# Patient Record
Sex: Female | Born: 1940
Health system: Southern US, Community
[De-identification: ages and names within clinical notes are randomized; demographics above are authoritative.]

## PROBLEM LIST (undated history)

## (undated) DIAGNOSIS — B029 Zoster without complications: Secondary | ICD-10-CM

## (undated) DIAGNOSIS — I509 Heart failure, unspecified: Secondary | ICD-10-CM

## (undated) DIAGNOSIS — Z5189 Encounter for other specified aftercare: Secondary | ICD-10-CM

## (undated) DIAGNOSIS — I872 Venous insufficiency (chronic) (peripheral): Secondary | ICD-10-CM

## (undated) DIAGNOSIS — M479 Spondylosis, unspecified: Secondary | ICD-10-CM

## (undated) DIAGNOSIS — I471 Supraventricular tachycardia: Secondary | ICD-10-CM

## (undated) DIAGNOSIS — D649 Anemia, unspecified: Secondary | ICD-10-CM

## (undated) DIAGNOSIS — I10 Essential (primary) hypertension: Secondary | ICD-10-CM

## (undated) DIAGNOSIS — F29 Unspecified psychosis not due to a substance or known physiological condition: Secondary | ICD-10-CM

## (undated) DIAGNOSIS — A159 Respiratory tuberculosis unspecified: Secondary | ICD-10-CM

## (undated) DIAGNOSIS — I4892 Unspecified atrial flutter: Secondary | ICD-10-CM

## (undated) DIAGNOSIS — N6009 Solitary cyst of unspecified breast: Secondary | ICD-10-CM

## (undated) DIAGNOSIS — Z780 Asymptomatic menopausal state: Secondary | ICD-10-CM

## (undated) DIAGNOSIS — R591 Generalized enlarged lymph nodes: Secondary | ICD-10-CM

## (undated) DIAGNOSIS — Z8619 Personal history of other infectious and parasitic diseases: Secondary | ICD-10-CM

## (undated) DIAGNOSIS — M549 Dorsalgia, unspecified: Secondary | ICD-10-CM

## (undated) DIAGNOSIS — D471 Chronic myeloproliferative disease: Secondary | ICD-10-CM

## (undated) DIAGNOSIS — E669 Obesity, unspecified: Secondary | ICD-10-CM

## (undated) HISTORY — DX: Asymptomatic menopausal state: Z78.0

## (undated) HISTORY — DX: Encounter for other specified aftercare: Z51.89

## (undated) HISTORY — DX: Generalized enlarged lymph nodes: R59.1

## (undated) HISTORY — DX: Venous insufficiency (chronic) (peripheral): I87.2

## (undated) HISTORY — DX: Dorsalgia, unspecified: M54.9

## (undated) HISTORY — DX: Unspecified psychosis not due to a substance or known physiological condition: F29

## (undated) HISTORY — DX: Obesity, unspecified: E66.9

## (undated) HISTORY — DX: Supraventricular tachycardia: I47.1

## (undated) HISTORY — DX: Solitary cyst of unspecified breast: N60.09

## (undated) HISTORY — PX: BREAST CYST ASPIRATION: SHX578

## (undated) HISTORY — DX: Essential (primary) hypertension: I10

## (undated) HISTORY — DX: Heart failure, unspecified: I50.9

## (undated) HISTORY — DX: Respiratory tuberculosis unspecified: A15.9

## (undated) HISTORY — PX: OTHER SURGICAL HISTORY: SHX169

## (undated) HISTORY — DX: Spondylosis, unspecified: M47.9

## (undated) HISTORY — DX: Personal history of other infectious and parasitic diseases: Z86.19

## (undated) HISTORY — DX: Zoster without complications: B02.9

---

## 1998-08-06 HISTORY — PX: OTHER SURGICAL HISTORY: SHX169

## 1998-12-16 ENCOUNTER — Other Ambulatory Visit: Admission: RE | Admit: 1998-12-16 | Discharge: 1999-01-02 | Payer: Self-pay

## 1999-01-24 ENCOUNTER — Encounter: Payer: Self-pay | Admitting: Internal Medicine

## 1999-01-24 ENCOUNTER — Ambulatory Visit (HOSPITAL_COMMUNITY): Admission: RE | Admit: 1999-01-24 | Discharge: 1999-01-24 | Payer: Self-pay | Admitting: Internal Medicine

## 1999-05-03 ENCOUNTER — Encounter: Payer: Self-pay | Admitting: Internal Medicine

## 1999-05-03 ENCOUNTER — Ambulatory Visit (HOSPITAL_COMMUNITY): Admission: RE | Admit: 1999-05-03 | Discharge: 1999-05-03 | Payer: Self-pay | Admitting: Internal Medicine

## 1999-05-18 ENCOUNTER — Emergency Department (HOSPITAL_COMMUNITY): Admission: EM | Admit: 1999-05-18 | Discharge: 1999-05-18 | Payer: Self-pay | Admitting: Emergency Medicine

## 1999-05-23 ENCOUNTER — Ambulatory Visit (HOSPITAL_COMMUNITY): Admission: RE | Admit: 1999-05-23 | Discharge: 1999-05-23 | Payer: Self-pay | Admitting: Internal Medicine

## 1999-07-25 ENCOUNTER — Ambulatory Visit (HOSPITAL_COMMUNITY): Admission: RE | Admit: 1999-07-25 | Discharge: 1999-07-25 | Payer: Self-pay | Admitting: *Deleted

## 1999-07-25 ENCOUNTER — Encounter (INDEPENDENT_AMBULATORY_CARE_PROVIDER_SITE_OTHER): Payer: Self-pay | Admitting: *Deleted

## 1999-07-25 ENCOUNTER — Encounter: Payer: Self-pay | Admitting: *Deleted

## 1999-07-28 ENCOUNTER — Encounter: Payer: Self-pay | Admitting: Neurological Surgery

## 1999-08-01 ENCOUNTER — Encounter: Payer: Self-pay | Admitting: Neurological Surgery

## 1999-08-01 ENCOUNTER — Encounter (INDEPENDENT_AMBULATORY_CARE_PROVIDER_SITE_OTHER): Payer: Self-pay | Admitting: *Deleted

## 1999-08-01 ENCOUNTER — Inpatient Hospital Stay (HOSPITAL_COMMUNITY): Admission: RE | Admit: 1999-08-01 | Discharge: 1999-08-09 | Payer: Self-pay | Admitting: Neurological Surgery

## 1999-08-01 DIAGNOSIS — A1801 Tuberculosis of spine: Secondary | ICD-10-CM | POA: Insufficient documentation

## 1999-09-24 ENCOUNTER — Encounter: Payer: Self-pay | Admitting: Neurological Surgery

## 1999-09-24 ENCOUNTER — Ambulatory Visit (HOSPITAL_COMMUNITY): Admission: RE | Admit: 1999-09-24 | Discharge: 1999-09-24 | Payer: Self-pay | Admitting: Neurological Surgery

## 1999-10-25 ENCOUNTER — Encounter: Admission: RE | Admit: 1999-10-25 | Discharge: 1999-10-25 | Payer: Self-pay | Admitting: Infectious Diseases

## 1999-11-20 ENCOUNTER — Encounter: Payer: Self-pay | Admitting: Infectious Diseases

## 1999-11-20 ENCOUNTER — Encounter: Admission: RE | Admit: 1999-11-20 | Discharge: 1999-11-20 | Payer: Self-pay | Admitting: Infectious Diseases

## 1999-11-22 ENCOUNTER — Encounter: Admission: RE | Admit: 1999-11-22 | Discharge: 1999-11-22 | Payer: Self-pay | Admitting: Infectious Diseases

## 1999-12-07 ENCOUNTER — Ambulatory Visit (HOSPITAL_COMMUNITY): Admission: RE | Admit: 1999-12-07 | Discharge: 1999-12-07 | Payer: Self-pay | Admitting: General Surgery

## 1999-12-07 ENCOUNTER — Encounter: Payer: Self-pay | Admitting: General Surgery

## 1999-12-20 ENCOUNTER — Encounter (INDEPENDENT_AMBULATORY_CARE_PROVIDER_SITE_OTHER): Payer: Self-pay | Admitting: *Deleted

## 1999-12-20 ENCOUNTER — Encounter: Payer: Self-pay | Admitting: General Surgery

## 1999-12-20 ENCOUNTER — Ambulatory Visit (HOSPITAL_COMMUNITY): Admission: RE | Admit: 1999-12-20 | Discharge: 1999-12-20 | Payer: Self-pay | Admitting: General Surgery

## 2000-04-28 ENCOUNTER — Encounter: Payer: Self-pay | Admitting: Neurological Surgery

## 2000-04-28 ENCOUNTER — Ambulatory Visit (HOSPITAL_COMMUNITY): Admission: RE | Admit: 2000-04-28 | Discharge: 2000-04-28 | Payer: Self-pay | Admitting: Neurological Surgery

## 2000-09-18 ENCOUNTER — Encounter: Payer: Self-pay | Admitting: Neurological Surgery

## 2000-09-18 ENCOUNTER — Ambulatory Visit (HOSPITAL_COMMUNITY): Admission: RE | Admit: 2000-09-18 | Discharge: 2000-09-18 | Payer: Self-pay | Admitting: Neurological Surgery

## 2000-11-18 ENCOUNTER — Encounter: Admission: RE | Admit: 2000-11-18 | Discharge: 2000-11-18 | Payer: Self-pay | Admitting: Infectious Diseases

## 2001-01-15 ENCOUNTER — Encounter: Admission: RE | Admit: 2001-01-15 | Discharge: 2001-01-15 | Payer: Self-pay | Admitting: Internal Medicine

## 2001-01-21 ENCOUNTER — Encounter: Admission: RE | Admit: 2001-01-21 | Discharge: 2001-01-21 | Payer: Self-pay | Admitting: Internal Medicine

## 2001-01-29 ENCOUNTER — Encounter: Admission: RE | Admit: 2001-01-29 | Discharge: 2001-01-29 | Payer: Self-pay | Admitting: Internal Medicine

## 2001-05-01 ENCOUNTER — Encounter: Admission: RE | Admit: 2001-05-01 | Discharge: 2001-05-01 | Payer: Self-pay | Admitting: Internal Medicine

## 2001-09-08 ENCOUNTER — Ambulatory Visit (HOSPITAL_COMMUNITY): Admission: RE | Admit: 2001-09-08 | Discharge: 2001-09-08 | Payer: Self-pay | Admitting: Internal Medicine

## 2001-09-08 ENCOUNTER — Encounter: Payer: Self-pay | Admitting: *Deleted

## 2001-09-08 ENCOUNTER — Encounter: Admission: RE | Admit: 2001-09-08 | Discharge: 2001-09-08 | Payer: Self-pay

## 2002-04-03 ENCOUNTER — Encounter: Admission: RE | Admit: 2002-04-03 | Discharge: 2002-04-03 | Payer: Self-pay | Admitting: Internal Medicine

## 2002-04-07 ENCOUNTER — Encounter: Admission: RE | Admit: 2002-04-07 | Discharge: 2002-04-07 | Payer: Self-pay | Admitting: Internal Medicine

## 2002-05-11 ENCOUNTER — Encounter: Admission: RE | Admit: 2002-05-11 | Discharge: 2002-05-11 | Payer: Self-pay | Admitting: Internal Medicine

## 2002-06-10 ENCOUNTER — Encounter: Admission: RE | Admit: 2002-06-10 | Discharge: 2002-09-08 | Payer: Self-pay | Admitting: *Deleted

## 2002-08-15 ENCOUNTER — Emergency Department (HOSPITAL_COMMUNITY): Admission: EM | Admit: 2002-08-15 | Discharge: 2002-08-16 | Payer: Self-pay

## 2002-08-19 ENCOUNTER — Emergency Department (HOSPITAL_COMMUNITY): Admission: EM | Admit: 2002-08-19 | Discharge: 2002-08-19 | Payer: Self-pay | Admitting: Emergency Medicine

## 2002-08-21 ENCOUNTER — Encounter: Admission: RE | Admit: 2002-08-21 | Discharge: 2002-08-21 | Payer: Self-pay | Admitting: Internal Medicine

## 2002-09-03 ENCOUNTER — Encounter: Admission: RE | Admit: 2002-09-03 | Discharge: 2002-09-03 | Payer: Self-pay | Admitting: Internal Medicine

## 2002-09-25 ENCOUNTER — Encounter: Admission: RE | Admit: 2002-09-25 | Discharge: 2002-09-25 | Payer: Self-pay | Admitting: Internal Medicine

## 2002-10-23 ENCOUNTER — Encounter: Admission: RE | Admit: 2002-10-23 | Discharge: 2002-10-23 | Payer: Self-pay | Admitting: Internal Medicine

## 2002-10-27 ENCOUNTER — Encounter: Admission: RE | Admit: 2002-10-27 | Discharge: 2002-10-27 | Payer: Self-pay | Admitting: Internal Medicine

## 2002-11-11 ENCOUNTER — Ambulatory Visit (HOSPITAL_COMMUNITY): Admission: RE | Admit: 2002-11-11 | Discharge: 2002-11-11 | Payer: Self-pay | Admitting: *Deleted

## 2002-11-23 ENCOUNTER — Encounter: Admission: RE | Admit: 2002-11-23 | Discharge: 2002-11-23 | Payer: Self-pay | Admitting: Internal Medicine

## 2002-12-30 ENCOUNTER — Encounter: Admission: RE | Admit: 2002-12-30 | Discharge: 2002-12-30 | Payer: Self-pay | Admitting: Internal Medicine

## 2003-01-06 ENCOUNTER — Encounter: Admission: RE | Admit: 2003-01-06 | Discharge: 2003-01-06 | Payer: Self-pay | Admitting: Internal Medicine

## 2003-04-15 ENCOUNTER — Encounter (INDEPENDENT_AMBULATORY_CARE_PROVIDER_SITE_OTHER): Payer: Self-pay | Admitting: Specialist

## 2003-04-15 ENCOUNTER — Ambulatory Visit (HOSPITAL_COMMUNITY): Admission: RE | Admit: 2003-04-15 | Discharge: 2003-04-15 | Payer: Self-pay | Admitting: Gastroenterology

## 2003-04-15 LAB — HM COLONOSCOPY

## 2003-06-14 ENCOUNTER — Encounter: Admission: RE | Admit: 2003-06-14 | Discharge: 2003-06-14 | Payer: Self-pay | Admitting: Internal Medicine

## 2003-08-19 ENCOUNTER — Encounter: Admission: RE | Admit: 2003-08-19 | Discharge: 2003-08-19 | Payer: Self-pay | Admitting: Internal Medicine

## 2003-10-26 ENCOUNTER — Encounter: Admission: RE | Admit: 2003-10-26 | Discharge: 2003-10-26 | Payer: Self-pay | Admitting: Internal Medicine

## 2003-11-16 ENCOUNTER — Encounter: Admission: RE | Admit: 2003-11-16 | Discharge: 2003-11-16 | Payer: Self-pay | Admitting: Internal Medicine

## 2003-12-08 ENCOUNTER — Ambulatory Visit (HOSPITAL_COMMUNITY): Admission: RE | Admit: 2003-12-08 | Discharge: 2003-12-08 | Payer: Self-pay

## 2004-03-02 ENCOUNTER — Ambulatory Visit (HOSPITAL_COMMUNITY): Admission: RE | Admit: 2004-03-02 | Discharge: 2004-03-02 | Payer: Self-pay | Admitting: Internal Medicine

## 2004-05-25 ENCOUNTER — Ambulatory Visit: Payer: Self-pay | Admitting: Internal Medicine

## 2005-04-11 ENCOUNTER — Ambulatory Visit: Payer: Self-pay | Admitting: Internal Medicine

## 2005-04-13 ENCOUNTER — Ambulatory Visit (HOSPITAL_COMMUNITY): Admission: RE | Admit: 2005-04-13 | Discharge: 2005-04-13 | Payer: Self-pay | Admitting: Internal Medicine

## 2005-06-13 ENCOUNTER — Ambulatory Visit: Payer: Self-pay | Admitting: Internal Medicine

## 2005-07-03 ENCOUNTER — Ambulatory Visit (HOSPITAL_COMMUNITY): Admission: RE | Admit: 2005-07-03 | Discharge: 2005-07-03 | Payer: Self-pay | Admitting: Internal Medicine

## 2005-07-03 ENCOUNTER — Ambulatory Visit: Payer: Self-pay | Admitting: Internal Medicine

## 2005-07-11 ENCOUNTER — Ambulatory Visit: Payer: Self-pay | Admitting: Internal Medicine

## 2006-03-01 ENCOUNTER — Ambulatory Visit: Payer: Self-pay | Admitting: Hospitalist

## 2006-03-06 ENCOUNTER — Ambulatory Visit: Payer: Self-pay | Admitting: Cardiology

## 2006-03-06 ENCOUNTER — Encounter: Payer: Self-pay | Admitting: Cardiology

## 2006-03-06 ENCOUNTER — Ambulatory Visit (HOSPITAL_COMMUNITY): Admission: RE | Admit: 2006-03-06 | Discharge: 2006-03-06 | Payer: Self-pay | Admitting: Internal Medicine

## 2006-05-27 ENCOUNTER — Ambulatory Visit: Payer: Self-pay | Admitting: Internal Medicine

## 2006-06-06 ENCOUNTER — Ambulatory Visit: Payer: Self-pay | Admitting: Internal Medicine

## 2006-06-17 ENCOUNTER — Ambulatory Visit: Payer: Self-pay | Admitting: Internal Medicine

## 2006-07-11 ENCOUNTER — Ambulatory Visit (HOSPITAL_COMMUNITY): Admission: RE | Admit: 2006-07-11 | Discharge: 2006-07-11 | Payer: Self-pay | Admitting: Internal Medicine

## 2006-07-11 ENCOUNTER — Ambulatory Visit: Payer: Self-pay | Admitting: Internal Medicine

## 2006-07-11 ENCOUNTER — Encounter (INDEPENDENT_AMBULATORY_CARE_PROVIDER_SITE_OTHER): Payer: Self-pay | Admitting: Infectious Diseases

## 2006-07-11 LAB — CONVERTED CEMR LAB
AST: 26 units/L (ref 0–37)
Albumin: 3.4 g/dL — ABNORMAL LOW (ref 3.5–5.2)
Alkaline Phosphatase: 97 units/L (ref 39–117)
BUN: 13 mg/dL (ref 6–23)
Chloride: 102 meq/L (ref 96–112)
Creatinine, Ser: 0.9 mg/dL (ref 0.40–1.20)
HCT: 37.1 % (ref 34.4–43.3)
Hemoglobin: 12.4 g/dL (ref 11.7–14.8)
MCV: 92.4 fL (ref 78.8–100.0)
Platelets: 212 10*3/uL (ref 152–374)
Potassium: 4.5 meq/L (ref 3.5–5.3)
Sodium: 136 meq/L (ref 135–145)
Total Protein: 6.9 g/dL (ref 6.0–8.3)
WBC: 4.5 10*3/uL (ref 3.7–10.0)

## 2006-07-18 ENCOUNTER — Ambulatory Visit: Payer: Self-pay | Admitting: *Deleted

## 2006-08-27 DIAGNOSIS — I10 Essential (primary) hypertension: Secondary | ICD-10-CM

## 2006-08-29 DIAGNOSIS — B029 Zoster without complications: Secondary | ICD-10-CM | POA: Insufficient documentation

## 2006-08-29 DIAGNOSIS — N6009 Solitary cyst of unspecified breast: Secondary | ICD-10-CM

## 2006-08-29 DIAGNOSIS — M549 Dorsalgia, unspecified: Secondary | ICD-10-CM | POA: Insufficient documentation

## 2006-09-03 ENCOUNTER — Telehealth: Payer: Self-pay | Admitting: *Deleted

## 2006-10-09 ENCOUNTER — Telehealth (INDEPENDENT_AMBULATORY_CARE_PROVIDER_SITE_OTHER): Payer: Self-pay | Admitting: *Deleted

## 2006-12-01 ENCOUNTER — Emergency Department (HOSPITAL_COMMUNITY): Admission: EM | Admit: 2006-12-01 | Discharge: 2006-12-01 | Payer: Self-pay | Admitting: Emergency Medicine

## 2006-12-05 ENCOUNTER — Ambulatory Visit: Payer: Self-pay | Admitting: Internal Medicine

## 2006-12-05 ENCOUNTER — Encounter (INDEPENDENT_AMBULATORY_CARE_PROVIDER_SITE_OTHER): Payer: Self-pay | Admitting: Unknown Physician Specialty

## 2006-12-05 LAB — CONVERTED CEMR LAB
Albumin: 3.9 g/dL (ref 3.5–5.2)
Alkaline Phosphatase: 83 units/L (ref 39–117)
BUN: 19 mg/dL (ref 6–23)
Cholesterol: 216 mg/dL — ABNORMAL HIGH (ref 0–200)
HDL: 63 mg/dL (ref >39)
Potassium: 4 meq/L (ref 3.5–5.3)
Sodium: 138 meq/L (ref 135–145)
Total Bilirubin: 0.4 mg/dL (ref 0.3–1.2)
Triglycerides: 78 mg/dL (ref <150)

## 2006-12-17 ENCOUNTER — Encounter (INDEPENDENT_AMBULATORY_CARE_PROVIDER_SITE_OTHER): Payer: Self-pay | Admitting: Internal Medicine

## 2006-12-17 ENCOUNTER — Encounter (INDEPENDENT_AMBULATORY_CARE_PROVIDER_SITE_OTHER): Payer: Self-pay | Admitting: Unknown Physician Specialty

## 2006-12-17 ENCOUNTER — Ambulatory Visit (HOSPITAL_COMMUNITY): Admission: RE | Admit: 2006-12-17 | Discharge: 2006-12-17 | Payer: Self-pay | Admitting: Obstetrics & Gynecology

## 2007-01-30 ENCOUNTER — Encounter (INDEPENDENT_AMBULATORY_CARE_PROVIDER_SITE_OTHER): Payer: Self-pay | Admitting: Unknown Physician Specialty

## 2007-01-30 ENCOUNTER — Ambulatory Visit: Payer: Self-pay | Admitting: Internal Medicine

## 2007-02-20 ENCOUNTER — Ambulatory Visit: Payer: Self-pay | Admitting: Internal Medicine

## 2007-02-20 DIAGNOSIS — I8310 Varicose veins of unspecified lower extremity with inflammation: Secondary | ICD-10-CM | POA: Insufficient documentation

## 2007-02-27 ENCOUNTER — Encounter (INDEPENDENT_AMBULATORY_CARE_PROVIDER_SITE_OTHER): Payer: Self-pay | Admitting: Internal Medicine

## 2007-02-27 ENCOUNTER — Encounter: Admission: RE | Admit: 2007-02-27 | Discharge: 2007-03-28 | Payer: Self-pay | Admitting: Internal Medicine

## 2007-04-11 ENCOUNTER — Telehealth: Payer: Self-pay | Admitting: *Deleted

## 2007-05-12 ENCOUNTER — Telehealth: Payer: Self-pay | Admitting: *Deleted

## 2007-05-16 ENCOUNTER — Emergency Department (HOSPITAL_COMMUNITY): Admission: EM | Admit: 2007-05-16 | Discharge: 2007-05-16 | Payer: Self-pay | Admitting: Emergency Medicine

## 2007-05-29 ENCOUNTER — Ambulatory Visit: Payer: Self-pay | Admitting: *Deleted

## 2007-05-29 DIAGNOSIS — M79609 Pain in unspecified limb: Secondary | ICD-10-CM

## 2007-07-16 ENCOUNTER — Telehealth: Payer: Self-pay | Admitting: *Deleted

## 2007-07-29 ENCOUNTER — Emergency Department (HOSPITAL_COMMUNITY): Admission: EM | Admit: 2007-07-29 | Discharge: 2007-07-29 | Payer: Self-pay | Admitting: Family Medicine

## 2007-07-29 ENCOUNTER — Telehealth (INDEPENDENT_AMBULATORY_CARE_PROVIDER_SITE_OTHER): Payer: Self-pay | Admitting: *Deleted

## 2007-08-04 ENCOUNTER — Ambulatory Visit: Payer: Self-pay | Admitting: Internal Medicine

## 2007-08-04 ENCOUNTER — Encounter (INDEPENDENT_AMBULATORY_CARE_PROVIDER_SITE_OTHER): Payer: Self-pay | Admitting: Internal Medicine

## 2007-08-04 DIAGNOSIS — E785 Hyperlipidemia, unspecified: Secondary | ICD-10-CM

## 2007-08-04 DIAGNOSIS — R599 Enlarged lymph nodes, unspecified: Secondary | ICD-10-CM | POA: Insufficient documentation

## 2007-08-14 LAB — CONVERTED CEMR LAB
Alkaline Phosphatase: 89 units/L (ref 39–117)
BUN: 12 mg/dL (ref 6–23)
CO2: 27 meq/L (ref 19–32)
Calcium: 9.4 mg/dL (ref 8.4–10.5)
Chloride: 102 meq/L (ref 96–112)
Creatinine, Ser: 0.86 mg/dL (ref 0.40–1.20)
Glucose, Bld: 87 mg/dL (ref 70–99)
LDL Cholesterol: 109 mg/dL — ABNORMAL HIGH (ref 0–99)
Potassium: 4.2 meq/L (ref 3.5–5.3)
Sodium: 141 meq/L (ref 135–145)
Total CHOL/HDL Ratio: 2.9
Total Protein: 7.5 g/dL (ref 6.0–8.3)

## 2007-09-04 ENCOUNTER — Ambulatory Visit (HOSPITAL_COMMUNITY): Admission: RE | Admit: 2007-09-04 | Discharge: 2007-09-04 | Payer: Self-pay | Admitting: Internal Medicine

## 2007-09-08 ENCOUNTER — Telehealth: Payer: Self-pay | Admitting: *Deleted

## 2007-11-24 ENCOUNTER — Telehealth (INDEPENDENT_AMBULATORY_CARE_PROVIDER_SITE_OTHER): Payer: Self-pay | Admitting: Internal Medicine

## 2007-12-08 ENCOUNTER — Ambulatory Visit: Payer: Self-pay | Admitting: Internal Medicine

## 2007-12-08 DIAGNOSIS — J069 Acute upper respiratory infection, unspecified: Secondary | ICD-10-CM | POA: Insufficient documentation

## 2007-12-12 ENCOUNTER — Emergency Department (HOSPITAL_COMMUNITY): Admission: EM | Admit: 2007-12-12 | Discharge: 2007-12-12 | Payer: Self-pay | Admitting: Emergency Medicine

## 2007-12-15 ENCOUNTER — Ambulatory Visit: Payer: Self-pay | Admitting: Internal Medicine

## 2007-12-15 ENCOUNTER — Encounter (INDEPENDENT_AMBULATORY_CARE_PROVIDER_SITE_OTHER): Payer: Self-pay | Admitting: Internal Medicine

## 2007-12-16 LAB — CONVERTED CEMR LAB
CO2: 30 meq/L (ref 19–32)
Calcium: 8.9 mg/dL (ref 8.4–10.5)
Creatinine, Ser: 0.86 mg/dL (ref 0.40–1.20)
Glucose, Bld: 82 mg/dL (ref 70–99)
Sodium: 142 meq/L (ref 135–145)

## 2008-01-05 DIAGNOSIS — I471 Supraventricular tachycardia: Secondary | ICD-10-CM

## 2008-01-05 HISTORY — DX: Supraventricular tachycardia: I47.1

## 2008-02-02 ENCOUNTER — Ambulatory Visit (HOSPITAL_COMMUNITY): Admission: RE | Admit: 2008-02-02 | Discharge: 2008-02-02 | Payer: Self-pay | Admitting: Internal Medicine

## 2008-02-02 ENCOUNTER — Encounter (INDEPENDENT_AMBULATORY_CARE_PROVIDER_SITE_OTHER): Payer: Self-pay | Admitting: Internal Medicine

## 2008-02-02 ENCOUNTER — Ambulatory Visit: Payer: Self-pay | Admitting: Internal Medicine

## 2008-02-02 DIAGNOSIS — R059 Cough, unspecified: Secondary | ICD-10-CM | POA: Insufficient documentation

## 2008-02-02 DIAGNOSIS — R05 Cough: Secondary | ICD-10-CM

## 2008-02-02 LAB — CONVERTED CEMR LAB
CO2: 29 meq/L (ref 19–32)
Creatinine, Ser: 0.94 mg/dL (ref 0.40–1.20)
HCT: 38.9 % (ref 36.0–46.0)
MCV: 93 fL (ref 78.0–100.0)
Monocytes Absolute: 0.6 10*3/uL (ref 0.1–1.0)
Neutrophils Relative %: 40 % — ABNORMAL LOW (ref 43–77)
Platelets: 175 10*3/uL (ref 150–400)
Potassium: 3.9 meq/L (ref 3.5–5.3)
Sodium: 138 meq/L (ref 135–145)

## 2008-02-03 ENCOUNTER — Inpatient Hospital Stay (HOSPITAL_COMMUNITY): Admission: EM | Admit: 2008-02-03 | Discharge: 2008-02-07 | Payer: Self-pay | Admitting: Family Medicine

## 2008-02-03 ENCOUNTER — Ambulatory Visit: Payer: Self-pay | Admitting: Internal Medicine

## 2008-02-05 ENCOUNTER — Encounter (INDEPENDENT_AMBULATORY_CARE_PROVIDER_SITE_OTHER): Payer: Self-pay | Admitting: Internal Medicine

## 2008-02-10 ENCOUNTER — Ambulatory Visit: Payer: Self-pay | Admitting: *Deleted

## 2008-02-10 DIAGNOSIS — J4540 Moderate persistent asthma, uncomplicated: Secondary | ICD-10-CM

## 2008-02-17 ENCOUNTER — Ambulatory Visit: Payer: Self-pay | Admitting: Internal Medicine

## 2008-02-17 ENCOUNTER — Encounter: Payer: Self-pay | Admitting: Emergency Medicine

## 2008-02-17 ENCOUNTER — Inpatient Hospital Stay (HOSPITAL_COMMUNITY): Admission: EM | Admit: 2008-02-17 | Discharge: 2008-02-21 | Payer: Self-pay | Admitting: Internal Medicine

## 2008-02-25 ENCOUNTER — Encounter (INDEPENDENT_AMBULATORY_CARE_PROVIDER_SITE_OTHER): Payer: Self-pay | Admitting: Internal Medicine

## 2008-02-26 ENCOUNTER — Encounter (INDEPENDENT_AMBULATORY_CARE_PROVIDER_SITE_OTHER): Payer: Self-pay | Admitting: Internal Medicine

## 2008-02-26 ENCOUNTER — Ambulatory Visit: Payer: Self-pay | Admitting: Internal Medicine

## 2008-02-26 ENCOUNTER — Ambulatory Visit (HOSPITAL_COMMUNITY): Admission: RE | Admit: 2008-02-26 | Discharge: 2008-02-26 | Payer: Self-pay | Admitting: Internal Medicine

## 2008-02-26 ENCOUNTER — Encounter (INDEPENDENT_AMBULATORY_CARE_PROVIDER_SITE_OTHER): Payer: Self-pay | Admitting: *Deleted

## 2008-02-26 DIAGNOSIS — E876 Hypokalemia: Secondary | ICD-10-CM

## 2008-02-26 DIAGNOSIS — R000 Tachycardia, unspecified: Secondary | ICD-10-CM

## 2008-02-26 LAB — CONVERTED CEMR LAB
BUN: 8 mg/dL (ref 6–23)
CO2: 28 meq/L (ref 19–32)
Chloride: 102 meq/L (ref 96–112)
Glucose, Bld: 83 mg/dL (ref 70–99)
Magnesium: 2.2 mg/dL (ref 1.5–2.5)
Sodium: 138 meq/L (ref 135–145)

## 2008-03-11 ENCOUNTER — Telehealth (INDEPENDENT_AMBULATORY_CARE_PROVIDER_SITE_OTHER): Payer: Self-pay | Admitting: Internal Medicine

## 2008-03-30 ENCOUNTER — Encounter (INDEPENDENT_AMBULATORY_CARE_PROVIDER_SITE_OTHER): Payer: Self-pay | Admitting: Internal Medicine

## 2008-03-30 ENCOUNTER — Ambulatory Visit: Payer: Self-pay | Admitting: *Deleted

## 2008-03-30 LAB — CONVERTED CEMR LAB
Calcium: 9 mg/dL (ref 8.4–10.5)
Chloride: 104 meq/L (ref 96–112)
Creatinine, Ser: 0.85 mg/dL (ref 0.40–1.20)
Magnesium: 2.1 mg/dL (ref 1.5–2.5)
Sodium: 142 meq/L (ref 135–145)

## 2008-07-27 ENCOUNTER — Telehealth (INDEPENDENT_AMBULATORY_CARE_PROVIDER_SITE_OTHER): Payer: Self-pay | Admitting: Internal Medicine

## 2008-09-08 ENCOUNTER — Encounter (INDEPENDENT_AMBULATORY_CARE_PROVIDER_SITE_OTHER): Payer: Self-pay | Admitting: Internal Medicine

## 2008-12-08 ENCOUNTER — Emergency Department (HOSPITAL_COMMUNITY): Admission: EM | Admit: 2008-12-08 | Discharge: 2008-12-08 | Payer: Self-pay | Admitting: Emergency Medicine

## 2008-12-27 ENCOUNTER — Ambulatory Visit: Payer: Self-pay | Admitting: *Deleted

## 2008-12-27 ENCOUNTER — Encounter (INDEPENDENT_AMBULATORY_CARE_PROVIDER_SITE_OTHER): Payer: Self-pay | Admitting: Internal Medicine

## 2008-12-27 LAB — CONVERTED CEMR LAB
Albumin: 3.5 g/dL (ref 3.5–5.2)
BUN: 14 mg/dL (ref 6–23)
Chloride: 103 meq/L (ref 96–112)
Free T4: 0.93 ng/dL (ref 0.80–1.80)
GFR calc Af Amer: >60 mL/min (ref >60)
Glucose, Bld: 83 mg/dL (ref 70–99)
HCT: 38.1 % (ref 36.0–46.0)
Hemoglobin: 12.2 g/dL (ref 12.0–15.0)
MCHC: 32 g/dL (ref 30.0–36.0)
Platelets: 281 10*3/uL (ref 150–400)
Potassium: 4.2 meq/L (ref 3.5–5.3)
TSH: 1.263 microintl units/mL (ref 0.350–4.500)

## 2009-01-10 ENCOUNTER — Encounter (INDEPENDENT_AMBULATORY_CARE_PROVIDER_SITE_OTHER): Payer: Self-pay | Admitting: Internal Medicine

## 2009-01-10 ENCOUNTER — Ambulatory Visit: Payer: Self-pay | Admitting: Internal Medicine

## 2009-01-11 LAB — CONVERTED CEMR LAB
Cholesterol: 148 mg/dL (ref 0–200)
Triglycerides: 58 mg/dL (ref <150)
VLDL: 12 mg/dL (ref 0–40)

## 2009-01-17 ENCOUNTER — Telehealth: Payer: Self-pay | Admitting: *Deleted

## 2009-01-18 ENCOUNTER — Ambulatory Visit: Payer: Self-pay | Admitting: Internal Medicine

## 2009-01-18 ENCOUNTER — Telehealth: Payer: Self-pay | Admitting: *Deleted

## 2009-01-18 ENCOUNTER — Encounter: Payer: Self-pay | Admitting: Internal Medicine

## 2009-01-18 DIAGNOSIS — J45901 Unspecified asthma with (acute) exacerbation: Secondary | ICD-10-CM | POA: Insufficient documentation

## 2009-01-21 ENCOUNTER — Ambulatory Visit: Payer: Self-pay | Admitting: Internal Medicine

## 2009-01-21 ENCOUNTER — Encounter (INDEPENDENT_AMBULATORY_CARE_PROVIDER_SITE_OTHER): Payer: Self-pay | Admitting: Internal Medicine

## 2009-01-22 ENCOUNTER — Inpatient Hospital Stay (HOSPITAL_COMMUNITY): Admission: EM | Admit: 2009-01-22 | Discharge: 2009-01-24 | Payer: Self-pay | Admitting: Infectious Diseases

## 2009-01-22 ENCOUNTER — Ambulatory Visit: Payer: Self-pay | Admitting: Infectious Diseases

## 2009-01-22 ENCOUNTER — Encounter: Payer: Self-pay | Admitting: Internal Medicine

## 2009-01-22 ENCOUNTER — Encounter: Payer: Self-pay | Admitting: Emergency Medicine

## 2009-01-24 ENCOUNTER — Encounter: Payer: Self-pay | Admitting: Internal Medicine

## 2009-02-08 ENCOUNTER — Ambulatory Visit: Payer: Self-pay | Admitting: Infectious Diseases

## 2009-02-15 ENCOUNTER — Ambulatory Visit (HOSPITAL_COMMUNITY): Admission: RE | Admit: 2009-02-15 | Discharge: 2009-02-15 | Payer: Self-pay | Admitting: Internal Medicine

## 2009-02-15 LAB — HM MAMMOGRAPHY: HM Mammogram: NEGATIVE

## 2009-04-01 ENCOUNTER — Ambulatory Visit: Payer: Self-pay | Admitting: Internal Medicine

## 2009-04-03 DIAGNOSIS — D721 Eosinophilia: Secondary | ICD-10-CM

## 2009-04-03 LAB — CONVERTED CEMR LAB
Alkaline Phosphatase: 106 units/L (ref 39–117)
Basophils Relative: 1 % (ref 0–1)
Eosinophils Relative: 14 % — ABNORMAL HIGH (ref 0–5)
Glucose, Bld: 91 mg/dL (ref 70–99)
Lymphocytes Relative: 27 % (ref 12–46)
Lymphs Abs: 1.5 10*3/uL (ref 0.7–4.0)
MCHC: 31.7 g/dL (ref 30.0–36.0)
Monocytes Absolute: 0.6 10*3/uL (ref 0.1–1.0)
Monocytes Relative: 11 % (ref 3–12)
Neutrophils Relative %: 47 % (ref 43–77)
Potassium: 3.7 meq/L (ref 3.5–5.3)
Sodium: 141 meq/L (ref 135–145)

## 2009-08-19 ENCOUNTER — Telehealth: Payer: Self-pay | Admitting: Internal Medicine

## 2010-01-29 LAB — HM PAP SMEAR: HM Pap smear: NEGATIVE

## 2010-02-23 ENCOUNTER — Telehealth: Payer: Self-pay | Admitting: Internal Medicine

## 2010-04-26 ENCOUNTER — Telehealth: Payer: Self-pay | Admitting: Internal Medicine

## 2010-05-04 ENCOUNTER — Ambulatory Visit: Payer: Self-pay | Admitting: Internal Medicine

## 2010-05-04 ENCOUNTER — Encounter: Payer: Self-pay | Admitting: *Deleted

## 2010-06-02 ENCOUNTER — Telehealth: Payer: Self-pay | Admitting: Internal Medicine

## 2010-09-05 NOTE — Assessment & Plan Note (Signed)
Summary: EST-CK/FU/MEDS/CFB   Vital Signs:  Patient profile:   70 year old female Height:      66 inches Weight:      310.7 pounds BMI:     50.33 Temp:     97.0 degrees F oral Pulse rate:   77 / minute BP sitting:   142 / 88  (right arm)  Vitals Entered By: Filomena Jungling NT II (May 04, 2010 9:59 AM) CC: checkup ? about medication Is Patient Diabetic? No Pain Assessment Patient in pain? no      Nutritional Status BMI of > 30 = obese  Have you ever been in a relationship where you felt threatened, hurt or afraid?No   Does patient need assistance? Functional Status Self care Ambulation Impaired:Risk for fall Comments walks    Primary Care Provider:  Blondell Reveal MD  CC:  checkup ? about medication.  History of Present Illness: Erika [name] is a 70 year old Female with PMH as outlined in the EMR comes to the office for a regular office visit.  1. Patient reports that she is"doing pretty good".   2. She denies any problems currently.  3. Reports that the lipitor causes "cramps".She tried to take it atdifferent timings including bed time but it is still causing cramps, hence she stopped taking it in the last 2 months.      Preventive Screening-Counseling & Management  Alcohol-Tobacco     Smoking Status: never  Caffeine-Diet-Exercise     Does Patient Exercise: yes     Type of exercise: water areobics  Problems Prior to Update: 1)  Eosinophilia  (ICD-288.3) 2)  Asthma  (ICD-493.90) 3)  Stasis Dermatitis  (ICD-454.1) 4)  Hyperlipidemia  (ICD-272.4) 5)  Obesity  (ICD-278.00) 6)  Hypertension  (ICD-401.9) 7)  Asthma, With Acute Exacerbation  (ICD-493.92) 8)  Hypokalemia  (ICD-276.8) 9)  Tachycardia  (ICD-785.0) 10)  Psychosis  (ICD-298.9) 11)  Cough  (ICD-786.2) 12)  Uri  (ICD-465.9) 13)  Tb of Vertebral Column-cult Dx  (ICD-015.04) 14)  Mediastinal Lymphadenopathy  (ICD-785.6) 15)  Foot Pain, Left  (ICD-729.5) 16)  Age At Menopause  (ICD-627.2) 17)   Shingles, Recurrent  (ICD-053.9) 18)  Breast Cyst, Benign  (ICD-610.0) 19)  Back Pain  (ICD-724.5) 20)  Follow-up Examination, Vaginal Pap Smear  (ICD-V67.01)  Medications Prior to Update: 1)  Furosemide 40 Mg Tabs (Furosemide) .... Take 1 Tablet By Mouth Once A Day 2)  Albuterol 90 Mcg/act  Aers (Albuterol) .... Inhale 1-2 Puffs Every 4 Hours As Needed For Shortness of Breath, Cough or Wheezing 3)  Lipitor 20 Mg Tabs (Atorvastatin Calcium) .... Take 1 Tablet By Mouth Once A Day 4)  Klor-Con 20 Meq  Pack (Potassium Chloride) .... Take 1 Tablet By Mouth Two Times A Day 5)  Omeprazole 20 Mg  Cpdr (Omeprazole) .... Take 1 Tablet By Mouth Once A Day 6)  Amlodipine Besylate 10 Mg Tabs (Amlodipine Besylate) .... Take One Tab Daily 7)  Afrin Nasal Spray 0.05 % Soln (Oxymetazoline Hcl) .... Spray Once in Each Nare Two Times A Day As Needed For Sinus Congestion - Do Not Use For More Than 5 Days 8)  Advair Diskus 250-50 Mcg/dose Misc (Fluticasone-Salmeterol) .[name].. 1 Puff Twice Daily As Instructed 9)  Atenolol 25 Mg Tabs (Atenolol) .... Take 1 Tablet By Mouth Once A Day 10)  Doxycycline Hyclate 100 Mg Caps (Doxycycline Hyclate) .... Take 1 Tablet By Mouth Two Times A Day For Ten Days 11)  Hydroxyzine Hcl 25 Mg Tabs (  Hydroxyzine Hcl) .... Take 1 Tablet By Mouth Four Times A Day For Itching  Current Medications (verified): 1)  Furosemide 40 Mg Tabs (Furosemide) .... Take 1 Tablet By Mouth Once A Day 2)  Albuterol 90 Mcg/act  Aers (Albuterol) .... Inhale 1-2 Puffs Every 4 Hours As Needed For Shortness of Breath, Cough or Wheezing 3)  Klor-Con 20 Meq  Pack (Potassium Chloride) .... Take 1 Tablet By Mouth Once A Day 4)  Omeprazole 20 Mg  Cpdr (Omeprazole) .... Take 1 Tablet By Mouth Once A Day 5)  Amlodipine Besylate 10 Mg Tabs (Amlodipine Besylate) .... Take One Tab Daily 6)  Advair Diskus 250-50 Mcg/dose Misc (Fluticasone-Salmeterol) .[name].. 1 Puff Twice Daily As Instructed  Allergies (verified): 1)  !  Prednisone  Social History: Does Patient Exercise:  yes  Review of Systems      See HPI  Physical Exam  General:  alert, well-developed, and well-nourished.   Head:  normocephalic and atraumatic.   Eyes:  vision grossly intact.   Neck:  supple and full ROM.   Lungs:  normal respiratory effort, no intercostal retractions, no accessory muscle use, and normal breath sounds.   Heart:  normal rate, regular rhythm, no murmur, and no gallop.   Extremities:  1+ left pedal edema and 1+ right pedal edema.   Neurologic:  alert & oriented X3, cranial nerves II-XII intact, and strength normal in all extremities.     Impression & Recommendations:  Problem # 1:  ASTHMA (ICD-493.90) Currently well controlled. Continue current regimen.  Her updated medication list for this problem includes:    Albuterol 90 Mcg/act Aers (Albuterol) ..... Inhale 1-2 puffs every 4 hours as needed for shortness of breath, cough or wheezing    Advair Diskus 250-50 Mcg/dose Misc (Fluticasone-salmeterol) .[name]... 1 puff twice daily as instructed  Problem # 2:  HYPERLIPIDEMIA (ICD-272.4) Patient reports that she has been having "cramps" every time she takes Lipitor. I gave her few options in regards to less potent statins but patient prefers to control with diet and life style changes. Will stop Lipitor.  The following medications were removed from the medication list:    Lipitor 20 Mg Tabs (Atorvastatin calcium) .[name]... Take 1 tablet by mouth once a day  Problem # 3:  HYPERTENSION (ICD-401.9)  Blood pressure fairly well controlled. Patient hasn't been taking Lasix for a few months now. Given her pedal edema, asked her to restart Lasix along with KCL.  The following medications were removed from the medication list:    Atenolol 25 Mg Tabs (Atenolol) .[name]... Take 1 tablet by mouth once a day Her updated medication list for this problem includes:    Furosemide 40 Mg Tabs (Furosemide) .[name]... Take 1 tablet by mouth once a day     Amlodipine Besylate 10 Mg Tabs (Amlodipine besylate) .[name]... Take one tab daily  BP today: 142/88 Prior BP: 140/95 (04/01/2009)  Prior 10 Yr Risk Heart Disease: Not enough information (12/05/2006)  Labs Reviewed: K+: 3.7 (04/01/2009) Creat: : 1.05 (04/01/2009)   Chol: 148 (01/10/2009)   HDL: 69 (01/10/2009)   LDL: 67 (01/10/2009)   TG: 58 (01/10/2009)  Problem # 4:  Preventive Health Care (ICD-V70.0) Patient reports having a colonoscopy in about 3 yrs ago.  Complete Medication List: 1)  Furosemide 40 Mg Tabs (Furosemide) .... Take 1 tablet by mouth once a day 2)  Albuterol 90 Mcg/act Aers (Albuterol) .... Inhale 1-2 puffs every 4 hours as needed for shortness of breath, cough or wheezing 3)  Klor-con 20 Meq Pack (Potassium chloride) .... Take 1 tablet by mouth once a day 4)  Omeprazole 20 Mg Cpdr (Omeprazole) .... Take 1 tablet by mouth once a day 5)  Amlodipine Besylate 10 Mg Tabs (Amlodipine besylate) .... Take one tab daily 6)  Advair Diskus 250-50 Mcg/dose Misc (Fluticasone-salmeterol) .[name].. 1 puff twice daily as instructed  Patient Instructions: 1)  Please schedule a follow-up appointment in 2 months. Prescriptions: FUROSEMIDE 40 MG TABS (FUROSEMIDE) Take 1 tablet by mouth once a day  #31 Tablet x 3   Entered and Authorized by:   Blondell Reveal MD   Signed by:   Blondell Reveal MD on 05/04/2010   Method used:   Electronically to        [facility] (retail)       [address]       [place], [place]  [id number]       Ph: [phone]       Fax: [phone]   RxID:   [id number] KLOR-CON 20 MEQ  PACK (POTASSIUM CHLORIDE) Take 1 tablet by mouth once a day  #30 x 3   Entered and Authorized by:   Blondell Reveal MD   Signed by:   Blondell Reveal MD on 05/04/2010   Method used:   Electronically to        [facility] (retail)       [address]       [place], [place]  [id number]       Ph: [phone]       Fax: [phone]    RxID:   [id number]   Prevention & Chronic Care Immunizations   Influenza vaccine: Fluvax MCR  (08/04/2007)    Tetanus booster: Not documented    Pneumococcal vaccine: Pneumovax  (02/04/2008)   Pneumococcal vaccine deferral: Not indicated  (02/08/2009)    H. zoster vaccine: Not documented  Colorectal Screening   Hemoccult: Not documented    Colonoscopy: Results: Polyp.  Pathology:  Adenomatous polyp.          (04/15/2003)  Other Screening   Pap smear: Not documented    Mammogram: ASSESSMENT: Negative - BI-RADS 1^MM DIGITAL SCREENING  (02/15/2009)   Mammogram action/deferral: Ordered  (02/08/2009)    DXA bone density scan: Lumbar Spine:  T Score > -1.0 Spine.  Hip Total: T Score > -1.0 Hip.    (12/17/2006)   DXA scan due: 12/2008    Smoking status: never  (05/04/2010)  Lipids   Total Cholesterol: 148  (01/10/2009)   LDL: 67  (01/10/2009)   LDL Direct: Not documented   HDL: 69  (01/10/2009)   Triglycerides: 58  (01/10/2009)    SGOT (AST): 16  (04/01/2009)   SGPT (ALT): 11  (04/01/2009)   Alkaline phosphatase: 106  (04/01/2009)   Total bilirubin: 0.4  (04/01/2009)  Hypertension   Last Blood Pressure: 142 / 88  (05/04/2010)   Serum creatinine: 1.05  (04/01/2009)   BMP action: Ordered   Serum potassium 3.7  (04/01/2009)  Self-Management Support :    Patient will work on the following items until the next clinic visit to reach self-care goals:     Medications and monitoring: take my medicines every day  (05/04/2010)     Eating: eat more vegetables, eat foods that are low in salt, eat baked foods instead of fried foods, eat fruit for snacks and desserts  (05/04/2010)     Activity: join a walking program  (05/04/2010)  Other: water arobics  (05/04/2010)    Hypertension self-management support: Education handout  (05/04/2010)   Hypertension education handout printed    Lipid self-management support: Education handout  (05/04/2010)     Lipid education  handout printed

## 2010-09-05 NOTE — Miscellaneous (Signed)
Summary: Orders Update  Clinical Lists Changes  Orders: Added new Service order of Flu Vaccine 72yrs + MEDICARE PATIENTS (Z6109) - Signed Added new Service order of Administration Flu vaccine - MCR (U0454) - Signed Observations: Added new observation of FLU VAXMFR: [employer] (05/04/2010 11:30) Added new observation of FLU VAXLOT: [id number] (05/04/2010 11:30) Added new observation of FLU VAX VIS: 02/28/2010 version (05/04/2010 11:30) Added new observation of FLU VAX EXP: 02/03/2011 (05/04/2010 11:30) Added new observation of FLU VAX DSE: 0.37ml (05/04/2010 11:30) Added new observation of FLU VAX: Fluvax 3+ (05/04/2010 11:30) Flu Vaccine Consent Questions     Do you have a history of severe allergic reactions to this vaccine? no    Any prior history of allergic reactions to egg and/or gelatin? no    Do you have a sensitivity to the preservative Thimersol? no    Do you have a past history of Guillan-Barre Syndrome? no    Do you currently have an acute febrile illness? no    Have you ever had a severe reaction to latex? no    Vaccine information given and explained to patient? yes    Are you currently pregnant? no    Lot Number:[id number]   Exp Date:02/03/2011   Manufacturer: [employer]    Site Given  Left Deltoid IM  Orders Added: 1)  Flu Vaccine 68yrs + MEDICARE PATIENTS [Q2039] 2)  Administration Flu vaccine - MCR [G0008]  .medflu

## 2010-09-05 NOTE — Progress Notes (Signed)
Summary: Refill/gh  Phone Note Refill Request Message from:  Fax from Pharmacy on February 23, 2010 4:55 PM  Refills Requested: Medication #1:  ADVAIR DISKUS 250-50 MCG/DOSE MISC 1 puff twice daily as instructed   Last Refilled: 11/09/2009  Method Requested: Electronic Initial call taken by: Angelina Ok RN,  February 23, 2010 4:55 PM  Follow-up for Phone Call       Follow-up by: Blondell Reveal MD,  February 25, 2010 5:43 PM    Prescriptions: ADVAIR DISKUS 250-50 MCG/DOSE MISC (FLUTICASONE-SALMETEROL) 1 puff twice daily as instructed  #1 x 6   Entered and Authorized by:   Blondell Reveal MD   Signed by:   Blondell Reveal MD on 02/25/2010   Method used:   Electronically to        [facility] (retail)       [address]       [place], [place]  [id number]       Ph: [phone]       Fax: [phone]   RxID:   [id number]

## 2010-09-05 NOTE — Progress Notes (Signed)
Summary: refill/gg  Phone Note Refill Request  on April 26, 2010 12:24 PM  Refills Requested: Medication #1:  LIPITOR 20 MG TABS Take 1 tablet by mouth once a day   Last Refilled: 03/15/2010  Method Requested: Electronic Initial call taken by: Merrie Roof RN,  April 26, 2010 12:25 PM  Follow-up for Phone Call        Refill approved-nurse to complete Follow-up by: Julaine Fusi  DO,  April 28, 2010 5:03 PM    Prescriptions: LIPITOR 20 MG TABS (ATORVASTATIN CALCIUM) Take 1 tablet by mouth once a day  #30 Tablet x 4   Entered by:   Julaine Fusi  DO   Authorized by:   Blondell Reveal MD   Signed by:   Julaine Fusi  DO on 04/28/2010   Method used:   Electronically to        [facility] (retail)       [address]       [place], [place]  [id number]       Ph: [phone]       Fax: [phone]   RxID:   [id number]

## 2010-09-05 NOTE — Progress Notes (Signed)
Summary: med refill/gp  Phone Note Refill Request Message from:  Fax from Pharmacy on June 02, 2010 10:31 AM  Refills Requested: Medication #1:  OMEPRAZOLE 20 MG  CPDR Take 1 tablet by mouth once a day   Last Refilled: 04/22/2010 Last appt. 05/04/10.   Method Requested: Electronic Initial call taken by: Chinita Pester RN,  June 02, 2010 10:31 AM  Follow-up for Phone Call       Follow-up by: Blondell Reveal MD,  June 02, 2010 5:15 PM    Prescriptions: OMEPRAZOLE 20 MG  CPDR (OMEPRAZOLE) Take 1 tablet by mouth once a day  #30 Capsule x 4   Entered and Authorized by:   Blondell Reveal MD   Signed by:   Blondell Reveal MD on 06/02/2010   Method used:   Electronically to        [facility] (retail)       [address]       [place], [place]  [id number]       Ph: [phone]       Fax: [phone]   RxID:   [id number]

## 2010-09-05 NOTE — Progress Notes (Signed)
Summary: med refill/gp  Phone Note Refill Request Message from:  Fax from Pharmacy on August 19, 2009 11:16 AM  Refills Requested: Medication #1:  ATENOLOL 25 MG TABS Take 1 tablet by mouth once a day   Last Refilled: 07/14/2009  Method Requested: Electronic Initial call taken by: Chinita Pester RN,  August 19, 2009 11:16 AM  Follow-up for Phone Call       Follow-up by: Blondell Reveal MD,  August 19, 2009 12:39 PM    Prescriptions: ATENOLOL 25 MG TABS (ATENOLOL) Take 1 tablet by mouth once a day  #30 x 5   Entered and Authorized by:   Blondell Reveal MD   Signed by:   Blondell Reveal MD on 08/19/2009   Method used:   Electronically to        [facility] (retail)       [address]       [place], [place]  [id number]       Ph: [phone]       Fax: [phone]   RxID:   [id number]

## 2010-09-11 ENCOUNTER — Ambulatory Visit (INDEPENDENT_AMBULATORY_CARE_PROVIDER_SITE_OTHER): Payer: MEDICARE | Admitting: Internal Medicine

## 2010-09-11 ENCOUNTER — Encounter: Payer: Self-pay | Admitting: Internal Medicine

## 2010-09-11 ENCOUNTER — Ambulatory Visit: Payer: Self-pay | Admitting: Internal Medicine

## 2010-09-11 ENCOUNTER — Other Ambulatory Visit: Payer: Self-pay | Admitting: Internal Medicine

## 2010-09-11 ENCOUNTER — Telehealth: Payer: Self-pay | Admitting: *Deleted

## 2010-09-11 DIAGNOSIS — I10 Essential (primary) hypertension: Secondary | ICD-10-CM

## 2010-09-11 MED ORDER — TRIAMTERENE-HCTZ 75-50 MG PO TABS: ORAL_TABLET | ORAL | Status: DC

## 2010-09-11 NOTE — Assessment & Plan Note (Signed)
Discontinue furosemide  Trial of triamterene-hctz 75-50 1/2 tab a day  Patient advised to stop kcl  Supplements as well

## 2010-09-11 NOTE — Progress Notes (Signed)
  Subjective:    Patient ID: Erika [name], female    DOB: 06/22/1941, 71 y.o.   MRN: [id number]  HPI  Pt here fu for problems listed. Notes some occasions of sinus difficulties, takes otc med with good relief No associated fevers.   Not taking furosemide regularly b/c of cramping in hands. Similar sx's hctz but has not tried hctz\triamterene.  Was going to [facility] up until one month ago water aerobics, but slacked off. She promises me she will return to her workouts tomorrow!    Review of Systems  Constitutional: Negative for fever, activity change, fatigue and unexpected weight change.  HENT: Positive for congestion (episodes of pm congestion-no feather pillow, air vents other triggers). Negative for rhinorrhea, sneezing and postnasal drip.   Eyes: Negative for discharge and itching.  Respiratory: Negative for cough and chest tightness.   Musculoskeletal: Positive for back pain (hx of spinal TB with post infectious arthritis-stable).       Objective:   Physical Exam  Constitutional: She appears well-developed and well-nourished.  HENT:  Head: Normocephalic and atraumatic.  Eyes: Conjunctivae and EOM are normal. Pupils are equal, round, and reactive to light.  Neck: Normal range of motion. Neck supple. No tracheal deviation present. No thyromegaly present.  Cardiovascular: Normal rate, regular rhythm, normal heart sounds and intact distal pulses.   Pulmonary/Chest: Effort normal. No respiratory distress. She has no wheezes. She has no rales. She exhibits no tenderness.  Abdominal: Soft. Bowel sounds are normal.  Skin: Skin is warm. No rash noted. No erythema.  Psychiatric: She has a normal mood and affect. Her behavior is normal.          Assessment & Plan:

## 2010-09-11 NOTE — Telephone Encounter (Signed)
Accidentally opened in error.

## 2010-09-12 LAB — COMPREHENSIVE METABOLIC PANEL
AST: 19 U/L (ref 0–37)
Albumin: 3.7 g/dL (ref 3.5–5.2)
BUN: 13 mg/dL (ref 6–23)
CO2: 28 mEq/L (ref 19–32)
Chloride: 104 mEq/L (ref 96–112)
Creat: 0.82 mg/dL (ref 0.40–1.20)
Glucose, Bld: 81 mg/dL (ref 70–99)
Potassium: 4 mEq/L (ref 3.5–5.3)
Total Bilirubin: 0.4 mg/dL (ref 0.3–1.2)

## 2010-10-03 ENCOUNTER — Other Ambulatory Visit: Payer: Self-pay | Admitting: *Deleted

## 2010-10-04 MED ORDER — AMLODIPINE BESYLATE 10 MG PO TABS: 10.0000 mg | ORAL_TABLET | Freq: Every day | ORAL | Status: DC

## 2010-10-10 ENCOUNTER — Encounter: Payer: Self-pay | Admitting: Internal Medicine

## 2010-10-10 ENCOUNTER — Ambulatory Visit (INDEPENDENT_AMBULATORY_CARE_PROVIDER_SITE_OTHER): Payer: MEDICARE | Admitting: Internal Medicine

## 2010-10-10 DIAGNOSIS — E785 Hyperlipidemia, unspecified: Secondary | ICD-10-CM

## 2010-10-10 DIAGNOSIS — I10 Essential (primary) hypertension: Secondary | ICD-10-CM

## 2010-10-10 DIAGNOSIS — J45909 Unspecified asthma, uncomplicated: Secondary | ICD-10-CM

## 2010-10-10 DIAGNOSIS — Z0000 Encounter for general adult medical examination without abnormal findings: Secondary | ICD-10-CM

## 2010-10-10 DIAGNOSIS — Z1231 Encounter for screening mammogram for malignant neoplasm of breast: Secondary | ICD-10-CM

## 2010-10-10 LAB — BASIC METABOLIC PANEL
Chloride: 102 mEq/L (ref 96–112)
Potassium: 3.9 mEq/L (ref 3.5–5.3)
Sodium: 138 mEq/L (ref 135–145)

## 2010-10-10 LAB — LIPID PANEL
HDL: 68 mg/dL (ref >39)
LDL Cholesterol: 112 mg/dL — ABNORMAL HIGH (ref 0–99)
VLDL: 16 mg/dL (ref 0–40)

## 2010-10-10 NOTE — Assessment & Plan Note (Signed)
Stable  Continue current regimen  

## 2010-10-10 NOTE — Assessment & Plan Note (Signed)
At goal as of last checked 2 yrs ago. Will recheck FLP today and reassess.

## 2010-10-10 NOTE — Patient Instructions (Signed)
Make follow up appointment in 2 months. Continue taking all medication as directed. You will be called with any abnormalities in the tests scheduled or performed today.  If you don't hear from [place] within a week from when the test was performed, you can assume that your test was normal. Go get Mammogram and Pap smear done.

## 2010-10-10 NOTE — Progress Notes (Signed)
  Subjective:    Patient ID: Erika [name], female    DOB: 29-Apr-1941, 70 y.o.   MRN: [id number]  HPI  70 yr old woman  Past Medical History  Diagnosis Date  . Hypertension   . Tuberculosis     active TB treated in 2002  . Obesity   . Menopause   . Breast cyst   . Shingles   . Asthma   . SVT (supraventricular tachycardia) June 2009    one run while hospitalized  . Back pain     status post surgery 2002   comes to the clinic for hypertension management. Reports to have started taking norvasc 10mg  just a couple of days ago. She is taking maxzide 1/2 tablet po daily. Patient has no complains.    Review of Systems  [all other systems reviewed and are negative       Objective:   Physical Exam  [vitalsreviewed. Constitutional: She appears well-developed and well-nourished.  HENT:  Head: Normocephalic and atraumatic.  Eyes: Conjunctivae and EOM are normal. Pupils are equal, round, and reactive to light.  Neck: Normal range of motion. Neck supple. No tracheal deviation present. No thyromegaly present.  Cardiovascular: Normal rate, regular rhythm, normal heart sounds and intact distal pulses.   Pulmonary/Chest: Effort normal. No respiratory distress. She has no wheezes. She has no rales. She exhibits no tenderness.  Abdominal: Soft. Bowel sounds are normal.  Skin: Skin is warm. No rash noted. No erythema.  Psychiatric: She has a normal mood and affect. Her behavior is normal.          Assessment & Plan:

## 2010-10-10 NOTE — Assessment & Plan Note (Signed)
At goal. Will continue current regimen. Check bmet as diuretic was changed 1 month ago.

## 2010-10-10 NOTE — Assessment & Plan Note (Signed)
Pap smear and Mammogram ordered today. Will get records.

## 2010-10-31 ENCOUNTER — Other Ambulatory Visit: Payer: Self-pay | Admitting: *Deleted

## 2010-11-01 MED ORDER — OMEPRAZOLE 20 MG PO CPDR: 20.0000 mg | DELAYED_RELEASE_CAPSULE | Freq: Every day | ORAL | Status: DC

## 2010-11-13 LAB — GLUCOSE, CAPILLARY
Glucose-Capillary: 133 mg/dL — ABNORMAL HIGH (ref 70–99)
Glucose-Capillary: 137 mg/dL — ABNORMAL HIGH (ref 70–99)
Glucose-Capillary: 141 mg/dL — ABNORMAL HIGH (ref 70–99)

## 2010-11-13 LAB — BASIC METABOLIC PANEL
BUN: 11 mg/dL (ref 6–23)
BUN: 14 mg/dL (ref 6–23)
CO2: 31 mEq/L (ref 19–32)
Chloride: 93 mEq/L — ABNORMAL LOW (ref 96–112)
Chloride: 99 mEq/L (ref 96–112)
Creatinine, Ser: 0.78 mg/dL (ref 0.4–1.2)
GFR calc Af Amer: >60 mL/min (ref >60)
GFR calc non Af Amer: >60 mL/min (ref >60)
GFR calc non Af Amer: >60 mL/min (ref >60)
Glucose, Bld: 126 mg/dL — ABNORMAL HIGH (ref 70–99)
Potassium: 3.5 mEq/L (ref 3.5–5.1)
Potassium: 3.9 mEq/L (ref 3.5–5.1)

## 2010-11-13 LAB — BLOOD GAS, ARTERIAL
Drawn by: 308601
FIO2: 0.21 %
O2 Saturation: 92.1 %
Patient temperature: 98.6
pH, Arterial: 7.396 (ref 7.350–7.400)
pO2, Arterial: 63.9 mmHg — ABNORMAL LOW (ref 80.0–100.0)

## 2010-11-13 LAB — POCT I-STAT, CHEM 8
BUN: 14 mg/dL (ref 6–23)
Calcium, Ion: 0.99 mmol/L — ABNORMAL LOW (ref 1.12–1.32)
Chloride: 100 meq/L (ref 96–112)
Creatinine, Ser: 0.8 mg/dL (ref 0.4–1.2)
Glucose, Bld: 116 mg/dL — ABNORMAL HIGH (ref 70–99)
HCT: 40 % (ref 36.0–46.0)
Hemoglobin: 13.6 g/dL (ref 12.0–15.0)
Potassium: 3.5 mEq/L (ref 3.5–5.1)
Sodium: 135 meq/L (ref 135–145)
TCO2: 29 mmol/L (ref 0–100)

## 2010-11-13 LAB — URINALYSIS, ROUTINE W REFLEX MICROSCOPIC
Glucose, UA: NEGATIVE mg/dL
Ketones, ur: NEGATIVE mg/dL
Protein, ur: NEGATIVE mg/dL
pH: 5.5 (ref 5.0–8.0)

## 2010-11-13 LAB — MAGNESIUM: Magnesium: 2.3 mg/dL (ref 1.5–2.5)

## 2010-11-13 LAB — DIFFERENTIAL
Basophils Absolute: 0 10*3/uL (ref 0.0–0.1)
Eosinophils Relative: 18 % — ABNORMAL HIGH (ref 0–5)
Lymphocytes Relative: 17 % (ref 12–46)
Neutro Abs: 4.8 10*3/uL (ref 1.7–7.7)
Neutrophils Relative %: 58 % (ref 43–77)

## 2010-11-13 LAB — EXPECTORATED SPUTUM ASSESSMENT W GRAM STAIN, RFLX TO RESP C

## 2010-11-13 LAB — CULTURE, RESPIRATORY W GRAM STAIN: Culture: NORMAL

## 2010-11-13 LAB — POCT CARDIAC MARKERS
CKMB, poc: 3 ng/mL (ref 1.0–8.0)
Myoglobin, poc: 225 ng/mL (ref 12–200)
Troponin i, poc: <0.05 ng/mL (ref 0.00–0.09)

## 2010-11-13 LAB — CBC
HCT: 38.6 % (ref 36.0–46.0)
HCT: 39.1 % (ref 36.0–46.0)
MCHC: 33 g/dL (ref 30.0–36.0)
MCV: 93.7 fL (ref 78.0–100.0)
MCV: 93.8 fL (ref 78.0–100.0)
Platelets: 195 10*3/uL (ref 150–400)
Platelets: 189 10*3/uL (ref 150–400)
Platelets: 198 10*3/uL (ref 150–400)
RBC: 3.98 MIL/uL (ref 3.87–5.11)
RDW: 14.6 % (ref 11.5–15.5)
RDW: 15 % (ref 11.5–15.5)
WBC: 11.9 10*3/uL — ABNORMAL HIGH (ref 4.0–10.5)

## 2010-11-13 LAB — D-DIMER, QUANTITATIVE: D-Dimer, Quant: 0.29 ug/mL-FEU (ref 0.00–0.48)

## 2010-11-13 LAB — CARDIAC PANEL(CRET KIN+CKTOT+MB+TROPI)
CK, MB: 5.4 ng/mL — ABNORMAL HIGH (ref 0.3–4.0)
Total CK: 226 U/L — ABNORMAL HIGH (ref 7–177)
Total CK: 261 U/L — ABNORMAL HIGH (ref 7–177)
Troponin I: 0.02 ng/mL (ref 0.00–0.06)
Troponin I: 0.02 ng/mL (ref 0.00–0.06)

## 2010-11-13 LAB — HEPATIC FUNCTION PANEL
Bilirubin, Direct: <0.1 mg/dL (ref 0.0–0.3)
Total Bilirubin: 0.6 mg/dL (ref 0.3–1.2)

## 2010-11-13 LAB — BRAIN NATRIURETIC PEPTIDE: Pro B Natriuretic peptide (BNP): <30 pg/mL (ref 0.0–100.0)

## 2010-11-25 ENCOUNTER — Encounter: Payer: Self-pay | Admitting: Ophthalmology

## 2010-12-19 NOTE — Consult Note (Signed)
[name], Erika [name] NO.:  [id number]   MEDICAL RECORD NO.:  [id number]          PATIENT TYPE:  INP   LOCATION:  4730                         FACILITY:  [facility]   PHYSICIAN:  [name], M.D.  DATE OF BIRTH:  10/02/1940   DATE OF CONSULTATION:  02/19/2008  DATE OF DISCHARGE:                                 CONSULTATION   REQUESTING PHYSICIAN:  Alvester Morin, M.D.   REASON FOR CONSULTATION:  Manic symptoms.   HISTORY OF PRESENT ILLNESS:  Erika [name] is a 70 year old female  admitted to [facility] on February 17, 2008, with  psychosis and agitation.   The patient has had approximately 4 days of progressive elevated and  expansive mood, starting with feeling great, spreading love to the  point of severe insomnia increased energy, paranoia, agitation and  combativeness.  The patient was swinging her cane and threatening.   An hour prior to the undersigned's visit, the patient was given Haldol 2  mg IM.  She is calmer at this time.  She still has paranoia and anxiety.   Most recently, the patient required a prednisone taper in early July for  an asthmatic exacerbation.  The taper started at 60 mg daily.   PAST PSYCHIATRIC HISTORY:  The undersigned reviewed the past medical  record.  The only psychotropic discovered was Valium utilized in  December 2000.   The undersigned interviewed the patient's son.  He does not know of any  prior history of elevated mood or agitation, psychosis, depression or  any other mental disorders.  He also does not know of any psychotropic  treatment or psychiatric treatment.   FAMILY PSYCHIATRIC HISTORY:  He states that he is in recovery from  substance addiction (the patient's son).   SOCIAL HISTORY:  The patient is from [place], [place].  Her  son lives with her.  She does not use alcohol or illegal drugs.  She  does attend church.  She has three grown children and is widowed.  Occupation:  Retired.   PAST MEDICAL HISTORY:  1. Asthma.  2. Hypertension.  3. Hyperlipidemia.  4. History of tuberculosis in the spine.   MEDICATIONS:  The MAR is reviewed.  The patient is on Haldol 2 mg q.4 h  p.r.n.   ALLERGIES:  No known drug allergies.   LABORATORY DATA:  Sodium 138, BUN 12, creatinine 0.9, glucose 113.  TSH,  B12, urine drug screen, folic acid, magnesium, alcohol all within normal  limits.   WBC 6.9, hemoglobin 11, platelet count 183.   Head CT without contrast showed no acute abnormalities.   EKG:  QTC showed 482 milliseconds.   REVIEW OF SYSTEMS:  Constitutional, head, eyes, ears, nose, throat,  mouth neurologic, psychiatric, cardiovascular, respiratory,  gastrointestinal, genitourinary, skin, musculoskeletal, hematologic,  lymphatic, endocrine, metabolic all unremarkable.   PHYSICAL EXAMINATION:  VITALS:  Temperature 97.6, pulse 99, respiratory  rate 18, blood pressure 107/64, O2 saturation on room air 99%.  GENERAL APPEARANCE:  Erika [name] is an elderly female lying in her  hospital bed but no abnormal  involuntary movements.   MENTAL STATUS EXAM:  Erika [name] is alert.  Her eye contact is good.  She is oriented to all spheres.  Her affect is anxious.  Her mood is  anxious.  Her memory is grossly intact to immediate, recent and remote.  Her fund of knowledge and intelligence are within normal limits.  Her  speech is mildly pressured.  Thought process is coherent, although there  is some illogical thought content, paranoia, no evidence of  hallucinations at this time.  No thoughts of harming herself or others.  Insight is poor.  Judgment is impaired.   ASSESSMENT:  AXIS I:  1.  293.83 mood disorder not otherwise specified.  2.  293.81 psychotic disorder not otherwise specified.   These could have been precipitated by the prednisone taper.  Literature  has supported that the future risk of mental disorders secondary to a  prednisone taper  cannot be predicted based upon previous associated  episodes.   This finding was discussed with the son in regards to any future need  for prednisone and whether or not the patient would want to have  preventative antipsychotic treatment when prednisone is needed.   AXIS II:  Deferred  AXIS III:  See past medical history.  AXIS IV:  General medical.  AXIS V:  20.   Please see the discussion above.  The undersigned did provide education  to the son and ego supportive psychotherapy at a low stimulation level  with the patient.   RECOMMENDATIONS:  1. Haldol 10 mg p.o. IM or IV slow push b.i.d. standing.  Would      recheck the patient's EKG QTC and discontinue      Haldol if greater than 500 milliseconds.  2. Ativan 1-2 mg p.o. IM or IV q.4 h p.r.n.  Would reserve the Haldol      p.r.n. for severe agitation, not responding to the Ativan.      [name], M.D.  Electronically Signed     JW/MEDQ  D:  02/19/2008  T:  02/19/2008  Job:  [id number]

## 2010-12-19 NOTE — Discharge Summary (Signed)
Erika [name], ARVIN NO.:  [id number]   MEDICAL RECORD NO.:  [id number]          PATIENT TYPE:  INP   LOCATION:  5502                         FACILITY:  [facility]   PHYSICIAN:  Alvester Morin, M.D.  DATE OF BIRTH:  [birth year]   DATE OF ADMISSION:  02/03/2008  DATE OF DISCHARGE:  02/07/2008                               DISCHARGE SUMMARY   DISCHARGE DIAGNOSES:  1. Asthma exacerbation  2. Tachycardia secondary to combination of albuterol nebulizations and      deconditioning.  3. T-wave inversions in V2-V3 and V4, resolved with negative cardiac      enzymes and a single run of supraventricular tachycardia also      resolved.  4. Hypertension, though well controlled on hydrochlorothiazide.  5. History of hyperlipidemia, though well-controlled with an LDL of 80      and an HDL of 66.  6. History of tuberculosis of the spine, treated with a full course.      She will be seen by the outpatient clinic.  7. Hypokalemia, resolved, most likely secondary to heavy albuterol      use.  8. Dyspnea with an ambulatory O2 sat of 72%.  That resolved to an      ambulatory O2 sat of 92% at the time of discharge.   DISCHARGE MEDICATIONS:  1. The new medicine Flovent 88 mcg 1 puff twice a day.  2. Prednisone 60 mg by mouth once a day on February 08, 2008, and then to      undertake a prednisone taper of 50, then 40, then 30, then 20, then      10, then stop.  3. Albuterol 1-2 puffs once every 4 hours as needed for shortness of      breath.  4. Hydrochlorothiazide 25 mg by mouth once a day.  5. Lipitor 20 mg by mouth once a day.  6. Klor-Con 20 mEq by mouth once a day.  7. Tussionex 5 mL once every 12 hours as needed for cough.  8. Omeprazole 20 mg by mouth once a day.  9. She was told to specifically stop taking her atenolol.   DISPOSITION AND FOLLOW UP:  [name] is going to follow-up with Dr.  Rufina [name] in the outpatient clinic on February 10, 2008, at 9:30 in the  morning.   At this time, he is to evaluate whether she has had continued  resolution of her respiratory symptoms, whether she has been able to  acquire her Flovent, prednisone and to provide assistance with her  acquiring those medications.  This is the first time that she has been  put on a control medication such as Flovent, so hopefully this will  control her asthma and prevent her from coming to the hospital with  asthma exacerbations.   There were no procedures performed during this hospitalization.  No  consultations were obtained.   BRIEF HISTORY AND PHYSICAL:  [name] is a 70 year old female  with a history of morbid obesity and late onset asthma that developed  when she was 5, who presented to the emergency  room for several  episodes of shortness of breath in the past 2 weeks that began while  sitting in a hot car.  She noticed that her chest began to feel tight.  The symptoms were made worse by going into air conditioned areas and  then back into heat.  This was [place] was at the beach and 2 weeks  prior to admission.  The shortness of breath persisted and has been  getting worse progressively as the days have gone on.  It has been  associated with a cough that is sometimes productive and the cough  worsens her shortness of breath.  She is also waking up at night because  of this cough.  She has been using albuterol up to q.1 hour with no  relief.  She came to the outpatient clinic on February 02, 2008, given nebs  and improved and left.  Then, she came to the [facility] and the  nebs did not improve.  She had ambulatory sats of 85%, prompting her to  be admitted.   PHYSICAL EXAMINATION:  VITAL SIGNS:  On admission, temperature was 97.1,  blood pressure 129/80, pulse 92, respiratory rate 16.  O2 sat 98% on 2  liters.  GENERAL:  She was alert and oriented x3 on a nebulizer.  There was loud  wheezing heard when entering the room.  HEENT:  Eyes; pupils were equal, round  and reactive to light.  Sclerae  clear.  She had moist mucosa and a white film over her tongue with a  clear oropharynx.  NECK:  Supple.  She had diffuse loud wheezes bilaterally with rhonchi in  the mid fields.  CARDIOVASCULAR:  Distant.  Regular rate and rhythm.  No  murmurs, rubs or gallops.  GI:  Bowel sounds positive.  Soft, nontender with a question of a  central mass.  EXTREMITIES:  2+ edema bilaterally.  1+ pulses.  SKIN:  Dry and warm.  There was no lymphadenopathy.  Strength was 5/5.  NEURO:  Cranial nerves II-XII intact.  Neuro exam nonfocal.  PSYCH:  Appropriate.   LABORATORY DATA:  Initial labs:  Sodium 136, potassium 3.3, chloride 97,  bicarb 32, BUN 13, creatinine 1.1, glucose 95, hemoglobin 12.7,  hematocrit 38, white blood cell count 7, platelets 177.  Chest x-ray  showed COPD, with no acute abnormality.  For more details, please refer  to the initial H&P.   HOSPITAL COURSE:  1. Asthma exacerbation.  Given [name]'s late onset of asthma and      her persistent worsening of her asthma, an extensive workup has      been undertaken and to review it, she has had a CT angio that has      shown no evidence of chronic pulmonary emboli.  She had an ACE      inhibitor level checked that was negative.  She had a partial      immune workup, including an ANA that was negative.  Her ESR was 62.      A P-ANCA was checked with the results still pending at the time of      this dictation.  Based on the negative workup and also with      resolution of her lymph nodes that was seen on previous CT scans,      again results in January.  We presume that this is likely to be      regular asthma.  Also of note, when she first  came in, she had a      remarkably elevated eosinophil count, raising the suspicion that      she may have allergic bronchopulmonary aspergillosis.  Again, the      eosinophil percentage was 31% and absolute eosinophil count 2.2.      However, when given one  dose of steroids, her eosinophil count went      to 0.0, would not be expected in that condition.  She responded      slowly, but she did respond to nebulizers and IV steroids,      clinically improving much faster than her physical exam did.  Again      saying that she was feeling great, even while walking, though of      note was that her ambulatory O2 sat initially was 77%, and she      continued to have very loud wheezing.  During the course of her      hospitalization, her ambulatory status did improve.  Again, towards      her discharge, she was able to ambulate with an O2 sat of 92%,      though her heart rate did rise up to 120 during this and probably      secondary to deconditioning.  The decision was made that she should      start on a controller medication, namely Flovent to see if that can      prevent her from having exacerbations in the future.  If that is      ineffective, perhaps Advair can be tried.  Again, a skin test for      allergic bronchopulmonary aspergillosis can be considered, but it      was not investigated during this hospitalization, again because her      eosinophil count went to 0 with one course of steroids.  We will      have to follow-up to make sure that she is able to continue to      obtain her Flovent.  2. Tachycardia and EKG changes.  These EKG changes resolved with      treatment of her asthma exacerbation and decreasing the amount of      nebulizers that she needed.  Cardiac enzymes cycled and negative,      and this is something that can be followed as an outpatient.  With      her morbid obesity and her age, she is at risk for heart disease,      but this can be worked up as an outpatient whether she needs to be      risk-stratified.  Again, her cholesterol was under good control.  3. Hypertension, well controlled on hydrochlorothiazide alone.  I      would avoid of beta-blocker given her asthma.  I would not resume      atenolol in this  patient.  4. Hypokalemia.  This did require some repletion during the      hospitalization.  She was continued on her potassium upon      discharge.   DISCHARGE LABS AND VITALS:  Her angiotensin-converting enzyme was 39.  She had a urine culture that grew over 100,000 Klebsiella, but again she  had no symptoms associated with this urine and it was pansensitive with  intermediate resistance to nitrofurantoin, so she was not treated.  If  she develops symptoms associated with a urinary tract infection, this  can be addressed as an outpatient.  Sodium 135,  potassium 4.4, chloride  97, bicarb 31, glucose 112, BUN 18, creatinine 0.91, calcium 9.3, white  blood cell count 9.4, hemoglobin 12.5, hematocrit 36.8.  She had an ANC  of 8.4, this was on steroids and an MCV of 92.8.  ANA was negative.  Discharge vitals:  Temperature 97.4, pulse while ambulating got as high  as 150, but quickly went down to the 120s, and then with rest was down  to 79.  Blood pressure 125-151/62-97.  O2 sat 90-95% on room air.      Valetta Close, M.D.  Electronically Signed      Alvester Morin, M.D.  Electronically Signed    JC/MEDQ  D:  02/10/2008  T:  02/10/2008  Job:  [id number]   cc:   Outpatient Clinic

## 2010-12-19 NOTE — Discharge Summary (Signed)
Erika [name], Erika [name]               ACCOUNT NO.:  [id number]   MEDICAL RECORD NO.:  [id number]          PATIENT TYPE:  INP   LOCATION:  5509                         FACILITY:  [facility]   PHYSICIAN:  Mick Sell, MD DATE OF BIRTH:  1940-10-30   DATE OF ADMISSION:  01/22/2009  DATE OF DISCHARGE:  01/24/2009                               DISCHARGE SUMMARY   DISCHARGE DIAGNOSES:  1. Bronchial asthma exacerbation, to finish prednisone taper, starting      maintenance therapy with Advair, to follow up with Outpatient      Clinic.  2. Hypertension.  3. Moderate obesity.  4. Previously treated tuberculosis in 2002.  5. History of shingles.  6. History of lumbar paraspinal epidural tuberculosis.  7. Bronchial asthma, history of exacerbations in July 2009.  8. Hiatal hernia.  9. 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  10.Colonoscopy in 2004 showing small 3 mm polyp in the descending      colon and internal hemorrhoids.  11.History of epidural mass, from L4 to sacrum with left lumbar      radiculopathy, status post hemilaminectomy of L4, L5, and S1      compression and hemilaminectomy of L4, L5, and S1, decompression of      tumor in the epidural space.  12.History of breast cyst.  13.History of back pain status post surgery in 2002.  14.Family history of cerebrovascular accident at young age.  15.Sinus tachycardia, with heart rates to 100-110, in the setting of      bronchial asthma exacerbation and frequent albuterol nebulizations,      to follow up with Outpatient Clinic, [facility].   DISCHARGE MEDICATIONS:  1. Hydrochlorothiazide 25 mg one pill once a day.  2. Albuterol 90 mcg 1-2 puffs every 4 hours as needed for shortness of      breath, cough, and wheezing.  3. Lipitor 20 mg one pill once a day.  4. Potassium chloride 20 mEq one pill 2 times a day.  5. Omeprazole 20 mg one pill once a day.  6. Amlodipine 10 mg one pill once a day.  7. Oxymetazoline 0.05% solution one spray in each nostril 2 times a      day as needed for sinus congestion.  8. Advair 250/50 mcg one puff twice daily as instructed.  9. Prednisone 10 mg five pills on June 22, four pills on June 23,      three pills on June 24, two pills on June 25, one pill on June 26,      and then stop.  10  Mucinex 600 mg one pill twice a day.   DISPOSITION AND FOLLOWUP:  The patient is to follow up with Outpatient  Clinic, [facility], with Dr. Comer Locket on February 08, 2009 at 2:50  p.m.  At the time of hospital follow up, please make sure her breathing  has improved and finished her prednisone course.  Also make sure the  patient is taking Advair for the asthma maintenance therapy.  Also, make  sure that the sinus tachycardia has resolved.   PROCEDURES PERFORMED:  ABG, pH 7.39, PCO2 52.6, PO2 63.9, bicarb 31.6.  Point of care markers negative except for myoglobin of 225.  Cardiac  enzymes x2 are negative.  BNP is less than 30, PT is 13.5, INR is 1.0.  Urinalysis is negative for nitrites and leukocyte esterase, D-dimer is  0.29.  Chest x-ray, no acute cardiopulmonary abnormalities.  An x-ray of  the neck, there is a vague density in the posterior hypopharynx consider  visual inspection if clinically warranted.  The epiglottis appear  normal.   CONSULTATIONS:  None.   BRIEF ADMITTING HISTORY AND PHYSICAL:  A 70 year old lady with past  medical history of hypertension, bronchial asthma, previously treated  tuberculosis was admitted from the [facility] Emergency Department.  The patient presented to the [facility] with chief complaints  of worsening shortness of breath.  The patient's complaint started 1  week ago insidious in onset, progressive and gradually getting worse.  The patient denies any fevers, chills, runny nose, sore throat, body  pain, chest pain, diaphoresis, palpitations, dizziness, nausea,   vomiting, diarrhea, constipation, abdominal pain, or cold-like illness.  She reports that she had some exposure to smoking while she was walking  outside and not sure it that had triggered this episode.  Reports that  her asthma is not bad and that she gets asthma spells may be once a week  or sometimes once a month and does not use albuterol regularly.  She  denies being intubated for asthma exacerbations.  The patient was seen  in the Outpatient Clinic on June 15th by Dr. Sherryll Burger for similar complaints  and was given Solu-Medrol 80 mg in the Outpatient Clinic and albuterol  nebulizations and was sent home on prednisone 40 mg x1 with a close  follow up.  The patient was again seen in the Outpatient Clinic on June  18 by Dr. Elizebeth Koller and apparently she was doing much better on 18th and  hence no changes in inhalers or the treatment was made and was advised  to follow up in 2 weeks.   PHYSICAL EXAMINATION:  VITAL SIGNS:  Temperature 97.6, pulse rate of  126, blood pressure 132/56, oxygen saturation 98% on room air.  GENERAL:  The patient was not in any acute distress.  The patient is  moderately obese, wearing nasal canula, but talking in full sentences.  HEENT:  Pupils equal, round, and reactive to light.  Extraocular  movements are intact.  No anemia.  No jaundice.  Moist mucous membranes  bilaterally, mild to moderate tonsillar enlargement.  No visible pus  noted.  No erythema.  RESPIRATORY:  Good air entry bilaterally.  Diffuse bilateral expiratory  wheezing.  No crackles or rhonchi.  CVS:  S1, S2 sinus tachy.  Regular rate and rhythm.  No murmurs.  No  rubs.  No gallops.  GI:  Abdomen is obese, soft, nondistended, nontender.  No guarding.  No  rigidity.  Bowel sounds are positive.  EXTREMITIES:  Trace to 1+ pitting edema bilaterally equal.  Erika [name]' is  negative.  CNS:  Patient is alert and oriented x3.  Nonfocal.   CBC on the day of admission, white count 8.4, hemoglobin 12.6,  platelet  count 194.  BMET, sodium 131, potassium 3.5, chloride 93, bicarb 31,  glucose 126, BUN 11, creatinine 0.77, calcium 8.7.   HOSPITAL COURSE BY PROBLEM:  1. Bronchial asthma exacerbation.  The patient was initially  admitted      to the step-down unit and was started on Solu-Medrol IV 80 mg 3      times a day along with albuterol and Atrovent nebulizations.  Given      her frequent asthma exacerbations, the next day, we started her on      maintenance therapy with Advair along with prednisone taper.  Given      her history of psychosis while she was on prednisone taper in the      past, we wanted to quickly taper her prednisone by 10 mg every day.      The patient is to finish prednisone taper as mentioned in the      discharge instructions and is to take Advair daily and albuterol as      needed.  The patient is to follow up with Dr. Comer Locket on July 6 at      2:50 p.m.  The patient on the day of discharge is breathing      normally back to her baseline and is not in any acute distress.  2. Hypertension.  The patient's blood pressure is fairly well      controlled during the entire hospitalization.  The patient is to      continue her home medications.  3. Sinus tachycardia.  The patient is apparently looking back at her      office records.  The patient's heart rate was in between 100-110      during the most recent office visits during which the patient was      having bronchial asthma exacerbation and also during this      hospitalization the patient's heart rate has been between 90s to      110.  Given her asthma exacerbation during this admission and also      given her frequent nebulizations during this admission, we want to      monitor the vital signs as an outpatient.  The patient is to follow      up with the Outpatient Clinic, [facility] on July 6 and      Dr. Comer Locket is to address her heart rate at time of hospital      followup.   DISCHARGE VITALS:   Temperature 97.2, pulse rate of 102, respiratory rate  18, blood pressure 146/96, oxygen saturation 94% on room air.   DISCHARGE LABS:  White count 11.9, hemoglobin 12.5, platelet count 189.  BMET, sodium 139, potassium 3.9, chloride 99, bicarb 32, glucose 110,  BUN 14, creatinine 0.78, calcium 9.4.  The patient on the day of  discharge is alert and oriented x3 and is not in any acute distress.  The patient is to take all the medications mentioned in the discharge  instructions and is to follow up in the Outpatient Clinic, [facility] on the days mentioned above.      Blondell Reveal, MD  Electronically Signed      Mick Sell, MD  Electronically Signed    VB/MEDQ  D:  01/24/2009  T:  01/25/2009  Job:  [id number]   cc:   [facility]

## 2010-12-19 NOTE — Discharge Summary (Signed)
Erika [name], Erika [name]               ACCOUNT NO.:  [id number]   MEDICAL RECORD NO.:  [id number]          PATIENT TYPE:  INP   LOCATION:  4730                         FACILITY:  [facility]   PHYSICIAN:  Alvester Morin, M.D.  DATE OF BIRTH:  [birth year]   DATE OF ADMISSION:  02/17/2008  DATE OF DISCHARGE:  02/21/2008                               DISCHARGE SUMMARY   DISCHARGE DIAGNOSES:  1. Altered mental status, thought to be secondary to prednisone taper      several days before admission, resolved on discharge with 2 mg of      Haldol b.i.d.  2. Hypokalemia, thought to be secondary to hydrochlorothiazide      diuretics.  The patient was discharged on 40 mEq of potassium twice      daily.  3. Asthma.  The patient will be using albuterol p.r.n. on an      outpatient basis.  4. Hypertension.  The patient was discharged on hydrochlorothiazide 25      mg.  Blood pressure was stable during admission.  The patient is      also on amlodipine 5 mg daily.  5. Hyperlipidemia.  The patient on Lipitor 20 mg.   DISCHARGE MEDICATIONS:  1. Omeprazole 20 mg daily.  2. Hydrochlorothiazide 25 mg daily.  3. Haldol 2 mg b.i.d.  4. Lipitor 20 mg daily.  5. Albuterol inhaler as needed.  6. Potassium supplements 20 mEq twice a day.  7. Amlodipine 5 mg once daily.  8. Slow-Mag 1 tablet twice daily for 7 days.   DISPOSITION AND FOLLOWUP:  The patient has an appointment on February 26, 2008, to see Dr. Rufina Falco in the [facility].  She will require an EKG and BMET, also requires just  to check on her mood and if she can be tapered off her Haldol.  The  basic metabolic panel is to check for hypokalemia.  EKG is to check for  any EKG changes with the Haldol as well as low potassium.   PROCEDURES PERFORMED:  The patient had a noncontrast head CT in the  Emergency Department on the night of February 17, 2008, after admission.  CT  demonstrated no acute intracranial  abnormality.   BRIEF ADMITTING HISTORY AND PHYSICAL:  On initial presentation, the  patient was unable to provide her own history.  However, the history  from the emergency department at [facility] is provided.  The  patient is a 70 year old female with a past medical history significant  for asthma whose family reports a gradual onset of the patient becoming  acutely agitated the day prior to admission and becoming significantly  worse on the day of admission.  The patient has recently finished a  steroid taper for an asthma exacerbation with improvement of her asthma.  It was later discovered on consultation with her family that the patient  had begun to behave slightly different starting on Saturday, February 14, 2008, which is roughly 4 days prior to admission.  She was demonstrating  increased talkativeness, increased energy, decreased sleep, grandiose  thoughts, as well as excessive religious beliefs calling her friends in  the middle of the night to talk about her church.  She did not  demonstrate any weakness, numbness, nausea, or vomiting.  She also  denied fevers and chills.   ALLERGIES:  The patient has no known allergies.   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   MEDICATIONS ON ADMISSION:  1. Flovent 88 mcg.  2. Albuterol p.r.n.  3. Hydrochlorothiazide 25 mg.  4. Lipitor 20 mg.  5. Potassium 20 mEq.  6. Omeprazole.   PHYSICAL EXAMINATION:  GENERAL:  Her temperature was 97.7, blood  pressure 112/69, pulse 99, respirations 20, O2 saturation of 100% on  room air.  Generally, she was in no apparent distress.  HEENT:  Extraocular eye movements were intact without injected  conjunctiva.  Mucous membranes were moist.  There was no erythema of the  throat.  RESPIRATORY:  Clear to auscultation bilaterally with good air movement.   CARDIOVASCULAR:  Regular rate and rhythm.  Normal S1 and S2.  No  murmurs, rubs, or gallops.  GI:  The abdomen was obese, soft, and nontender.  Positive bowel sounds.  EXTREMITIES:  There is mild 1+ edema bilaterally in the extremities.  NEUROLOGICAL:  The patient is alert and oriented x3.  Cranial nerves II-  XII are intact.  A 5/5 strength bilaterally in the upper and lower  extremities.  Fine touch sensation was intact.  Normal gait.  However,  the patient was feeling tired while walking and somewhat unsteady in her  quick turning movements.   ADMISSION LABS:  Sodium 136, potassium 3.0, chloride 97, bicarb 31, BUN  14, creatinine 1.09, glucose 109, white blood cell count 6.9, hemoglobin  12.2, hematocrit 36.8, platelets 202, MCV 92.1, bilirubin 1.1, alk phos  83, AST 22, ALT 23, protein 6.8, albumin 3.2, and calcium 9.0.   HOSPITAL COURSE BY PROBLEM:  1. Altered mental status.  The patient presented after her family was      concerned for some behavioral changes, which included increased      energy, increased talkativeness, decreased sleep, and some      agitation and irritation.  This began on February 14, 2008, four days      prior to admission, and the patient progressively got worse until      her admission.  She had a negative head CT making an acute bleed      unlikely.  The patient rechecked a TSH, which came back normal at      0.843.  RPR was negative.  The patient demonstrated no focal or      neurological deficits on exam on her admission and that remained      consistent throughout her hospitalization.  However, when the      patient was interviewed on February 18, 2008, during the day, she      demonstrated flight of ideas, elevated mood, increased talking,      increased energy, grandiose thoughts, as well as excessive      religiosity.  She denied hallucinations, suicidal ideation, and      homicidal ideation, but there was a concern for an acute psychotic      episode  versus manic episode based on the duration of her symptoms.      A psychiatry consult was required the next day where the diagnosis  of mood disorder NOS, psychotic disorder NOS was made.  On the day      of the psychiatric consult, the patient had an episode where she      was wandering the halls and became confrontational with some of the      nursing staff.  The patient was able to be talked down and brought      back to her room, at which point she was given 2 mg of Haldol,      which appeared to calm her down.  She then began receiving per      psychiatry recommendation 2 mg of Haldol p.o. b.i.d.  This appeared      to keep the patient calm for the next 2 days, at which point her      sense was reassessed with her and her family.  It was determined      that the patient was back to her baseline level of functioning.      She appeared calm, was able to have a good night sleep of several      hours, and had begun to develop some insight into her behavior      earlier on admission.  No MRI was obtained and the patient's      neurological exam remained nonfocal.  She will be following up with      the outpatient Internal Medicine Clinic to see if we can taper off      the Haldol.  Per psychiatry's note, it was believed that the      patient's symptoms were secondary to her prednisone taper, and she      may have actually begun, although not noted by her family, to      demonstrate symptoms earlier in her prednisone course.  The      patient's EKG was checked daily after Haldol was initiated.  It did      not demonstrate any QT prolongations or PVCs to put her at risk.      However, when she returns for followup, we will recheck her EKG.  2. Asthma.  The patient has late-onset asthma and was recently treated      with a hospitalization.  The patient's asthma has been worked up      significantly on the previous admission.  She was placed on a      predinsone taper, starting February 08, 2008, which is believed to have      contributions to her problem #1, which is her altered mental      status, psychotic/manic episode.  The patient was also on a Flovent      inhaler, which was discontinued when she presented to the hospital.      She did not endorse any shortness of breath and maintained O2 sats      greater than 95% throughout her hospitalization.  She continued to      use her albuterol inhaler p.r.n., although she only used it once a      day at the most.  We will discharge the patient on just her      albuterol inhaler.  Per psychiatrist and again in terms of the      patient's asthma management, the literature has supported that the      future risk of mental disorder secondary to predinsone taper cannot      be protected based up on previous associated episodes.  For that  reason, we just determine to keep the patient on her albuterol      inhaler p.r.n., and we will allow the patient to start back the      Flovent on an outpatient basis, if the patient's mood symptoms have      subsequently resolved.  3. Hypokalemia.  The patient has had problems with hypokalemia in the      past and presented with a potassium of 3.0.  She subsequently      required repletion of calcium with occasional 40 mEq of potassium      chloride.  She was discharged on the same regimen of 40 mEq of      potassium daily.  It is believed that her hypokalemia is caused by      her hydrochlorothiazide.  This will require monitoring in the      outpatient setting.  She also had her magnesium levels checked as      an inpatient.  The magnesium level was within the normal range.      However, we decided to supplement her with lower magnesium, Slow-      Mag, which she will take a course of seven days.  She will have a      repeat basic metabolic pattern on followup to check on her      hypokalemia.  4. Hypertension.  The patient has a history of essential hypertension.      She is on the  regimen of amlodipine and hydrochlorothiazide as an      outpatient.  Her amlodipine was stopped on her inpatient admission.      She had pressures that were stable ranging from 128-155 with the      diastolic ranging from 70-95.  On discharge, the patient was      restarted on her home doses of amlodipine and hydrochlorothiazide.      The patient's discharge vitals; temperature 98.1, blood pressure      122/70, pulse 94, respirations 20, O2 saturation of 94% on room      air.  White blood cell count 5.4, hemoglobin 11.2, hematocrit 32.9,      platelets 182, sodium 137, potassium 3.0, chloride 100, bicarb of      30, BUN 9, creatinine 0.81, glucose 84, and calcium of 8.8.  Of      note, the patient was given 40 mEq of potassium prior to discharge      and discharged on potassium and magnesium oral supplements.     ______________________________  Dyke Brackett Phifer, M.D.  Electronically Signed    BC/MEDQ  D:  02/25/2008  T:  02/26/2008  Job:  [id number]   cc:   Rufina Falco, M.D.  Valetta Close, M.D.  [name], M.D.

## 2010-12-22 NOTE — Discharge Summary (Signed)
[place]  Patient:    Erika [name]                       MRN: [id number] Adm. Date:  [id number] Disc. Date: 08/09/99 Attending:  Jonne Ply                           Discharge Summary  ADMITTING DIAGNOSIS:  Lumbar epidural mass with left lumbar radiculopathy.  DISCHARGE DIAGNOSES: 1. Lumbar paraspinal epidural tuberculosis. 2. Normocytic anemia. 3. Lymphopenia. 4. Hiatal hernia, peptic ulcer disease by history. 5. Mild intermittent asthma. 6. Hypertension. 7. Inflammatory lesions of fingertips. 8. Positive purified protein derivative in 1995.  CONSULTATIONS: 1. Infectious disease, Dr. Cliffton Asters. 2. Dr. Cato Mulligan, [facility].  HOSPITAL COURSE:  The patient is a 70 year old individual who has had a several-month history of increasing back pain and left lower extremity weakness. She was found to have significant epidural mass in the lumbar spine.  A needle aspiration revealed nonpathologic diagnosis.  The patient was admitted to undergo surgical decompression.  The mass was felt to be consistent with a cancerous lesions, as there was bony invasion.  However, diagnosis revealed the presence f inflammatory tissue with some evidence of granuloma formation.  Acid-fast stains were negative.  However, because the patient had a positive PPD in 1995, tuberculosis is strongly suspected.  Cultures are still pending the final outcome. However, the patient has been started on antituberculous therapy with INH, ethambutol, rifampin, in addition to receiving pyridoxine.  Her pain is significantly improved, though she is still requiring Percocet for analgesia. he patient has been ambulatory.  Her left lower extremity strength reveals 4/5 strength in the tibialis anterior group.  Her incision is clean and dry.  At the current time, she is given a discharge prescription for Percocet for pain #60 without refills, Valium 5 mg #40  without refills.  She will be seen in the office in three weeks time for further follow-up.  Medical follow-up will be with Dr. Cato Mulligan.  According to infectious disease consultation with Dr. Orvan Falconer, Dr. Maurice March, and Dr. Burnice Logan, the health department has been notified regarding this diagnosis. DD:  08/09/99 TD:  08/09/99 Job: [id number]

## 2010-12-22 NOTE — H&P (Signed)
[place]  Patient:    Erika [name]                       MRN: [id number] Adm. Date:  [id number] Attending:  Jonne Ply                         History and Physical  ADMITTING DIAGNOSIS:  Lumbar epidural mass with severe left lumbar radiculopathy.  HISTORY OF PRESENT ILLNESS:  Patient is a 70 year old right-handed individual who has had pain in the left hip and left lower extremity since July of this past year. She notes that the pain started rather insidiously but became progressively more severe over the past months period of time.  She was recently seen by Dr. Fritzi Mandes and an MRI of the lumbar spine was performed.  The study revealed the presence of an epidural mass from the region of L4 down to S1.  It was causing significant compression and displacement of the thecal sac, putting pressure on the L5 nerve root and the S1 nerve root on the left side.  Patient denies any bowel or bladder control problems.  She does not feel that the leg has been weakened but notes that bearing any weight on the left lower extremity has  caused excruciating pain and she finds that flexing the right leg tends to aggravate the pain on the left side significantly.  She has not been able to get to sleep well or get comfortable in any given position.  She notes that sneezing and coughing make the pain acutely worse.  Bowel and bladder control have not been affected.  A few days ago, the patient underwent a needle biopsy in the L4-5 region by Dr. Arlice Colt; apparently that tissue shows some clear synovial fluid but no organisms were seen and there was minimal cellular debris.  There was concern that this may represent either infection or a tumor mass; she is now admitted to undergo surgical decompression.  PAST MEDICAL HISTORY:  The patients general health has been good.  She does have a history of hypertension but denies any  other significant medical problems.  CURRENT MEDICATIONS: 1. Hydrochlorothiazide 25 mg a day. 2. Cardura 8 mg per day. 3. Prempro 2.5 mg per day. 4. Cyclobenzaprine for back pain. 5. K-Dur. 6. Hydrocodone. 7. Celebrex.  ALLERGIES:  She denies any allergies to any medications.  HABITS:  Patient does not smoke.  She does not drink alcohol.  Her height and weight have been stable at 265 pounds and 5 feet 7 inches in height.  FAMILY HISTORY:  Negative for any significant medical problems within the family.  REVIEW OF SYSTEMS:  Notable for pain while walking, high blood pressure, joint ain or swelling and the complete review sheet was discussed with patient.  SOCIAL HISTORY:  The patient is married.  She works at [employer] at [employer].  PHYSICAL EXAMINATION:  GENERAL:  She is an alert, oriented, cooperative individual in no overt distress.  NEUROLOGIC:  She will stand straight and erect with some difficulty.  She flexes forward a maximum of 45 degrees.  She does not extent.  Motor strength in the lower extremities reveals that iliopsoas, quads, tibialis anterior and gastrocnemii have good strength, particularly on the left side, but also on the right side.  Tone and bulk appear normal.  Deep tendon reflexes are decreased in the Achilles on  the eft side; 1+ in the patellae bilaterally.  Sensation is diminished on the left aspect of the leg.  Straight leg raising is negative on the left to 80 degrees but positive on the right side for left leg pain at 45 degrees.  Patricks maneuver s negative.  Blood pressure is noted to be 140/92.  Her heart rate is 110 and regular.  Respirations are 14 and unlabored.  Cranial nerve examination revealed the pupils are 3 mm and briskly reactive to light and accommodation.  The extraocular movements are full.  Face is symmetric to grimace.  Tongue and uvula are in the midline.  Sclerae and conjunctivae are clear.  NECK:   No masses and no bruits are heard.  HEART:  Regular rate and rhythm.  No murmurs are heard.  ABDOMEN:  Soft.  Bowel sounds are positive and no masses are palpable.  EXTREMITIES:  No cyanosis, clubbing or edema.  IMPRESSION:  The patient has evidence of an epidural mass from L4 down to the sacrum.  She needs to be decompressed, both for diagnostic purposes and also for the purpose of preserving her neurologic status.  The mass is quite extensive and I discussed the nature of the surgery with her at some length in the office on the 20th.  I indicated the major risks of bleeding, significant blood loss, infection, possible need for further surgery and possible need for additional treatment. he patient understands these things and wishes to proceed and she is admitted today for this purpose. DD:  08/01/99 TD:  08/01/99 Job: [id number]

## 2010-12-22 NOTE — Op Note (Signed)
[place]  Patient:    Erika [name]                       MRN: [id number] Proc. Date: 08/01/99 Adm. Date:  [id number] Attending:  Jonne Ply                           Operative Report  PREOPERATIVE DIAGNOSIS:  Epidural mass, L4 to sacrum with left lumbar radiculopathy.  POSTOPERATIVE DIAGNOSIS:  Epidural mass, L4 to sacrum with left lumbar radiculopathy.  PROCEDURE:  Hemilaminectomy of L4, L5 and S1 decompression of tumor in epidural  space.  SURGEON:  Stefani Dama, M.D.  FIRST ASSISTANT:  Alanson Aly. Roxan Hockey, M.D.  ANESTHESIA:  General endotracheal.  FROZEN SECTION DIAGNOSIS:  Granulomatous tissue with areas of necrosis.  ESTIMATED BLOOD LOSS:  300 cc.  INDICATIONS:  The patient is a 70 year old individual who has had progressive left lower extremity pain and weakness.  She has a fairly normal neurologic exam with extensive epidural mass from L4 to the sacrum.  She is taken to the operating room to undergo decompression, both for diagnosis and for preservation of the neurologic integrity.  DESCRIPTION OF PROCEDURE:  The patient was brought to the operating room supine on the stretcher.  After smooth induction of general endotracheal anesthesia, she as turned prone.  The back was shaved prepped with DuraPrep and draped in a sterile fashion.  A midline incision was created and carried down to the lumbodorsal fascia which was opened on the left side of the midline.  The spinous process of L4 was identified positively with the radiograph.  Dissection was then carried down to the sacrum.  Self-retaining McCullough retractors were placed in the wound.  In the  epidural space, there was noted to be some abnormal mass of tissue, sort of gelatinous in nature.  Biopsies were sent from this.  It appeared to be involving the lamina and facet joints at the L4-5 level.  Hemilaminectomy of L4-5 was then created.   Specimens were sent for permanent section.  As the epidural fat was dissected, there was some material that appeared to be consistent with necrotic tissue or pus.  However, it had a very fluid-like consistency with gross particulant matter in it.  This was sent for a separate biopsy and from this area, cultures were obtained.  These were sent for both routine cultures, and aerobic and anaerobic form, plus fungal study and cultures and _________ bacterial cultures also.  The initial frozen section biopsy results revealed granulomatous tissue.  Further samples of the gelatinous tissue itself  were sent, and this was felt to be highly suggestive of malignancy.  There was noted to be bony invasion in the L5 and L4 lamina.  Specimens were sent grossly for permanent microscopic inspection also, and gross specimens were sent for microbiology and bacteriology studies also.  The procedure was continued by performing a complete hemilaminectomy of L4, L5 nd the sacrum.  This tumor which was fibrous at times and adherent the dura was dissected from it and removed.  Hemostasis from the regions were obtained with oth the bipolar cautery and some judicious use of Gelfoam soaked in thrombin, the pledgets of which were later removed.  The dorsal aspect of the disk space was reached with the encompassing epidural veins.  There were noted to be some more  hemorrhage and dissection went slower.  Eventually though,  the S1 nerve root, the L5 nerve root, and the L4 nerve root were all uncovered and then travel out the  exit foramen.  These were decompressed of the tumorous mass that surrounded them, and hemostasis from each of these areas was achieved.  In the end, a good decompression of the common dural tube and the L4, L5 and S1  nerve roots was obtained via the hemilaminectomy procedure.  With adequate hemostasis, the area was copiously irrigated with antibiotic irrigating solution, and the  lumbodorsal fascia was closed with #1 Vicryl in an interrupted fashion,  2-0 Vicryl was used subcutaneously, and subcuticularly the skin was closed with  3-0 Vicryl.  The patient tolerated the procedure well.  Blood loss was estimated at 300 cc. DD:  08/01/99 TD:  08/02/99 Job: [id number]

## 2010-12-22 NOTE — Op Note (Signed)
   NAME:  Erika [name], Erika [name]                         ACCOUNT NO.:  [id number]   MEDICAL RECORD NO.:  [id number]                   PATIENT TYPE:  AMB   LOCATION:  ENDO                                 FACILITY:  [facility]   PHYSICIAN:  Graylin Shiver, M.D.                DATE OF BIRTH:  27-Apr-1941   DATE OF PROCEDURE:  04/15/2003  DATE OF DISCHARGE:                                 OPERATIVE REPORT   PROCEDURE:  Colonoscopy with biopsy.   INDICATION FOR PROCEDURE:  Rectal bleeding.   Informed consent was obtained after explanation of the risks of bleeding,  infection, and perforation.   PREMEDICATION:  Fentanyl 75 mcg IV, Versed 7.5 mg IV.   DESCRIPTION OF PROCEDURE:  With the patient in the left lateral decubitus  position, a rectal exam was performed and no masses were felt.  The Olympus  colonoscope was inserted into the rectum and advanced around the colon to  the cecum.  Cecal landmarks were identified.  The cecum and ascending colon  were normal.  The transverse colon was normal.  In the mid-descending colon  there was a small 3 mm sessile polyp removed with cold forceps.  The sigmoid  looked normal.  The rectum looked normal.  The scope was retroflexed in the  rectum.  There were some small internal hemorrhoids.  The scope was  straightened and brought out.  She tolerated the procedure well without  complications.   IMPRESSION:  1. Small 3 mm polyp in the descending colon.  2. Internal hemorrhoids.   PLAN:  The pathology will be checked.                                               Graylin Shiver, M.D.    [name]  D:  04/15/2003  T:  04/15/2003  Job:  [id number]   cc:   Marisue Brooklyn, M.D.  [facility]  [place], [place] [address]  Fax: [phone]

## 2011-03-13 ENCOUNTER — Ambulatory Visit (HOSPITAL_COMMUNITY)
Admission: RE | Admit: 2011-03-13 | Discharge: 2011-03-13 | Disposition: A | Discharge status: Home / Self Care | Payer: Medicare Other | Source: Ambulatory Visit | Attending: Internal Medicine | Admitting: Internal Medicine

## 2011-03-13 DIAGNOSIS — Z1231 Encounter for screening mammogram for malignant neoplasm of breast: Secondary | ICD-10-CM | POA: Insufficient documentation

## 2011-04-05 ENCOUNTER — Encounter: Payer: Medicare Other | Admitting: Physician Assistant

## 2011-04-25 ENCOUNTER — Ambulatory Visit (INDEPENDENT_AMBULATORY_CARE_PROVIDER_SITE_OTHER): Payer: Medicare Other | Admitting: Internal Medicine

## 2011-04-25 ENCOUNTER — Ambulatory Visit (HOSPITAL_COMMUNITY)
Admission: RE | Admit: 2011-04-25 | Discharge: 2011-04-25 | Disposition: A | Discharge status: Home / Self Care | Payer: Medicare Other | Source: Ambulatory Visit | Attending: Internal Medicine | Admitting: Internal Medicine

## 2011-04-25 ENCOUNTER — Encounter: Payer: Self-pay | Admitting: Internal Medicine

## 2011-04-25 ENCOUNTER — Other Ambulatory Visit: Payer: Self-pay | Admitting: Internal Medicine

## 2011-04-25 DIAGNOSIS — I10 Essential (primary) hypertension: Secondary | ICD-10-CM

## 2011-04-25 DIAGNOSIS — I499 Cardiac arrhythmia, unspecified: Secondary | ICD-10-CM

## 2011-04-25 DIAGNOSIS — R9431 Abnormal electrocardiogram [ECG] [EKG]: Secondary | ICD-10-CM | POA: Insufficient documentation

## 2011-04-25 DIAGNOSIS — J45909 Unspecified asthma, uncomplicated: Secondary | ICD-10-CM

## 2011-04-25 DIAGNOSIS — Z23 Encounter for immunization: Secondary | ICD-10-CM

## 2011-04-25 DIAGNOSIS — L293 Anogenital pruritus, unspecified: Secondary | ICD-10-CM

## 2011-04-25 DIAGNOSIS — Z0000 Encounter for general adult medical examination without abnormal findings: Secondary | ICD-10-CM

## 2011-04-25 DIAGNOSIS — B029 Zoster without complications: Secondary | ICD-10-CM

## 2011-04-25 DIAGNOSIS — R000 Tachycardia, unspecified: Secondary | ICD-10-CM

## 2011-04-25 DIAGNOSIS — N898 Other specified noninflammatory disorders of vagina: Secondary | ICD-10-CM

## 2011-04-25 DIAGNOSIS — E876 Hypokalemia: Secondary | ICD-10-CM

## 2011-04-25 LAB — CBC
MCHC: 32.3 g/dL (ref 30.0–36.0)
MCV: 92.6 fL (ref 78.0–100.0)
Platelets: 232 10*3/uL (ref 150–400)
RBC: 4.32 MIL/uL (ref 3.87–5.11)
RDW: 15.1 % (ref 11.5–15.5)
WBC: 4.8 10*3/uL (ref 4.0–10.5)

## 2011-04-25 LAB — BASIC METABOLIC PANEL
BUN: 23 mg/dL (ref 6–23)
Calcium: 9.4 mg/dL (ref 8.4–10.5)
Chloride: 102 mEq/L (ref 96–112)
Creat: 1.11 mg/dL — ABNORMAL HIGH (ref 0.50–1.10)
Glucose, Bld: 84 mg/dL (ref 70–99)
Potassium: 4.3 mEq/L (ref 3.5–5.3)

## 2011-04-25 LAB — MAGNESIUM: Magnesium: 1.9 mg/dL (ref 1.5–2.5)

## 2011-04-25 LAB — TSH: TSH: 1.446 u[IU]/mL (ref 0.350–4.500)

## 2011-04-25 MED ORDER — FLUCONAZOLE 150 MG PO TABS: 150.0000 mg | ORAL_TABLET | Freq: Once | ORAL | Status: DC

## 2011-04-25 MED ORDER — FLUTICASONE-SALMETEROL 250-50 MCG/DOSE IN AEPB: 1.0000 | INHALATION_SPRAY | Freq: Two times a day (BID) | RESPIRATORY_TRACT | Status: DC

## 2011-04-25 NOTE — Assessment & Plan Note (Addendum)
Pt tolerating her current regimen well and her blood pressure is well controlled.  Will check CMET today to asses electrolyte status.  Advised her to check her blood pressure at home if she is able by using a friend's cuff.  She takes her Maxzide on most days however states she does not take the medication when she feels that she's not having significant problems with lower extremity edema.  Advised her that ideally the medication should be taken every day for blood pressure control. We'll have her record home blood pressure meds and measurements to determine if it is possible to discontinue one of her antihypertensive agents.  Follow this up at her next office visit in December

## 2011-04-25 NOTE — Assessment & Plan Note (Addendum)
Discussed benefits of TdaP vacciation.

## 2011-04-25 NOTE — Assessment & Plan Note (Signed)
Patient reports intermittent tenderness at the side elevation of outbreak years ago. She denies any blister formation or symptoms persisting for longer than a few hours.  Informed her that shingles may indeed recur. Advised her to call the clinic  as soon as she she develops pain, blisters, or rash she'll begin antiviral therapy. Informed her that treatment is ideally started within the first 72 hours after initial symptoms. Will provide her with an educational handout about shingles today.

## 2011-04-25 NOTE — Assessment & Plan Note (Signed)
Patient declines external examination of genitalia as well as pelvic exam. Her symptoms of itching seem most consistent with a vaginal yeast infection. Will treat with oral fluconazole. Patient is advised to return to the clinic for examination and further workup if her symptoms do not resolve with empiric treatment for Candida.  It is certainly  possible that she is having chemical irritation from chlorine.

## 2011-04-25 NOTE — Assessment & Plan Note (Signed)
Pt tachycardic with slightly irregular rhythm on exam.  She states she has been told she has had an irregular rhythm previously; has never been dx'd with a.fib/flutter.   Will obtain EKG today and check TSH as well as routine labs.

## 2011-04-25 NOTE — Patient Instructions (Addendum)
Please schedule a follow up office visit with Dr. Arvilla Market in Dec 2012, or sooner if needed. Fluconazole (diflucan) is a medicine to help with your itching.  Please use as directed.  If your symptoms do not improve after taking the second pill, please call the office at [phone] to be re-evaluated. Keep taking your medicine as directed. Have a friend with a blood pressure cuff check your blood pressure a few times between now and your next appointment.  Write these numbers down and bring them with you to your next visit with me.  The top number should be between 120-140. I will call you if any of your lab work is abnormal. Please call the office at [phone] with any concerns or questions. If you feel like you're having an outbreak of shingles, call the office as soon as possible to start treatment.  Ask to leave a message or speak with Dr. Arvilla Market  Hypertension (High Blood Pressure) Most people with high blood pressure (hypertension) have no symptoms. High blood pressure can be a dangerous problem. Hypertension is dangerous because you may have it and not know it. High blood pressure may mean that your heart needs to work harder to pump blood.  Your blood pressure is measured with 2 numbers. The first number is when your heart flexes (contracts), and the second number is when your heart relaxes. The higher the numbers are, the more you are at risk for problems. Write down your blood pressure today. The best blood pressure for adults is 120/80 (mmHg) or lower. It is likely that your blood pressure was recorded at least 2 times today. It is important for you to give these numbers to your doctor. If you do not have a doctor, try to get follow-up care at a hospital or community clinic. You may need to start high blood pressure medicine. You may also need to adjust your medicines as told by your doctor. Even mild high blood pressure increases long-term health risks. One high reading does not mean you have  hypertension.  Your blood pressure should be taken when you are relaxed. It is also important to sit for about 10 minutes before being tested.   Things that can increase your blood pressure are:   Injury.   Illness.    Stress.     Caffeine.   Some medicines (like decongestants).       High blood pressure does not usually need emergency treatment.  HOME CARE  Lifestyle and medicine changes may be needed, including:   Weight loss.   Exercise.   Limit the use of salt.   Stop smoking.   If using decongestants or birth control pills, talk to your doctor. These medicines might make blood pressure higher.   Do not use street drugs.   Females should not drink more than 1 alcoholic drink per day. Males should not drink more than 2 alcoholic drinks per day.   Take your blood pressure medicine. You will need to take it every day. If you do not get treated, there are risks, including:   Heart disease.   Stroke.   Kidney failure.   See your doctor as told.  GET HELP RIGHT AWAY IF:  You get a very bad headache.   You get blurred or changing vision.   You feel confused.   You feel weak, numb, or faint.   You get chest or belly (abdominal) pain.   You throw up (vomit).   You cannot breathe very well.  If you have a blood pressure reading with a top number of 180 or higher, you need to see your doctor right away. This is especially true if you are having any of the problems listed above. MAKE SURE YOU:  Understand these instructions.   Will watch your condition.   Will get help right away if you are not doing well or get worse.  Document Released: 01/09/2008 Document Re-Released: 01/10/2010 [facility] Patient Information 2011 [place], [place].Shingles (Herpes Zoster) Shingles is caused by the same virus that causes chicken pox. The first feelings may be pain or tingling. A rash will follow in a couple days. The rash may occur on any area of the body. Long-lasting  pain is more likely in an elderly person. It can last months to years. There are medicines that can help prevent pain if you start taking them early. HOME CARE  Place cool cloths on the rash.   Only take medicine as told by your doctor.   Calamine lotion can be used to relieve itchy skin.   Avoid touching:   Babies.   Children with inflamed skin (eczema).   People who have gotten transplanted organs.   People with chronic illnesses, such as leukemia and AIDS.   If the rash is on the face, you may be told to see a specialist. It is very important to keep all appointments. Shingles must be kept away from the eyes, if possible.  GET HELP RIGHT AWAY IF:  There is any pain on the face or eye. This must be followed carefully by your doctor or eye doctor. An infection of part of the eye (cornea) can be very serious. It could lead to blindness.   The medicines do not help.   The redness or puffiness (swelling) spreads.   You or your child has a temperature by mouth above 100.8, not controlled by medicine.   Your baby is older than 3 months with a rectal temperature of 102 F (38.9 C) or higher.   Your baby is 30 months old or younger with a rectal temperature of 100.4 F (38 C) or higher.   You notice any red lines going away from the rash area.  MAKE SURE YOU:  Understand these instructions.   Will watch this condition.   Will get help right away if you or your child is not doing well or gets worse.  Document Released: 01/09/2008 Document Re-Released: 10/17/2009 [facility] Patient Information 2011 [place], [place].

## 2011-04-25 NOTE — Assessment & Plan Note (Signed)
Pt tachycardic with slightly irregular rhythm on exam.  She states she has been told she has had an irregular rhythm previously; has never been dx'd with a.fib/flutter.  12 lead EKG obtained in office reveals what appears to be first degree AV block with intermittent A. Fib/flutter; this is a change from her prior EKG which revealed only first degree AV block with slight QT prolongation.  She is currently asymptomatic and hemodynamically stable.  Will check routine labs including CMET, Mag level, and TSH today.  We'll also refer her to cardiology.   Appt scheduled for 9/26.12 at  11:45 am with Dr. Clifton James.  Provided patient with copies of EKG is obtained today and asked her to please bring these with her to her appointment with cardiology.

## 2011-04-25 NOTE — Assessment & Plan Note (Signed)
Will check electrolytes today

## 2011-04-25 NOTE — Assessment & Plan Note (Signed)
Patient's asthma is currently very well controlled. She has not used her rescue inhaler for over a year. When discussing use of Advair she states that she only uses this medication if she does she will be taking a long walk. Advised her that she will derive a greater benefit by taking the medication daily. She agrees to start doing this. Will submit refills for Advair today.  >45 minutes with at least 50% of time spent face-to-face with the patient discussing her chronic and acute medical issues, counseling her about continued management, and coordinating care with other healthcare providers.

## 2011-04-25 NOTE — Progress Notes (Signed)
  Subjective:    Patient ID: Erika [name], female    DOB: 1941/03/02, 70 y.o.   MRN: [id number]  HPI  Erika [name] is a 70 y/o F presenting today for routine f/u.  Pt reports vaginal itching for ~ 53month.  She notes that the sx began after starting a swimming program.  She denies any pain, "raw sensation, dysuria, fevers/chills, vaginal bleeding or discharge.  She denies any fever, chills, or other complaint.  Please see the A&P for the status of the pt's medical problems.   Review of Systems  Constitutional: Negative for fever, chills, diaphoresis, activity change, appetite change, fatigue and unexpected weight change.  HENT: Negative for hearing loss, congestion and neck stiffness.   Eyes: Negative for photophobia, pain and visual disturbance.  Respiratory: Negative for cough, chest tightness, shortness of breath and wheezing.   Cardiovascular: Negative for chest pain and palpitations.  Gastrointestinal: Negative for abdominal pain, blood in stool and anal bleeding.  Genitourinary: Negative for dysuria, hematuria and difficulty urinating.  Musculoskeletal: Negative for joint swelling.  Neurological: Negative for dizziness, syncope, speech difficulty, weakness, numbness and headaches.       Objective:   Physical Exam VItal signs reviewed.  Tachycardia noted GEN: No apparent distress.  Alert and oriented x 3.  Pleasant, conversant, and cooperative to exam. HEENT: head is autraumatic and normocephalic.  Neck is supple without palpable masses or lymphadenopathy.  No JVD or carotid bruits.  Vision intact.  EOMI.  PERRLA.  Sclerae anicteric.  Conjunctivae without pallor or injection. Mucous membranes are moist.  Oropharynx is without erythema, exudates, or other abnormal lesions.  Dentition is good. RESP:  Lungs are clear to ascultation bilaterally with good air movement.  No wheezes, ronchi, or rubs. CARDIOVASCULAR:tachycardic, slightly irregular rhythm.  Clear S1, S2, no murmurs, gallops,  or rubs. ABDOMEN: soft, non-tender, non-distended.  Bowels sounds present in all quadrants and normoactive.  No palpable masses. EXT: warm and dry.  Peripheral pulses equal, intact, and +2 globally.  No clubbing or cyanosis.  Trace edema in bilateral lower extremities. SKIN: warm and dry with normal turgor.  No rashes or abnormal lesions observed. NEURO: CN II-XII grossly intact.  Muscle strength +5/5 in bilateral upper and lower extremities.  Sensation is grossly intact.  No focal deficit.        Assessment & Plan:

## 2011-05-02 ENCOUNTER — Ambulatory Visit: Payer: Medicare Other | Admitting: Cardiovascular Disease

## 2011-05-03 LAB — RAPID URINE DRUG SCREEN, HOSP PERFORMED
Amphetamines: NOT DETECTED
Barbiturates: NOT DETECTED
Benzodiazepines: NOT DETECTED
Opiates: NOT DETECTED

## 2011-05-03 LAB — CBC
HCT: 38.4
HCT: 36.8
HCT: 36.2
Hemoglobin: 12.5
MCHC: 33.1
MCHC: 33.6
MCHC: 33.8
MCV: 92.5
MCV: 92.1
MCV: 92.8
MCV: 92.8
Platelets: 202
Platelets: 159
RBC: 4.15
RBC: 3.9
RBC: 3.97
RDW: 15
WBC: 7
WBC: 6.9
WBC: 10.9 — ABNORMAL HIGH

## 2011-05-03 LAB — DIFFERENTIAL
Basophils Absolute: 0
Basophils Relative: 0
Basophils Relative: 0
Basophils Relative: 0
Eosinophils Absolute: 0
Eosinophils Absolute: 0
Eosinophils Relative: 31 — ABNORMAL HIGH
Eosinophils Relative: 0
Lymphs Abs: 1.1
Lymphs Abs: 0.8
Monocytes Absolute: 0.4
Monocytes Absolute: 0 — ABNORMAL LOW
Monocytes Absolute: 0.3
Monocytes Absolute: 0.4
Monocytes Relative: 6
Monocytes Relative: 3
Monocytes Relative: 4
Neutro Abs: 5.6
Neutro Abs: 8.4 — ABNORMAL HIGH
Neutrophils Relative %: 91 — ABNORMAL HIGH

## 2011-05-03 LAB — MPO/PR-3 (ANCA) ANTIBODIES

## 2011-05-03 LAB — BASIC METABOLIC PANEL WITH GFR
BUN: 18
CO2: 31
Calcium: 9.3
Chloride: 97
Creatinine, Ser: 0.91
GFR calc non Af Amer: >60
Glucose, Bld: 112 — ABNORMAL HIGH
Potassium: 4.4
Sodium: 135

## 2011-05-03 LAB — POCT I-STAT, CHEM 8
BUN: 13
Calcium, Ion: 1.13
Chloride: 97
Glucose, Bld: 95
Potassium: 3.3 — ABNORMAL LOW

## 2011-05-03 LAB — BASIC METABOLIC PANEL
BUN: 14
CO2: 27
Calcium: 9.4
Chloride: 99
Creatinine, Ser: 0.86
GFR calc Af Amer: >60
GFR calc non Af Amer: 60 — ABNORMAL LOW
Glucose, Bld: 136 — ABNORMAL HIGH
Glucose, Bld: 141 — ABNORMAL HIGH
Potassium: 3.9
Potassium: 4.3
Sodium: 134 — ABNORMAL LOW

## 2011-05-03 LAB — LIPID PANEL
Cholesterol: 153
LDL Cholesterol: 80
Triglycerides: 37

## 2011-05-03 LAB — COMPREHENSIVE METABOLIC PANEL
ALT: 20
AST: 30
Albumin: 3.6
Albumin: 3.2 — ABNORMAL LOW
Alkaline Phosphatase: 98
BUN: 10
BUN: 14
CO2: 31
Calcium: 9
Chloride: 97
Chloride: 97
Creatinine, Ser: 1.09
GFR calc Af Amer: >60
GFR calc non Af Amer: 50 — ABNORMAL LOW
Potassium: 3.3 — ABNORMAL LOW
Sodium: 134 — ABNORMAL LOW
Total Bilirubin: 0.8
Total Bilirubin: 1.1
Total Protein: 8.1

## 2011-05-03 LAB — URINALYSIS, ROUTINE W REFLEX MICROSCOPIC
Bilirubin Urine: NEGATIVE
Bilirubin Urine: NEGATIVE
Glucose, UA: NEGATIVE
Glucose, UA: NEGATIVE
Ketones, ur: NEGATIVE
Ketones, ur: NEGATIVE
Protein, ur: NEGATIVE
Urobilinogen, UA: 0.2
pH: 6

## 2011-05-03 LAB — URINE MICROSCOPIC-ADD ON

## 2011-05-03 LAB — CARDIAC PANEL(CRET KIN+CKTOT+MB+TROPI)
CK, MB: 5.8 — ABNORMAL HIGH
CK, MB: 8.7 — ABNORMAL HIGH
Relative Index: 2.8 — ABNORMAL HIGH
Total CK: 262 — ABNORMAL HIGH
Troponin I: 0.02
Troponin I: <0.01

## 2011-05-03 LAB — URINE CULTURE: Colony Count: >=100000

## 2011-05-03 LAB — ANA: Anti Nuclear Antibody(ANA): NEGATIVE

## 2011-05-04 LAB — BASIC METABOLIC PANEL
BUN: 12
BUN: 9
CO2: 27
CO2: 30
CO2: 30
Calcium: 8.6
Calcium: 8.8
Calcium: 8.8
Chloride: 103
GFR calc non Af Amer: 60 — ABNORMAL LOW
GFR calc non Af Amer: >60
Glucose, Bld: 113 — ABNORMAL HIGH
Glucose, Bld: 115 — ABNORMAL HIGH
Glucose, Bld: 84
Potassium: 3 — ABNORMAL LOW
Sodium: 137
Sodium: 138

## 2011-05-04 LAB — DIFFERENTIAL
Basophils Absolute: 0
Basophils Absolute: 0
Basophils Relative: 0
Basophils Relative: 1
Eosinophils Absolute: 0.1
Eosinophils Relative: 2
Eosinophils Relative: 5
Lymphocytes Relative: 28
Monocytes Absolute: 0.5
Monocytes Absolute: 0.6
Neutro Abs: 4.4

## 2011-05-04 LAB — CBC
HCT: 32.9 — ABNORMAL LOW
Hemoglobin: 11 — ABNORMAL LOW
Hemoglobin: 11.2 — ABNORMAL LOW
MCHC: 33.6
MCHC: 34
MCV: 92.3
Platelets: 182
RDW: 14.8
RDW: 14.8

## 2011-05-04 LAB — URINALYSIS, ROUTINE W REFLEX MICROSCOPIC
Bilirubin Urine: NEGATIVE
Hgb urine dipstick: NEGATIVE
Ketones, ur: NEGATIVE
Nitrite: NEGATIVE
Urobilinogen, UA: 1

## 2011-05-04 LAB — CULTURE, BLOOD (ROUTINE X 2)
Culture: NO GROWTH
Culture: NO GROWTH

## 2011-05-16 ENCOUNTER — Ambulatory Visit (INDEPENDENT_AMBULATORY_CARE_PROVIDER_SITE_OTHER): Payer: Medicare Other | Admitting: Cardiovascular Disease

## 2011-05-16 ENCOUNTER — Encounter: Payer: Self-pay | Admitting: Cardiovascular Disease

## 2011-05-16 VITALS — BP 150/90 | HR 96 | Ht 67.0 in | Wt 331.0 lb

## 2011-05-16 DIAGNOSIS — I491 Atrial premature depolarization: Secondary | ICD-10-CM | POA: Insufficient documentation

## 2011-05-16 MED ORDER — ASPIRIN EC 81 MG PO TBEC: 81.0000 mg | DELAYED_RELEASE_TABLET | Freq: Every day | ORAL | Status: AC

## 2011-05-16 NOTE — Progress Notes (Signed)
History of Present Illness:70 yo female with history of HTN, asthma, remote SVT, DJD, GERD  who is referred today for evaluation of tachycardia with irregularity on exam and EKG in primary care clinic last month. Her EKG from 04/25/11 is reviewed and shows sinus rhythm with PACs and 1st degree AV block. She has no history of heart disease. She has no family history of heart disease. She denies any chest pain, SOB, palpitations, near syncope or syncope. She feels well.    Past Medical History  Diagnosis Date  . Hypertension   . Tuberculosis     active TB treated in 2002, hx of paraspinal lumbar TB,   . Obesity     BMI 54  . Menopause   . Breast cyst     Excesion with FNA, begnin in 2004.   [name] Shingles   . Asthma   . SVT (supraventricular tachycardia) June 2009    one run while hospitalized  . Back pain     status post surgery 2002  . Stasis dermatitis     W/ LE edema, prviously on lasix now on mazxide.   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  . Lymphadenopathy     Of the mediastinum, Right side CXR 2008, not read on 2010 cxr.   [name] History of shingles     Recurrent with post herpetic neuralgia.   . Degenerative joint disease of spine     Imaging 2005,  Degenerative hypertrophic facet arthritis changes L4-5 and L5-S1.[name]     Past Surgical History  Procedure Date  . Hemilaminectomy of l4, l5 and s1 decompression of tumor in epidural space 2000  . Breast cyst biopsy     Current Outpatient Prescriptions  Medication Sig Dispense Refill  . albuterol (PROVENTIL HFA;VENTOLIN HFA) 108 (90 BASE) MCG/ACT inhaler Inhale 1-2 puffs into the lungs every 4 (four) hours as needed. For shortness of breath, cough, or wheezing       . amLODipine (NORVASC) 10 MG tablet Take 1 tablet (10 mg total) by mouth daily.  90 tablet  4  . Fluticasone-Salmeterol (ADVAIR DISKUS) 250-50 MCG/DOSE AEPB Inhale 1 puff into the lungs 2 (two) times daily.  60 each  3  . omeprazole (PRILOSEC) 20 MG capsule Take 1  capsule (20 mg total) by mouth daily.  30 capsule  3  . OVER THE COUNTER MEDICATION SINUS RELIEF  AS NEEDED       . triamterene-hydrochlorothiazide (MAXZIDE) 75-50 MG per tablet Take one half tablet a day  30 tablet  11    Allergies  Allergen Reactions  . Prednisone Other (See Comments)    REACTION: Psychosis, talking out of head, insomnia    History   Social History  . Marital Status: Widowed    Spouse Name: N/A    Number of Children: N/A  . Years of Education: N/A   Occupational History  . retired   . volunteers at local school    Social History Main Topics  . Smoking status: Never Smoker   . Smokeless tobacco: Not on file  . Alcohol Use: No  . Drug Use: Not on file  . Sexually Active: Not on file   Other Topics Concern  . Not on file   Social History Narrative   No regular exercise, retired, single, volunteers at a local school.     Family History  Problem Relation Age of Onset  . Stroke Mother     at young age  . Stroke  Father     in 65's  . Stroke Brother   . Stroke Brother     Review of Systems:  As stated in the HPI and otherwise negative.   BP 150/90  Pulse 96  Ht 5\' 7"  (1.702 m)  Wt 331 lb (150.141 kg)  BMI 51.84 kg/m2  Physical Examination: General: Well developed, well nourished, NAD HEENT: OP clear, mucus membranes moist SKIN: warm, dry. No rashes. Neuro: No focal deficits Musculoskeletal: Muscle strength 5/5 all ext Psychiatric: Mood and affect normal Neck: No JVD, no carotid bruits, no thyromegaly, no lymphadenopathy. Lungs:Clear bilaterally, no wheezes, rhonci, crackles Cardiovascular: Regular rate and rhythm. No murmurs, gallops or rubs. Abdomen:Soft. Bowel sounds present. Non-tender.  Extremities: No lower extremity edema. Pulses are 2 + in the bilateral DP/PT.  EKG:NSR rate 96 bpm. Normal EKG.

## 2011-05-16 NOTE — Assessment & Plan Note (Signed)
Review of her EKG from 04/25/11 demonstrates premature beats. I do not see any evidence of atrial fibrillation. I will have her start ASA 81 mg po QDaily. I will arrange an echo to exclude structural heart disease. She is advised to avoid any stimulants including caffeine, nicotine and over the counter meds with stimulant additives.

## 2011-05-16 NOTE — Patient Instructions (Signed)
Your physician recommends that you schedule a follow-up appointment in: 3 weeks.  Your physician has requested that you have an echocardiogram. Echocardiography is a painless test that uses sound waves to create images of your heart. It provides your doctor with information about the size and shape of your heart and how well your heart's chambers and valves are working. This procedure takes approximately one hour. There are no restrictions for this procedure.   Your physician has recommended you make the following change in your medication: Start aspirin 81 mg by mouth daily.

## 2011-05-17 ENCOUNTER — Ambulatory Visit (HOSPITAL_COMMUNITY): Payer: Medicare Other | Attending: Cardiovascular Disease | Admitting: Radiology

## 2011-05-17 DIAGNOSIS — R002 Palpitations: Secondary | ICD-10-CM | POA: Insufficient documentation

## 2011-05-17 DIAGNOSIS — E785 Hyperlipidemia, unspecified: Secondary | ICD-10-CM | POA: Insufficient documentation

## 2011-05-17 DIAGNOSIS — I491 Atrial premature depolarization: Secondary | ICD-10-CM

## 2011-05-17 DIAGNOSIS — I498 Other specified cardiac arrhythmias: Secondary | ICD-10-CM | POA: Insufficient documentation

## 2011-05-17 DIAGNOSIS — I10 Essential (primary) hypertension: Secondary | ICD-10-CM | POA: Insufficient documentation

## 2011-05-17 DIAGNOSIS — R9431 Abnormal electrocardiogram [ECG] [EKG]: Secondary | ICD-10-CM

## 2011-05-21 ENCOUNTER — Other Ambulatory Visit: Payer: Self-pay | Admitting: *Deleted

## 2011-05-22 ENCOUNTER — Telehealth: Payer: Self-pay | Admitting: Cardiovascular Disease

## 2011-05-22 MED ORDER — OMEPRAZOLE 20 MG PO CPDR: 20.0000 mg | DELAYED_RELEASE_CAPSULE | Freq: Every day | ORAL | Status: DC

## 2011-05-22 NOTE — Telephone Encounter (Signed)
Spoke with pt and gave her echo results

## 2011-05-22 NOTE — Telephone Encounter (Signed)
Patient calling someone call her home returning the call back.

## 2011-05-23 ENCOUNTER — Encounter: Payer: Self-pay | Admitting: *Deleted

## 2011-06-07 ENCOUNTER — Ambulatory Visit: Payer: Medicare Other | Admitting: Cardiovascular Disease

## 2011-10-05 ENCOUNTER — Encounter: Payer: Self-pay | Admitting: Internal Medicine

## 2011-10-05 ENCOUNTER — Ambulatory Visit (INDEPENDENT_AMBULATORY_CARE_PROVIDER_SITE_OTHER): Payer: Medicare Other | Admitting: Internal Medicine

## 2011-10-05 VITALS — BP 145/94 | HR 97 | Temp 97.1°F | Ht 64.0 in | Wt 336.7 lb

## 2011-10-05 DIAGNOSIS — N898 Other specified noninflammatory disorders of vagina: Secondary | ICD-10-CM

## 2011-10-05 DIAGNOSIS — I10 Essential (primary) hypertension: Secondary | ICD-10-CM

## 2011-10-05 DIAGNOSIS — L293 Anogenital pruritus, unspecified: Secondary | ICD-10-CM

## 2011-10-05 DIAGNOSIS — E785 Hyperlipidemia, unspecified: Secondary | ICD-10-CM

## 2011-10-05 DIAGNOSIS — M479 Spondylosis, unspecified: Secondary | ICD-10-CM

## 2011-10-05 LAB — COMPREHENSIVE METABOLIC PANEL
ALT: 14 U/L (ref 0–35)
AST: 24 U/L (ref 0–37)
Alkaline Phosphatase: 100 U/L (ref 39–117)
Creat: 0.97 mg/dL (ref 0.50–1.10)
Sodium: 140 mEq/L (ref 135–145)
Total Bilirubin: 0.3 mg/dL (ref 0.3–1.2)
Total Protein: 7.6 g/dL (ref 6.0–8.3)

## 2011-10-05 LAB — LIPID PANEL
LDL Cholesterol: 114 mg/dL — ABNORMAL HIGH (ref 0–99)
Total CHOL/HDL Ratio: 2.8 Ratio
VLDL: 14 mg/dL (ref 0–40)

## 2011-10-05 MED ORDER — FLUCONAZOLE 150 MG PO TABS: 150.0000 mg | ORAL_TABLET | Freq: Once | ORAL | Status: AC

## 2011-10-05 MED ORDER — ONDANSETRON HCL 4 MG PO TABS: 4.0000 mg | ORAL_TABLET | Freq: Every day | ORAL | Status: DC | PRN

## 2011-10-05 MED ORDER — ALBUTEROL SULFATE HFA 108 (90 BASE) MCG/ACT IN AERS: 1.0000 | INHALATION_SPRAY | RESPIRATORY_TRACT | Status: DC | PRN

## 2011-10-05 MED ORDER — TRAMADOL HCL 50 MG PO TABS: 50.0000 mg | ORAL_TABLET | Freq: Four times a day (QID) | ORAL | Status: DC | PRN

## 2011-10-05 MED ORDER — AMLODIPINE BESYLATE 10 MG PO TABS: 10.0000 mg | ORAL_TABLET | Freq: Every day | ORAL | Status: DC

## 2011-10-05 NOTE — Patient Instructions (Signed)
Schedule followup appointment with Dr. Arvilla Market in 2-3 weeks. Take a fluconazole pill for the itching.   Tramadol is a new medicine for pain. Use as directed. Zofran as a medicine to help with nausea. Use as directed If you notice any side effects or have other concerns, call the clinic at [phone].

## 2011-10-05 NOTE — Progress Notes (Signed)
Subjective:     Patient ID: Erika [name], female   DOB: 1940-09-30, 71 y.o.   MRN: [id number]  HPI Pt is here today for routine f/u.    She describes mild vaginal itching. She denies any abnormal bleeding, abnormal discharge, abnormal lesions, or pelvic pain.  She states she is taking all of her medications as directed and is not experiencing any adverse effects. She has no other concerns or complaints today. Review of Systems  Constitutional: Negative for fever, chills, diaphoresis, activity change, appetite change, fatigue and unexpected weight change.  HENT: Negative for hearing loss, congestion and neck stiffness.   Eyes: Negative for photophobia, pain and visual disturbance.  Respiratory: Negative for cough, chest tightness, shortness of breath and wheezing.   Cardiovascular: Negative for chest pain and palpitations.  Gastrointestinal: Negative for abdominal pain, blood in stool and anal bleeding.  Genitourinary: Negative for dysuria, hematuria and difficulty urinating.  Musculoskeletal: Negative for joint swelling.  Neurological: Negative for dizziness, syncope, speech difficulty, weakness, numbness and headaches.      Objective:   Physical Exam GEN: No apparent distress.  Alert and oriented x 3.  Pleasant, conversant, and cooperative to exam. HEENT: head is autraumatic and normocephalic.  Neck is supple without palpable masses or lymphadenopathy.  No JVD or carotid bruits.  Vision intact.  EOMI.  PERRLA.  Sclerae anicteric.  Conjunctivae without pallor or injection. Mucous membranes are moist.  Oropharynx is without erythema, exudates, or other abnormal lesions.  Dentition is good. RESP:  Lungs are clear to ascultation bilaterally with good air movement.  No wheezes, ronchi, or rubs. CARDIOVASCULAR:normal rate, slightly irregular rhythm.  Clear S1, S2, no murmurs, gallops, or rubs. ABDOMEN: soft, non-tender, non-distended.  Bowels sounds present in all quadrants and normoactive.   No palpable masses. EXT: warm and dry.  Peripheral pulses equal, intact, and +2 globally.  No clubbing or cyanosis.  Trace edema in bilateral lower extremities. SKIN: warm and dry with normal turgor.  No rashes or abnormal lesions observed. NEURO: CN II-XII grossly intact.  Muscle strength +5/5 in bilateral upper and lower extremities.  Sensation is grossly intact.  No focal deficit.    Assessment/Plan:

## 2011-10-18 DIAGNOSIS — M479 Spondylosis, unspecified: Secondary | ICD-10-CM | POA: Insufficient documentation

## 2011-10-18 DIAGNOSIS — I10 Essential (primary) hypertension: Secondary | ICD-10-CM | POA: Insufficient documentation

## 2011-10-18 NOTE — Assessment & Plan Note (Signed)
Will check lipid profile today as well as liver function tests. Will initiate treatment for dyslipidemia if indicated.

## 2011-10-18 NOTE — Assessment & Plan Note (Signed)
Blood pressure elevated slightly above goal however within acceptable limits. Will submit refill for Norvasc. Will check a comp metabolic panel today.

## 2011-10-18 NOTE — Assessment & Plan Note (Signed)
Will treat empirically for vaginal candidiasis with a one-time dose of fluconazole.  Patient is advised to return to clinic for further examination if her symptoms do not resolve after taking fluconazole. Patient agrees with this plan

## 2011-10-24 ENCOUNTER — Ambulatory Visit (INDEPENDENT_AMBULATORY_CARE_PROVIDER_SITE_OTHER): Payer: Medicare Other | Admitting: Internal Medicine

## 2011-10-24 ENCOUNTER — Encounter: Payer: Self-pay | Admitting: Internal Medicine

## 2011-10-24 VITALS — BP 126/77 | HR 85 | Temp 96.6°F | Ht 64.0 in | Wt 332.2 lb

## 2011-10-24 DIAGNOSIS — M479 Spondylosis, unspecified: Secondary | ICD-10-CM

## 2011-10-24 DIAGNOSIS — I10 Essential (primary) hypertension: Secondary | ICD-10-CM

## 2011-10-24 DIAGNOSIS — J45909 Unspecified asthma, uncomplicated: Secondary | ICD-10-CM

## 2011-10-24 MED ORDER — TRAMADOL HCL 50 MG PO TABS: 50.0000 mg | ORAL_TABLET | Freq: Four times a day (QID) | ORAL | Status: AC | PRN

## 2011-10-24 NOTE — Assessment & Plan Note (Signed)
Patient reports significant improvement in her ability to walk and do the things that she likes to do after starting tramadol for pain control. She feels her pain is very well controlled on her current regimen. She denies any adverse effects. We'll continue with her current dosing of tramadol.  Encouraged her to increase her physical activity now that the weather is warm; she states she plans on spending more time outside, more time playing with her grandson, and is also going to begin spending time in the pool.

## 2011-10-24 NOTE — Patient Instructions (Signed)
Schedule a followup appointment with Dr. Arvilla Market in [place]. Keep taking all of your medicine as directed. Have your friend check your blood pressure a few times before your next appointment. Right the numbers down and bring the record with you to your next office visit.   Keep up the good work! I look forward to seeing you soon!  (and hopefully in that red suit!)

## 2011-10-24 NOTE — Progress Notes (Signed)
Patient ID: Erika [name], female   DOB: [birth year], 71 y.o.   MRN: [id number]  Subjective:    HPI: Pt is a 71 year old female here today for routine f/u.      She states she is taking all of her medications as directed and is not experiencing any adverse effects. She has no other concerns or complaints today. Review of Systems  Constitutional: Negative for fever, chills, diaphoresis, activity change, appetite change, fatigue and unexpected weight change.  HENT: Negative for hearing loss, congestion and neck stiffness.   Eyes: Negative for photophobia, pain and visual disturbance.  Respiratory: Negative for cough, chest tightness, shortness of breath and wheezing.   Cardiovascular: Negative for chest pain and palpitations.  Gastrointestinal: Negative for abdominal pain, blood in stool and anal bleeding.  Genitourinary: Negative for dysuria, hematuria and difficulty urinating.  Musculoskeletal: Negative for joint swelling.  Neurological: Negative for dizziness, syncope, speech difficulty, weakness, numbness and headaches.      Objective:   Physical Exam GEN: No apparent distress.  Alert and oriented x 3.  Pleasant, conversant, and cooperative to exam. HEENT: head is autraumatic and normocephalic.  Neck is supple without palpable masses or lymphadenopathy.  No JVD or carotid bruits.  Vision intact.  EOMI.  PERRLA.  Sclerae anicteric.  Conjunctivae without pallor or injection. Mucous membranes are moist.  Oropharynx is without erythema, exudates, or other abnormal lesions.  Dentition is good. RESP:  Lungs are clear to ascultation bilaterally with good air movement.  No wheezes, ronchi, or rubs. CARDIOVASCULAR:normal rate, slightly irregular rhythm.  Clear S1, S2, no murmurs, gallops, or rubs. ABDOMEN: soft, non-tender, non-distended.  Bowels sounds present in all quadrants and normoactive.  No palpable masses. EXT: warm and dry.  Peripheral pulses equal, intact, and +2 globally.  No clubbing  or cyanosis.  Trace edema in bilateral lower extremities. SKIN: warm and dry with normal turgor.  No rashes or abnormal lesions observed. NEURO: CN II-XII grossly intact.  Muscle strength +5/5 in bilateral upper and lower extremities.  Sensation is grossly intact.  No focal deficit.    Assessment/Plan:

## 2011-10-24 NOTE — Assessment & Plan Note (Signed)
Stable. Patient denies increased wheezing, increased cough, increased shortness of breath, and has not required increased inhaler use.

## 2011-10-24 NOTE — Assessment & Plan Note (Signed)
Blood pressure is within acceptable limits however slightly above goal. I have asked her to record her blood pressure at home over the next few weeks and bring these measurements with her to her next office visit.  If her ambulatory measurements are above 145 systolic, will plan to add a second medication to her regimen, likely HCTZ.  Patient is agreeable to this plan.

## 2011-10-25 NOTE — Progress Notes (Signed)
Patient ID: Erika [name], female   DOB: 1940/09/26, 71 y.o.   MRN: [id number]   Repeat blood pressure obtained using a cuff on the upper arm (instead of wrist) revealed a systolic blood pressure of 126.  May not need to adjust patient's antihypertensive regimen as discussed in her plan from today's visit. I have asked her to continue with the plan to check her blood pressure at home using her friend's cuff and to bring these readings with her to her next office visit in May.  Patient agrees to this.

## 2011-11-20 ENCOUNTER — Other Ambulatory Visit: Payer: Self-pay | Admitting: *Deleted

## 2011-11-20 NOTE — Telephone Encounter (Signed)
Refill request from [facility] pharmacy for:  Triamterene-HCTZ 75-50mg  - Take 1/2 tab by mouth once daily. Last refilled 04/18/11.

## 2011-11-23 ENCOUNTER — Telehealth: Payer: Self-pay | Admitting: *Deleted

## 2011-11-23 NOTE — Telephone Encounter (Signed)
send all med refills as med refills.  I do not kn ow how to work with phone notes.

## 2011-11-23 NOTE — Telephone Encounter (Signed)
Pt would like refill on generic Maxzide ( on hx of meds ) sent to [facility] [place].

## 2011-11-26 MED ORDER — TRIAMTERENE-HCTZ 75-50 MG PO TABS: ORAL_TABLET | ORAL | Status: DC

## 2011-11-27 NOTE — Telephone Encounter (Signed)
We are obtaining home BP measurements on amlodipine alone; this is currently her only medication for HTN.  Her maxide should not be refilled at this time.  It looks like this may have been sent to her pharmacy.  Please cancel this rx and contact the patient to ensure she does not take this rx.  Thank you!

## 2011-12-03 NOTE — Telephone Encounter (Signed)
Left message on home phone ID recording - not to take generic Maxzide per Dr Arvilla Market while monitoring BP.

## 2011-12-26 ENCOUNTER — Encounter: Payer: Medicare Other | Admitting: Internal Medicine

## 2011-12-27 ENCOUNTER — Ambulatory Visit (INDEPENDENT_AMBULATORY_CARE_PROVIDER_SITE_OTHER): Payer: Medicare Other | Admitting: Internal Medicine

## 2011-12-27 ENCOUNTER — Encounter: Payer: Self-pay | Admitting: Internal Medicine

## 2011-12-27 DIAGNOSIS — J45909 Unspecified asthma, uncomplicated: Secondary | ICD-10-CM

## 2011-12-27 DIAGNOSIS — I10 Essential (primary) hypertension: Secondary | ICD-10-CM

## 2011-12-27 MED ORDER — TRAMADOL HCL 50 MG PO TABS: 50.0000 mg | ORAL_TABLET | Freq: Four times a day (QID) | ORAL | Status: DC | PRN

## 2011-12-27 MED ORDER — LORATADINE 10 MG PO TABS: 10.0000 mg | ORAL_TABLET | Freq: Every day | ORAL | Status: DC

## 2011-12-27 MED ORDER — ALBUTEROL SULFATE HFA 108 (90 BASE) MCG/ACT IN AERS: 2.0000 | INHALATION_SPRAY | Freq: Four times a day (QID) | RESPIRATORY_TRACT | Status: DC | PRN

## 2011-12-27 MED ORDER — ALBUTEROL SULFATE (5 MG/ML) 0.5% IN NEBU
2.5000 mg | INHALATION_SOLUTION | Freq: Once | RESPIRATORY_TRACT | Status: AC
  Administered 2011-12-27: 2.5 mg via RESPIRATORY_TRACT

## 2011-12-27 NOTE — Assessment & Plan Note (Signed)
Patient audibly wheezing today during exam. She has good air movement on exam and is not in acute respiratory distress. She notes she has had increased wheezing for the past week prior to her office visit that she believes is the result of "pollen."  She notes her symptoms of congestion are improved with use of over-the-counter pseudoephedrine and that her inhaler helps, though she does not like to use it because it leaves a bad taste in her mouth.  I asked patient to demonstrate use of her inhaler. She was fully inhaling before using her inhaler.  Instructed patient about appropriate inhaler technique and she was able to repeat this successfully. I will prescribe her a spacer to use with her albuterol. Will also give a nebulized treatment of albuterol today. At this point, do not feel that she needs a course of oral steroids however I advised her to return to clinic should she develop worsening shortness of breath, increased wheezing and increased dyspnea on exertion, worsening cough, fevers, chills, or other concerning symptoms.  Also advised her to begin taking loratadine as this will likely help her allergies and asthma.  She is a requests that her ventolin and be changed to pro air for insurance purposes; will make the change today,.

## 2011-12-27 NOTE — Patient Instructions (Signed)
Please come back in 1 month for a blood pressure check. We may need to start a second pill for your blood pressure Take loratadine (claritin) every day. Be sure to use your spacer with your inhaler. Keep taking your other medicines as directed.

## 2011-12-27 NOTE — Assessment & Plan Note (Signed)
Blood pressure above goal on arrival today and remained above goal with a systolic of 152 on repeat manual measurement.  She is currently using pseudoephedrine tablets for management of her allergies and congestion. This may be contributing to her elevated blood pressure.  She was unable to check her blood pressure at home as previously planned as her friends blood pressure cuff was nonfunctional.  Given the possibility that pseudoephedrine may be affecting her blood pressure, will have her followup in one month for repeat blood pressure check. If her blood pressure remains elevated at that time, we'll need to resume a second antihypertensive agent in addition to her amlodipine.

## 2011-12-27 NOTE — Progress Notes (Signed)
Patient ID: Erika [name], female   DOB: [birth year], 71 y.o.   MRN: [id number]  Subjective:    HPI  Erika [name] is a 71 y/o F presenting today for routine f/u.  Please see the A&P for the status of the pt's medical problems.   Review of Systems  Constitutional: Negative for fever, chills, diaphoresis, activity change, appetite change, fatigue and unexpected weight change.  HENT: Negative for hearing loss, congestion and neck stiffness.   Eyes: Negative for photophobia, pain and visual disturbance.  Respiratory: Negative for cough, chest tightness, shortness of breath and wheezing.   Cardiovascular: Negative for chest pain and palpitations.  Gastrointestinal: Negative for abdominal pain, blood in stool and anal bleeding.  Genitourinary: Negative for dysuria, hematuria and difficulty urinating.  Musculoskeletal: Negative for joint swelling.  Neurological: Negative for dizziness, syncope, speech difficulty, weakness, numbness and headaches.       Objective:   Physical Exam VItal signs reviewed.   GEN: No apparent distress.  Alert and oriented x 3.  Pleasant, conversant, and cooperative to exam. HEENT: head is autraumatic and normocephalic.  Neck is supple without palpable masses or lymphadenopathy.   Vision intact.  EOMI.  PERRLA.  Sclerae anicteric.  Conjunctivae without pallor or injection. Mucous membranes are moist.  Oropharynx is without erythema, exudates, or other abnormal lesions.  Dentition is good. RESP:  Expiratory wheezing noted diffusely with fair air movement in bilateral lung fields. No rhonchi, rubs, or crackles. CARDIOVASCULAR: Regular rate, normal rhythm.  Clear S1, S2, no murmurs, gallops, or rubs. ABDOMEN: soft, non-tender, non-distended.  Bowels sounds present in all quadrants and normoactive.  No palpable masses. EXT: warm and dry.  Peripheral pulses equal, intact, and +2 globally.  No clubbing or cyanosis.  No edema in bilateral lower extremities. SKIN: warm and  dry with normal turgor.  No rashes or abnormal lesions observed. NEURO: CN II-XII grossly intact.  Muscle strength +5/5 in bilateral upper and lower extremities.  Sensation is grossly intact.  No focal deficit.     Assessment & Plan:

## 2012-01-02 ENCOUNTER — Other Ambulatory Visit: Payer: Self-pay | Admitting: *Deleted

## 2012-01-02 MED ORDER — ALBUTEROL SULFATE HFA 108 (90 BASE) MCG/ACT IN AERS: 2.0000 | INHALATION_SPRAY | Freq: Four times a day (QID) | RESPIRATORY_TRACT | Status: DC | PRN

## 2012-01-02 NOTE — Telephone Encounter (Signed)
Pharmacy needs substitution permissible.

## 2012-01-29 ENCOUNTER — Ambulatory Visit (INDEPENDENT_AMBULATORY_CARE_PROVIDER_SITE_OTHER): Payer: Medicare Other | Admitting: Internal Medicine

## 2012-01-29 VITALS — BP 136/80 | HR 80 | Temp 97.7°F | Wt 333.4 lb

## 2012-01-29 DIAGNOSIS — M1710 Unilateral primary osteoarthritis, unspecified knee: Secondary | ICD-10-CM | POA: Insufficient documentation

## 2012-01-29 DIAGNOSIS — J45909 Unspecified asthma, uncomplicated: Secondary | ICD-10-CM

## 2012-01-29 DIAGNOSIS — M25562 Pain in left knee: Secondary | ICD-10-CM

## 2012-01-29 DIAGNOSIS — M179 Osteoarthritis of knee, unspecified: Secondary | ICD-10-CM | POA: Insufficient documentation

## 2012-01-29 DIAGNOSIS — I10 Essential (primary) hypertension: Secondary | ICD-10-CM

## 2012-01-29 DIAGNOSIS — M25569 Pain in unspecified knee: Secondary | ICD-10-CM

## 2012-01-29 NOTE — Progress Notes (Signed)
Subjective:     Patient ID: Erika [name], female   DOB: 1941-04-14, 71 y.o.   MRN: [id number]  HPI Patient is a very pleasant 71 year old woman with a history of asthma, hypertension, irregular heart rhythm, osteoarthritis who presents for blood pressure recheck.  Please see the patient's problem list for history, assessment and plan  Review of Systems No chest pain, breathing well,+ knee pain    Objective:   Physical Exam Gen: NAD, cooperative CV: RRR, 2/6 HSM across RUSB Chest: CTAB, good air movement L Knee: No erythema or edema. mild TTP over inferolateral patellar fat pad. Stable knee.      Assessment:         Plan:

## 2012-01-29 NOTE — Assessment & Plan Note (Signed)
Blood pressure today was 140/80, rechecked at 136/80. She is on amlodipine only. She is at her goal for blood pressure and does not require an additional agent at this time. She had previously been on triamterene/HCTZ, but seems to be doing well without it. She is not using pseudoephedrine with any consistency. - Continue amlodipine

## 2012-01-29 NOTE — Assessment & Plan Note (Signed)
Patient says she was getting into bed 2 days ago when she lifted her leg and felt a twinge of pain. The pain was in the lateral left knee. There was no swelling or erythema subsequently. She has been taking tramadol for pain and it has been relieving it. She feels overall the pain is improving.  Patient has mild tenderness to palpation on the inferior lateral fat pad of the anterior knee. Her pain may be a small fat pad inflammation versus mild osteoarthritis flare. It is improving and I do not think imaging is warranted at this time. - Continue tramadol - Consider x-ray if pain persists

## 2012-01-29 NOTE — Assessment & Plan Note (Signed)
Patient has no wheezes on lung exam, but can be heard breathing heavily from across the room, likely from obesity. She is taking her Advair daily, and has her albuterol when necessary. She did not start taking loratadine, because she felt improved without it. She did not get a spacer because her insurance would not pay for it. She has been trying to practice better inhaler technique. Now that there is less pollen and there is been some rain, her breathing and allergies are improved. - Continue Advair - Continue albuterol - Consider loratadine when necessary

## 2012-02-01 ENCOUNTER — Inpatient Hospital Stay (HOSPITAL_COMMUNITY)
Admission: EM | Admit: 2012-02-01 | Discharge: 2012-02-05 | DRG: 417 | Disposition: A | Discharge status: Home / Self Care | Payer: Medicare Other | Attending: General Surgery | Admitting: General Surgery

## 2012-02-01 ENCOUNTER — Other Ambulatory Visit: Payer: Self-pay

## 2012-02-01 ENCOUNTER — Emergency Department (HOSPITAL_COMMUNITY): Payer: Medicare Other

## 2012-02-01 ENCOUNTER — Encounter (HOSPITAL_COMMUNITY): Payer: Self-pay | Admitting: Emergency Medicine

## 2012-02-01 DIAGNOSIS — J9589 Other postprocedural complications and disorders of respiratory system, not elsewhere classified: Secondary | ICD-10-CM | POA: Diagnosis not present

## 2012-02-01 DIAGNOSIS — J4540 Moderate persistent asthma, uncomplicated: Secondary | ICD-10-CM | POA: Diagnosis present

## 2012-02-01 DIAGNOSIS — J9601 Acute respiratory failure with hypoxia: Secondary | ICD-10-CM | POA: Diagnosis not present

## 2012-02-01 DIAGNOSIS — I498 Other specified cardiac arrhythmias: Secondary | ICD-10-CM | POA: Diagnosis present

## 2012-02-01 DIAGNOSIS — E66813 Obesity, class 3: Secondary | ICD-10-CM | POA: Diagnosis present

## 2012-02-01 DIAGNOSIS — J9600 Acute respiratory failure, unspecified whether with hypoxia or hypercapnia: Secondary | ICD-10-CM | POA: Diagnosis not present

## 2012-02-01 DIAGNOSIS — K811 Chronic cholecystitis: Principal | ICD-10-CM | POA: Diagnosis present

## 2012-02-01 DIAGNOSIS — R000 Tachycardia, unspecified: Secondary | ICD-10-CM | POA: Diagnosis present

## 2012-02-01 DIAGNOSIS — I10 Essential (primary) hypertension: Secondary | ICD-10-CM | POA: Diagnosis present

## 2012-02-01 DIAGNOSIS — K8021 Calculus of gallbladder without cholecystitis with obstruction: Secondary | ICD-10-CM | POA: Diagnosis present

## 2012-02-01 DIAGNOSIS — E785 Hyperlipidemia, unspecified: Secondary | ICD-10-CM | POA: Diagnosis present

## 2012-02-01 DIAGNOSIS — J45909 Unspecified asthma, uncomplicated: Secondary | ICD-10-CM | POA: Diagnosis present

## 2012-02-01 DIAGNOSIS — E669 Obesity, unspecified: Secondary | ICD-10-CM | POA: Diagnosis present

## 2012-02-01 DIAGNOSIS — K8066 Calculus of gallbladder and bile duct with acute and chronic cholecystitis without obstruction: Secondary | ICD-10-CM

## 2012-02-01 DIAGNOSIS — Z6841 Body Mass Index (BMI) 40.0 and over, adult: Secondary | ICD-10-CM

## 2012-02-01 HISTORY — DX: Anemia, unspecified: D64.9

## 2012-02-01 LAB — CBC WITH DIFFERENTIAL/PLATELET
Eosinophils Absolute: 0 10*3/uL (ref 0.0–0.7)
Eosinophils Relative: 0 % (ref 0–5)
Hemoglobin: 14.5 g/dL (ref 12.0–15.0)
Lymphs Abs: 0.6 10*3/uL — ABNORMAL LOW (ref 0.7–4.0)
MCH: 30 pg (ref 26.0–34.0)
MCHC: 33.5 g/dL (ref 30.0–36.0)
MCV: 89.6 fL (ref 78.0–100.0)
Monocytes Relative: 5 % (ref 3–12)
RBC: 4.83 MIL/uL (ref 3.87–5.11)

## 2012-02-01 LAB — CARDIAC PANEL(CRET KIN+CKTOT+MB+TROPI)
CK, MB: 3.7 ng/mL (ref 0.3–4.0)
Relative Index: 1.6 (ref 0.0–2.5)
Total CK: 228 U/L — ABNORMAL HIGH (ref 7–177)
Troponin I: <0.3 ng/mL (ref <0.30)

## 2012-02-01 LAB — COMPREHENSIVE METABOLIC PANEL
BUN: 10 mg/dL (ref 6–23)
Calcium: 9.5 mg/dL (ref 8.4–10.5)
Creatinine, Ser: 0.74 mg/dL (ref 0.50–1.10)
GFR calc Af Amer: >90 mL/min (ref >90)
Glucose, Bld: 119 mg/dL — ABNORMAL HIGH (ref 70–99)
Total Protein: 8.9 g/dL — ABNORMAL HIGH (ref 6.0–8.3)

## 2012-02-01 LAB — URINALYSIS, ROUTINE W REFLEX MICROSCOPIC
Bilirubin Urine: NEGATIVE
Leukocytes, UA: NEGATIVE
Nitrite: NEGATIVE
Specific Gravity, Urine: 1.012 (ref 1.005–1.030)
Urobilinogen, UA: 0.2 mg/dL (ref 0.0–1.0)

## 2012-02-01 LAB — TROPONIN I: Troponin I: <0.3 ng/mL (ref <0.30)

## 2012-02-01 LAB — URINE MICROSCOPIC-ADD ON

## 2012-02-01 LAB — LIPASE, BLOOD: Lipase: 13 U/L (ref 11–59)

## 2012-02-01 LAB — PROTIME-INR: INR: 1.05 (ref 0.00–1.49)

## 2012-02-01 MED ORDER — ONDANSETRON HCL 4 MG/2ML IJ SOLN: 4.0000 mg | Freq: Three times a day (TID) | INTRAMUSCULAR | Status: DC | PRN

## 2012-02-01 MED ORDER — HYDROMORPHONE HCL PF 1 MG/ML IJ SOLN
1.0000 mg | Freq: Once | INTRAMUSCULAR | Status: AC
  Administered 2012-02-01: 1 mg via INTRAVENOUS
  Filled 2012-02-01: qty 1

## 2012-02-01 MED ORDER — ALBUTEROL SULFATE HFA 108 (90 BASE) MCG/ACT IN AERS: 2.0000 | INHALATION_SPRAY | RESPIRATORY_TRACT | Status: DC | PRN

## 2012-02-01 MED ORDER — FLUTICASONE-SALMETEROL 250-50 MCG/DOSE IN AEPB
1.0000 | INHALATION_SPRAY | Freq: Two times a day (BID) | RESPIRATORY_TRACT | Status: DC
  Administered 2012-02-01 – 2012-02-02 (×2): 1 via RESPIRATORY_TRACT
  Filled 2012-02-01: qty 14

## 2012-02-01 MED ORDER — ALBUTEROL SULFATE (5 MG/ML) 0.5% IN NEBU
5.0000 mg | INHALATION_SOLUTION | Freq: Once | RESPIRATORY_TRACT | Status: AC
  Administered 2012-02-01: 5 mg via RESPIRATORY_TRACT
  Filled 2012-02-01: qty 0.5

## 2012-02-01 MED ORDER — SODIUM CHLORIDE 0.9 % IV SOLN
INTRAVENOUS | Status: AC
  Administered 2012-02-01: 17:00:00 via INTRAVENOUS

## 2012-02-01 MED ORDER — ENOXAPARIN SODIUM 40 MG/0.4ML ~~LOC~~ SOLN
40.0000 mg | SUBCUTANEOUS | Status: DC
  Administered 2012-02-01 – 2012-02-04 (×4): 40 mg via SUBCUTANEOUS
  Filled 2012-02-01 (×5): qty 0.4

## 2012-02-01 MED ORDER — SODIUM CHLORIDE 0.9 % IV SOLN
INTRAVENOUS | Status: DC
  Administered 2012-02-02 (×2): via INTRAVENOUS
  Administered 2012-02-03: 50 mL/h via INTRAVENOUS

## 2012-02-01 MED ORDER — IPRATROPIUM BROMIDE 0.02 % IN SOLN
0.5000 mg | Freq: Once | RESPIRATORY_TRACT | Status: AC
  Administered 2012-02-01: 0.5 mg via RESPIRATORY_TRACT
  Filled 2012-02-01: qty 2.5

## 2012-02-01 MED ORDER — BIOTENE DRY MOUTH MT LIQD
15.0000 mL | Freq: Two times a day (BID) | OROMUCOSAL | Status: DC
  Administered 2012-02-01: 15 mL via OROMUCOSAL

## 2012-02-01 MED ORDER — ONDANSETRON HCL 4 MG/2ML IJ SOLN
4.0000 mg | Freq: Four times a day (QID) | INTRAMUSCULAR | Status: DC | PRN
  Administered 2012-02-01: 4 mg via INTRAVENOUS
  Filled 2012-02-01: qty 2

## 2012-02-01 MED ORDER — SODIUM CHLORIDE 0.9 % IV BOLUS (SEPSIS)
1000.0000 mL | Freq: Once | INTRAVENOUS | Status: AC
  Administered 2012-02-01: 1000 mL via INTRAVENOUS

## 2012-02-01 MED ORDER — CHLORHEXIDINE GLUCONATE 0.12 % MT SOLN
15.0000 mL | Freq: Two times a day (BID) | OROMUCOSAL | Status: DC
  Administered 2012-02-02 – 2012-02-03 (×3): 15 mL via OROMUCOSAL
  Filled 2012-02-01 (×3): qty 15

## 2012-02-01 MED ORDER — ONDANSETRON HCL 4 MG/2ML IJ SOLN
4.0000 mg | Freq: Once | INTRAMUSCULAR | Status: AC
  Administered 2012-02-01: 4 mg via INTRAVENOUS
  Filled 2012-02-01: qty 2

## 2012-02-01 MED ORDER — ONDANSETRON HCL 4 MG PO TABS: 4.0000 mg | ORAL_TABLET | Freq: Four times a day (QID) | ORAL | Status: DC | PRN

## 2012-02-01 MED ORDER — HYDROMORPHONE HCL PF 1 MG/ML IJ SOLN
1.0000 mg | INTRAMUSCULAR | Status: AC | PRN
  Administered 2012-02-01 (×2): 1 mg via INTRAVENOUS
  Filled 2012-02-01 (×2): qty 1

## 2012-02-01 NOTE — Consult Note (Signed)
Reason for Consult:epigastric abdominal pain, cholelithiasis Referring Physician: Dr. Margarito Liner  Erika [name] is an 71 y.o. female.  HPI: this is a 71 year old female admitted by the medical teaching service for epigastric abdominal pain. The patient reports he had the sudden onset of pain in her epigastrium and right upper quadrant this morning. She has had no previous history of pain. She described the pain as sharp and continuous. She also has had nausea and vomiting. She now is feeling slightly better. She denies fevers or chills. She is otherwise without complaints.  Past Medical History  Diagnosis Date  . Hypertension   . Tuberculosis     active TB treated in 2002, hx of paraspinal lumbar TB,   . Obesity     BMI 54  . Menopause   . Breast cyst     Excesion with FNA, begnin in 2004.   [name] Shingles   . Asthma   . SVT (supraventricular tachycardia) June 2009    one run while hospitalized  . Back pain     status post surgery 2002  . Stasis dermatitis     W/ LE edema, prviously on lasix now on mazxide.   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  . Lymphadenopathy     Of the mediastinum, Right side CXR 2008, not read on 2010 cxr.   [name] History of shingles     Recurrent with post herpetic neuralgia.   . Degenerative joint disease of spine     Imaging 2005,  Degenerative hypertrophic facet arthritis changes L4-5 and L5-S1.[name]     Past Surgical History  Procedure Date  . Hemilaminectomy of l4, l5 and s1 decompression of tumor in epidural space 2000  . Breast cyst biopsy     Family History  Problem Relation Age of Onset  . Stroke Mother     at young age  . Stroke Father     in 86's  . Stroke Brother   . Stroke Brother     Social History:  reports that she has never smoked. She has never used smokeless tobacco. She reports that she does not drink alcohol or use illicit drugs.  Allergies:  Allergies  Allergen Reactions  . Prednisone Other (See Comments)    REACTION:  Psychosis, talking out of head, insomnia    Medications: I have reviewed the patient's current medications.  Results for orders placed during the hospital encounter of 02/01/12 (from the past 48 hour(s))  CBC WITH DIFFERENTIAL     Status: Abnormal   Collection Time   02/01/12 11:45 AM      Component Value Range Comment   WBC 9.4  4.0 - 10.5 K/uL    RBC 4.83  3.87 - 5.11 MIL/uL    Hemoglobin 14.5  12.0 - 15.0 g/dL    HCT 82.9  56.2 - 13.0 %    MCV 89.6  78.0 - 100.0 fL    MCH 30.0  26.0 - 34.0 pg    MCHC 33.5  30.0 - 36.0 g/dL    RDW 86.5  78.4 - 69.6 %    Platelets 204  150 - 400 K/uL    Neutrophils Relative 89 (*) 43 - 77 %    Neutro Abs 8.4 (*) 1.7 - 7.7 K/uL    Lymphocytes Relative 6 (*) 12 - 46 %    Lymphs Abs 0.6 (*) 0.7 - 4.0 K/uL    Monocytes Relative 5  3 - 12 %  Monocytes Absolute 0.4  0.1 - 1.0 K/uL    Eosinophils Relative 0  0 - 5 %    Eosinophils Absolute 0.0  0.0 - 0.7 K/uL    Basophils Relative 0  0 - 1 %    Basophils Absolute 0.0  0.0 - 0.1 K/uL   COMPREHENSIVE METABOLIC PANEL     Status: Abnormal   Collection Time   02/01/12 11:45 AM      Component Value Range Comment   Sodium 131 (*) 135 - 145 mEq/L    Potassium 3.5  3.5 - 5.1 mEq/L    Chloride 95 (*) 96 - 112 mEq/L    CO2 23  19 - 32 mEq/L    Glucose, Bld 119 (*) 70 - 99 mg/dL    BUN 10  6 - 23 mg/dL    Creatinine, Ser 1.61  0.50 - 1.10 mg/dL    Calcium 9.5  8.4 - 09.6 mg/dL    Total Protein 8.9 (*) 6.0 - 8.3 g/dL    Albumin 3.7  3.5 - 5.2 g/dL    AST 045 (*) 0 - 37 U/L    ALT 83 (*) 0 - 35 U/L    Alkaline Phosphatase 160 (*) 39 - 117 U/L    Total Bilirubin 0.8  0.3 - 1.2 mg/dL    GFR calc non Af Amer 85 (*) >90 mL/min    GFR calc Af Amer >90  >90 mL/min   LIPASE, BLOOD     Status: Normal   Collection Time   02/01/12 11:45 AM      Component Value Range Comment   Lipase 13  11 - 59 U/L   TROPONIN I     Status: Normal   Collection Time   02/01/12 11:45 AM      Component Value Range Comment    Troponin I <0.30  <0.30 ng/mL   URINALYSIS, ROUTINE W REFLEX MICROSCOPIC     Status: Abnormal   Collection Time   02/01/12  1:10 PM      Component Value Range Comment   Color, Urine YELLOW  YELLOW    APPearance CLEAR  CLEAR    Specific Gravity, Urine 1.012  1.005 - 1.030    pH 8.0  5.0 - 8.0    Glucose, UA NEGATIVE  NEGATIVE mg/dL    Hgb urine dipstick TRACE (*) NEGATIVE    Bilirubin Urine NEGATIVE  NEGATIVE    Ketones, ur NEGATIVE  NEGATIVE mg/dL    Protein, ur NEGATIVE  NEGATIVE mg/dL    Urobilinogen, UA 0.2  0.0 - 1.0 mg/dL    Nitrite NEGATIVE  NEGATIVE    Leukocytes, UA NEGATIVE  NEGATIVE   URINE MICROSCOPIC-ADD ON     Status: Abnormal   Collection Time   02/01/12  1:10 PM      Component Value Range Comment   Squamous Epithelial / LPF FEW (*) RARE    WBC, UA 0-2  <3 WBC/hpf    RBC / HPF 7-10  <3 RBC/hpf    Bacteria, UA FEW (*) RARE     US Abdomen Complete  02/01/2012  *RADIOLOGY REPORT*  Clinical Data:  Abdominal pain.  COMPLETE ABDOMINAL ULTRASOUND  Comparison:  None.  Findings:  Gallbladder:  A few small mobile gallstones noted.  No wall thickening.  Negative sonographic Murphy's.  Common bile duct:   Mildly dilated, 9 mm.  Cannot visualize the distal common bile duct to exclude distal stone.  Liver:  Suspect slight intrahepatic biliary ductal dilatation.  No focal abnormality.  Normal echotexture.  IVC:  Appears normal.  Pancreas:  No focal abnormality seen.  Spleen:  Within normal limits in size and echotexture.  Right Kidney:   Normal in size and parenchymal echogenicity.  No evidence of mass or hydronephrosis.  Left Kidney:  Normal in size and parenchymal echogenicity.  No evidence of mass or hydronephrosis.  Abdominal aorta:  No aneurysm identified.  IMPRESSION: A few small gravel-like stones in the gallbladder.  No changes of acute cholecystitis.  Mild intrahepatic and extrahepatic biliary ductal dilatation.  The distal common bile duct cannot be visualized due to overlying  bowel gas.  I cannot exclude a distal obstructing CBD stone.  Recommend correlation with LFTs.  If further evaluation is felt warranted, MRCP may be beneficial.  Original Report Authenticated By: Cyndie Chime, M.D.    Review of Systems  Constitutional: Negative for fever and chills.  HENT: Negative for congestion.   Eyes: Negative.   Respiratory: Negative for cough, shortness of breath and stridor.   Cardiovascular: Negative for chest pain and palpitations.  Gastrointestinal: Positive for nausea, vomiting and abdominal pain. Negative for diarrhea.  Musculoskeletal: Positive for joint pain.  Skin: Negative for itching and rash.  Neurological: Negative.  Negative for headaches.  Endo/Heme/Allergies: Negative.   Psychiatric/Behavioral: Negative.    Blood pressure 105/57, pulse 108, temperature 98.8 F (37.1 C), temperature source Oral, resp. rate 18, height 5\' 8"  (1.727 m), weight 333 lb (151.048 kg), SpO2 93.00%. Physical Exam  Constitutional: She is oriented to person, place, and time. No distress.       Morbidly obese female in no acute distress  HENT:  Head: Normocephalic and atraumatic.  Right Ear: External ear normal.  Left Ear: External ear normal.  Nose: Nose normal.  Mouth/Throat: Oropharynx is clear and moist. No oropharyngeal exudate.  Eyes: Conjunctivae are normal. Pupils are equal, round, and reactive to light. Right eye exhibits no discharge. Left eye exhibits no discharge. No scleral icterus.  Neck: Normal range of motion. Neck supple. No tracheal deviation present. No thyromegaly present.  Cardiovascular: Normal rate, regular rhythm, normal heart sounds and intact distal pulses.   No murmur heard. Respiratory: Effort normal and breath sounds normal. No respiratory distress. She has no wheezes.  GI: Soft. She exhibits no distension. There is tenderness. There is guarding.       Morbidly obese abdomen with moderate tenderness in the epigastrium and right upper quadrant    Musculoskeletal: Normal range of motion. She exhibits edema. She exhibits no tenderness.  Lymphadenopathy:    She has no cervical adenopathy.  Neurological: She is alert and oriented to person, place, and time.  Skin: Skin is warm and dry. No rash noted. She is not diaphoretic. No erythema.  Psychiatric: Her behavior is normal.    Assessment/Plan: Symptomatic cholelithiasis and possible CBD stone.  She is improving from a symptomatology standpoint. I will make her n.p.o. After midnight. I will check her liver function tests in the morning. If they are improving, we may proceed with left-sided cholecystectomy cholangiogram. If her liver function tests increase, she may need preoperative ERCP. Thank you for this consult. We will follow her closely  Kristin Lamagna A 02/01/2012, 7:03 PM

## 2012-02-01 NOTE — ED Provider Notes (Signed)
See prior note   Ward Givens, MD 02/01/12 1313

## 2012-02-01 NOTE — ED Provider Notes (Signed)
History     CSN: [id number]  Arrival date & time 02/01/12  1021   First MD Initiated Contact with Patient 02/01/12 1031      Chief Complaint  Patient presents with  . Abdominal Pain    (Consider location/radiation/quality/duration/timing/severity/associated sxs/prior treatment) HPI  70 year old female presents complaining of abdominal pain. Patient states she was awoke this morning due to pain to the epigastrium and her right upper quadrant. Pain is described as an achy sensation, nonradiating, with associated nausea and vomiting. She vomited 3 bouts of nonbloody nonbilious food content. She also had 2 bouts of normal stool this a.m. the pain is nonradiating without associated chest pain, shortness of breath, back pain, or urinary symptoms. She denies fever, chills, sore throat. Patient reports she was eating a lot of food yesterday. She denies any prior symptoms similar to this. States her gallbladder is intact and denies any previous abdominal surgery. She does have history of shingle in 2003 and states this pain feels somewhat similar. However, she denies any increased stress, or notes any rash. Patient has not tried anything to alleviate her symptoms. Pain is worse, nothing makes it better.  Past Medical History  Diagnosis Date  . Hypertension   . Tuberculosis     active TB treated in 2002, hx of paraspinal lumbar TB,   . Obesity     BMI 54  . Menopause   . Breast cyst     Excesion with FNA, begnin in 2004.   [name] Shingles   . Asthma   . SVT (supraventricular tachycardia) June 2009    one run while hospitalized  . Back pain     status post surgery 2002  . Stasis dermatitis     W/ LE edema, prviously on lasix now on mazxide.   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  . Lymphadenopathy     Of the mediastinum, Right side CXR 2008, not read on 2010 cxr.   [name] History of shingles     Recurrent with post herpetic neuralgia.   . Degenerative joint disease of spine     Imaging  2005,  Degenerative hypertrophic facet arthritis changes L4-5 and L5-S1.[name]     Past Surgical History  Procedure Date  . Hemilaminectomy of l4, l5 and s1 decompression of tumor in epidural space 2000  . Breast cyst biopsy     Family History  Problem Relation Age of Onset  . Stroke Mother     at young age  . Stroke Father     in 71's  . Stroke Brother   . Stroke Brother     History  Substance Use Topics  . Smoking status: Never Smoker   . Smokeless tobacco: Never Used  . Alcohol Use: No    OB History    Grav Para Term Preterm Abortions TAB SAB Ect Mult Living                  Review of Systems  All other systems reviewed and are negative.    Allergies  Prednisone  Home Medications   Current Outpatient Rx  Name Route Sig Dispense Refill  . ALBUTEROL SULFATE HFA 108 (90 BASE) MCG/ACT IN AERS Inhalation Inhale 2 puffs into the lungs every 6 (six) hours as needed for wheezing. 8.5 g 6  . AMLODIPINE BESYLATE 10 MG PO TABS Oral Take 1 tablet (10 mg total) by mouth daily. 90 tablet 4  . ASPIRIN EC 81 MG PO TBEC Oral Take  1 tablet (81 mg total) by mouth daily. 150 tablet 0  . FLUTICASONE-SALMETEROL 250-50 MCG/DOSE IN AEPB Inhalation Inhale 1 puff into the lungs 2 (two) times daily. 60 each 3  . LORATADINE 10 MG PO TABS Oral Take 1 tablet (10 mg total) by mouth daily. 90 tablet 2  . OMEPRAZOLE 20 MG PO CPDR Oral Take 1 capsule (20 mg total) by mouth daily. 30 capsule 11  . OVER THE COUNTER MEDICATION  SINUS RELIEF  AS NEEDED     . TRAMADOL HCL 50 MG PO TABS Oral Take 1-2 tablets (50-100 mg total) by mouth every 6 (six) hours as needed for pain. 90 tablet 3  . TRIAMTERENE-HCTZ 75-50 MG PO TABS  Take one half tablet a day 30 tablet 11    BP 137/83  Pulse 96  Temp 97.8 F (36.6 C) (Oral)  Resp 18  SpO2 96%  Physical Exam  Nursing note and vitals reviewed. Constitutional: She appears well-developed and well-nourished. No distress.       Morbidly obese  HENT:  Head:  Atraumatic.  Eyes: Conjunctivae are normal. Right eye exhibits no discharge. Left eye exhibits no discharge.  Neck: Neck supple.  Cardiovascular: Normal rate and regular rhythm.  Exam reveals no gallop and no friction rub.   No murmur heard. Pulmonary/Chest: Effort normal. No respiratory distress. She exhibits no tenderness.       Diffuse scattered rhonchi without wheezes or rales  Abdominal: Soft. There is tenderness. There is no rebound.       Right upper quadrant point tenderness without guarding or rebound tenderness. Positive Murphy sign. Epigastric tenderness on palpation without overlying skin changes. No obvious hernia noted. Negative McBurney's point. No CVA tenderness.  Musculoskeletal: She exhibits no tenderness.       ROM appears intact, no obvious focal weakness  Neurological:       Mental status and motor strength appears intact  Skin: No rash noted.  Psychiatric: She has a normal mood and affect.    ED Course  Procedures (including critical care time)   Labs Reviewed  SPECIMEN HOLD   No results found.   No diagnosis found.   Date: 02/01/2012  Rate: 87  Rhythm: normal sinus rhythm  QRS Axis: normal  Intervals: normal  ST/T Wave abnormalities: normal  Conduction Disutrbances:Multiple PVCs  Narrative Interpretation:   Old EKG Reviewed: changes noted  Results for orders placed during the hospital encounter of 02/01/12  CBC WITH DIFFERENTIAL      Component Value Range   WBC 9.4  4.0 - 10.5 K/uL   RBC 4.83  3.87 - 5.11 MIL/uL   Hemoglobin 14.5  12.0 - 15.0 g/dL   HCT 96.0  45.4 - 09.8 %   MCV 89.6  78.0 - 100.0 fL   MCH 30.0  26.0 - 34.0 pg   MCHC 33.5  30.0 - 36.0 g/dL   RDW 11.9  14.7 - 82.9 %   Platelets 204  150 - 400 K/uL   Neutrophils Relative 89 (*) 43 - 77 %   Neutro Abs 8.4 (*) 1.7 - 7.7 K/uL   Lymphocytes Relative 6 (*) 12 - 46 %   Lymphs Abs 0.6 (*) 0.7 - 4.0 K/uL   Monocytes Relative 5  3 - 12 %   Monocytes Absolute 0.4  0.1 - 1.0 K/uL    Eosinophils Relative 0  0 - 5 %   Eosinophils Absolute 0.0  0.0 - 0.7 K/uL   Basophils Relative 0  0 -  1 %   Basophils Absolute 0.0  0.0 - 0.1 K/uL  COMPREHENSIVE METABOLIC PANEL      Component Value Range   Sodium 131 (*) 135 - 145 mEq/L   Potassium 3.5  3.5 - 5.1 mEq/L   Chloride 95 (*) 96 - 112 mEq/L   CO2 23  19 - 32 mEq/L   Glucose, Bld 119 (*) 70 - 99 mg/dL   BUN 10  6 - 23 mg/dL   Creatinine, Ser 7.84  0.50 - 1.10 mg/dL   Calcium 9.5  8.4 - 69.6 mg/dL   Total Protein 8.9 (*) 6.0 - 8.3 g/dL   Albumin 3.7  3.5 - 5.2 g/dL   AST 295 (*) 0 - 37 U/L   ALT 83 (*) 0 - 35 U/L   Alkaline Phosphatase 160 (*) 39 - 117 U/L   Total Bilirubin 0.8  0.3 - 1.2 mg/dL   GFR calc non Af Amer 85 (*) >90 mL/min   GFR calc Af Amer >90  >90 mL/min  LIPASE, BLOOD      Component Value Range   Lipase 13  11 - 59 U/L  TROPONIN I      Component Value Range   Troponin I <0.30  <0.30 ng/mL   US Abdomen Complete  02/01/2012  *RADIOLOGY REPORT*  Clinical Data:  Abdominal pain.  COMPLETE ABDOMINAL ULTRASOUND  Comparison:  None.  Findings:  Gallbladder:  A few small mobile gallstones noted.  No wall thickening.  Negative sonographic Murphy's.  Common bile duct:   Mildly dilated, 9 mm.  Cannot visualize the distal common bile duct to exclude distal stone.  Liver:  Suspect slight intrahepatic biliary ductal dilatation.  No focal abnormality.  Normal echotexture.  IVC:  Appears normal.  Pancreas:  No focal abnormality seen.  Spleen:  Within normal limits in size and echotexture.  Right Kidney:   Normal in size and parenchymal echogenicity.  No evidence of mass or hydronephrosis.  Left Kidney:  Normal in size and parenchymal echogenicity.  No evidence of mass or hydronephrosis.  Abdominal aorta:  No aneurysm identified.  IMPRESSION: A few small gravel-like stones in the gallbladder.  No changes of acute cholecystitis.  Mild intrahepatic and extrahepatic biliary ductal dilatation.  The distal common bile duct cannot be  visualized due to overlying bowel gas.  I cannot exclude a distal obstructing CBD stone.  Recommend correlation with LFTs.  If further evaluation is felt warranted, MRCP may be beneficial.  Original Report Authenticated By: Cyndie Chime, M.D.      MDM  Right upper quadrant abdominal pain suggestive of gallbladder etiology. Workup initiated. Discussed with my attending.  12:28 PM Ultrasounds of the abdomen shows gallstones without evidence of acute cholecystitis. mild intrahepatic and extrahepatic biliary ductal dilatation.  Cannot exclude distal common bile duct obstruction stone. Her labs reviewed, is remarkable for an AST of 183, ALT of 83, an alkaline phosphatase of 160. Plan to call for admission.   1:11 PM I have consulted with OPC who agrees to admit pt for further care of potential CBD stone.  Pt stable to be transfer, her VSS, afebrile.    Fayrene Helper, PA-C 02/01/12 1312

## 2012-02-01 NOTE — ED Notes (Signed)
Report called to Aurea Graff, RN on 5100 at [facility]. dph

## 2012-02-01 NOTE — ED Provider Notes (Signed)
Patient relates she woke up at 4 AM this morning with epigastric abdominal pain that did not radiate to her back. She has had nausea and vomiting without diarrhea. She states she's never had this pain before.  The patient is morbidly obese, she's tender in epigastric and right upper quadrant.   Medical screening examination/treatment/procedure(s) were conducted as a shared visit with non-physician practitioner(s) and myself.  I personally evaluated the patient during the encounter  Devoria Albe, MD, Franz Dell, MD 02/01/12 [phone]

## 2012-02-01 NOTE — H&P (Signed)
Hospital Admission Note Date: 02/01/2012  Patient name: Erika [name] Medical record number: [id number] Date of birth: Dec 24, 1940 Age: 71 y.o. Gender: female PCP: Nelda Bucks, MD  Medical Service: Internal medicine teaching service  Attending physician: Dr Rogelia Boga    1st Contact: Dr Brien Few    Pager: [phone] 2nd Contact: Dr Loistine Chance   Pager: [phone]  After 5 pm or weekends: 1st Contact:      Pager: [phone] 2nd Contact:      Pager: [phone]  Chief Complaint: Abdominal pain  History of Present Illness:   This is a 71 year old female with past medical history significant for hypertension, obesity, hyperlipidemia,  who presented to the [facility] long ED with epigastric and right upper quadrant abdominal pain.   Patient reports she was in her usual state until 4 AM,  when she started having severe abdominal pain. It is located at the epigastric and right upper quadrant area. It is 10 out of 10 in severity as if she was giving birth, nonradiating and constant with intermittent accentuation. It is not aggravated or alleviated with any known factors. It is associated with nausea and vomiting x3 (nonbloody, nonbilious).  She denies any changes in diet, diarrhea, constipation, flank pain, changes in urinary habits.  Patient reports that she has never experienced similar symptoms in the past. Denies any recent travel or sick contact.   Medications Prior to Admission  Medication Sig Dispense Refill  . albuterol (PROAIR HFA) 108 (90 BASE) MCG/ACT inhaler  Inhale 2 puffs into the lungs every 6 (six) hours as needed for wheezing.  8.5 g  6  . amLODipine (NORVASC) 10 MG tablet  Take 1 tablet (10 mg total) by mouth daily.  90 tablet  4  . aspirin EC 81 MG tablet Take 1 tablet (81 mg total) by mouth daily.  150 tablet  0  . Fluticasone-Salmeterol (ADVAIR DISKUS) 250-50 MCG/DOSE AEPB  Inhale 1 puff into the lungs 2 (two) times daily.  60 each  3  . omeprazole (PRILOSEC) 20 MG capsule  Take 1 capsule  (20 mg total) by mouth daily.  30 capsule  11  . traMADol (ULTRAM) 50 MG tablet Take 1-2 tablets (50-100 mg total) by mouth every 6 (six) hours as needed for pain.  90 tablet  3    Allergies: Allergies as of 02/01/2012 - Review Complete 02/01/2012  Allergen Reaction Noted  . Prednisone- psychotic symptoms Other (See Comments) 02/26/2008   Past Medical History  Diagnosis Date  . Hypertension    . Tuberculosis     active TB treated in 2002, hx of paraspinal lumbar TB,    . Obesity     BMI 54      . Breast cyst     Excesion with FNA, begnin in 2004.    [name] Shingles    . Asthma    . SVT (supraventricular tachycardia) June 2009    one run while hospitalized   . Back pain     status post surgery 2002   . Stasis dermatitis     W/ LE edema, prviously on lasix now on mazxide.    .        . History of shingles      Recurrent with post herpetic neuralgia.    . Degenerative joint disease of spine     Imaging 2005,  Degenerative hypertrophic facet arthritis changes L4-5 and L5-S1.[name]    Past Surgical History  Procedure Date  . Hemilaminectomy of l4, l5 and s1 decompression of  tumor in epidural space  2000  . Breast cyst biopsy    Family history:   Mother: Died of stroke at age of 43, had hypertension before death  Father: died of heart attack at age of 43, had hypertension before death  Brother: Has a stroke, hypertension and diabetes  Sister: Died of uterine cancer at age of 32  Social history:  Widowed, lives with her son in [place], has 3 sons and one daughter who are all healthy. 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 Not smoking, drinking alcohol and not using drugs.   Review of Systems: Positive if bold Constitutional: fever, chills, diaphoresis, appetite change and fatigue.  HEENT:  congestion, sore throat, mouth sores, trouble swallowing, neck pain, neck stiffness and tinnitus.   Respiratory:  SOB, DOE, cough, chest tightness,  and wheezing.   Cardiovascular:   chest pain, palpitations and leg swelling.  Gastrointestinal:  nausea, vomiting, abdominal pain, diarrhea, constipation, blood in stool and abdominal distention.  Genitourinary: dysuria, urgency, frequency, hematuria, flank pain and difficulty urinating.  Musculoskeletal: Denies myalgias, back pain,  Skin: Denies pallor, rash and wound.  Neurological: Denies dizziness,  weakness, light-headedness, numbness and headaches.   Physical Exam:  Blood pressure 105/57, pulse 108, T:  98.8 F, RR 18, SpO2 93.00%.  Constitutional: Vital signs reviewed.  Patient is a obese and well-nourished  in no acute distress and cooperative with exam. Alert and oriented x3.  Mouth: no erythema or exudates, MMM  Neck: Supple,  Cardiovascular: RRR, S1 normal, S2 normal, no MRG, pulses symmetric and intact bilaterally Pulmonary/Chest: CTAB, no wheezes, rales, or rhonchi Abdominal: Soft. Obese . Tender ove epigastric and right upper quadrant on palpation, no guarding or rebound pain, Murphy's sign positive. Bowel sounds are normal GU: no CVA tenderness. Musculoskeletal: No  ROM full and nontender Neurological: A&O x3, no focal motor deficit, sensory intact to light touch bilaterally.  Skin: Warm, dry and intact. No rash, cyanosis, or clubbing.   Lab results: Basic Metabolic Panel:  Basename 02/01/12 1145  NA 131*  K 3.5  CL 95*  CO2 23  GLUCOSE 119*  BUN 10  CREATININE 0.74  CALCIUM 9.5  MG --  PHOS --   Liver Function Tests:  Basename 02/01/12 1145  AST 183*  ALT 83*  ALKPHOS 160*  BILITOT 0.8  PROT 8.9*  ALBUMIN 3.7    Basename 02/01/12 1145  LIPASE 13  AMYLASE --   CBC:  Basename 02/01/12 1145  WBC 9.4  NEUTROABS 8.4*  HGB 14.5  HCT 43.3  MCV 89.6  PLT 204   Cardiac Enzymes:  Basename 02/01/12 1145  CKTOTAL --  CKMB --  CKMBINDEX --  TROPONINI <0.30   Fasting Lipid Panel: Lab Results  Component Value Date   CHOL 199 10/05/2011   HDL 71 10/05/2011   LDLCALC 409* 10/05/2011    TRIG 70 10/05/2011   CHOLHDL 2.8 10/05/2011     Thyroid Function Tests: Lab Results  Component Value Date   TSH 1.446 04/25/2011    Urinalysis:  Basename 02/01/12 1310  COLORURINE YELLOW  LABSPEC 1.012  PHURINE 8.0  GLUCOSEU NEGATIVE  HGBUR TRACE*  BILIRUBINUR NEGATIVE  KETONESUR NEGATIVE  PROTEINUR NEGATIVE  UROBILINOGEN 0.2  NITRITE NEGATIVE  LEUKOCYTESUR NEGATIVE   Imaging results:  US Abdomen Complete  02/01/2012  *RADIOLOGY REPORT*  Clinical Data:  Abdominal pain.  COMPLETE ABDOMINAL ULTRASOUND  Comparison:  None.  Findings:  Gallbladder:  A few small mobile gallstones noted.  No wall thickening.  Negative sonographic  Murphy's.  Common bile duct:   Mildly dilated, 9 mm.  Cannot visualize the distal common bile duct to exclude distal stone.  Liver:  Suspect slight intrahepatic biliary ductal dilatation.  No focal abnormality.  Normal echotexture.  IVC:  Appears normal.  Pancreas:  No focal abnormality seen.  Spleen:  Within normal limits in size and echotexture.  Right Kidney:   Normal in size and parenchymal echogenicity.  No evidence of mass or hydronephrosis.  Left Kidney:  Normal in size and parenchymal echogenicity.  No evidence of mass or hydronephrosis.  Abdominal aorta:  No aneurysm identified.  IMPRESSION: A few small gravel-like stones in the gallbladder.  No changes of acute cholecystitis.  Mild intrahepatic and extrahepatic biliary ductal dilatation.  The distal common bile duct cannot be visualized due to overlying bowel gas.  I cannot exclude a distal obstructing CBD stone.  Recommend correlation with LFTs.  If further evaluation is felt warranted, MRCP may be beneficial.  Original Report Authenticated By: Cyndie Chime, M.D.    Other results: EKG: Regular rhythm, sinus rhythm, normal axis, normal R wave progression, and a ventricular premature complex, no ST or T wave changes indicating ischemia.  Assessment & Plan by Problem:  1. Abdominal pain with nausea and  vomiting likely in the setting of acute cholelithiasis considering abdominal ultrasound which is notable for gallstones with mild intrahepatic and  biliary ductal dilation as well as AST and ALT elevation. Other differential diagnoses include, but less likely, small bowel obstruction, pancreatitis (lipase was within normal limits).   Surgery was consulted who recommended to monitor patient's liver function. If improving, recommend cholecystectomy. If liver enzymes are increasing, the next step would be the ERCP per GI.   - Will admit to regular floor  - Placed n.p.o.  - Normal saline at 100 cc /hr - Dilaudid 1 mg every 4 hours for pain control  - Zofran for nausea  - Appreciate surgery assistant  - Repeat LFTs in the morning  - Depending on the liver function test will consider to consult to GI for further evaluation and management   2. Hypertension: Blood pressure initially elevated likely in the setting of pain. Repeat blood pressure was 105/57. Holding home medications as of now.   - Will continue to monitor   3. Hyperlipidemia ; LDL 114 in 10/2011. Patient has no history of diabetes.  - Will obtain hemoglobin A1c since patient has risk factors including hypertension, hyperlipidemia and obesity   4. Tachycardia likely in the setting of pain. TSH normal in September of 2012. EKG was within normal limits except a few PVCs  - We'll continue to monitor her  - Control pain  - IV fluids   5.Asthma ; stable will continue Advair and albuterol when necessary .   6. DVT ; Lovenox   Signed: Tyshika Baldridge 02/01/2012, 8:21 PM

## 2012-02-01 NOTE — ED Notes (Signed)
Pt c/o of upper abdominal pain that started early this morning around 4am. Nausea/vomiting x3. Denies taking anything to relieve pain. Pain 10/10.

## 2012-02-02 ENCOUNTER — Encounter (HOSPITAL_COMMUNITY)
Admission: EM | Disposition: A | Discharge status: Home / Self Care | Payer: Self-pay | Source: Home / Self Care | Attending: General Surgery

## 2012-02-02 ENCOUNTER — Inpatient Hospital Stay (HOSPITAL_COMMUNITY): Payer: Medicare Other

## 2012-02-02 ENCOUNTER — Other Ambulatory Visit: Payer: Self-pay

## 2012-02-02 ENCOUNTER — Inpatient Hospital Stay (HOSPITAL_COMMUNITY): Payer: Medicare Other | Admitting: Anesthesiology

## 2012-02-02 ENCOUNTER — Encounter (HOSPITAL_COMMUNITY): Payer: Self-pay | Admitting: Anesthesiology

## 2012-02-02 DIAGNOSIS — E669 Obesity, unspecified: Secondary | ICD-10-CM

## 2012-02-02 DIAGNOSIS — E785 Hyperlipidemia, unspecified: Secondary | ICD-10-CM

## 2012-02-02 DIAGNOSIS — K8021 Calculus of gallbladder without cholecystitis with obstruction: Secondary | ICD-10-CM

## 2012-02-02 DIAGNOSIS — J9601 Acute respiratory failure with hypoxia: Secondary | ICD-10-CM | POA: Diagnosis not present

## 2012-02-02 DIAGNOSIS — R112 Nausea with vomiting, unspecified: Secondary | ICD-10-CM

## 2012-02-02 DIAGNOSIS — J9600 Acute respiratory failure, unspecified whether with hypoxia or hypercapnia: Secondary | ICD-10-CM

## 2012-02-02 DIAGNOSIS — R109 Unspecified abdominal pain: Secondary | ICD-10-CM

## 2012-02-02 DIAGNOSIS — J9589 Other postprocedural complications and disorders of respiratory system, not elsewhere classified: Secondary | ICD-10-CM | POA: Diagnosis not present

## 2012-02-02 DIAGNOSIS — I10 Essential (primary) hypertension: Secondary | ICD-10-CM

## 2012-02-02 HISTORY — PX: CHOLECYSTECTOMY: SHX55

## 2012-02-02 LAB — URINALYSIS, ROUTINE W REFLEX MICROSCOPIC
Glucose, UA: NEGATIVE mg/dL
Ketones, ur: NEGATIVE mg/dL
Protein, ur: NEGATIVE mg/dL

## 2012-02-02 LAB — CBC
HCT: 35.5 % — ABNORMAL LOW (ref 36.0–46.0)
Hemoglobin: 11.7 g/dL — ABNORMAL LOW (ref 12.0–15.0)
MCH: 29.6 pg (ref 26.0–34.0)
MCV: 91 fL (ref 78.0–100.0)
Platelets: 182 10*3/uL (ref 150–400)
RBC: 3.93 MIL/uL (ref 3.87–5.11)
RDW: 14.8 % (ref 11.5–15.5)
WBC: 9.8 10*3/uL (ref 4.0–10.5)
WBC: 9.7 10*3/uL (ref 4.0–10.5)

## 2012-02-02 LAB — COMPREHENSIVE METABOLIC PANEL
ALT: 142 U/L — ABNORMAL HIGH (ref 0–35)
ALT: 151 U/L — ABNORMAL HIGH (ref 0–35)
AST: 138 U/L — ABNORMAL HIGH (ref 0–37)
Alkaline Phosphatase: 128 U/L — ABNORMAL HIGH (ref 39–117)
BUN: 15 mg/dL (ref 6–23)
CO2: 27 mEq/L (ref 19–32)
CO2: 25 mEq/L (ref 19–32)
Calcium: 8.7 mg/dL (ref 8.4–10.5)
GFR calc Af Amer: 58 mL/min — ABNORMAL LOW (ref >90)
GFR calc Af Amer: 58 mL/min — ABNORMAL LOW (ref >90)
GFR calc non Af Amer: 50 mL/min — ABNORMAL LOW (ref >90)
GFR calc non Af Amer: 50 mL/min — ABNORMAL LOW (ref >90)
Glucose, Bld: 86 mg/dL (ref 70–99)
Glucose, Bld: 103 mg/dL — ABNORMAL HIGH (ref 70–99)
Potassium: 3.9 mEq/L (ref 3.5–5.1)
Sodium: 136 mEq/L (ref 135–145)
Sodium: 135 mEq/L (ref 135–145)

## 2012-02-02 LAB — POCT I-STAT 3, ART BLOOD GAS (G3+)
Acid-base deficit: 1 mmol/L (ref 0.0–2.0)
pCO2 arterial: 50.5 mmHg — ABNORMAL HIGH (ref 35.0–45.0)
pH, Arterial: 7.319 — ABNORMAL LOW (ref 7.350–7.400)
pO2, Arterial: 67 mmHg — ABNORMAL LOW (ref 80.0–100.0)

## 2012-02-02 LAB — LACTIC ACID, PLASMA: Lactic Acid, Venous: 1.5 mmol/L (ref 0.5–2.2)

## 2012-02-02 LAB — HIV ANTIBODY (ROUTINE TESTING W REFLEX): HIV: NONREACTIVE

## 2012-02-02 LAB — CARDIAC PANEL(CRET KIN+CKTOT+MB+TROPI): Total CK: 317 U/L — ABNORMAL HIGH (ref 7–177)

## 2012-02-02 LAB — HEMOGLOBIN A1C
Hgb A1c MFr Bld: 5.5 % (ref <5.7)
Mean Plasma Glucose: 111 mg/dL (ref <117)

## 2012-02-02 LAB — URINE MICROSCOPIC-ADD ON

## 2012-02-02 SURGERY — LAPAROSCOPIC CHOLECYSTECTOMY
Anesthesia: General | Site: Abdomen | Wound class: Clean Contaminated

## 2012-02-02 MED ORDER — NEOSTIGMINE METHYLSULFATE 1 MG/ML IJ SOLN
INTRAMUSCULAR | Status: DC | PRN
  Administered 2012-02-02: 3 mg via INTRAVENOUS

## 2012-02-02 MED ORDER — PROPOFOL 10 MG/ML IV EMUL
5.0000 ug/kg/min | INTRAVENOUS | Status: DC
  Administered 2012-02-02: 5 ug/kg/min via INTRAVENOUS
  Filled 2012-02-02: qty 100

## 2012-02-02 MED ORDER — CEFAZOLIN SODIUM 1-5 GM-% IV SOLN
INTRAVENOUS | Status: DC | PRN
  Administered 2012-02-02: 2 g via INTRAVENOUS

## 2012-02-02 MED ORDER — ONDANSETRON HCL 4 MG/2ML IJ SOLN: 4.0000 mg | Freq: Four times a day (QID) | INTRAMUSCULAR | Status: DC | PRN

## 2012-02-02 MED ORDER — CEFAZOLIN SODIUM-DEXTROSE 2-3 GM-% IV SOLR
2.0000 g | INTRAVENOUS | Status: DC
  Filled 2012-02-02: qty 50

## 2012-02-02 MED ORDER — SUCCINYLCHOLINE CHLORIDE 20 MG/ML IJ SOLN
INTRAMUSCULAR | Status: DC | PRN
  Administered 2012-02-02 (×2): 100 mg via INTRAVENOUS

## 2012-02-02 MED ORDER — CHLORHEXIDINE GLUCONATE 4 % EX LIQD: 1.0000 "application " | Freq: Once | CUTANEOUS | Status: DC

## 2012-02-02 MED ORDER — SODIUM CHLORIDE 0.9 % IV SOLN
INTRAVENOUS | Status: DC | PRN
  Administered 2012-02-02: 13:00:00

## 2012-02-02 MED ORDER — CEFAZOLIN SODIUM 1-5 GM-% IV SOLN
INTRAVENOUS | Status: AC
  Filled 2012-02-02: qty 100

## 2012-02-02 MED ORDER — CHLORHEXIDINE GLUCONATE 4 % EX LIQD
1.0000 "application " | Freq: Once | CUTANEOUS | Status: DC
  Filled 2012-02-02: qty 15

## 2012-02-02 MED ORDER — SODIUM CHLORIDE 0.9 % IR SOLN
Status: DC | PRN
  Administered 2012-02-02: 1000 mL

## 2012-02-02 MED ORDER — PANTOPRAZOLE SODIUM 40 MG IV SOLR
40.0000 mg | Freq: Every day | INTRAVENOUS | Status: DC
  Administered 2012-02-02 – 2012-02-03 (×2): 40 mg via INTRAVENOUS
  Filled 2012-02-02 (×5): qty 40

## 2012-02-02 MED ORDER — IPRATROPIUM-ALBUTEROL 18-103 MCG/ACT IN AERO
INHALATION_SPRAY | RESPIRATORY_TRACT | Status: DC | PRN
  Administered 2012-02-02: 4 via RESPIRATORY_TRACT

## 2012-02-02 MED ORDER — FENTANYL CITRATE 0.05 MG/ML IJ SOLN
INTRAMUSCULAR | Status: DC | PRN
  Administered 2012-02-02: 50 ug via INTRAVENOUS
  Administered 2012-02-02: 100 ug via INTRAVENOUS
  Administered 2012-02-02: 50 ug via INTRAVENOUS

## 2012-02-02 MED ORDER — LIDOCAINE HCL (CARDIAC) 20 MG/ML IV SOLN
INTRAVENOUS | Status: DC | PRN
  Administered 2012-02-02: 80 mg via INTRAVENOUS

## 2012-02-02 MED ORDER — IPRATROPIUM BROMIDE 0.02 % IN SOLN
0.5000 mg | Freq: Four times a day (QID) | RESPIRATORY_TRACT | Status: DC
  Administered 2012-02-02 – 2012-02-03 (×3): 0.5 mg via RESPIRATORY_TRACT
  Filled 2012-02-02 (×3): qty 2.5

## 2012-02-02 MED ORDER — HYDROMORPHONE HCL PF 1 MG/ML IJ SOLN
1.0000 mg | INTRAMUSCULAR | Status: DC | PRN
  Administered 2012-02-02 (×2): 1 mg via INTRAVENOUS
  Filled 2012-02-02 (×2): qty 1

## 2012-02-02 MED ORDER — HYDROMORPHONE HCL PF 1 MG/ML IJ SOLN
0.2500 mg | INTRAMUSCULAR | Status: DC | PRN
  Administered 2012-02-02 (×2): 0.5 mg via INTRAVENOUS

## 2012-02-02 MED ORDER — OXYCODONE HCL 5 MG PO TABS
5.0000 mg | ORAL_TABLET | ORAL | Status: DC | PRN
  Administered 2012-02-03: 10 mg via ORAL
  Administered 2012-02-03: 5 mg via ORAL
  Administered 2012-02-04: 10 mg via ORAL
  Filled 2012-02-02 (×2): qty 2
  Filled 2012-02-02: qty 1

## 2012-02-02 MED ORDER — PROPOFOL 10 MG/ML IV EMUL
5.0000 ug/kg/min | INTRAVENOUS | Status: DC
Start: 2012-02-02 — End: 2012-02-03
  Filled 2012-02-02: qty 100

## 2012-02-02 MED ORDER — BUDESONIDE 0.25 MG/2ML IN SUSP
0.2500 mg | Freq: Four times a day (QID) | RESPIRATORY_TRACT | Status: DC
  Administered 2012-02-02 – 2012-02-03 (×3): 0.25 mg via RESPIRATORY_TRACT
  Filled 2012-02-02 (×9): qty 2

## 2012-02-02 MED ORDER — ONDANSETRON HCL 4 MG/2ML IJ SOLN
INTRAMUSCULAR | Status: DC | PRN
  Administered 2012-02-02: 4 mg via INTRAVENOUS

## 2012-02-02 MED ORDER — GLYCOPYRROLATE 0.2 MG/ML IJ SOLN
INTRAMUSCULAR | Status: DC | PRN
  Administered 2012-02-02: 0.4 mg via INTRAVENOUS

## 2012-02-02 MED ORDER — PROPOFOL 10 MG/ML IV EMUL
INTRAVENOUS | Status: DC | PRN
  Administered 2012-02-02: 160 mg via INTRAVENOUS
  Administered 2012-02-02: 200 mg via INTRAVENOUS

## 2012-02-02 MED ORDER — ALBUTEROL SULFATE (5 MG/ML) 0.5% IN NEBU
2.5000 mg | INHALATION_SOLUTION | RESPIRATORY_TRACT | Status: DC | PRN
  Administered 2012-02-04: 2.5 mg via RESPIRATORY_TRACT
  Filled 2012-02-02 (×2): qty 0.5

## 2012-02-02 MED ORDER — ALBUTEROL SULFATE (5 MG/ML) 0.5% IN NEBU
2.5000 mg | INHALATION_SOLUTION | Freq: Four times a day (QID) | RESPIRATORY_TRACT | Status: DC
  Administered 2012-02-02 – 2012-02-03 (×3): 2.5 mg via RESPIRATORY_TRACT
  Filled 2012-02-02 (×7): qty 0.5

## 2012-02-02 MED ORDER — LACTATED RINGERS IV SOLN
INTRAVENOUS | Status: DC | PRN
  Administered 2012-02-02 (×2): via INTRAVENOUS

## 2012-02-02 MED ORDER — FENTANYL CITRATE 0.05 MG/ML IJ SOLN
50.0000 ug | INTRAMUSCULAR | Status: DC | PRN
  Administered 2012-02-02 (×2): 100 ug via INTRAVENOUS
  Administered 2012-02-03 (×3): 50 ug via INTRAVENOUS
  Filled 2012-02-02 (×4): qty 2

## 2012-02-02 MED ORDER — PROPOFOL 10 MG/ML IV EMUL
5.0000 ug/kg/min | INTRAVENOUS | Status: DC
  Administered 2012-02-02: 40 ug/kg/min via INTRAVENOUS
  Administered 2012-02-02: 50 ug/kg/min via INTRAVENOUS
  Administered 2012-02-02 – 2012-02-03 (×3): 40 ug/kg/min via INTRAVENOUS
  Filled 2012-02-02 (×5): qty 100

## 2012-02-02 MED ORDER — BUPIVACAINE-EPINEPHRINE PF 0.25-1:200000 % IJ SOLN
INTRAMUSCULAR | Status: AC
Start: 2012-02-02 — End: 2012-02-02
  Filled 2012-02-02: qty 30

## 2012-02-02 MED ORDER — ROCURONIUM BROMIDE 100 MG/10ML IV SOLN
INTRAVENOUS | Status: DC | PRN
  Administered 2012-02-02 (×2): 25 mg via INTRAVENOUS

## 2012-02-02 MED ORDER — BUPIVACAINE-EPINEPHRINE 0.25% -1:200000 IJ SOLN
INTRAMUSCULAR | Status: DC | PRN
  Administered 2012-02-02: 18 mL

## 2012-02-02 MED ORDER — BUDESONIDE 0.25 MG/2ML IN SUSP
0.2500 mg | Freq: Four times a day (QID) | RESPIRATORY_TRACT | Status: DC
  Filled 2012-02-02 (×3): qty 2

## 2012-02-02 SURGICAL SUPPLY — 44 items
ADH SKN CLS LQ APL DERMABOND (GAUZE/BANDAGES/DRESSINGS) ×1
APPLIER CLIP 5 13 M/L LIGAMAX5 (MISCELLANEOUS) ×3
APPLIER CLIP ROT 10 11.4 M/L (STAPLE)
BLADE SURG ROTATE 9660 (MISCELLANEOUS) IMPLANT
CANISTER SUCTION 2500CC (MISCELLANEOUS) ×3 IMPLANT
CHLORAPREP W/TINT 26ML (MISCELLANEOUS) ×3 IMPLANT
CLIP APPLIE 5 13 M/L LIGAMAX5 (MISCELLANEOUS) ×2 IMPLANT
CLIP APPLIE ROT 10 11.4 M/L (STAPLE) IMPLANT
CLOTH BEACON ORANGE TIMEOUT ST (SAFETY) ×3 IMPLANT
COVER MAYO STAND STRL (DRAPES) ×3 IMPLANT
COVER SURGICAL LIGHT HANDLE (MISCELLANEOUS) ×3 IMPLANT
DECANTER SPIKE VIAL GLASS SM (MISCELLANEOUS) ×6 IMPLANT
DERMABOND ADHESIVE PROPEN (GAUZE/BANDAGES/DRESSINGS) ×1
DERMABOND ADVANCED (GAUZE/BANDAGES/DRESSINGS) ×1
DERMABOND ADVANCED .7 DNX12 (GAUZE/BANDAGES/DRESSINGS) ×2 IMPLANT
DERMABOND ADVANCED .7 DNX6 (GAUZE/BANDAGES/DRESSINGS) ×2 IMPLANT
DRAPE C-ARM 42X72 X-RAY (DRAPES) ×3 IMPLANT
DRAPE UTILITY 15X26 W/TAPE STR (DRAPE) ×6 IMPLANT
ELECT REM PT RETURN 9FT ADLT (ELECTROSURGICAL) ×3
ELECTRODE REM PT RTRN 9FT ADLT (ELECTROSURGICAL) ×2 IMPLANT
FILTER SMOKE EVAC LAPAROSHD (FILTER) IMPLANT
GLOVE BIO SURGEON STRL SZ8 (GLOVE) ×3 IMPLANT
GLOVE BIOGEL PI IND STRL 8 (GLOVE) ×2 IMPLANT
GLOVE BIOGEL PI INDICATOR 8 (GLOVE) ×1
GOWN PREVENTION PLUS XLARGE (GOWN DISPOSABLE) ×3 IMPLANT
GOWN STRL NON-REIN LRG LVL3 (GOWN DISPOSABLE) ×9 IMPLANT
KIT BASIN OR (CUSTOM PROCEDURE TRAY) ×3 IMPLANT
KIT ROOM TURNOVER OR (KITS) ×3 IMPLANT
NS IRRIG 1000ML POUR BTL (IV SOLUTION) ×3 IMPLANT
PAD ARMBOARD 7.5X6 YLW CONV (MISCELLANEOUS) ×6 IMPLANT
POUCH SPECIMEN RETRIEVAL 10MM (ENDOMECHANICALS) ×3 IMPLANT
SCISSORS LAP 5X35 DISP (ENDOMECHANICALS) IMPLANT
SET CHOLANGIOGRAPH 5 50 .035 (SET/KITS/TRAYS/PACK) ×3 IMPLANT
SET IRRIG TUBING LAPAROSCOPIC (IRRIGATION / IRRIGATOR) ×3 IMPLANT
SLEEVE ADV FIXATION 5X100MM (TROCAR) ×6 IMPLANT
SPECIMEN JAR SMALL (MISCELLANEOUS) ×3 IMPLANT
SUT VIC AB 4-0 PS2 27 (SUTURE) ×3 IMPLANT
TOWEL OR 17X24 6PK STRL BLUE (TOWEL DISPOSABLE) ×3 IMPLANT
TOWEL OR 17X26 10 PK STRL BLUE (TOWEL DISPOSABLE) ×3 IMPLANT
TRAY LAPAROSCOPIC (CUSTOM PROCEDURE TRAY) ×3 IMPLANT
TROCAR HASSON GELL 12X100 (TROCAR) ×3 IMPLANT
TROCAR Z-THREAD FIOS 11X100 BL (TROCAR) IMPLANT
TROCAR Z-THREAD FIOS 5X100MM (TROCAR) ×3 IMPLANT
WATER STERILE IRR 1000ML POUR (IV SOLUTION) IMPLANT

## 2012-02-02 NOTE — Progress Notes (Signed)
Last dose of ASA 81 mg. Was Thursday, 01/31/12.

## 2012-02-02 NOTE — Interval H&P Note (Signed)
History and Physical Interval Note:  02/02/2012 11:49 AM  Erika [name]  has presented today for surgery, with the diagnosis of cholecystitis  The various methods of treatment have been discussed with the patient and family. After consideration of risks, benefits and other options for treatment, the patient has consented to  Procedure(s) (LRB): LAPAROSCOPIC CHOLECYSTECTOMY WITH INTRAOPERATIVE CHOLANGIOGRAM (N/A) as a surgical intervention .  The patient's history has been reviewed, patient examined, no change in status, stable for surgery.  I have reviewed the patients' chart and labs.  Questions were answered to the patient's satisfaction.   Please also see my progress note from today  [facility]

## 2012-02-02 NOTE — Transfer of Care (Signed)
Immediate Anesthesia Transfer of Care Note  Patient: Erika [name]  Procedure(s) Performed: Procedure(s) (LRB): LAPAROSCOPIC CHOLECYSTECTOMY (N/A)  Patient Location: PACU  Anesthesia Type: General  Level of Consciousness: Patient remains intubated per anesthesia plan  Airway & Oxygen Therapy: Patient remains intubated per anesthesia plan and Patient placed on Ventilator (see vital sign flow sheet for setting)  Post-op Assessment: Report given to PACU RN and Post -op Vital signs reviewed and stable  Post vital signs: Reviewed and stable  Complications: Patient re-intubated

## 2012-02-02 NOTE — Progress Notes (Signed)
Erika [name] [id number] June 14, 1941  CARE TEAM:  PCP: Nelda Bucks, MD  Outpatient Care Team: Patient Care Team: Verdene Rio, MD as PCP - General (Internal Medicine)  Inpatient Treatment Team: Treatment Team: Attending Provider: Farley Ly, MD; Registered Nurse: Magda Paganini, RN; Rounding Team: Imts - Maurice March (Rounding), MD; Consulting Physician: Bishop Limbo, MD; Technician: Lowell Bouton, NT; Registered Nurse: Laureen Ochs, RN; Respiratory Therapist: Toula Moos, RRT; Technician: Star Age, NT  Subjective:  Pain down Feeling better  Objective:  Vital signs:  Filed Vitals:   02/01/12 2231 02/02/12 0210 02/02/12 0554 02/02/12 0828  BP: 148/77 144/83 123/54   Pulse: 86 102 100   Temp: 98.2 F (36.8 C) 99.3 F (37.4 C) 99.2 F (37.3 C)   TempSrc: Oral Oral Oral   Resp: 16 20 18    Height:      Weight:      SpO2: 90% 97% 90% 92%    Last BM Date: 02/01/12  Intake/Output   Yesterday:  06/28 0701 - 06/29 0700 In: 1048 [I.V.:1048] Out: 200 [Urine:200] This shift:     Bowel function:  Flatus: y  BM: n  Physical Exam:  General: Pt awake/alert/oriented x4 in no acute distress Eyes: PERRL, normal EOM.  Sclera clear.  No icterus Neuro: CN II-XII intact w/o focal sensory/motor deficits. Lymph: No head/neck/groin lymphadenopathy Psych:  No delerium/psychosis/paranoia HENT: Normocephalic, Mucus membranes moist.  No thrush Neck: Supple, No tracheal deviation Chest: No chest wall pain w good excursion CV:  Pulses intact.  Regular rhythm Abdomen: Soft.  Nondistended.  Mildly tender at epigastric area only.  No incarcerated hernias. Ext:  SCDs BLE.  No mjr edema.  No cyanosis Skin: No petechiae / purpurae  Results:   Labs: Results for orders placed during the hospital encounter of 02/01/12 (from the past 48 hour(s))  CBC WITH DIFFERENTIAL     Status: Abnormal   Collection Time   02/01/12 11:45 AM      Component Value Range  Comment   WBC 9.4  4.0 - 10.5 K/uL    RBC 4.83  3.87 - 5.11 MIL/uL    Hemoglobin 14.5  12.0 - 15.0 g/dL    HCT 11.9  14.7 - 82.9 %    MCV 89.6  78.0 - 100.0 fL    MCH 30.0  26.0 - 34.0 pg    MCHC 33.5  30.0 - 36.0 g/dL    RDW 56.2  13.0 - 86.5 %    Platelets 204  150 - 400 K/uL    Neutrophils Relative 89 (*) 43 - 77 %    Neutro Abs 8.4 (*) 1.7 - 7.7 K/uL    Lymphocytes Relative 6 (*) 12 - 46 %    Lymphs Abs 0.6 (*) 0.7 - 4.0 K/uL    Monocytes Relative 5  3 - 12 %    Monocytes Absolute 0.4  0.1 - 1.0 K/uL    Eosinophils Relative 0  0 - 5 %    Eosinophils Absolute 0.0  0.0 - 0.7 K/uL    Basophils Relative 0  0 - 1 %    Basophils Absolute 0.0  0.0 - 0.1 K/uL   COMPREHENSIVE METABOLIC PANEL     Status: Abnormal   Collection Time   02/01/12 11:45 AM      Component Value Range Comment   Sodium 131 (*) 135 - 145 mEq/L    Potassium 3.5  3.5 - 5.1 mEq/L    Chloride 95 (*)  96 - 112 mEq/L    CO2 23  19 - 32 mEq/L    Glucose, Bld 119 (*) 70 - 99 mg/dL    BUN 10  6 - 23 mg/dL    Creatinine, Ser 2.95  0.50 - 1.10 mg/dL    Calcium 9.5  8.4 - 62.1 mg/dL    Total Protein 8.9 (*) 6.0 - 8.3 g/dL    Albumin 3.7  3.5 - 5.2 g/dL    AST 308 (*) 0 - 37 U/L    ALT 83 (*) 0 - 35 U/L    Alkaline Phosphatase 160 (*) 39 - 117 U/L    Total Bilirubin 0.8  0.3 - 1.2 mg/dL    GFR calc non Af Amer 85 (*) >90 mL/min    GFR calc Af Amer >90  >90 mL/min   LIPASE, BLOOD     Status: Normal   Collection Time   02/01/12 11:45 AM      Component Value Range Comment   Lipase 13  11 - 59 U/L   TROPONIN I     Status: Normal   Collection Time   02/01/12 11:45 AM      Component Value Range Comment   Troponin I <0.30  <0.30 ng/mL   URINALYSIS, ROUTINE W REFLEX MICROSCOPIC     Status: Abnormal   Collection Time   02/01/12  1:10 PM      Component Value Range Comment   Color, Urine YELLOW  YELLOW    APPearance CLEAR  CLEAR    Specific Gravity, Urine 1.012  1.005 - 1.030    pH 8.0  5.0 - 8.0    Glucose, UA NEGATIVE   NEGATIVE mg/dL    Hgb urine dipstick TRACE (*) NEGATIVE    Bilirubin Urine NEGATIVE  NEGATIVE    Ketones, ur NEGATIVE  NEGATIVE mg/dL    Protein, ur NEGATIVE  NEGATIVE mg/dL    Urobilinogen, UA 0.2  0.0 - 1.0 mg/dL    Nitrite NEGATIVE  NEGATIVE    Leukocytes, UA NEGATIVE  NEGATIVE   URINE MICROSCOPIC-ADD ON     Status: Abnormal   Collection Time   02/01/12  1:10 PM      Component Value Range Comment   Squamous Epithelial / LPF FEW (*) RARE    WBC, UA 0-2  <3 WBC/hpf    RBC / HPF 7-10  <3 RBC/hpf    Bacteria, UA FEW (*) RARE   PROTIME-INR     Status: Normal   Collection Time   02/01/12  8:14 PM      Component Value Range Comment   Prothrombin Time 13.9  11.6 - 15.2 seconds    INR 1.05  0.00 - 1.49   HEMOGLOBIN A1C     Status: Normal   Collection Time   02/01/12  8:14 PM      Component Value Range Comment   Hemoglobin A1C 5.5  <5.7 %    Mean Plasma Glucose 111  <117 mg/dL   CARDIAC PANEL(CRET KIN+CKTOT+MB+TROPI)     Status: Abnormal   Collection Time   02/01/12  8:14 PM      Component Value Range Comment   Total CK 228 (*) 7 - 177 U/L    CK, MB 3.7  0.3 - 4.0 ng/mL    Troponin I <0.30  <0.30 ng/mL    Relative Index 1.6  0.0 - 2.5   HIV ANTIBODY (ROUTINE TESTING)     Status: Normal   Collection Time  02/01/12  8:14 PM      Component Value Range Comment   HIV NON REACTIVE  NON REACTIVE   CBC     Status: Abnormal   Collection Time   02/02/12  6:21 AM      Component Value Range Comment   WBC 9.8  4.0 - 10.5 K/uL    RBC 3.93  3.87 - 5.11 MIL/uL    Hemoglobin 11.7 (*) 12.0 - 15.0 g/dL    HCT 14.7 (*) 82.9 - 46.0 %    MCV 90.3  78.0 - 100.0 fL    MCH 29.8  26.0 - 34.0 pg    MCHC 33.0  30.0 - 36.0 g/dL    RDW 56.2  13.0 - 86.5 %    Platelets 190  150 - 400 K/uL   COMPREHENSIVE METABOLIC PANEL     Status: Abnormal   Collection Time   02/02/12  6:21 AM      Component Value Range Comment   Sodium 136  135 - 145 mEq/L    Potassium 3.9  3.5 - 5.1 mEq/L    Chloride 100  96 -  112 mEq/L    CO2 27  19 - 32 mEq/L    Glucose, Bld 86  70 - 99 mg/dL    BUN 14  6 - 23 mg/dL    Creatinine, Ser 7.84  0.50 - 1.10 mg/dL    Calcium 8.7  8.4 - 69.6 mg/dL    Total Protein 7.1  6.0 - 8.3 g/dL    Albumin 2.9 (*) 3.5 - 5.2 g/dL    AST 295 (*) 0 - 37 U/L    ALT 142 (*) 0 - 35 U/L    Alkaline Phosphatase 128 (*) 39 - 117 U/L    Total Bilirubin 0.7  0.3 - 1.2 mg/dL    GFR calc non Af Amer 50 (*) >90 mL/min    GFR calc Af Amer 58 (*) >90 mL/min   URINALYSIS, ROUTINE W REFLEX MICROSCOPIC     Status: Abnormal   Collection Time   02/02/12  6:43 AM      Component Value Range Comment   Color, Urine ORANGE (*) YELLOW BIOCHEMICALS MAY BE AFFECTED BY COLOR   APPearance CLOUDY (*) CLEAR    Specific Gravity, Urine 1.025  1.005 - 1.030    pH 5.5  5.0 - 8.0    Glucose, UA NEGATIVE  NEGATIVE mg/dL    Hgb urine dipstick SMALL (*) NEGATIVE    Bilirubin Urine SMALL (*) NEGATIVE    Ketones, ur NEGATIVE  NEGATIVE mg/dL    Protein, ur NEGATIVE  NEGATIVE mg/dL    Urobilinogen, UA 1.0  0.0 - 1.0 mg/dL    Nitrite NEGATIVE  NEGATIVE    Leukocytes, UA TRACE (*) NEGATIVE   URINE MICROSCOPIC-ADD ON     Status: Abnormal   Collection Time   02/02/12  6:43 AM      Component Value Range Comment   Squamous Epithelial / LPF RARE  RARE    WBC, UA 0-2  <3 WBC/hpf    RBC / HPF 3-6  <3 RBC/hpf    Bacteria, UA RARE  RARE    Casts HYALINE CASTS (*) NEGATIVE     Imaging / Studies: US Abdomen Complete  02/01/2012  *RADIOLOGY REPORT*  Clinical Data:  Abdominal pain.  COMPLETE ABDOMINAL ULTRASOUND  Comparison:  None.  Findings:  Gallbladder:  A few small mobile gallstones noted.  No wall thickening.  Negative sonographic Murphy's.  Common bile duct:   Mildly dilated, 9 mm.  Cannot visualize the distal common bile duct to exclude distal stone.  Liver:  Suspect slight intrahepatic biliary ductal dilatation.  No focal abnormality.  Normal echotexture.  IVC:  Appears normal.  Pancreas:  No focal abnormality seen.   Spleen:  Within normal limits in size and echotexture.  Right Kidney:   Normal in size and parenchymal echogenicity.  No evidence of mass or hydronephrosis.  Left Kidney:  Normal in size and parenchymal echogenicity.  No evidence of mass or hydronephrosis.  Abdominal aorta:  No aneurysm identified.  IMPRESSION: A few small gravel-like stones in the gallbladder.  No changes of acute cholecystitis.  Mild intrahepatic and extrahepatic biliary ductal dilatation.  The distal common bile duct cannot be visualized due to overlying bowel gas.  I cannot exclude a distal obstructing CBD stone.  Recommend correlation with LFTs.  If further evaluation is felt warranted, MRCP may be beneficial.  Original Report Authenticated By: Cyndie Chime, M.D.    Medications / Allergies: per chart  Antibiotics: Anti-infectives    None      Problem List:  Principal Problem:  *Cholelithiasis with obstruction Active Problems:  HYPERLIPIDEMIA  HYPERTENSION  ASTHMA  TACHYCARDIA   Assessment  Quay Burow  71 y.o. female       Cholecystitis w improving LFTs  Plan:  Lap chole 1st.  Hold off on ERCP/GI eval.  D/w Dr. Janee Morn whom agrees & will operate later today  The anatomy & physiology of hepatobiliary & pancreatic function was discussed.  The pathophysiology of gallbladder dysfunction was discussed.  Natural history risks without surgery was discussed.   I feel the risks of no intervention will lead to serious problems that outweigh the operative risks; therefore, I recommended cholecystectomy to remove the pathology.  I explained laparoscopic techniques with possible need for an open approach.  Probable cholangiogram to evaluate the bilary tract was explained as well.    Risks such as bleeding, infection, abscess, leak, injury to other organs, need for further treatment, heart attack, death, and other risks were discussed.  I noted a good likelihood this will help address the problem.  Possibility that  this will not correct all abdominal symptoms was explained.  Goals of post-operative recovery were discussed as well.  We will work to minimize complications.  An educational handout further explaining the pathology and treatment options was given as well.  Questions were answered.  The patient expresses understanding & wishes to proceed with surgery.  -VTE prophylaxis- SCDs, etc -mobilize as tolerated to help recovery  Ardeth Sportsman, M.D., F.A.C.S. Gastrointestinal and Minimally Invasive Surgery [facility] [address], [address] [place], [place] [address] [phone] Main / Paging [phone] Voice Mail   02/02/2012

## 2012-02-02 NOTE — Consult Note (Signed)
Name: Erika [name] MRN: [id number] DOB: 1940-10-01    LOS: 1 DAt of admit 02/01/2012   Referring Provider:  CCS Reason for Referral:  VDRF p lap 6/29  PULMONARY / CRITICAL CARE MEDICINE  HPI:  71 yo AFrican American Female  Morbidly obese with some pasth medical hx only. Underwent laparoscopic cholecystectomy 6/29 and required reintubation post op in OR. PCCM asked to manage care in ICU setting by IM teaching service on 6./29/13  Past Medical History  Diagnosis Date  . Hypertension   . Tuberculosis     active TB treated in 2002, hx of paraspinal lumbar TB,   . Obesity     BMI 54  . Menopause   . Breast cyst     Excesion with FNA, begnin in 2004.   [name] Shingles   . Asthma   . SVT (supraventricular tachycardia) June 2009    one run while hospitalized  . Back pain     status post surgery 2002  . Stasis dermatitis     W/ LE edema, prviously on lasix now on mazxide.   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  . Lymphadenopathy     Of the mediastinum, Right side CXR 2008, not read on 2010 cxr.   [name] History of shingles     Recurrent with post herpetic neuralgia.   . Degenerative joint disease of spine     Imaging 2005,  Degenerative hypertrophic facet arthritis changes L4-5 and L5-S1..   . Anemia     with menses   Past Surgical History  Procedure Date  . Hemilaminectomy of l4, l5 and s1 decompression of tumor in epidural space 2000  . Breast cyst biopsy    Prior to Admission medications   Medication Sig Start Date End Date Taking? Authorizing Provider  albuterol (PROAIR HFA) 108 (90 BASE) MCG/ACT inhaler Inhale 2 puffs into the lungs every 6 (six) hours as needed for wheezing. 01/02/12 01/01/13 Yes Verdene Rio, MD  amLODipine (NORVASC) 10 MG tablet Take 1 tablet (10 mg total) by mouth daily. 10/05/11  Yes Verdene Rio, MD  aspirin EC 81 MG tablet Take 1 tablet (81 mg total) by mouth daily. 05/16/11 05/15/12 Yes Kathleene Hazel, MD  Fluticasone-Salmeterol (ADVAIR  DISKUS) 250-50 MCG/DOSE AEPB Inhale 1 puff into the lungs 2 (two) times daily. 04/25/11  Yes Verdene Rio, MD  omeprazole (PRILOSEC) 20 MG capsule Take 1 capsule (20 mg total) by mouth daily. 05/21/11  Yes Verdene Rio, MD  traMADol (ULTRAM) 50 MG tablet Take 1-2 tablets (50-100 mg total) by mouth every 6 (six) hours as needed for pain. 12/27/11 12/26/12 Yes Verdene Rio, MD   Allergies Allergies  Allergen Reactions  . Prednisone Other (See Comments)    REACTION: Psychosis, talking out of head, insomnia    Family History Family History  Problem Relation Age of Onset  . Stroke Mother     at young age  . Stroke Father     in 49's  . Stroke Brother   . Stroke Brother    Social History  reports that she has never smoked. She has never used smokeless tobacco. She reports that she does not drink alcohol or use illicit drugs.    Review Of Systems: na  Brief patient description:  MOAAF  Events Since Admission: 6/29 reitubation  Current Status: In 2300 ICU looks criticall ill,. Moving all 4s, coughing  Vital Signs: Temp:  [97.7 F (36.5 C)-99.3 F (37.4  C)] 98.7 F (37.1 C) (06/29 1550) Pulse Rate:  [83-108] 98  (06/29 1050) Resp:  [16-20] 18  (06/29 1050) BP: (105-177)/(54-91) 141/85 mmHg (06/29 1050) SpO2:  [90 %-100 %] 100 % (06/29 1440) FiO2 (%):  [40 %] 40 % (06/29 1613) Weight:  [151.048 kg (333 lb)] 151.048 kg (333 lb) (06/28 1636)  Physical Examination: General:  MOAAF Neuro:  Intbuated. Moving all 4s. Cough + HEENT:  No adenopathy Neck:  short Cardiovascular:  HSR 3/6 murmur Lungs:  Coarse crackles + Abdomen:  Incision  intact Musculoskeletal:  intact Skin:  warm  US Abdomen Complete  02/01/2012  *RADIOLOGY REPORT*  Clinical Data:  Abdominal pain.  COMPLETE ABDOMINAL ULTRASOUND  Comparison:  None.  Findings:  Gallbladder:  A few small mobile gallstones noted.  No wall thickening.  Negative sonographic Murphy's.  Common bile duct:   Mildly dilated, 9  mm.  Cannot visualize the distal common bile duct to exclude distal stone.  Liver:  Suspect slight intrahepatic biliary ductal dilatation.  No focal abnormality.  Normal echotexture.  IVC:  Appears normal.  Pancreas:  No focal abnormality seen.  Spleen:  Within normal limits in size and echotexture.  Right Kidney:   Normal in size and parenchymal echogenicity.  No evidence of mass or hydronephrosis.  Left Kidney:  Normal in size and parenchymal echogenicity.  No evidence of mass or hydronephrosis.  Abdominal aorta:  No aneurysm identified.  IMPRESSION: A few small gravel-like stones in the gallbladder.  No changes of acute cholecystitis.  Mild intrahepatic and extrahepatic biliary ductal dilatation.  The distal common bile duct cannot be visualized due to overlying bowel gas.  I cannot exclude a distal obstructing CBD stone.  Recommend correlation with LFTs.  If further evaluation is felt warranted, MRCP may be beneficial.  Original Report Authenticated By: Cyndie Chime, M.D.      Principal Problem:  *Acute respiratory failure with hypoxia Active Problems:  Acute postoperative respiratory insufficiency  HYPERLIPIDEMIA  OBESITY  HYPERTENSION  ASTHMA  TACHYCARDIA  Cholelithiasis with obstruction   ASSESSMENT AND PLAN  PULMONARY No results found for this basename: PHART:5,PCO2:5,PCO2ART:5,PO2ART:5,HCO3:5,O2SAT:5 in the last 168 hours Ventilator Settings: Vent Mode:  [-] PRVC FiO2 (%):  [40 %] 40 % Set Rate:  [15 bmp] 15 bmp Vt Set:  [500 mL-600 mL] 500 mL PEEP:  [5 cmH20] 5 cmH20 Plateau Pressure:  [25 cmH20-26 cmH20] 26 cmH20 CXR:    ETT:  6/29 x 2>>  A: VDRF with underlying MO,asthma P:   See vent orders Overnight on vent and extubate am 6/30. May need nimvs Bd's Check abg Diprivan for sedation Check cxr   CARDIOVASCULAR  Lab 02/01/12 2014 02/01/12 1145  TROPONINI <0.30 <0.30  LATICACIDVEN -- --  PROBNP -- --   ECG:  nsr Lines:   A: HTN  P:  -hold meds for  now  RENAL  Lab 02/02/12 0621 02/01/12 1145  NA 136 131*  K 3.9 3.5  CL 100 95*  CO2 27 23  BUN 14 10  CREATININE 1.10 0.74  CALCIUM 8.7 9.5  MG -- --  PHOS -- --   Intake/Output      06/28 0701 - 06/29 0700 06/29 0701 - 06/30 0700   I.V. (mL/kg) 1048 (6.9) 1300 (8.6)   Total Intake(mL/kg) 1048 (6.9) 1300 (8.6)   Urine (mL/kg/hr) 200 (0.1)    Total Output 200    Net +848 +1300        Urine Occurrence 1 x  Foley:  6/29  A:  No acute issue now P:   Recheck lytes post op   GASTROINTESTINAL  Lab 02/02/12 0621 02/01/12 1145  AST 138* 183*  ALT 142* 83*  ALKPHOS 128* 160*  BILITOT 0.7 0.8  PROT 7.1 8.9*  ALBUMIN 2.9* 3.7    A: p lap with increased lft P:   Monitor lft  HEMATOLOGIC  Lab 02/02/12 0621 02/01/12 2014 02/01/12 1145  HGB 11.7* -- 14.5  HCT 35.5* -- 43.3  PLT 190 -- 204  INR -- 1.05 --  APTT -- -- --   A:   No acute issue now P:    INFECTIOUS  Lab 02/02/12 0621 02/01/12 1145  WBC 9.8 9.4  PROCALCITON -- --   Cultures:  Antibiotics:   A:   No acute issue now P:   Check PCT  ENDOCRINE No results found for this basename: GLUCAP:5 in the last 168 hours A:  No acute issue now P:     NEUROLOGIC  A:   No acute issue now P:   Prn sedation with fentanyl RASS goal 0  BEST PRACTICE / DISPOSITION  Level of Care:  icu Primary Service:  TSPT -> PCCM while in ICU from 02/02/12 Consultants:  PCCM/CCS Code Status:  full Diet:  npo DVT Px:  scd GI Px:  ppi Skin Integrity:  abd incision intact Social / Family:  None at bedside  The patient is critically ill with multiple organ systems failure and requires high complexity decision making for assessment and support, frequent evaluation and titration of therapies, application of advanced monitoring technologies and extensive interpretation of multiple databases.   Critical Care Time devoted to patient care services described in this note is  45  Minutes.  Dr. Kalman Shan,  M.D., [facility].C.P Pulmonary and Critical Care Medicine Staff Physician [facility] Pager: [phone], If no answer or between  15:00h - 7:00h: call [phone]  02/02/2012 4:31 PM      .

## 2012-02-02 NOTE — Anesthesia Procedure Notes (Signed)
Procedure Name: Intubation Date/Time: 02/02/2012 12:17 PM Performed by: Arlice Colt B Pre-anesthesia Checklist: Patient identified, Emergency Drugs available, Suction available, Patient being monitored and Timeout performed Patient Re-evaluated:Patient Re-evaluated prior to inductionOxygen Delivery Method: Circle system utilized Preoxygenation: Pre-oxygenation with 100% oxygen Intubation Type: IV induction and Rapid sequence Laryngoscope Size: Mac and 3 Grade View: Grade I Tube type: Oral Tube size: 7.5 mm Number of attempts: 1 Airway Equipment and Method: Stylet Placement Confirmation: ETT inserted through vocal cords under direct vision,  positive ETCO2 and breath sounds checked- equal and bilateral Secured at: 22 cm Tube secured with: Tape Dental Injury: Teeth and Oropharynx as per pre-operative assessment

## 2012-02-02 NOTE — Preoperative (Signed)
Beta Blockers   Reason not to administer Beta Blockers:Not Applicable 

## 2012-02-02 NOTE — H&P (View-Only) (Signed)
Erika [name] [id number] 08/11/1940  CARE TEAM:  PCP: MILLS,KRISTIN, MD  Outpatient Care Team: Patient Care Team: Kristin T Mills, MD as PCP - General (Internal Medicine)  Inpatient Treatment Team: Treatment Team: Attending Provider: Jerry Dale Joines, MD; Registered Nurse: Donnica Payne Harris, RN; Rounding Team: Imts - Lane (Rounding), MD; Consulting Physician: Md Ccs, MD; Technician: Ashley C Stimpson, NT; Registered Nurse: Jill Clayton Moore, RN; Respiratory Therapist: Mendie Faulkner Campbell, RRT; Technician: Deborah Jean Brown, NT  Subjective:  Pain down Feeling better  Objective:  Vital signs:  Filed Vitals:   02/01/12 2231 02/02/12 0210 02/02/12 0554 02/02/12 0828  BP: 148/77 144/83 123/54   Pulse: 86 102 100   Temp: 98.2 F (36.8 C) 99.3 F (37.4 C) 99.2 F (37.3 C)   TempSrc: Oral Oral Oral   Resp: 16 20 18   Height:      Weight:      SpO2: 90% 97% 90% 92%    Last BM Date: 02/01/12  Intake/Output   Yesterday:  06/28 0701 - 06/29 0700 In: 1048 [I.V.:1048] Out: 200 [Urine:200] This shift:     Bowel function:  Flatus: y  BM: n  Physical Exam:  General: Pt awake/alert/oriented x4 in no acute distress Eyes: PERRL, normal EOM.  Sclera clear.  No icterus Neuro: CN II-XII intact w/o focal sensory/motor deficits. Lymph: No head/neck/groin lymphadenopathy Psych:  No delerium/psychosis/paranoia HENT: Normocephalic, Mucus membranes moist.  No thrush Neck: Supple, No tracheal deviation Chest: No chest wall pain w good excursion CV:  Pulses intact.  Regular rhythm Abdomen: Soft.  Nondistended.  Mildly tender at epigastric area only.  No incarcerated hernias. Ext:  SCDs BLE.  No mjr edema.  No cyanosis Skin: No petechiae / purpurae  Results:   Labs: Results for orders placed during the hospital encounter of 02/01/12 (from the past 48 hour(s))  CBC WITH DIFFERENTIAL     Status: Abnormal   Collection Time   02/01/12 11:45 AM      Component Value Range  Comment   WBC 9.4  4.0 - 10.5 K/uL    RBC 4.83  3.87 - 5.11 MIL/uL    Hemoglobin 14.5  12.0 - 15.0 g/dL    HCT 43.3  36.0 - 46.0 %    MCV 89.6  78.0 - 100.0 fL    MCH 30.0  26.0 - 34.0 pg    MCHC 33.5  30.0 - 36.0 g/dL    RDW 14.2  11.5 - 15.5 %    Platelets 204  150 - 400 K/uL    Neutrophils Relative 89 (*) 43 - 77 %    Neutro Abs 8.4 (*) 1.7 - 7.7 K/uL    Lymphocytes Relative 6 (*) 12 - 46 %    Lymphs Abs 0.6 (*) 0.7 - 4.0 K/uL    Monocytes Relative 5  3 - 12 %    Monocytes Absolute 0.4  0.1 - 1.0 K/uL    Eosinophils Relative 0  0 - 5 %    Eosinophils Absolute 0.0  0.0 - 0.7 K/uL    Basophils Relative 0  0 - 1 %    Basophils Absolute 0.0  0.0 - 0.1 K/uL   COMPREHENSIVE METABOLIC PANEL     Status: Abnormal   Collection Time   02/01/12 11:45 AM      Component Value Range Comment   Sodium 131 (*) 135 - 145 mEq/L    Potassium 3.5  3.5 - 5.1 mEq/L    Chloride 95 (*)   96 - 112 mEq/L    CO2 23  19 - 32 mEq/L    Glucose, Bld 119 (*) 70 - 99 mg/dL    BUN 10  6 - 23 mg/dL    Creatinine, Ser 0.74  0.50 - 1.10 mg/dL    Calcium 9.5  8.4 - 10.5 mg/dL    Total Protein 8.9 (*) 6.0 - 8.3 g/dL    Albumin 3.7  3.5 - 5.2 g/dL    AST 183 (*) 0 - 37 U/L    ALT 83 (*) 0 - 35 U/L    Alkaline Phosphatase 160 (*) 39 - 117 U/L    Total Bilirubin 0.8  0.3 - 1.2 mg/dL    GFR calc non Af Amer 85 (*) >90 mL/min    GFR calc Af Amer >90  >90 mL/min   LIPASE, BLOOD     Status: Normal   Collection Time   02/01/12 11:45 AM      Component Value Range Comment   Lipase 13  11 - 59 U/L   TROPONIN I     Status: Normal   Collection Time   02/01/12 11:45 AM      Component Value Range Comment   Troponin I <0.30  <0.30 ng/mL   URINALYSIS, ROUTINE W REFLEX MICROSCOPIC     Status: Abnormal   Collection Time   02/01/12  1:10 PM      Component Value Range Comment   Color, Urine YELLOW  YELLOW    APPearance CLEAR  CLEAR    Specific Gravity, Urine 1.012  1.005 - 1.030    pH 8.0  5.0 - 8.0    Glucose, UA NEGATIVE   NEGATIVE mg/dL    Hgb urine dipstick TRACE (*) NEGATIVE    Bilirubin Urine NEGATIVE  NEGATIVE    Ketones, ur NEGATIVE  NEGATIVE mg/dL    Protein, ur NEGATIVE  NEGATIVE mg/dL    Urobilinogen, UA 0.2  0.0 - 1.0 mg/dL    Nitrite NEGATIVE  NEGATIVE    Leukocytes, UA NEGATIVE  NEGATIVE   URINE MICROSCOPIC-ADD ON     Status: Abnormal   Collection Time   02/01/12  1:10 PM      Component Value Range Comment   Squamous Epithelial / LPF FEW (*) RARE    WBC, UA 0-2  <3 WBC/hpf    RBC / HPF 7-10  <3 RBC/hpf    Bacteria, UA FEW (*) RARE   PROTIME-INR     Status: Normal   Collection Time   02/01/12  8:14 PM      Component Value Range Comment   Prothrombin Time 13.9  11.6 - 15.2 seconds    INR 1.05  0.00 - 1.49   HEMOGLOBIN A1C     Status: Normal   Collection Time   02/01/12  8:14 PM      Component Value Range Comment   Hemoglobin A1C 5.5  <5.7 %    Mean Plasma Glucose 111  <117 mg/dL   CARDIAC PANEL(CRET KIN+CKTOT+MB+TROPI)     Status: Abnormal   Collection Time   02/01/12  8:14 PM      Component Value Range Comment   Total CK 228 (*) 7 - 177 U/L    CK, MB 3.7  0.3 - 4.0 ng/mL    Troponin I <0.30  <0.30 ng/mL    Relative Index 1.6  0.0 - 2.5   HIV ANTIBODY (ROUTINE TESTING)     Status: Normal   Collection Time     02/01/12  8:14 PM      Component Value Range Comment   HIV NON REACTIVE  NON REACTIVE   CBC     Status: Abnormal   Collection Time   02/02/12  6:21 AM      Component Value Range Comment   WBC 9.8  4.0 - 10.5 K/uL    RBC 3.93  3.87 - 5.11 MIL/uL    Hemoglobin 11.7 (*) 12.0 - 15.0 g/dL    HCT 35.5 (*) 36.0 - 46.0 %    MCV 90.3  78.0 - 100.0 fL    MCH 29.8  26.0 - 34.0 pg    MCHC 33.0  30.0 - 36.0 g/dL    RDW 14.6  11.5 - 15.5 %    Platelets 190  150 - 400 K/uL   COMPREHENSIVE METABOLIC PANEL     Status: Abnormal   Collection Time   02/02/12  6:21 AM      Component Value Range Comment   Sodium 136  135 - 145 mEq/L    Potassium 3.9  3.5 - 5.1 mEq/L    Chloride 100  96 -  112 mEq/L    CO2 27  19 - 32 mEq/L    Glucose, Bld 86  70 - 99 mg/dL    BUN 14  6 - 23 mg/dL    Creatinine, Ser 1.10  0.50 - 1.10 mg/dL    Calcium 8.7  8.4 - 10.5 mg/dL    Total Protein 7.1  6.0 - 8.3 g/dL    Albumin 2.9 (*) 3.5 - 5.2 g/dL    AST 138 (*) 0 - 37 U/L    ALT 142 (*) 0 - 35 U/L    Alkaline Phosphatase 128 (*) 39 - 117 U/L    Total Bilirubin 0.7  0.3 - 1.2 mg/dL    GFR calc non Af Amer 50 (*) >90 mL/min    GFR calc Af Amer 58 (*) >90 mL/min   URINALYSIS, ROUTINE W REFLEX MICROSCOPIC     Status: Abnormal   Collection Time   02/02/12  6:43 AM      Component Value Range Comment   Color, Urine ORANGE (*) YELLOW BIOCHEMICALS MAY BE AFFECTED BY COLOR   APPearance CLOUDY (*) CLEAR    Specific Gravity, Urine 1.025  1.005 - 1.030    pH 5.5  5.0 - 8.0    Glucose, UA NEGATIVE  NEGATIVE mg/dL    Hgb urine dipstick SMALL (*) NEGATIVE    Bilirubin Urine SMALL (*) NEGATIVE    Ketones, ur NEGATIVE  NEGATIVE mg/dL    Protein, ur NEGATIVE  NEGATIVE mg/dL    Urobilinogen, UA 1.0  0.0 - 1.0 mg/dL    Nitrite NEGATIVE  NEGATIVE    Leukocytes, UA TRACE (*) NEGATIVE   URINE MICROSCOPIC-ADD ON     Status: Abnormal   Collection Time   02/02/12  6:43 AM      Component Value Range Comment   Squamous Epithelial / LPF RARE  RARE    WBC, UA 0-2  <3 WBC/hpf    RBC / HPF 3-6  <3 RBC/hpf    Bacteria, UA RARE  RARE    Casts HYALINE CASTS (*) NEGATIVE     Imaging / Studies: Us Abdomen Complete  02/01/2012  *RADIOLOGY REPORT*  Clinical Data:  Abdominal pain.  COMPLETE ABDOMINAL ULTRASOUND  Comparison:  None.  Findings:  Gallbladder:  A few small mobile gallstones noted.  No wall thickening.  Negative sonographic Murphy's.    Common bile duct:   Mildly dilated, 9 mm.  Cannot visualize the distal common bile duct to exclude distal stone.  Liver:  Suspect slight intrahepatic biliary ductal dilatation.  No focal abnormality.  Normal echotexture.  IVC:  Appears normal.  Pancreas:  No focal abnormality seen.   Spleen:  Within normal limits in size and echotexture.  Right Kidney:   Normal in size and parenchymal echogenicity.  No evidence of mass or hydronephrosis.  Left Kidney:  Normal in size and parenchymal echogenicity.  No evidence of mass or hydronephrosis.  Abdominal aorta:  No aneurysm identified.  IMPRESSION: A few small gravel-like stones in the gallbladder.  No changes of acute cholecystitis.  Mild intrahepatic and extrahepatic biliary ductal dilatation.  The distal common bile duct cannot be visualized due to overlying bowel gas.  I cannot exclude a distal obstructing CBD stone.  Recommend correlation with LFTs.  If further evaluation is felt warranted, MRCP may be beneficial.  Original Report Authenticated By: KEVIN G. DOVER, M.D.    Medications / Allergies: per chart  Antibiotics: Anti-infectives    None      Problem List:  Principal Problem:  *Cholelithiasis with obstruction Active Problems:  HYPERLIPIDEMIA  HYPERTENSION  ASTHMA  TACHYCARDIA   Assessment  Analie L Luft  71 y.o. female       Cholecystitis w improving LFTs  Plan:  Lap chole 1st.  Hold off on ERCP/GI eval.  D/w Dr. Thompson whom agrees & will operate later today  The anatomy & physiology of hepatobiliary & pancreatic function was discussed.  The pathophysiology of gallbladder dysfunction was discussed.  Natural history risks without surgery was discussed.   I feel the risks of no intervention will lead to serious problems that outweigh the operative risks; therefore, I recommended cholecystectomy to remove the pathology.  I explained laparoscopic techniques with possible need for an open approach.  Probable cholangiogram to evaluate the bilary tract was explained as well.    Risks such as bleeding, infection, abscess, leak, injury to other organs, need for further treatment, heart attack, death, and other risks were discussed.  I noted a good likelihood this will help address the problem.  Possibility that  this will not correct all abdominal symptoms was explained.  Goals of post-operative recovery were discussed as well.  We will work to minimize complications.  An educational handout further explaining the pathology and treatment options was given as well.  Questions were answered.  The patient expresses understanding & wishes to proceed with surgery.  -VTE prophylaxis- SCDs, etc -mobilize as tolerated to help recovery  Kursten Kruk C. Nickalus Thornsberry, M.D., F.A.C.S. Gastrointestinal and Minimally Invasive Surgery [facility] [address], [address] [place], [place] [address] [phone] Main / Paging [phone] Voice Mail   02/02/2012     

## 2012-02-02 NOTE — H&P (Signed)
Internal Medicine Teaching Service Attending Note Date: 02/02/2012  Patient name: Erika [name]  Medical record number: [id number]  Date of birth: [birth year]   I have seen and evaluated Quay Burow and discussed their care with the Residency Team. Please see Dr Evorn Gong H&P for full details. Ms Clyde Lundborg is a 71 yo female who was just seen in the [facility] on the 26th for routine care. She developed epigastric and RUQ pain on the PM of the 27th. It was assoc with N/V. She was afebrile. She presented to the ED and where LFT's were elevated and her U/S showed small gallstones, mild intrahepatic and extrahepatic biliary ductal dilatation, and a common bile duct obstructing stone could not be excluded. Surgery was able to eval patient and is currently making plans to take the pt to surgery for lap chole as her LFT's have slightly trended down.   Her PMHx is sig for HTN, asthma, HLD, and obesity. Her meds, allergies, social, and fam hx were reviewed with Dr Clyde Lundborg.  Exam : Sitting SOB on tele. NAD ABD : decreased BD, soft, NT ND Ext : trace edema B HRRR LCTAB Neuro No focal  Labs pertinent for ABC 9.4, HgB 14.5, K 3.5, Cr 0.74 AST 183 --> 138 ALT 83 --> 142 Alk phos 160 --> 128 T Bili 0.8 --> 0.7   Assessment and Plan: I agree with the formulated Assessment and Plan with the following changes:   1. Obstructive cholelithiasis - surgery plans on lap chole today. Pt is medically stable.   2. Asthma - cont alb and atrovent  3. HTN - well controlled on Norvasc  4. Hyperlipidemia - outpt mgmt. LDL was 114 in March

## 2012-02-02 NOTE — Progress Notes (Signed)
Subjective:  Patient feels better. Her abdominal pain is mild. She doesn't have nausea or vomiting. She is afebrile.  Objective: Vital signs in last 24 hours: Filed Vitals:   02/01/12 2231 02/02/12 0210 02/02/12 0554 02/02/12 0828  BP: 148/77 144/83 123/54   Pulse: 86 102 100   Temp: 98.2 F (36.8 C) 99.3 F (37.4 C) 99.2 F (37.3 C)   TempSrc: Oral Oral Oral   Resp: 16 20 18    Height:      Weight:      SpO2: 90% 97% 90% 92%   Weight change:   Intake/Output Summary (Last 24 hours) at 02/02/12 1158 Last data filed at 02/02/12 0630  Gross per 24 hour  Intake   1048 ml  Output    200 ml  Net    848 ml   Constitutional: Vital signs reviewed.  Patient is a obese and well-nourished  in no acute distress and cooperative with exam. Alert and oriented x3.   Mouth: no erythema or exudates, MMM  Neck: Supple,  Cardiovascular: RRR, S1 normal, S2 normal, no MRG, pulses symmetric and intact bilaterally Pulmonary/Chest: CTAB, no wheezes, rales, or rhonchi Abdominal: Soft. Obese, tender over epigastric and right upper quadrant on palpation, no guarding or rebound pain, Murphy's sign positive. Bowel sounds are normal GU: no CVA tenderness. Musculoskeletal: No  ROM full and nontender Neurological: A&O x3, no focal motor deficit, sensory intact to light touch bilaterally.  Skin: Warm, dry and intact. No rash, cyanosis, or clubbing.    Lab Results: Basic Metabolic Panel:  Lab 02/02/12 1610 02/01/12 1145  NA 136 131*  K 3.9 3.5  CL 100 95*  CO2 27 23  GLUCOSE 86 119*  BUN 14 10  CREATININE 1.10 0.74  CALCIUM 8.7 9.5  MG -- --  PHOS -- --   Liver Function Tests:  Lab 02/02/12 0621 02/01/12 1145  AST 138* 183*  ALT 142* 83*  ALKPHOS 128* 160*  BILITOT 0.7 0.8  PROT 7.1 8.9*  ALBUMIN 2.9* 3.7    Lab 02/01/12 1145  LIPASE 13  AMYLASE --   No results found for this basename: AMMONIA:2 in the last 168 hours CBC:  Lab 02/02/12 0621 02/01/12 1145  WBC 9.8 9.4  NEUTROABS  -- 8.4*  HGB 11.7* 14.5  HCT 35.5* 43.3  MCV 90.3 89.6  PLT 190 204   Cardiac Enzymes:  Lab 02/01/12 2014 02/01/12 1145  CKTOTAL 228* --  CKMB 3.7 --  CKMBINDEX -- --  TROPONINI <0.30 <0.30   BNP: No results found for this basename: PROBNP:3 in the last 168 hours D-Dimer: No results found for this basename: DDIMER:2 in the last 168 hours CBG: No results found for this basename: GLUCAP:6 in the last 168 hours Hemoglobin A1C:  Lab 02/01/12 2014  HGBA1C 5.5   Fasting Lipid Panel: No results found for this basename: CHOL,HDL,LDLCALC,TRIG,CHOLHDL,LDLDIRECT in the last 960 hours Thyroid Function Tests: No results found for this basename: TSH,T4TOTAL,FREET4,T3FREE,THYROIDAB in the last 168 hours Coagulation:  Lab 02/01/12 2014  LABPROT 13.9  INR 1.05   Anemia Panel: No results found for this basename: VITAMINB12,FOLATE,FERRITIN,TIBC,IRON,RETICCTPCT in the last 168 hours Urine Drug Screen: Drugs of Abuse     Component Value Date/Time   LABOPIA NONE DETECTED 02/17/2008 2024   COCAINSCRNUR NONE DETECTED 02/17/2008 2024   LABBENZ NONE DETECTED 02/17/2008 2024   AMPHETMU NONE DETECTED 02/17/2008 2024   THCU NONE DETECTED 02/17/2008 2024   LABBARB  Value: NONE DETECTED  DRUG SCREEN FOR MEDICAL PURPOSES ONLY.  IF CONFIRMATION IS NEEDED FOR ANY PURPOSE, NOTIFY LAB WITHIN 5 DAYS. 02/17/2008 2024    Alcohol Level: No results found for this basename: ETH:2 in the last 168 hours Urinalysis:  Lab 02/02/12 0643 02/01/12 1310  COLORURINE ORANGE* YELLOW  LABSPEC 1.025 1.012  PHURINE 5.5 8.0  GLUCOSEU NEGATIVE NEGATIVE  HGBUR SMALL* TRACE*  BILIRUBINUR SMALL* NEGATIVE  KETONESUR NEGATIVE NEGATIVE  PROTEINUR NEGATIVE NEGATIVE  UROBILINOGEN 1.0 0.2  NITRITE NEGATIVE NEGATIVE  LEUKOCYTESUR TRACE* NEGATIVE    Micro Results: No results found for this or any previous visit (from the past 240 hour(s)). Studies/Results: US Abdomen Complete  02/01/2012  *RADIOLOGY REPORT*   Clinical Data:  Abdominal pain.  COMPLETE ABDOMINAL ULTRASOUND  Comparison:  None.  Findings:  Gallbladder:  A few small mobile gallstones noted.  No wall thickening.  Negative sonographic Murphy's.  Common bile duct:   Mildly dilated, 9 mm.  Cannot visualize the distal common bile duct to exclude distal stone.  Liver:  Suspect slight intrahepatic biliary ductal dilatation.  No focal abnormality.  Normal echotexture.  IVC:  Appears normal.  Pancreas:  No focal abnormality seen.  Spleen:  Within normal limits in size and echotexture.  Right Kidney:   Normal in size and parenchymal echogenicity.  No evidence of mass or hydronephrosis.  Left Kidney:  Normal in size and parenchymal echogenicity.  No evidence of mass or hydronephrosis.  Abdominal aorta:  No aneurysm identified.  IMPRESSION: A few small gravel-like stones in the gallbladder.  No changes of acute cholecystitis.  Mild intrahepatic and extrahepatic biliary ductal dilatation.  The distal common bile duct cannot be visualized due to overlying bowel gas.  I cannot exclude a distal obstructing CBD stone.  Recommend correlation with LFTs.  If further evaluation is felt warranted, MRCP may be beneficial.  Original Report Authenticated By: Cyndie Chime, M.D.   Medications:  Scheduled Meds:   . sodium chloride   Intravenous STAT  . albuterol  5 mg Nebulization Once  . antiseptic oral rinse  15 mL Mouth Rinse q12n4p  .  ceFAZolin (ANCEF) IV  2 g Intravenous 60 min Pre-Op  . chlorhexidine  1 application Topical Once  . chlorhexidine  15 mL Mouth Rinse BID  . enoxaparin  40 mg Subcutaneous Q24H  . Fluticasone-Salmeterol  1 puff Inhalation BID  .  HYDROmorphone (DILAUDID) injection  1 mg Intravenous Once  . ipratropium  0.5 mg Nebulization Once  . ondansetron  4 mg Intravenous Once  . sodium chloride  1,000 mL Intravenous Once  . DISCONTD: chlorhexidine  1 application Topical Once   Continuous Infusions:   . sodium chloride 100 mL/hr at 02/02/12  0509   PRN Meds:.albuterol, HYDROmorphone (DILAUDID) injection, HYDROmorphone (DILAUDID) injection, ondansetron (ZOFRAN) IV, ondansetron, DISCONTD: ondansetron (ZOFRAN) IV Assessment/Plan:  1. Abdominal pain with nausea and vomiting likely in the setting of acute cholelithiasis considering abdominal ultrasound which is notable for gallstones with mild intrahepatic and  biliary ductal dilation as well as AST and ALT elevation. Other differential diagnoses include, but less likely, small bowel obstruction, pancreatitis (lipase was within normal limits). Surgery was consulted, Dr. Janee Morn evaluated patient and decided to do LAPAROSCOPIC CHOLECYSTECTOMY WITH INTRAOPERATIVE CHOLANGIOGRAM.   - Appreciate surgeon's consultation in managing our patient. Will follow up recommendations. -NPO and Normal saline at 100 cc /hr - Dilaudid 1 mg every 4 hours for pain control   - Zofran for nausea    2. Hypertension: Blood pressure initially  elevated likely in the setting of pain. Current Bp is 123/54. Holding home medications as of now.   - Will continue to monitor   3. Hyperlipidemia ; LDL 114 in 10/2011. Patient has no history of diabetes.  A1c is 5.5. Will not start new medications now.   4. Tachycardia likely in the setting of pain. TSH normal in September of 2012. EKG was within normal limits except a few PVCs.   - We'll continue to monitor her   - Control pain   - IV fluids    5.Asthma ; stable will continue Advair and albuterol when necessary .   6. DVT ; Lovenox      LOS: 1 day   Lorretta Harp 02/02/2012, 11:58 AM

## 2012-02-02 NOTE — Op Note (Signed)
02/01/2012 - 02/02/2012  1:21 PM  PATIENT:  Erika [name]  71 y.o. female  PRE-OPERATIVE DIAGNOSIS:  chronic cholecystitis; increased liver function labs  POST-OPERATIVE DIAGNOSIS:  Chronic Cholecystitis; increase liver function labs  PROCEDURE:  Procedure(s): LAPAROSCOPIC CHOLECYSTECTOMY  SURGEON:  Surgeon(s): Liz Malady, MD Ardeth Sportsman, MD  PHYSICIAN ASSISTANT:   ASSISTANTS: Karie Soda, MD  ANESTHESIA:   local and general  EBL:     BLOOD ADMINISTERED:none  DRAINS: none   SPECIMEN:  Excision  DISPOSITION OF SPECIMEN:  PATHOLOGY  COUNTS:  YES  DICTATION: .Dragon DictationPatient was admitted with epigastric pain and elevated liver function tests. Liver function tests improved this morning. She is brought for cholecystectomy. She was identified in the preop holding area. Informed consent was obtained. She received intravenous antibiotics. She was brought to the operating room and general endotracheal anesthesia was administered by the anesthesia staff. Her abdomen was prepped and draped in a sterile fashion. Time out procedure was done. Supraumbilical region was infiltrated with quarter percent Marcaine with epinephrine. Supraumbilical incision was made. Subcutaneous tissues were dissected down revealing the anterior fascia. This was divided sharply along the midline. Peritoneal cavity was entered under direct vision. 0 Vicryl pursestring suture was placed around the fascial opening. Hassan trocar was inserted. Abdomen was insufflated with carbon dioxide in standard fashion. Under direct vision a 5 mm epigastric and 2 5 mm right lateral ports were placed. Dome the gallbladder was retracted superior medially. Filmy omental adhesions were swept down off the body and infundibulum. The infundibulum was retracted inferior laterally. Dissection began laterally and progressed medially. This easily identified the cystic duct. It was somewhat short.Dissection continued until  critical view was obtained between the cystic duct, infundibulum, and liver. Clip was placed at the infundibular cystic duct junction. Cholangiogram catheter was inserted but were unable to get a clip correctly sealed despite multiple attempts. Due to the danger of damaging the short short cystic duct with further manipulation, cholangiogram was abandoned. 3 clips were placed proximally on the cystic duct and it was divided. Cystic artery was dissected out, clipped twice proximally and once distally, and divided. Gallbladder was taken off the liver bed with cautery achieving excellent hemostasis. Gallbladder was placed in an Endo Catch bag and removed from the supraumbilical port site. Liver bed was copiously irrigated. Clips remain in good position. Cautery was used to ensure excellent hemostasis. Irrigation fluid returned clear. Ports were removed under direct vision. Pneumoperitoneum was released. Supraumbilical fascia was closed by tying the 0 Vicryl pursestring suture. All 4 wounds were copiously irrigated and the skin of each was closed with running 4-0 Vicryl subcuticular stitch followed by Dermabond. All counts were correct. Patient tolerated procedure well without apparent complication was taken recovery in stable condition.  PATIENT DISPOSITION:  PACU - hemodynamically stable.   Delay start of Pharmacological VTE agent (>24hrs) due to surgical blood loss or risk of bleeding:  no  Violeta Gelinas, MD, MPH, FACS Pager: [phone]  6/29/20131:21 PM

## 2012-02-02 NOTE — Anesthesia Postprocedure Evaluation (Signed)
  Anesthesia Post-op Note  Patient: Erika [name]  Procedure(s) Performed: Procedure(s) (LRB): LAPAROSCOPIC CHOLECYSTECTOMY (N/A)  Patient Location: PACU  Anesthesia Type: General  Level of Consciousness: awake and patient cooperative  Airway and Oxygen Therapy: Patient Spontanous Breathing and Patient remains intubated per anesthesia plan  Post-op Pain: mild  Post-op Assessment: Post-op Vital signs reviewed, Patient's Cardiovascular Status Stable and Respiratory Function Stable  Post-op Vital Signs: Reviewed and stable  Complications: Patient re-intubated in the OR after a failed attempt of extubation.  She appeared weak. Small tidal volumes and dropping O2 saturation triggered need for reintubation.

## 2012-02-02 NOTE — Progress Notes (Signed)
Patient ID: Erika [name], female   DOB: [birth year], 71 y.o.   MRN: [id number] Patient required reintubation in the PACU.  On ventilator CP/PS 5/10 could only pull 280cc.  Admitted ti ICU and I consulted Dr. Colletta Maryland from [facility] as the patient is admitted under the IM teaching service.  I appreciate their assist. I spoke to the patient's son regarding this change. Erika Gelinas, MD, MPH, FACS Pager: [phone]

## 2012-02-02 NOTE — Anesthesia Preprocedure Evaluation (Addendum)
Anesthesia Evaluation  Patient identified by MRN, date of birth, ID band Patient awake    Reviewed: Allergy & Precautions, H&P , NPO status , Patient's Chart, lab work & pertinent test results  History of Anesthesia Complications Negative for: history of anesthetic complications  Airway Mallampati: II  Neck ROM: full    Dental  (+) Edentulous Upper and Dental Advisory Given   Pulmonary asthma ,  H/O TB Daily inhalers          Cardiovascular hypertension, Pt. on medications + dysrhythmias Supra Ventricular Tachycardia Rhythm:Regular Rate:Normal     Neuro/Psych    GI/Hepatic GERD-  Medicated and Controlled,  Endo/Other  Morbid obesity  Renal/GU      Musculoskeletal  (+) Arthritis -,   Abdominal   Peds  Hematology   Anesthesia Other Findings   Reproductive/Obstetrics                          Anesthesia Physical Anesthesia Plan  ASA: III  Anesthesia Plan: General   Post-op Pain Management:    Induction: Intravenous  Airway Management Planned: Oral ETT  Additional Equipment:   Intra-op Plan:   Post-operative Plan: Extubation in OR  Informed Consent: I have reviewed the patients History and Physical, chart, labs and discussed the procedure including the risks, benefits and alternatives for the proposed anesthesia with the patient or authorized representative who has indicated his/her understanding and acceptance.     Plan Discussed with: CRNA and Surgeon  Anesthesia Plan Comments:         Anesthesia Quick Evaluation

## 2012-02-02 NOTE — Progress Notes (Signed)
Pt was unable to take off her left hand wedding band which is yellow, taped to finger.

## 2012-02-02 NOTE — Progress Notes (Signed)
Patient ID: Erika [name], female   DOB: 01-21-1941, 71 y.o.   MRN: [id number] Patient examined.  I discussed her care with Dr. Michaell Cowing and Dr, Magnus Ivan.  Plan lap chole with IOC today. I discussed the procedure in detail.  We discussed the risks and benefits of a laparoscopic cholecystectomy and possible cholangiogram including, but not limited to bleeding, infection, injury to surrounding structures such as the intestine or liver, bile leak, retained gallstones, need to convert to an open procedure, prolonged diarrhea, blood clots such as  DVT, common bile duct injury, anesthesia risks, and possible need for additional procedures.  The likelihood of improvement in symptoms and return to the patient's normal status is good. We discussed the typical post-operative recovery course. Erika Gelinas, MD, MPH, FACS Pager: [phone]

## 2012-02-03 ENCOUNTER — Inpatient Hospital Stay (HOSPITAL_COMMUNITY): Payer: Medicare Other

## 2012-02-03 DIAGNOSIS — J45909 Unspecified asthma, uncomplicated: Secondary | ICD-10-CM

## 2012-02-03 LAB — CARDIAC PANEL(CRET KIN+CKTOT+MB+TROPI)
CK, MB: 5.1 ng/mL — ABNORMAL HIGH (ref 0.3–4.0)
CK, MB: 3.7 ng/mL (ref 0.3–4.0)
Relative Index: 1.5 (ref 0.0–2.5)
Relative Index: 0.8 (ref 0.0–2.5)
Total CK: 350 U/L — ABNORMAL HIGH (ref 7–177)
Total CK: 473 U/L — ABNORMAL HIGH (ref 7–177)

## 2012-02-03 LAB — BLOOD GAS, ARTERIAL
Bicarbonate: 25.3 mEq/L — ABNORMAL HIGH (ref 20.0–24.0)
PEEP: 5 cmH2O
Patient temperature: 100.2
TCO2: 26.6 mmol/L (ref 0–100)
pCO2 arterial: 45.1 mmHg — ABNORMAL HIGH (ref 35.0–45.0)
pH, Arterial: 7.372 (ref 7.350–7.400)
pO2, Arterial: 86.4 mmHg (ref 80.0–100.0)

## 2012-02-03 LAB — URINE CULTURE
Colony Count: NO GROWTH
Culture: NO GROWTH

## 2012-02-03 LAB — BASIC METABOLIC PANEL
BUN: 9 mg/dL (ref 6–23)
Calcium: 8.6 mg/dL (ref 8.4–10.5)
Creatinine, Ser: 0.82 mg/dL (ref 0.50–1.10)
GFR calc Af Amer: 83 mL/min — ABNORMAL LOW (ref >90)
GFR calc non Af Amer: 71 mL/min — ABNORMAL LOW (ref >90)
Glucose, Bld: 89 mg/dL (ref 70–99)

## 2012-02-03 LAB — CBC
HCT: 35.3 % — ABNORMAL LOW (ref 36.0–46.0)
Hemoglobin: 11.7 g/dL — ABNORMAL LOW (ref 12.0–15.0)
MCH: 29.8 pg (ref 26.0–34.0)
MCHC: 33.1 g/dL (ref 30.0–36.0)
RDW: 14.9 % (ref 11.5–15.5)

## 2012-02-03 LAB — LACTIC ACID, PLASMA: Lactic Acid, Venous: 1.7 mmol/L (ref 0.5–2.2)

## 2012-02-03 MED ORDER — FLUTICASONE-SALMETEROL 250-50 MCG/DOSE IN AEPB
1.0000 | INHALATION_SPRAY | Freq: Two times a day (BID) | RESPIRATORY_TRACT | Status: DC
  Administered 2012-02-03 – 2012-02-05 (×6): 1 via RESPIRATORY_TRACT
  Filled 2012-02-03: qty 14

## 2012-02-03 MED ORDER — FUROSEMIDE 10 MG/ML IJ SOLN
40.0000 mg | Freq: Once | INTRAMUSCULAR | Status: AC
  Administered 2012-02-03: 40 mg via INTRAVENOUS
  Filled 2012-02-03: qty 4

## 2012-02-03 MED ORDER — ALBUTEROL SULFATE HFA 108 (90 BASE) MCG/ACT IN AERS: 2.0000 | INHALATION_SPRAY | RESPIRATORY_TRACT | Status: DC | PRN

## 2012-02-03 NOTE — Progress Notes (Signed)
Name: Erika [name] MRN: [id number] DOB: 1941/04/07    LOS: 2 DAt of admit 02/01/2012   Referring Provider:  Violeta Gelinas Reason for Referral:  Post-op respiratory failure  PULMONARY / CRITICAL CARE MEDICINE  HPI:  71 yo female admitted 02/01/2012 abdominal pain and increased LFT's from acute cholecystitis.  She had laparoscopic cholecystectomy 6/29.  She remained on vent post-op and PCCM consulted.   Events Since Admission: 6/29 reitubation in PACU  Current Status: Tolerating SBT.  Denies chest pain, dyspnea.  Reports feeling hungry.  Vital Signs: Temp:  [97.7 F (36.5 C)-100.2 F (37.9 C)] 100 F (37.8 C) (06/30 0714) Pulse Rate:  [75-113] 113  (06/30 0900) Resp:  [12-23] 20  (06/30 0900) BP: (114-183)/(52-100) 121/72 mmHg (06/30 0900) SpO2:  [93 %-100 %] 99 % (06/30 0900) FiO2 (%):  [40 %] 40 % (06/30 0747) Weight:  [332 lb 14.3 oz (151 kg)-339 lb 8.1 oz (154 kg)] 339 lb 8.1 oz (154 kg) (06/30 0600)  Physical Examination: General:  No distress Neuro:  Alert, follows commands, moves all extremities HEENT:  ETT in place Neck:  No LAN Cardiovascular:  s1s2 regular, no murmur Lungs:  Basilar rales, no wheeze Abdomen:  Non tender Musculoskeletal:  SCD in place Skin:  No rashes  US Abdomen Complete  02/01/2012  *RADIOLOGY REPORT*  Clinical Data:  Abdominal pain.  COMPLETE ABDOMINAL ULTRASOUND  Comparison:  None.  Findings:  Gallbladder:  A few small mobile gallstones noted.  No wall thickening.  Negative sonographic Murphy's.  Common bile duct:   Mildly dilated, 9 mm.  Cannot visualize the distal common bile duct to exclude distal stone.  Liver:  Suspect slight intrahepatic biliary ductal dilatation.  No focal abnormality.  Normal echotexture.  IVC:  Appears normal.  Pancreas:  No focal abnormality seen.  Spleen:  Within normal limits in size and echotexture.  Right Kidney:   Normal in size and parenchymal echogenicity.  No evidence of mass or hydronephrosis.  Left Kidney:   Normal in size and parenchymal echogenicity.  No evidence of mass or hydronephrosis.  Abdominal aorta:  No aneurysm identified.  IMPRESSION: A few small gravel-like stones in the gallbladder.  No changes of acute cholecystitis.  Mild intrahepatic and extrahepatic biliary ductal dilatation.  The distal common bile duct cannot be visualized due to overlying bowel gas.  I cannot exclude a distal obstructing CBD stone.  Recommend correlation with LFTs.  If further evaluation is felt warranted, MRCP may be beneficial.  Original Report Authenticated By: Cyndie Chime, M.D.   Dg Chest Port 1 View  02/03/2012  *RADIOLOGY REPORT*  Clinical Data: Respiratory failure.  PORTABLE CHEST - 1 VIEW  Comparison: Chest x-ray 02/02/2012.  Findings: An endotracheal tube is in place with tip 3.6 cm above the carina. Lung volumes are low.  There are bibasilar opacities which are favored to represent areas of subsegmental atelectasis. There is cephalization of the pulmonary vasculature and slight indistinctness of the interstitial markings suggestive of mild pulmonary edema.   Blunting of the left costophrenic sulcus is favored to be technique related, although a small left-sided pleural effusion is difficult to exclude.  Heart size is mildly enlarged (unchanged).  Mediastinal contours are grossly distorted by patient positioning and rotation.  IMPRESSION: 1.  Overall, the radiographic appearance of chest is very similar to the prior examination, with an appearance most suggestive of mild congestive heart failure, as detailed above.  Original Report Authenticated By: Florencia Reasons, M.D.   Dg Chest  Port 1 View  02/02/2012  *RADIOLOGY REPORT*  Clinical Data: Respiratory failure.  Shortness of breath and cough  PORTABLE CHEST - 1 VIEW  Comparison: 01/22/2009  Findings: ET tube tip is above the carina.  There is a there is moderate cardiac enlargement.  Pulmonary venous congestion is noted.  No pleural effusion or overt edema.   IMPRESSION:  1.  ET tube tip is above the carina. 2.  Cardiac enlargement and pulmonary venous congestion.  Original Report Authenticated By: Rosealee Albee, M.D.    ASSESSMENT AND PLAN  PULMONARY  Lab 02/03/12 0435 02/02/12 1712  PHART 7.372 7.319*  PCO2ART 45.1* 50.5*  PO2ART 86.4 67.0*  HCO3 25.3* 25.9*  O2SAT 95.8 91.0   Ventilator Settings: Vent Mode:  [-] CPAP FiO2 (%):  [40 %] 40 % Set Rate:  [15 bmp] 15 bmp Vt Set:  [500 mL-600 mL] 500 mL PEEP:  [5 cmH20] 5 cmH20 Pressure Support:  [5 cmH20] 5 cmH20 Plateau Pressure:  [22 cmH20-26 cmH20] 24 cmH20   ETT:  6/29 x 2>>6/30  A: Post-op respiratory failure likely from residual effects of anesthesia in setting of morbid obesity, and hx of asthma.  No reported hx of OSA. P:   Proceed with extubation 6/30 Restart advair 250/50 one puff bid Albuterol PRN Monitor for sleep disordered breathing Give lasix IV x one dose 6/30   CARDIOVASCULAR  Lines:  None  A: Hx of HTN with chronic grade 1 diastolic dysfx P:  Resume amlodipine 10 mg qd, and ASA 81 mg qd when able to swallow   RENAL  Lab 02/02/12 1624 02/02/12 0621 02/01/12 1145  NA 135 136 131*  K 3.6 3.9 --  CL 100 100 95*  CO2 25 27 23   BUN 15 14 10   CREATININE 1.10 1.10 0.74  CALCIUM 8.7 8.7 9.5  MG 1.8 -- --  PHOS 3.0 -- --   Intake/Output      06/29 0701 - 06/30 0700 06/30 0701 - 07/01 0700   I.V. (mL/kg) 2641.1 (17.2) 118.1 (0.8)   Total Intake(mL/kg) 2641.1 (17.2) 118.1 (0.8)   Urine (mL/kg/hr) 1905 (0.5)    Total Output 1905    Net +736.1 +118.1        Urine Occurrence 1 x     Foley:  6/29>>  A:  No active issues.   GASTROINTESTINAL  Lab 02/02/12 1624 02/02/12 0621 02/01/12 1145  AST 155* 138* 183*  ALT 151* 142* 83*  ALKPHOS 136* 128* 160*  BILITOT 0.7 0.7 0.8  PROT 7.4 7.1 8.9*  ALBUMIN 3.0* 2.9* 3.7    A: Increased LFT 2nd to acute on chronic cholecystitis s/p lap chole. P:   Post-op care and diet advancement per  surgery F/u LFT's per surgery   HEMATOLOGIC  Lab 02/02/12 1624 02/02/12 0621 02/01/12 2014 02/01/12 1145  HGB 11.9* 11.7* -- 14.5  HCT 36.6 35.5* -- 43.3  PLT 182 190 -- 204  INR 1.18 -- 1.05 --  APTT 35 -- -- --   A:   No active issues.   INFECTIOUS  Lab 02/02/12 1642 02/02/12 1624 02/02/12 0621 02/01/12 1145  WBC -- 9.7 9.8 9.4  PROCALCITON 2.48 -- -- --   Cultures: Blood 6/29>> Urine 6/29>> Sputum 6/29>>  Antibiotics: None  A:   No active issues   ENDOCRINE  A:  No active issues   NEUROLOGIC  A:  Pain control. P:   PRN fentanyl   BEST PRACTICE / DISPOSITION  DVT Px: SCD GI  Px:  Protonix   Updated family at bedside.  Coralyn Helling, MD [facility] Pulmonary/Critical Care 02/03/2012, 9:17 AM Pager:  [phone] After 3pm call: [phone]      .

## 2012-02-03 NOTE — Procedures (Signed)
Extubation Procedure Note  Patient Details:   Name: Erika [name] DOB: 04/01/1941 MRN: [id number]   Airway Documentation:     Evaluation  O2 sats: stable throughout and currently acceptable Complications: No apparent complications Patient did tolerate procedure well. Bilateral Breath Sounds: Rhonchi Suctioning: Airway Yes Pt awake and alert, extubated per MD order, Placed on 4L [place], sats 97% Positive cuff leak. BBS cl. Pt able to vocalize.  Arloa Koh 02/03/2012, 9:57 AM

## 2012-02-03 NOTE — Progress Notes (Signed)
1 Day Post-Op  Subjective: Intubated, but awake Denies abdominal pain  Objective: Vital signs in last 24 hours: Temp:  [97.7 F (36.5 C)-100.2 F (37.9 C)] 100 F (37.8 C) (06/30 0714) Pulse Rate:  [75-112] 106  (06/30 0747) Resp:  [12-23] 17  (06/30 0747) BP: (114-183)/(52-100) 119/62 mmHg (06/30 0747) SpO2:  [92 %-100 %] 100 % (06/30 0747) FiO2 (%):  [40 %] 40 % (06/30 0747) Weight:  [332 lb 14.3 oz (151 kg)-339 lb 8.1 oz (154 kg)] 339 lb 8.1 oz (154 kg) (06/30 0600) Last BM Date: 02/01/12  Intake/Output from previous day: 06/29 0701 - 06/30 0700 In: 2641.1 [I.V.:2641.1] Out: 1905 [Urine:1905] Intake/Output this shift: Total I/O In: 18.1 [I.V.:18.1] Out: -   Intubated Abdomen soft, minimally tender  Lab Results:   Basename 02/02/12 1624 02/02/12 0621  WBC 9.7 9.8  HGB 11.9* 11.7*  HCT 36.6 35.5*  PLT 182 190   BMET  Basename 02/02/12 1624 02/02/12 0621  NA 135 136  K 3.6 3.9  CL 100 100  CO2 25 27  GLUCOSE 103* 86  BUN 15 14  CREATININE 1.10 1.10  CALCIUM 8.7 8.7   PT/INR  Basename 02/02/12 1624 02/01/12 2014  LABPROT 15.3* 13.9  INR 1.18 1.05   ABG  Basename 02/03/12 0435 02/02/12 1712  PHART 7.372 7.319*  HCO3 25.3* 25.9*    Studies/Results: US Abdomen Complete  02/01/2012  *RADIOLOGY REPORT*  Clinical Data:  Abdominal pain.  COMPLETE ABDOMINAL ULTRASOUND  Comparison:  None.  Findings:  Gallbladder:  A few small mobile gallstones noted.  No wall thickening.  Negative sonographic Murphy's.  Common bile duct:   Mildly dilated, 9 mm.  Cannot visualize the distal common bile duct to exclude distal stone.  Liver:  Suspect slight intrahepatic biliary ductal dilatation.  No focal abnormality.  Normal echotexture.  IVC:  Appears normal.  Pancreas:  No focal abnormality seen.  Spleen:  Within normal limits in size and echotexture.  Right Kidney:   Normal in size and parenchymal echogenicity.  No evidence of mass or hydronephrosis.  Left Kidney:  Normal in  size and parenchymal echogenicity.  No evidence of mass or hydronephrosis.  Abdominal aorta:  No aneurysm identified.  IMPRESSION: A few small gravel-like stones in the gallbladder.  No changes of acute cholecystitis.  Mild intrahepatic and extrahepatic biliary ductal dilatation.  The distal common bile duct cannot be visualized due to overlying bowel gas.  I cannot exclude a distal obstructing CBD stone.  Recommend correlation with LFTs.  If further evaluation is felt warranted, MRCP may be beneficial.  Original Report Authenticated By: Cyndie Chime, M.D.   Dg Chest Port 1 View  02/03/2012  *RADIOLOGY REPORT*  Clinical Data: Respiratory failure.  PORTABLE CHEST - 1 VIEW  Comparison: Chest x-ray 02/02/2012.  Findings: An endotracheal tube is in place with tip 3.6 cm above the carina. Lung volumes are low.  There are bibasilar opacities which are favored to represent areas of subsegmental atelectasis. There is cephalization of the pulmonary vasculature and slight indistinctness of the interstitial markings suggestive of mild pulmonary edema.   Blunting of the left costophrenic sulcus is favored to be technique related, although a small left-sided pleural effusion is difficult to exclude.  Heart size is mildly enlarged (unchanged).  Mediastinal contours are grossly distorted by patient positioning and rotation.  IMPRESSION: 1.  Overall, the radiographic appearance of chest is very similar to the prior examination, with an appearance most suggestive of mild congestive heart  failure, as detailed above.  Original Report Authenticated By: Florencia Reasons, M.D.   Dg Chest Port 1 View  02/02/2012  *RADIOLOGY REPORT*  Clinical Data: Respiratory failure.  Shortness of breath and cough  PORTABLE CHEST - 1 VIEW  Comparison: 01/22/2009  Findings: ET tube tip is above the carina.  There is a there is moderate cardiac enlargement.  Pulmonary venous congestion is noted.  No pleural effusion or overt edema.  IMPRESSION:   1.  ET tube tip is above the carina. 2.  Cardiac enlargement and pulmonary venous congestion.  Original Report Authenticated By: Rosealee Albee, M.D.    Anti-infectives: Anti-infectives     Start     Dose/Rate Route Frequency Ordered Stop   02/02/12 1130   ceFAZolin (ANCEF) IVPB 2 g/50 mL premix  Status:  Discontinued        2 g 100 mL/hr over 30 Minutes Intravenous 60 min pre-op 02/02/12 1057 02/02/12 1425          Assessment/Plan: s/p Procedure(s) (LRB): LAPAROSCOPIC CHOLECYSTECTOMY (N/A)  Post op respiratory failure Ok to start on liquids once extubated Will repeat LFT's tomorrow  LOS: 2 days    Ranon Coven A 02/03/2012

## 2012-02-04 ENCOUNTER — Encounter (HOSPITAL_COMMUNITY): Payer: Self-pay | Admitting: General Surgery

## 2012-02-04 LAB — BASIC METABOLIC PANEL
BUN: 10 mg/dL (ref 6–23)
CO2: 31 mEq/L (ref 19–32)
Calcium: 8.9 mg/dL (ref 8.4–10.5)
Chloride: 100 mEq/L (ref 96–112)
Creatinine, Ser: 0.94 mg/dL (ref 0.50–1.10)
GFR calc Af Amer: 70 mL/min — ABNORMAL LOW (ref >90)

## 2012-02-04 LAB — COMPREHENSIVE METABOLIC PANEL
ALT: 95 U/L — ABNORMAL HIGH (ref 0–35)
AST: 91 U/L — ABNORMAL HIGH (ref 0–37)
Alkaline Phosphatase: 114 U/L (ref 39–117)
CO2: 27 mEq/L (ref 19–32)
Calcium: 8.7 mg/dL (ref 8.4–10.5)
Chloride: 96 mEq/L (ref 96–112)
GFR calc Af Amer: 85 mL/min — ABNORMAL LOW (ref >90)
GFR calc non Af Amer: 74 mL/min — ABNORMAL LOW (ref >90)
Glucose, Bld: 93 mg/dL (ref 70–99)
Potassium: 3.6 mEq/L (ref 3.5–5.1)
Sodium: 135 mEq/L (ref 135–145)

## 2012-02-04 LAB — CULTURE, RESPIRATORY W GRAM STAIN

## 2012-02-04 LAB — LEGIONELLA ANTIGEN, URINE: Legionella Antigen, Urine: NEGATIVE

## 2012-02-04 LAB — HEPATITIS PANEL, ACUTE: Hep B C IgM: NEGATIVE

## 2012-02-04 MED ORDER — POTASSIUM CHLORIDE CRYS ER 20 MEQ PO TBCR
40.0000 meq | EXTENDED_RELEASE_TABLET | Freq: Once | ORAL | Status: AC
  Administered 2012-02-04: 40 meq via ORAL
  Filled 2012-02-04: qty 2

## 2012-02-04 MED ORDER — METOPROLOL TARTRATE 12.5 MG HALF TABLET
12.5000 mg | ORAL_TABLET | Freq: Four times a day (QID) | ORAL | Status: DC
  Administered 2012-02-04 (×2): 12.5 mg via ORAL
  Filled 2012-02-04 (×6): qty 1

## 2012-02-04 MED ORDER — METOPROLOL TARTRATE 25 MG PO TABS
25.0000 mg | ORAL_TABLET | Freq: Two times a day (BID) | ORAL | Status: DC
  Administered 2012-02-04 – 2012-02-05 (×2): 25 mg via ORAL
  Filled 2012-02-04 (×3): qty 1

## 2012-02-04 NOTE — Progress Notes (Signed)
Patient ID: Erika [name], female   DOB: [birth year], 71 y.o.   MRN: [id number] 2 Days Post-Op  Subjective: Pt wo complaints, extubated and using IS, denies abd pain, n/v.  Tolerating liquids well.  Objective: Vital signs in last 24 hours: Temp:  [98.1 F (36.7 C)-100 F (37.8 C)] 98.7 F (37.1 C) (07/01 0741) Pulse Rate:  [79-113] 82  (07/01 0600) Resp:  [16-25] 16  (07/01 0600) BP: (105-143)/(50-96) 119/72 mmHg (07/01 0600) SpO2:  [93 %-100 %] 97 % (07/01 0600) Weight:  [323 lb 13.7 oz (146.9 kg)] 323 lb 13.7 oz (146.9 kg) (07/01 0400) Last BM Date: 02/01/12  Intake/Output from previous day: 06/30 0701 - 07/01 0700 In: 1532.1 [P.O.:360; I.V.:1168.1; IV Piggyback:4] Out: 3710 [Urine:3710] Intake/Output this shift:    PE: Abd: soft, nontender, +bs, incisions c/d/i  Lab Results:   Basename 02/03/12 0850 02/02/12 1624  WBC 9.1 9.7  HGB 11.7* 11.9*  HCT 35.3* 36.6  PLT 137* 182   BMET  Basename 02/04/12 0405 02/04/12 0012  NA 135 140  K 3.6 3.2*  CL 96 100  CO2 27 31  GLUCOSE 93 118*  BUN 8 10  CREATININE 0.80 0.94  CALCIUM 8.7 8.9   PT/INR  Basename 02/02/12 1624 02/01/12 2014  LABPROT 15.3* 13.9  INR 1.18 1.05   CMP     Component Value Date/Time   NA 135 02/04/2012 0405   K 3.6 02/04/2012 0405   CL 96 02/04/2012 0405   CO2 27 02/04/2012 0405   GLUCOSE 93 02/04/2012 0405   BUN 8 02/04/2012 0405   CREATININE 0.80 02/04/2012 0405   CREATININE 0.97 10/05/2011 1620   CALCIUM 8.7 02/04/2012 0405   PROT 7.2 02/04/2012 0405   ALBUMIN 2.7* 02/04/2012 0405   AST 91* 02/04/2012 0405   ALT 95* 02/04/2012 0405   ALKPHOS 114 02/04/2012 0405   BILITOT 0.6 02/04/2012 0405   GFRNONAA 74* 02/04/2012 0405   GFRAA 85* 02/04/2012 0405   Lipase     Component Value Date/Time   LIPASE 13 02/01/2012 1145       Studies/Results: Dg Chest Port 1 View  02/03/2012  *RADIOLOGY REPORT*  Clinical Data: Respiratory failure.  PORTABLE CHEST - 1 VIEW  Comparison: Chest x-ray 02/02/2012.  Findings: An  endotracheal tube is in place with tip 3.6 cm above the carina. Lung volumes are low.  There are bibasilar opacities which are favored to represent areas of subsegmental atelectasis. There is cephalization of the pulmonary vasculature and slight indistinctness of the interstitial markings suggestive of mild pulmonary edema.   Blunting of the left costophrenic sulcus is favored to be technique related, although a small left-sided pleural effusion is difficult to exclude.  Heart size is mildly enlarged (unchanged).  Mediastinal contours are grossly distorted by patient positioning and rotation.  IMPRESSION: 1.  Overall, the radiographic appearance of chest is very similar to the prior examination, with an appearance most suggestive of mild congestive heart failure, as detailed above.  Original Report Authenticated By: Florencia Reasons, M.D.   Dg Chest Port 1 View  02/02/2012  *RADIOLOGY REPORT*  Clinical Data: Respiratory failure.  Shortness of breath and cough  PORTABLE CHEST - 1 VIEW  Comparison: 01/22/2009  Findings: ET tube tip is above the carina.  There is a there is moderate cardiac enlargement.  Pulmonary venous congestion is noted.  No pleural effusion or overt edema.  IMPRESSION:  1.  ET tube tip is above the carina. 2.  Cardiac enlargement and  pulmonary venous congestion.  Original Report Authenticated By: Rosealee Albee, M.D.    Anti-infectives: Anti-infectives     Start     Dose/Rate Route Frequency Ordered Stop   02/02/12 1130   ceFAZolin (ANCEF) IVPB 2 g/50 mL premix  Status:  Discontinued        2 g 100 mL/hr over 30 Minutes Intravenous 60 min pre-op 02/02/12 1057 02/02/12 1425           Assessment/Plan  1.  POD#2- lap chole: had to be reintubated after surgery but now extubated and doing well, using IS frequently, denies n/v, abd pain.  Will advance diet today, d/c foley, ambulate in halls, transfer to floor.  Hopefully home soon   LOS: 3 days    Schyler Butikofer,  Evelynne Spiers 02/04/2012

## 2012-02-04 NOTE — Progress Notes (Signed)
Name: Erika [name] MRN: [id number] DOB: 07/11/1941    LOS: 3 DAt of admit 02/01/2012   Referring Provider:  Violeta Gelinas Reason for Referral:  Post-op respiratory failure  PULMONARY / CRITICAL CARE MEDICINE  HPI:  71 yo female admitted 02/01/2012 abdominal pain and increased LFT's from acute cholecystitis.  She had laparoscopic cholecystectomy 6/29.  She remained on vent post-op and PCCM consulted.   Events Since Admission: 6/29 reintubation in PACU  Current Status: Extubated 6/30, eating breakfast  Denies chest pain, dyspnea.  Reports feeling hungry. Episode of SVT overnight -lopressorpo Diuresed well with lasix Vital Signs: Temp:  [98.1 F (36.7 C)-100 F (37.8 C)] 98.7 F (37.1 C) (07/01 0741) Pulse Rate:  [79-112] 80  (07/01 0800) Resp:  [16-25] 19  (07/01 0800) BP: (105-143)/(50-96) 135/83 mmHg (07/01 0800) SpO2:  [93 %-100 %] 96 % (07/01 0800) Weight:  [146.9 kg (323 lb 13.7 oz)] 146.9 kg (323 lb 13.7 oz) (07/01 0400)  Physical Examination: General:  No distress Neuro:  Alert, follows commands, moves all extremities HEENT:  On [place] Neck:  No LAN Cardiovascular:  s1s2 regular, no murmur Lungs:  Basilar rales, no wheeze Abdomen:  Non tender Musculoskeletal:  SCD in place Skin:  No rashes   ASSESSMENT AND PLAN  PULMONARY    ETT:  6/29 x 2>>6/30  A: Post-op respiratory failure likely from residual effects of anesthesia in setting of morbid obesity, and hx of asthma.  No reported hx of OSA. P:   extubated 6/30 Restart advair 250/50 one puff bid Albuterol PRN Evaluation for sleep disordered breathing as outpt   CARDIOVASCULAR  Lines:  None  A: Hx of HTN with chronic grade 1 diastolic dysfx SVT P:  Resume  ASA 81 mg qd when able  Lopressor started 7/1 ffor SVT - this can take the place of amlodepin   RENAL  Lab 02/04/12 0405 02/04/12 0012 02/03/12 0850 02/02/12 1624 02/02/12 0621  NA 135 140 137 135 136  K 3.6 3.2* -- -- --  CL 96 100 101 100  100  CO2 27 31 24 25 27   BUN 8 10 9 15 14   CREATININE 0.80 0.94 0.82 1.10 1.10  CALCIUM 8.7 8.9 8.6 8.7 8.7  MG -- 2.0 -- 1.8 --  PHOS -- -- -- 3.0 --   Intake/Output      06/30 0701 - 07/01 0700 07/01 0701 - 07/02 0700   P.O. 360    I.V. (mL/kg) 1168.1 (8) 50 (0.3)   IV Piggyback 4    Total Intake(mL/kg) 1532.1 (10.4) 50 (0.3)   Urine (mL/kg/hr) 3710 (1.1) 20   Total Output 3710 20   Net -2177.9 +30         Foley:  6/29>>  A:  Dc foley   GASTROINTESTINAL  Lab 02/04/12 0405 02/02/12 1624 02/02/12 0621 02/01/12 1145  AST 91* 155* 138* 183*  ALT 95* 151* 142* 83*  ALKPHOS 114 136* 128* 160*  BILITOT 0.6 0.7 0.7 0.8  PROT 7.2 7.4 7.1 8.9*  ALBUMIN 2.7* 3.0* 2.9* 3.7    A: Increased LFT 2nd to acute on chronic cholecystitis s/p lap chole. P:   Post-op care and diet advancement per surgery F/u LFT's per surgery    INFECTIOUS  Lab 02/03/12 0850 02/02/12 1642 02/02/12 1624 02/02/12 0621 02/01/12 1145  WBC 9.1 -- 9.7 9.8 9.4  PROCALCITON -- 2.48 -- -- --   Cultures: Blood 6/29>>ng Urine 6/29>>ng Sputum 6/29>>ng  Antibiotics: None  A:   No  active issues   NEUROLOGIC  A:  Pain control. P:   PRN fentanyl   BEST PRACTICE / DISPOSITION  DVT Px: SCD GI Px:  Protonix   PCCM to sign off, pl make appt with dr Craige Cotta for outpt FU in 2-4 wk s after dc  Cyril Mourning MD. [facility] [place] [facility] & Critical care Pager [phone] If no response call [phone]     02/04/2012, 9:09 AM       .

## 2012-02-04 NOTE — Progress Notes (Signed)
Patient transferred to [facility] room 7 via wheelchair. Noted SOB with activity. Lap sites unremarkable. Oriented to room. Call bell in reach.

## 2012-02-04 NOTE — Progress Notes (Signed)
The patient looks great. Very pleasant.  Transfer orders written by PA. Okay to transfer and should be able to go home soon.  Marta Lamas. Gae Bon, MD, FACS [phone] [facility] Surgery

## 2012-02-04 NOTE — Progress Notes (Signed)
eLink Physician-Brief Progress Note Patient Name: Erika [name] DOB: [birth year] MRN: [id number]  Date of Service  02/04/2012   HPI/Events of Note  Call from nurse reporting patient experiencing intermittent episodes of SVT.  Remains HD stable.  Current HR of 112 with BP 139/68.  Has been actively diuresed.   eICU Interventions  Plan: Check BMET/Mg Start lopressor 12.5 mg q6 hours   Intervention Category Intermediate Interventions: Arrhythmia - evaluation and management  Amel Kitch 02/04/2012, 12:05 AM

## 2012-02-05 MED ORDER — OXYCODONE HCL 5 MG PO TABS: 5.0000 mg | ORAL_TABLET | ORAL | Status: AC | PRN

## 2012-02-05 MED ORDER — ENOXAPARIN SODIUM 80 MG/0.8ML ~~LOC~~ SOLN
75.0000 mg | SUBCUTANEOUS | Status: DC
  Filled 2012-02-05: qty 0.8

## 2012-02-05 NOTE — Progress Notes (Signed)
Patient having no respiratory distress.  I believe that she is okay and can be discharged without further studies.  Erika [name]. Gae Bon, MD, FACS [phone] [facility] Surgery

## 2012-02-05 NOTE — Discharge Summary (Signed)
Physician Discharge Summary  Patient ID: ELEAH LAHAIE MRN: [id number] DOB/AGE: 10-07-1940 71 y.o.  Admit date: 02/01/2012 Discharge date: 02/05/2012  Discharge Diagnoses Patient Active Problem List   Diagnosis Date Noted  . Acute postoperative respiratory insufficiency 02/02/2012    Class: Acute  . Acute respiratory failure with hypoxia 02/02/2012    Class: Acute  . Cholelithiasis with obstruction 02/01/2012  . Left knee pain 01/29/2012  . OA (osteoarthritis of spine) 10/18/2011  . PAC (premature atrial contraction) 05/16/2011  . Preventative health care 04/25/2011  . Irregular heart rhythm 04/25/2011  . EOSINOPHILIA 04/03/2009  . TACHYCARDIA 02/26/2008  . ASTHMA 02/10/2008  . HYPERLIPIDEMIA 08/04/2007  . MEDIASTINAL LYMPHADENOPATHY 08/04/2007  . STASIS DERMATITIS 02/20/2007  . SHINGLES, RECURRENT 08/29/2006  . OBESITY 08/29/2006  . BREAST CYST, BENIGN 08/29/2006  . HYPERTENSION 08/27/2006  . TB OF VERTEBRAL COLUMN-CULT DX 08/01/1999    Consultants 1. Pulmonary / Critical Care Medicine asked to manage care in ICU setting  2. Respiratory Therapy   Procedures 1. Laparoscopic Cholecystectomy by Dr. Violeta Gelinas, MD on 02/02/2012 2. Extubation by Elvina Sidle. Mayford Knife, RRT on 02/03/2012  HPI: Patient is a 71 year old female a history of hypertension, obesity, and hyperlipidemia that presented to the ED with severe epigastric and right upper quadrant abdominal pain that woke her up at 4am. The pain was 10 out of 10 in severity, nonradiating, and achy in nature. The pain was constant with intermittent accentuation. The pain was associated with nausea and non-bloody, non-billious vomiting x3. She denied other symptoms or recurrence. She denied any previous abdominal surgery. On physical exam, she was afebrile with RUQ and epigastric tenderness and a positive Murphy's sign. EKG, CMP, CBC, Lipase, and Troponin were ordered. EKG showed multiple PVCs. Labs showed elevated LFT's, no  leukocytosis, normal lipase, and normal troponin levels. U/S showed small gallstones, mild intrahepatic and extrahepatic biliary ductal dilatation, and a common bile duct obstructing stone could not be excluded.   Hospital Course: The patient was made NPO and admitted to surgery. She was brought to the OR by Dr. Violeta Gelinas on 02/02/2012 for Laparoscopy Cholecystectomy. The patient tolerated the procedure well. She was re-intubated in the OR due to small tidal volumes and dropping O2 saturation when extubation was attempted. Patient was sent to ICU post-operatively and care was managed by PCCM. She was extubated on 02/03/2012 and diet was advanced as tolerated. Her recovery was otherwise uneventful with no fevers, signs of wound infection, nausea, vomiting, or other complaints. She will be discharged home and instructed to follow up with Dr. Janee Morn in 2-3 weeks.    Medication List  As of 02/05/2012 11:47 AM   ASK your doctor about these medications         albuterol 108 (90 BASE) MCG/ACT inhaler   Commonly known as: PROVENTIL HFA;VENTOLIN HFA   Inhale 2 puffs into the lungs every 6 (six) hours as needed for wheezing.      amLODipine 10 MG tablet   Commonly known as: NORVASC   Take 1 tablet (10 mg total) by mouth daily.      aspirin EC 81 MG tablet   Take 1 tablet (81 mg total) by mouth daily.      Fluticasone-Salmeterol 250-50 MCG/DOSE Aepb   Commonly known as: ADVAIR   Inhale 1 puff into the lungs 2 (two) times daily.      omeprazole 20 MG capsule   Commonly known as: PRILOSEC   Take 1 capsule (20 mg total) by mouth daily.  traMADol 50 MG tablet   Commonly known as: ULTRAM   Take 1-2 tablets (50-100 mg total) by mouth every 6 (six) hours as needed for pain.               Signed: Earley Favor 02/05/2012 11:56 AM

## 2012-02-05 NOTE — Progress Notes (Signed)
Patient ID: Erika [name], female   DOB: [birth year], 71 y.o.   MRN: [id number] 3 Days Post-Op  Subjective: No complaints. Extubated and using IS. Pulls 500-745mL on IS. Ambulating without difficulty. Denies abd pain, NV. Tolerating soft diet well.  Objective: Vital signs in last 24 hours: Temp:  [97.1 F (36.2 C)-98.9 F (37.2 C)] 98.9 F (37.2 C) (07/02 0520) Pulse Rate:  [80-110] 86  (07/02 0520) Resp:  [19-21] 21  (07/02 0520) BP: (103-135)/(55-83) 130/77 mmHg (07/02 0520) SpO2:  [93 %-99 %] 93 % (07/02 0520) Weight:  [150.6 kg (332 lb 0.2 oz)] 150.6 kg (332 lb 0.2 oz) (07/02 0520) Last BM Date: 02/04/12  Intake/Output from previous day: 07/01 0701 - 07/02 0700 In: 200 [P.O.:150; I.V.:50] Out: 530 [Urine:530] Intake/Output this shift:    PE: General: alert, no acute distress Heart: regular rate and rhythm Abd: + BS, soft, mild tenderness over incisions, incisions clean dry and intact  Lab Results:   Basename 02/03/12 0850 02/02/12 1624  WBC 9.1 9.7  HGB 11.7* 11.9*  HCT 35.3* 36.6  PLT 137* 182   BMET  Basename 02/04/12 0405 02/04/12 0012  NA 135 140  K 3.6 3.2*  CL 96 100  CO2 27 31  GLUCOSE 93 118*  BUN 8 10  CREATININE 0.80 0.94  CALCIUM 8.7 8.9   PT/INR  Basename 02/02/12 1624  LABPROT 15.3*  INR 1.18   CMP     Component Value Date/Time   NA 135 02/04/2012 0405   K 3.6 02/04/2012 0405   CL 96 02/04/2012 0405   CO2 27 02/04/2012 0405   GLUCOSE 93 02/04/2012 0405   BUN 8 02/04/2012 0405   CREATININE 0.80 02/04/2012 0405   CREATININE 0.97 10/05/2011 1620   CALCIUM 8.7 02/04/2012 0405   PROT 7.2 02/04/2012 0405   ALBUMIN 2.7* 02/04/2012 0405   AST 91* 02/04/2012 0405   ALT 95* 02/04/2012 0405   ALKPHOS 114 02/04/2012 0405   BILITOT 0.6 02/04/2012 0405   GFRNONAA 74* 02/04/2012 0405   GFRAA 85* 02/04/2012 0405   Lipase     Component Value Date/Time   LIPASE 13 02/01/2012 1145       Studies/Results: No results found.  Anti-infectives: Anti-infectives     Start      Dose/Rate Route Frequency Ordered Stop   02/02/12 1130   ceFAZolin (ANCEF) IVPB 2 g/50 mL premix  Status:  Discontinued        2 g 100 mL/hr over 30 Minutes Intravenous 60 min pre-op 02/02/12 1057 02/02/12 1425           Assessment/Plan S/p Laparoscopic cholecystectomy -- Post-op day 3 -- advance diet today, continue to mobilize and use IS frequently. Hopefully home today or tomorrow provided low inspiratory volume is baseline. Consider repeat CXR before discharge to r/o acute pulmonary process.   LOS: 4 days    Glenda Chroman PA-S 02/05/2012 8:10 AM

## 2012-02-05 NOTE — Discharge Summary (Signed)
Okay to go home.  Patient looks good.  Erika [name]. Gae Bon, MD, FACS [phone] [facility]

## 2012-02-09 LAB — CULTURE, BLOOD (ROUTINE X 2)

## 2012-02-27 ENCOUNTER — Ambulatory Visit (INDEPENDENT_AMBULATORY_CARE_PROVIDER_SITE_OTHER): Payer: Medicare Other | Admitting: General Surgery

## 2012-02-27 ENCOUNTER — Encounter (INDEPENDENT_AMBULATORY_CARE_PROVIDER_SITE_OTHER): Payer: Self-pay | Admitting: General Surgery

## 2012-02-27 VITALS — BP 146/88 | HR 70 | Temp 97.8°F | Resp 16 | Ht 67.0 in | Wt 328.2 lb

## 2012-02-27 DIAGNOSIS — Z9049 Acquired absence of other specified parts of digestive tract: Secondary | ICD-10-CM | POA: Insufficient documentation

## 2012-02-27 DIAGNOSIS — Z9889 Other specified postprocedural states: Secondary | ICD-10-CM

## 2012-02-27 NOTE — Progress Notes (Signed)
Subjective:     Patient ID: Erika [name], female   DOB: March 11, 1941, 71 y.o.   MRN: [id number]  HPI Patient is status post laparoscopic cholecystectomy. She is pain free. She has no complaints. Bowels are moving well.  Review of Systems     Objective:   Physical Exam Abdomen is soft and nontender. All 4 incisions are well-healed without signs of infection.    Assessment:     Doing well status post laparoscopic cholecystectomy    Plan:     Avoid heavy lifting for a total of one month after surgery. I cleared her at this point to swim.  She likes to walk in the pool for exercise.  We will see her back as needed.

## 2012-03-27 ENCOUNTER — Other Ambulatory Visit: Payer: Self-pay | Admitting: *Deleted

## 2012-03-27 MED ORDER — FLUTICASONE-SALMETEROL 250-50 MCG/DOSE IN AEPB: 1.0000 | INHALATION_SPRAY | Freq: Two times a day (BID) | RESPIRATORY_TRACT | Status: DC

## 2012-07-09 ENCOUNTER — Other Ambulatory Visit: Payer: Self-pay | Admitting: *Deleted

## 2012-07-11 MED ORDER — OMEPRAZOLE 20 MG PO CPDR: 20.0000 mg | DELAYED_RELEASE_CAPSULE | Freq: Every day | ORAL | Status: DC

## 2012-07-11 NOTE — Telephone Encounter (Signed)
Pt has called again for refill.

## 2012-09-08 ENCOUNTER — Encounter (HOSPITAL_COMMUNITY): Payer: Self-pay | Admitting: Emergency Medicine

## 2012-09-08 ENCOUNTER — Emergency Department (HOSPITAL_COMMUNITY)
Admission: EM | Admit: 2012-09-08 | Discharge: 2012-09-09 | Disposition: A | Discharge status: Home / Self Care | Payer: Medicare Other | Attending: Emergency Medicine | Admitting: Emergency Medicine

## 2012-09-08 DIAGNOSIS — Z8659 Personal history of other mental and behavioral disorders: Secondary | ICD-10-CM | POA: Insufficient documentation

## 2012-09-08 DIAGNOSIS — IMO0002 Reserved for concepts with insufficient information to code with codable children: Secondary | ICD-10-CM | POA: Insufficient documentation

## 2012-09-08 DIAGNOSIS — I10 Essential (primary) hypertension: Secondary | ICD-10-CM | POA: Insufficient documentation

## 2012-09-08 DIAGNOSIS — Z9089 Acquired absence of other organs: Secondary | ICD-10-CM | POA: Insufficient documentation

## 2012-09-08 DIAGNOSIS — E669 Obesity, unspecified: Secondary | ICD-10-CM | POA: Insufficient documentation

## 2012-09-08 DIAGNOSIS — R112 Nausea with vomiting, unspecified: Secondary | ICD-10-CM | POA: Insufficient documentation

## 2012-09-08 DIAGNOSIS — J45909 Unspecified asthma, uncomplicated: Secondary | ICD-10-CM | POA: Insufficient documentation

## 2012-09-08 DIAGNOSIS — Z8639 Personal history of other endocrine, nutritional and metabolic disease: Secondary | ICD-10-CM | POA: Insufficient documentation

## 2012-09-08 DIAGNOSIS — Z8611 Personal history of tuberculosis: Secondary | ICD-10-CM | POA: Insufficient documentation

## 2012-09-08 DIAGNOSIS — R109 Unspecified abdominal pain: Secondary | ICD-10-CM

## 2012-09-08 DIAGNOSIS — Z8742 Personal history of other diseases of the female genital tract: Secondary | ICD-10-CM | POA: Insufficient documentation

## 2012-09-08 DIAGNOSIS — Z862 Personal history of diseases of the blood and blood-forming organs and certain disorders involving the immune mechanism: Secondary | ICD-10-CM | POA: Insufficient documentation

## 2012-09-08 DIAGNOSIS — R195 Other fecal abnormalities: Secondary | ICD-10-CM

## 2012-09-08 DIAGNOSIS — Z8679 Personal history of other diseases of the circulatory system: Secondary | ICD-10-CM | POA: Insufficient documentation

## 2012-09-08 DIAGNOSIS — R1033 Periumbilical pain: Secondary | ICD-10-CM | POA: Insufficient documentation

## 2012-09-08 DIAGNOSIS — Z8739 Personal history of other diseases of the musculoskeletal system and connective tissue: Secondary | ICD-10-CM | POA: Insufficient documentation

## 2012-09-08 DIAGNOSIS — Z8619 Personal history of other infectious and parasitic diseases: Secondary | ICD-10-CM | POA: Insufficient documentation

## 2012-09-08 DIAGNOSIS — R197 Diarrhea, unspecified: Secondary | ICD-10-CM | POA: Insufficient documentation

## 2012-09-08 DIAGNOSIS — Z79899 Other long term (current) drug therapy: Secondary | ICD-10-CM | POA: Insufficient documentation

## 2012-09-08 MED ORDER — ONDANSETRON 4 MG PO TBDP
4.0000 mg | ORAL_TABLET | Freq: Once | ORAL | Status: AC
  Administered 2012-09-08: 4 mg via ORAL
  Filled 2012-09-08: qty 1

## 2012-09-08 NOTE — ED Notes (Signed)
Pt states her stomach has hurt off and on since Sunday evening  Pt states she started having vomiting this afternoon  Pt states she has had a little diarrhea as well   Pt states the pain is mainly in the middle around her umbilicus

## 2012-09-09 LAB — CBC WITH DIFFERENTIAL/PLATELET
Hemoglobin: 13.5 g/dL (ref 12.0–15.0)
Lymphocytes Relative: 20 % (ref 12–46)
Lymphs Abs: 1 10*3/uL (ref 0.7–4.0)
MCH: 29.6 pg (ref 26.0–34.0)
Monocytes Relative: 9 % (ref 3–12)
Neutro Abs: 3.5 10*3/uL (ref 1.7–7.7)
Neutrophils Relative %: 70 % (ref 43–77)
RBC: 4.56 MIL/uL (ref 3.87–5.11)
WBC: 4.9 10*3/uL (ref 4.0–10.5)

## 2012-09-09 LAB — URINALYSIS, ROUTINE W REFLEX MICROSCOPIC
Bilirubin Urine: NEGATIVE
Specific Gravity, Urine: 1.017 (ref 1.005–1.030)
pH: 6.5 (ref 5.0–8.0)

## 2012-09-09 LAB — COMPREHENSIVE METABOLIC PANEL
AST: 33 U/L (ref 0–37)
BUN: 10 mg/dL (ref 6–23)
CO2: 27 mEq/L (ref 19–32)
Calcium: 8.6 mg/dL (ref 8.4–10.5)
Creatinine, Ser: 0.77 mg/dL (ref 0.50–1.10)
GFR calc non Af Amer: 84 mL/min — ABNORMAL LOW (ref >90)

## 2012-09-09 LAB — URINE MICROSCOPIC-ADD ON

## 2012-09-09 LAB — LIPASE, BLOOD: Lipase: 12 U/L (ref 11–59)

## 2012-09-09 MED ORDER — HYDROCODONE-ACETAMINOPHEN 5-325 MG PO TABS
1.0000 | ORAL_TABLET | Freq: Once | ORAL | Status: AC
  Administered 2012-09-09: 1 via ORAL
  Filled 2012-09-09: qty 1

## 2012-09-09 MED ORDER — IBUPROFEN 200 MG PO TABS
600.0000 mg | ORAL_TABLET | Freq: Once | ORAL | Status: AC
  Administered 2012-09-09: 600 mg via ORAL
  Filled 2012-09-09: qty 3

## 2012-09-09 MED ORDER — ONDANSETRON HCL 4 MG PO TABS: 4.0000 mg | ORAL_TABLET | Freq: Four times a day (QID) | ORAL | Status: DC

## 2012-09-09 NOTE — ED Provider Notes (Signed)
History     CSN: [id number]  Arrival date & time 09/08/12  2218   First MD Initiated Contact with Patient 09/08/12 2341      Chief Complaint  Patient presents with  . Abdominal Pain  . Emesis    (Consider location/radiation/quality/duration/timing/severity/associated sxs/prior treatment) HPIMary H Farler is a 72 y.o. female no pertinent medical history except for cholecystectomy in the past, she presents with abdominal pain which is periumbilical, it is a gnawing sensation, associated with nausea vomiting, she says it's similar to the pain she had of her gallbladder, is 5/10 and colicky vomiting makes her feel better. She says she vomited multiple times since 9:00. No diarrhea. No fevers, no chills, no dizziness, no lightheadedness, no myalgias, no arthralgias, no rash, no dysuria or frequency.   Past Medical History  Diagnosis Date  . Hypertension   . Tuberculosis     active TB treated in 2002, hx of paraspinal lumbar TB,   . Obesity     BMI 54  . Menopause   . Breast cyst     Excesion with FNA, begnin in 2004.   [name] Shingles   . Asthma   . SVT (supraventricular tachycardia) June 2009    one run while hospitalized  . Back pain     status post surgery 2002  . Stasis dermatitis     W/ LE edema, prviously on lasix now on mazxide.   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  . Lymphadenopathy     Of the mediastinum, Right side CXR 2008, not read on 2010 cxr.   [name] History of shingles     Recurrent with post herpetic neuralgia.   . Degenerative joint disease of spine     Imaging 2005,  Degenerative hypertrophic facet arthritis changes L4-5 and L5-S1..   . Anemia     with menses    Past Surgical History  Procedure Date  . Hemilaminectomy of l4, l5 and s1 decompression of tumor in epidural space 2000  . Breast cyst biopsy   . Cholecystectomy 02/02/2012    Procedure: LAPAROSCOPIC CHOLECYSTECTOMY;  Surgeon: Liz Malady, MD;  Location: [facility] OR;  Service: General;   Laterality: N/A;    Family History  Problem Relation Age of Onset  . Stroke Mother     at young age  . Stroke Father     in 54's  . Heart attack Father   . Stroke Brother   . Stroke Brother     History  Substance Use Topics  . Smoking status: Never Smoker   . Smokeless tobacco: Never Used  . Alcohol Use: No    OB History    Grav Para Term Preterm Abortions TAB SAB Ect Mult Living                  Review of Systems At least 10pt or greater review of systems completed and are negative except where specified in the HPI.  Allergies  Prednisone  Home Medications   Current Outpatient Rx  Name  Route  Sig  Dispense  Refill  . ALBUTEROL SULFATE HFA 108 (90 BASE) MCG/ACT IN AERS   Inhalation   Inhale 2 puffs into the lungs every 6 (six) hours as needed for wheezing.   8.5 g   6   . AMLODIPINE BESYLATE 10 MG PO TABS   Oral   Take 1 tablet (10 mg total) by mouth daily.   90 tablet   4   .  FLUTICASONE-SALMETEROL 250-50 MCG/DOSE IN AEPB   Inhalation   Inhale 1 puff into the lungs 2 (two) times daily.   60 each   3   . OMEPRAZOLE 20 MG PO CPDR   Oral   Take 1 capsule (20 mg total) by mouth daily.   90 capsule   0   . TRAMADOL HCL 50 MG PO TABS   Oral   Take 1-2 tablets (50-100 mg total) by mouth every 6 (six) hours as needed for pain.   90 tablet   3     BP 148/83  Pulse 100  Temp 98.3 F (36.8 C) (Oral)  Resp 22  Ht 5\' 6"  (1.676 m)  Wt 323 lb (146.512 kg)  BMI 52.13 kg/m2  SpO2 95%  Physical Exam  Nursing notes reviewed.  Electronic medical record reviewed. VITAL SIGNS:   Filed Vitals:   09/08/12 2223 09/09/12 0047 09/09/12 0328  BP: 148/83 143/63 136/64  Pulse: 100 90 90  Temp: 98.3 F (36.8 C)  98.7 F (37.1 C)  TempSrc: Oral  Oral  Resp: 22 16 20   Height: 5\' 6"  (1.676 m)    Weight: 323 lb (146.512 kg)    SpO2: 95% 95% 98%   CONSTITUTIONAL: Awake, oriented, appears non-toxic HENT: Atraumatic, normocephalic, oral mucosa pink and  moist, airway patent. Nares patent without drainage. External ears normal. EYES: Conjunctiva clear, EOMI, PERRLA NECK: Trachea midline, non-tender, supple CARDIOVASCULAR: Normal heart rate, Normal rhythm, No murmurs, rubs, gallops PULMONARY/CHEST: Clear to auscultation, no rhonchi, wheezes, or rales. Symmetrical breath sounds. Non-tender. ABDOMINAL: Non-distended, soft, non-tender - no rebound or guarding.  BS normal. NEUROLOGIC: Non-focal, moving all four extremities, no gross sensory or motor deficits. EXTREMITIES: No clubbing, cyanosis, or edema SKIN: Warm, Dry, No erythema, No rash  ED Course  Procedures (including critical care time)  Labs Reviewed  URINALYSIS, ROUTINE W REFLEX MICROSCOPIC - Abnormal; Notable for the following:    Hgb urine dipstick SMALL (*)     Ketones, ur TRACE (*)     All other components within normal limits  COMPREHENSIVE METABOLIC PANEL - Abnormal; Notable for the following:    Sodium 129 (*)     Potassium 3.0 (*)     Chloride 93 (*)     Albumin 3.0 (*)     GFR calc non Af Amer 84 (*)     All other components within normal limits  CBC WITH DIFFERENTIAL  LIPASE, BLOOD  URINE MICROSCOPIC-ADD ON  LAB REPORT - SCANNED   No results found.   1. Nausea and vomiting   2. Abdominal pain   3. Loose stools      Medications  ondansetron (ZOFRAN) 4 MG tablet (not administered)  ondansetron (ZOFRAN-ODT) disintegrating tablet 4 mg (4 mg Oral Given 09/08/12 2344)  ibuprofen (ADVIL,MOTRIN) tablet 600 mg (600 mg Oral Given 09/09/12 0025)  HYDROcodone-acetaminophen (NORCO/VICODIN) 5-325 MG per tablet 1 tablet (1 tablet Oral Given 09/09/12 0305)     MDM  SARIN COMUNALE is a 72 y.o. female presenting with nausea vomiting and periumbilical pain. Patient still has her appendix however do not think this is appendicitis. She is afebrile, nontoxic, with a benign abdominal exam. Patient does have some gnawing pain center stomach this could be related to nausea vomiting  and acute gastroenteritis she has had some loose stools. We'll treat the patient with oral Zofran as well as some pain medicine, she will also be encouraged to drink by mouth fluids while here in the ED.  Patient  may be mildly dehydrated, she can rehydrate orally as she is tolerating fluids and has not vomited since receiving Zofran. We'll discharge her home stable and good condition with Zofran prescription and instructions to return to the emergency department immediately should any symptoms worsen or should any new symptoms arise. She followup with the primary care physician as needed.         Jones Skene, MD 09/10/12 2337

## 2012-10-17 ENCOUNTER — Other Ambulatory Visit: Payer: Self-pay | Admitting: Internal Medicine

## 2012-10-17 ENCOUNTER — Other Ambulatory Visit: Payer: Self-pay | Admitting: *Deleted

## 2012-10-17 MED ORDER — AMLODIPINE BESYLATE 10 MG PO TABS: 10.0000 mg | ORAL_TABLET | Freq: Every day | ORAL | Status: DC

## 2012-10-17 MED ORDER — TRAMADOL HCL 50 MG PO TABS: 50.0000 mg | ORAL_TABLET | Freq: Four times a day (QID) | ORAL | Status: DC | PRN

## 2012-10-17 NOTE — Telephone Encounter (Signed)
Flag sent to front desk pool.

## 2012-12-22 ENCOUNTER — Other Ambulatory Visit: Payer: Self-pay | Admitting: *Deleted

## 2012-12-22 MED ORDER — TRAMADOL HCL 50 MG PO TABS: 50.0000 mg | ORAL_TABLET | Freq: Four times a day (QID) | ORAL | Status: DC | PRN

## 2012-12-22 NOTE — Telephone Encounter (Signed)
Last refill, noted that she needs an appointment.  Will give a 1/2 refill, but needs to be seen.  Has not been seen in 11 months, supposed to follow in 3 months based on last appointment.  Can be seen in Res clinic if needed.   thanks

## 2012-12-22 NOTE — Telephone Encounter (Signed)
Flag sent to front desk pool for appt. 

## 2012-12-26 ENCOUNTER — Encounter: Payer: Self-pay | Admitting: Internal Medicine

## 2012-12-26 ENCOUNTER — Other Ambulatory Visit: Payer: Self-pay | Admitting: *Deleted

## 2012-12-30 MED ORDER — AMLODIPINE BESYLATE 10 MG PO TABS: 10.0000 mg | ORAL_TABLET | Freq: Every day | ORAL | Status: DC

## 2013-01-19 ENCOUNTER — Other Ambulatory Visit: Payer: Self-pay | Admitting: *Deleted

## 2013-01-23 ENCOUNTER — Other Ambulatory Visit: Payer: Self-pay | Admitting: *Deleted

## 2013-01-23 NOTE — Telephone Encounter (Signed)
Dr Criselda Peaches, pt did when she received the letter about scheduling appts, call and scheduled, chilon scheduled her the first available with you, in July, she states she will keep that appt and f/u more often with you, will you fill her meds for 1 month?

## 2013-01-25 ENCOUNTER — Telehealth: Payer: Self-pay | Admitting: Internal Medicine

## 2013-01-25 MED ORDER — ALBUTEROL SULFATE HFA 108 (90 BASE) MCG/ACT IN AERS: 2.0000 | INHALATION_SPRAY | Freq: Four times a day (QID) | RESPIRATORY_TRACT | Status: DC | PRN

## 2013-01-25 MED ORDER — FLUTICASONE-SALMETEROL 250-50 MCG/DOSE IN AEPB: 1.0000 | INHALATION_SPRAY | Freq: Two times a day (BID) | RESPIRATORY_TRACT | Status: DC

## 2013-01-25 NOTE — Telephone Encounter (Signed)
  INTERNAL MEDICINE RESIDENCY PROGRAM After-Hours Telephone Call    Reason for call:   I received a call from Erika [name] at 8:20 PM, 01/25/2013 indicating that she is out of her inhalers (albuterol & advair).  She tried to call on Friday, but has not heard anything, and has been a bit more short of breath today.      Pertinent Data:   Erika [name] is a 72 yo F with history of asthma, last seen in clinic on 01/29/12.  At that time, she was taking her advair daily, and albuterol as needed and doing well with her asthma.  She has an appt in clinic for routine follow up on 02/09/13 which she is aware of.    Assessment / Plan / Recommendations:   I have sent refills of her advair & [facility] [address] at [address] of [address] per patient's request.  Her usual pharmacy is [facility] on [address], but patient tells me that pharmacy is closed today.  She was encouraged to attend her follow up, which she agrees to.      Erika Heman, MD   01/25/2013, 8:20 PM

## 2013-01-26 MED ORDER — AMLODIPINE BESYLATE 10 MG PO TABS: 10.0000 mg | ORAL_TABLET | Freq: Every day | ORAL | Status: DC

## 2013-01-26 MED ORDER — TRAMADOL HCL 50 MG PO TABS: 50.0000 mg | ORAL_TABLET | Freq: Four times a day (QID) | ORAL | Status: DC | PRN

## 2013-01-26 NOTE — Telephone Encounter (Signed)
Will do, but will definitely need to see her in July!  Thanks.

## 2013-02-02 ENCOUNTER — Encounter: Payer: Medicare Other | Admitting: Internal Medicine

## 2013-02-03 ENCOUNTER — Other Ambulatory Visit: Payer: Self-pay | Admitting: Internal Medicine

## 2013-02-03 NOTE — Telephone Encounter (Signed)
Her breathing medications were already [facility] per the patients request (please see Dr. Aline August note).  I will fill them X 1, but am not sure why she didn't pick up the other prescriptions.  Her other medications will not be refilled until seen as it has been well over a year since she has been seen by one of [place].  Thanks.

## 2013-02-09 ENCOUNTER — Encounter: Payer: Self-pay | Admitting: Internal Medicine

## 2013-02-09 ENCOUNTER — Ambulatory Visit (HOSPITAL_COMMUNITY)
Admission: RE | Admit: 2013-02-09 | Discharge: 2013-02-09 | Disposition: A | Discharge status: Home / Self Care | Payer: Medicare Other | Source: Ambulatory Visit | Attending: Internal Medicine | Admitting: Internal Medicine

## 2013-02-09 ENCOUNTER — Ambulatory Visit (INDEPENDENT_AMBULATORY_CARE_PROVIDER_SITE_OTHER): Payer: Medicare Other | Admitting: Internal Medicine

## 2013-02-09 VITALS — BP 130/80 | HR 90 | Temp 97.8°F | Ht 66.0 in | Wt 324.9 lb

## 2013-02-09 DIAGNOSIS — M25562 Pain in left knee: Secondary | ICD-10-CM

## 2013-02-09 DIAGNOSIS — M25569 Pain in unspecified knee: Secondary | ICD-10-CM

## 2013-02-09 DIAGNOSIS — M479 Spondylosis, unspecified: Secondary | ICD-10-CM

## 2013-02-09 DIAGNOSIS — E785 Hyperlipidemia, unspecified: Secondary | ICD-10-CM

## 2013-02-09 DIAGNOSIS — L293 Anogenital pruritus, unspecified: Secondary | ICD-10-CM

## 2013-02-09 DIAGNOSIS — I499 Cardiac arrhythmia, unspecified: Secondary | ICD-10-CM

## 2013-02-09 DIAGNOSIS — J45909 Unspecified asthma, uncomplicated: Secondary | ICD-10-CM

## 2013-02-09 DIAGNOSIS — M1710 Unilateral primary osteoarthritis, unspecified knee: Secondary | ICD-10-CM | POA: Insufficient documentation

## 2013-02-09 DIAGNOSIS — N898 Other specified noninflammatory disorders of vagina: Secondary | ICD-10-CM | POA: Insufficient documentation

## 2013-02-09 DIAGNOSIS — K219 Gastro-esophageal reflux disease without esophagitis: Secondary | ICD-10-CM

## 2013-02-09 DIAGNOSIS — I10 Essential (primary) hypertension: Secondary | ICD-10-CM

## 2013-02-09 DIAGNOSIS — Z0000 Encounter for general adult medical examination without abnormal findings: Secondary | ICD-10-CM

## 2013-02-09 LAB — COMPLETE METABOLIC PANEL WITH GFR
CO2: 29 mEq/L (ref 19–32)
Calcium: 9.3 mg/dL (ref 8.4–10.5)
Chloride: 103 mEq/L (ref 96–112)
Creat: 0.93 mg/dL (ref 0.50–1.10)
GFR, Est African American: 72 mL/min
GFR, Est Non African American: 62 mL/min
Glucose, Bld: 88 mg/dL (ref 70–99)
Total Bilirubin: 0.4 mg/dL (ref 0.3–1.2)

## 2013-02-09 LAB — LIPID PANEL
Cholesterol: 217 mg/dL — ABNORMAL HIGH (ref 0–200)
HDL: 73 mg/dL (ref >39)
Triglycerides: 66 mg/dL (ref <150)

## 2013-02-09 MED ORDER — OMEPRAZOLE 20 MG PO CPDR: 20.0000 mg | DELAYED_RELEASE_CAPSULE | Freq: Every day | ORAL | Status: DC

## 2013-02-09 NOTE — Assessment & Plan Note (Addendum)
Currently not on therapy given her inability to tolerate lipitor.   Check Lipid profile today, consider starting lower potency statin for control if needed.   Goal given HTN would be < 130.   Update: LDL 131, Non HDL 144.  Will discuss lifestyle modification at next visit.

## 2013-02-09 NOTE — Assessment & Plan Note (Signed)
She was evaluated by Cardiology in 2012 and they thought she had PVCs only and TTE was relatively normal.

## 2013-02-09 NOTE — Assessment & Plan Note (Signed)
Her asthma appears to be seasonal (worse in hot weather) and somewhat difficult to control currently with the need to use albuterol twice daily.  She is on a LABA and ICS in Advair.  Possibly could increase Advair to high dose ICS (500 mcg) in the future for better control.  Will discuss with Ms. Yandell at next visit.

## 2013-02-09 NOTE — Progress Notes (Signed)
Subjective:    Patient ID: Erika [name], female    DOB: March 02, 1941, 72 y.o.   MRN: [id number]  CC: Knee pain, f/u of chronic medical issues.   HPI  Erika [name] is a 72yo woman who presents for follow up of her chronic medical problems.   She notes today that she has been experiencing about a week of left sided knee pain.  She reports not having much knee pain before, but having OA in her back.  She reports the pain felt like a "toothache" and made it difficult to bend her knee.  She also noted a full sensation in the back of her knee which has improved.   She tried rest and linament on the knee which has helped somewhat.  She does not report an injury or catching to the knee.  She has no point tenderness.  She has not tried ice or heat.  She denies swelling, erythema or heat to the joint.  Associated was some "catching" in her left hip which has resolved.    She further reports off an on vaginal itching, which she can not delineate when it started.  She is not currently having the symptoms.  She reports some itching which causes her to take another shower.  This symptom seems to be worse when she is in the pool a lot.  After taking a second shower it improves.  She feels that there is some skin change "down there" but since gaining so much weight, she has had difficulty seeing it. She has seen no discharge, no blood.  She is not douching.  As she does not have the symptoms today, she prefers not to have a vaginal exam.    She further reports occasional painful twinges at the site of her shingles.  This is not constant and does not change her daily activity.  No rash.   She has chronic health issues including - HTN: Well controlled today on amlodipine, no issues or side effects from the medication - HLD - Last LDL in 2013 was 114, she could not tolerate lipitor previously.  - Irreg Heartbeat - EKG in 2013 with PVCs, seen by cardiology in 2012 and they noted only PVCs.  TTE done showed normal  Systolic function and grade 1 diastolic dysfunction.  - Asthma - she has what appears to be regular exacerbations due to congestion.  She uses albuterol BID and does well. She avoids going outside in the heat as this exacerbates her symptoms.  She is also taking Advair.     She is due for a mammogram.   She is not smoking.    Review of Systems  Constitutional: Negative for fever, chills and activity change.  HENT: Negative for hearing loss, ear pain, sore throat and trouble swallowing.   Eyes: Negative for pain and visual disturbance.  Respiratory: Positive for shortness of breath (when asthma acts up) and wheezing. Negative for cough.   Cardiovascular: Negative for chest pain, palpitations and leg swelling.  Gastrointestinal: Negative for nausea, vomiting and abdominal pain.  Genitourinary: Negative for dysuria, vaginal bleeding, vaginal discharge, difficulty urinating and vaginal pain.       Vaginal itching, feels like there is skin breakdown, comes and goes, not present today.   Musculoskeletal: Positive for back pain and arthralgias. Negative for joint swelling and gait problem.  Skin: Negative for color change and rash.  Neurological: Negative for dizziness, light-headedness and headaches.  Psychiatric/Behavioral: Negative for confusion and decreased concentration.  Objective:   Physical Exam  Vitals reviewed. Constitutional: She is oriented to person, place, and time. She appears well-developed and well-nourished. No distress.  Obese  HENT:  Head: Normocephalic and atraumatic.  Mouth/Throat: No oropharyngeal exudate.  Eyes: Pupils are equal, round, and reactive to light. No scleral icterus.  Cardiovascular: Normal rate, regular rhythm and normal heart sounds.   No murmur heard. Pulmonary/Chest: Effort normal. She has wheezes (expiratory, mild). She exhibits no tenderness.  Abdominal: Soft. Bowel sounds are normal. She exhibits no distension.  Musculoskeletal:        Right knee: She exhibits decreased range of motion. She exhibits no swelling, no effusion and no ecchymosis. No tenderness found.       Left knee: She exhibits decreased range of motion. She exhibits no swelling, no effusion, no ecchymosis, no deformity, no laceration, no erythema, normal alignment, normal patellar mobility and no bony tenderness. No tenderness found. No medial joint line, no lateral joint line and no patellar tendon tenderness noted.  Neurological: She is alert and oriented to person, place, and time.  Skin: Skin is warm and dry. No erythema.  Psychiatric: She has a normal mood and affect. Her behavior is normal.   Labs today: Lipid panel, CMET     Assessment & Plan:  RTC in 2 months, sooner if needed.  Will work in if vaginal itching recurs.

## 2013-02-09 NOTE — Telephone Encounter (Signed)
Empty request 

## 2013-02-09 NOTE — Assessment & Plan Note (Addendum)
BP Readings from Last 3 Encounters:  02/09/13 130/80  09/09/12 136/64  02/27/12 146/88    Lab Results  Component Value Date   NA 129* 09/09/2012   K 3.0* 09/09/2012   CREATININE 0.77 09/09/2012    Assessment: Blood pressure control: controlled Progress toward BP goal:  at goal Comments: Last BMET was from the hospital  Plan: Medications:  continue current medications Educational resources provided:   Self management tools provided:   Other plans: Doing well, recheck BMET today for renal function and Na  Update: Renal function and Na within baseline

## 2013-02-09 NOTE — Assessment & Plan Note (Signed)
Since this seems to be chronic and recurrent, but resolves without any medical intervention, have asked her to call for appointment when the symptoms recur and we will work her in to be seen.

## 2013-02-09 NOTE — Assessment & Plan Note (Signed)
DEXA - nml in 2008, consider repeat at next visit MMG - nml in 2012, schedule today Pneumo - done Flu - due next flu season Pap - nml in 2002, 2008, 2011, > 70yo old, no need to screen.

## 2013-02-09 NOTE — Assessment & Plan Note (Signed)
Acute.  Will check Xray to evaluate for OA.  Advised her to continue with rest, can add heat/ice and can try tylenol.

## 2013-02-09 NOTE — Patient Instructions (Addendum)
  General Instructions: Please schedule a follow up visit within the next 2 months for evaluation of knee pain and asthma.   For your medications:   Please bring all of your pill  Bottles with you to each visit.  This will help make sure that we have an up to date list of all the medications you are taking.  Please also bring any over the counter herbal medications you are taking (not including advil, tylenol, etc.)  Please continue taking all of your medications as prescribed.   If your itching returns, please call the clinic and we will work you in to be seen.  If your pain at the site of your previous shingles becomes constant or affects your daily activities, please let me know.   Thank you!   Treatment Goals:  Goals (1 Years of Data) as of 02/09/13   None      Progress Toward Treatment Goals:  Treatment Goal 02/09/2013  Blood pressure at goal    Self Care Goals & Plans:       Care Management & Community Referrals:  Referral 02/09/2013  Referrals made for care management support none needed

## 2013-02-13 ENCOUNTER — Encounter: Payer: Self-pay | Admitting: Internal Medicine

## 2013-02-17 ENCOUNTER — Other Ambulatory Visit: Payer: Self-pay | Admitting: Internal Medicine

## 2013-02-25 ENCOUNTER — Other Ambulatory Visit: Payer: Self-pay | Admitting: Internal Medicine

## 2013-03-13 ENCOUNTER — Ambulatory Visit (HOSPITAL_COMMUNITY)
Admission: RE | Admit: 2013-03-13 | Discharge: 2013-03-13 | Disposition: A | Discharge status: Home / Self Care | Payer: Medicare Other | Source: Ambulatory Visit | Attending: Internal Medicine | Admitting: Internal Medicine

## 2013-03-13 ENCOUNTER — Encounter: Payer: Self-pay | Admitting: Internal Medicine

## 2013-03-13 ENCOUNTER — Ambulatory Visit: Payer: Medicare Other | Admitting: Internal Medicine

## 2013-03-13 ENCOUNTER — Ambulatory Visit (INDEPENDENT_AMBULATORY_CARE_PROVIDER_SITE_OTHER): Payer: Medicare Other | Admitting: Internal Medicine

## 2013-03-13 VITALS — BP 148/91 | HR 90 | Temp 97.8°F | Ht 67.0 in | Wt 328.1 lb

## 2013-03-13 DIAGNOSIS — Z1231 Encounter for screening mammogram for malignant neoplasm of breast: Secondary | ICD-10-CM | POA: Insufficient documentation

## 2013-03-13 DIAGNOSIS — M25562 Pain in left knee: Secondary | ICD-10-CM

## 2013-03-13 DIAGNOSIS — Z0000 Encounter for general adult medical examination without abnormal findings: Secondary | ICD-10-CM

## 2013-03-13 DIAGNOSIS — J029 Acute pharyngitis, unspecified: Secondary | ICD-10-CM

## 2013-03-13 DIAGNOSIS — M25569 Pain in unspecified knee: Secondary | ICD-10-CM

## 2013-03-13 MED ORDER — ALBUTEROL SULFATE HFA 108 (90 BASE) MCG/ACT IN AERS: INHALATION_SPRAY | RESPIRATORY_TRACT | Status: DC

## 2013-03-13 MED ORDER — FLUTICASONE-SALMETEROL 250-50 MCG/DOSE IN AEPB: INHALATION_SPRAY | RESPIRATORY_TRACT | Status: DC

## 2013-03-13 MED ORDER — TRAMADOL HCL 50 MG PO TABS: 50.0000 mg | ORAL_TABLET | Freq: Four times a day (QID) | ORAL | Status: DC | PRN

## 2013-03-13 NOTE — Assessment & Plan Note (Addendum)
Pt reports no relief from OA of the left knee from rest, ice/heat, or tylenol.   -f/u with Dr. Criselda Peaches  -continue with Dr. Donnetta Hutching recommendations of rest, ice/heat, and tylenol -consider a referral to sports medicine

## 2013-03-13 NOTE — Progress Notes (Signed)
I saw and evaluated the patient.  I personally confirmed the key portions of the history and exam documented by Dr. Gill and I reviewed pertinent patient test results.  The assessment, diagnosis, and plan were formulated together and I agree with the documentation in the resident's note. 

## 2013-03-13 NOTE — Patient Instructions (Addendum)
Please keep your scheduled appointment with Dr. Criselda Peaches  1. Please continue your current medications 2. You may take tylenol as needed for your ear pain 3. Dr. Criselda Peaches will discuss with you on your next visit regarding options for your left knee osteoarthritis pain; continue her recommendations of rest, heat/ice, and tylenol for now 4. Continue mucinex as needed for congestion or other over-the-counter antihistamine medications  5. If possible, try to see a dentist at your earliest convenience

## 2013-03-13 NOTE — Progress Notes (Signed)
Patient ID: Erika [name], female   DOB: 09/01/1940, 72 y.o.   MRN: [id number]            HPI: [name] is a 72 y.o. AAF with a PMH of asthma, HTN, GERD, and OA.  She presents to the clinic for an acute visit.  She describes a chronic cough with clear sputum production which she treats with mucinex.  She states the cough seems to be worse with weather changes. She is using her advair as prescribed and uses the albuterol when she is going out for a walk.  She describes the cough as no different from her baseline cough.  She denies any SOB, CP, fever, chills, lymphadenopathy, N/V,or HA.  She reports for the past week she has been experiencing left-sided ear pain which occurs mainly in the morning.  She describes the pain as a "fullness" in her ear that is exacerbated by cough and comes and goes.  Additionally, she reports a mild sore throat.    She also reports continued pain in her left knee.  She reports complying with Dr. Donnetta Hutching previous recommendation of rest, applying ice/heat, and tylenol as needed for pain.    She has no additional complaints today other than medication refills for tramadol, advair, and albuterol.     Past Medical History  Diagnosis Date  . Hypertension   . Tuberculosis     active TB treated in 2002, hx of paraspinal lumbar TB,   . Obesity     BMI 54  . Menopause   . Breast cyst     Excesion with FNA, begnin in 2004.   [name] Shingles   . Asthma   . SVT (supraventricular tachycardia) June 2009    one run while hospitalized  . Back pain     status post surgery 2002  . Stasis dermatitis     W/ LE edema, prviously on lasix now on mazxide.   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  . Lymphadenopathy     Of the mediastinum, Right side CXR 2008, not read on 2010 cxr.   [name] History of shingles     Recurrent with post herpetic neuralgia.   . Degenerative joint disease of spine     Imaging 2005,  Degenerative hypertrophic facet arthritis changes L4-5 and  L5-S1..   . Anemia     with menses   Current Outpatient Prescriptions  Medication Sig Dispense Refill  . albuterol (PROAIR HFA) 108 (90 BASE) MCG/ACT inhaler INHALE 2 PUFFS INTO THE LUNGS EVERY 6 HOURS AS NEEDED FOR WHEEZING  8.5 g  0  . amLODipine (NORVASC) 10 MG tablet Take 1 tablet (10 mg total) by mouth daily.  30 tablet  2  . Fluticasone-Salmeterol (ADVAIR DISKUS) 250-50 MCG/DOSE AEPB inhale 1 puff twice a day  60 each  0  . omeprazole (PRILOSEC) 20 MG capsule Take 1 capsule (20 mg total) by mouth daily.  90 capsule  3  . ondansetron (ZOFRAN) 4 MG tablet Take 1 tablet (4 mg total) by mouth every 6 (six) hours.  12 tablet  0  . traMADol (ULTRAM) 50 MG tablet Take 1 tablet (50 mg total) by mouth every 6 (six) hours as needed for pain (take 1 to 2 tablets by mouth every 6 hours if needed for pain).  30 tablet  0   No current facility-administered medications for this visit.   Family History  Problem Relation Age of Onset  . Stroke Mother  at young age  . Stroke Father     in 62's  . Heart attack Father   . Stroke Brother   . Stroke Brother    History   Social History  . Marital Status: Widowed    Spouse Name: N/A    Number of Children: N/A  . Years of Education: N/A   Occupational History  . 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 volunteers at local school    Social History Main Topics  . Smoking status: Never Smoker   . Smokeless tobacco: Never Used  . Alcohol Use: No  . Drug Use: No  . Sexually Active: No   Other Topics Concern  . None   Social History Narrative   No regular exercise, retired, single, volunteers at a local school.     Review of Systems: Constitutional: Denies fever, chills, diaphoresis, appetite change and fatigue.  Respiratory: Denies SOB, DOE, chest tightness. Cardiovascular: No chest pain, palpitations and leg swelling.  Gastrointestinal: No abdominal pain, nausea, vomiting, bloody stools Genitourinary: No dysuria, frequency,  hematuria, or flank pain.  Musculoskeletal: No myalgias, back pain, joint swelling, arthralgias    Objective:  Physical Exam: Filed Vitals:   03/13/13 1349  BP: 148/91  Pulse: 90  Temp: 97.8 F (36.6 C)  TempSrc: Oral  Height: 5\' 7"  (1.702 m)  Weight: 328 lb 1.6 oz (148.825 kg)  SpO2: 98%   General: Well nourished. No acute distress.  Lungs: minimal wheezing throughout all lung fields. Heart: RRR; no extra sounds or murmurs  Abdomen: Non-distended, normal BS, soft, nontender; no hepatosplenomegaly  Extremities: No pedal edema. No joint swelling or tenderness. Neurologic: Alert and oriented x3. No obvious neurologic deficits.  Assessment & Plan:  I have discussed my assessment and plan  with Dr. Aundria Rud as detailed under problem based charting.

## 2013-03-13 NOTE — Assessment & Plan Note (Signed)
Pt has a chronic cough and most likely has a viral illness acutely.  Her vital signs and physical exam are unremarkable except for minimal wheezing throughout the lung fields.  Asthma appears stable.  -tylenol as needed for your ear pain -continue mucinex as needed for congestion or other over-the-counter antihistamine medications

## 2013-04-13 ENCOUNTER — Ambulatory Visit (INDEPENDENT_AMBULATORY_CARE_PROVIDER_SITE_OTHER): Payer: Medicare Other | Admitting: Internal Medicine

## 2013-04-13 ENCOUNTER — Encounter: Payer: Self-pay | Admitting: Internal Medicine

## 2013-04-13 VITALS — BP 133/82 | HR 98 | Temp 96.9°F | Ht 66.0 in | Wt 327.4 lb

## 2013-04-13 DIAGNOSIS — Z23 Encounter for immunization: Secondary | ICD-10-CM

## 2013-04-13 DIAGNOSIS — M25569 Pain in unspecified knee: Secondary | ICD-10-CM

## 2013-04-13 DIAGNOSIS — N898 Other specified noninflammatory disorders of vagina: Secondary | ICD-10-CM

## 2013-04-13 DIAGNOSIS — A1801 Tuberculosis of spine: Secondary | ICD-10-CM

## 2013-04-13 DIAGNOSIS — L293 Anogenital pruritus, unspecified: Secondary | ICD-10-CM

## 2013-04-13 DIAGNOSIS — I10 Essential (primary) hypertension: Secondary | ICD-10-CM

## 2013-04-13 DIAGNOSIS — Z0000 Encounter for general adult medical examination without abnormal findings: Secondary | ICD-10-CM

## 2013-04-13 DIAGNOSIS — M479 Spondylosis, unspecified: Secondary | ICD-10-CM

## 2013-04-13 DIAGNOSIS — J45909 Unspecified asthma, uncomplicated: Secondary | ICD-10-CM

## 2013-04-13 DIAGNOSIS — E785 Hyperlipidemia, unspecified: Secondary | ICD-10-CM

## 2013-04-13 DIAGNOSIS — M25562 Pain in left knee: Secondary | ICD-10-CM

## 2013-04-13 MED ORDER — FLUTICASONE-SALMETEROL 500-50 MCG/DOSE IN AEPB: 1.0000 | INHALATION_SPRAY | Freq: Two times a day (BID) | RESPIRATORY_TRACT | Status: DC

## 2013-04-13 MED ORDER — AMLODIPINE BESYLATE 10 MG PO TABS: 10.0000 mg | ORAL_TABLET | Freq: Every day | ORAL | Status: DC

## 2013-04-13 MED ORDER — ALBUTEROL SULFATE HFA 108 (90 BASE) MCG/ACT IN AERS: INHALATION_SPRAY | RESPIRATORY_TRACT | Status: DC

## 2013-04-13 NOTE — Assessment & Plan Note (Signed)
Somewhat uncontrolled, though seems to be worse during the summer.  Will attempt a higher dose of ICS with Advair 500/50 which was sent to the pharmacy today.  Will evaluate for improvement at next visit.

## 2013-04-13 NOTE — Assessment & Plan Note (Signed)
Improved with vagisil.  Continue as needed.

## 2013-04-13 NOTE — Assessment & Plan Note (Signed)
BP Readings from Last 3 Encounters:  04/13/13 133/82  03/13/13 148/91  02/09/13 130/80    Lab Results  Component Value Date   NA 138 02/09/2013   K 4.6 02/09/2013   CREATININE 0.93 02/09/2013    Assessment: Blood pressure control: controlled Progress toward BP goal:  at goal Comments: No issues with amlodipine, renal function stable  Plan: Medications:  continue current medications Educational resources provided: brochure Self management tools provided: home blood pressure logbook Other plans: She is going to start exercising with aquatherapy.

## 2013-04-13 NOTE — Assessment & Plan Note (Signed)
DEXA - nml in 2008, repeat ordered for this year.   MMG - Nml 03/13/13 Pneumo - done Flu - today  Pap - nml in 2002, 2008, 2011, > 70yo old, no need to screen.

## 2013-04-13 NOTE — Assessment & Plan Note (Signed)
She has good control of pain with OTC NSAIDS, however, she has had good results with steroid injections in the past and would like to try this route.  Will place referral for Sports Medicine.

## 2013-04-13 NOTE — Patient Instructions (Addendum)
General Instructions: Please schedule a follow up visit within the next 4 months.   For your medications:   Please bring all of your pill  Bottles with you to each visit.  This will help make sure that we have an up to date list of all the medications you are taking.  Please also bring any over the counter herbal medications you are taking (not including advil, tylenol, etc.)  Please continue taking all of your medications as prescribed.  You will continue to take Advair, but at a higher dose of inhaled steroid to help better control your asthma.   You will be scheduled for a DEXA (bone density scan) and to see the Sports Medicine Doctors for your left knee pain and arthritis.    Please call with any questions!  Thank you!   Treatment Goals:  Goals (1 Years of Data) as of 04/13/13   None      Progress Toward Treatment Goals:  Treatment Goal 04/13/2013  Blood pressure at goal    Self Care Goals & Plans:  Self Care Goal 04/13/2013  Manage my medications take my medicines as prescribed; bring my medications to every visit  Monitor my health keep track of my blood pressure       Care Management & Community Referrals:  Referral 04/13/2013  Referrals made for care management support none needed

## 2013-04-13 NOTE — Progress Notes (Signed)
Subjective:    Patient ID: Erika [name], female    DOB: 03/10/1941, 72 y.o.   MRN: [id number]  CC: Follow up  HPI  [name] is a 72yo woman with PMH of HTN, HLD, Asthma, GERD, OA who presents for follow up.   She reports that her left knee is better, she is using a cane and avoiding stairs, but otherwise she does not have any limitations due to her knee pain.  She has used both aleve and ibuprofen with good relief of her pain.  She is still interested in possible steroid injections as she has had success with this type of therapy before and would like a referral to sports medicine.  She has no swelling, redness, tenderness to palpation of the left knee at this point.  We reviewed her knee xray.     The vaginal itching she reported at last appointment has improved with vagisil.    She reports that her asthma is doing "pretty good."  She uses her advair as prescribed twice daily and uses her albuterol twice daily as well with an occasional third use if the day is particularly hot or she has to walk a lot.  She reports having a good summer this year and not needing the albuterol as much.  She does actively wheeze almost daily, improved with therapy.     Her insurance told her she needed a dilated eye exam to screen for glaucoma.   Other chronic issues include HTN.  Her BP was 145/91 on recheck 133/82.  She is taking amlodipine with good results and no side effects.  She has HLD, last LDL was 131, she is not taking any medication and she is at goal.    She is not smoking.   We discussed the shingles vaccine, she would like more information to review.    Current Outpatient Prescriptions on File Prior to Visit  Medication Sig Dispense Refill  . albuterol (PROAIR HFA) 108 (90 BASE) MCG/ACT inhaler INHALE 2 PUFFS INTO THE LUNGS EVERY 6 HOURS AS NEEDED FOR WHEEZING  8.5 g  0  . amLODipine (NORVASC) 10 MG tablet Take 1 tablet (10 mg total) by mouth daily.  30 tablet  2  .  Fluticasone-Salmeterol (ADVAIR DISKUS) 250-50 MCG/DOSE AEPB inhale 1 puff twice a day  60 each  0  . omeprazole (PRILOSEC) 20 MG capsule Take 1 capsule (20 mg total) by mouth daily.  90 capsule  3  . ondansetron (ZOFRAN) 4 MG tablet Take 1 tablet (4 mg total) by mouth every 6 (six) hours.  12 tablet  0  . traMADol (ULTRAM) 50 MG tablet Take 1 tablet (50 mg total) by mouth every 6 (six) hours as needed for pain (take 1 to 2 tablets by mouth every 6 hours if needed for pain).  30 tablet  0    Review of Systems  Constitutional: Negative for chills, appetite change and fatigue.  HENT: Positive for dental problem (bad teeth). Negative for hearing loss, ear pain, nosebleeds, tinnitus and ear discharge.   Eyes: Negative for photophobia, pain and visual disturbance.  Respiratory: Positive for shortness of breath and wheezing. Negative for chest tightness.   Cardiovascular: Negative for chest pain and palpitations.  Gastrointestinal: Negative for nausea, vomiting, diarrhea and blood in stool.  Genitourinary: Negative for dysuria and difficulty urinating.  Musculoskeletal: Positive for back pain, arthralgias and gait problem (due to pain and weight).  Skin: Negative for color change, pallor and rash.  Neurological: Negative  for dizziness, light-headedness and headaches.  Psychiatric/Behavioral: Negative for confusion and decreased concentration (recent good clock drawing exercise by insurance).       Objective:   Physical Exam  Constitutional: She is oriented to person, place, and time. She appears well-developed and well-nourished. No distress.  Elderly woman, appears younger than stated age  HENT:  Head: Normocephalic and atraumatic.  Mouth/Throat: Oropharynx is clear and moist. No oropharyngeal exudate.  Eyes: Conjunctivae are normal. Pupils are equal, round, and reactive to light. No scleral icterus.  Neck: Neck supple.  Cardiovascular: Normal rate, regular rhythm and normal heart sounds.    No murmur heard. Pulmonary/Chest: Effort normal. No respiratory distress. She has wheezes (mild, upper lobes).  Abdominal: Soft. Bowel sounds are normal.  Lymphadenopathy:    She has no cervical adenopathy.  Neurological: She is alert and oriented to person, place, and time.  Skin: Skin is warm and dry. No rash noted. No erythema.  Psychiatric: She has a normal mood and affect. Her behavior is normal.   No labs today      Assessment & Plan:  RTC in 4 months.

## 2013-04-13 NOTE — Assessment & Plan Note (Signed)
Last LDL was 131, based on history of HTN her goal is < 130.  I discussed lifestyle changes with her, however, I would guess she is actually at goal as her Lipid panel was non fasting.  No therapy started today.

## 2013-04-15 ENCOUNTER — Other Ambulatory Visit: Payer: Self-pay | Admitting: *Deleted

## 2013-04-15 ENCOUNTER — Ambulatory Visit (HOSPITAL_COMMUNITY)
Admission: RE | Admit: 2013-04-15 | Discharge: 2013-04-15 | Disposition: A | Discharge status: Home / Self Care | Payer: Medicare Other | Source: Ambulatory Visit | Attending: Internal Medicine | Admitting: Internal Medicine

## 2013-04-15 DIAGNOSIS — M479 Spondylosis, unspecified: Secondary | ICD-10-CM

## 2013-04-15 DIAGNOSIS — Z13820 Encounter for screening for osteoporosis: Secondary | ICD-10-CM | POA: Insufficient documentation

## 2013-04-15 DIAGNOSIS — A1801 Tuberculosis of spine: Secondary | ICD-10-CM

## 2013-04-15 DIAGNOSIS — Z780 Asymptomatic menopausal state: Secondary | ICD-10-CM | POA: Insufficient documentation

## 2013-04-15 MED ORDER — TRAMADOL HCL 50 MG PO TABS: 50.0000 mg | ORAL_TABLET | Freq: Four times a day (QID) | ORAL | Status: DC | PRN

## 2013-04-15 NOTE — Telephone Encounter (Signed)
Called to pharm 

## 2013-04-15 NOTE — Telephone Encounter (Signed)
Would you like to change the amt to at least #60, pt states sometimes she needs to take 2

## 2013-04-22 ENCOUNTER — Encounter: Payer: Self-pay | Admitting: Sports Medicine

## 2013-04-22 ENCOUNTER — Ambulatory Visit (INDEPENDENT_AMBULATORY_CARE_PROVIDER_SITE_OTHER): Payer: Medicare Other | Admitting: Sports Medicine

## 2013-04-22 VITALS — BP 139/83 | HR 99 | Ht 66.0 in | Wt 327.0 lb

## 2013-04-22 DIAGNOSIS — M25562 Pain in left knee: Secondary | ICD-10-CM

## 2013-04-22 DIAGNOSIS — M1710 Unilateral primary osteoarthritis, unspecified knee: Secondary | ICD-10-CM

## 2013-04-22 DIAGNOSIS — M1712 Unilateral primary osteoarthritis, left knee: Secondary | ICD-10-CM

## 2013-04-22 DIAGNOSIS — M25569 Pain in unspecified knee: Secondary | ICD-10-CM

## 2013-04-22 MED ORDER — METHYLPREDNISOLONE ACETATE 40 MG/ML IJ SUSP
40.0000 mg | Freq: Once | INTRAMUSCULAR | Status: AC
  Administered 2013-04-22: 40 mg via INTRA_ARTICULAR

## 2013-04-22 NOTE — Progress Notes (Signed)
  Subjective:    Patient ID: Erika [name], female    DOB: [birth year], 72 y.o.   MRN: [id number]  HPI [name] is a 72 year old female who is coming in for evaluation of left knee pain. She has had intermittent left knee pain for several months, but had a really bad flare in July which caused her to feel unstable on her knees. She saw her PCP who recommended ice, ibuprofen, and tylenol. She has since felt a little better, but continues to have a 3/10 dull, aching pain in her left knee that gets worse with walking or standing for a long time, or when the weather gets cold. She has been alternating between tylenol and motrin, and usually takes about 2 pills/day. She also walks with a cane in her right hand which she has been using for about a year. She has had some stiffness in the knee and may have had some swelling, but denies any catching, locking, or painful popping. She has had a CSI in her foot several years ago, which really helped manage her foot pain, so she is interested in doing that today for her knee. She has never been to physical therapy.    Review of Systems     Objective:   Physical Exam  Constitutional: She is oriented to person, place, and time. She appears well-developed.  obese  HENT:  Head: Normocephalic and atraumatic.  Pulmonary/Chest: Effort normal.  Neurological: She is alert and oriented to person, place, and time.  Psychiatric: She has a normal mood and affect. Her behavior is normal.   Left knee: Mild effusion, + lateral joint line tenderness to palpation, no patellar facet tenderness, negative patellar grind. Flexion to 120 degrees, lacks about 10 degrees of extension (equal to right side). 5/5 strength with knee flexion/extension. + painful popping in lateral joint line with McMurrays. Apley's test at 0 degrees and 30 degrees extension causes medial joint line tenderness. ACL/PCL wnl. MCL/LCL wnl with varus/valgus stress at 0 and 20 degrees.    Xrays from  02/09/13 reviewed - arthritic changes with lateral > medial compartment arthritis     Assessment & Plan:  72 year old female with left knee osteoarthritis - Plan to inject knee as below - Continue PRN motrin/tylenol - Home exercises  - F/U PRN  Consent obtained and verified. Time-out conducted. Noted no overlying erythema, induration, or other signs of local infection. Skin prepped in a sterile fashion. Topical analgesic spray: Ethyl chloride. Joint: Left knee Needle: 25 gauge, 1.5 inch Completed without difficulty. Meds: 1 cc (40 mg) depomedrol with 3 cc 1% xylocaine  Advised to call if fevers/chills, erythema, induration, drainage, or persistent bleeding.

## 2013-06-10 ENCOUNTER — Other Ambulatory Visit: Payer: Self-pay | Admitting: *Deleted

## 2013-06-10 ENCOUNTER — Other Ambulatory Visit: Payer: Self-pay | Admitting: Internal Medicine

## 2013-06-10 MED ORDER — TRAMADOL HCL 50 MG PO TABS: 50.0000 mg | ORAL_TABLET | Freq: Four times a day (QID) | ORAL | Status: DC | PRN

## 2013-06-10 NOTE — Telephone Encounter (Signed)
Called to pharm 

## 2013-06-10 NOTE — Telephone Encounter (Signed)
I filled this today already.  Did I do the wrong route?   Thank you

## 2013-06-18 ENCOUNTER — Encounter: Payer: Self-pay | Admitting: Sports Medicine

## 2013-06-18 ENCOUNTER — Ambulatory Visit (INDEPENDENT_AMBULATORY_CARE_PROVIDER_SITE_OTHER): Payer: Medicare Other | Admitting: Sports Medicine

## 2013-06-18 VITALS — BP 142/92 | Ht 66.0 in | Wt 300.0 lb

## 2013-06-18 DIAGNOSIS — M25562 Pain in left knee: Secondary | ICD-10-CM

## 2013-06-18 DIAGNOSIS — M25569 Pain in unspecified knee: Secondary | ICD-10-CM

## 2013-06-18 DIAGNOSIS — M1712 Unilateral primary osteoarthritis, left knee: Secondary | ICD-10-CM

## 2013-06-18 DIAGNOSIS — M1710 Unilateral primary osteoarthritis, unspecified knee: Secondary | ICD-10-CM

## 2013-06-18 MED ORDER — METHYLPREDNISOLONE ACETATE 40 MG/ML IJ SUSP
40.0000 mg | Freq: Once | INTRAMUSCULAR | Status: AC
  Administered 2013-06-18: 40 mg via INTRA_ARTICULAR

## 2013-06-19 NOTE — Progress Notes (Signed)
  Subjective:    Patient ID: Erika [name], female    DOB: 1940-12-12, 72 y.o.   MRN: [id number]  HPI Patient comes in today complaining of returning left knee pain. Cortisone injection administered a couple of months ago find her with good pain relief. Symptoms have now returned. No trauma. Pain is identical in nature to what she experienced previously.    Review of Systems     Objective:   Physical Exam Obese, no acute distress  Knee: Range of motion 0-120. 1+ body synovitis. She is tender to palpation over the lateral joint line. Mild to moderate valgus deformity. Neurovascularly intact distally. Walking with an obvious limp.  X-rays done in July of this year demonstrates severe lateral compartmental DJD       Assessment & Plan:  Returning left knee pain secondary to advanced lateral compartmental DJD Obesity  I've agreed to reinject her knee today. An anterior medial approach was utilized. Patient tolerated this without difficulty. If symptoms persist I would consider referral to orthopedics for consideration of total knee arthroplasty but her obesity may preclude her from this. Followup when necessary.  Consent obtained and verified. Time-out conducted. Noted no overlying erythema, induration, or other signs of local infection. Skin prepped in a sterile fashion. Topical analgesic spray: Ethyl chloride. Joint: left knee Needle: 25g 1.5 inch Completed without difficulty. Meds: 3cc 1% xylocaine, 1cc (40mg ) depomedrol  Advised to call if fevers/chills, erythema, induration, drainage, or persistent bleeding.

## 2013-08-17 ENCOUNTER — Other Ambulatory Visit: Payer: Self-pay | Admitting: Internal Medicine

## 2013-08-31 ENCOUNTER — Encounter: Payer: Self-pay | Admitting: Internal Medicine

## 2013-08-31 ENCOUNTER — Ambulatory Visit (INDEPENDENT_AMBULATORY_CARE_PROVIDER_SITE_OTHER): Payer: Medicare HMO | Admitting: Internal Medicine

## 2013-08-31 VITALS — BP 151/90 | HR 94 | Temp 97.0°F | Wt 329.0 lb

## 2013-08-31 DIAGNOSIS — E785 Hyperlipidemia, unspecified: Secondary | ICD-10-CM

## 2013-08-31 DIAGNOSIS — IMO0002 Reserved for concepts with insufficient information to code with codable children: Secondary | ICD-10-CM

## 2013-08-31 DIAGNOSIS — I10 Essential (primary) hypertension: Secondary | ICD-10-CM

## 2013-08-31 DIAGNOSIS — M1710 Unilateral primary osteoarthritis, unspecified knee: Secondary | ICD-10-CM

## 2013-08-31 DIAGNOSIS — J45909 Unspecified asthma, uncomplicated: Secondary | ICD-10-CM

## 2013-08-31 DIAGNOSIS — R000 Tachycardia, unspecified: Secondary | ICD-10-CM

## 2013-08-31 DIAGNOSIS — M179 Osteoarthritis of knee, unspecified: Secondary | ICD-10-CM

## 2013-08-31 DIAGNOSIS — Z0000 Encounter for general adult medical examination without abnormal findings: Secondary | ICD-10-CM

## 2013-08-31 MED ORDER — HYDROCHLOROTHIAZIDE 12.5 MG PO CAPS: 12.5000 mg | ORAL_CAPSULE | Freq: Every day | ORAL | Status: DC

## 2013-08-31 MED ORDER — TRAMADOL HCL 50 MG PO TABS: 50.0000 mg | ORAL_TABLET | Freq: Two times a day (BID) | ORAL | Status: DC | PRN

## 2013-08-31 NOTE — Assessment & Plan Note (Signed)
Her LDL is 131 at last check and her 10 year risk of CVD based on calculator is 7%.  We discussed starting a statin, however, she would prefer to hold off on any new medications today given the change in her BP medications.  We will check a Lipid profile and discuss again at next visit.

## 2013-08-31 NOTE — Assessment & Plan Note (Signed)
Improved with increased dose of advair.  She is not wheezing and only has SOB with increased activity (likely due to her inactivity due to pain and need for cane as well)

## 2013-08-31 NOTE — Assessment & Plan Note (Signed)
Improved with steroid injections.  F/U with sports med PRN.

## 2013-08-31 NOTE — Progress Notes (Signed)
Subjective:    Patient ID: Erika [name], female    DOB: 1941-07-22, 73 y.o.   MRN: [id number]  CC: Follow up.   HPI  [name] is a 73yo woman with PMH of HTN, HLD, asthma, GERD, OA who presents for follow up.   She has been seeing Sports Medicine, Dr. Micheline Chapman, for her OA.  Last visit there in November of 2014.  She received a steroid injection to the knee at that time.    [name] reports today that she is doing pretty good.  Knee is much better after injections.    No issues today otherwise, no problems with medications.  Taking albuterol about twice a day and when she knows she is going out, triggers are environmental in nature.    History of SVT, however, only noticed it when she's in the hospital.  She has no palpitations, chest pain, SOB.  Only SOB when walking due to asthma.  No swelling or headaches.  She is mildly tachycardic today but asymptomatic.  Per chart review, she had history of PACs and also a history of PVC's by review of old EKGs.    I refilled her tramadol and filled out paperwork for her to keep her handicap placard for her decreased ambulation and need of cane.   We reviewed her medications.  She is not smoking.   Current Outpatient Prescriptions on File Prior to Visit  Medication Sig Dispense Refill  . ADVAIR DISKUS 250-50 MCG/DOSE AEPB inhale 1 dose by mouth twice a day  60 each  3  . albuterol (PROAIR HFA) 108 (90 BASE) MCG/ACT inhaler INHALE 2 PUFFS INTO THE LUNGS EVERY 6 HOURS AS NEEDED FOR WHEEZING  8.5 g  6  . amLODipine (NORVASC) 10 MG tablet Take 1 tablet (10 mg total) by mouth daily.  90 tablet  2  . omeprazole (PRILOSEC) 20 MG capsule Take 1 capsule (20 mg total) by mouth daily.  90 capsule  3      Review of Systems  Constitutional: Negative for fever, chills and unexpected weight change.  HENT: Negative for congestion, ear discharge and trouble swallowing.   Eyes: Negative for pain and redness.  Respiratory: Positive for shortness of  breath (with exercise, normal for her). Negative for cough and chest tightness.   Cardiovascular: Negative for chest pain, palpitations and leg swelling.  Gastrointestinal: Negative for nausea, vomiting, abdominal pain and blood in stool.  Genitourinary: Negative for dysuria and difficulty urinating.  Musculoskeletal: Positive for arthralgias, back pain (mild) and gait problem (uses a cane).  Skin: Negative for rash and wound.  Neurological: Negative for syncope, light-headedness and headaches.  Psychiatric/Behavioral: Negative for dysphoric mood and decreased concentration.       Objective:   Physical Exam  Constitutional: She is oriented to person, place, and time. She appears well-developed. No distress.  Obese, in NAD  HENT:  Head: Normocephalic and atraumatic.  Mouth/Throat: No oropharyngeal exudate.  Eyes: Pupils are equal, round, and reactive to light. No scleral icterus.  Cardiovascular: Regular rhythm, normal heart sounds and intact distal pulses.   No murmur heard. Mildly tachycardic  Pulmonary/Chest: Effort normal and breath sounds normal. No respiratory distress. She has no wheezes.  Abdominal: Soft. There is no tenderness.  Musculoskeletal: She exhibits tenderness (TTP over left knee). She exhibits no edema.  Neurological: She is alert and oriented to person, place, and time.  Skin: Skin is warm and dry.  Psychiatric: She has a normal mood and affect. Her  behavior is normal.   Filed Vitals:   08/31/13 1017  BP: 151/90  Pulse: 94  Temp:    No labs today.       Assessment & Plan:  RTC in 4-6 months, sooner if needed.

## 2013-08-31 NOTE — Assessment & Plan Note (Signed)
DEXA - Nml in 2014 MMG - Nml 03/13/13 Pneumo - done Flu - 04/13/13 Pap - nml in 2002, 2008, 2011, > 71yo old, no need to screen.

## 2013-08-31 NOTE — Patient Instructions (Signed)
General Instructions: Please schedule a follow up visit within the next 4-6 months, sooner if needed.   For your medications:   Please bring all of your pill  Bottles with you to each visit.  This will help make sure that we have an up to date list of all the medications you are taking.  Please also bring any over the counter herbal medications you are taking (not including advil, tylenol, etc.)  Please continue taking all of your medications as prescribed  Your blood pressure was mildly high today in clinic, we will start a BP medication called Hydrochlorothiazide today at a low dose to see if your blood pressure can be better controlled.  Please start taking hydrochlorothiazide 12.5mg  daily with your amlodipine.   Thank you!   Treatment Goals:  Goals (1 Years of Data) as of 08/31/13   None      Progress Toward Treatment Goals:  Treatment Goal 08/31/2013  Blood pressure unchanged    Self Care Goals & Plans:  Self Care Goal 08/31/2013  Manage my medications take my medicines as prescribed; refill my medications on time  Monitor my health keep track of my weight  Eat healthy foods eat baked foods instead of fried foods; eat foods that are low in salt; drink diet soda or water instead of juice or soda  Be physically active find an activity I enjoy    No flowsheet data found.   Care Management & Community Referrals:  Referral 04/13/2013  Referrals made for care management support none needed

## 2013-08-31 NOTE — Assessment & Plan Note (Signed)
BP Readings from Last 3 Encounters:  08/31/13 151/90  06/18/13 142/92  04/22/13 139/83    Lab Results  Component Value Date   NA 138 02/09/2013   K 4.6 02/09/2013   CREATININE 0.93 02/09/2013    Assessment: Blood pressure control: mildly elevated Progress toward BP goal:  unchanged Comments: She is taking only amlodipine  Plan: Medications:  continue current medications, Add hctz 12.5mg  daily Educational resources provided: brochure Self management tools provided:   Other plans: Have asked patient to monitor for any adverse events.  Her BP is not severely elevated, so we can see her back at a normal interval.

## 2013-08-31 NOTE — Assessment & Plan Note (Signed)
She is mildly tachycardic today, we discussed and I reviewed a Cards note from 2012.  She was noted to have PACs previously and PVCs on EKG.  She is completely asymptomatic.  Will consider checking a yearly EKG and will also discuss signs/symptoms for her to be aware of at every visit.  Continue to monitor. Daily aspirin.

## 2013-10-31 ENCOUNTER — Other Ambulatory Visit: Payer: Self-pay | Admitting: Internal Medicine

## 2013-10-31 DIAGNOSIS — I10 Essential (primary) hypertension: Secondary | ICD-10-CM

## 2013-11-03 ENCOUNTER — Other Ambulatory Visit: Payer: Self-pay | Admitting: Internal Medicine

## 2014-01-28 ENCOUNTER — Encounter (HOSPITAL_COMMUNITY): Payer: Self-pay | Admitting: Emergency Medicine

## 2014-01-28 ENCOUNTER — Emergency Department (INDEPENDENT_AMBULATORY_CARE_PROVIDER_SITE_OTHER)
Admission: EM | Admit: 2014-01-28 | Discharge: 2014-01-28 | Disposition: A | Discharge status: Home / Self Care | Payer: Medicare HMO | Source: Home / Self Care

## 2014-01-28 ENCOUNTER — Telehealth (HOSPITAL_COMMUNITY): Payer: Self-pay | Admitting: *Deleted

## 2014-01-28 DIAGNOSIS — L02419 Cutaneous abscess of limb, unspecified: Secondary | ICD-10-CM

## 2014-01-28 DIAGNOSIS — I8310 Varicose veins of unspecified lower extremity with inflammation: Secondary | ICD-10-CM

## 2014-01-28 DIAGNOSIS — L03115 Cellulitis of right lower limb: Secondary | ICD-10-CM

## 2014-01-28 DIAGNOSIS — L03119 Cellulitis of unspecified part of limb: Secondary | ICD-10-CM

## 2014-01-28 DIAGNOSIS — I872 Venous insufficiency (chronic) (peripheral): Secondary | ICD-10-CM

## 2014-01-28 LAB — CBC WITH DIFFERENTIAL/PLATELET
Basophils Absolute: 0 10*3/uL (ref 0.0–0.1)
Basophils Relative: 0 % (ref 0–1)
Eosinophils Absolute: 0.5 10*3/uL (ref 0.0–0.7)
Eosinophils Relative: 9 % — ABNORMAL HIGH (ref 0–5)
HCT: 39 % (ref 36.0–46.0)
HEMOGLOBIN: 12.8 g/dL (ref 12.0–15.0)
LYMPHS PCT: 27 % (ref 12–46)
Lymphs Abs: 1.5 10*3/uL (ref 0.7–4.0)
MCH: 29.9 pg (ref 26.0–34.0)
MCHC: 32.8 g/dL (ref 30.0–36.0)
MCV: 91.1 fL (ref 78.0–100.0)
Monocytes Absolute: 0.6 10*3/uL (ref 0.1–1.0)
Monocytes Relative: 10 % (ref 3–12)
NEUTROS ABS: 3.2 10*3/uL (ref 1.7–7.7)
Neutrophils Relative %: 54 % (ref 43–77)
Platelets: 238 10*3/uL (ref 150–400)
RBC: 4.28 MIL/uL (ref 3.87–5.11)
RDW: 15.3 % (ref 11.5–15.5)
WBC: 5.7 10*3/uL (ref 4.0–10.5)

## 2014-01-28 LAB — URIC ACID: Uric Acid, Serum: 6.9 mg/dL (ref 2.4–7.0)

## 2014-01-28 MED ORDER — CEPHALEXIN 500 MG PO CAPS: 500.0000 mg | ORAL_CAPSULE | Freq: Four times a day (QID) | ORAL | Status: DC

## 2014-01-28 NOTE — ED Notes (Signed)
Pt. called and said her Rx. was not at the [facility].  I did not see it ordered, just read note that Janne Napoleon NP, planned to give Keflex 500 mg. QID. I told pt. I would call her back.  Shanon Brow notified and e-prescribed Keflex to pt.'s pharmacy.  I called pt.back and let her know it had been sent.  She said the pharmacy called her but they were getting ready to close. She said she will get it in the morning.  I also gave her the Uric acid result of 6.9 and told her to f/u with her doctor as directed.

## 2014-01-28 NOTE — ED Provider Notes (Signed)
CSN: [id number]     Arrival date & time 01/28/14  1209 History   First MD Initiated Contact with Patient 01/28/14 1346     Chief Complaint  Patient presents with  . Ankle Pain   (Consider location/radiation/quality/duration/timing/severity/associated sxs/prior Treatment) HPI Comments: 73 year old morbidly obese female presents with pain and swelling to the right ankle for approximately 2 weeks. She states the symptoms appeared to be better than they have in the past few days. Is complaining of tenderness as well as concern for discoloration in the feet ankle and lower right leg. Denies known injury. Gradual onset.   Past Medical History  Diagnosis Date  . Hypertension   . Tuberculosis     active TB treated in 2002, hx of paraspinal lumbar TB,   . Obesity     BMI 54  . Menopause   . Breast cyst     Excesion with FNA, begnin in 2004.   [name] Shingles   . Asthma   . SVT (supraventricular tachycardia) June 2009    one run while hospitalized  . Back pain     status post surgery 2002  . Stasis dermatitis     W/ LE edema, prviously on lasix now on mazxide.   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  . Lymphadenopathy     Of the mediastinum, Right side CXR 2008, not read on 2010 cxr.   [name] History of shingles     Recurrent with post herpetic neuralgia.   . Degenerative joint disease of spine     Imaging 2005,  Degenerative hypertrophic facet arthritis changes L4-5 and L5-S1..   . Anemia     with menses   Past Surgical History  Procedure Laterality Date  . Hemilaminectomy of l4, l5 and s1 decompression of tumor in epidural space  2000  . Breast cyst biopsy    . Cholecystectomy  02/02/2012    Procedure: LAPAROSCOPIC CHOLECYSTECTOMY;  Surgeon: Zenovia Jarred, MD;  Location: [facility] OR;  Service: General;  Laterality: N/A;   Family History  Problem Relation Age of Onset  . Stroke Mother     at young age  . Stroke Father     in 24's  . Heart attack Father   . Stroke Brother   . Stroke  Brother    History  Substance Use Topics  . Smoking status: Never Smoker   . Smokeless tobacco: Never Used  . Alcohol Use: No   OB History   Grav Para Term Preterm Abortions TAB SAB Ect Mult Living                 Review of Systems  Constitutional: Positive for activity change. Negative for fever and fatigue.  Respiratory: Negative.  Negative for cough, choking, chest tightness, shortness of breath, wheezing and stridor.   Cardiovascular: Positive for leg swelling. Negative for chest pain and palpitations.  Musculoskeletal: Positive for joint swelling. Negative for back pain, myalgias and neck pain.  Skin: Positive for color change.  Neurological: Negative.     Allergies  Prednisone  Home Medications   Prior to Admission medications   Medication Sig Start Date End Date Taking? Authorizing Provider  ADVAIR DISKUS 250-50 MCG/DOSE AEPB inhale 1 dose by mouth twice a day 08/17/13  Yes [name], MD  albuterol (PROAIR HFA) 108 (90 BASE) MCG/ACT inhaler INHALE 2 PUFFS INTO THE LUNGS EVERY 6 HOURS AS NEEDED FOR WHEEZING 04/13/13  Yes [name], MD  amLODipine ([place]) 10  MG tablet Take 1 tablet (10 mg total) by mouth daily. 04/13/13  Yes [name], MD  hydrochlorothiazide (MICROZIDE) 12.5 MG capsule Take 1 capsule (12.5 mg total) by mouth daily.   Yes Karren Cobble, MD  omeprazole (PRILOSEC) 20 MG capsule Take 1 capsule (20 mg total) by mouth daily. 02/09/13  Yes [name], MD  traMADol (ULTRAM) 50 MG tablet Take 1 tablet (50 mg total) by mouth every 12 (twelve) hours as needed. 08/31/13  Yes [name], MD   BP 163/79  Pulse 90  Temp(Src) 97.9 F (36.6 C) (Oral)  Resp 22  SpO2 96% Physical Exam  Nursing note and vitals reviewed. Constitutional: She is oriented to person, place, and time. She appears well-developed. No distress.  Neck: Normal range of motion.  Cardiovascular: Normal rate and normal heart sounds.   Pulmonary/Chest: Effort normal and breath  sounds normal. No respiratory distress.  Musculoskeletal:  2+ pitting edema feet , ankles and lower half of the lower leg. Hyperpigmented areas of the ankle suggestive of venous stasis changes; redness and smooth tension of the dermis above the ankle along the anterolateral aspect. No calf swelling or tenderness or pain. Pedal pulse 3+. Full ROM ankle , foot.   Neurological: She is alert and oriented to person, place, and time. She exhibits normal muscle tone.  Skin: Skin is warm and dry. She is not diaphoretic. There is erythema.  Psychiatric: She has a normal mood and affect.    ED Course  Procedures (including critical care time) Labs Review Labs Reviewed  CBC WITH DIFFERENTIAL  URIC ACID    Imaging Review No results found.   MDM   1. Venous stasis dermatitis of right lower extremity   2. Cellulitis of right lower extremity     CBC and uric acid Elevation Low tension compression stockings Keflex 500 qid Call PCP for f/u appt today If worsening, new sx's return or go to the ED.      Janne Napoleon, NP 01/28/14 1419

## 2014-01-28 NOTE — Discharge Instructions (Signed)
Cellulitis Keep legs elevated, wear light tension stockings Compression Stockings Compression stockings are elastic stockings that "compress" your legs. This helps to increase blood flow, decrease swelling, and reduces the chance of getting blood clots in your lower legs. Compression stockings are used:  After surgery.  If you have a history of poor circulation.  If you are prone to blood clots.  If you have varicose veins.  If you sit or are bedridden for long periods of time. WEARING COMPRESSION STOCKINGS  Your compression stockings should be worn as instructed by your caregiver.  Wearing the correct stocking size is important. Your caregiver can help measure and fit you to the correct size.  When wearing your stockings, do not allow the stockings to bunch up. This is especially important around your toes or behind your knees. Keep the stockings as smooth as possible.  Do not roll the stockings downward and leave them rolled down. This can form a restrictive band around your legs and can decrease blood flow.  The stockings should be removed once a day for 1 hour or as instructed by your caregiver. When the stockings are taken off, inspect your legs and feet. Look for:  Open sores.  Red spots.  Puffy areas (swelling).  Anything that does not seem normal. IMPORTANT INFORMATION ABOUT COMPRESSION STOCKINGS  The compression stockings should be clean, dry, and in good condition before you put them on.  Do not put lotion on your legs or feet. This makes it harder to put the stockings on.  Change your stockings immediately if they become wet or soiled.  Do not wear stockings that are ripped or torn.  You may hand-wash or put your stockings in the washing machine. Use cold or warm water with mild detergent. Do not bleach your stockings. They may be air-dried or dried in the dryer on low heat.  If you have pain or have a feeling of "pins and needles" in your feet or legs, you may  be wearing stockings that are too tight. Call your caregiver right away. SEEK IMMEDIATE MEDICAL CARE IF:   You have numbness or tingling in your lower legs that does not get better quickly after the stockings are removed.  Your toes or feet become cold and blue.  You develop open sores or have red spots on your legs that do not go away. MAKE SURE YOU:   Understand these instructions.  Will watch your condition.  Will get help right away if you are not doing well or get worse. Document Released: 05/20/2009 Document Revised: 10/15/2011 Document Reviewed: 05/20/2009 [facility] Patient Information 2015 [place], [place]. This information is not intended to replace advice given to you by your health care provider. Make sure you discuss any questions you have with your health care provider.  Stasis Dermatitis Stasis dermatitis occurs when veins lose the ability to pump blood back to the heart (poor venous circulation). Dry skin commonly occurs along with poor venous circulation. Stasis dermatitis appears as red, scaly, itchy patches on the legs. A yellowish or light brown discoloration is also present. Due to scratching or other injury, these patches can become an ulcer. This ulcer may remain for long periods of time. The ulcer can also become infected. Swelling of the legs is often present with stasis dermatitis. If the leg is swollen, this increases the risk of infection and further damage to the skin. Sometimes, intense itching, tingling, and burning occurs before signs of stasis dermatitis appear. You may find yourself scratching  the insides of your ankles or rubbing your ankles together before the rash appears. After healing, there are often brown spots on the affected skin. Treatment includes resting and elevating the affected leg above the level of the heart, if possible. Cortisone creams and ointments applied to the skin (topically) may be needed, as well as medicine to reduce swelling in the legs  (diuretics). Compression stockings or an elastic wrap may also be needed to reduce swelling. If there is an infection, antibiotic medicines may also be used. Your skin may react to topical medicine (sensitization). Your skin may react to ingredients in wool wax alcohols, fragrances, and even topical corticosteroids. Burow's solution wet packs applied for 30 minutes, 3 times daily, will help the weepy rash. Stop using the packs before your skin gets too dry. You can also use a mixture of 3 parts white vinegar to 1 quart water. Grease your legs daily with ointments, such as petroleum jelly, to fight dryness. Avoid scratching or injuring the affected area. Call your caregiver right away if your rash gets worse, if an ulcer forms, or if you have a fever or other severe symptoms. Document Released: 08/30/2004 Document Revised: 10/15/2011 Document Reviewed: 12/12/2009 [facility] Patient Information 2015 [place], [place]. This information is not intended to replace advice given to you by your health care provider. Make sure you discuss any questions you have with your health care provider.  Cellulitis is an infection of the skin and the tissue under the skin. The infected area is usually red and tender. This happens most often in the arms and lower legs. HOME CARE   Take your antibiotic medicine as told. Finish the medicine even if you start to feel better.  Keep the infected arm or leg raised (elevated).  Put a warm cloth on the area up to 4 times per day.  Only take medicines as told by your doctor.  Keep all doctor visits as told. GET HELP RIGHT AWAY IF:   You have a fever.  You feel very sleepy.  You throw up (vomit) or have watery poop (diarrhea).  You feel sick and have muscle aches and pains.  You see red streaks on the skin coming from the infected area.  Your red area gets bigger or turns a dark color.  Your bone or joint under the infected area is painful after the skin heals.  Your  infection comes back in the same area or different area.  You have a puffy (swollen) bump in the infected area.  You have new symptoms. MAKE SURE YOU:   Understand these instructions.  Will watch your condition.  Will get help right away if you are not doing well or get worse. Document Released: 01/09/2008 Document Revised: 01/22/2012 Document Reviewed: 10/08/2011 [facility] Patient Information 2015 [place], [place]. This information is not intended to replace advice given to you by your health care provider. Make sure you discuss any questions you have with your health care provider.

## 2014-01-28 NOTE — ED Notes (Signed)
2 weeks of right ankle/lower leg swelling, pain and redness

## 2014-01-29 ENCOUNTER — Encounter: Payer: Self-pay | Admitting: Internal Medicine

## 2014-01-29 ENCOUNTER — Ambulatory Visit (INDEPENDENT_AMBULATORY_CARE_PROVIDER_SITE_OTHER): Payer: Medicare HMO | Admitting: Internal Medicine

## 2014-01-29 ENCOUNTER — Other Ambulatory Visit (HOSPITAL_COMMUNITY): Payer: Self-pay | Admitting: Internal Medicine

## 2014-01-29 ENCOUNTER — Ambulatory Visit (HOSPITAL_COMMUNITY)
Admission: RE | Admit: 2014-01-29 | Discharge: 2014-01-29 | Disposition: A | Discharge status: Home / Self Care | Payer: Medicare HMO | Source: Ambulatory Visit | Attending: Internal Medicine | Admitting: Internal Medicine

## 2014-01-29 VITALS — BP 149/84 | HR 100 | Temp 96.9°F | Wt 316.7 lb

## 2014-01-29 DIAGNOSIS — L0390 Cellulitis, unspecified: Secondary | ICD-10-CM

## 2014-01-29 DIAGNOSIS — M7989 Other specified soft tissue disorders: Secondary | ICD-10-CM

## 2014-01-29 DIAGNOSIS — M79609 Pain in unspecified limb: Secondary | ICD-10-CM | POA: Insufficient documentation

## 2014-01-29 DIAGNOSIS — L03119 Cellulitis of unspecified part of limb: Secondary | ICD-10-CM

## 2014-01-29 DIAGNOSIS — IMO0002 Reserved for concepts with insufficient information to code with codable children: Secondary | ICD-10-CM | POA: Insufficient documentation

## 2014-01-29 DIAGNOSIS — L03115 Cellulitis of right lower limb: Secondary | ICD-10-CM | POA: Insufficient documentation

## 2014-01-29 DIAGNOSIS — M1710 Unilateral primary osteoarthritis, unspecified knee: Secondary | ICD-10-CM | POA: Insufficient documentation

## 2014-01-29 DIAGNOSIS — I10 Essential (primary) hypertension: Secondary | ICD-10-CM

## 2014-01-29 DIAGNOSIS — E669 Obesity, unspecified: Secondary | ICD-10-CM | POA: Insufficient documentation

## 2014-01-29 DIAGNOSIS — L02419 Cutaneous abscess of limb, unspecified: Secondary | ICD-10-CM

## 2014-01-29 NOTE — ED Provider Notes (Signed)
Medical screening examination/treatment/procedure(s) were performed by a resident physician or non-physician practitioner and as the supervising physician I was immediately available for consultation/collaboration.  Cissy Galbreath, MD    Selene Peltzer S Vinal Rosengrant, MD 01/29/14 0735 

## 2014-01-29 NOTE — Progress Notes (Signed)
Attending physician note: Presenting problems, physical findings, medications, discussed with resident physician Dr. Jerene Pitch and I concur with her management. Murriel Hopper, M.D., [place]

## 2014-01-29 NOTE — Progress Notes (Signed)
Subjective:   Patient ID: Erika [name] female   DOB: 1940/11/14 73 y.o.   MRN: [id number]  HPI: Ms.Kenzlie Lemmie Erika [name] is a 73 y.o. African American obese female with HTN and other PMH as listed below presents to opc today for acute visit with complaints of R ankle pain, swelling, and itching x2 weeks. Slightly improved over time but still swollen, painful on palpation, and itching with skin peeling of R ankle. Denies any trauma or injury to area or any bug bite. No prior episodes but does endorse occasional swelling of legs in the past. Has used compression stockings in the past but not well tolerated. No fever or chills. The ankle pain is improved with tramadol but 10/10 this morning, constant, and feels warm. Seen by UC yesterday with no leukocytosis, started on Keflex x10 days but has not picked up the prescription yet.   Past Medical History  Diagnosis Date  . Hypertension   . Tuberculosis     active TB treated in 2002, hx of paraspinal lumbar TB,   . Obesity     BMI 54  . Menopause   . Breast cyst     Excesion with FNA, begnin in 2004.   [name] Shingles   . Asthma   . SVT (supraventricular tachycardia) June 2009    one run while hospitalized  . Back pain     status post surgery 2002  . Stasis dermatitis     W/ LE edema, prviously on lasix now on mazxide.   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  . Lymphadenopathy     Of the mediastinum, Right side CXR 2008, not read on 2010 cxr.   [name] History of shingles     Recurrent with post herpetic neuralgia.   . Degenerative joint disease of spine     Imaging 2005,  Degenerative hypertrophic facet arthritis changes L4-5 and L5-S1..   . Anemia     with menses   Current Outpatient Prescriptions  Medication Sig Dispense Refill  . ADVAIR DISKUS 250-50 MCG/DOSE AEPB inhale 1 dose by mouth twice a day  60 each  3  . albuterol (PROAIR HFA) 108 (90 BASE) MCG/ACT inhaler INHALE 2 PUFFS INTO THE LUNGS EVERY 6 HOURS AS NEEDED FOR WHEEZING  8.5 g  6   . amLODipine (NORVASC) 10 MG tablet Take 1 tablet (10 mg total) by mouth daily.  90 tablet  2  . aspirin EC 81 MG tablet Take 81 mg by mouth daily.      . hydrochlorothiazide (MICROZIDE) 12.5 MG capsule Take 1 capsule (12.5 mg total) by mouth daily.  90 capsule  3  . omeprazole (PRILOSEC) 20 MG capsule Take 1 capsule (20 mg total) by mouth daily.  90 capsule  3  . traMADol (ULTRAM) 50 MG tablet Take 1 tablet (50 mg total) by mouth every 12 (twelve) hours as needed.  60 tablet  2  . cephALEXin (KEFLEX) 500 MG capsule Take 1 capsule (500 mg total) by mouth 4 (four) times daily.  40 capsule  0   No current facility-administered medications for this visit.   Family History  Problem Relation Age of Onset  . Stroke Mother     at young age  . Stroke Father     in 18's  . Heart attack Father   . Stroke Brother   . Stroke Brother    History   Social History  . Marital Status: Widowed    Spouse Name:  N/A    Number of Children: N/A  . Years of Education: N/A   Occupational History  . 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 volunteers at local school    Social History Main Topics  . Smoking status: Never Smoker   . Smokeless tobacco: Never Used  . Alcohol Use: No  . Drug Use: No  . Sexual Activity: No   Other Topics Concern  . Not on file   Social History Narrative   No regular exercise, retired, single, volunteers at a local school.    Review of Systems:  Constitutional:  Denies fever, chills.   HEENT:  Denies congestion  Respiratory:  DOE, mild wheezing  Cardiovascular:  Irregular heart beat  Gastrointestinal:  Denies nausea, vomiting, abdominal pain   Genitourinary:  Denies dysuria  Musculoskeletal:  R ankle edema and pain  Skin:  RLE discoloration   Neurological:  Denies syncope   Objective:  Physical Exam: Filed Vitals:   01/29/14 0931  BP: 149/84  Pulse: 100  Temp: 96.9 F (36.1 C)  TempSrc: Oral  Weight: 316 lb 11.2 oz (143.654 kg)  SpO2: 96%   Vitals  reviewed. General: sitting in chair, NAD HEENT: EOMI Cardiac: irregular rate Pulm: b/l mild expiratory wheezing Abd: soft, obese, BS present Ext:  +2DP B/L, RLE edema mostly in R ankle, darkened discoloration with erythema, tenderness to palpation, shiny and tight skin of RLE, skin peeling lateral surface of R ankle. ROM intact, mild warmth to palpation Neuro: alert and oriented X3, strength and sensation grossly intact  Assessment & Plan:  Discussed with Dr. Beryle Beams RLE edema and pain-- ?dvt, RLE duplex, already prescribed keflex by UC to start today

## 2014-01-29 NOTE — Assessment & Plan Note (Signed)
BP Readings from Last 3 Encounters:  01/29/14 149/84  01/28/14 163/79  08/31/13 151/90   Lab Results  Component Value Date   NA 138 02/09/2013   K 4.6 02/09/2013   CREATININE 0.93 02/09/2013   Assessment: Blood pressure control: mildly elevated Progress toward BP goal:  improved  Plan: Medications:  continue current medications amlodipine and hctz

## 2014-01-29 NOTE — Patient Instructions (Addendum)
General Instructions: Dear Ms. Ranta,  Please start your antibiotics, Keflex 1 tablet 4 times a day for total 10 days as prescribed by urgent care  Please get your RLE ultrasound study done to look for a possible blood clot  You can try rubbing over the counter cortisone cream on your ankle for itching relief  Please elevate your leg when at home and try compression stockings if you can tolerate them  If you notice worsening of the swelling or pain, fever, or chills, or if worsening SOB go directly to ER  If no improvement after 4-5 days of antibiotics call us and let us know as you may need to return for further evaluation, otherwise return to see Dr. Daryll Drown in 3-6 months on her next available  Please bring your medicines with you each time you come to clinic.  Medicines may include prescription medications, over-the-counter medications, herbal remedies, eye drops, vitamins, or other pills.  Progress Toward Treatment Goals:  Treatment Goal 01/29/2014  Blood pressure improved    Self Care Goals & Plans:  Self Care Goal 01/29/2014  Manage my medications take my medicines as prescribed; refill my medications on time; bring my medications to every visit  Monitor my health -  Eat healthy foods eat more vegetables; eat baked foods instead of fried foods; eat foods that are low in salt  Be physically active find an activity I enjoy    No flowsheet data found.  Care Management & Community Referrals:  Referral 04/13/2013  Referrals made for care management support none needed

## 2014-01-29 NOTE — Progress Notes (Signed)
Right lower extremity venous duplex completed.  Right:  No evidence of DVT, superficial thrombosis, or Baker's cyst.  Left:  Negative for DVT in the common femoral vein.  

## 2014-01-29 NOTE — Assessment & Plan Note (Addendum)
x2 weeks, mild improvement per patient but still painful and itching with calf tightness. Unilateral, sudden onset without obvious trauma, injury, or insect bite. Obese female but reports good mobility.   Differential include: RLE cellulitis but no clear cause, chronic venous stasis (however unilateral presentation), and DVT. Of note, she is on amlodipine that can cause lower extremity edema, however unilateral presentation makes it unlikely.   -RLE duplex today if possible--prelim negative -agree with Keflex as prescribed by urgent care -asked her to start abx today and if no improvement by day 4 or 5 of antibiotics to call us and let us know -also advised if sudden sob or chest pain, fever, chills, worsening of pain or swelling to notify us immediately and go to ER -elevate legs when resting -compression stockings if she can tolerate

## 2014-02-21 ENCOUNTER — Other Ambulatory Visit: Payer: Self-pay | Admitting: Internal Medicine

## 2014-03-10 ENCOUNTER — Ambulatory Visit (HOSPITAL_COMMUNITY)
Admission: RE | Admit: 2014-03-10 | Discharge: 2014-03-10 | Disposition: A | Discharge status: Home / Self Care | Payer: Medicare HMO | Source: Ambulatory Visit | Attending: Internal Medicine | Admitting: Internal Medicine

## 2014-03-10 ENCOUNTER — Ambulatory Visit (INDEPENDENT_AMBULATORY_CARE_PROVIDER_SITE_OTHER): Payer: Medicare HMO | Admitting: Internal Medicine

## 2014-03-10 ENCOUNTER — Encounter: Payer: Self-pay | Admitting: Internal Medicine

## 2014-03-10 VITALS — BP 144/89 | HR 106 | Temp 96.7°F | Ht 67.0 in | Wt 310.6 lb

## 2014-03-10 DIAGNOSIS — R000 Tachycardia, unspecified: Secondary | ICD-10-CM

## 2014-03-10 DIAGNOSIS — I499 Cardiac arrhythmia, unspecified: Secondary | ICD-10-CM

## 2014-03-10 DIAGNOSIS — M1710 Unilateral primary osteoarthritis, unspecified knee: Secondary | ICD-10-CM

## 2014-03-10 DIAGNOSIS — Z0000 Encounter for general adult medical examination without abnormal findings: Secondary | ICD-10-CM

## 2014-03-10 DIAGNOSIS — M1711 Unilateral primary osteoarthritis, right knee: Secondary | ICD-10-CM

## 2014-03-10 DIAGNOSIS — E785 Hyperlipidemia, unspecified: Secondary | ICD-10-CM

## 2014-03-10 DIAGNOSIS — I10 Essential (primary) hypertension: Secondary | ICD-10-CM

## 2014-03-10 DIAGNOSIS — K219 Gastro-esophageal reflux disease without esophagitis: Secondary | ICD-10-CM

## 2014-03-10 DIAGNOSIS — J45909 Unspecified asthma, uncomplicated: Secondary | ICD-10-CM

## 2014-03-10 LAB — BASIC METABOLIC PANEL WITH GFR
BUN: 19 mg/dL (ref 6–23)
CO2: 27 mEq/L (ref 19–32)
Calcium: 9.1 mg/dL (ref 8.4–10.5)
Chloride: 98 mEq/L (ref 96–112)
Creat: 0.89 mg/dL (ref 0.50–1.10)
GFR, EST AFRICAN AMERICAN: 75 mL/min
GFR, EST NON AFRICAN AMERICAN: 65 mL/min
GLUCOSE: 86 mg/dL (ref 70–99)
POTASSIUM: 3.4 meq/L — AB (ref 3.5–5.3)
Sodium: 138 mEq/L (ref 135–145)

## 2014-03-10 LAB — LIPID PANEL
CHOLESTEROL: 204 mg/dL — AB (ref 0–200)
HDL: 70 mg/dL (ref >39)
LDL CALC: 116 mg/dL — AB (ref 0–99)
TRIGLYCERIDES: 90 mg/dL (ref <150)
Total CHOL/HDL Ratio: 2.9 Ratio
VLDL: 18 mg/dL (ref 0–40)

## 2014-03-10 MED ORDER — TRAMADOL HCL 50 MG PO TABS: 50.0000 mg | ORAL_TABLET | Freq: Two times a day (BID) | ORAL | Status: DC | PRN

## 2014-03-10 NOTE — Assessment & Plan Note (Addendum)
DEXA - Nml in 2014 [facility] - Nml 03/13/13 Pneumo - done Flu - 04/13/13 Pap - nml in 2002, 2008, 2011, > 73yo old, no need to screen.  Colonoscopy in 2004: No adenomatous disease at that time per review, GI consult for repeat.

## 2014-03-10 NOTE — Assessment & Plan Note (Signed)
She is not currently on any medications.  At last check was 131 last year, will check again.

## 2014-03-10 NOTE — Assessment & Plan Note (Signed)
Controlled with omeprazole, continue.

## 2014-03-10 NOTE — Progress Notes (Signed)
Subjective:    Patient ID: Erika [name], female    DOB: 1941/06/01, 73 y.o.   MRN: [id number]  CC: Routine visit  HPI  Erika [name] is a 73yo woman with PMH of HLD, HTN, Asthma, shingles, PAC's, OA, GERD who presents for routine follow up.    She was last seen in June for cellulitis of her right leg, she was treated with keflex and an ultrasound of her LE was done showing no DVT.  Cellulitis is much improved, no further rash, no pain.  She has some mild LE edema on the right which is improving.    Today Erika [name] reports she is doing well, no complaints.    Reports her asthma is doing well, she is using her inhalers as needed.  Her knee pain is much better.  She has a history of HTN , initial BP was 144/89, recheck was 131/91.  She has a history of tachycardia, mostly PVCs from chart review, she has no chest pain or palpitations.  She further has a history of OA for which she takes tramadol.   We reviewed her medications.  She has completed her cephalexin.    Current Outpatient Prescriptions on File Prior to Visit  Medication Sig Dispense Refill  . ADVAIR DISKUS 250-50 MCG/DOSE AEPB inhale 1 dose by mouth twice a day  60 each  3  . albuterol (PROAIR HFA) 108 (90 BASE) MCG/ACT inhaler INHALE 2 PUFFS INTO THE LUNGS EVERY 6 HOURS AS NEEDED FOR WHEEZING  8.5 g  6  . amLODipine (NORVASC) 10 MG tablet take 1 tablet by mouth once daily  90 tablet  2  . aspirin EC 81 MG tablet Take 81 mg by mouth daily.      . hydrochlorothiazide (MICROZIDE) 12.5 MG capsule Take 1 capsule (12.5 mg total) by mouth daily.  90 capsule  3  . omeprazole (PRILOSEC) 20 MG capsule take 1 capsule by mouth once daily  90 capsule  3   No current facility-administered medications on file prior to visit.   Review of Systems  Constitutional: Negative for fever, chills and fatigue.  HENT: Negative for dental problem, ear discharge and ear pain.   Eyes: Negative for photophobia and visual disturbance.  Respiratory:  Positive for shortness of breath (with weather change, or air conditioning). Negative for cough and choking.   Cardiovascular: Negative for chest pain, palpitations and leg swelling.  Gastrointestinal: Negative for nausea, vomiting and abdominal pain.  Genitourinary: Negative for dysuria, frequency and difficulty urinating.  Musculoskeletal: Positive for arthralgias and gait problem (due to DJD). Negative for back pain and joint swelling.  Skin: Negative for rash and wound.  Neurological: Negative for dizziness, light-headedness and headaches.  Psychiatric/Behavioral: Negative for confusion and decreased concentration.       Objective:   Physical Exam  Constitutional: She is oriented to person, place, and time. She appears well-developed and well-nourished. No distress.  HENT:  Head: Normocephalic and atraumatic.  Mouth/Throat: No oropharyngeal exudate.  Eyes: Conjunctivae are normal. Pupils are equal, round, and reactive to light.  Neck: Neck supple.  Cardiovascular: S1 normal, S2 normal and normal pulses.  A regularly irregular rhythm present.  Occasional extrasystoles are present. Tachycardia present.  PMI is not displaced.  Exam reveals no friction rub.   No murmur heard. Pulmonary/Chest: Effort normal and breath sounds normal. No respiratory distress. She has no wheezes.  Abdominal: Soft. Bowel sounds are normal.  Musculoskeletal: She exhibits edema (mild non pitting on the right  to the ankle) and tenderness (mild, at site of right sided cellulitis).  Lymphadenopathy:    She has no cervical adenopathy.  Neurological: She is alert and oriented to person, place, and time.  Skin: Skin is warm and dry. No erythema.  Psychiatric: She has a normal mood and affect. Her behavior is normal.    BMET, Lipid panel today      Assessment & Plan:  RTC in 4 months

## 2014-03-10 NOTE — Patient Instructions (Signed)
General Instructions: Please schedule a follow up visit within the next 4 months.   For your medications:   Please bring all of your pill  Bottles with you to each visit.  This will help make sure that we have an up to date list of all the medications you are taking.  Please also bring any over the counter herbal medications you are taking (not including advil, tylenol, etc.)  Please continue taking all of your medications as prescribed.   You will have a referral to the GI doctors for possible colonoscopy to screen for colon cancer.  Please keep any appointment that will be made for you.    I have refilled your tramadol.   Thank you!    Treatment Goals:  Goals (1 Years of Data) as of 03/10/14   None      Progress Toward Treatment Goals:  Treatment Goal 03/10/2014  Blood pressure unchanged    Self Care Goals & Plans:  Self Care Goal 03/10/2014  Manage my medications take my medicines as prescribed; bring my medications to every visit; refill my medications on time  Monitor my health -  Eat healthy foods eat more vegetables; eat foods that are low in salt; drink diet soda or water instead of juice or soda  Be physically active find an activity I enjoy    No flowsheet data found.   Care Management & Community Referrals:  Referral 03/10/2014  Referrals made for care management support none needed

## 2014-03-10 NOTE — Assessment & Plan Note (Signed)
Stable, doing well on current inhalers. Continue to monitor.

## 2014-03-10 NOTE — Assessment & Plan Note (Signed)
She had some mild tachycardia when she came in and this improved as she rested in the clinic exam room.  She had some skipped beats on pulse exams.  EKG was done today and while on machine she had a few PVCs, but EKG was unchanged from previous.  There was some motion artifact.   Will continue to monitor, she is asymptomatic and I think she likely has PVCs which won't need treatment.

## 2014-03-10 NOTE — Assessment & Plan Note (Signed)
BP Readings from Last 3 Encounters:  03/10/14 144/89  01/29/14 149/84  01/28/14 163/79    Lab Results  Component Value Date   NA 138 02/09/2013   K 4.6 02/09/2013   CREATININE 0.93 02/09/2013    Assessment: Blood pressure control: mildly elevated Progress toward BP goal:  unchanged Comments: Recheck was at goal  Plan: Medications:  continue current medications, amlodipine, hctz Educational resources provided:   Self management tools provided:   Other plans: renal function stable, will check again today.

## 2014-03-10 NOTE — Assessment & Plan Note (Signed)
Improved with injections.  Refilled tramadol today.

## 2014-04-06 ENCOUNTER — Encounter: Payer: Self-pay | Admitting: *Deleted

## 2014-04-07 ENCOUNTER — Other Ambulatory Visit: Payer: Self-pay | Admitting: Internal Medicine

## 2014-04-19 NOTE — Progress Notes (Signed)
Patient ID: Erika [name], female   DOB: May 21, 1941, 73 y.o.   MRN: [id number] Called and spoke with Erika [name] re: mildly low K.  She will come in for repeat blood work.

## 2014-04-19 NOTE — Addendum Note (Signed)
Addended by: Gilles Chiquito B on: 04/19/2014 10:29 AM   Modules accepted: Orders

## 2014-04-28 ENCOUNTER — Telehealth: Payer: Self-pay | Admitting: *Deleted

## 2014-04-28 NOTE — Telephone Encounter (Signed)
CALLED PATIENT AND LVM FOR PATIENT TO CALL [facility]. THIS IS IN REGARDS TO HER GI REFERRAL. SHE WAS TO CONTACT DR Penelope Coop OFFICE. WE ARE UNABLE TO MAKE REFERRAL UNTIL THIS HAS BEEN TAKEN CARE OF.  NEEDS TO KNOW IF PATIENT CONTACTED THE OFFICE OR DO SHE WANT [place] TO CANCEL THIS REFERRAL.  Erika [name] NTII [id number]  10:04AM

## 2014-05-30 ENCOUNTER — Other Ambulatory Visit: Payer: Self-pay | Admitting: Internal Medicine

## 2014-06-04 ENCOUNTER — Other Ambulatory Visit: Payer: Self-pay | Admitting: Internal Medicine

## 2014-06-10 ENCOUNTER — Other Ambulatory Visit: Payer: Self-pay | Admitting: Internal Medicine

## 2014-06-14 ENCOUNTER — Ambulatory Visit (INDEPENDENT_AMBULATORY_CARE_PROVIDER_SITE_OTHER): Payer: Medicare HMO | Admitting: *Deleted

## 2014-06-14 ENCOUNTER — Ambulatory Visit: Payer: Medicare HMO | Admitting: *Deleted

## 2014-06-14 ENCOUNTER — Other Ambulatory Visit (INDEPENDENT_AMBULATORY_CARE_PROVIDER_SITE_OTHER): Payer: Medicare HMO

## 2014-06-14 DIAGNOSIS — Z23 Encounter for immunization: Secondary | ICD-10-CM

## 2014-06-14 DIAGNOSIS — I10 Essential (primary) hypertension: Secondary | ICD-10-CM

## 2014-06-14 LAB — BASIC METABOLIC PANEL WITH GFR
BUN: 15 mg/dL (ref 6–23)
CALCIUM: 8.6 mg/dL (ref 8.4–10.5)
CHLORIDE: 104 meq/L (ref 96–112)
CO2: 26 mEq/L (ref 19–32)
CREATININE: 0.93 mg/dL (ref 0.50–1.10)
GFR, Est African American: 72 mL/min
GFR, Est Non African American: 62 mL/min
GLUCOSE: 79 mg/dL (ref 70–99)
Potassium: 4.1 mEq/L (ref 3.5–5.3)
Sodium: 138 mEq/L (ref 135–145)

## 2014-07-07 ENCOUNTER — Encounter: Payer: Self-pay | Admitting: Internal Medicine

## 2014-07-07 ENCOUNTER — Ambulatory Visit (INDEPENDENT_AMBULATORY_CARE_PROVIDER_SITE_OTHER): Payer: Medicare HMO | Admitting: Internal Medicine

## 2014-07-07 VITALS — BP 126/75 | HR 104 | Temp 97.9°F | Ht 67.0 in | Wt 321.7 lb

## 2014-07-07 DIAGNOSIS — E785 Hyperlipidemia, unspecified: Secondary | ICD-10-CM

## 2014-07-07 DIAGNOSIS — K219 Gastro-esophageal reflux disease without esophagitis: Secondary | ICD-10-CM

## 2014-07-07 DIAGNOSIS — Z0000 Encounter for general adult medical examination without abnormal findings: Secondary | ICD-10-CM

## 2014-07-07 DIAGNOSIS — M1711 Unilateral primary osteoarthritis, right knee: Secondary | ICD-10-CM

## 2014-07-07 DIAGNOSIS — M479 Spondylosis, unspecified: Secondary | ICD-10-CM

## 2014-07-07 DIAGNOSIS — J4520 Mild intermittent asthma, uncomplicated: Secondary | ICD-10-CM

## 2014-07-07 DIAGNOSIS — I10 Essential (primary) hypertension: Secondary | ICD-10-CM

## 2014-07-07 NOTE — Assessment & Plan Note (Signed)
LDL 116, at goal of < 130 with diet alone.  Continue to monitor.

## 2014-07-07 NOTE — Patient Instructions (Signed)
General Instructions: Please schedule a follow up visit within the next 4 months.   For your medications:   Thank you for bringing in your medications!!  It helps [place] take better care of you!  Please start taking your hydrochlorothiazide (small capsule, blue and white) (blood pressure) daily and your omeprazole (white capsule for reflux) daily.   Have a happy holidays!    Call us if you need anything  Thank you!   Treatment Goals:  Goals (1 Years of Data) as of 07/07/14    None      Progress Toward Treatment Goals:  Treatment Goal 07/07/2014  Blood pressure unchanged    Self Care Goals & Plans:  Self Care Goal 07/07/2014  Manage my medications take my medicines as prescribed; bring my medications to every visit; refill my medications on time  Monitor my health -  Eat healthy foods eat more vegetables; eat foods that are low in salt  Be physically active find an activity I enjoy    No flowsheet data found.   Care Management & Community Referrals:  Referral 07/07/2014  Referrals made for care management support none needed

## 2014-07-07 NOTE — Progress Notes (Signed)
Subjective:    Patient ID: Erika [name], female    DOB: [birth year], 73 y.o.   MRN: [id number]  CC: Routine folloe up.   HPI  Erika [name] is a 73yo woman with PMH of HLD, HTN, irregular heart rhythm (PACs), asthma, osteoarthritis GERD, who presents for routine follow up.   Since being seen, she was supposed to see GI for colonoscopy, she had an FOBT in 03/2013 which was negative.  She reports that she owes money at Dr. Estell Harpin office and this has been difficult for her to afford.  She is planning to have the procedure done in the new year.    She has been having somewhat elevated blood pressures, elevated again in clinic this morning.  I reviewed her medications (she brought in her med bottles) and I noticed that she had inadvertently mixed omeprazole and HCTZ in the same pill bottle (they are both capsules).  I showed her the difference and advised her to separate them and start taking them both every day.  We will check her BP again at next visit.  Comfortingly, her BP on recheck was at goal.    She has a history of chronic back pain and knee pain.  She uses a cane.  She thinks that if she had a rolling walker with a seat, she would be able to exercise more b/c she would have a guaranteed place to rest.  I have placed an order.   She further carries the diagnoses of HLD, last LDL 116 on no medications, Asthma, which is well controlled, PACs with a chronically elevated HR (108 today) and I reviewed an old TTE and she apparently has Grade 1 Diastolic dysfunction.   We reviewed her medications as noted above and made corrections  Current Outpatient Prescriptions on File Prior to Visit  Medication Sig Dispense Refill  . ADVAIR DISKUS 250-50 MCG/DOSE AEPB inhale 1 dose by mouth twice a day 60 each 3  . amLODipine (NORVASC) 10 MG tablet take 1 tablet by mouth once daily 90 tablet 2  . aspirin EC 81 MG tablet Take 81 mg by mouth daily.    . hydrochlorothiazide (MICROZIDE) 12.5 MG capsule Take  1 capsule (12.5 mg total) by mouth daily. 90 capsule 3  . omeprazole (PRILOSEC) 20 MG capsule take 1 capsule by mouth once daily 90 capsule 3  . PROAIR HFA 108 (90 BASE) MCG/ACT inhaler inhale 2 puffs every 6 hours if needed 8.5 g 6  . traMADol (ULTRAM) 50 MG tablet Take 1 tablet (50 mg total) by mouth every 12 (twelve) hours as needed. 60 tablet 2   No current facility-administered medications on file prior to visit.    Review of Systems  Constitutional: Negative for chills, fatigue and unexpected weight change.  HENT: Negative for ear discharge and ear pain.   Eyes: Negative for photophobia and visual disturbance.  Respiratory: Negative for cough, choking and shortness of breath.   Cardiovascular: Negative for chest pain and palpitations.  Gastrointestinal: Negative for abdominal pain, constipation, blood in stool and abdominal distention.  Genitourinary: Negative for dysuria, enuresis and difficulty urinating.  Musculoskeletal: Positive for back pain, arthralgias and gait problem (difficulty walking due to pain).  Skin: Negative for rash and wound.  Neurological: Negative for dizziness, light-headedness and headaches.  Psychiatric/Behavioral: Negative for confusion and decreased concentration.       Objective:   Physical Exam  Constitutional: She is oriented to person, place, and time. No distress.  Obese  HENT:  Head: Normocephalic and atraumatic.  Eyes: Conjunctivae are normal. Pupils are equal, round, and reactive to light. No scleral icterus.  Neck: Neck supple.  Cardiovascular: Regular rhythm and normal heart sounds.   No murmur heard. Tachycardic  Pulmonary/Chest: Effort normal. No respiratory distress. She has wheezes (expiratory). She has no rales.  Abdominal: Soft. Bowel sounds are normal. She exhibits no distension.  Musculoskeletal: She exhibits tenderness (at lumbar paraspinous muscles and knees). She exhibits no edema.  Lymphadenopathy:    She has no cervical  adenopathy.  Neurological: She is alert and oriented to person, place, and time. Coordination normal.  Gait normal, using cane  Skin: Skin is warm and dry. She is not diaphoretic. No erythema.  Psychiatric: She has a normal mood and affect. Her behavior is normal.  Vitals reviewed.  No labs today     Assessment & Plan:  RTC in 4 months

## 2014-07-07 NOTE — Assessment & Plan Note (Signed)
Well controlled with advair and occasional albuterol.  She has some baseline wheezing that she states is normal for her when walking.  Improves with albuterol.

## 2014-07-07 NOTE — Assessment & Plan Note (Signed)
Continue omeprazole, separate out from HCTZ advised

## 2014-07-07 NOTE — Assessment & Plan Note (Signed)
Rx for Rolator walker.

## 2014-07-07 NOTE — Assessment & Plan Note (Signed)
DEXA - Nml in 2014 MMG - Nml 03/13/13 - due, advised to schedule Pneumo - 02/04/2008, discuss prevnar at next visit Flu - 06/14/2014 Pap - nml in 2002, 2008, 2011, > 71yo old, no need to screen.  Colon - due, advised to schedule with Dr. Penelope Coop

## 2014-07-07 NOTE — Assessment & Plan Note (Signed)
BP Readings from Last 3 Encounters:  07/07/14 126/75  03/10/14 144/89  01/29/14 149/84    Lab Results  Component Value Date   NA 138 06/14/2014   K 4.1 06/14/2014   CREATININE 0.93 06/14/2014    Assessment: Blood pressure control: controlled Progress toward BP goal:  at goal Comments: renal function stable at last check  Plan: Medications:  continue current medications, hctz, amlodipine Educational resources provided:   Self management tools provided:   Other plans: Separate out hctz from omeprazole

## 2014-09-08 ENCOUNTER — Other Ambulatory Visit: Payer: Self-pay | Admitting: Internal Medicine

## 2014-09-13 NOTE — Telephone Encounter (Signed)
Rx called in 

## 2014-10-25 ENCOUNTER — Other Ambulatory Visit: Payer: Self-pay | Admitting: *Deleted

## 2014-10-25 DIAGNOSIS — I10 Essential (primary) hypertension: Secondary | ICD-10-CM

## 2014-10-25 MED ORDER — HYDROCHLOROTHIAZIDE 12.5 MG PO CAPS: 12.5000 mg | ORAL_CAPSULE | Freq: Every day | ORAL | Status: DC

## 2014-11-01 ENCOUNTER — Other Ambulatory Visit: Payer: Self-pay | Admitting: Internal Medicine

## 2014-11-05 ENCOUNTER — Encounter: Payer: Self-pay | Admitting: Family Medicine

## 2014-11-05 ENCOUNTER — Ambulatory Visit (INDEPENDENT_AMBULATORY_CARE_PROVIDER_SITE_OTHER): Payer: Medicare HMO | Admitting: Family Medicine

## 2014-11-05 VITALS — BP 151/77 | Ht 67.0 in | Wt 300.0 lb

## 2014-11-05 DIAGNOSIS — M1711 Unilateral primary osteoarthritis, right knee: Secondary | ICD-10-CM

## 2014-11-05 MED ORDER — METHYLPREDNISOLONE ACETATE 40 MG/ML IJ SUSP
40.0000 mg | Freq: Once | INTRAMUSCULAR | Status: AC
Start: 2014-11-05 — End: 2014-11-05
  Administered 2014-11-05: 40 mg via INTRA_ARTICULAR

## 2014-11-05 NOTE — Assessment & Plan Note (Signed)
Corticosteroid injection of the right knee today. We'll see how she does.  Would like  to get some updated imaging in the near future. She'll follow-up when necessary.

## 2014-11-05 NOTE — Progress Notes (Signed)
Patient ID: Erika [name], female   DOB: Jul 05, 1941, 74 y.o.   MRN: [id number]  NARCISSUS DETWILER - 74 y.o. female MRN [id number]  Date of birth: [birth year]    SUBJECTIVE:     Right knee pain. Has previously had a corticosteroid injection into her left knee and it worked extremely well. Says she was able to get down the floor and do things. Left knee continues to do well. Over the last 2-3 months the right knee has begun to ache particularly with stair climbing. Occasional keeping her awake at night. ROS:     She's not noted any particular swelling, erythema or warmth of the right knee. She's had no unusual weight change fever, sweats, chills.  PERTINENT  PMH / PSH FH / / SH:  Past Medical, Surgical, Social, and Family History Reviewed & Updated in the EMR.  Pertinent findings include:  Obesity. No personal history diabetes mellitus. She does have remote history culture positive (paraspinal epidural )tuberculosis. She does not recall a specific treatment for this.  OBJECTIVE: BP 151/77 mmHg  Ht 5\' 7"  (1.702 m)  Wt 300 lb (136.079 kg)  BMI 46.98 kg/m2  Physical Exam:  Vital signs are reviewed. GEN.: Well-developed obese female no acute distress KNEES: Symmetrical. Neither knee has any erythema, no warmth, no skin lesions. She has full range of motion flexion extension. Right knee is ligaments intact to varus and valgus stress, normal Lock[place]. There is no effusion noted. Mild tenderness to palpation along the medial joint line, lateral joint line is nontender. Popliteal space is benign. Calf is soft. Distally neurovascular intact. IMAGING: The only imaging I have his of the left knee x-rays done in 2014 which show tricompartmental osteoarthritis. I have no imaging of the right knee.   INJECTION: Patient was given informed consent, signed copy in the chart. Appropriate time out was taken. Area prepped and draped in usual sterile fashion. 1 cc of methylprednisolone 40 mg/ml plus  4 cc of 1%  lidocaine without epinephrine was injected into the right knee using a(n) anterior medial approach. The patient tolerated the procedure well. There were no complications. Post procedure instructions were given.  ASSESSMENT & PLAN:  See problem based charting & AVS for pt instructions.

## 2014-11-09 ENCOUNTER — Telehealth: Payer: Self-pay | Admitting: Internal Medicine

## 2014-11-09 NOTE — Telephone Encounter (Signed)
Call to patient to confirm appointment for 11/10/14 at 9:45 lmtcb

## 2014-11-10 ENCOUNTER — Encounter: Payer: Self-pay | Admitting: Internal Medicine

## 2014-11-10 ENCOUNTER — Ambulatory Visit (INDEPENDENT_AMBULATORY_CARE_PROVIDER_SITE_OTHER): Payer: Medicare HMO | Admitting: Internal Medicine

## 2014-11-10 VITALS — BP 133/85 | HR 101 | Temp 97.5°F | Ht 67.0 in | Wt 320.5 lb

## 2014-11-10 DIAGNOSIS — Z23 Encounter for immunization: Secondary | ICD-10-CM

## 2014-11-10 DIAGNOSIS — M1711 Unilateral primary osteoarthritis, right knee: Secondary | ICD-10-CM

## 2014-11-10 DIAGNOSIS — J4520 Mild intermittent asthma, uncomplicated: Secondary | ICD-10-CM

## 2014-11-10 DIAGNOSIS — I10 Essential (primary) hypertension: Secondary | ICD-10-CM

## 2014-11-10 DIAGNOSIS — Z0000 Encounter for general adult medical examination without abnormal findings: Secondary | ICD-10-CM

## 2014-11-10 DIAGNOSIS — M179 Osteoarthritis of knee, unspecified: Secondary | ICD-10-CM

## 2014-11-10 DIAGNOSIS — J45909 Unspecified asthma, uncomplicated: Secondary | ICD-10-CM

## 2014-11-10 NOTE — Progress Notes (Signed)
Subjective:    Patient ID: Erika [name], female    DOB: Apr 18, 1941, 74 y.o.   MRN: [id number]  4 month follow up for HTN  HPI  Erika [name] is a 74yo woman with PMH of asthma, GERD, HTN, HLD, PACs, OA who presents for follow up of her HTN.   Erika [name] reports knee is better after injection with sports medicine.  She is walking with a cane with very little difficulty.  She notes that she knows she needs to lose weight, but it is difficult for her.    She tells me she thinks her allergies are acting up and that she plans to take OTC allergy medication during the spring.    Cramps in hands, stomach.  She is not very clear on the history as they come and go and do not seem associated with specific activity or times.  She notes that sometimes she might be texting on her phone when they come up.  She has started drinking more water and taking a spoonful of mustard when they come on and this helps.      She is due for colonoscopy, but cannot afford her bill at Dr. Estell Harpin office.  I will give her stool cards for now, advised her to follow up when she can with GI.    Erika [name] has a history of HTN.  She was initially elevated but returned to goal on recheck.  She is taking amlodipine and hctz without issue.  She further has asthma and is taking her inhalers as prescribed.  She denies smoking.    Meds reviewed and are listed below.     Current outpatient prescriptions:  .  ADVAIR DISKUS 250-50 MCG/DOSE AEPB, inhale 1 dose by mouth twice a day .  amLODipine (NORVASC) 10 MG tablet, take 1 tablet by mouth once daily .  aspirin EC 81 MG tablet, Take 81 mg by mouth daily .  hydrochlorothiazide (MICROZIDE) 12.5 MG capsule, Take 1 capsule (12.5 mg total) by mouth daily .  omeprazole (PRILOSEC) 20 MG capsule, take 1 capsule by mouth once daily .  PROAIR HFA 108 (90 BASE) MCG/ACT inhaler, inhale 2 puffs every 6 hours if needed .  traMADol (ULTRAM) 50 MG tablet, take 1 tablet by mouth every 12  hours if needed   Review of Systems  Constitutional: Negative for fever and chills.  HENT: Negative for congestion and rhinorrhea.   Eyes: Negative for photophobia and visual disturbance.  Respiratory: Negative for cough and shortness of breath.   Cardiovascular: Negative for chest pain and leg swelling.  Gastrointestinal: Negative for nausea, vomiting, abdominal pain and blood in stool.  Genitourinary: Negative for dysuria, enuresis and difficulty urinating.  Musculoskeletal: Positive for arthralgias (knee pain) and gait problem (cane use). Negative for joint swelling.  Skin: Negative for rash and wound.  Neurological: Negative for dizziness, weakness and light-headedness.  Psychiatric/Behavioral: Negative for confusion and decreased concentration.       Objective:   Physical Exam  Constitutional: She is oriented to person, place, and time. She appears well-developed and well-nourished. No distress.  Obese woman, NAD  HENT:  Head: Normocephalic and atraumatic.  Mouth/Throat: No oropharyngeal exudate.  Eyes: Conjunctivae are normal. No scleral icterus.  Neck: Neck supple.  Cardiovascular: Normal rate, regular rhythm and normal heart sounds.   No murmur heard. Pulmonary/Chest: Effort normal and breath sounds normal. No respiratory distress. She has no wheezes.  Abdominal: Soft. Bowel sounds are normal. There is no tenderness.  Musculoskeletal: She exhibits no edema or tenderness.  Lymphadenopathy:    She has no cervical adenopathy.  Neurological: She is alert and oriented to person, place, and time.  Skin: Skin is warm and dry. No rash noted. No erythema.  Psychiatric: She has a normal mood and affect. Her behavior is normal.  Vitals reviewed.   No labs today      Assessment & Plan:  RTC in 4 months

## 2014-11-10 NOTE — Assessment & Plan Note (Signed)
Doing much better after knee injections.  Will continue to monitor and she can see Sports Medicine PRN.

## 2014-11-10 NOTE — Assessment & Plan Note (Signed)
DEXA - Nml in 2014 MMG - Nml 03/13/13 - due, I placed a new referral and advised her to schedule Pneumo - 02/04/2008, prevnar today Flu - 06/14/2014 Pap - nml in 2002, 2008, 2011, > 72yo old, no need to screen.  Colon - due, advised to schedule with Dr. Penelope Coop.  Have sent her home with stool cards in the interim.

## 2014-11-10 NOTE — Assessment & Plan Note (Signed)
Well controlled today, no issue.  She is taking advair and albuterol without issue.

## 2014-11-10 NOTE — Patient Instructions (Signed)
General Instructions: Please schedule a follow up visit within the next 4 months.   For your medications:   Please bring all of your pill  Bottles with you to each visit.  This will help make sure that we have an up to date list of all the medications you are taking.  Please also bring any over the counter herbal medications you are taking (not including advil, tylenol, etc.)  Please continue taking your medications as prescribed  Please schedule your mammogram.  Please return stool cards to check for blood in your stool.   Thank you!   Treatment Goals:  Goals (1 Years of Data) as of 11/10/14    None      Progress Toward Treatment Goals:  Treatment Goal 11/10/2014  Blood pressure at goal    Self Care Goals & Plans:  Self Care Goal 11/10/2014  Manage my medications take my medicines as prescribed  Monitor my health keep track of my blood pressure  Eat healthy foods eat more vegetables; eat foods that are low in salt; eat baked foods instead of fried foods  Be physically active find an activity I enjoy; take a walk every day    No flowsheet data found.   Care Management & Community Referrals:  Referral 11/10/2014  Referrals made for care management support none needed

## 2014-11-10 NOTE — Assessment & Plan Note (Signed)
BP Readings from Last 3 Encounters:  11/10/14 133/85  11/05/14 151/77  07/07/14 126/75    Lab Results  Component Value Date   NA 138 06/14/2014   K 4.1 06/14/2014   CREATININE 0.93 06/14/2014    Assessment: Blood pressure control: controlled Progress toward BP goal:  at goal Comments: doing well  Plan: Medications:  continue current medications, amlodipine and hctz Educational resources provided:   Self management tools provided:   Other plans: Check renal function at next visit if needed

## 2014-11-11 ENCOUNTER — Ambulatory Visit (HOSPITAL_COMMUNITY)
Admission: RE | Admit: 2014-11-11 | Discharge: 2014-11-11 | Disposition: A | Discharge status: Home / Self Care | Payer: Medicare HMO | Source: Ambulatory Visit | Attending: Internal Medicine | Admitting: Internal Medicine

## 2014-11-11 DIAGNOSIS — Z0000 Encounter for general adult medical examination without abnormal findings: Secondary | ICD-10-CM

## 2014-11-11 DIAGNOSIS — Z1231 Encounter for screening mammogram for malignant neoplasm of breast: Secondary | ICD-10-CM | POA: Insufficient documentation

## 2015-02-19 ENCOUNTER — Other Ambulatory Visit: Payer: Self-pay | Admitting: Internal Medicine

## 2015-03-07 ENCOUNTER — Other Ambulatory Visit: Payer: Self-pay | Admitting: Internal Medicine

## 2015-03-08 NOTE — Telephone Encounter (Signed)
Called to pharm 

## 2015-05-11 ENCOUNTER — Other Ambulatory Visit: Payer: Self-pay | Admitting: Internal Medicine

## 2015-05-27 ENCOUNTER — Ambulatory Visit: Payer: Medicare HMO

## 2015-06-17 ENCOUNTER — Ambulatory Visit (INDEPENDENT_AMBULATORY_CARE_PROVIDER_SITE_OTHER): Payer: Medicare HMO | Admitting: *Deleted

## 2015-06-17 DIAGNOSIS — Z23 Encounter for immunization: Secondary | ICD-10-CM

## 2015-06-23 ENCOUNTER — Other Ambulatory Visit: Payer: Self-pay | Admitting: Internal Medicine

## 2015-06-23 DIAGNOSIS — J4530 Mild persistent asthma, uncomplicated: Secondary | ICD-10-CM

## 2015-07-27 ENCOUNTER — Other Ambulatory Visit: Payer: Self-pay | Admitting: Internal Medicine

## 2015-07-27 NOTE — Telephone Encounter (Signed)
She needs an appointment in 1-2 months.

## 2015-08-19 ENCOUNTER — Other Ambulatory Visit: Payer: Self-pay | Admitting: *Deleted

## 2015-08-21 MED ORDER — TRAMADOL HCL 50 MG PO TABS: 50.0000 mg | ORAL_TABLET | Freq: Two times a day (BID) | ORAL | Status: DC | PRN

## 2015-08-23 NOTE — Telephone Encounter (Signed)
Called to pharm 

## 2015-08-26 ENCOUNTER — Telehealth: Payer: Self-pay | Admitting: Internal Medicine

## 2015-08-26 NOTE — Telephone Encounter (Signed)
NEEDS REFILL ON TRAMADOL

## 2015-10-31 ENCOUNTER — Other Ambulatory Visit: Payer: Self-pay | Admitting: *Deleted

## 2015-10-31 DIAGNOSIS — I10 Essential (primary) hypertension: Secondary | ICD-10-CM

## 2015-10-31 MED ORDER — HYDROCHLOROTHIAZIDE 12.5 MG PO CAPS: 12.5000 mg | ORAL_CAPSULE | Freq: Every day | ORAL | Status: DC

## 2015-10-31 NOTE — Telephone Encounter (Signed)
Refill request from pt's pharmacy-last OV 11/20/2014, no upcoming visits scheduled. Will send appt request to front office and refill request to pcp to review.  Please advise.Despina Hidden Cassady3/27/201711:49 AM

## 2015-11-21 ENCOUNTER — Ambulatory Visit (INDEPENDENT_AMBULATORY_CARE_PROVIDER_SITE_OTHER): Payer: PPO | Admitting: Pulmonary Disease

## 2015-11-21 ENCOUNTER — Encounter: Payer: Self-pay | Admitting: Pulmonary Disease

## 2015-11-21 VITALS — BP 137/74 | HR 111 | Temp 97.7°F | Ht 67.0 in | Wt 316.8 lb

## 2015-11-21 DIAGNOSIS — Z0000 Encounter for general adult medical examination without abnormal findings: Secondary | ICD-10-CM | POA: Diagnosis not present

## 2015-11-21 DIAGNOSIS — I10 Essential (primary) hypertension: Secondary | ICD-10-CM

## 2015-11-21 DIAGNOSIS — R252 Cramp and spasm: Secondary | ICD-10-CM

## 2015-11-21 DIAGNOSIS — J4540 Moderate persistent asthma, uncomplicated: Secondary | ICD-10-CM | POA: Diagnosis not present

## 2015-11-21 MED ORDER — FLUTICASONE-SALMETEROL 500-50 MCG/DOSE IN AEPB: 1.0000 | INHALATION_SPRAY | Freq: Two times a day (BID) | RESPIRATORY_TRACT | Status: DC

## 2015-11-21 NOTE — Patient Instructions (Signed)
You may try over the counter Aspercreme, IcyHot, or Bengay (may use generic brand) for your hand cramps.

## 2015-11-21 NOTE — Progress Notes (Signed)
   Subjective:    Patient ID: Erika [name], female    DOB: [birth year], 75 y.o.   MRN: [id number]  HPI Erika [name] is a 75 year old woman with history of asthma, GERD, HTN, HLD, PACs, OA who presents for follow up of her HTN.  She has no complaints today. She is doing well overall.  She is still having her chronic hand cramps. They come and go. She reports they improve with mustard or vinegar. Otherwise she has to massage them until they resolve spontaneously.  Review of Systems Constitutional: no fevers/chills Respiratory: no shortness of breath Cardiovascular: no chest pain Gastrointestinal: no nausea/vomiting, no abdominal pain, no constipation, no diarrhea  Past Medical History  Diagnosis Date  . Hypertension   . Tuberculosis     active TB treated in 2002, hx of paraspinal lumbar TB,   . Obesity     BMI 54  . Menopause   . Breast cyst     Excesion with FNA, begnin in 2004.   [name] Shingles   . Asthma   . SVT (supraventricular tachycardia) [facility]) June 2009    one run while hospitalized  . Back pain     status post surgery 2002  . Stasis dermatitis     W/ LE edema, prviously on lasix now on mazxide.   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  . Lymphadenopathy     Of the mediastinum, Right side CXR 2008, not read on 2010 cxr.   [name] History of shingles     Recurrent with post herpetic neuralgia.   . Degenerative joint disease of spine     Imaging 2005,  Degenerative hypertrophic facet arthritis changes L4-5 and L5-S1..   . Anemia     with menses    Current Outpatient Prescriptions on File Prior to Visit  Medication Sig Dispense Refill  . ADVAIR DISKUS 250-50 MCG/DOSE AEPB inhale 1 dose by mouth twice a day 60 each 3  . albuterol (PROAIR HFA) 108 (90 BASE) MCG/ACT inhaler Inhale 1-2 puffs into the lungs every 6 (six) hours as needed for shortness of breath. 8.5 Inhaler 6  . amLODipine (NORVASC) 10 MG tablet take 1 tablet by mouth once daily 90 tablet 2  . aspirin  EC 81 MG tablet Take 81 mg by mouth daily.    . hydrochlorothiazide (MICROZIDE) 12.5 MG capsule Take 1 capsule (12.5 mg total) by mouth daily. 90 capsule 3  . omeprazole (PRILOSEC) 20 MG capsule take 1 capsule by mouth once daily 90 capsule 3  . traMADol (ULTRAM) 50 MG tablet Take 1 tablet (50 mg total) by mouth every 12 (twelve) hours as needed. 60 tablet 5   No current facility-administered medications on file prior to visit.    Objective:   Physical Exam Blood pressure 137/74, pulse 111, temperature 97.7 F (36.5 C), temperature source Oral, height 5\' 7"  (1.702 m), weight 316 lb 12.8 oz (143.7 kg), SpO2 98 %. General Apperance: NAD HEENT: Normocephalic, atraumatic, anicteric sclera Neck: Supple, trachea midline Lungs: Clear to auscultation bilaterally. No wheezes, rhonchi or rales. Breathing comfortably Heart: Regular rate and rhythm, no murmur/rub/gallop Abdomen: Soft, nontender, nondistended, no rebound/guarding Extremities: Warm and well perfused, no edema Skin: No rashes or lesions Neurologic: Alert and interactive. No gross deficits.    Assessment & Plan:  Please refer to problem based charting.

## 2015-11-22 DIAGNOSIS — R252 Cramp and spasm: Secondary | ICD-10-CM | POA: Insufficient documentation

## 2015-11-22 LAB — BMP8+ANION GAP
ANION GAP: 19 mmol/L — AB (ref 10.0–18.0)
BUN/Creatinine Ratio: 14 (ref 12–28)
BUN: 13 mg/dL (ref 8–27)
CALCIUM: 9.4 mg/dL (ref 8.7–10.3)
CO2: 25 mmol/L (ref 18–29)
Chloride: 96 mmol/L (ref 96–106)
Creatinine, Ser: 0.94 mg/dL (ref 0.57–1.00)
GFR calc Af Amer: 70 mL/min/{1.73_m2} (ref >59)
GFR calc non Af Amer: 61 mL/min/{1.73_m2} (ref >59)
Glucose: 89 mg/dL (ref 65–99)
POTASSIUM: 3.5 mmol/L (ref 3.5–5.2)
Sodium: 140 mmol/L (ref 134–144)

## 2015-11-22 NOTE — Progress Notes (Signed)
Case discussed with Dr. Krall soon after the resident saw the patient.  We reviewed the resident's history and exam and pertinent patient test results.  I agree with the assessment, diagnosis, and plan of care documented in the resident's note. 

## 2015-11-22 NOTE — Assessment & Plan Note (Signed)
MMG - Nml 11/11/2014 Colon - Unable to schedule with [facility] as she has a balance due and is unable to afford it. Reminded her to send in stool cards.

## 2015-11-22 NOTE — Assessment & Plan Note (Signed)
Assessment: She has been using albuterol at least once a day every day for the past week. She notices that she has to use her albuterol more often when she is outdoors or around smokers. No increased cough or sputum production.  Plan: -Will step up her therapy to Advair 500-50 mcg BID. She may be able to step down her therapy again in a few months when her environmental allergen load has decreased. -Continue albuterol 1-2 puff every 6 hours as needed.

## 2015-11-22 NOTE — Assessment & Plan Note (Signed)
BP Readings from Last 3 Encounters:  11/21/15 137/74  11/10/14 133/85  11/05/14 151/77    Lab Results  Component Value Date   NA 140 11/21/2015   K 3.5 11/21/2015   CREATININE 0.94 11/21/2015    Assessment: Blood pressure control: at goal  Plan: Medications:  Continue amlodipine 10mg  daily and HCTZ 12.5mg  daily.

## 2015-11-22 NOTE — Addendum Note (Signed)
Addended by: Oval Linsey D on: 11/22/2015 02:46 PM   Modules accepted: Level of Service

## 2015-11-22 NOTE — Assessment & Plan Note (Signed)
Assessment: Chronic in nature and self resolving episodes. No electrolyte abnormalities on BMP today.  Plan: She wanted to know if she could try Flexeril since it seems to work for her niece's hand cramps. Explained to her that the risks outweigh the benefits for her at this point. We can try other therapies in the meantime. Advised her to try over the counter cremes such as Aspercreme, Bengay, etc. Could possibly be medication side effect. May consider changing some of her antihypertensives if this continues to be an issue.

## 2015-11-23 ENCOUNTER — Encounter: Payer: Self-pay | Admitting: Sports Medicine

## 2015-11-23 ENCOUNTER — Ambulatory Visit (INDEPENDENT_AMBULATORY_CARE_PROVIDER_SITE_OTHER): Payer: PPO | Admitting: Sports Medicine

## 2015-11-23 VITALS — BP 142/84 | Ht 67.0 in | Wt 300.0 lb

## 2015-11-23 DIAGNOSIS — M1711 Unilateral primary osteoarthritis, right knee: Secondary | ICD-10-CM

## 2015-11-23 MED ORDER — METHYLPREDNISOLONE ACETATE 40 MG/ML IJ SUSP
40.0000 mg | Freq: Once | INTRAMUSCULAR | Status: AC
Start: 2015-11-23 — End: 2015-11-23
  Administered 2015-11-23: 40 mg via INTRA_ARTICULAR

## 2015-11-23 NOTE — Progress Notes (Signed)
   Subjective:    Patient ID: Erika [name], female    DOB: 1940/09/19, 75 y.o.   MRN: [id number]  HPI chief complaint: Right knee pain  Patient comes in today requesting a repeat cortisone injection into the right knee. She received an injection into the same knee one year ago with significant pain relief. Pain began return several weeks ago. It is identical in nature to what she has experienced previously. She denies any trauma. No fevers or chills. She is leaving for a trip to [facility] in May.    Review of Systems     Objective:   Physical Exam  Obese. No acute distress.  Right knee: Range of motion is 0-120. No effusion. She is tender to palpation along the medial joint line. Pain but no popping with McMurray's. 2+ patellofemoral crepitus. Neurovascularly intact distally. Walking with a limp and the assistance of a cane       Assessment & Plan:   Returning right knee pain secondary to DJD  Patient's right knee was injected today with cortisone. An anterior medial approach was utilized. A prescription for a new 4-prong cane was provided (one of the prongs on her current cane is missing a stopper). I had some difficulty getting into her knee joint using the anterior medial approach so I've agreed to reinject her knee a week prior to her trip to [facility] if needed. If that is the case, I would utilize an anterior lateral approach or inject under [place]. Otherwise, follow-up as needed.  Consent obtained and verified. Time-out conducted. Noted no overlying erythema, induration, or other signs of local infection. Skin prepped in a sterile fashion. Topical analgesic spray: Ethyl chloride. Joint: right knee Needle: 22g 1.5 inch Completed without difficulty. Meds: 3cc 1% xylocaine, 1cc (40mg ) depomedrol  Advised to call if fevers/chills, erythema, induration, drainage, or persistent bleeding.

## 2016-02-22 ENCOUNTER — Other Ambulatory Visit: Payer: Self-pay | Admitting: Internal Medicine

## 2016-02-22 ENCOUNTER — Encounter: Payer: Self-pay | Admitting: Internal Medicine

## 2016-02-22 ENCOUNTER — Ambulatory Visit (INDEPENDENT_AMBULATORY_CARE_PROVIDER_SITE_OTHER): Payer: PPO | Admitting: Internal Medicine

## 2016-02-22 VITALS — BP 153/83 | HR 102 | Temp 97.9°F | Ht 67.0 in | Wt 316.1 lb

## 2016-02-22 DIAGNOSIS — M479 Spondylosis, unspecified: Secondary | ICD-10-CM

## 2016-02-22 DIAGNOSIS — J4540 Moderate persistent asthma, uncomplicated: Secondary | ICD-10-CM

## 2016-02-22 DIAGNOSIS — Z9049 Acquired absence of other specified parts of digestive tract: Secondary | ICD-10-CM

## 2016-02-22 DIAGNOSIS — I10 Essential (primary) hypertension: Secondary | ICD-10-CM

## 2016-02-22 DIAGNOSIS — Z87898 Personal history of other specified conditions: Secondary | ICD-10-CM

## 2016-02-22 DIAGNOSIS — E785 Hyperlipidemia, unspecified: Secondary | ICD-10-CM | POA: Diagnosis not present

## 2016-02-22 DIAGNOSIS — K219 Gastro-esophageal reflux disease without esophagitis: Secondary | ICD-10-CM | POA: Diagnosis not present

## 2016-02-22 DIAGNOSIS — Z1231 Encounter for screening mammogram for malignant neoplasm of breast: Secondary | ICD-10-CM

## 2016-02-22 DIAGNOSIS — I491 Atrial premature depolarization: Secondary | ICD-10-CM

## 2016-02-22 DIAGNOSIS — G4719 Other hypersomnia: Secondary | ICD-10-CM

## 2016-02-22 DIAGNOSIS — M1711 Unilateral primary osteoarthritis, right knee: Secondary | ICD-10-CM

## 2016-02-22 DIAGNOSIS — M1710 Unilateral primary osteoarthritis, unspecified knee: Secondary | ICD-10-CM

## 2016-02-22 DIAGNOSIS — G4710 Hypersomnia, unspecified: Secondary | ICD-10-CM

## 2016-02-22 DIAGNOSIS — M7989 Other specified soft tissue disorders: Secondary | ICD-10-CM

## 2016-02-22 DIAGNOSIS — N6009 Solitary cyst of unspecified breast: Secondary | ICD-10-CM

## 2016-02-22 MED ORDER — PRAVASTATIN SODIUM 20 MG PO TABS: 20.0000 mg | ORAL_TABLET | Freq: Every day | ORAL | Status: DC

## 2016-02-22 MED ORDER — FUROSEMIDE 20 MG PO TABS: 20.0000 mg | ORAL_TABLET | Freq: Every day | ORAL | Status: DC | PRN

## 2016-02-22 NOTE — Progress Notes (Signed)
   Subjective:    Patient ID: Erika [name], female    DOB: 05/28/1941, 75 y.o.   MRN: [id number]  CC: 3 month follow up  HPI  [name] is a 75yo woman with PMH of HTN, GERD, HLD, Asthma, OA who presents for follow up.    [name] reports that she is doing well.  She notes that her feet are swollen.  She has not increased salt in her diet.  She is taking care of her great grandkids this summer and may be more active due to that.  She reports that it has been going on for about 1-2 weeks and sometimes feels tight and hurts.  The pain does not progress up her legs, she has no erythema or skin changes.  She is not sure if it gets better with rest.   We discussed her hyperlipidemia.  Based on her age and comorbidities, she has an ASCVD risk of 17.7%.  The ACC recommends a mod intensity statin.  We discussed this and she is agreeable to taking a medication.    She further notes daytime sleepiness, particularly when she is sitting in the chair and it is quiet.  She does not fall asleep while driving or during conversations.  She notes that she gets up at night when she cannot sleep and watches TV.  We discussed sleep journaling and some good sleep habits.    She reports that her asthma is well controlled.  No SOB, wheezing or chest pain.   She is not smoking.    Medications reviewed.   Review of Systems  Constitutional: Negative for activity change, appetite change and unexpected weight change.  Respiratory: Negative for cough and shortness of breath.   Cardiovascular: Positive for leg swelling. Negative for chest pain.  Gastrointestinal: Negative for diarrhea and constipation.  Musculoskeletal: Positive for arthralgias (knee OA) and gait problem (uses cane).  Neurological: Negative for dizziness, weakness and light-headedness.       Objective:   Physical Exam  Constitutional: She is oriented to person, place, and time. She appears well-developed and well-nourished. No distress.    Obese  HENT:  Head: Normocephalic and atraumatic.  Cardiovascular: Normal rate, regular rhythm and normal heart sounds.   No murmur heard. She has peripheral non pitting edema in the feet bilaterally.  Stops right at the ankles.  She has no calf tenderness or swelling of the legs. No skin changes.   Pulmonary/Chest: Effort normal and breath sounds normal. No respiratory distress. She has no wheezes.  Abdominal: Bowel sounds are normal.  Musculoskeletal: She exhibits edema. She exhibits no tenderness.  Neurological: She is alert and oriented to person, place, and time.  Psychiatric: She has a normal mood and affect. Her behavior is normal.    No labs today.       Assessment & Plan:  RTC in 4 months.

## 2016-02-22 NOTE — Patient Instructions (Addendum)
Erika [name] - -   Thank you for coming in to see me today.  Please make an appointment to follow up with me in 4 months.   For your leg swelling, please try lasix (furosemide), more information below.  You should take this only on days when the swelling is particularly bad.    For your daytime sleepiness, please keep a sleep journal for me to review.  Please document when you fall asleep at night, how many times you wake and how many hours you sleep. Also try to document when you fall asleep during the day and for how long.   For your cholesterol, I would recommend that you take pravastatin.  More information below.   Please call with any questions.      Furosemide tablets What is this medicine? FUROSEMIDE (fyoor [place] se mide) is a diuretic. It helps you make more urine and to lose salt and excess water from your body. This medicine is used to treat high blood pressure, and edema or swelling from heart, kidney, or liver disease. This medicine may be used for other purposes; ask your health care provider or pharmacist if you have questions. What should I tell my health care provider before I take this medicine? They need to know if you have any of these conditions: -abnormal blood electrolytes -diarrhea or vomiting -gout -heart disease -kidney disease, small amounts of urine, or difficulty passing urine -liver disease -thyroid disease -an unusual or allergic reaction to furosemide, sulfa drugs, other medicines, foods, dyes, or preservatives -pregnant or trying to get pregnant -breast-feeding How should I use this medicine? Take this medicine by mouth with a glass of water. Follow the directions on the prescription label. You may take this medicine with or without food. If it upsets your stomach, take it with food or milk. Do not take your medicine more often than directed. Remember that you will need to pass more urine after taking this medicine. Do not take your medicine at a time of  day that will cause you problems. Do not take at bedtime. Talk to your pediatrician regarding the use of this medicine in children. While this drug may be prescribed for selected conditions, precautions do apply. Overdosage: If you think you have taken too much of this medicine contact a poison control center or emergency room at once. NOTE: This medicine is only for you. Do not share this medicine with others. What if I miss a dose? If you miss a dose, take it as soon as you can. If it is almost time for your next dose, take only that dose. Do not take double or extra doses. What may interact with this medicine? -aspirin and aspirin-like medicines -certain antibiotics -chloral hydrate -cisplatin -cyclosporine -digoxin -diuretics -laxatives -lithium -medicines for blood pressure -medicines that relax muscles for surgery -methotrexate -NSAIDs, medicines for pain and inflammation like ibuprofen, naproxen, or indomethacin -phenytoin -steroid medicines like prednisone or cortisone -sucralfate -thyroid hormones This list may not describe all possible interactions. Give your health care provider a list of all the medicines, herbs, non-prescription drugs, or dietary supplements you use. Also tell them if you smoke, drink alcohol, or use illegal drugs. Some items may interact with your medicine. What should I watch for while using this medicine? Visit your doctor or health care professional for regular checks on your progress. Check your blood pressure regularly. Ask your doctor or health care professional what your blood pressure should be, and when you should contact him or  her. If you are a diabetic, check your blood sugar as directed. You may need to be on a special diet while taking this medicine. Check with your doctor. Also, ask how many glasses of fluid you need to drink a day. You must not get dehydrated. You may get drowsy or dizzy. Do not drive, use machinery, or do anything that needs  mental alertness until you know how this drug affects you. Do not stand or sit up quickly, especially if you are an older patient. This reduces the risk of dizzy or fainting spells. Alcohol can make you more drowsy and dizzy. Avoid alcoholic drinks. This medicine can make you more sensitive to the sun. Keep out of the sun. If you cannot avoid being in the sun, wear protective clothing and use sunscreen. Do not use sun lamps or tanning beds/booths. What side effects may I notice from receiving this medicine? Side effects that you should report to your doctor or health care professional as soon as possible: -blood in urine or stools -dry mouth -fever or chills -hearing loss or ringing in the ears -irregular heartbeat -muscle pain or weakness, cramps -skin rash -stomach upset, pain, or nausea -tingling or numbness in the hands or feet -unusually weak or tired -vomiting or diarrhea -yellowing of the eyes or skin Side effects that usually do not require medical attention (report to your doctor or health care professional if they continue or are bothersome): -headache -loss of appetite -unusual bleeding or bruising This list may not describe all possible side effects. Call your doctor for medical advice about side effects. You may report side effects to FDA at 1-800-FDA-1088. Where should I keep my medicine? Keep out of the reach of children. Store at room temperature between 15 and 30 degrees C (59 and 86 degrees F). Protect from light. Throw away any unused medicine after the expiration date. NOTE: This sheet is a summary. It may not cover all possible information. If you have questions about this medicine, talk to your doctor, pharmacist, or health care provider.    2016, Elsevier/Gold Standard. (2014-10-13 13:49:50)  Pravastatin tablets What is this medicine? PRAVASTATIN (PRA va stat in) is known as a HMG-CoA reductase inhibitor or 'statin'. It lowers the level of cholesterol and  triglycerides in the blood. This drug may also reduce the risk of heart attack, stroke, or other health problems in patients with risk factors for heart disease. Diet and lifestyle changes are often used with this drug. This medicine may be used for other purposes; ask your health care provider or pharmacist if you have questions. What should I tell my health care provider before I take this medicine? They need to know if you have any of these conditions: -frequently drink alcoholic beverages -kidney disease -liver disease -muscle aches or weakness -other medical condition -an unusual or allergic reaction to pravastatin, other medicines, foods, dyes, or preservatives -pregnant or trying to get pregnant -breast-feeding How should I use this medicine? Take pravastatin tablets by mouth. Swallow the tablets with a drink of water. Pravastatin can be taken at anytime of the day, with or without food. Follow the directions on the prescription label. Take your doses at regular intervals. Do not take your medicine more often than directed. Talk to your pediatrician regarding the use of this medicine in children. Special care may be needed. Pravastatin has been used in children as young as 69 years of age. Overdosage: If you think you have taken too much of this medicine contact  a poison control center or emergency room at once. NOTE: This medicine is only for you. Do not share this medicine with others. What if I miss a dose? If you miss a dose, take it as soon as you can. If it is almost time for your next dose, take only that dose. Do not take double or extra doses. What may interact with this medicine? Do not take this medicine with any of the following medications: -herbal medicines such as red yeast rice This medicine may also interact with the following medications: -alcohol -antiviral medicines for HIV or AIDS -certain medicines for fungal infections like ketoconazole and  itraconazole -colchicine -cyclosporine -other medicines for high cholesterol -some antibiotics like clarithromycin, erythromycin, and telithromycin This list may not describe all possible interactions. Give your health care provider a list of all the medicines, herbs, non-prescription drugs, or dietary supplements you use. Also tell them if you smoke, drink alcohol, or use illegal drugs. Some items may interact with your medicine. What should I watch for while using this medicine? Visit your doctor or health care professional for regular check-ups. You may need regular tests to make sure your liver is working properly. Tell your doctor or health care professional right away if you get any unexplained muscle pain, tenderness, or weakness, especially if you also have a fever and tiredness. Your doctor or health care professional may tell you to stop taking this medicine if you develop muscle problems. If your muscle problems do not go away after stopping this medicine, contact your health care professional. This drug is only part of a total heart-health program. Your doctor or a dietician can suggest a low-cholesterol and low-fat diet to help. Avoid alcohol and smoking, and keep a proper exercise schedule. Do not use this drug if you are pregnant or breast-feeding. Serious side effects to an unborn child or to an infant are possible. Talk to your doctor or pharmacist for more information. This medicine may affect blood sugar levels. If you have diabetes, check with your doctor or health care professional before you change your diet or the dose of your diabetic medicine. If you are going to have surgery tell your health care professional that you are taking this drug. What side effects may I notice from receiving this medicine? Side effects that you should report to your doctor or health care professional as soon as possible: -allergic reactions like skin rash, itching or hives, swelling of the face,  lips, or tongue -dark urine -fever -muscle pain, cramps, or weakness -redness, blistering, peeling or loosening of the skin, including inside the mouth -trouble passing urine or change in the amount of urine -unusually weak or tired -yellowing of the eyes or skin Side effects that usually do not require medical attention (report to your doctor or health care professional if they continue or are bothersome): -gas -headache -heartburn -indigestion -stomach pain This list may not describe all possible side effects. Call your doctor for medical advice about side effects. You may report side effects to FDA at 1-800-FDA-1088. Where should I keep my medicine? Keep out of the reach of children. Store at room temperature between 15 to 30 degrees C (59 to 86 degrees F). Protect from light. Keep container tightly closed. Throw away any unused medicine after the expiration date. NOTE: This sheet is a summary. It may not cover all possible information. If you have questions about this medicine, talk to your doctor, pharmacist, or health care provider.    2016, Elsevier/Gold  Standard. (2011-06-12 10:39:37)

## 2016-02-23 DIAGNOSIS — M7989 Other specified soft tissue disorders: Secondary | ICD-10-CM | POA: Insufficient documentation

## 2016-02-23 DIAGNOSIS — G4719 Other hypersomnia: Secondary | ICD-10-CM | POA: Insufficient documentation

## 2016-02-23 NOTE — Assessment & Plan Note (Addendum)
She is taking omeprazole without issue.  We discussed starting to wean this medication down today, however, she is not interested today. Consider weaning to every other day at next visit.   Plan Continue omeprazole 20mg  qdaily.

## 2016-02-23 NOTE — Assessment & Plan Note (Signed)
LDL last checked in 2015 was 116.  ASCVD risk was noted to be 17.7% given this finding.  The recommendation would be for a moderate intensity statin which was started today.   Plan Start pravastatin Check lipid profile at next visit.

## 2016-02-23 NOTE — Assessment & Plan Note (Signed)
This was from 2001.  Her most recent Mammogram from 2016 did not show any concerning lesions.   Plan Continue periodic mammogram screening.  Currently she is screening q yearly.

## 2016-02-23 NOTE — Assessment & Plan Note (Signed)
She has occasional back pains and knee pains.  She is taking tramadol with good results.   Plan Continue tramadol 50mg  q 12 hours prn pain.

## 2016-02-23 NOTE — Assessment & Plan Note (Signed)
Doing well today, no acute complaints.  Notes that breathing is very well controlled.  She is taking her advair daily and has not needed albuterol.   Plan Continue advair and PRN albuterol.

## 2016-02-23 NOTE — Assessment & Plan Note (Addendum)
BP today was 153.83 on recheck systolic was in the [id number] per nursing report. Renal function stable at last check in April.   Plan Continue amlodipine and hctz.

## 2016-02-23 NOTE — Assessment & Plan Note (Signed)
Review of chart showed an EKG in 2012 with PACs.  TTE at that time showed only grade 1 diastolic CHF.  Recent EKG in 2015 was NSR.   Plan Continue aspirin Monitor for change in symptoms. She currently has no symptoms concerning for an irregular heart rhythm.

## 2016-02-23 NOTE — Assessment & Plan Note (Signed)
She has no abdominal symptoms noted.  No issues today.

## 2016-02-23 NOTE — Assessment & Plan Note (Signed)
She notes frequent wakenings and watching TV at night when she is awake.  Her only time of sleepiness is in a quiet room.    Plan Sleep journal Sleep hygeine.

## 2016-02-23 NOTE — Assessment & Plan Note (Signed)
She reports that this is new, but she is on lasix.  I think that maybe she is more active taking care of her great grandkids and possibly dietary indiscretions (higher salt).    Plan Trial of lasix 20mg  PRN for swelling.  She will call if this doesn't helps.   She has no red flags for DVT or wounds today She also has a history of dCHF.  There is a chance that her heart failure has worsened.   If no improvement with PRN lasix, consider repeat TTE.

## 2016-02-29 ENCOUNTER — Ambulatory Visit
Admission: RE | Admit: 2016-02-29 | Discharge: 2016-02-29 | Disposition: A | Discharge status: Home / Self Care | Payer: PPO | Source: Ambulatory Visit | Attending: Internal Medicine | Admitting: Internal Medicine

## 2016-02-29 DIAGNOSIS — Z1231 Encounter for screening mammogram for malignant neoplasm of breast: Secondary | ICD-10-CM

## 2016-03-08 ENCOUNTER — Other Ambulatory Visit: Payer: Self-pay | Admitting: Internal Medicine

## 2016-03-08 DIAGNOSIS — K219 Gastro-esophageal reflux disease without esophagitis: Secondary | ICD-10-CM

## 2016-03-08 NOTE — Telephone Encounter (Signed)
Called to pharmacy 

## 2016-03-08 NOTE — Telephone Encounter (Signed)
Last appt 02/22/16; no f/u appt scheduled.

## 2016-03-08 NOTE — Telephone Encounter (Signed)
It appears at the last visit the plan was to consider weaning PPI at the next visit.  I therefore only prescribed 30 days worth with three refills as the prescription may be changed at follow-up with her PCP.

## 2016-03-08 NOTE — Telephone Encounter (Signed)
[place] Controlled Substance Database was reviewed and the last refill of the tramadol was #60 on 01/21/2016.  Thus, she is due for another refill.  I will refill once, but any further refills will need to be approved by her PCP.

## 2016-03-26 ENCOUNTER — Other Ambulatory Visit: Payer: Self-pay | Admitting: Internal Medicine

## 2016-03-27 MED ORDER — FLUTICASONE-SALMETEROL 500-50 MCG/DOSE IN AEPB: 1.0000 | INHALATION_SPRAY | Freq: Two times a day (BID) | RESPIRATORY_TRACT | 0 refills | Status: DC

## 2016-04-14 ENCOUNTER — Other Ambulatory Visit: Payer: Self-pay | Admitting: Internal Medicine

## 2016-04-17 ENCOUNTER — Ambulatory Visit (INDEPENDENT_AMBULATORY_CARE_PROVIDER_SITE_OTHER): Payer: PPO | Admitting: Internal Medicine

## 2016-04-17 ENCOUNTER — Encounter: Payer: Self-pay | Admitting: Internal Medicine

## 2016-04-17 ENCOUNTER — Ambulatory Visit (HOSPITAL_COMMUNITY)
Admission: RE | Admit: 2016-04-17 | Discharge: 2016-04-17 | Disposition: A | Discharge status: Home / Self Care | Payer: PPO | Source: Ambulatory Visit | Attending: Internal Medicine | Admitting: Internal Medicine

## 2016-04-17 VITALS — BP 146/76 | HR 97 | Temp 97.9°F | Wt 313.3 lb

## 2016-04-17 DIAGNOSIS — M79662 Pain in left lower leg: Secondary | ICD-10-CM | POA: Insufficient documentation

## 2016-04-17 DIAGNOSIS — R600 Localized edema: Secondary | ICD-10-CM

## 2016-04-17 DIAGNOSIS — M7989 Other specified soft tissue disorders: Secondary | ICD-10-CM | POA: Diagnosis not present

## 2016-04-17 DIAGNOSIS — I10 Essential (primary) hypertension: Secondary | ICD-10-CM

## 2016-04-17 DIAGNOSIS — M19072 Primary osteoarthritis, left ankle and foot: Secondary | ICD-10-CM | POA: Diagnosis not present

## 2016-04-17 DIAGNOSIS — Z0000 Encounter for general adult medical examination without abnormal findings: Secondary | ICD-10-CM

## 2016-04-17 DIAGNOSIS — M2142 Flat foot [pes planus] (acquired), left foot: Secondary | ICD-10-CM | POA: Diagnosis not present

## 2016-04-17 MED ORDER — IBUPROFEN 600 MG PO TABS
600.0000 mg | ORAL_TABLET | Freq: Four times a day (QID) | ORAL | 2 refills | Status: AC | PRN
Start: 2016-04-17 — End: 2017-04-17

## 2016-04-17 NOTE — Patient Instructions (Signed)
It was a pleasure taking care of you today. Your left leg Ultrasound does not show any clots. Your pain might be due to flareup of arthritis that left ankle. We will do an x-ray showed left foot. You can use ibuprofen 600 mg every 6 hourly for pain if needed. You can also try using some ice, elevating your foot, and doing some stretching exercises to see if that helps. Please call us if you do not see any improvement in next 2 weeks.

## 2016-04-17 NOTE — Assessment & Plan Note (Signed)
Flu shot was given today 

## 2016-04-17 NOTE — Assessment & Plan Note (Signed)
She has unilateral left-sided leg and ankle nonpitting edema with some  discoloration and thickening of skin. There was no temperature difference between both limbs. Although she does not have any tenderness at her calf area, no history of recent travel or immobilization for a long time. She has low probability for DVT but we've ruled it out by getting a negative Doppler ultrasound of left lower extremity. -She might be having some flareup of osteoarthritis. We will do an x-ray of her left foot and ankle today. We gave her a prescription of ibuprofen 600 mg every 6 hourly for pain. She was told to raise her foot, can apply ice or rubbing medicine to see if that helps. She can call us if these measures fail to improve her symptoms. -She is at risk of developing cellulitis due to her edema, although no sign of infection currently. She was told to give [place] a call if she develops any fever or any worsening symptoms.

## 2016-04-17 NOTE — Progress Notes (Signed)
Preliminary results by tech - Venous Duplex Left Lower Ext. Completed. No evidence of deep vein thrombosis in the left leg. Oda Cogan, BS, RDMS, RVT

## 2016-04-17 NOTE — Assessment & Plan Note (Signed)
BP Readings from Last 3 Encounters:  04/17/16 (!) 146/76  02/22/16 (!) 153/83  11/23/15 (!) 142/84   Continue with current management.

## 2016-04-17 NOTE — Progress Notes (Signed)
   CC: Pain and swelling around her left ankle.  HPI:  Erika [name] is a 75 y.o. with PMH as listed below, in the clinic with complaint of swelling and pain around her left ankle for about 2 weeks. She was seen in clinic on July 19 and had bilateral lower extremity edema. She had an history of grade 1 diastolic heart failure on echo done in 2012. She was given Lasix 20 mg daily and that improved her right foot swelling but she is still had edema of left foot extending up to her mid calf. She complains of tenderness and pain around her medial malleolus. She states that she is unable to walk without limping because of that pain. She says that pain was initially worse around her lateral malleolus but now swelling and pain of that region has subsides and she started getting more pain around medial malleolus. She has noticed some  skin thickness due to that swelling and some dryness after the swelling subsides from her lateral side. She denies any restricted range of motion, pain in her legs, fever and chills.   Past Medical History:  Diagnosis Date  . Anemia    with menses  . Asthma   . Back pain    status post surgery 2002  . Breast cyst    Excesion with FNA, begnin in 2004.   . Degenerative joint disease of spine    Imaging 2005,  Degenerative hypertrophic facet arthritis changes L4-5 and L5-S1..   . History of shingles    Recurrent with post herpetic neuralgia.   [name] Hypertension   . Lymphadenopathy    Of the mediastinum, Right side CXR 2008, not read on 2010 cxr.   . Menopause   . Obesity    BMI 54  . 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  . Shingles   . Stasis dermatitis    W/ LE edema, prviously on lasix now on mazxide.   . SVT (supraventricular tachycardia) [facility]) June 2009   one run while hospitalized  . Tuberculosis    active TB treated in 2002, hx of paraspinal lumbar TB,     Review of Systems:  As per HPI.  Physical Exam:  Vitals:   04/17/16 1359  BP: (!)  146/76  Pulse: 97  Temp: 97.9 F (36.6 C)  TempSrc: Oral  SpO2: 100%  Weight: (!) 313 lb 4.8 oz (142.1 kg)   Gen. well-built, well-developed, obese lady, NAD. Extremities. There is nonpitting edema on the left foot, around the ankle extending in her calf below knee. There are some discoloration and thickening of skin around her left ankle and lower leg. Her left leg circumference 15 cm below the patella was 5 cm more than right. There was no temperature difference noted between both legs. There was tenderness behind her medial malleolus. There was a dry skin around her lateral malleolus. No tenderness on the back of her leg.   No varicose veins around the ankle. Peripheral pulses 2+ bilaterally. Lungs. Clear bilaterally, no added sounds. CV.RRR, No R/M/G. Abdomen. Soft and nontender, obese, bowel sounds positive.  Assessment & Plan:   See Encounters Tab for problem based charting.  Patient seen with Dr. Eppie Gibson

## 2016-04-17 NOTE — Progress Notes (Signed)
I saw and evaluated the patient.  I personally confirmed the key portions of Dr. Amin's history and exam and reviewed pertinent patient test results.  The assessment, diagnosis, and plan were formulated together and I agree with the documentation in the resident's note. 

## 2016-04-18 NOTE — Telephone Encounter (Signed)
Phoned into pharmacy.Regenia Skeeter, Ela Moffat Cassady9/13/201710:09 AM

## 2016-05-03 ENCOUNTER — Other Ambulatory Visit: Payer: Self-pay | Admitting: Internal Medicine

## 2016-05-14 ENCOUNTER — Other Ambulatory Visit: Payer: Self-pay | Admitting: Internal Medicine

## 2016-06-13 ENCOUNTER — Other Ambulatory Visit: Payer: Self-pay | Admitting: Internal Medicine

## 2016-06-13 MED ORDER — TRAMADOL HCL 50 MG PO TABS: ORAL_TABLET | ORAL | 5 refills | Status: DC

## 2016-06-13 NOTE — Telephone Encounter (Signed)
Rx written 04/16/16. Last appt 04/17/16; next appt 06/20/16.

## 2016-06-13 NOTE — Telephone Encounter (Signed)
Tramadol refill

## 2016-06-14 NOTE — Telephone Encounter (Signed)
Tramadol rx called to [facility]. 

## 2016-06-20 ENCOUNTER — Ambulatory Visit (INDEPENDENT_AMBULATORY_CARE_PROVIDER_SITE_OTHER): Payer: PPO | Admitting: Internal Medicine

## 2016-06-20 ENCOUNTER — Encounter: Payer: Self-pay | Admitting: Internal Medicine

## 2016-06-20 DIAGNOSIS — I10 Essential (primary) hypertension: Secondary | ICD-10-CM | POA: Diagnosis not present

## 2016-06-20 DIAGNOSIS — E7800 Pure hypercholesterolemia, unspecified: Secondary | ICD-10-CM

## 2016-06-20 DIAGNOSIS — Z6841 Body Mass Index (BMI) 40.0 and over, adult: Secondary | ICD-10-CM

## 2016-06-20 DIAGNOSIS — M7989 Other specified soft tissue disorders: Secondary | ICD-10-CM

## 2016-06-20 DIAGNOSIS — E785 Hyperlipidemia, unspecified: Secondary | ICD-10-CM

## 2016-06-20 DIAGNOSIS — Z79899 Other long term (current) drug therapy: Secondary | ICD-10-CM

## 2016-06-20 DIAGNOSIS — G4719 Other hypersomnia: Secondary | ICD-10-CM

## 2016-06-20 DIAGNOSIS — G4710 Hypersomnia, unspecified: Secondary | ICD-10-CM | POA: Diagnosis not present

## 2016-06-20 DIAGNOSIS — Z0000 Encounter for general adult medical examination without abnormal findings: Secondary | ICD-10-CM

## 2016-06-20 MED ORDER — HYDROCHLOROTHIAZIDE 25 MG PO TABS: 25.0000 mg | ORAL_TABLET | Freq: Every day | ORAL | 3 refills | Status: DC

## 2016-06-20 NOTE — Patient Instructions (Addendum)
Erika [name] - -   You are doing well today.  Keep taking your medications as prescribed.    For your colon cancer screening, please perform the stool cards that will be sent home with you today.   For your blood pressure, please start taking hydrochlorothiazide 25mg  daily.  This will be 2 pills of what you have now.  I have sent in a new prescription for the correct dose.  At your next refill start taking the new higher dose.   Thank you!  Please call with questions.

## 2016-06-20 NOTE — Progress Notes (Signed)
   Subjective:    Patient ID: Erika [name], female    DOB: 20-Apr-1941, 75 y.o.   MRN: [id number]  CC: 6 month follow up for HTN, swelling  HPI   Erika [name] is a 75yo woman with PMH of LE swelling (last seen in clinic with Dr. Reesa Chew in September), HTN, HLD, GERD, OA of knee and spine who presents for follow up.    Doing well, swelling and rash better in her left lower extremity.  Unclear what caused it.  Swelling better with rest, occasionally better with lasix.  She takes the lasix every other day and does feel like it helps her.  I reviewed Dr. Latina Craver note from September, doppler was negative.   Denies falls, she uses a 4 point cane with good results to get around.    She continues to have daytime sleepiness but attributes it to poor sleep hygeine.   Wants to do exercise program in [place]  She brought in her medications and we reviewed them.    Review of Systems  Constitutional: Negative for activity change, appetite change and fatigue.  Respiratory: Negative for cough, choking and shortness of breath.   Cardiovascular: Positive for leg swelling. Negative for chest pain.  Gastrointestinal: Negative for blood in stool, constipation and diarrhea.  Genitourinary: Negative for difficulty urinating and dysuria.  Musculoskeletal: Positive for arthralgias and back pain.  Skin: Negative for rash.  Neurological: Negative for dizziness and light-headedness.       Objective:   Physical Exam  Constitutional: She is oriented to person, place, and time.  Obese woman, NAD  HENT:  Head: Normocephalic and atraumatic.  Cardiovascular: Normal rate, regular rhythm and normal heart sounds.   No murmur heard. Pulmonary/Chest: Effort normal and breath sounds normal. No respiratory distress.  Musculoskeletal: She exhibits edema (non pitting, left > right.  No rash or wound noted). She exhibits no tenderness.  Neurological: She is alert and oriented to person, place, and time.  Psychiatric: She  has a normal mood and affect. Her behavior is normal.  Vitals reviewed.   Lipid panel and BMET today.  Stool cards.     Assessment & Plan:  RTC in 3 months

## 2016-06-21 ENCOUNTER — Encounter: Payer: Self-pay | Admitting: Internal Medicine

## 2016-06-21 LAB — BMP8+ANION GAP
Anion Gap: 17 mmol/L (ref 10.0–18.0)
BUN / CREAT RATIO: 19 (ref 12–28)
BUN: 17 mg/dL (ref 8–27)
CHLORIDE: 97 mmol/L (ref 96–106)
CO2: 28 mmol/L (ref 18–29)
Calcium: 9.6 mg/dL (ref 8.7–10.3)
Creatinine, Ser: 0.9 mg/dL (ref 0.57–1.00)
GFR calc non Af Amer: 64 mL/min/{1.73_m2} (ref >59)
GFR, EST AFRICAN AMERICAN: 73 mL/min/{1.73_m2} (ref >59)
GLUCOSE: 82 mg/dL (ref 65–99)
POTASSIUM: 3.5 mmol/L (ref 3.5–5.2)
Sodium: 142 mmol/L (ref 134–144)

## 2016-06-21 LAB — LIPID PANEL
Chol/HDL Ratio: 1.9 ratio units (ref 0.0–4.4)
Cholesterol, Total: 164 mg/dL (ref 100–199)
HDL: 86 mg/dL (ref >39)
LDL Calculated: 65 mg/dL (ref 0–99)
Triglycerides: 63 mg/dL (ref 0–149)
VLDL CHOLESTEROL CAL: 13 mg/dL (ref 5–40)

## 2016-06-21 NOTE — Assessment & Plan Note (Signed)
She is taking pravastatin with any issues, no side effects.  Her LDL today was < 100.   Plan Continue pravastatin.

## 2016-06-21 NOTE — Assessment & Plan Note (Signed)
BP persistently elevated today.  On recheck was 153/81.  On review of preivous blood pressures, she has been > 150.  Given new recommendations and recent trial suggesting lower blood pressures are more appropriate in our older patients, will increase her hctz today.    Plan Continue Norvasc 10mg  Increase hctz to 25mg  daily BP check at next visit BMET today

## 2016-06-21 NOTE — Assessment & Plan Note (Signed)
Improved.  Continues to have non pitting edema.  No SOB with walking or other signs/symptoms that would concern me for heart failure at this time.  She takes lasix about every other day which helps and also keeps her feet up which helps.  Ultrasound of the leg negative for clot.  Xray of the foot shows mild degeneration.    Plan Continue conservative therapy  Lasix every other day Raise legs at night and when sitting Continue to monitor.

## 2016-06-21 NOTE — Assessment & Plan Note (Signed)
She is due for GI cancer screening, but has a bill at Dr. Estell Harpin office.  She is willing to trial stool cards.   Plan Stool cards today Encouraged her to pay bill at GI office in order to be seen.  She did have report of an adenoma in 2004, so colonoscopy is likely more appropriate going forward.

## 2016-06-21 NOTE — Assessment & Plan Note (Signed)
At last visit we discussed sleep hygiene and she thinks now that her napping during the day is due to having the TV on all night and watching TV When she awakens.  She is not interested in changing this habit and will just take a nap in the afternoon.   Plan Continue to monitor.

## 2016-06-21 NOTE — Assessment & Plan Note (Signed)
She has lost weight compared to 2 years ago, but continues to have a high BMI.  She is working on her diet and is interested in joining an exercise program.   I advised her that joining an exercise program was a good idea.  She is interested in research programs at [facility] which she will investigate further.

## 2016-06-26 ENCOUNTER — Other Ambulatory Visit: Payer: Self-pay | Admitting: Internal Medicine

## 2016-07-09 ENCOUNTER — Other Ambulatory Visit: Payer: Self-pay | Admitting: Internal Medicine

## 2016-07-09 DIAGNOSIS — K219 Gastro-esophageal reflux disease without esophagitis: Secondary | ICD-10-CM

## 2016-08-04 ENCOUNTER — Other Ambulatory Visit: Payer: Self-pay | Admitting: Internal Medicine

## 2016-08-04 DIAGNOSIS — J4530 Mild persistent asthma, uncomplicated: Secondary | ICD-10-CM

## 2016-09-19 ENCOUNTER — Ambulatory Visit (INDEPENDENT_AMBULATORY_CARE_PROVIDER_SITE_OTHER): Payer: PPO | Admitting: Internal Medicine

## 2016-09-19 ENCOUNTER — Encounter (INDEPENDENT_AMBULATORY_CARE_PROVIDER_SITE_OTHER): Payer: Self-pay

## 2016-09-19 ENCOUNTER — Encounter: Payer: Self-pay | Admitting: Internal Medicine

## 2016-09-19 VITALS — BP 136/76 | HR 110 | Temp 97.7°F | Ht 66.0 in | Wt 311.6 lb

## 2016-09-19 DIAGNOSIS — Z8611 Personal history of tuberculosis: Secondary | ICD-10-CM

## 2016-09-19 DIAGNOSIS — Z7951 Long term (current) use of inhaled steroids: Secondary | ICD-10-CM | POA: Diagnosis not present

## 2016-09-19 DIAGNOSIS — I10 Essential (primary) hypertension: Secondary | ICD-10-CM

## 2016-09-19 DIAGNOSIS — Z79899 Other long term (current) drug therapy: Secondary | ICD-10-CM | POA: Diagnosis not present

## 2016-09-19 DIAGNOSIS — E785 Hyperlipidemia, unspecified: Secondary | ICD-10-CM | POA: Diagnosis not present

## 2016-09-19 DIAGNOSIS — Z6841 Body Mass Index (BMI) 40.0 and over, adult: Secondary | ICD-10-CM | POA: Diagnosis not present

## 2016-09-19 DIAGNOSIS — M479 Spondylosis, unspecified: Secondary | ICD-10-CM | POA: Diagnosis not present

## 2016-09-19 DIAGNOSIS — J4540 Moderate persistent asthma, uncomplicated: Secondary | ICD-10-CM

## 2016-09-19 DIAGNOSIS — Z8619 Personal history of other infectious and parasitic diseases: Secondary | ICD-10-CM

## 2016-09-19 DIAGNOSIS — A1801 Tuberculosis of spine: Secondary | ICD-10-CM

## 2016-09-19 DIAGNOSIS — B029 Zoster without complications: Secondary | ICD-10-CM

## 2016-09-19 DIAGNOSIS — E7800 Pure hypercholesterolemia, unspecified: Secondary | ICD-10-CM

## 2016-09-19 NOTE — Progress Notes (Signed)
   Subjective:    Patient ID: Erika [name], female    DOB: May 13, 1941, 76 y.o.   MRN: [id number]  CC: 3 month follow up for HTN  HPI  Erika [name] is a 76yo woman with PMH of HTN, HLD, GERD, asthma, obesity, Osteoarthritis who presents for follow up of her elevated BP and medication changes at last visit.   Erika [name] reports that she is doing very well. She has no complaints.  She specifically denies headache, chest pain, lightheadedness, falls, dizziness, recent illness, abdominal pain.  She has been taking HCTZ at a higher dose as requested.  She reports not trying to lose weight, but by our scales she is down about 14 pounds, which she was excited about.    She reports that she sent in her stool cards, but we do not have a record of them in our system.  Will attempt to find out if we have them somewhere.   Review of Systems  Constitutional: Negative for activity change and fatigue.  Respiratory: Negative for cough and shortness of breath.   Cardiovascular: Negative for chest pain.  Gastrointestinal: Negative for abdominal pain.  Musculoskeletal: Positive for gait problem (uses cane, no falls).  Neurological: Negative for dizziness, light-headedness and headaches.       Objective:   Physical Exam  Constitutional: She is oriented to person, place, and time. She appears well-developed and well-nourished. No distress.  Obese  HENT:  Head: Normocephalic and atraumatic.  Cardiovascular: Normal rate, regular rhythm and normal heart sounds.   No murmur heard. Pulmonary/Chest: Effort normal and breath sounds normal. No respiratory distress.  Abdominal: Soft. Bowel sounds are normal.  Neurological: She is alert and oriented to person, place, and time.  Psychiatric: She has a normal mood and affect. Her behavior is normal.    BMET today      Assessment & Plan:  RTC in 6 months, sooner if needed

## 2016-09-19 NOTE — Assessment & Plan Note (Signed)
She has chronic OA of the spin after having an episode of TB of the spine many years ago.  She takes PRN ibuprofen and tramadol with good results  Plan Continue pain management plan.

## 2016-09-19 NOTE — Patient Instructions (Addendum)
Ms. Laperle - -   Thank you for coming in to see me today.  Your blood pressure is much improved and you have lost weight!  Congratulations.  Continue working on your exercise and diet.  Please continue taking your blood pressure medications as prescribed.    Please come back to see me in 6 months, but please call if you should need anything from the clinic before that time.

## 2016-09-19 NOTE — Assessment & Plan Note (Signed)
Well controlled today on her controlling medication Advair.  She uses her albuterol inhaler a few times a week, except when she has a cold and she has to use it daily.  She has no complaints and is not currently ill.   Plan Continue advair Continue PRN albuterol

## 2016-09-19 NOTE — Assessment & Plan Note (Signed)
BP improved today to 136/76.  Her HR is improved from her time walking in to 100.  Pulse is regular on exam. She likely had an elevated HR due to the walk from the parking lot.   Plan Check BMET today Continue amlodipine 10mg  and HCTZ 25mg 

## 2016-09-19 NOTE — Assessment & Plan Note (Signed)
She has a history of this.  No recurrence today but she has chronic back pain from it.   See plan in above OA of spine problem.

## 2016-09-19 NOTE — Assessment & Plan Note (Signed)
LDL checked at last visit was 65.   Plan Continue pravastatin.

## 2016-09-19 NOTE — Assessment & Plan Note (Signed)
Had history of this.  No recurrence today, no skin pain or rash.

## 2016-09-19 NOTE — Assessment & Plan Note (Signed)
Height lower than last check, so BMI now 50.  I think she is actually 5'7" not 5'6".  Based on the actual weight, she has lost about 14 pounds which I congratulated her on and encouraged her to continue exercising.  I do not think she has done the [place] program yet.   Plan Continue diet and lifestyle recommendations.

## 2016-09-20 LAB — BMP8+ANION GAP
Anion Gap: 20 mmol/L — ABNORMAL HIGH (ref 10.0–18.0)
BUN / CREAT RATIO: 12 (ref 12–28)
BUN: 11 mg/dL (ref 8–27)
CALCIUM: 9.3 mg/dL (ref 8.7–10.3)
CHLORIDE: 93 mmol/L — AB (ref 96–106)
CO2: 28 mmol/L (ref 18–29)
Creatinine, Ser: 0.9 mg/dL (ref 0.57–1.00)
GFR, EST AFRICAN AMERICAN: 73 mL/min/{1.73_m2} (ref >59)
GFR, EST NON AFRICAN AMERICAN: 64 mL/min/{1.73_m2} (ref >59)
GLUCOSE: 83 mg/dL (ref 65–99)
POTASSIUM: 3.1 mmol/L — AB (ref 3.5–5.2)
Sodium: 141 mmol/L (ref 134–144)

## 2016-10-03 ENCOUNTER — Telehealth: Payer: Self-pay | Admitting: Internal Medicine

## 2016-10-03 DIAGNOSIS — E876 Hypokalemia: Secondary | ICD-10-CM

## 2016-10-03 MED ORDER — POTASSIUM CHLORIDE ER 20 MEQ PO TBCR: 20.0000 meq | EXTENDED_RELEASE_TABLET | Freq: Two times a day (BID) | ORAL | 0 refills | Status: DC

## 2016-10-03 NOTE — Telephone Encounter (Signed)
Called Erika [name] re: low K to 3.1. She has had low k in past, likely related to lasix dosage.   Advised 15meq KCL BID for 2 days and recheck BMET on Monday.   She was agreeable to this plan.   Rx sent to [facility].

## 2016-10-10 ENCOUNTER — Other Ambulatory Visit (INDEPENDENT_AMBULATORY_CARE_PROVIDER_SITE_OTHER): Payer: PPO

## 2016-10-10 DIAGNOSIS — E876 Hypokalemia: Secondary | ICD-10-CM

## 2016-10-11 LAB — BMP8+ANION GAP
Anion Gap: 20 mmol/L — ABNORMAL HIGH (ref 10.0–18.0)
BUN / CREAT RATIO: 24 (ref 12–28)
BUN: 27 mg/dL (ref 8–27)
CO2: 28 mmol/L (ref 18–29)
Calcium: 9.4 mg/dL (ref 8.7–10.3)
Chloride: 94 mmol/L — ABNORMAL LOW (ref 96–106)
Creatinine, Ser: 1.13 mg/dL — ABNORMAL HIGH (ref 0.57–1.00)
GFR calc non Af Amer: 48 mL/min/{1.73_m2} — ABNORMAL LOW (ref >59)
GFR, EST AFRICAN AMERICAN: 56 mL/min/{1.73_m2} — AB (ref >59)
Glucose: 69 mg/dL (ref 65–99)
Potassium: 3.3 mmol/L — ABNORMAL LOW (ref 3.5–5.2)
Sodium: 142 mmol/L (ref 134–144)

## 2016-10-12 ENCOUNTER — Telehealth: Payer: Self-pay | Admitting: *Deleted

## 2016-10-12 ENCOUNTER — Other Ambulatory Visit: Payer: Self-pay | Admitting: Internal Medicine

## 2016-10-12 DIAGNOSIS — E876 Hypokalemia: Secondary | ICD-10-CM

## 2016-10-12 MED ORDER — POTASSIUM CHLORIDE ER 20 MEQ PO TBCR: 20.0000 meq | EXTENDED_RELEASE_TABLET | Freq: Two times a day (BID) | ORAL | 0 refills | Status: DC

## 2016-10-12 NOTE — Telephone Encounter (Signed)
-----   Message from [name], MD sent at 10/12/2016  1:13 PM EST ----- Holley Raring - -  Can you please call Ms. Rosner and tell her that her K is still low, but better.  I have placed another prescription for potassium for her and I would like her to come in early next week and have her levels checked again.  If still low at that time, I would like to see her to review diet and medications.   Thank you

## 2016-10-12 NOTE — Progress Notes (Signed)
K is still low.  Ordered supplementation and having patient come in for recheck next week.

## 2016-10-12 NOTE — Telephone Encounter (Signed)
Pt called /informed "her K is still low, but better. I have placed another prescription for potassium for her and I would like her to come in early next week and have her levels checked again. If still low at that time, I would like to see her to review diet and medications." per Dr Daryll Drown. Stated ok ; informed K+ is 3.3. Lab appt scheduled Wed 3/14 @ 0900 AM.

## 2016-10-16 ENCOUNTER — Telehealth: Payer: Self-pay | Admitting: Internal Medicine

## 2016-10-16 NOTE — Telephone Encounter (Signed)
APT. REMINDER CALL, LMTCB °

## 2016-10-17 ENCOUNTER — Other Ambulatory Visit (INDEPENDENT_AMBULATORY_CARE_PROVIDER_SITE_OTHER): Payer: PPO

## 2016-10-17 DIAGNOSIS — E876 Hypokalemia: Secondary | ICD-10-CM

## 2016-10-18 LAB — BMP8+ANION GAP
Anion Gap: 18 mmol/L (ref 10.0–18.0)
BUN / CREAT RATIO: 21 (ref 12–28)
BUN: 19 mg/dL (ref 8–27)
CO2: 30 mmol/L — AB (ref 18–29)
CREATININE: 0.91 mg/dL (ref 0.57–1.00)
Calcium: 9.1 mg/dL (ref 8.7–10.3)
Chloride: 93 mmol/L — ABNORMAL LOW (ref 96–106)
GFR calc Af Amer: 72 mL/min/{1.73_m2} (ref >59)
GFR calc non Af Amer: 63 mL/min/{1.73_m2} (ref >59)
Glucose: 85 mg/dL (ref 65–99)
Potassium: 3.2 mmol/L — ABNORMAL LOW (ref 3.5–5.2)
SODIUM: 141 mmol/L (ref 134–144)

## 2016-10-19 ENCOUNTER — Telehealth: Payer: Self-pay | Admitting: Internal Medicine

## 2016-10-19 MED ORDER — POTASSIUM CHLORIDE ER 20 MEQ PO TBCR: 20.0000 meq | EXTENDED_RELEASE_TABLET | Freq: Every day | ORAL | 0 refills | Status: DC

## 2016-10-19 NOTE — Telephone Encounter (Signed)
Called Ms. Wortley to tell her that K is still low.    Will try K 56meq daily for 6 weeks.  Persistently low likely to being on low dose lasix and HCTZ.  May  Need to change her BP medications in future.    Will have front desk make her an appointment in April to check potassium.

## 2016-10-23 ENCOUNTER — Other Ambulatory Visit: Payer: Self-pay | Admitting: Internal Medicine

## 2016-10-23 ENCOUNTER — Telehealth: Payer: Self-pay | Admitting: *Deleted

## 2016-10-23 NOTE — Telephone Encounter (Signed)
-----   Message from [name], MD sent at 10/19/2016 12:51 PM EDT ----- Can you please make Erika [name] an appointment in one of my open slots in April?   Thanks!

## 2016-10-23 NOTE — Telephone Encounter (Signed)
Appt scheduled 4/24@ 0845 AM.

## 2016-10-24 NOTE — Telephone Encounter (Signed)
Called pt - informed K+ still low per Dr Daryll Drown. Stated she already picked up pills.

## 2016-10-25 NOTE — Telephone Encounter (Signed)
Thank you :)

## 2016-10-31 ENCOUNTER — Other Ambulatory Visit: Payer: Self-pay | Admitting: Internal Medicine

## 2016-10-31 DIAGNOSIS — I10 Essential (primary) hypertension: Secondary | ICD-10-CM

## 2016-11-01 ENCOUNTER — Other Ambulatory Visit: Payer: Self-pay | Admitting: Internal Medicine

## 2016-11-22 ENCOUNTER — Other Ambulatory Visit: Payer: Self-pay | Admitting: Internal Medicine

## 2016-11-22 DIAGNOSIS — K219 Gastro-esophageal reflux disease without esophagitis: Secondary | ICD-10-CM

## 2016-11-26 ENCOUNTER — Telehealth: Payer: Self-pay | Admitting: Internal Medicine

## 2016-11-26 NOTE — Telephone Encounter (Signed)
APT. REMINDER CALL, LMTCB °

## 2016-11-27 ENCOUNTER — Ambulatory Visit (HOSPITAL_COMMUNITY)
Admission: RE | Admit: 2016-11-27 | Discharge: 2016-11-27 | Disposition: A | Discharge status: Home / Self Care | Payer: PPO | Source: Ambulatory Visit | Attending: Internal Medicine | Admitting: Internal Medicine

## 2016-11-27 ENCOUNTER — Ambulatory Visit (INDEPENDENT_AMBULATORY_CARE_PROVIDER_SITE_OTHER): Payer: PPO | Admitting: Internal Medicine

## 2016-11-27 ENCOUNTER — Encounter: Payer: Self-pay | Admitting: Internal Medicine

## 2016-11-27 VITALS — BP 147/69 | HR 98 | Temp 97.9°F | Ht 66.0 in | Wt 305.7 lb

## 2016-11-27 DIAGNOSIS — I10 Essential (primary) hypertension: Secondary | ICD-10-CM

## 2016-11-27 DIAGNOSIS — E876 Hypokalemia: Secondary | ICD-10-CM

## 2016-11-27 MED ORDER — LISINOPRIL 5 MG PO TABS: 5.0000 mg | ORAL_TABLET | Freq: Every day | ORAL | 6 refills | Status: DC

## 2016-11-27 MED ORDER — POTASSIUM CHLORIDE CRYS ER 20 MEQ PO TBCR: 10.0000 meq | EXTENDED_RELEASE_TABLET | Freq: Every day | ORAL | 1 refills | Status: DC

## 2016-11-27 NOTE — Assessment & Plan Note (Signed)
K at last check still low at 3.3.  She has been on supplementation  Plan Decrease K supplementation to 54meq per day STOP HCTZ Start lisinopril BMET today and in 2-3 weeks  If K level normal, will likely ask her to stop supplementation altogether EKG done today unchanged from previous

## 2016-11-27 NOTE — Progress Notes (Signed)
   Subjective:    Patient ID: Erika [name], female    DOB: 28-Mar-1941, 76 y.o.   MRN: [id number]  CC: Hypokalemia  HPI  Erika [name] is a 76yo woman with PMH of HTN who has recently had low K due to being on 2 diuretics.  She has had persistently low K down to 3.1 with supplementation. She is on HCTZ and intermittent lasix.  She reports using the lasix about 2-3 times per week and remembering to take the HCTZ "most days."  She has her KCl, and is taking it most days as well.  She has no palpitations, muscle weakness or any clinical changes. She reports feeling well today.   She brought in her medications which we reviewed.     Review of Systems  Constitutional: Negative for activity change, fatigue and fever.  Respiratory: Negative for cough and shortness of breath.   Cardiovascular: Negative for chest pain and leg swelling (occasional at night).  Neurological: Negative for dizziness, weakness and light-headedness.       Objective:   Physical Exam  Constitutional: She appears well-developed and well-nourished. No distress.  Cardiovascular: Normal rate, regular rhythm and normal heart sounds.   No murmur heard. Pulmonary/Chest: Effort normal and breath sounds normal. No respiratory distress. She has no wheezes.  Musculoskeletal: She exhibits no edema or tenderness.  Psychiatric: She has a normal mood and affect. Her behavior is normal.    BMET today  EKG today.    Assessment & Plan:  RTC 2 weeks for labs and BP check.

## 2016-11-27 NOTE — Assessment & Plan Note (Signed)
She has been having issues with hypokalemia due to being on HCTZ and lasix intermittently.  BP is also mildly elevated today.   Plan STOP HCTZ Start lisinopril 5mg  daily Follow up in 2-3 weeks Consider home BP log to better manage BP

## 2016-11-27 NOTE — Patient Instructions (Addendum)
Erika [name] - -  For your blood pressure - -  STOP Hydrochlorothiazide.   START lisinopril 5mg  once a day.   Please cut your potassium in half and take only once a day in order to get 10 meq of potassium per day.   Please make an appointment on May 9 or May 16 to  Come in an see me to check your potassium and blood pressure.   Thank you!

## 2016-11-28 LAB — BMP8+ANION GAP
Anion Gap: 19 mmol/L — ABNORMAL HIGH (ref 10.0–18.0)
BUN / CREAT RATIO: 20 (ref 12–28)
BUN: 19 mg/dL (ref 8–27)
CALCIUM: 9.5 mg/dL (ref 8.7–10.3)
CHLORIDE: 93 mmol/L — AB (ref 96–106)
CO2: 27 mmol/L (ref 18–29)
CREATININE: 0.96 mg/dL (ref 0.57–1.00)
GFR, EST AFRICAN AMERICAN: 68 mL/min/{1.73_m2} (ref >59)
GFR, EST NON AFRICAN AMERICAN: 59 mL/min/{1.73_m2} — AB (ref >59)
GLUCOSE: 73 mg/dL (ref 65–99)
Potassium: 3.4 mmol/L — ABNORMAL LOW (ref 3.5–5.2)
Sodium: 139 mmol/L (ref 134–144)

## 2016-12-12 ENCOUNTER — Ambulatory Visit (INDEPENDENT_AMBULATORY_CARE_PROVIDER_SITE_OTHER): Payer: PPO | Admitting: Internal Medicine

## 2016-12-12 ENCOUNTER — Encounter: Payer: Self-pay | Admitting: Internal Medicine

## 2016-12-12 VITALS — BP 123/72 | HR 96 | Temp 97.7°F | Ht 66.0 in | Wt 308.0 lb

## 2016-12-12 DIAGNOSIS — I10 Essential (primary) hypertension: Secondary | ICD-10-CM | POA: Diagnosis not present

## 2016-12-12 DIAGNOSIS — E876 Hypokalemia: Secondary | ICD-10-CM

## 2016-12-12 DIAGNOSIS — Z0000 Encounter for general adult medical examination without abnormal findings: Secondary | ICD-10-CM

## 2016-12-12 DIAGNOSIS — Z79899 Other long term (current) drug therapy: Secondary | ICD-10-CM | POA: Diagnosis not present

## 2016-12-12 NOTE — Progress Notes (Signed)
   Subjective:    Patient ID: Erika [name], female    DOB: 06/29/1941, 76 y.o.   MRN: [id number]  CC: 2 week follow up for Hypokalemia and HTN  HPI  Ms. Algeo is a 76yo woman with PMH of HTN and recently hypokalemia.   Today, Ms. Mittelstadt reports that she is doing well on the lisinopril and not having any problems with it.  She denies swelling in the legs, chest pain, worsening SOB, lip swelling or any other issues.  She denies dizziness and weakness.  She notes that she is wheezing a little bit this morning, but she did not take her inhaler before coming to the clinic.  BP was initially high at 145/76 but then lowered to 123/72 on recheck.    She has been splitting her KCL in half without issue.  She is curious what her blood type is, but has no other questions.   Review of Systems  Constitutional: Negative for activity change and fatigue.  Respiratory: Positive for shortness of breath (mild from walking in, no change from baseline) and wheezing.   Cardiovascular: Negative for chest pain and leg swelling.  Neurological: Negative for dizziness and weakness.       Objective:   Physical Exam  Constitutional: She is oriented to person, place, and time. She appears well-developed and well-nourished.  HENT:  Head: Normocephalic and atraumatic.  Cardiovascular: Normal rate, regular rhythm and normal heart sounds.   No murmur heard. Pulmonary/Chest: Effort normal. She has wheezes (mild, upper airway).  Musculoskeletal: She exhibits no edema.  Neurological: She is alert and oriented to person, place, and time.  Psychiatric: She has a normal mood and affect. Her behavior is normal.    Stool card today.      Assessment & Plan:  RTC in 3 months, sooner if needed.

## 2016-12-12 NOTE — Assessment & Plan Note (Signed)
FIT stool card supplied to patient for her to bring back in for colon cancer screening.

## 2016-12-12 NOTE — Assessment & Plan Note (Signed)
K improved to 3.4 at last visit.  Recheck BMET today.

## 2016-12-12 NOTE — Assessment & Plan Note (Signed)
BP is well controlled today on recheck at 123/72.   Plan Continue amlodipine and lisinopril.  I reminded her to throw out her HCTZ or bring to the clinic to dispose.

## 2016-12-12 NOTE — Patient Instructions (Addendum)
Erika [name] - -  Your blood pressure looks great.  I will call you with the results of your blood work.   Please return the stool cards at your earliest convenience.    Please only take amlodipine and lisinopril for your blood pressure.  Continue to take lasix (furosemide) as needed for swelling.   Thank you!

## 2016-12-13 LAB — BMP8+ANION GAP
ANION GAP: 15 mmol/L (ref 10.0–18.0)
BUN/Creatinine Ratio: 13 (ref 12–28)
BUN: 11 mg/dL (ref 8–27)
CO2: 27 mmol/L (ref 18–29)
CREATININE: 0.86 mg/dL (ref 0.57–1.00)
Calcium: 8.9 mg/dL (ref 8.7–10.3)
Chloride: 99 mmol/L (ref 96–106)
GFR calc Af Amer: 78 mL/min/{1.73_m2} (ref >59)
GFR calc non Af Amer: 67 mL/min/{1.73_m2} (ref >59)
Glucose: 84 mg/dL (ref 65–99)
Potassium: 3.5 mmol/L (ref 3.5–5.2)
SODIUM: 141 mmol/L (ref 134–144)

## 2016-12-31 ENCOUNTER — Other Ambulatory Visit: Payer: Self-pay | Admitting: Internal Medicine

## 2017-01-01 NOTE — Telephone Encounter (Signed)
Rx phoned in ([facility]).  LMR.Despina Hidden Cassady5/29/20182:34 PM

## 2017-01-29 ENCOUNTER — Encounter: Payer: Self-pay | Admitting: *Deleted

## 2017-01-29 NOTE — Progress Notes (Signed)
Mattalyn Anderegg  [id number]  IFOBT specimen collected 01-22-2017 and received in the [facility] 01-29-2017.   Unable to analyze due to age of specimen. Analysis must be performed within 6 days of collection per policy and procedure.  Dr Ezzie Dural was notified 01-29-2017 and agreed for [place]  to give Ms. Dalgleish a new IFOBT take home kit at her next [facility] office visit.   Maryan Rued, PBT 01-29-2017  1634p

## 2017-01-31 ENCOUNTER — Other Ambulatory Visit: Payer: Self-pay | Admitting: Internal Medicine

## 2017-02-14 ENCOUNTER — Other Ambulatory Visit: Payer: Self-pay | Admitting: Internal Medicine

## 2017-02-15 ENCOUNTER — Other Ambulatory Visit: Payer: Self-pay | Admitting: Internal Medicine

## 2017-02-15 NOTE — Telephone Encounter (Signed)
Patient is on tramadol prn as per Dr. Doristine Section note. [place] narcotic database shows last refill on 01/01/17. Will refill * 1 and route to PCP

## 2017-02-15 NOTE — Telephone Encounter (Signed)
Tramadol called in to [facility].

## 2017-03-20 ENCOUNTER — Ambulatory Visit (INDEPENDENT_AMBULATORY_CARE_PROVIDER_SITE_OTHER): Payer: PPO | Admitting: Internal Medicine

## 2017-03-20 ENCOUNTER — Encounter: Payer: Self-pay | Admitting: Internal Medicine

## 2017-03-20 VITALS — BP 127/65 | HR 98 | Temp 97.9°F | Ht 66.0 in | Wt 308.9 lb

## 2017-03-20 DIAGNOSIS — Z736 Limitation of activities due to disability: Secondary | ICD-10-CM

## 2017-03-20 DIAGNOSIS — M549 Dorsalgia, unspecified: Secondary | ICD-10-CM

## 2017-03-20 DIAGNOSIS — J4540 Moderate persistent asthma, uncomplicated: Secondary | ICD-10-CM | POA: Diagnosis not present

## 2017-03-20 DIAGNOSIS — R600 Localized edema: Secondary | ICD-10-CM | POA: Diagnosis not present

## 2017-03-20 DIAGNOSIS — K219 Gastro-esophageal reflux disease without esophagitis: Secondary | ICD-10-CM

## 2017-03-20 DIAGNOSIS — I491 Atrial premature depolarization: Secondary | ICD-10-CM

## 2017-03-20 DIAGNOSIS — M1710 Unilateral primary osteoarthritis, unspecified knee: Secondary | ICD-10-CM | POA: Diagnosis not present

## 2017-03-20 DIAGNOSIS — M1711 Unilateral primary osteoarthritis, right knee: Secondary | ICD-10-CM

## 2017-03-20 DIAGNOSIS — J4530 Mild persistent asthma, uncomplicated: Secondary | ICD-10-CM

## 2017-03-20 DIAGNOSIS — I10 Essential (primary) hypertension: Secondary | ICD-10-CM

## 2017-03-20 DIAGNOSIS — Z79899 Other long term (current) drug therapy: Secondary | ICD-10-CM

## 2017-03-20 DIAGNOSIS — R252 Cramp and spasm: Secondary | ICD-10-CM | POA: Diagnosis not present

## 2017-03-20 DIAGNOSIS — Z7951 Long term (current) use of inhaled steroids: Secondary | ICD-10-CM

## 2017-03-20 DIAGNOSIS — R2689 Other abnormalities of gait and mobility: Secondary | ICD-10-CM | POA: Diagnosis not present

## 2017-03-20 DIAGNOSIS — Z0000 Encounter for general adult medical examination without abnormal findings: Secondary | ICD-10-CM

## 2017-03-20 DIAGNOSIS — E876 Hypokalemia: Secondary | ICD-10-CM | POA: Diagnosis not present

## 2017-03-20 DIAGNOSIS — M479 Spondylosis, unspecified: Secondary | ICD-10-CM

## 2017-03-20 DIAGNOSIS — M7989 Other specified soft tissue disorders: Secondary | ICD-10-CM

## 2017-03-20 MED ORDER — PRAVASTATIN SODIUM 20 MG PO TABS
20.0000 mg | ORAL_TABLET | Freq: Every day | ORAL | 3 refills | Status: DC
Start: 2017-03-20 — End: 2019-04-15

## 2017-03-20 MED ORDER — ALBUTEROL SULFATE HFA 108 (90 BASE) MCG/ACT IN AERS: INHALATION_SPRAY | RESPIRATORY_TRACT | 6 refills | Status: DC

## 2017-03-20 MED ORDER — TRAMADOL HCL 50 MG PO TABS: 50.0000 mg | ORAL_TABLET | Freq: Two times a day (BID) | ORAL | 3 refills | Status: DC | PRN

## 2017-03-20 MED ORDER — POTASSIUM CHLORIDE CRYS ER 20 MEQ PO TBCR: 10.0000 meq | EXTENDED_RELEASE_TABLET | Freq: Every day | ORAL | 1 refills | Status: DC

## 2017-03-20 MED ORDER — OMEPRAZOLE 20 MG PO CPDR: 20.0000 mg | DELAYED_RELEASE_CAPSULE | Freq: Every day | ORAL | 1 refills | Status: DC

## 2017-03-20 MED ORDER — FLUTICASONE-SALMETEROL 500-50 MCG/DOSE IN AEPB: INHALATION_SPRAY | RESPIRATORY_TRACT | 3 refills | Status: DC

## 2017-03-20 NOTE — Patient Instructions (Addendum)
Erika [name] - -  For your colon cancer screening, please collect a FIT test from the lab and return with all the information filled out.  More information below.   We will order you a shower chair.    Please keep taking all of your medications as prescribed.    Thank you!  Come back to see me in 4 months.   Stool for Occult Blood Test Why am I having this test? Stool for occult blood, or fecal occult blood test (FOBT), is a test that is used to screen for gastrointestinal (GI) bleeding, which may be an indicator of colon cancer. This test can also detect small amounts of blood in your stool (feces) from other causes, such as ulcers or hemorrhoids. This test is usually done as part of an annual routine examination after age 18. What kind of sample is taken? A sample of your stool is required for this test. Your health care provider may collect the sample with a swab of the rectum. Or, you may be instructed to collect the sample in a container at home. If you are instructed to collect the sample, your health care provider will provide you with the instructions and the supplies that you will need to do that. How do I collect samples at home? A stool sample may need to be collected at home. When collecting a sample at home, make sure that you:  Use the sterile containers and other supplies that were given to you from the lab.  Do not mix urine, toilet paper, or water with your sample.  Label all slides and containers with your name and the date when you collected the sample.  Your health care provider or lab staff will give you one or more test "cards." You will collect a separate sample from three different stools, usually on different days that follow each other. Follow these steps for each sample: 1. Collect a stool sample into a clean container. 2. With an applicator stick, apply a thin smear of stool sample onto each filter paper square or window that is on the card. 3. Allow the  filter paper to dry. After it is dry, the sample will be stable.  Usually, you will return all of the samples to your health care provider or lab at the same time. How do I prepare for this test?  Do not eat any red meat within three days before testing.  Follow your health care provider's instructions about eating and drinking prior to the test. Your health care provider may instruct you to avoid other foods or substances.  Ask your health care provider about taking or not taking your medicines prior to the test. You may be instructed to avoid certain medicines that are known to interfere with this test. How are the results reported? Your test results will be reported as either positive or negative. It is your responsibility to obtain your test results. Ask the lab or department performing the test when and how you will get your results. What do the results mean? A negative test result means that there is no occult blood within the stool. A negative result is normal. A positive test result may mean that there is blood in the stool. Causes of blood in the stool include:  GI tumors.  Certain GI diseases.  GI trauma or recent surgery.  Hemorrhoids.  Talk with your health care provider to discuss your results, treatment options, and if necessary, the need for more tests. Talk  with your health care provider if you have any questions about your results. Talk with your health care provider to discuss your results, treatment options, and if necessary, the need for more tests. Talk with your health care provider if you have any questions about your results. This information is not intended to replace advice given to you by your health care provider. Make sure you discuss any questions you have with your health care provider. Document Released: 08/17/2004 Document Revised: 03/21/2016 Document Reviewed: 12/18/2013 Elsevier Interactive Patient Education  [employer].

## 2017-03-20 NOTE — Progress Notes (Signed)
   Subjective:    Patient ID: Erika [name], female    DOB: 1941/07/14, 76 y.o.   MRN: [id number]  3 month follow up visit for HTN  HPI   Erika [name] is a 75yo woman with PMH of HTN, asthma, GERD who presents for follow up.    Today, Erika [name] reports that she is doing very well.  She had a little wheezing after walking in her, but she took her albuterol and this improved.  She reports her asthma is well controlled.  Her reflux is well controlled.  Her BP today was also well controlled.  She is asking for a shower chair to help with bathing.  She has one complaint, which is occasional coughing.  Feeling like something is caught in her throat.  She will cough and come up with some whitish phlegm.  This does not happen every day. She is intermittently taking an allergy medication.  She denies any GERD like symptoms associated with the coughing.  She is asking for refills on some of her medications.  Review of Systems  Constitutional: Negative for activity change (She has trouble being active due to her pain) and fever.  Respiratory: Positive for cough and wheezing. Negative for choking and shortness of breath.   Cardiovascular: Negative for chest pain, palpitations and leg swelling (improved with lasix).  Gastrointestinal: Negative for constipation and diarrhea.  Genitourinary: Negative for difficulty urinating and dysuria.  Musculoskeletal: Positive for arthralgias, back pain and gait problem (using cane).  Skin: Negative for color change, pallor and rash.  Neurological: Negative for dizziness and weakness.  Psychiatric/Behavioral: Negative for dysphoric mood.       Objective:   Physical Exam  Constitutional: She is oriented to person, place, and time. She appears well-developed and well-nourished.  HENT:  Head: Normocephalic and atraumatic.  Eyes: Conjunctivae are normal. No scleral icterus.  Cardiovascular: Normal rate, regular rhythm and normal heart sounds.   No murmur  heard. Pulmonary/Chest: Effort normal and breath sounds normal. No respiratory distress.  Abdominal: Soft. Bowel sounds are normal. She exhibits no distension.  Musculoskeletal: She exhibits edema (non pitting at ankles). She exhibits no tenderness.  Neurological: She is alert and oriented to person, place, and time.  Skin: Skin is dry.  Psychiatric: She has a normal mood and affect. Her behavior is normal.  Vitals reviewed.   BMET today.       Assessment & Plan:  RTC in 4 months.

## 2017-03-20 NOTE — Progress Notes (Signed)
Tramadol rx called to [facility]. 

## 2017-03-21 ENCOUNTER — Encounter: Payer: Self-pay | Admitting: Internal Medicine

## 2017-03-21 LAB — BMP8+ANION GAP
Anion Gap: 17 mmol/L (ref 10.0–18.0)
BUN / CREAT RATIO: 15 (ref 12–28)
BUN: 13 mg/dL (ref 8–27)
CO2: 22 mmol/L (ref 20–29)
CREATININE: 0.86 mg/dL (ref 0.57–1.00)
Calcium: 9.2 mg/dL (ref 8.7–10.3)
Chloride: 102 mmol/L (ref 96–106)
GFR, EST AFRICAN AMERICAN: 77 mL/min/{1.73_m2} (ref >59)
GFR, EST NON AFRICAN AMERICAN: 67 mL/min/{1.73_m2} (ref >59)
Glucose: 66 mg/dL (ref 65–99)
Potassium: 4.4 mmol/L (ref 3.5–5.2)
SODIUM: 141 mmol/L (ref 134–144)

## 2017-03-21 NOTE — Assessment & Plan Note (Signed)
She is using a cane and occasionally a wheelchair. She uses ibuprofen and tramadol for relief.  She does have limitations on exercise ability due to the pain   Plan Continue current therapy.

## 2017-03-21 NOTE — Assessment & Plan Note (Signed)
Well controlled on omeprazole.  She has no complaints today.  Consider stopping therapy to see if she needs this anymore in future.   Plan Continue omeprazole 20mg  daily

## 2017-03-21 NOTE — Assessment & Plan Note (Signed)
K is improved today to 4.4.  She is now on a regimen that will not lower her K.   Plan Continue K supplementation until next visit, If K stable at that time, will d/c supplementation.

## 2017-03-21 NOTE — Assessment & Plan Note (Signed)
She has mild wheezing today on exam, this improved with albuterol.  The wheezing started with the exertion of walking in to clinic.  She was breathing comfortably when I saw her.  She reports intermittent albuterol use which has not increased lately.  She does have increased phlegm with coughing occasionally which she described to me.  The phlegm is clear to white.  She does not appear to be infected today.   Plan Continue Advair Continue prn albuterol  Increase use of OTC allergy medication to daily to see if this helps the cough.

## 2017-03-21 NOTE — Assessment & Plan Note (Signed)
She reports no recent palpitations or chest pain.  She had a regular rhythm on exam.   Plan Continue to monitor, only seen on one EKG in 2012.

## 2017-03-21 NOTE — Assessment & Plan Note (Signed)
DEXA - Nml in 2014 MMG - 2017 normal Pneumo - UTD Flu - Due in the fall Pap - nml in 2002, 2008, 2011, > 74yo old, no need to screen.  Colon - FIT card today

## 2017-03-21 NOTE — Assessment & Plan Note (Signed)
Blood pressure is well controlled today on current medications.  She has no history of chest pain, headaches, blurry vision.   Plan Continue amlodipine, lisinopril.   K is 4.4 now, renal function is stable.

## 2017-03-21 NOTE — Assessment & Plan Note (Signed)
She reports still getting these.  She takes mustard and this seems to help.  They seem to get worse with exercise.   Plan Continue to monitor, prn mustard per her preference.

## 2017-03-21 NOTE — Assessment & Plan Note (Signed)
This is intermittent.  She takes lasix as needed and feels this helps.   Plan Continue PRN lasix

## 2017-03-23 ENCOUNTER — Other Ambulatory Visit: Payer: Self-pay | Admitting: Internal Medicine

## 2017-03-26 DIAGNOSIS — K219 Gastro-esophageal reflux disease without esophagitis: Secondary | ICD-10-CM | POA: Diagnosis not present

## 2017-04-02 ENCOUNTER — Other Ambulatory Visit: Payer: PPO

## 2017-04-02 DIAGNOSIS — K219 Gastro-esophageal reflux disease without esophagitis: Secondary | ICD-10-CM

## 2017-04-04 ENCOUNTER — Other Ambulatory Visit: Payer: Self-pay | Admitting: Internal Medicine

## 2017-04-04 DIAGNOSIS — Z1231 Encounter for screening mammogram for malignant neoplasm of breast: Secondary | ICD-10-CM

## 2017-04-05 ENCOUNTER — Ambulatory Visit
Admission: RE | Admit: 2017-04-05 | Discharge: 2017-04-05 | Disposition: A | Discharge status: Home / Self Care | Payer: PPO | Source: Ambulatory Visit | Attending: Internal Medicine | Admitting: Internal Medicine

## 2017-04-05 DIAGNOSIS — Z1231 Encounter for screening mammogram for malignant neoplasm of breast: Secondary | ICD-10-CM

## 2017-04-08 LAB — FECAL OCCULT BLOOD, IMMUNOCHEMICAL: FECAL OCCULT BLD: NEGATIVE

## 2017-04-09 ENCOUNTER — Encounter: Payer: Self-pay | Admitting: Internal Medicine

## 2017-04-17 ENCOUNTER — Ambulatory Visit: Payer: PPO

## 2017-04-26 ENCOUNTER — Ambulatory Visit (INDEPENDENT_AMBULATORY_CARE_PROVIDER_SITE_OTHER): Payer: PPO | Admitting: *Deleted

## 2017-04-26 DIAGNOSIS — Z23 Encounter for immunization: Secondary | ICD-10-CM

## 2017-05-29 ENCOUNTER — Inpatient Hospital Stay (HOSPITAL_COMMUNITY)
Admission: EM | Admit: 2017-05-29 | Discharge: 2017-06-03 | DRG: 815 | Disposition: A | Discharge status: Skilled Nursing Facility | Payer: PPO | Attending: Internal Medicine | Admitting: Internal Medicine

## 2017-05-29 ENCOUNTER — Ambulatory Visit: Payer: PPO

## 2017-05-29 ENCOUNTER — Encounter (HOSPITAL_COMMUNITY): Payer: Self-pay | Admitting: Emergency Medicine

## 2017-05-29 ENCOUNTER — Inpatient Hospital Stay (HOSPITAL_COMMUNITY): Payer: PPO

## 2017-05-29 ENCOUNTER — Emergency Department (HOSPITAL_COMMUNITY): Payer: PPO

## 2017-05-29 DIAGNOSIS — J4540 Moderate persistent asthma, uncomplicated: Secondary | ICD-10-CM | POA: Diagnosis present

## 2017-05-29 DIAGNOSIS — E871 Hypo-osmolality and hyponatremia: Secondary | ICD-10-CM | POA: Diagnosis present

## 2017-05-29 DIAGNOSIS — Z79899 Other long term (current) drug therapy: Secondary | ICD-10-CM | POA: Diagnosis not present

## 2017-05-29 DIAGNOSIS — R771 Abnormality of globulin: Secondary | ICD-10-CM | POA: Diagnosis present

## 2017-05-29 DIAGNOSIS — J90 Pleural effusion, not elsewhere classified: Secondary | ICD-10-CM | POA: Diagnosis not present

## 2017-05-29 DIAGNOSIS — M546 Pain in thoracic spine: Secondary | ICD-10-CM | POA: Diagnosis not present

## 2017-05-29 DIAGNOSIS — D72821 Monocytosis (symptomatic): Secondary | ICD-10-CM | POA: Diagnosis not present

## 2017-05-29 DIAGNOSIS — I10 Essential (primary) hypertension: Secondary | ICD-10-CM | POA: Diagnosis present

## 2017-05-29 DIAGNOSIS — Z6841 Body Mass Index (BMI) 40.0 and over, adult: Secondary | ICD-10-CM | POA: Diagnosis not present

## 2017-05-29 DIAGNOSIS — M545 Low back pain, unspecified: Secondary | ICD-10-CM

## 2017-05-29 DIAGNOSIS — R7982 Elevated C-reactive protein (CRP): Secondary | ICD-10-CM | POA: Diagnosis present

## 2017-05-29 DIAGNOSIS — R109 Unspecified abdominal pain: Secondary | ICD-10-CM | POA: Diagnosis not present

## 2017-05-29 DIAGNOSIS — Z7982 Long term (current) use of aspirin: Secondary | ICD-10-CM

## 2017-05-29 DIAGNOSIS — R079 Chest pain, unspecified: Secondary | ICD-10-CM | POA: Diagnosis not present

## 2017-05-29 DIAGNOSIS — M479 Spondylosis, unspecified: Secondary | ICD-10-CM | POA: Diagnosis present

## 2017-05-29 DIAGNOSIS — D696 Thrombocytopenia, unspecified: Secondary | ICD-10-CM | POA: Diagnosis not present

## 2017-05-29 DIAGNOSIS — R05 Cough: Secondary | ICD-10-CM | POA: Diagnosis not present

## 2017-05-29 DIAGNOSIS — M62838 Other muscle spasm: Secondary | ICD-10-CM | POA: Diagnosis present

## 2017-05-29 DIAGNOSIS — R945 Abnormal results of liver function studies: Secondary | ICD-10-CM | POA: Diagnosis present

## 2017-05-29 DIAGNOSIS — R9431 Abnormal electrocardiogram [ECG] [EKG]: Secondary | ICD-10-CM | POA: Diagnosis not present

## 2017-05-29 DIAGNOSIS — M47816 Spondylosis without myelopathy or radiculopathy, lumbar region: Secondary | ICD-10-CM | POA: Diagnosis not present

## 2017-05-29 DIAGNOSIS — I480 Paroxysmal atrial fibrillation: Secondary | ICD-10-CM | POA: Diagnosis present

## 2017-05-29 DIAGNOSIS — R41841 Cognitive communication deficit: Secondary | ICD-10-CM | POA: Diagnosis not present

## 2017-05-29 DIAGNOSIS — Z8611 Personal history of tuberculosis: Secondary | ICD-10-CM | POA: Diagnosis not present

## 2017-05-29 DIAGNOSIS — R2689 Other abnormalities of gait and mobility: Secondary | ICD-10-CM | POA: Diagnosis not present

## 2017-05-29 DIAGNOSIS — K219 Gastro-esophageal reflux disease without esophagitis: Secondary | ICD-10-CM | POA: Diagnosis not present

## 2017-05-29 DIAGNOSIS — E785 Hyperlipidemia, unspecified: Secondary | ICD-10-CM | POA: Diagnosis present

## 2017-05-29 DIAGNOSIS — M1710 Unilateral primary osteoarthritis, unspecified knee: Secondary | ICD-10-CM | POA: Diagnosis not present

## 2017-05-29 DIAGNOSIS — D649 Anemia, unspecified: Secondary | ICD-10-CM | POA: Diagnosis not present

## 2017-05-29 DIAGNOSIS — M6281 Muscle weakness (generalized): Secondary | ICD-10-CM | POA: Diagnosis not present

## 2017-05-29 DIAGNOSIS — D72829 Elevated white blood cell count, unspecified: Principal | ICD-10-CM | POA: Diagnosis present

## 2017-05-29 DIAGNOSIS — M179 Osteoarthritis of knee, unspecified: Secondary | ICD-10-CM | POA: Diagnosis present

## 2017-05-29 LAB — COMPREHENSIVE METABOLIC PANEL
ALBUMIN: 3.6 g/dL (ref 3.5–5.0)
ALT: 11 U/L — ABNORMAL LOW (ref 14–54)
ANION GAP: 11 (ref 5–15)
AST: 17 U/L (ref 15–41)
Alkaline Phosphatase: 161 U/L — ABNORMAL HIGH (ref 38–126)
BILIRUBIN TOTAL: 0.6 mg/dL (ref 0.3–1.2)
BUN: 19 mg/dL (ref 6–20)
CO2: 25 mmol/L (ref 22–32)
Calcium: 9 mg/dL (ref 8.9–10.3)
Chloride: 97 mmol/L — ABNORMAL LOW (ref 101–111)
Creatinine, Ser: 1.07 mg/dL — ABNORMAL HIGH (ref 0.44–1.00)
GFR calc non Af Amer: 50 mL/min — ABNORMAL LOW (ref >60)
GFR, EST AFRICAN AMERICAN: 58 mL/min — AB (ref >60)
GLUCOSE: 91 mg/dL (ref 65–99)
Potassium: 4.1 mmol/L (ref 3.5–5.1)
SODIUM: 133 mmol/L — AB (ref 135–145)
TOTAL PROTEIN: 8 g/dL (ref 6.5–8.1)

## 2017-05-29 LAB — DIFFERENTIAL
Basophils Absolute: 0.1 10*3/uL (ref 0.0–0.1)
Basophils Relative: 0 %
EOS PCT: 0 %
Eosinophils Absolute: 0.2 10*3/uL (ref 0.0–0.7)
LYMPHS ABS: 2 10*3/uL (ref 0.7–4.0)
LYMPHS PCT: 4 %
Monocytes Absolute: 2.2 10*3/uL — ABNORMAL HIGH (ref 0.1–1.0)
Monocytes Relative: 4 %
NEUTROS PCT: 92 %
Neutro Abs: 48.8 10*3/uL — ABNORMAL HIGH (ref 1.7–7.7)

## 2017-05-29 LAB — LACTATE DEHYDROGENASE: LDH: 146 U/L (ref 98–192)

## 2017-05-29 LAB — URINALYSIS, ROUTINE W REFLEX MICROSCOPIC
BILIRUBIN URINE: NEGATIVE
Glucose, UA: NEGATIVE mg/dL
KETONES UR: NEGATIVE mg/dL
LEUKOCYTES UA: NEGATIVE
NITRITE: NEGATIVE
PH: 5 (ref 5.0–8.0)
PROTEIN: NEGATIVE mg/dL
Specific Gravity, Urine: 1.024 (ref 1.005–1.030)

## 2017-05-29 LAB — CBC
HEMATOCRIT: 34.8 % — AB (ref 36.0–46.0)
Hemoglobin: 11.5 g/dL — ABNORMAL LOW (ref 12.0–15.0)
MCH: 30.7 pg (ref 26.0–34.0)
MCHC: 33 g/dL (ref 30.0–36.0)
MCV: 92.8 fL (ref 78.0–100.0)
Platelets: 163 10*3/uL (ref 150–400)
RBC: 3.75 MIL/uL — ABNORMAL LOW (ref 3.87–5.11)
RDW: 14.9 % (ref 11.5–15.5)
WBC: 55.2 10*3/uL (ref 4.0–10.5)

## 2017-05-29 LAB — I-STAT CG4 LACTIC ACID, ED
Lactic Acid, Venous: 1.19 mmol/L (ref 0.5–1.9)
Lactic Acid, Venous: 0.95 mmol/L (ref 0.5–1.9)

## 2017-05-29 LAB — SEDIMENTATION RATE: Sed Rate: 57 mm/hr — ABNORMAL HIGH (ref 0–22)

## 2017-05-29 LAB — LIPASE, BLOOD: Lipase: 16 U/L (ref 11–51)

## 2017-05-29 LAB — PROCALCITONIN: Procalcitonin: 0.23 ng/mL

## 2017-05-29 MED ORDER — HYDROCODONE-ACETAMINOPHEN 5-325 MG PO TABS
1.0000 | ORAL_TABLET | Freq: Once | ORAL | Status: AC
  Administered 2017-05-29: 1 via ORAL
  Filled 2017-05-29: qty 1

## 2017-05-29 MED ORDER — GADOBENATE DIMEGLUMINE 529 MG/ML IV SOLN
20.0000 mL | Freq: Once | INTRAVENOUS | Status: AC | PRN
  Administered 2017-05-29: 20 mL via INTRAVENOUS

## 2017-05-29 MED ORDER — ALBUTEROL SULFATE (2.5 MG/3ML) 0.083% IN NEBU
2.5000 mg | INHALATION_SOLUTION | Freq: Four times a day (QID) | RESPIRATORY_TRACT | Status: DC | PRN
  Administered 2017-05-30 (×3): 2.5 mg via RESPIRATORY_TRACT
  Filled 2017-05-29 (×3): qty 3

## 2017-05-29 MED ORDER — LISINOPRIL 5 MG PO TABS
5.0000 mg | ORAL_TABLET | Freq: Every day | ORAL | Status: DC
  Administered 2017-05-30 – 2017-06-02 (×4): 5 mg via ORAL
  Filled 2017-05-29 (×5): qty 1

## 2017-05-29 MED ORDER — MOMETASONE FURO-FORMOTEROL FUM 200-5 MCG/ACT IN AERO
2.0000 | INHALATION_SPRAY | Freq: Two times a day (BID) | RESPIRATORY_TRACT | Status: DC
  Administered 2017-05-30 – 2017-06-03 (×9): 2 via RESPIRATORY_TRACT
  Filled 2017-05-29: qty 8.8

## 2017-05-29 MED ORDER — PANTOPRAZOLE SODIUM 40 MG PO TBEC
40.0000 mg | DELAYED_RELEASE_TABLET | Freq: Every day | ORAL | Status: DC
  Administered 2017-05-30 – 2017-06-03 (×5): 40 mg via ORAL
  Filled 2017-05-29 (×5): qty 1

## 2017-05-29 MED ORDER — MORPHINE SULFATE (PF) 4 MG/ML IV SOLN
4.0000 mg | Freq: Once | INTRAVENOUS | Status: DC
  Filled 2017-05-29: qty 1

## 2017-05-29 MED ORDER — ALBUTEROL SULFATE HFA 108 (90 BASE) MCG/ACT IN AERS: 2.0000 | INHALATION_SPRAY | Freq: Four times a day (QID) | RESPIRATORY_TRACT | Status: DC | PRN

## 2017-05-29 MED ORDER — ACETAMINOPHEN 325 MG PO TABS
650.0000 mg | ORAL_TABLET | Freq: Four times a day (QID) | ORAL | Status: DC | PRN
  Filled 2017-05-29: qty 2

## 2017-05-29 MED ORDER — SODIUM CHLORIDE 0.9 % IV SOLN
INTRAVENOUS | Status: AC
  Administered 2017-05-29: 23:00:00 via INTRAVENOUS

## 2017-05-29 MED ORDER — FUROSEMIDE 20 MG PO TABS
20.0000 mg | ORAL_TABLET | Freq: Every day | ORAL | Status: DC
  Administered 2017-05-30 – 2017-05-31 (×2): 20 mg via ORAL
  Filled 2017-05-29 (×2): qty 1

## 2017-05-29 MED ORDER — TRAMADOL HCL 50 MG PO TABS
50.0000 mg | ORAL_TABLET | Freq: Two times a day (BID) | ORAL | Status: DC | PRN
  Administered 2017-05-29 – 2017-05-31 (×2): 50 mg via ORAL
  Filled 2017-05-29 (×2): qty 1

## 2017-05-29 MED ORDER — AMLODIPINE BESYLATE 10 MG PO TABS
10.0000 mg | ORAL_TABLET | Freq: Every day | ORAL | Status: DC
  Administered 2017-05-30 – 2017-06-02 (×4): 10 mg via ORAL
  Filled 2017-05-29 (×5): qty 1

## 2017-05-29 MED ORDER — ASPIRIN EC 81 MG PO TBEC
81.0000 mg | DELAYED_RELEASE_TABLET | Freq: Every day | ORAL | Status: DC
  Administered 2017-05-30 – 2017-06-03 (×5): 81 mg via ORAL
  Filled 2017-05-29 (×5): qty 1

## 2017-05-29 MED ORDER — POTASSIUM CHLORIDE CRYS ER 10 MEQ PO TBCR
10.0000 meq | EXTENDED_RELEASE_TABLET | Freq: Every day | ORAL | Status: DC
  Administered 2017-05-30 – 2017-06-03 (×5): 10 meq via ORAL
  Filled 2017-05-29 (×5): qty 1

## 2017-05-29 MED ORDER — ACETAMINOPHEN 650 MG RE SUPP: 650.0000 mg | Freq: Four times a day (QID) | RECTAL | Status: DC | PRN

## 2017-05-29 MED ORDER — PRAVASTATIN SODIUM 20 MG PO TABS
20.0000 mg | ORAL_TABLET | Freq: Every day | ORAL | Status: DC
  Administered 2017-05-30 – 2017-06-03 (×5): 20 mg via ORAL
  Filled 2017-05-29 (×5): qty 1

## 2017-05-29 MED ORDER — IOPAMIDOL (ISOVUE-300) INJECTION 61%
INTRAVENOUS | Status: AC
  Administered 2017-05-29: 100 mL via INTRAVENOUS
  Filled 2017-05-29: qty 100

## 2017-05-29 MED ORDER — ENOXAPARIN SODIUM 60 MG/0.6ML ~~LOC~~ SOLN
60.0000 mg | Freq: Every day | SUBCUTANEOUS | Status: DC
  Administered 2017-05-29 – 2017-06-02 (×5): 60 mg via SUBCUTANEOUS
  Filled 2017-05-29 (×5): qty 0.6

## 2017-05-29 MED ORDER — SODIUM CHLORIDE 0.9 % IV BOLUS (SEPSIS)
1000.0000 mL | Freq: Once | INTRAVENOUS | Status: AC
  Administered 2017-05-29: 1000 mL via INTRAVENOUS

## 2017-05-29 NOTE — ED Notes (Signed)
WBC: 55.2  RN let Pt's primary RN know

## 2017-05-29 NOTE — Progress Notes (Signed)
Lovenox per Pharmacy for DVT Prophylaxis    Pharmacy has been consulted from dosing enoxaparin (lovenox) in this patient for DVT prophylaxis.  The pharmacist has reviewed pertinent labs (Hgb _11.5__; PLT__163_), patient weight (_136__kg) and renal function (CrCl_67__mL/min) and decided that enoxaparin _60_mg SQ Q24Hrs is appropriate for this patient.  The pharmacy department will sign off at this time.  Please reconsult pharmacy if status changes or for further issues.  Thank you  Cyndia Diver PharmD, BCPS  05/29/2017, 10:25 PM

## 2017-05-29 NOTE — ED Provider Notes (Signed)
[facility] DEPT Provider Note   CSN: [id number] Arrival date & time: 05/29/17  1226     History   Chief Complaint Chief Complaint  Patient presents with  . Back Pain    HPI Erika [name] is a 76 y.o. female.  HPI  [name] is a 76yo female with history of epidural mass (hemilaminectomy and decompression of tumor in epidural space 2002), lumbar epidural tuberculosis, hyperlipidemia, hypertension, morbid obesity, asthma, GERD who presents the emergency department for right lower back pain. Patient states that back pain began 2 days ago and has gradually worsened. No inciting injury or fall. It is located on the right side of her lower back and feels like a "spasm." Pain is 8/10 severity, worsened with ambulation. She has been unable to ambulate or stand today due to pain. Tried taking tramadol which she is chronically prescribed for lower back pain which helped her symptoms some. She lives independently, uses a walker at home. States that she is worried something is wrong with her kidneys. Reports she has increased urinary frequency because she has been drinking more water lately. Denies fever, night sweats, unexpected weight changes, loss of bladder/bowel control, numbness, weakness, saddle anesthesia, abdominal pain, N/V/D, dysuria, hematuria, shortness of breath, chest pain. Denies chronic steroid use, denies IVDU.   Past Medical History:  Diagnosis Date  . Anemia    with menses  . Asthma   . Back pain    status post surgery 2002  . Breast cyst    Excesion with FNA, begnin in 2004.   . Degenerative joint disease of spine    Imaging 2005,  Degenerative hypertrophic facet arthritis changes L4-5 and L5-S1..   . History of shingles    Recurrent with post herpetic neuralgia.   [name] Hypertension   . Lymphadenopathy    Of the mediastinum, Right side CXR 2008, not read on 2010 cxr.   . Menopause   . Obesity    BMI 54  . Psychosis ([place])    Secondary to prednisone.  . Shingles   . Stasis dermatitis    W/ LE edema, prviously on lasix now on mazxide.   . SVT (supraventricular tachycardia) [facility]) June 2009   one run while hospitalized  . Tuberculosis    active TB treated in 2002, hx of paraspinal lumbar TB,     Patient Active Problem List   Diagnosis Date Noted  . Leg swelling 02/23/2016  . Hand cramps 11/22/2015  . GERD (gastroesophageal reflux disease) 02/09/2013  . OA (osteoarthritis) of knee 01/29/2012  . OA (osteoarthritis of spine) 10/18/2011  . PAC (premature atrial contraction) 05/16/2011  . Healthcare maintenance 04/25/2011  . Hypokalemia 02/26/2008  . Moderate persistent asthma 02/10/2008  . Hyperlipidemia 08/04/2007  . H/O Herpes zoster 08/29/2006  . Obesity, Class III, BMI 40-49.9 (morbid obesity) ([place]) 08/29/2006  . History of breast cyst 08/29/2006  . Essential hypertension 08/27/2006  . 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 BREAST CYST ASPIRATION Left   . Breast cyst biopsy    . CHOLECYSTECTOMY  02/02/2012   Procedure: LAPAROSCOPIC CHOLECYSTECTOMY;  Surgeon: Zenovia Jarred, MD;  Location: [facility];  Service: General;  Laterality: N/A;  . Hemilaminectomy of L4, L5 and S1 decompression of tumor in epidural space  2000    OB History    No data available  Home Medications    Prior to Admission medications   Medication Sig Start Date End Date Taking? Authorizing Provider  albuterol (PROAIR HFA) 108 (90 Base) MCG/ACT inhaler inhale 1 to 2 puffs every 6 hours if needed 03/20/17   [name], MD  amLODipine (NORVASC) 10 MG tablet take 1 tablet by mouth once daily 02/01/17   [name], MD  aspirin EC 81 MG tablet Take 81 mg by mouth daily.    [provider]  Fluticasone-Salmeterol (ADVAIR DISKUS) 500-50 MCG/DOSE AEPB INHALE 1 PUFF BY  MOUTH TWICE A DAY 03/20/17   [name], MD  furosemide (LASIX) 20 MG tablet take 1 tablet by mouth once daily if needed for SWELLING IN LEGS OR FEET 03/26/17   [name], MD  lisinopril (PRINIVIL,ZESTRIL) 5 MG tablet Take 1 tablet (5 mg total) by mouth daily. 11/27/16 11/27/17  [name], MD  omeprazole (PRILOSEC) 20 MG capsule Take 1 capsule (20 mg total) by mouth daily. 03/20/17   [name], MD  potassium chloride SA (K-DUR,KLOR-CON) 20 MEQ tablet Take 0.5 tablets (10 mEq total) by mouth daily. 03/20/17   [name], MD  pravastatin (PRAVACHOL) 20 MG tablet Take 1 tablet (20 mg total) by mouth daily. 03/20/17   [name], MD  traMADol (ULTRAM) 50 MG tablet Take 1 tablet (50 mg total) by mouth every 12 (twelve) hours as needed. 03/20/17   [name], MD    Family History Family History  Problem Relation Age of Onset  . Stroke Mother        at young age  . Stroke Father        in 21's  . Heart attack Father   . Stroke Brother   . Stroke Brother   . Breast cancer Other     Social History Social History  Substance Use Topics  . Smoking status: Never Smoker  . Smokeless tobacco: Never Used  . Alcohol use No     Allergies   Prednisone   Review of Systems Review of Systems  Constitutional: Negative for chills, fatigue and fever.  Respiratory: Negative for shortness of breath.   Cardiovascular: Negative for chest pain.  Gastrointestinal: Negative for abdominal pain, diarrhea, nausea and vomiting.  Genitourinary: Positive for frequency. Negative for decreased urine volume, difficulty urinating, dysuria, flank pain, hematuria, urgency and vaginal discharge.  Musculoskeletal: Positive for back pain and gait problem (unable to ambulate due to pain). Negative for arthralgias, joint swelling, neck pain and neck stiffness.  Skin: Negative for rash and wound.  Neurological: Positive for weakness. Negative for dizziness, numbness and headaches.    Psychiatric/Behavioral: Negative for agitation.  All other systems reviewed and are negative.    Physical Exam Updated Vital Signs BP 130/82 (BP Location: Right Arm)   Pulse 100   Temp 98.6 F (37 C) (Oral)   Resp 16   SpO2 98%   Physical Exam  Constitutional: She is oriented to person, place, and time. She appears well-developed and well-nourished. No distress.  HENT:  Head: Normocephalic and atraumatic.  Mouth/Throat: Oropharynx is clear and moist. No oropharyngeal exudate.  Eyes: Pupils are equal, round, and reactive to light. Conjunctivae are normal. Right eye exhibits no discharge. Left eye exhibits no discharge.  Neck: Normal range of motion. Neck supple.  Cardiovascular: Normal rate, regular rhythm and intact distal pulses.  Exam reveals no friction rub.   No murmur heard. Pulmonary/Chest: Effort normal and breath sounds normal. No  respiratory distress. She has no wheezes. She has no rales.  Abdominal: Soft. Bowel sounds are normal. There is no tenderness. There is no guarding.  No CVA tenderness.  Musculoskeletal: Normal range of motion.  Midline surgical scar noted in the lumbar spine. Tenderness to palpation over right paraspinal muscles of the lumbar spine. No T-Spine or L-Spine tenderness. Strength 4/5 in bilateral LE. Patient unable to walk, stand or scoot in the bed due to pain. DP pulses 2+ bilaterally.    Lymphadenopathy:    She has no cervical adenopathy.  Neurological: She is alert and oriented to person, place, and time. Coordination normal.  Distal sensation to light/sharp touch intact in bilateral lower extremities. Patellar reflex 1+ in bilateral LE  Skin: Skin is warm and dry. Capillary refill takes less than 2 seconds. She is not diaphoretic.  No rashes.   Psychiatric: She has a normal mood and affect. Her behavior is normal.  Nursing note and vitals reviewed.    ED Treatments / Results  Labs (all labs ordered are listed, but only abnormal results  are displayed) Labs Reviewed - No data to display  EKG  EKG Interpretation None       Radiology No results found.  Procedures Procedures (including critical care time)  Medications Ordered in ED Medications - No data to display   Initial Impression / Assessment and Plan / ED Course  I have reviewed the triage vital signs and the nursing notes.  Pertinent labs & imaging results that were available during my care of the patient were reviewed by me and considered in my medical decision making (see chart for details).  Clinical Course as of May 29 2000  Wed May 29, 2017  1413 Patient cannot stand up due to pain, required three nurses and myself to get her moved up in the bed.   [ES]  6010 On recheck patient states pain is better, but only when she sits still. She has been refusing morphine because she thinks "that's too strong." Is requesting another Vicodin.   [ES]  [id number] Oncology consult.   [ES]  1909 Dr. Irene Limbo in Oncology suggests MRI and will see patient when admitted.   [ES]  1918 Dr. Maudie Mercury with hospitalist service will admit patient.   [ES]  1933 Patient and family at bedside informed on lab work and imaging results.   [ES]    Clinical Course User Index [ES] Glyn Ade, PA-C    Patient presents with right-sided lower back pain. She has a history of epidural mass with lumbar hemilaminectomy in 2000, per chart review mass is not malignant. Pain has been going on for two days and she is now unable to stand or walk due to pain.   Labs reviewed, patient has a WBC count of 55.2, last CBC was three years ago and normal. UA unremarkable for infection. CMP reveals elevated Alk Phos (161) Patient is afebrile, non-toxic appearing and VSS. Peripheral smear pending, suspect leukemia. Blood cultures, lactic acid and CXR ordered as well to evaluate for infection. She also has a small bump in Creatinine, fluids running.   Concern for lumbar spine mass or abscess given patient's  history and WBC. Discussed imaging with radiology who suggest CT abd/pelvis first given patient's size and if that doesn't show anything MRI. Discussed this patient with Dr. Alvino Chapel who agrees with the above plan.   CT scan unremarkable for intraabdominal process. CXR without evidence of pneumonia. No lactic acidosis and blood cultures pending  Dr. Maudie Mercury  will be admit patient to hospitalist service for further work-up of WBC count and for MRI. Dr. Thomasene Lot spoke with Dr. Irene Limbo in Oncology who will see the patient when admitted.    Final Clinical Impressions(s) / ED Diagnoses   Final diagnoses:  None    New Prescriptions New Prescriptions   No medications on file     Bernarda Caffey 05/30/17 1009    Davonna Belling, MD 05/30/17 [phone]

## 2017-05-29 NOTE — ED Triage Notes (Signed)
Patient here from home with complaints of lower back pain x2 days. Denies injury. Ambulatory.

## 2017-05-29 NOTE — ED Notes (Signed)
Bed: [facility] Expected date:  Expected time:  Means of arrival:  Comments: EMS-back pain 

## 2017-05-29 NOTE — H&P (Addendum)
TRH H&P   Patient Demographics:    Erika [name], is a 76 y.o. female  MRN: [id number]   DOB - 18-Apr-1941  Admit Date - 05/29/2017  Outpatient Primary MD for the patient is [name], MD  Referring MD/NP/PA:    Geanie Kenning   Outpatient Specialists:   Patient coming from:  home  Chief Complaint  Patient presents with  . Back Pain      HPI:    Erika [name]  is a 76 y.o. female, w hypertension, asthma, anemia, TB in 2002, apparently c/o back pain x2 days  w slight weakness of bilateral lower ext.  Pt denies numbness, tingling incontinence, fever, chills, nite sweats, wt loss,  N/v, abd pain, diarrhea, brbpr, dysuria, hematuria.  Pt presented to ED for worsening back pain, right sided.   In ED,  Wbc 55.2 Hgb 11.5, Plt 163.  Bun 19, Creatinine 1.07 Na 133, K 4.1    ED consulted oncology   Pt will be admitted for back pain, leukocytosis of uncertain etiology.      Review of systems:    In addition to the HPI above, No Fever-chills, No Headache, No changes with Vision or hearing, No problems swallowing food or Liquids, No Chest pain, Cough or Shortness of Breath, No Abdominal pain, No Nausea or Vommitting, Bowel movements are regular, No Blood in stool or Urine, No dysuria, No new skin rashes or bruises, No new joints pains-aches,  No new weakness, tingling, numbness in any extremity, No recent weight gain or loss, No polyuria, polydypsia or polyphagia, No significant Mental Stressors.  A full 10 point Review of Systems was done, except as stated above, all other Review of Systems were negative.   With Past History of the following :    Past Medical History:  Diagnosis Date  . Anemia    with menses  . Asthma   . Back pain    status post surgery 2002  . Breast cyst    Excesion with FNA, begnin in 2004.   . Degenerative joint disease of spine     Imaging 2005,  Degenerative hypertrophic facet arthritis changes L4-5 and L5-S1..   . History of shingles    Recurrent with post herpetic neuralgia.   [name] Hypertension   . Lymphadenopathy    Of the mediastinum, Right side CXR 2008, not read on 2010 cxr.   . Menopause   . Obesity    BMI 54  . 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  . Shingles   . Stasis dermatitis    W/ LE edema, prviously on lasix now on mazxide.   . SVT (supraventricular tachycardia) [facility]) June 2009   one run while hospitalized  . Tuberculosis    active TB treated in 2002, hx of paraspinal lumbar TB,       Past Surgical History:  Procedure Laterality Date  .  BREAST CYST ASPIRATION Left   . Breast cyst biopsy    . CHOLECYSTECTOMY  02/02/2012   Procedure: LAPAROSCOPIC CHOLECYSTECTOMY;  Surgeon: Zenovia Jarred, MD;  Location: [place];  Service: General;  Laterality: N/A;  . Hemilaminectomy of L4, L5 and S1 decompression of tumor in epidural space  2000      Social History:     Social History  Substance Use Topics  . Smoking status: Never Smoker  . Smokeless tobacco: Never Used  . Alcohol use No     Lives - at home  Mobility -  Walks by self.    Family History :     Family History  Problem Relation Age of Onset  . Stroke Mother        at young age  . Stroke Father        in 38's  . Heart attack Father   . Stroke Brother   . Stroke Brother   . Breast cancer Other       Home Medications:   Prior to Admission medications   Medication Sig Start Date End Date Taking? Authorizing Provider  albuterol (PROAIR HFA) 108 (90 Base) MCG/ACT inhaler inhale 1 to 2 puffs every 6 hours if needed 03/20/17  Yes [name], MD  amLODipine (NORVASC) 10 MG tablet take 1 tablet by mouth once daily 02/01/17  Yes [name], MD  aspirin EC 81 MG tablet Take 81 mg by mouth daily.   Yes [provider]  Fluticasone-Salmeterol (ADVAIR DISKUS) 500-50 MCG/DOSE AEPB INHALE 1 PUFF BY MOUTH  TWICE A DAY 03/20/17  Yes [name], MD  furosemide (LASIX) 20 MG tablet take 1 tablet by mouth once daily if needed for SWELLING IN LEGS OR FEET 03/26/17  Yes [name], MD  lisinopril (PRINIVIL,ZESTRIL) 5 MG tablet Take 1 tablet (5 mg total) by mouth daily. 11/27/16 11/27/17 Yes [name], MD  omeprazole (PRILOSEC) 20 MG capsule Take 1 capsule (20 mg total) by mouth daily. 03/20/17  Yes [name], MD  potassium chloride SA (K-DUR,KLOR-CON) 20 MEQ tablet Take 0.5 tablets (10 mEq total) by mouth daily. 03/20/17  Yes [name], MD  pravastatin (PRAVACHOL) 20 MG tablet Take 1 tablet (20 mg total) by mouth daily. 03/20/17  Yes [name], MD  traMADol (ULTRAM) 50 MG tablet Take 1 tablet (50 mg total) by mouth every 12 (twelve) hours as needed. 03/20/17  Yes [name], MD     Allergies:     Allergies  Allergen Reactions  . Prednisone Other (See Comments)    REACTION: Psychosis, talking out of head, insomnia     Physical Exam:   Vitals  Blood pressure (!) 106/57, pulse 94, temperature 98.6 F (37 C), temperature source Oral, resp. rate 14, SpO2 95 %.   1. General  lying in bed in NAD,   2. Normal affect and insight, Not Suicidal or Homicidal, Awake Alert, Oriented X 3.  3. No F.N deficits, ALL C.Nerves Intact, Strength 5/5 all 4 extremities, Sensation intact all 4 extremities, Plantars down going.  4. Ears and Eyes appear Normal, Conjunctivae clear, PERRLA. Moist Oral Mucosa.  5. Supple Neck, No JVD, No cervical lymphadenopathy appriciated, No Carotid Bruits.  6. Symmetrical Chest wall movement, Good air movement bilaterally, CTAB.  7. RRR, No Gallops, Rubs or Murmurs, No Parasternal Heave.  8. Positive Bowel Sounds, Abdomen Soft, No tenderness, No organomegaly appriciated,No rebound -guarding or rigidity.  9.  No  Cyanosis, Normal Skin Turgor, No Skin Rash or Bruise.  10. Good muscle tone,  joints appear normal , no effusions, Normal ROM.  11.  No Palpable Lymph Nodes in Neck or Axillae   No CVA tenderness No janeway, no osler, no splinter   Data Review:    CBC  Recent Labs Lab 05/29/17 1416  WBC 55.2*  HGB 11.5*  HCT 34.8*  PLT 163  MCV 92.8  MCH 30.7  MCHC 33.0  RDW 14.9  LYMPHSABS 2.0  MONOABS 2.2*  EOSABS 0.2  BASOSABS 0.1   ------------------------------------------------------------------------------------------------------------------  Chemistries   Recent Labs Lab 05/29/17 1416  NA 133*  K 4.1  CL 97*  CO2 25  GLUCOSE 91  BUN 19  CREATININE 1.07*  CALCIUM 9.0  AST 17  ALT 11*  ALKPHOS 161*  BILITOT 0.6   ------------------------------------------------------------------------------------------------------------------ CrCl cannot be calculated (Unknown ideal weight.). ------------------------------------------------------------------------------------------------------------------ No results for input(s): TSH, T4TOTAL, T3FREE, THYROIDAB in the last 72 hours.  Invalid input(s): FREET3  Coagulation profile No results for input(s): INR, PROTIME in the last 168 hours. ------------------------------------------------------------------------------------------------------------------- No results for input(s): DDIMER in the last 72 hours. -------------------------------------------------------------------------------------------------------------------  Cardiac Enzymes No results for input(s): CKMB, TROPONINI, MYOGLOBIN in the last 168 hours.  Invalid input(s): CK ------------------------------------------------------------------------------------------------------------------ No results found for: BNP   ---------------------------------------------------------------------------------------------------------------  Urinalysis    Component Value Date/Time   COLORURINE YELLOW 05/29/2017 [place] 05/29/2017 1416   LABSPEC 1.024 05/29/2017 1416   PHURINE 5.0 05/29/2017  1416   GLUCOSEU NEGATIVE 05/29/2017 1416   HGBUR SMALL (A) 05/29/2017 1416   BILIRUBINUR NEGATIVE 05/29/2017 1416   [place] 05/29/2017 1416   PROTEINUR NEGATIVE 05/29/2017 1416   UROBILINOGEN 1.0 09/09/2012 0051   NITRITE NEGATIVE 05/29/2017 1416   LEUKOCYTESUR NEGATIVE 05/29/2017 1416    ----------------------------------------------------------------------------------------------------------------   Imaging Results:    Ct Abdomen Pelvis W Contrast  Result Date: 05/29/2017 CLINICAL DATA:  Low back and abdominal pain for 2 weeks. EXAM: CT ABDOMEN AND PELVIS WITH CONTRAST TECHNIQUE: Multidetector CT imaging of the abdomen and pelvis was performed using the standard protocol following bolus administration of intravenous contrast. CONTRAST:  158m ISOVUE-300 IOPAMIDOL (ISOVUE-300) INJECTION 61% COMPARISON:  None. FINDINGS: Lower Chest: No acute findings. Hepatobiliary: No hepatic masses identified. Prior cholecystectomy. Stable mild biliary ductal dilatation, likely related to prior cholecystectomy. Pancreas:  No mass or inflammatory changes. Spleen: Within normal limits in size and appearance. Adrenals/Urinary Tract: No masses identified. No evidence of hydronephrosis. Stomach/Bowel: No evidence of obstruction, inflammatory process or abnormal fluid collections. Vascular/Lymphatic: No pathologically enlarged lymph nodes. No abdominal aortic aneurysm. Aortic atherosclerosis. Reproductive: 12 mm calcified fibroid in left uterine fundus. Adnexal regions are unremarkable. Other: Small right paraumbilical ventral hernia containing only fat. No evidence of herniated bowel loops. Musculoskeletal:  No suspicious bone lesions identified. IMPRESSION: No acute findings within the abdomen or pelvis. Small calcified uterine fibroid.  That Small right paraumbilical ventral hernia containing only fat. Electronically Signed   By: JEarle GellM.D.   On: 05/29/2017 16:58   Dg Chest Portable 1  View  Result Date: 05/29/2017 CLINICAL DATA:  C/o pain RLQ x 2 days, no chest pain or sob, no cough or congestion, hx. HTN, non smoker EXAM: PORTABLE CHEST 1 VIEW COMPARISON:  Chest radiograph 02/03/2012 FINDINGS: Stable large cardiac silhouette. There is central venous congestion. No effusion, infiltrate or pneumothorax. IMPRESSION: Cardiomegaly central venous congestion.  No clear acute findings. Electronically Signed   By: SSuzy BouchardM.D.   On:  05/29/2017 15:31      Assessment & Plan:    Principal Problem:   Leukocytosis Active Problems:   Anemia    Leukocytosis ? Leukemia , leukemoid reaction, r/o infection Blood culture x2 ESR Lactic acid LDH Appreciate oncology input  Anemia Check ferritin, iron, tibc, b12, folate, tsh, spep, upep Check cbc in am  Hyponatremia Check cortisol, serum osm, tsh, urine sodium, urine osm Hydrate with ns  Check cmp in am  Abnormal lft Check GGT Check cmp in am  Hypertension Continue lisinopril, amlodipine Cont furosemide  Hyperlipidemia Cont pravastatin  Asthma Cont advair   DVT Prophylaxis  Lovenox - SCDs  AM Labs Ordered, also please review Full Orders  Family Communication: Admission, patients condition and plan of care including tests being ordered have been discussed with the patient  who indicate understanding and agree with the plan and Code Status.  Code Status FULL CODE  Likely DC to  Home   Condition GUARDED    Consults called: oncology by ED  Admission status: inpatient  Time spent in minutes : 45    Jani Gravel M.D on 05/29/2017 at 8:37 PM  Between 7am to 7pm - Pager - [phone] . After 7pm go to [email or web address] - password [facility]  [facility] - Office  [phone]

## 2017-05-29 NOTE — ED Notes (Signed)
Call report to Jenny Reichmann [phone] @ 2025

## 2017-05-30 DIAGNOSIS — D649 Anemia, unspecified: Secondary | ICD-10-CM

## 2017-05-30 DIAGNOSIS — Z8611 Personal history of tuberculosis: Secondary | ICD-10-CM

## 2017-05-30 DIAGNOSIS — M545 Low back pain: Secondary | ICD-10-CM

## 2017-05-30 DIAGNOSIS — D72829 Elevated white blood cell count, unspecified: Principal | ICD-10-CM

## 2017-05-30 LAB — COMPREHENSIVE METABOLIC PANEL
ALT: 10 U/L — ABNORMAL LOW (ref 14–54)
AST: 21 U/L (ref 15–41)
Albumin: 3.2 g/dL — ABNORMAL LOW (ref 3.5–5.0)
Alkaline Phosphatase: 182 U/L — ABNORMAL HIGH (ref 38–126)
Anion gap: 9 (ref 5–15)
BUN: 16 mg/dL (ref 6–20)
CO2: 25 mmol/L (ref 22–32)
Calcium: 8.9 mg/dL (ref 8.9–10.3)
Chloride: 103 mmol/L (ref 101–111)
Creatinine, Ser: 0.96 mg/dL (ref 0.44–1.00)
GFR calc Af Amer: >60 mL/min (ref >60)
GFR calc non Af Amer: 57 mL/min — ABNORMAL LOW (ref >60)
Glucose, Bld: 79 mg/dL (ref 65–99)
Potassium: 3.7 mmol/L (ref 3.5–5.1)
Sodium: 137 mmol/L (ref 135–145)
Total Bilirubin: 0.5 mg/dL (ref 0.3–1.2)
Total Protein: 7.3 g/dL (ref 6.5–8.1)

## 2017-05-30 LAB — CBC WITH DIFFERENTIAL/PLATELET
Basophils Absolute: 0 10*3/uL (ref 0.0–0.1)
Basophils Relative: 0 %
Eosinophils Absolute: 0.5 10*3/uL (ref 0.0–0.7)
Eosinophils Relative: 1 %
HCT: 31.7 % — ABNORMAL LOW (ref 36.0–46.0)
Hemoglobin: 10.5 g/dL — ABNORMAL LOW (ref 12.0–15.0)
Lymphocytes Relative: 7 %
Lymphs Abs: 3.2 10*3/uL (ref 0.7–4.0)
MCH: 30.7 pg (ref 26.0–34.0)
MCHC: 33.1 g/dL (ref 30.0–36.0)
MCV: 92.7 fL (ref 78.0–100.0)
Monocytes Absolute: 2.3 10*3/uL — ABNORMAL HIGH (ref 0.1–1.0)
Monocytes Relative: 5 %
Neutro Abs: 39.1 10*3/uL — ABNORMAL HIGH (ref 1.7–7.7)
Neutrophils Relative %: 87 %
Platelets: 143 10*3/uL — ABNORMAL LOW (ref 150–400)
RBC: 3.42 MIL/uL — ABNORMAL LOW (ref 3.87–5.11)
RDW: 15 % (ref 11.5–15.5)
WBC: 45.1 10*3/uL — ABNORMAL HIGH (ref 4.0–10.5)

## 2017-05-30 LAB — SODIUM, URINE, RANDOM: Sodium, Ur: 10 mmol/L

## 2017-05-30 LAB — OSMOLALITY, URINE: Osmolality, Ur: 304 mOsm/kg (ref 300–900)

## 2017-05-30 LAB — C-REACTIVE PROTEIN: CRP: 6.3 mg/dL — ABNORMAL HIGH (ref <1.0)

## 2017-05-30 LAB — SEDIMENTATION RATE: SED RATE: 60 mm/h — AB (ref 0–22)

## 2017-05-30 LAB — IRON AND TIBC
Iron: 54 ug/dL (ref 28–170)
Saturation Ratios: 23 % (ref 10.4–31.8)
TIBC: 232 ug/dL — ABNORMAL LOW (ref 250–450)
UIBC: 178 ug/dL

## 2017-05-30 LAB — PATHOLOGIST SMEAR REVIEW

## 2017-05-30 LAB — FERRITIN: Ferritin: 159 ng/mL (ref 11–307)

## 2017-05-30 LAB — LACTATE DEHYDROGENASE: LDH: 178 U/L (ref 98–192)

## 2017-05-30 LAB — GAMMA GT: GGT: 22 U/L (ref 7–50)

## 2017-05-30 LAB — CORTISOL: Cortisol, Plasma: 6.6 ug/dL

## 2017-05-30 LAB — OSMOLALITY: Osmolality: 288 mOsm/kg (ref 275–295)

## 2017-05-30 LAB — VITAMIN B12: VITAMIN B 12: 722 pg/mL (ref 180–914)

## 2017-05-30 MED ORDER — ALBUTEROL SULFATE (2.5 MG/3ML) 0.083% IN NEBU
2.5000 mg | INHALATION_SOLUTION | Freq: Two times a day (BID) | RESPIRATORY_TRACT | Status: DC
  Administered 2017-05-31 – 2017-06-03 (×7): 2.5 mg via RESPIRATORY_TRACT
  Filled 2017-05-30 (×7): qty 3

## 2017-05-30 MED ORDER — METHOCARBAMOL 1000 MG/10ML IJ SOLN
500.0000 mg | Freq: Four times a day (QID) | INTRAVENOUS | Status: DC | PRN
  Administered 2017-05-30 – 2017-05-31 (×2): 500 mg via INTRAVENOUS
  Filled 2017-05-30 (×2): qty 550
  Filled 2017-05-30: qty 5

## 2017-05-30 MED ORDER — SODIUM CHLORIDE 0.9 % IV SOLN
INTRAVENOUS | Status: AC
  Administered 2017-05-30: 18:00:00 via INTRAVENOUS

## 2017-05-30 NOTE — Progress Notes (Signed)
PROGRESS NOTE    [name]  [name]:[id number] DOB: [birth year] DOA: 05/29/2017 PCP: [name], MD    Brief Narrative: Erika [name]  is a 76 y.o. female, w hypertension, asthma, anemia, TB in 2002, apparently c/o back pain x2 days. She underwent MRI of the lumbar spine showing Left laminectomy defects at the L4 through S1 levels. 2. Mild degenerative changes at the L3-4, L4-5 and L5-S1 levels, as described above. 3. No focal disc protrusions or extrusions and no evidence of neural compression any level.  She was also found to have leukocytosis of unclear etiology.  Hematology consulted and awaiting recommendations. So far no source of infection found.   Assessment & Plan:   Principal Problem:   Leukocytosis Active Problems:   Anemia   Back pain:  Possibly muscle spasms.  Added robaxin.  Pain control and physical therapy evaluation.    Leukocytosis: Differential show elevated absolute neutrophil count.    Anemia, normocytic.  Unclear etiology.  Anemia panel ordered.    Mild thrombocytopenia: No evidence of bleeding.    Hypertension:  Controlled.   Asthma:  No wheezing heard.     DVT prophylaxis: (Lovenox) Code Status: full code.  Family Communication: NONE at bedside.  Disposition Plan: pending eval by hematology and PT.    Consultants:  Oncology hematology.    Procedures: none.    Antimicrobials: none.    Subjective: Back pain on the right.   Objective: Vitals:   05/30/17 0542 05/30/17 0851 05/30/17 0900 05/30/17 1300  BP:    (!) 107/50  Pulse:    (!) 106  Resp:    18  Temp:    98.1 F (36.7 C)  TempSrc:    Oral  SpO2: 95% 90% 90% 97%  Weight:      Height:        Intake/Output Summary (Last 24 hours) at 05/30/17 1507 Last data filed at 05/30/17 1438  Gross per 24 hour  Intake             1325 ml  Output             1850 ml  Net             -525 ml   Filed Weights   05/29/17 2215  Weight: (!) 136.2 kg (300 lb 4.3 oz)     Examination:  General exam: Appears calm and comfortable  Respiratory system: Clear to auscultation. Respiratory effort normal. Cardiovascular system: S1 & S2 heard, RRR. No JVD, murmurs, rubs, gallops or clicks. No pedal edema. Gastrointestinal system: Abdomen is nondistended, soft and nontender. No organomegaly or masses felt. Normal bowel sounds heard. Central nervous system: Alert and oriented. No focal neurological deficits. Extremities: Symmetric 5 x 5 power. Skin: No rashes, lesions or ulcers Psychiatry: Judgement and insight appear normal. Mood & affect appropriate.     Data Reviewed: I have personally reviewed following labs and imaging studies  CBC:  Recent Labs Lab 05/29/17 1416 05/30/17 0439  WBC 55.2* 45.1*  NEUTROABS 48.8* 39.1*  HGB 11.5* 10.5*  HCT 34.8* 31.7*  MCV 92.8 92.7  PLT 163 425*   Basic Metabolic Panel:  Recent Labs Lab 05/29/17 1416 05/30/17 0439  NA 133* 137  K 4.1 3.7  CL 97* 103  CO2 25 25  GLUCOSE 91 79  BUN 19 16  CREATININE 1.07* 0.96  CALCIUM 9.0 8.9   GFR: Estimated Creatinine Clearance: 75.3 mL/min (by C-G formula based on SCr of 0.96 mg/dL). Liver Function  Tests:  Recent Labs Lab 05/29/17 1416 05/30/17 0439  AST 17 21  ALT 11* 10*  ALKPHOS 161* 182*  BILITOT 0.6 0.5  PROT 8.0 7.3  ALBUMIN 3.6 3.2*    Recent Labs Lab 05/29/17 1416  LIPASE 16   No results for input(s): AMMONIA in the last 168 hours. Coagulation Profile: No results for input(s): INR, PROTIME in the last 168 hours. Cardiac Enzymes: No results for input(s): CKTOTAL, CKMB, CKMBINDEX, TROPONINI in the last 168 hours. BNP (last 3 results) No results for input(s): PROBNP in the last 8760 hours. HbA1C: No results for input(s): HGBA1C in the last 72 hours. CBG: No results for input(s): GLUCAP in the last 168 hours. Lipid Profile: No results for input(s): CHOL, HDL, LDLCALC, TRIG, CHOLHDL, LDLDIRECT in the last 72 hours. Thyroid Function  Tests: No results for input(s): TSH, T4TOTAL, FREET4, T3FREE, THYROIDAB in the last 72 hours. Anemia Panel:  Recent Labs  05/30/17 0439  VITAMINB12 722  FERRITIN 159  TIBC 232*  IRON 54   Sepsis Labs:  Recent Labs Lab 05/29/17 1509 05/29/17 1718 05/29/17 2232  PROCALCITON  --   --  0.23  LATICACIDVEN 1.19 0.95  --     Recent Results (from the past 240 hour(s))  Blood culture (routine x 2)     Status: None (Preliminary result)   Collection Time: 05/29/17  3:00 PM  Result Value Ref Range Status   Specimen Description BLOOD RIGHT ANTECUBITAL  Final   Special Requests   Final    BOTTLES DRAWN AEROBIC AND ANAEROBIC Blood Culture adequate volume   Culture   Final    NO GROWTH < 24 HOURS Performed at [facility], [place] [address], [place], [place] [address]    Report Status PENDING  Incomplete  Blood culture (routine x 2)     Status: None (Preliminary result)   Collection Time: 05/29/17  3:05 PM  Result Value Ref Range Status   Specimen Description BLOOD LEFT HAND  Final   Special Requests   Final    BOTTLES DRAWN AEROBIC AND ANAEROBIC Blood Culture adequate volume   Culture   Final    NO GROWTH < 24 HOURS Performed at [facility], [place] [address], [place], [place] [address]    Report Status PENDING  Incomplete         Radiology Studies: Mr Lumbar Spine W Wo Contrast  Result Date: 05/29/2017 CLINICAL DATA:  Back pain for the past 2 days. 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 EXAM: MRI LUMBAR SPINE WITHOUT AND WITH CONTRAST TECHNIQUE: Multiplanar and multiecho pulse sequences of the lumbar spine were obtained without and with intravenous contrast. CONTRAST:  60mL MULTIHANCE GADOBENATE DIMEGLUMINE 529 MG/ML IV SOLN COMPARISON:  Abdomen pelvis CT obtained today. FINDINGS: Segmentation: 5 non-rib-bearing lumbar vertebrae with the last open disc space at the L5-S1 level. Alignment:  Normal. Vertebrae:  Left-sided laminectomy defects extending from the L4 to the S1 level. No fractures or marrow edema. Conus medullaris: Extends to the mid L2 level and appears normal. Paraspinal and other soft tissues: Unremarkable. Diffuse disc degeneration throughout the lumbar and lower thoracic spine. Disc levels: L1-2: Normal. L2-3: Normal. L3-4: Mild diffuse posterior disc protrusion. Bilateral ligamentum flavum hypertrophy. These changes are producing mild canal stenosis without significant foraminal stenosis. L4-5: Mild to moderate diffuse posterior disc protrusion. Moderate ligamentum flavum hypertrophy on the right. These changes are producing mild-to-moderate foraminal stenosis on  the right. The disc bulging is causing mild foraminal stenosis on the left. There is also mild canal stenosis. L5-S1: Minimal diffuse posterior disc protrusion. Mild ligamentum flavum hypertrophy on the right. Mild to moderate facet hypertrophy on the right. These changes are producing mild foraminal stenosis on the right and minimal foraminal stenosis on the left. No significant canal stenosis. IMPRESSION: 1. Left laminectomy defects at the L4 through S1 levels. 2. Mild degenerative changes at the L3-4, L4-5 and L5-S1 levels, as described above. 3. No focal disc protrusions or extrusions and no evidence of neural compression any level. 4. No evidence of tumor recurrence. Electronically Signed   By: Claudie Revering M.D.   On: 05/29/2017 21:46   Ct Abdomen Pelvis W Contrast  Result Date: 05/29/2017 CLINICAL DATA:  Low back and abdominal pain for 2 weeks. EXAM: CT ABDOMEN AND PELVIS WITH CONTRAST TECHNIQUE: Multidetector CT imaging of the abdomen and pelvis was performed using the standard protocol following bolus administration of intravenous contrast. CONTRAST:  144mL ISOVUE-300 IOPAMIDOL (ISOVUE-300) INJECTION 61% COMPARISON:  None. FINDINGS: Lower Chest: No acute findings. Hepatobiliary: No hepatic masses identified. Prior cholecystectomy.  Stable mild biliary ductal dilatation, likely related to prior cholecystectomy. Pancreas:  No mass or inflammatory changes. Spleen: Within normal limits in size and appearance. Adrenals/Urinary Tract: No masses identified. No evidence of hydronephrosis. Stomach/Bowel: No evidence of obstruction, inflammatory process or abnormal fluid collections. Vascular/Lymphatic: No pathologically enlarged lymph nodes. No abdominal aortic aneurysm. Aortic atherosclerosis. Reproductive: 12 mm calcified fibroid in left uterine fundus. Adnexal regions are unremarkable. Other: Small right paraumbilical ventral hernia containing only fat. No evidence of herniated bowel loops. Musculoskeletal:  No suspicious bone lesions identified. IMPRESSION: No acute findings within the abdomen or pelvis. Small calcified uterine fibroid.  That Small right paraumbilical ventral hernia containing only fat. Electronically Signed   By: Earle Gell M.D.   On: 05/29/2017 16:58   Dg Chest Portable 1 View  Result Date: 05/29/2017 CLINICAL DATA:  C/o pain RLQ x 2 days, no chest pain or sob, no cough or congestion, hx. HTN, non smoker EXAM: PORTABLE CHEST 1 VIEW COMPARISON:  Chest radiograph 02/03/2012 FINDINGS: Stable large cardiac silhouette. There is central venous congestion. No effusion, infiltrate or pneumothorax. IMPRESSION: Cardiomegaly central venous congestion.  No clear acute findings. Electronically Signed   By: Suzy Bouchard M.D.   On: 05/29/2017 15:31        Scheduled Meds: . amLODipine  10 mg Oral Daily  . aspirin EC  81 mg Oral Daily  . enoxaparin (LOVENOX) injection  60 mg Subcutaneous QHS  . furosemide  20 mg Oral Daily  . lisinopril  5 mg Oral Daily  . mometasone-formoterol  2 puff Inhalation BID  .  morphine injection  4 mg Intravenous Once  . pantoprazole  40 mg Oral Daily  . potassium chloride SA  10 mEq Oral Daily  . pravastatin  20 mg Oral Daily   Continuous Infusions: . sodium chloride 75 mL/hr at 05/30/17  1115  . methocarbamol (ROBAXIN)  IV 500 mg (05/30/17 1327)     LOS: 1 day    Time spent: 35 minutes.     Hosie Poisson, MD [facility] Pager [phone] If 7PM-7AM, please contact night-coverage [email or web address] Password [email or web address] 05/30/2017, 3:07 PM

## 2017-05-30 NOTE — Progress Notes (Signed)
HEMATOLOGY/ONCOLOGY INPATIENT CONSULT NOTE  Date of Service: 05/30/2017  Inpatient Attending: .Hosie Poisson, MD  CC: leucocytosis  HPI  Patient is a very pleasant 76 year old female with a history of morbid obesity, anemia, asthma, hypertension, lumbar spinal TB in 2002 who presented with acute onset of lower back pain on Monday.   She reports that she drank some pepsi and tea on that day and her back pain began shortly after. No initial injury to precipitate her back pain. It gradually worsened until she was seen in the ED on 05/30/17. Labs during admission revealed WBC count of 55.2k with predominantly neutrophilia of 48.8k and some monocytosis at 2.2k.   She had minimal anemia at 11.5 with an MCV of 92 and a normal platelet count of 163k.  At that time she had CT A/P while in the ED which was normal and without splenomegaly. Additionally, she also had MRI of the L-spine which only showed degenerative changes. She was subsequently admitted for further workup and pain control and to rule out a source of infection.   Of note, the patient has a remote prior h/o epidural mass in 2002 (hemilaminectomy and decompression of tumor in epidural space 2002) which was thought to be secondary to lumbar epidural tuberculosis. She was placed on a course of antibiotics for several months following her diagnosis.   Following her admission, we were consulted for further workup. No recent new medications. She reports that her inhaler does contain steroids, but otherwise she has not been on a course of steroids recently. She is UTD with her regular cancer screenings. She does not feel any different now than she did 47moto a year ago.  No focal symptoms suggestive of infection other than the back pain at this time.  On ROS, she denies fever, chills, night sweats, unexpected weight loss, abdominal pain, sore throat, joint swelling, or any other associated symptoms. Her back pain is much improved with muscle  relaxants. She does note some right great toe pain, but otherwise she has no other acute symptoms.   OBJECTIVE:  NAD  PHYSICAL EXAMINATION: . Vitals:   05/30/17 1942 05/30/17 2017 05/31/17 0508 05/31/17 0911  BP: 120/88  (!) 119/51   Pulse: 98  (!) 105   Resp: 18  18   Temp: 98.4 F (36.9 C)  98.6 F (37 C)   TempSrc: Oral  Oral   SpO2: 97% 94% 95% 96%  Weight:   (!) 306 lb 3.5 oz (138.9 kg)   Height:       Filed Weights   05/29/17 2215 05/31/17 0508  Weight: (!) 300 lb 4.3 oz (136.2 kg) (!) 306 lb 3.5 oz (138.9 kg)   .Body mass index is 45.66 kg/m.  GENERAL:alert, in no acute distress and comfortable SKIN: skin color, texture, turgor are normal, no rashes or significant lesions EYES: normal, conjunctiva are pink and non-injected, sclera clear OROPHARYNX:no exudate, no erythema and lips, buccal mucosa, and tongue normal  NECK: supple, no JVD, thyroid normal size, non-tender, without nodularity LYMPH:  no apparent palpable lymphadenopathy in the cervical, axillary or inguinal LUNGS: clear to auscultation with normal respiratory effort HEART: regular rate & rhythm,  no murmurs and no lower extremity edema ABDOMEN: abdomen obese soft, non-tender,distant bowel sounds , no overly palpable hepato-splenomegaly Musculoskeletal: no cyanosis of digits and no clubbing  PSYCH: alert & oriented x 3 with fluent speech NEURO: no focal motor/sensory deficits  MEDICAL HISTORY:  Past Medical History:  Diagnosis Date  . Anemia  with menses  . Asthma   . Back pain    status post surgery 2002  . Breast cyst    Excesion with FNA, begnin in 2004.   . Degenerative joint disease of spine    Imaging 2005,  Degenerative hypertrophic facet arthritis changes L4-5 and L5-S1..   . History of shingles    Recurrent with post herpetic neuralgia.   [name] Hypertension   . Lymphadenopathy    Of the mediastinum, Right side CXR 2008, not read on 2010 cxr.   . Menopause   . Obesity    BMI 54  .  Psychosis ([place])    Secondary to prednisone.  . Shingles   . Stasis dermatitis    W/ LE edema, prviously on lasix now on mazxide.   . SVT (supraventricular tachycardia) [facility]) June 2009   one run while hospitalized  . Tuberculosis    active TB treated in 2002, hx of paraspinal lumbar TB,     SURGICAL HISTORY: Past Surgical History:  Procedure Laterality Date  . BREAST CYST ASPIRATION Left   . Breast cyst biopsy    . CHOLECYSTECTOMY  02/02/2012   Procedure: LAPAROSCOPIC CHOLECYSTECTOMY;  Surgeon: Zenovia Jarred, MD;  Location: [facility];  Service: General;  Laterality: N/A;  . Hemilaminectomy of L4, L5 and S1 decompression of tumor in epidural space  2000    SOCIAL HISTORY: Social History   Social History  . Marital status: Widowed    Spouse name: N/A  . Number of children: N/A  . Years of education: N/A   Occupational History  . 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 volunteers at local school    Social History Main Topics  . Smoking status: Never Smoker  . Smokeless tobacco: Never Used  . Alcohol use No  . Drug use: No  . Sexual activity: No   Other Topics Concern  . Not on file   Social History Narrative   No regular exercise, retired, single, volunteers at a local school.     FAMILY HISTORY: Family History  Problem Relation Age of Onset  . Stroke Mother        at young age  . Stroke Father        in 8's  . Heart attack Father   . Stroke Brother   . Stroke Brother   . Breast cancer Other     ALLERGIES:  is allergic to prednisone.  MEDICATIONS:  Scheduled Meds: . albuterol  2.5 mg Nebulization BID  . amLODipine  10 mg Oral Daily  . aspirin EC  81 mg Oral Daily  . enoxaparin (LOVENOX) injection  60 mg Subcutaneous QHS  . lisinopril  5 mg Oral Daily  . mometasone-formoterol  2 puff Inhalation BID  .  morphine injection  4 mg Intravenous Once  . pantoprazole  40 mg Oral Daily  . potassium chloride SA  10 mEq Oral Daily  . pravastatin  20 mg Oral  Daily   Continuous Infusions: . sodium chloride 75 mL/hr at 05/30/17 1115  . methocarbamol (ROBAXIN)  IV 500 mg (05/30/17 1327)   PRN Meds:.acetaminophen **OR** acetaminophen, albuterol, methocarbamol (ROBAXIN)  IV, traMADol  REVIEW OF SYSTEMS:    10 Point review of Systems was done is negative except as noted above.   LABORATORY DATA:  I have reviewed the data as listed  . CBC Latest Ref Rng & Units 05/30/2017 05/29/2017 01/28/2014  WBC 4.0 - 10.5 K/uL 45.1(H) 55.2(HH) 5.7  Hemoglobin 12.0 -  15.0 g/dL 10.5(L) 11.5(L) 12.8  Hematocrit 36.0 - 46.0 % 31.7(L) 34.8(L) 39.0  Platelets 150 - 400 K/uL 143(L) 163 238    . CMP Latest Ref Rng & Units 05/30/2017 05/29/2017 03/20/2017  Glucose 65 - 99 mg/dL 79 91 66  BUN 6 - 20 mg/dL 16 19 13   Creatinine 0.44 - 1.00 mg/dL 0.96 1.07(H) 0.86  Sodium 135 - 145 mmol/L 137 133(L) 141  Potassium 3.5 - 5.1 mmol/L 3.7 4.1 4.4  Chloride 101 - 111 mmol/L 103 97(L) 102  CO2 22 - 32 mmol/L 25 25 22   Calcium 8.9 - 10.3 mg/dL 8.9 9.0 9.2  Total Protein 6.5 - 8.1 g/dL 7.3 8.0 -  Total Bilirubin 0.3 - 1.2 mg/dL 0.5 0.6 -  Alkaline Phos 38 - 126 U/L 182(H) 161(H) -  AST 15 - 41 U/L 21 17 -  ALT 14 - 54 U/L 10(L) 11(L) -     RADIOGRAPHIC STUDIES: I have personally reviewed the radiological images as listed and agreed with the findings in the report. Mr Lumbar Spine W Wo Contrast  Result Date: 05/29/2017 CLINICAL DATA:  Back pain for the past 2 days. 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 EXAM: MRI LUMBAR SPINE WITHOUT AND WITH CONTRAST TECHNIQUE: Multiplanar and multiecho pulse sequences of the lumbar spine were obtained without and with intravenous contrast. CONTRAST:  78m MULTIHANCE GADOBENATE DIMEGLUMINE 529 MG/ML IV SOLN COMPARISON:  Abdomen pelvis CT obtained today. FINDINGS: Segmentation: 5 non-rib-bearing lumbar vertebrae with the last open disc space at the L5-S1 level. Alignment:   Normal. Vertebrae: Left-sided laminectomy defects extending from the L4 to the S1 level. No fractures or marrow edema. Conus medullaris: Extends to the mid L2 level and appears normal. Paraspinal and other soft tissues: Unremarkable. Diffuse disc degeneration throughout the lumbar and lower thoracic spine. Disc levels: L1-2: Normal. L2-3: Normal. L3-4: Mild diffuse posterior disc protrusion. Bilateral ligamentum flavum hypertrophy. These changes are producing mild canal stenosis without significant foraminal stenosis. L4-5: Mild to moderate diffuse posterior disc protrusion. Moderate ligamentum flavum hypertrophy on the right. These changes are producing mild-to-moderate foraminal stenosis on the right. The disc bulging is causing mild foraminal stenosis on the left. There is also mild canal stenosis. L5-S1: Minimal diffuse posterior disc protrusion. Mild ligamentum flavum hypertrophy on the right. Mild to moderate facet hypertrophy on the right. These changes are producing mild foraminal stenosis on the right and minimal foraminal stenosis on the left. No significant canal stenosis. IMPRESSION: 1. Left laminectomy defects at the L4 through S1 levels. 2. Mild degenerative changes at the L3-4, L4-5 and L5-S1 levels, as described above. 3. No focal disc protrusions or extrusions and no evidence of neural compression any level. 4. No evidence of tumor recurrence. Electronically Signed   By: SClaudie ReveringM.D.   On: 05/29/2017 21:46   Ct Abdomen Pelvis W Contrast  Result Date: 05/29/2017 CLINICAL DATA:  Low back and abdominal pain for 2 weeks. EXAM: CT ABDOMEN AND PELVIS WITH CONTRAST TECHNIQUE: Multidetector CT imaging of the abdomen and pelvis was performed using the standard protocol following bolus administration of intravenous contrast. CONTRAST:  1025mISOVUE-300 IOPAMIDOL (ISOVUE-300) INJECTION 61% COMPARISON:  None. FINDINGS: Lower Chest: No acute findings. Hepatobiliary: No hepatic masses identified. Prior  cholecystectomy. Stable mild biliary ductal dilatation, likely related to prior cholecystectomy. Pancreas:  No mass or inflammatory changes. Spleen: Within normal limits in size and appearance. Adrenals/Urinary Tract: No masses identified. No evidence  of hydronephrosis. Stomach/Bowel: No evidence of obstruction, inflammatory process or abnormal fluid collections. Vascular/Lymphatic: No pathologically enlarged lymph nodes. No abdominal aortic aneurysm. Aortic atherosclerosis. Reproductive: 12 mm calcified fibroid in left uterine fundus. Adnexal regions are unremarkable. Other: Small right paraumbilical ventral hernia containing only fat. No evidence of herniated bowel loops. Musculoskeletal:  No suspicious bone lesions identified. IMPRESSION: No acute findings within the abdomen or pelvis. Small calcified uterine fibroid.  That Small right paraumbilical ventral hernia containing only fat. Electronically Signed   By: Earle Gell M.D.   On: 05/29/2017 16:58   Dg Chest Portable 1 View  Result Date: 05/29/2017 CLINICAL DATA:  C/o pain RLQ x 2 days, no chest pain or sob, no cough or congestion, hx. HTN, non smoker EXAM: PORTABLE CHEST 1 VIEW COMPARISON:  Chest radiograph 02/03/2012 FINDINGS: Stable large cardiac silhouette. There is central venous congestion. No effusion, infiltrate or pneumothorax. IMPRESSION: Cardiomegaly central venous congestion.  No clear acute findings. Electronically Signed   By: Suzy Bouchard M.D.   On: 05/29/2017 15:31    ASSESSMENT & PLAN:  Erika [name] is a delightful, well-appearing 76 y.o. woman who was admitted recently and found to have:   #1 Leukocytosis?predominantly Neutrophilia Patient presented with a WBC count of 55.2k with 48.8k neutrophils. Unknown chronicity of leukocytosis. Peripheral blood smear personally reviewed-shows significant neutrophilia with left shift.  Increased bands with a few myelocytes and metamyelocytes.  Pelgeroid neutrophils.  No significant  increased blasts.  Monocytosis present.  Patient has significantly elevated CRP levels of 6.3 and a sed rate of 57. Pro-calcitonin level at baseline was not impressively elevated and was at 0.23.  No overt evidence of infection source at this time blood cultures were sent and are currently pending. Chest x-ray showed cardiomegaly with central venous congestion with no acute evidence of pneumonia UA. Does not suggest overt UTI. CT abdomen done for workup of her back pain did not show any acute intra-abdominal or pelvic findings. He does not have any overt splenomegaly on her CT abdomen -which is somewhat argues against a chronic myeloproliferative disorder though does not rule this out.  MRI of the lumbar spine shows old surgical and degenerative changes but no evidence of tumor or infectious collections.  #2 mild normocytic anemia Plan -I discussed all the clinical findings and available labs thus far with the patient- -patient is having a workup to rule out an acute infectious process or abscess.  No overt source of infection noted at this time.  Blood cultures pending.  -Could consider getting a CT of the chest to rule out any occult pulmonary process and to rule out lymphadenopathy.  CT abdomen pelvis did not show any lymphadenopathy or overt sources of infection. -If no infectious process is found this could potentially represent a myeloproliferative process such as CMML especially with the monocytosis though we do not know if this is chronic at this point.  No overt increase in blasts in the peripheral blood. -We will send out a Jak 2 and BCR-ABL mutation studies to check for clonal leukocytosis. -Myeloma panel and serum free light chains -Other possibility is that this could be a paraneoplastic leukocytosis associated with a lymphoproliferative disorder or a solid tumor.  This is not clinically apparent at this time.  CT chest would rule out presence of lymphadenopathy in the chest and  other possible tumors to complete this workup.  LDH levels were within normal limits and did not suggest a high-grade lymphoma. -If an infectious process is  ruled out and the patient is an okay she could potentially be discharged and followed up in clinic to follow-up her clinical markers and for consideration of further workup with a bone marrow biopsy. -She will need to make sure she continues follow-up with her primary care physician to have age-appropriate and symptom directed cancer screening -We will plan to follow her up in clinic in 7-10 days.  Will send a message to our schedulers.    Sullivan Lone MD MS AAHIVMS [facility] Hematology/Oncology Physician [facility]  (Office):       [phone] (Work cell):  [phone] (Fax):           [phone]  05/30/2017 3:13 PM  This document serves as a record of services personally performed by Sullivan Lone, MD. It was created on his behalf by Reola Mosher, a trained medical scribe. The creation of this record is based on the scribe's personal observations and the provider's statements to them. This document has been checked and approved by the attending provider.

## 2017-05-31 ENCOUNTER — Inpatient Hospital Stay (HOSPITAL_COMMUNITY): Payer: PPO

## 2017-05-31 LAB — CBC
HCT: 31.6 % — ABNORMAL LOW (ref 36.0–46.0)
Hemoglobin: 10.4 g/dL — ABNORMAL LOW (ref 12.0–15.0)
MCH: 30.7 pg (ref 26.0–34.0)
MCHC: 32.9 g/dL (ref 30.0–36.0)
MCV: 93.2 fL (ref 78.0–100.0)
PLATELETS: 165 10*3/uL (ref 150–400)
RBC: 3.39 MIL/uL — AB (ref 3.87–5.11)
RDW: 14.9 % (ref 11.5–15.5)
WBC: 47 10*3/uL — AB (ref 4.0–10.5)

## 2017-05-31 LAB — FOLATE RBC
FOLATE, HEMOLYSATE: 234.9 ng/mL
FOLATE, RBC: 710 ng/mL (ref >498)
HEMATOCRIT: 33.1 % — AB (ref 34.0–46.6)

## 2017-05-31 LAB — BASIC METABOLIC PANEL
ANION GAP: 10 (ref 5–15)
BUN: 11 mg/dL (ref 6–20)
CO2: 24 mmol/L (ref 22–32)
Calcium: 8.7 mg/dL — ABNORMAL LOW (ref 8.9–10.3)
Chloride: 104 mmol/L (ref 101–111)
Creatinine, Ser: 0.85 mg/dL (ref 0.44–1.00)
Glucose, Bld: 84 mg/dL (ref 65–99)
POTASSIUM: 3.6 mmol/L (ref 3.5–5.1)
SODIUM: 138 mmol/L (ref 135–145)

## 2017-05-31 LAB — PROTEIN ELECTROPHORESIS, SERUM
A/G Ratio: 0.7 (ref 0.7–1.7)
ALPHA-2-GLOBULIN: 0.7 g/dL (ref 0.4–1.0)
Albumin ELP: 2.9 g/dL (ref 2.9–4.4)
Alpha-1-Globulin: 0.3 g/dL (ref 0.0–0.4)
BETA GLOBULIN: 1 g/dL (ref 0.7–1.3)
GAMMA GLOBULIN: 2 g/dL — AB (ref 0.4–1.8)
Globulin, Total: 4 g/dL — ABNORMAL HIGH (ref 2.2–3.9)
Total Protein ELP: 6.9 g/dL (ref 6.0–8.5)

## 2017-05-31 MED ORDER — IOPAMIDOL (ISOVUE-300) INJECTION 61%
INTRAVENOUS | Status: AC
  Filled 2017-05-31: qty 75

## 2017-05-31 MED ORDER — IOPAMIDOL (ISOVUE-300) INJECTION 61%
75.0000 mL | Freq: Once | INTRAVENOUS | Status: AC | PRN
  Administered 2017-05-31: 75 mL via INTRAVENOUS

## 2017-05-31 MED ORDER — FUROSEMIDE 20 MG PO TABS: 20.0000 mg | ORAL_TABLET | Freq: Every day | ORAL | Status: DC | PRN

## 2017-05-31 NOTE — Evaluation (Signed)
Physical Therapy Evaluation Patient Details Name: ETANA BEETS MRN: [id number] DOB: 06-23-1941 Today's Date: 05/31/2017   History of Present Illness  76 year old female with a history of morbid obesity, anemia, asthma, hypertension, lumbar spinal TB in 2002 who presented with acute onset of lower back pain and Leukocytosis  Clinical Impression  Pt admitted with above diagnosis. Pt currently with functional limitations due to the deficits listed below (see PT Problem List).  Pt will benefit from skilled PT to increase their independence and safety with mobility to allow discharge to the venue listed below.  Pt assisted to [facility] and then ambulated short distance in hallway.  Pt reports R LE weakness which is new.  Pt has family assist at home however not likely able to provide current level of care.  Pt agreeable to SNF upon d/c if mobility has not improved during acute stay.     Follow Up Recommendations SNF;Supervision/Assistance - 24 hour    Equipment Recommendations  Rolling walker with 5" wheels    Recommendations for Other Services       Precautions / Restrictions Precautions Precautions: Fall      Mobility  Bed Mobility Overal bed mobility: Needs Assistance Bed Mobility: Supine to Sit     Supine to sit: Min assist;HOB elevated     General bed mobility comments: assist for trunk upright  Transfers Overall transfer level: Needs assistance Equipment used: Rolling walker (2 wheeled) Transfers: Sit to/from [facility] Sit to Stand: Min assist Stand pivot transfers: Min assist       General transfer comment: assist to rise and steady, verbal cues for hand placement, pivoted to [facility], pt unable to perform hygiene as she required bil UE support for balance with standing  Ambulation/Gait Ambulation/Gait assistance: Min guard Ambulation Distance (Feet): 15 Feet Assistive device: Rolling walker (2 wheeled) Gait Pattern/deviations: Step-through  pattern;Decreased stride length     General Gait Details: verbal cues for RW positioning, distance to tolerance, recliner following as pt fatigued quickly  Stairs            Wheelchair Mobility    Modified Rankin (Stroke Patients Only)       Balance Overall balance assessment: Needs assistance (denies any recent falls)         Standing balance support: Bilateral upper extremity supported Standing balance-Leahy Scale: Poor Standing balance comment: requires UE support                             Pertinent Vitals/Pain Pain Assessment: 0-10 Pain Score: 3  Pain Location: back Pain Descriptors / Indicators: Aching Pain Intervention(s): Limited activity within patient's tolerance;Monitored during session;Repositioned;Premedicated before session    Home Living Family/patient expects to be discharged to:: Private residence Living Arrangements: Other relatives;Children Available Help at Discharge: Family;Available 24 hours/day Type of Home: House Home Access: Stairs to enter Entrance Stairs-Rails: Right Entrance Stairs-Number of Steps: 3 Home Layout: One level Home Equipment: Cane - quad      Prior Function Level of Independence: Independent with assistive device(s)         Comments: uses quad cane in community     Hand Dominance        Extremity/Trunk Assessment        Lower Extremity Assessment Lower Extremity Assessment: Generalized weakness;RLE deficits/detail RLE Deficits / Details: overall R LE presents with more weakness then L LE, 3/5 throughout, pt reports this is new however improved since admission  Communication   Communication: No difficulties  Cognition Arousal/Alertness: Awake/alert Behavior During Therapy: WFL for tasks assessed/performed Overall Cognitive Status: Within Functional Limits for tasks assessed                                        General Comments      Exercises      Assessment/Plan    PT Assessment Patient needs continued PT services  PT Problem List Decreased strength;Decreased mobility;Pain;Decreased balance;Decreased knowledge of use of DME;Decreased activity tolerance       PT Treatment Interventions Gait training;DME instruction;Therapeutic activities;Therapeutic exercise;Patient/family education;Functional mobility training;Stair training    PT Goals (Current goals can be found in the Care Plan section)  Acute Rehab PT Goals PT Goal Formulation: With patient Time For Goal Achievement: 06/14/17 Potential to Achieve Goals: Good    Frequency Min 3X/week   Barriers to discharge        Co-evaluation               AM-PAC PT "6 Clicks" Daily Activity  Outcome Measure Difficulty turning over in bed (including adjusting bedclothes, sheets and blankets)?: None Difficulty moving from lying on back to sitting on the side of the bed? : Unable Difficulty sitting down on and standing up from a chair with arms (e.g., wheelchair, bedside commode, etc,.)?: Unable Help needed moving to and from a bed to chair (including a wheelchair)?: A Little Help needed walking in hospital room?: A Little Help needed climbing 3-5 steps with a railing? : A Lot 6 Click Score: 14    End of Session   Activity Tolerance: Patient tolerated treatment well Patient left: in chair;with call bell/phone within reach Nurse Communication: Mobility status (use stedy if back spasms worsen) PT Visit Diagnosis: Difficulty in walking, not elsewhere classified (R26.2)    Time: 6269-4854 PT Time Calculation (min) (ACUTE ONLY): 25 min   Charges:   PT Evaluation $PT Eval Low Complexity: 1 Low     PT G CodesCarmelia Bake, PT, DPT 05/31/2017 Pager: [phone]  York Ram E 05/31/2017, 4:14 PM

## 2017-05-31 NOTE — Care Management Note (Signed)
Case Management Note  Patient Details  Name: Erika [name] MRN: [id number] Date of Birth: 07/03/1941  Subjective/Objective: 76 y/o f admitted w/Leukocytosis. From home. PT cons-await recc.                   Action/Plan:d/c home.   Expected Discharge Date:   (unknown)               Expected Discharge Plan:  Home/Self Care  In-House Referral:     Discharge planning Services  CM Consult  Post Acute Care Choice:    Choice offered to:     DME Arranged:    DME Agency:     HH Arranged:    HH Agency:     Status of Service:  In process, will continue to follow  If discussed at Long Length of Stay Meetings, dates discussed:    Additional Comments:  Dessa Phi, RN 05/31/2017, 10:49 AM

## 2017-05-31 NOTE — Progress Notes (Signed)
PROGRESS NOTE    Erika [name]  LFY:[id number] DOB: [birth year] DOA: 05/29/2017 PCP: [name], MD    Brief Narrative: Erika [name]  is a 76 y.o. female, w hypertension, asthma, anemia, TB in 2002, apparently c/o back pain x2 days. She underwent MRI of the lumbar spine showing Left laminectomy defects at the L4 through S1 levels. 2. Mild degenerative changes at the L3-4, L4-5 and L5-S1 levels, as described above. 3. No focal disc protrusions or extrusions and no evidence of neural compression any level.  She was also found to have leukocytosis of unclear etiology.  Hematology consulted and awaiting recommendations. So far no source of infection found.   Assessment & Plan:   Principal Problem:   Leukocytosis Active Problems:   Anemia   Back pain:  Possibly muscle spasms. Improving with robaxin will add flexiril and k pad. Pain control and physical therapy evaluation.  She hasnt worked with PT yet.    Leukocytosis: Differential show elevated absolute neutrophil count.  Hematology consulted and CT chest with contrast ordered for further evaluation.    Anemia, normocytic.  Unclear etiology.  Anemia panel ordered and reviewed. Normal ferritin and iron.  Hemoglobin stable at 10.    Mild thrombocytopenia: No evidence of bleeding. Resolved.    Hypertension:  Controlled.   Asthma:  No wheezing heard.     DVT prophylaxis: (Lovenox) Code Status: full code.  Family Communication: NONE at bedside.  Disposition Plan: pending eval by hematology and PT.    Consultants:  Oncology hematology.    Procedures: none.    Antimicrobials: none.    Subjective: Persistent back pain on the right, better compared to yesterday.   Objective: Vitals:   05/30/17 2017 05/31/17 0508 05/31/17 0911 05/31/17 1450  BP:  (!) 119/51  (!) 126/59  Pulse:  (!) 105  100  Resp:  18  18  Temp:  98.6 F (37 C)  98.5 F (36.9 C)  TempSrc:  Oral  Oral  SpO2: 94% 95% 96% 98%    Weight:  (!) 138.9 kg (306 lb 3.5 oz)    Height:        Intake/Output Summary (Last 24 hours) at 05/31/17 1503 Last data filed at 05/31/17 1437  Gross per 24 hour  Intake          1278.75 ml  Output             2450 ml  Net         -1171.25 ml   Filed Weights   05/29/17 2215 05/31/17 0508  Weight: (!) 136.2 kg (300 lb 4.3 oz) (!) 138.9 kg (306 lb 3.5 oz)    Examination:  General exam: Appears calm and comfortable , not in distress.  Respiratory system: Clear to auscultation. Respiratory effort normal. No wheezing or rhonchi.  Cardiovascular system: S1 & S2 heard, RRR. No JVD, No pedal edema. Gastrointestinal system: Abdomen is soft non tender non distended bowel sounds good.  Central nervous system: Alert and oriented. Non focal.  Extremities: Symmetric 5 x 5 power.no cyanosis or clubbing.  Skin: No rashes, lesions or ulcers Psychiatry: Judgement and insight appear normal. Mood & affect appropriate.     Data Reviewed: I have personally reviewed following labs and imaging studies  CBC:  Recent Labs Lab 05/29/17 1416 05/30/17 0439 05/31/17 0450  WBC 55.2* 45.1* 47.0*  NEUTROABS 48.8* 39.1*  --   HGB 11.5* 10.5* 10.4*  HCT 34.8* 31.7*  33.1* 31.6*  MCV 92.8 92.7 93.2  PLT 163 143* 196   Basic Metabolic Panel:  Recent Labs Lab 05/29/17 1416 05/30/17 0439 05/31/17 0450  NA 133* 137 138  K 4.1 3.7 3.6  CL 97* 103 104  CO2 25 25 24   GLUCOSE 91 79 84  BUN 19 16 11   CREATININE 1.07* 0.96 0.85  CALCIUM 9.0 8.9 8.7*   GFR: Estimated Creatinine Clearance: 86.1 mL/min (by C-G formula based on SCr of 0.85 mg/dL). Liver Function Tests:  Recent Labs Lab 05/29/17 1416 05/30/17 0439  AST 17 21  ALT 11* 10*  ALKPHOS 161* 182*  BILITOT 0.6 0.5  PROT 8.0 7.3  ALBUMIN 3.6 3.2*    Recent Labs Lab 05/29/17 1416  LIPASE 16   No results for input(s): AMMONIA in the last 168 hours. Coagulation Profile: No results for input(s): INR, PROTIME in the last 168  hours. Cardiac Enzymes: No results for input(s): CKTOTAL, CKMB, CKMBINDEX, TROPONINI in the last 168 hours. BNP (last 3 results) No results for input(s): PROBNP in the last 8760 hours. HbA1C: No results for input(s): HGBA1C in the last 72 hours. CBG: No results for input(s): GLUCAP in the last 168 hours. Lipid Profile: No results for input(s): CHOL, HDL, LDLCALC, TRIG, CHOLHDL, LDLDIRECT in the last 72 hours. Thyroid Function Tests: No results for input(s): TSH, T4TOTAL, FREET4, T3FREE, THYROIDAB in the last 72 hours. Anemia Panel:  Recent Labs  05/30/17 0439  VITAMINB12 722  FERRITIN 159  TIBC 232*  IRON 54   Sepsis Labs:  Recent Labs Lab 05/29/17 1509 05/29/17 1718 05/29/17 2232  PROCALCITON  --   --  0.23  LATICACIDVEN 1.19 0.95  --     Recent Results (from the past 240 hour(s))  Blood culture (routine x 2)     Status: None (Preliminary result)   Collection Time: 05/29/17  3:00 PM  Result Value Ref Range Status   Specimen Description BLOOD RIGHT ANTECUBITAL  Final   Special Requests   Final    BOTTLES DRAWN AEROBIC AND ANAEROBIC Blood Culture adequate volume   Culture   Final    NO GROWTH 2 DAYS Performed at [facility], [place] [address], [place], [place] [address]    Report Status PENDING  Incomplete  Blood culture (routine x 2)     Status: None (Preliminary result)   Collection Time: 05/29/17  3:05 PM  Result Value Ref Range Status   Specimen Description BLOOD LEFT HAND  Final   Special Requests   Final    BOTTLES DRAWN AEROBIC AND ANAEROBIC Blood Culture adequate volume   Culture   Final    NO GROWTH 2 DAYS Performed at [facility], [place] [address], [place], [place] [address]    Report Status PENDING  Incomplete         Radiology Studies: Mr Lumbar Spine W Wo Contrast  Result Date: 05/29/2017 CLINICAL DATA:  Back pain for the past 2 days. Status post L4, L5 and S1 hemilaminectomy and decompression of tumor in the epidural space in  2000. EXAM: MRI LUMBAR SPINE WITHOUT AND WITH CONTRAST TECHNIQUE: Multiplanar and multiecho pulse sequences of the lumbar spine were obtained without and with intravenous contrast. CONTRAST:  36mL MULTIHANCE GADOBENATE DIMEGLUMINE 529 MG/ML IV SOLN COMPARISON:  Abdomen pelvis CT obtained today. FINDINGS: Segmentation: 5 non-rib-bearing lumbar vertebrae with the last open disc space at the L5-S1 level. Alignment:  Normal. Vertebrae: Left-sided laminectomy defects extending from the L4 to the S1 level. No fractures or marrow edema. Conus  medullaris: Extends to the mid L2 level and appears normal. Paraspinal and other soft tissues: Unremarkable. Diffuse disc degeneration throughout the lumbar and lower thoracic spine. Disc levels: L1-2: Normal. L2-3: Normal. L3-4: Mild diffuse posterior disc protrusion. Bilateral ligamentum flavum hypertrophy. These changes are producing mild canal stenosis without significant foraminal stenosis. L4-5: Mild to moderate diffuse posterior disc protrusion. Moderate ligamentum flavum hypertrophy on the right. These changes are producing mild-to-moderate foraminal stenosis on the right. The disc bulging is causing mild foraminal stenosis on the left. There is also mild canal stenosis. L5-S1: Minimal diffuse posterior disc protrusion. Mild ligamentum flavum hypertrophy on the right. Mild to moderate facet hypertrophy on the right. These changes are producing mild foraminal stenosis on the right and minimal foraminal stenosis on the left. No significant canal stenosis. IMPRESSION: 1. Left laminectomy defects at the L4 through S1 levels. 2. Mild degenerative changes at the L3-4, L4-5 and L5-S1 levels, as described above. 3. No focal disc protrusions or extrusions and no evidence of neural compression any level. 4. No evidence of tumor recurrence. Electronically Signed   By: Claudie Revering M.D.   On: 05/29/2017 21:46   Ct Abdomen Pelvis W Contrast  Result Date: 05/29/2017 CLINICAL DATA:  Low  back and abdominal pain for 2 weeks. EXAM: CT ABDOMEN AND PELVIS WITH CONTRAST TECHNIQUE: Multidetector CT imaging of the abdomen and pelvis was performed using the standard protocol following bolus administration of intravenous contrast. CONTRAST:  133mL ISOVUE-300 IOPAMIDOL (ISOVUE-300) INJECTION 61% COMPARISON:  None. FINDINGS: Lower Chest: No acute findings. Hepatobiliary: No hepatic masses identified. Prior cholecystectomy. Stable mild biliary ductal dilatation, likely related to prior cholecystectomy. Pancreas:  No mass or inflammatory changes. Spleen: Within normal limits in size and appearance. Adrenals/Urinary Tract: No masses identified. No evidence of hydronephrosis. Stomach/Bowel: No evidence of obstruction, inflammatory process or abnormal fluid collections. Vascular/Lymphatic: No pathologically enlarged lymph nodes. No abdominal aortic aneurysm. Aortic atherosclerosis. Reproductive: 12 mm calcified fibroid in left uterine fundus. Adnexal regions are unremarkable. Other: Small right paraumbilical ventral hernia containing only fat. No evidence of herniated bowel loops. Musculoskeletal:  No suspicious bone lesions identified. IMPRESSION: No acute findings within the abdomen or pelvis. Small calcified uterine fibroid.  That Small right paraumbilical ventral hernia containing only fat. Electronically Signed   By: Earle Gell M.D.   On: 05/29/2017 16:58   Dg Chest Portable 1 View  Result Date: 05/29/2017 CLINICAL DATA:  C/o pain RLQ x 2 days, no chest pain or sob, no cough or congestion, hx. HTN, non smoker EXAM: PORTABLE CHEST 1 VIEW COMPARISON:  Chest radiograph 02/03/2012 FINDINGS: Stable large cardiac silhouette. There is central venous congestion. No effusion, infiltrate or pneumothorax. IMPRESSION: Cardiomegaly central venous congestion.  No clear acute findings. Electronically Signed   By: Suzy Bouchard M.D.   On: 05/29/2017 15:31        Scheduled Meds: . albuterol  2.5 mg  Nebulization BID  . amLODipine  10 mg Oral Daily  . aspirin EC  81 mg Oral Daily  . enoxaparin (LOVENOX) injection  60 mg Subcutaneous QHS  . iopamidol      . lisinopril  5 mg Oral Daily  . mometasone-formoterol  2 puff Inhalation BID  .  morphine injection  4 mg Intravenous Once  . pantoprazole  40 mg Oral Daily  . potassium chloride SA  10 mEq Oral Daily  . pravastatin  20 mg Oral Daily   Continuous Infusions: . methocarbamol (ROBAXIN)  IV 500 mg (05/31/17 1335)  LOS: 2 days    Time spent: 35 minutes.     Hosie Poisson, MD [facility] Pager [phone] If 7PM-7AM, please contact night-coverage [email or web address] Password [email or web address] 05/31/2017, 3:03 PM

## 2017-05-31 NOTE — Progress Notes (Signed)
PT Cancellation Note  Patient Details Name: Erika [name] MRN: [id number] DOB: June 28, 1941   Cancelled Treatment:    Reason Eval/Treat Not Completed: Patient at procedure or test/unavailable (pt to go for imaging shortly, will check back as schedule permits.)   Magdalynn Davilla,KATHrine E 05/31/2017, 3:16 PM Carmelia Bake, PT, DPT 05/31/2017 Pager: [phone]

## 2017-06-01 ENCOUNTER — Encounter (HOSPITAL_COMMUNITY): Payer: Self-pay

## 2017-06-01 LAB — BASIC METABOLIC PANEL
Anion gap: 9 (ref 5–15)
BUN: 8 mg/dL (ref 6–20)
CHLORIDE: 104 mmol/L (ref 101–111)
CO2: 28 mmol/L (ref 22–32)
Calcium: 9.1 mg/dL (ref 8.9–10.3)
Creatinine, Ser: 0.8 mg/dL (ref 0.44–1.00)
GFR calc Af Amer: >60 mL/min (ref >60)
GFR calc non Af Amer: >60 mL/min (ref >60)
GLUCOSE: 92 mg/dL (ref 65–99)
POTASSIUM: 3.8 mmol/L (ref 3.5–5.1)
Sodium: 141 mmol/L (ref 135–145)

## 2017-06-01 LAB — CBC
HCT: 30.8 % — ABNORMAL LOW (ref 36.0–46.0)
HEMOGLOBIN: 10.1 g/dL — AB (ref 12.0–15.0)
MCH: 30.4 pg (ref 26.0–34.0)
MCHC: 32.8 g/dL (ref 30.0–36.0)
MCV: 92.8 fL (ref 78.0–100.0)
Platelets: 185 10*3/uL (ref 150–400)
RBC: 3.32 MIL/uL — AB (ref 3.87–5.11)
RDW: 15.1 % (ref 11.5–15.5)
WBC: 54 10*3/uL (ref 4.0–10.5)

## 2017-06-01 MED ORDER — LORAZEPAM 1 MG PO TABS
1.0000 mg | ORAL_TABLET | Freq: Once | ORAL | Status: AC
  Administered 2017-06-01: 1 mg via ORAL
  Filled 2017-06-01: qty 1

## 2017-06-01 NOTE — Progress Notes (Signed)
CRITICAL VALUE ALERT  Critical Value:  WBC 54  Date & Time Notied:  06/01/17 0605  Provider Notified: Kennon Holter  Orders Received/Actions taken: No new orders placed

## 2017-06-01 NOTE — Progress Notes (Signed)
Dr. Karleen Hampshire via phone updated regarding EKG and current heart rate of 135-140 BP rechecked 146/82. MD to place new orders. Maintain current plan of care and monitor Pt closely

## 2017-06-01 NOTE — NC FL2 (Signed)
[place] LEVEL OF CARE SCREENING TOOL     IDENTIFICATION  Patient Name: Erika [name] Birthdate: 1941/01/09 Sex: female Admission Date (Current Location): 05/29/2017  [facility] and [place] Number:  Herbalist and Address:  [facility],  [place] [address], [place]      Provider Number: [id number]  Attending Physician Name and Address:  Hosie Poisson, MD  Relative Name and Phone Number:       Current Level of Care: Hospital Recommended Level of Care: [place] Prior Approval Number:    Date Approved/Denied:   PASRR Number: [id number]  Discharge Plan: SNF    Current Diagnoses: Patient Active Problem List   Diagnosis Date Noted  . Leukocytosis 05/29/2017  . Anemia 05/29/2017  . Leg swelling 02/23/2016  . Hand cramps 11/22/2015  . GERD (gastroesophageal reflux disease) 02/09/2013  . OA (osteoarthritis) of knee 01/29/2012  . OA (osteoarthritis of spine) 10/18/2011  . PAC (premature atrial contraction) 05/16/2011  . Healthcare maintenance 04/25/2011  . Hypokalemia 02/26/2008  . Moderate persistent asthma 02/10/2008  . Hyperlipidemia 08/04/2007  . H/O Herpes zoster 08/29/2006  . Obesity, Class III, BMI 40-49.9 (morbid obesity) ([place]) 08/29/2006  . History of breast cyst 08/29/2006  . Essential hypertension 08/27/2006  . 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    Orientation RESPIRATION BLADDER Height & Weight     Self, Time, Situation, Place  Normal Incontinent Weight: 297 lb 11.2 oz (135 kg) Height:  5\' 8"  (172.7 cm)  BEHAVIORAL SYMPTOMS/MOOD NEUROLOGICAL BOWEL NUTRITION STATUS        Diet (heart healthy/carb modified )  AMBULATORY STATUS COMMUNICATION OF NEEDS Skin   Limited Assist Verbally Normal                       Personal Care Assistance Level of Assistance  Bathing, Feeding, Dressing Bathing  Assistance: Limited assistance Feeding assistance: Independent Dressing Assistance: Limited assistance     Functional Limitations Info  Sight, Hearing, Speech Sight Info: Adequate Hearing Info: Adequate Speech Info: Adequate    SPECIAL CARE FACTORS FREQUENCY  PT (By licensed PT), OT (By licensed OT)     PT Frequency: 5x OT Frequency: 5x            Contractures Contractures Info: Not present    Additional Factors Info  Code Status, Allergies Code Status Info: Full Code Allergies Info: Prednisone            Current Medications (06/01/2017):  This is the current hospital active medication list Current Facility-Administered Medications  Medication Dose Route Frequency Provider Last Rate Last Dose  . acetaminophen (TYLENOL) tablet 650 mg  650 mg Oral Q6H PRN Jani Gravel, MD       Or  . acetaminophen (TYLENOL) suppository 650 mg  650 mg Rectal Q6H PRN Jani Gravel, MD      . albuterol (PROVENTIL) (2.5 MG/3ML) 0.083% nebulizer solution 2.5 mg  2.5 mg Nebulization Q6H PRN Jani Gravel, MD   2.5 mg at 05/30/17 2017  . albuterol (PROVENTIL) (2.5 MG/3ML) 0.083% nebulizer solution 2.5 mg  2.5 mg Nebulization BID Hosie Poisson, MD   2.5 mg at 06/01/17 0739  . amLODipine (NORVASC) tablet 10 mg  10 mg Oral Daily Jani Gravel, MD   10 mg at 06/01/17 1003  . aspirin EC tablet 81 mg  81 mg Oral Daily Jani Gravel, MD   [phone]  mg at 06/01/17 1003  . enoxaparin (LOVENOX) injection 60 mg  60 mg Subcutaneous QHS Jani Gravel, MD   60 mg at 05/31/17 2123  . furosemide (LASIX) tablet 20 mg  20 mg Oral Daily PRN Hosie Poisson, MD      . lisinopril (PRINIVIL,ZESTRIL) tablet 5 mg  5 mg Oral Daily Jani Gravel, MD   5 mg at 06/01/17 1003  . methocarbamol (ROBAXIN) 500 mg in dextrose 5 % 50 mL IVPB  500 mg Intravenous Q6H PRN Hosie Poisson, MD 110 mL/hr at 05/31/17 1335 500 mg at 05/31/17 1335  . mometasone-formoterol (DULERA) 200-5 MCG/ACT inhaler 2 puff  2 puff Inhalation BID Jani Gravel, MD   2 puff at 06/01/17  0739  . morphine 4 MG/ML injection 4 mg  4 mg Intravenous Once Glyn Ade, PA-C   Stopped at 05/29/17 1619  . pantoprazole (PROTONIX) EC tablet 40 mg  40 mg Oral Daily Jani Gravel, MD   40 mg at 06/01/17 1003  . potassium chloride (K-DUR,KLOR-CON) CR tablet 10 mEq  10 mEq Oral Daily Jani Gravel, MD   10 mEq at 06/01/17 1003  . pravastatin (PRAVACHOL) tablet 20 mg  20 mg Oral Daily Jani Gravel, MD   20 mg at 06/01/17 1003  . traMADol (ULTRAM) tablet 50 mg  50 mg Oral Q12H PRN Jani Gravel, MD   50 mg at 05/31/17 6283     Discharge Medications: Please see discharge summary for a list of discharge medications.  Relevant Imaging Results:  Relevant Lab Results:   Additional Information SSN  [id number]  Burnis Medin, LCSW

## 2017-06-01 NOTE — Progress Notes (Signed)
Pt's vital signs checked and heart rate noted to be 130-140s. BP 144/88.  Placed on tele. Monitor and EKG being done. MD updated via phone. Pt's assessment is otherwise without acute changes and Pt denies any complaints of chest pain shortness of breath or any other problems.Pt and informed and all questions answered.

## 2017-06-01 NOTE — Progress Notes (Signed)
PROGRESS NOTE    Erika [name]  NFA:[id number] DOB: January 05, 1941 DOA: 05/29/2017 PCP: [name], MD    Brief Narrative: Erika [name]  is a 76 y.o. female, w hypertension, asthma, anemia, TB in 2002, apparently c/o back pain x2 days. She underwent MRI of the lumbar spine showing Left laminectomy defects at the L4 through S1 levels. 2. Mild degenerative changes at the L3-4, L4-5 and L5-S1 levels, as described above. 3. No focal disc protrusions or extrusions and no evidence of neural compression any level.  She was also found to have leukocytosis of unclear etiology.  Hematology consulted and awaiting recommendations. So far no source of infection found.   Assessment & Plan:   Principal Problem:   Leukocytosis Active Problems:   Anemia   Back pain:  Possibly muscle spasms. Improving with robaxin, flexiril, K PAD. Pain control and physical therapy evaluation.  PT recommended SNF. Pt refusing it.    Leukocytosis: Differential show elevated absolute neutrophil count.  Hematology consulted and CT chest with contrast ordered for further evaluation.  CT chest shows mild pulm edema.  Myeloma panel shows hyperglobulinemia of unclear significance.     Anemia, normocytic.  Unclear etiology.  Anemia panel ordered and reviewed. Normal ferritin and iron.  Hemoglobin stable at 10.    Mild thrombocytopenia: No evidence of bleeding. Resolved.    Hypertension:  Controlled.   Asthma:  No wheezing heard.     DVT prophylaxis: (Lovenox) Code Status: full code.  Family Communication: NONE at bedside. Spoke to son over the phone for 45 minutes.  Disposition Plan: pending eval by hematology and PT.    Consultants:  Oncology hematology.    Procedures: none.    Antimicrobials: none.    Subjective: Back pain is better.   Objective: Vitals:   06/01/17 0501 06/01/17 0740 06/01/17 1003 06/01/17 1357  BP: (!) 134/56  (!) 132/58 (!) 144/88  Pulse: (!) 103   (!)  139  Resp: 18   18  Temp: 98.6 F (37 C)   98.3 F (36.8 C)  TempSrc: Oral   Oral  SpO2: 93% 94%  94%  Weight: 135 kg (297 lb 11.2 oz)     Height:        Intake/Output Summary (Last 24 hours) at 06/01/17 1608 Last data filed at 06/01/17 0600  Gross per 24 hour  Intake              540 ml  Output                0 ml  Net              540 ml   Filed Weights   05/29/17 2215 05/31/17 0508 06/01/17 0501  Weight: (!) 136.2 kg (300 lb 4.3 oz) (!) 138.9 kg (306 lb 3.5 oz) 135 kg (297 lb 11.2 oz)    Examination: no change.   General exam: Appears calm and comfortable , not in distress.  Respiratory system: Clear to auscultation. Respiratory effort normal. No wheezing or rhonchi.  Cardiovascular system: S1 & S2 heard, RRR. No JVD, No pedal edema. Gastrointestinal system: Abdomen is soft non tender non distended bowel sounds good.  Central nervous system: Alert and oriented. Non focal.  Extremities: Symmetric 5 x 5 power.no cyanosis or clubbing.  Skin: No rashes, lesions or ulcers Psychiatry: Judgement and insight appear normal. Mood & affect appropriate.     Data Reviewed: I have personally reviewed following labs and imaging studies  CBC:  Recent Labs Lab 05/29/17 1416 05/30/17 0439 05/31/17 0450 06/01/17 0509  WBC 55.2* 45.1* 47.0* 54.0*  NEUTROABS 48.8* 39.1*  --   --   HGB 11.5* 10.5* 10.4* 10.1*  HCT 34.8* 31.7*  33.1* 31.6* 30.8*  MCV 92.8 92.7 93.2 92.8  PLT 163 143* 165 782   Basic Metabolic Panel:  Recent Labs Lab 05/29/17 1416 05/30/17 0439 05/31/17 0450 06/01/17 0509  NA 133* 137 138 141  K 4.1 3.7 3.6 3.8  CL 97* 103 104 104  CO2 25 25 24 28   GLUCOSE 91 79 84 92  BUN 19 16 11 8   CREATININE 1.07* 0.96 0.85 0.80  CALCIUM 9.0 8.9 8.7* 9.1   GFR: Estimated Creatinine Clearance: 89.9 mL/min (by C-G formula based on SCr of 0.8 mg/dL). Liver Function Tests:  Recent Labs Lab 05/29/17 1416 05/30/17 0439  AST 17 21  ALT 11* 10*  ALKPHOS 161*  182*  BILITOT 0.6 0.5  PROT 8.0 7.3  ALBUMIN 3.6 3.2*    Recent Labs Lab 05/29/17 1416  LIPASE 16   No results for input(s): AMMONIA in the last 168 hours. Coagulation Profile: No results for input(s): INR, PROTIME in the last 168 hours. Cardiac Enzymes: No results for input(s): CKTOTAL, CKMB, CKMBINDEX, TROPONINI in the last 168 hours. BNP (last 3 results) No results for input(s): PROBNP in the last 8760 hours. HbA1C: No results for input(s): HGBA1C in the last 72 hours. CBG: No results for input(s): GLUCAP in the last 168 hours. Lipid Profile: No results for input(s): CHOL, HDL, LDLCALC, TRIG, CHOLHDL, LDLDIRECT in the last 72 hours. Thyroid Function Tests: No results for input(s): TSH, T4TOTAL, FREET4, T3FREE, THYROIDAB in the last 72 hours. Anemia Panel:  Recent Labs  05/30/17 0439  VITAMINB12 722  FERRITIN 159  TIBC 232*  IRON 54   Sepsis Labs:  Recent Labs Lab 05/29/17 1509 05/29/17 1718 05/29/17 2232  PROCALCITON  --   --  0.23  LATICACIDVEN 1.19 0.95  --     Recent Results (from the past 240 hour(s))  Blood culture (routine x 2)     Status: None (Preliminary result)   Collection Time: 05/29/17  3:00 PM  Result Value Ref Range Status   Specimen Description BLOOD RIGHT ANTECUBITAL  Final   Special Requests   Final    BOTTLES DRAWN AEROBIC AND ANAEROBIC Blood Culture adequate volume   Culture   Final    NO GROWTH 3 DAYS Performed at [facility], [place] [address] [place], [place] [address]    Report Status PENDING  Incomplete  Blood culture (routine x 2)     Status: None (Preliminary result)   Collection Time: 05/29/17  3:05 PM  Result Value Ref Range Status   Specimen Description BLOOD LEFT HAND  Final   Special Requests   Final    BOTTLES DRAWN AEROBIC AND ANAEROBIC Blood Culture adequate volume   Culture   Final    NO GROWTH 3 DAYS Performed at [facility], [place] [address], [place], [place] [address]    Report Status PENDING   Incomplete         Radiology Studies: Ct Chest W Contrast  Result Date: 05/31/2017 CLINICAL DATA:  Leukocytosis.  Back pain.  Muscle spasms. EXAM: CT CHEST WITH CONTRAST TECHNIQUE: Multidetector CT imaging of the chest was performed during intravenous contrast administration. CONTRAST:  50mL ISOVUE-300 IOPAMIDOL (ISOVUE-300) INJECTION 61% COMPARISON:  05/29/2017 FINDINGS: Cardiovascular: No significant vascular findings. Normal heart size. No  pericardial effusion. Mediastinum/Nodes: No axillary supraclavicular adenopathy. No mediastinal hilar adenopathy. Subcarinal lymph node is enlarged measuring 22 mm short access Lungs/Pleura: Small bilateral effusions. No suspicious pulmonary nodules. Diffuse ground-glass opacities suggesting pulmonary edema. Upper Abdomen: Limited view of the liver, kidneys, pancreas are unremarkable. Normal adrenal glands. Musculoskeletal: No aggressive osseous lesion IMPRESSION: 1. Mild ground-glass opacities suggest mild pulmonary edema. 2. Small bilateral pleural effusions and basilar atelectasis. 3. No pulmonary infiltrate. 4. Mild subcarinal mediastinal adenopathy is likely reactive. 5. Cardiomegaly. Electronically Signed   By: Suzy Bouchard M.D.   On: 05/31/2017 15:43        Scheduled Meds: . albuterol  2.5 mg Nebulization BID  . amLODipine  10 mg Oral Daily  . aspirin EC  81 mg Oral Daily  . enoxaparin (LOVENOX) injection  60 mg Subcutaneous QHS  . lisinopril  5 mg Oral Daily  . mometasone-formoterol  2 puff Inhalation BID  .  morphine injection  4 mg Intravenous Once  . pantoprazole  40 mg Oral Daily  . potassium chloride SA  10 mEq Oral Daily  . pravastatin  20 mg Oral Daily   Continuous Infusions: . methocarbamol (ROBAXIN)  IV 500 mg (05/31/17 1335)     LOS: 3 days    Time spent: 35 minutes.     Hosie Poisson, MD [facility] Pager [phone] If 7PM-7AM, please contact night-coverage [email or web address] Password [email or web address] 06/01/2017, 4:08  PM

## 2017-06-01 NOTE — Progress Notes (Signed)
CSW followed up with patient about discharge plans, patient's son at bedside. Patient agreeable to SNF. CSW provided bed offers.   CSW contacted [employer] and started insurance authorization process, patient will need to select facility so that process can be completed.  CSW will continue to follow and assist discharge planning.  [name], [place] Social Worker [facility] Cell#: [phone]

## 2017-06-01 NOTE — Clinical Social Work Note (Signed)
Clinical Social Work Assessment  Patient Details  Name: Erika [name] MRN: [id number] Date of Birth: [birth year]  Date of referral:  06/01/17               Reason for consult:  Facility Placement                Permission sought to share information with:  Chartered certified accountant granted to share information::  Yes, Verbal Permission Granted  Name::        Agency::     Relationship::     Contact Information:     Housing/Transportation Living arrangements for the past 2 months:  Single Family Home Source of Information:  Patient Patient Interpreter Needed:  None Criminal Activity/Legal Involvement Pertinent to Current Situation/Hospitalization:  No - Comment as needed Significant Relationships:  Adult Children Lives with:  Adult Children, Relatives Do you feel safe going back to the place where you live?  Yes (PT recommending SNF) Need for family participation in patient care:  No (Coment)  Care giving concerns:  Patient from home and resides with son and granddaughter. Patient reported that prior to hospitalization she was independent with ADLs, noting she had to sit down often. Patient reported that her son and granddaughter should be able to help her in the home. PT recommending SNF for ST rehab. Patient reported that she was open to considering SNF but was not sure if she was willing to go.   Social Worker assessment / plan:  CSW spoke with patient at bedside regarding PT recommendation for SNF. Patient reported that she has not been to SNF in the past and has been independent with all ADLs. Patient reported that she was open to receive offers but not sure if she wanted to go to SNF at discharge. Patient inquired about home health PT, CSW explained the difference between HHPT and SNF for rehab. Patient reported that she would think about SNF for rehab. CSW explained to patient that her insurance requires prior authorization and that CSW would need to know to start  that process, patient verbalized understanding.  CSW completed FL2 and will provide bed offers.   CSW will follow up with patient to see if she wants to pursue SNF, if patient agreeable CSW will start insurance authorization process.  Employment status:  Retired Nurse, adult PT Recommendations:  [place] / Referral to community resources:  [place]  Patient/Family's Response to care:  Patient thanked CSW for assistance with discharge planning.   Patient/Family's Understanding of and Emotional Response to Diagnosis, Current Treatment, and Prognosis:  Patient presented pleasant and verbalized some understanding of current treatment. Patient verbalized that she is unsure regarding discharge plans, patient appeared apprehensive about SNF for rehab. CSW will follow up to assess further about plans for discharge and patient's feelings surrounding discharge plans.   Emotional Assessment Appearance:  Appears stated age Attitude/Demeanor/Rapport:  Other (Open) Affect (typically observed):  Calm, Pleasant Orientation:  Oriented to Self, Oriented to Place, Oriented to  Time, Oriented to Situation Alcohol / Substance use:  Not Applicable Psych involvement (Current and /or in the community):  No (Comment)  Discharge Needs  Concerns to be addressed:  Discharge Planning Concerns Readmission within the last 30 days:  No Current discharge risk:  Physical Impairment Barriers to Discharge:  Continued Medical Work up   [employer], LCSW 06/01/2017, 11:30 AM

## 2017-06-02 ENCOUNTER — Encounter (HOSPITAL_COMMUNITY): Payer: Self-pay

## 2017-06-02 ENCOUNTER — Inpatient Hospital Stay (HOSPITAL_COMMUNITY): Payer: PPO

## 2017-06-02 DIAGNOSIS — I480 Paroxysmal atrial fibrillation: Secondary | ICD-10-CM | POA: Diagnosis present

## 2017-06-02 DIAGNOSIS — I10 Essential (primary) hypertension: Secondary | ICD-10-CM

## 2017-06-02 DIAGNOSIS — K219 Gastro-esophageal reflux disease without esophagitis: Secondary | ICD-10-CM

## 2017-06-02 LAB — BASIC METABOLIC PANEL
Anion gap: 10 (ref 5–15)
BUN: 12 mg/dL (ref 6–20)
CALCIUM: 9 mg/dL (ref 8.9–10.3)
CHLORIDE: 101 mmol/L (ref 101–111)
CO2: 26 mmol/L (ref 22–32)
CREATININE: 0.9 mg/dL (ref 0.44–1.00)
GFR calc non Af Amer: >60 mL/min (ref >60)
GLUCOSE: 97 mg/dL (ref 65–99)
Potassium: 4 mmol/L (ref 3.5–5.1)
Sodium: 137 mmol/L (ref 135–145)

## 2017-06-02 LAB — ECHOCARDIOGRAM COMPLETE
HEIGHTINCHES: 68 in
WEIGHTICAEL: 4763.2 [oz_av]

## 2017-06-02 MED ORDER — FUROSEMIDE 20 MG PO TABS
20.0000 mg | ORAL_TABLET | Freq: Once | ORAL | Status: AC
  Administered 2017-06-02: 20 mg via ORAL
  Filled 2017-06-02: qty 1

## 2017-06-02 MED ORDER — METOPROLOL TARTRATE 25 MG PO TABS
12.5000 mg | ORAL_TABLET | Freq: Two times a day (BID) | ORAL | Status: DC
  Administered 2017-06-02 – 2017-06-03 (×2): 12.5 mg via ORAL
  Filled 2017-06-02 (×2): qty 1

## 2017-06-02 MED ORDER — METOPROLOL TARTRATE 5 MG/5ML IV SOLN
5.0000 mg | Freq: Once | INTRAVENOUS | Status: AC
  Administered 2017-06-02: 5 mg via INTRAVENOUS
  Filled 2017-06-02: qty 5

## 2017-06-02 NOTE — Progress Notes (Signed)
  Echocardiogram 2D Echocardiogram has been performed.  Erika [name] M 06/02/2017, 1:38 PM

## 2017-06-02 NOTE — Plan of Care (Signed)
Problem: Safety: Goal: Ability to remain free from injury will improve Outcome: Completed/Met Date Met: 06/02/17 BED ALARM

## 2017-06-02 NOTE — Progress Notes (Signed)
RN contacted by CMT due to increased HR sustaining 128-130's. Pt has no complaints at this time VS 128/ 66, 128, 18, 97%ra. EKG completed.  Will continue to monitor and update provider

## 2017-06-02 NOTE — Social Work (Signed)
CSW met with patient at bedside with Dr. Akula. CSW discussed selecting SNF bed. Pt indicated that her sons were at work and she will try and follow up. Pt indicated that she did not want to select SNF without discussing with sons first and she also indicated her ambivalence about going to SNF. Pt indicated that prior to hospitalization she was independent with ADL's and babysitting.  12:22pm son Chris returned all to CSW. CSW discussed SNF placement and patient's desires to return home. Son indicated he will f/u with patient and call CSW back.  CSW will continue to follow.  CSW did put RNCM on notice as patient desires possible return home.   , LCSW Clinical Social Worker [phone]   

## 2017-06-02 NOTE — Social Work (Addendum)
CSW received a call back from son, Gerald Stabs, indicating that he would like patient to go to [facility]. CSW encouraged son to have a back up SNF just in case [facility] not have any available beds. CSW reitereated the need to obtain insurance auth as well.  CSW contacted SNF to see if they can offer a bed for patient. CSW will f/u.  3:00pm: admissions staff confirmed bed offer from [facility].  Elissa Hefty, [place] Social Worker-Weekend [phone]

## 2017-06-02 NOTE — Progress Notes (Signed)
PROGRESS NOTE    Erika [name]  XLK:[id number] DOB: 1941-02-10 DOA: 05/29/2017 PCP: [name], MD    Brief Narrative: Erika [name]  is a 76 y.o. female, w hypertension, asthma, anemia, TB in 2002, apparently c/o back pain x2 days. She underwent MRI of the lumbar spine showing Left laminectomy defects at the L4 through S1 levels. 2. Mild degenerative changes at the L3-4, L4-5 and L5-S1 levels, as described above. 3. No focal disc protrusions or extrusions and no evidence of neural compression any level.  She was also found to have leukocytosis of unclear etiology.  Hematology consulted and awaiting recommendations. So far no source of infection found.   Assessment & Plan:   Principal Problem:   Leukocytosis Active Problems:   Hyperlipidemia   Obesity, Class III, BMI 40-49.9 (morbid obesity) (HCC)   Essential hypertension   Moderate persistent asthma   OA (osteoarthritis of spine)   OA (osteoarthritis) of knee   GERD (gastroesophageal reflux disease)   Anemia   AF (paroxysmal atrial fibrillation) (HCC)   Back pain:  Possibly muscle spasms. Improving with robaxin, flexiril, K PAD. Pain control and physical therapy evaluation.  PT recommended SNF. Patient and family thinking about the SNF.    Leukocytosis: Differential show elevated absolute neutrophil count.  Hematology consulted and CT chest with contrast ordered for further evaluation.  CT chest shows mild pulm edema.  Myeloma panel shows hyperglobulinemia of unclear significance.  FURTHER work up of the leukocytosis as per hematology as outpatient.     Anemia, normocytic.  Unclear etiology.  Anemia panel ordered and reviewed. Normal ferritin and iron.  Hemoglobin stable at 10.    Mild thrombocytopenia: No evidence of bleeding. Resolved.    Hypertension:  Controlled.   Asthma:  No wheezing heard.    Morbid obesity:  Weight loss and life style modification.    Paroxysmal atrial fibrillation:   Converted to sinus with IV metoprolol.  Echo ordered. Reviewed.  She remains asymptomatic.  Started her on po metoprolol 12.5 mg bid and currently she is in sinus.     GERD: Stable.     DVT prophylaxis: (Lovenox) Code Status: full code.  Family Communication: NONE at bedside.  Disposition Plan: home vs SNF in am.    Consultants:  Oncology hematology.    Procedures: none.    Antimicrobials: none.    Subjective: Back pain is better.  Able to work with PT.   Objective: Vitals:   06/02/17 0613 06/02/17 0804 06/02/17 1031 06/02/17 1300  BP: 139/60  124/66 128/80  Pulse: 95  (!) 124 (!) 130  Resp: 20  18 18   Temp: 97.9 F (36.6 C)   98.2 F (36.8 C)  TempSrc: Oral   Oral  SpO2: 96% 91% 96%   Weight:      Height:        Intake/Output Summary (Last 24 hours) at 06/02/17 1526 Last data filed at 06/02/17 0200  Gross per 24 hour  Intake                0 ml  Output                0 ml  Net                0 ml   Filed Weights   05/29/17 2215 05/31/17 0508 06/01/17 0501  Weight: (!) 136.2 kg (300 lb 4.3 oz) (!) 138.9 kg (306 lb 3.5 oz) 135 kg (297 lb 11.2 oz)  Examination:   General exam: COMFORTABLE.  Respiratory system: clear, no wheezing or rhonchi.  Cardiovascular system: S1 & S2 heard, tachycardic and irregular.  No JVD, No pedal edema. Gastrointestinal system: Abdomen is soft NT ND BS+ Central nervous system: Alert and oriented. NO focal deficits.  Extremities: Symmetric 5 x 5 power.no cyanosis or clubbing.  Skin: No rashes, lesions or ulcers Psychiatry:  Mood & affect appropriate.     Data Reviewed: I have personally reviewed following labs and imaging studies  CBC:  Recent Labs Lab 05/29/17 1416 05/30/17 0439 05/31/17 0450 06/01/17 0509  WBC 55.2* 45.1* 47.0* 54.0*  NEUTROABS 48.8* 39.1*  --   --   HGB 11.5* 10.5* 10.4* 10.1*  HCT 34.8* 31.7*  33.1* 31.6* 30.8*  MCV 92.8 92.7 93.2 92.8  PLT 163 143* 165 643   Basic Metabolic  Panel:  Recent Labs Lab 05/29/17 1416 05/30/17 0439 05/31/17 0450 06/01/17 0509 06/02/17 1202  NA 133* 137 138 141 137  K 4.1 3.7 3.6 3.8 4.0  CL 97* 103 104 104 101  CO2 25 25 24 28 26   GLUCOSE 91 79 84 92 97  BUN 19 16 11 8 12   CREATININE 1.07* 0.96 0.85 0.80 0.90  CALCIUM 9.0 8.9 8.7* 9.1 9.0   GFR: Estimated Creatinine Clearance: 79.9 mL/min (by C-G formula based on SCr of 0.9 mg/dL). Liver Function Tests:  Recent Labs Lab 05/29/17 1416 05/30/17 0439  AST 17 21  ALT 11* 10*  ALKPHOS 161* 182*  BILITOT 0.6 0.5  PROT 8.0 7.3  ALBUMIN 3.6 3.2*    Recent Labs Lab 05/29/17 1416  LIPASE 16   No results for input(s): AMMONIA in the last 168 hours. Coagulation Profile: No results for input(s): INR, PROTIME in the last 168 hours. Cardiac Enzymes: No results for input(s): CKTOTAL, CKMB, CKMBINDEX, TROPONINI in the last 168 hours. BNP (last 3 results) No results for input(s): PROBNP in the last 8760 hours. HbA1C: No results for input(s): HGBA1C in the last 72 hours. CBG: No results for input(s): GLUCAP in the last 168 hours. Lipid Profile: No results for input(s): CHOL, HDL, LDLCALC, TRIG, CHOLHDL, LDLDIRECT in the last 72 hours. Thyroid Function Tests: No results for input(s): TSH, T4TOTAL, FREET4, T3FREE, THYROIDAB in the last 72 hours. Anemia Panel: No results for input(s): VITAMINB12, FOLATE, FERRITIN, TIBC, IRON, RETICCTPCT in the last 72 hours. Sepsis Labs:  Recent Labs Lab 05/29/17 1509 05/29/17 1718 05/29/17 2232  PROCALCITON  --   --  0.23  LATICACIDVEN 1.19 0.95  --     Recent Results (from the past 240 hour(s))  Blood culture (routine x 2)     Status: None (Preliminary result)   Collection Time: 05/29/17  3:00 PM  Result Value Ref Range Status   Specimen Description BLOOD RIGHT ANTECUBITAL  Final   Special Requests   Final    BOTTLES DRAWN AEROBIC AND ANAEROBIC Blood Culture adequate volume   Culture   Final    NO GROWTH 3 DAYS Performed  at [facility], [address], [place], [place] [address]    Report Status PENDING  Incomplete  Blood culture (routine x 2)     Status: None (Preliminary result)   Collection Time: 05/29/17  3:05 PM  Result Value Ref Range Status   Specimen Description BLOOD LEFT HAND  Final   Special Requests   Final    BOTTLES DRAWN AEROBIC AND ANAEROBIC Blood Culture adequate volume   Culture   Final  NO GROWTH 3 DAYS Performed at [facility], [place] [address] [place], [place] [address]    Report Status PENDING  Incomplete         Radiology Studies: No results found.      Scheduled Meds: . albuterol  2.5 mg Nebulization BID  . amLODipine  10 mg Oral Daily  . aspirin EC  81 mg Oral Daily  . enoxaparin (LOVENOX) injection  60 mg Subcutaneous QHS  . furosemide  20 mg Oral Once  . lisinopril  5 mg Oral Daily  . metoprolol tartrate  12.5 mg Oral BID  . mometasone-formoterol  2 puff Inhalation BID  .  morphine injection  4 mg Intravenous Once  . pantoprazole  40 mg Oral Daily  . potassium chloride SA  10 mEq Oral Daily  . pravastatin  20 mg Oral Daily   Continuous Infusions: . methocarbamol (ROBAXIN)  IV 500 mg (05/31/17 1335)     LOS: 4 days    Time spent: 35 minutes.     Hosie Poisson, MD [facility] Pager [phone] If 7PM-7AM, please contact night-coverage [email or web address] Password [email or web address] 06/02/2017, 3:26 PM

## 2017-06-02 NOTE — Social Work (Addendum)
CSW called sons, Gerald Stabs and [name] and left both messages to call back to discuss bed offers. CSW will continue to follow up for disposition.  Elissa Hefty, LCSW Clinical Social Worker Weekend [phone]

## 2017-06-03 DIAGNOSIS — K219 Gastro-esophageal reflux disease without esophagitis: Secondary | ICD-10-CM | POA: Diagnosis not present

## 2017-06-03 DIAGNOSIS — R2689 Other abnormalities of gait and mobility: Secondary | ICD-10-CM | POA: Diagnosis not present

## 2017-06-03 DIAGNOSIS — R2681 Unsteadiness on feet: Secondary | ICD-10-CM | POA: Diagnosis not present

## 2017-06-03 DIAGNOSIS — M25551 Pain in right hip: Secondary | ICD-10-CM | POA: Diagnosis not present

## 2017-06-03 DIAGNOSIS — D72821 Monocytosis (symptomatic): Secondary | ICD-10-CM | POA: Diagnosis not present

## 2017-06-03 DIAGNOSIS — D72829 Elevated white blood cell count, unspecified: Secondary | ICD-10-CM | POA: Diagnosis not present

## 2017-06-03 DIAGNOSIS — M79604 Pain in right leg: Secondary | ICD-10-CM | POA: Diagnosis not present

## 2017-06-03 DIAGNOSIS — C9210 Chronic myeloid leukemia, BCR/ABL-positive, not having achieved remission: Secondary | ICD-10-CM | POA: Diagnosis not present

## 2017-06-03 DIAGNOSIS — I480 Paroxysmal atrial fibrillation: Secondary | ICD-10-CM | POA: Diagnosis not present

## 2017-06-03 DIAGNOSIS — D649 Anemia, unspecified: Secondary | ICD-10-CM | POA: Diagnosis not present

## 2017-06-03 DIAGNOSIS — R41841 Cognitive communication deficit: Secondary | ICD-10-CM | POA: Diagnosis not present

## 2017-06-03 DIAGNOSIS — I10 Essential (primary) hypertension: Secondary | ICD-10-CM | POA: Diagnosis not present

## 2017-06-03 DIAGNOSIS — M549 Dorsalgia, unspecified: Secondary | ICD-10-CM | POA: Diagnosis not present

## 2017-06-03 DIAGNOSIS — M545 Low back pain: Secondary | ICD-10-CM | POA: Diagnosis not present

## 2017-06-03 DIAGNOSIS — R600 Localized edema: Secondary | ICD-10-CM | POA: Diagnosis not present

## 2017-06-03 DIAGNOSIS — M6281 Muscle weakness (generalized): Secondary | ICD-10-CM | POA: Diagnosis not present

## 2017-06-03 DIAGNOSIS — M1990 Unspecified osteoarthritis, unspecified site: Secondary | ICD-10-CM | POA: Diagnosis not present

## 2017-06-03 LAB — CULTURE, BLOOD (ROUTINE X 2)
CULTURE: NO GROWTH
CULTURE: NO GROWTH
SPECIAL REQUESTS: ADEQUATE
Special Requests: ADEQUATE

## 2017-06-03 LAB — KAPPA/LAMBDA LIGHT CHAINS
KAPPA, LAMDA LIGHT CHAIN RATIO: 0.25 — AB (ref 0.26–1.65)
Kappa free light chain: 53.2 mg/L — ABNORMAL HIGH (ref 3.3–19.4)
LAMDA FREE LIGHT CHAINS: 215.7 mg/L — AB (ref 5.7–26.3)

## 2017-06-03 LAB — MULTIPLE MYELOMA PANEL, SERUM
ALBUMIN SERPL ELPH-MCNC: 2.9 g/dL (ref 2.9–4.4)
Albumin/Glob SerPl: 0.8 (ref 0.7–1.7)
Alpha 1: 0.3 g/dL (ref 0.0–0.4)
Alpha2 Glob SerPl Elph-Mcnc: 0.7 g/dL (ref 0.4–1.0)
B-GLOBULIN SERPL ELPH-MCNC: 1 g/dL (ref 0.7–1.3)
Gamma Glob SerPl Elph-Mcnc: 1.9 g/dL — ABNORMAL HIGH (ref 0.4–1.8)
Globulin, Total: 3.9 g/dL (ref 2.2–3.9)
IGA: 329 mg/dL (ref 64–422)
IGM (IMMUNOGLOBULIN M), SRM: 130 mg/dL (ref 26–217)
IgG (Immunoglobin G), Serum: 1932 mg/dL — ABNORMAL HIGH (ref 700–1600)
TOTAL PROTEIN ELP: 6.8 g/dL (ref 6.0–8.5)

## 2017-06-03 LAB — JAK2 GENOTYPR

## 2017-06-03 MED ORDER — APIXABAN 5 MG PO TABS
5.0000 mg | ORAL_TABLET | Freq: Two times a day (BID) | ORAL | Status: DC
  Administered 2017-06-03: 5 mg via ORAL
  Filled 2017-06-03: qty 1

## 2017-06-03 MED ORDER — APIXABAN 5 MG PO TABS: 5.0000 mg | ORAL_TABLET | Freq: Two times a day (BID) | ORAL | 0 refills | Status: DC

## 2017-06-03 MED ORDER — TRAMADOL HCL 50 MG PO TABS: 50.0000 mg | ORAL_TABLET | Freq: Two times a day (BID) | ORAL | 0 refills | Status: DC | PRN

## 2017-06-03 MED ORDER — METOPROLOL TARTRATE 25 MG PO TABS: 12.5000 mg | ORAL_TABLET | Freq: Two times a day (BID) | ORAL | 0 refills | Status: DC

## 2017-06-03 MED ORDER — GUAIFENESIN-DM 100-10 MG/5ML PO SYRP: 10.0000 mL | ORAL_SOLUTION | ORAL | 0 refills | Status: DC | PRN

## 2017-06-03 MED ORDER — GUAIFENESIN-DM 100-10 MG/5ML PO SYRP: 10.0000 mL | ORAL_SOLUTION | ORAL | Status: DC | PRN

## 2017-06-03 NOTE — Discharge Instructions (Signed)

## 2017-06-03 NOTE — Progress Notes (Addendum)
LCSW following for SNF placement:   Bed Chosen at [place] has approved SNF:   [address]  For 7 days  [facility] has been contacted and notified and ready for patient today if medically stable. All information given to facility.   Insurance company has updated SNF as well. LCSW will follow for discharge planning needs as they arise.  Lane Hacker, MSW Clinical Social Work: [employer] Coverage for :  [phone]

## 2017-06-03 NOTE — Clinical Social Work Placement (Signed)
   CLINICAL SOCIAL WORK PLACEMENT  NOTE  Date:  06/03/2017  Patient Details  Name: Erika [name] MRN: [id number] Date of Birth: [birth year]  Clinical Social Work is seeking post-discharge placement for this patient at the [place] level of care (*CSW will initial, date and re-position this form in  chart as items are completed):  Yes   Patient/family provided with [place] Work Department's list of facilities offering this level of care within the geographic area requested by the patient (or if unable, by the patient's family).  Yes   Patient/family informed of their freedom to choose among providers that offer the needed level of care, that participate in Medicare, Medicaid or managed care program needed by the patient, have an available bed and are willing to accept the patient.  Yes   Patient/family informed of [place]'s ownership interest in [facility] and [facility], as well as of the fact that they are under no obligation to receive care at these facilities.  PASRR submitted to EDS on       PASRR number received on       Existing PASRR number confirmed on       FL2 transmitted to all facilities in geographic area requested by pt/family on       FL2 transmitted to all facilities within larger geographic area on       Patient informed that his/her managed care company has contracts with or will negotiate with certain facilities, including the following:        Yes   Patient/family informed of bed offers received.  Patient chooses bed at [facility]     Physician recommends and patient chooses bed at      Patient to be transferred to [facility] on 06/03/17.  Patient to be transferred to facility by [facility]     Patient family notified on 06/03/17 of transfer.  Name of family member notified:        PHYSICIAN       Additional Comment:    _______________________________________________ [place]on Anna,  LCSW 06/03/2017, 2:19 PM

## 2017-06-03 NOTE — Care Management Note (Signed)
Case Management Note  Patient Details  Name: Erika [name] MRN: [id number] Date of Birth: 05/06/1941  Subjective/Objective: 76 y/o admitted w/leukocytosis. From home. PT recc SNF-CSW already following for SNF.                   Action/Plan:d/c plan SNF.   Expected Discharge Date:   (unknown)               Expected Discharge Plan:  Skilled Nursing Facility  In-House Referral:  Clinical Social Work  Discharge planning Services  CM Consult  Post Acute Care Choice:    Choice offered to:     DME Arranged:    DME Agency:     HH Arranged:    [place] Agency:     Status of Service:  Completed, signed off  If discussed at [employer], dates discussed:    Additional Comments:  Dessa Phi, RN 06/03/2017, 11:34 AM

## 2017-06-03 NOTE — Progress Notes (Signed)
ANTICOAGULATION CONSULT NOTE - Consult  Pharmacy Consult for Apixaban Indication: atrial fibrillation   Allergies  Allergen Reactions  . Prednisone Other (See Comments)    REACTION: Psychosis, talking out of head, insomnia    Patient Measurements: Height: 5\' 8"  (172.7 cm) Weight: 292 lb 15.9 oz (132.9 kg) IBW/kg (Calculated) : 63.9  Vital Signs: Temp: 97.8 F (36.6 C) (10/29 0511) Temp Source: Oral (10/29 0511) BP: 99/69 (10/29 1030) Pulse Rate: 97 (10/29 1204)  Labs:  Recent Labs  06/01/17 0509 06/02/17 1202  HGB 10.1*  --   HCT 30.8*  --   PLT 185  --   CREATININE 0.80 0.90    Estimated Creatinine Clearance: 79.2 mL/min (by C-G formula based on SCr of 0.9 mg/dL).   Assessment: 76 yoF admitted with back pain and leukocytosis, found to have atrial fibrillation during the admission.  Converted to sinus with metoprolol. CHADS-VASc 4, starting apixaban.  SCr 0.63, age <74 years old, weight >60 kg so does not meet any dose reduction criteria  Hgb 10.1, stable. Platelets WNL. Daily aspirin 81 mg stopped.  Will also d/c Lovenox for VTE prophylaxis.  Goal of Therapy:  Prevention of stroke and systemic embolism   Plan:  Apixaban 5 mg PO BID. Will provide education prior to discharge today.  Hershal Coria 06/03/2017,1:20 PM

## 2017-06-03 NOTE — Discharge Summary (Signed)
Physician Discharge Summary  Erika [name]:[id number] DOB: 1941/03/25 DOA: 05/29/2017  PCP: [name], MD  Admit date: 05/29/2017 Discharge date: 06/03/2017  Admitted From: [facility].  Disposition:  SNF  Recommendations for Outpatient Follow-up:  1. Follow up with PCP in 1-2 weeks 2. Please obtain BMP/CBC in one week Please follow up with hematology in one week for evaluation of leukocytosis.  Please follow up with cardiology for your atrial tachycardic,/ afib.   Discharge Condition: STABLE.  CODE STATUS: full code.  Diet recommendation: Heart Healthy   Brief/Interim Summary: Erika [name] a 76 y.o.female,w hypertension, asthma, anemia, TB in 2002, apparently c/o back pain x2 days. She underwent MRI of the lumbar spine showing Left laminectomy defects at the L4 through S1 levels. 2. Mild degenerative changes at the L3-4, L4-5 and L5-S1 levels, as described above. 3. No focal disc protrusions or extrusions and no evidence of neural compression any level.  She was also found to have leukocytosis of unclear etiology.  Hematology consulted and awaiting recommendations. So far no source of infection found.   Discharge Diagnoses:  Principal Problem:   Leukocytosis Active Problems:   Hyperlipidemia   Obesity, Class III, BMI 40-49.9 (morbid obesity) (HCC)   Essential hypertension   Moderate persistent asthma   OA (osteoarthritis of spine)   OA (osteoarthritis) of knee   GERD (gastroesophageal reflux disease)   Anemia   AF (paroxysmal atrial fibrillation) (HCC)  Back pain:  Possibly muscle spasms. Improving with robaxin, flexiril, K PAD. Pain control and physical therapy evaluation.  PT recommended SNF. Patient and family thinking about the SNF.    Leukocytosis: Differential show elevated absolute neutrophil count.  Hematology consulted and CT chest with contrast ordered for further evaluation.  CT chest shows mild pulm edema.  Myeloma panel shows  hyperglobulinemia of unclear significance.  FURTHER work up of the leukocytosis as per hematology as outpatient.     Anemia, normocytic.  Unclear etiology.  Anemia panel ordered and reviewed. Normal ferritin and iron.  Hemoglobin stable at 10.    Mild thrombocytopenia: No evidence of bleeding. Resolved.    Hypertension:  Controlled.   Asthma:  No wheezing heard.    Morbid obesity:  Weight loss and life style modification.    Paroxysmal atrial fibrillation:  Converted to sinus with IV metoprolol.  Echo ordered. Reviewed.  She remains asymptomatic.  Started her on po metoprolol 12.5 mg bid and currently she is in sinus.  Mali vasc2  Score is 3 and after discussing with Dr Radford Pax we started her on Eliquis.  Recommend outpatient follow up with cardiology.     GERD: Stable.     Discharge Instructions  Discharge Instructions    Diet - low sodium heart healthy    Complete by:  As directed    Discharge instructions    Complete by:  As directed    Follow up with hematology as recommended in one week.     Allergies as of 06/03/2017      Reactions   Prednisone Other (See Comments)   REACTION: Psychosis, talking out of head, insomnia      Medication List    STOP taking these medications   amLODipine 10 MG tablet Commonly known as:  NORVASC     TAKE these medications   albuterol 108 (90 Base) MCG/ACT inhaler Commonly known as:  PROAIR HFA inhale 1 to 2 puffs every 6 hours if needed   aspirin EC 81 MG tablet Take 81 mg by mouth daily.  Fluticasone-Salmeterol 500-50 MCG/DOSE Aepb Commonly known as:  ADVAIR DISKUS INHALE 1 PUFF BY MOUTH TWICE A DAY   furosemide 20 MG tablet Commonly known as:  LASIX take 1 tablet by mouth once daily if needed for SWELLING IN LEGS OR FEET   lisinopril 5 MG tablet Commonly known as:  PRINIVIL,ZESTRIL Take 1 tablet (5 mg total) by mouth daily.   metoprolol tartrate 25 MG tablet Commonly known as:   LOPRESSOR Take 0.5 tablets (12.5 mg total) by mouth 2 (two) times daily.   omeprazole 20 MG capsule Commonly known as:  PRILOSEC Take 1 capsule (20 mg total) by mouth daily.   potassium chloride SA 20 MEQ tablet Commonly known as:  K-DUR,KLOR-CON Take 0.5 tablets (10 mEq total) by mouth daily.   pravastatin 20 MG tablet Commonly known as:  PRAVACHOL Take 1 tablet (20 mg total) by mouth daily.   traMADol 50 MG tablet Commonly known as:  ULTRAM Take 1 tablet (50 mg total) by mouth every 12 (twelve) hours as needed.       Contact information for follow-up providers    [name], MD. Schedule an appointment as soon as possible for a visit in 1 week(s).   Specialty:  Internal Medicine Contact information: [place] [address] [phone]        Brunetta Genera, MD. Schedule an appointment as soon as possible for a visit in 1 week(s).   Specialties:  Hematology, Oncology Contact information: [place] [address] [phone]            Contact information for after-discharge care    Destination    [facility] Follow up.   Specialty:  Skilled Nursing Facility Contact information: [place] [address] [phone]                 Allergies  Allergen Reactions  . Prednisone Other (See Comments)    REACTION: Psychosis, talking out of head, insomnia    Consultations:  Hematology oncology.    Procedures/Studies: Ct Chest W Contrast  Result Date: 05/31/2017 CLINICAL DATA:  Leukocytosis.  Back pain.  Muscle spasms. EXAM: CT CHEST WITH CONTRAST TECHNIQUE: Multidetector CT imaging of the chest was performed during intravenous contrast administration. CONTRAST:  89mL ISOVUE-300 IOPAMIDOL (ISOVUE-300) INJECTION 61% COMPARISON:  05/29/2017 FINDINGS: Cardiovascular: No significant vascular findings. Normal heart size. No pericardial effusion. Mediastinum/Nodes: No axillary  supraclavicular adenopathy. No mediastinal hilar adenopathy. Subcarinal lymph node is enlarged measuring 22 mm short access Lungs/Pleura: Small bilateral effusions. No suspicious pulmonary nodules. Diffuse ground-glass opacities suggesting pulmonary edema. Upper Abdomen: Limited view of the liver, kidneys, pancreas are unremarkable. Normal adrenal glands. Musculoskeletal: No aggressive osseous lesion IMPRESSION: 1. Mild ground-glass opacities suggest mild pulmonary edema. 2. Small bilateral pleural effusions and basilar atelectasis. 3. No pulmonary infiltrate. 4. Mild subcarinal mediastinal adenopathy is likely reactive. 5. Cardiomegaly. Electronically Signed   By: Suzy Bouchard M.D.   On: 05/31/2017 15:43   Mr Lumbar Spine W Wo Contrast  Result Date: 05/29/2017 CLINICAL DATA:  Back pain for the past 2 days. 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 EXAM: MRI LUMBAR SPINE WITHOUT AND WITH CONTRAST TECHNIQUE: Multiplanar and multiecho pulse sequences of the lumbar spine were obtained without and with intravenous contrast. CONTRAST:  71mL MULTIHANCE GADOBENATE DIMEGLUMINE 529 MG/ML IV SOLN COMPARISON:  Abdomen pelvis CT obtained today. FINDINGS: Segmentation: 5 non-rib-bearing lumbar vertebrae with  the last open disc space at the L5-S1 level. Alignment:  Normal. Vertebrae: Left-sided laminectomy defects extending from the L4 to the S1 level. No fractures or marrow edema. Conus medullaris: Extends to the mid L2 level and appears normal. Paraspinal and other soft tissues: Unremarkable. Diffuse disc degeneration throughout the lumbar and lower thoracic spine. Disc levels: L1-2: Normal. L2-3: Normal. L3-4: Mild diffuse posterior disc protrusion. Bilateral ligamentum flavum hypertrophy. These changes are producing mild canal stenosis without significant foraminal stenosis. L4-5: Mild to moderate diffuse posterior disc protrusion. Moderate ligamentum flavum  hypertrophy on the right. These changes are producing mild-to-moderate foraminal stenosis on the right. The disc bulging is causing mild foraminal stenosis on the left. There is also mild canal stenosis. L5-S1: Minimal diffuse posterior disc protrusion. Mild ligamentum flavum hypertrophy on the right. Mild to moderate facet hypertrophy on the right. These changes are producing mild foraminal stenosis on the right and minimal foraminal stenosis on the left. No significant canal stenosis. IMPRESSION: 1. Left laminectomy defects at the L4 through S1 levels. 2. Mild degenerative changes at the L3-4, L4-5 and L5-S1 levels, as described above. 3. No focal disc protrusions or extrusions and no evidence of neural compression any level. 4. No evidence of tumor recurrence. Electronically Signed   By: Claudie Revering M.D.   On: 05/29/2017 21:46   Ct Abdomen Pelvis W Contrast  Result Date: 05/29/2017 CLINICAL DATA:  Low back and abdominal pain for 2 weeks. EXAM: CT ABDOMEN AND PELVIS WITH CONTRAST TECHNIQUE: Multidetector CT imaging of the abdomen and pelvis was performed using the standard protocol following bolus administration of intravenous contrast. CONTRAST:  170mL ISOVUE-300 IOPAMIDOL (ISOVUE-300) INJECTION 61% COMPARISON:  None. FINDINGS: Lower Chest: No acute findings. Hepatobiliary: No hepatic masses identified. Prior cholecystectomy. Stable mild biliary ductal dilatation, likely related to prior cholecystectomy. Pancreas:  No mass or inflammatory changes. Spleen: Within normal limits in size and appearance. Adrenals/Urinary Tract: No masses identified. No evidence of hydronephrosis. Stomach/Bowel: No evidence of obstruction, inflammatory process or abnormal fluid collections. Vascular/Lymphatic: No pathologically enlarged lymph nodes. No abdominal aortic aneurysm. Aortic atherosclerosis. Reproductive: 12 mm calcified fibroid in left uterine fundus. Adnexal regions are unremarkable. Other: Small right paraumbilical  ventral hernia containing only fat. No evidence of herniated bowel loops. Musculoskeletal:  No suspicious bone lesions identified. IMPRESSION: No acute findings within the abdomen or pelvis. Small calcified uterine fibroid.  That Small right paraumbilical ventral hernia containing only fat. Electronically Signed   By: Earle Gell M.D.   On: 05/29/2017 16:58   Dg Chest Portable 1 View  Result Date: 05/29/2017 CLINICAL DATA:  C/o pain RLQ x 2 days, no chest pain or sob, no cough or congestion, hx. HTN, non smoker EXAM: PORTABLE CHEST 1 VIEW COMPARISON:  Chest radiograph 02/03/2012 FINDINGS: Stable large cardiac silhouette. There is central venous congestion. No effusion, infiltrate or pneumothorax. IMPRESSION: Cardiomegaly central venous congestion.  No clear acute findings. Electronically Signed   By: Suzy Bouchard M.D.   On: 05/29/2017 15:31       Subjective: No new complaints.   Discharge Exam: Vitals:   06/03/17 1030 06/03/17 1204  BP: 99/69   Pulse:  97  Resp:    Temp:    SpO2:     Vitals:   06/03/17 0513 06/03/17 0736 06/03/17 1030 06/03/17 1204  BP: 112/78  99/69   Pulse:    97  Resp:      Temp:      TempSrc:      SpO2:  93%    Weight:      Height:        General: Pt is alert, awake, not in acute distress Cardiovascular: RRR, S1/S2 +, no rubs, no gallops Respiratory: CTA bilaterally, no wheezing, no rhonchi Abdominal: Soft, NT, ND, bowel sounds + Extremities: no edema, no cyanosis    The results of significant diagnostics from this hospitalization (including imaging, microbiology, ancillary and laboratory) are listed below for reference.     Microbiology: Recent Results (from the past 240 hour(s))  Blood culture (routine x 2)     Status: None (Preliminary result)   Collection Time: 05/29/17  3:00 PM  Result Value Ref Range Status   Specimen Description BLOOD RIGHT ANTECUBITAL  Final   Special Requests   Final    BOTTLES DRAWN AEROBIC AND ANAEROBIC Blood  Culture adequate volume   Culture   Final    NO GROWTH 4 DAYS Performed at [facility], [address], [place], [place] [address]    Report Status PENDING  Incomplete  Blood culture (routine x 2)     Status: None (Preliminary result)   Collection Time: 05/29/17  3:05 PM  Result Value Ref Range Status   Specimen Description BLOOD LEFT HAND  Final   Special Requests   Final    BOTTLES DRAWN AEROBIC AND ANAEROBIC Blood Culture adequate volume   Culture   Final    NO GROWTH 4 DAYS Performed at [facility], [place] [address], [place], [place] [address]    Report Status PENDING  Incomplete     Labs: BNP (last 3 results) No results for input(s): BNP in the last 8760 hours. Basic Metabolic Panel:  Recent Labs Lab 05/29/17 1416 05/30/17 0439 05/31/17 0450 06/01/17 0509 06/02/17 1202  NA 133* 137 138 141 137  K 4.1 3.7 3.6 3.8 4.0  CL 97* 103 104 104 101  CO2 25 25 24 28 26   GLUCOSE 91 79 84 92 97  BUN 19 16 11 8 12   CREATININE 1.07* 0.96 0.85 0.80 0.90  CALCIUM 9.0 8.9 8.7* 9.1 9.0   Liver Function Tests:  Recent Labs Lab 05/29/17 1416 05/30/17 0439  AST 17 21  ALT 11* 10*  ALKPHOS 161* 182*  BILITOT 0.6 0.5  PROT 8.0 7.3  ALBUMIN 3.6 3.2*    Recent Labs Lab 05/29/17 1416  LIPASE 16   No results for input(s): AMMONIA in the last 168 hours. CBC:  Recent Labs Lab 05/29/17 1416 05/30/17 0439 05/31/17 0450 06/01/17 0509  WBC 55.2* 45.1* 47.0* 54.0*  NEUTROABS 48.8* 39.1*  --   --   HGB 11.5* 10.5* 10.4* 10.1*  HCT 34.8* 31.7*  33.1* 31.6* 30.8*  MCV 92.8 92.7 93.2 92.8  PLT 163 143* 165 185   Cardiac Enzymes: No results for input(s): CKTOTAL, CKMB, CKMBINDEX, TROPONINI in the last 168 hours. BNP: Invalid input(s): POCBNP CBG: No results for input(s): GLUCAP in the last 168 hours. D-Dimer No results for input(s): DDIMER in the last 72 hours. Hgb A1c No results for input(s): HGBA1C in the last 72 hours. Lipid Profile No results for  input(s): CHOL, HDL, LDLCALC, TRIG, CHOLHDL, LDLDIRECT in the last 72 hours. Thyroid function studies No results for input(s): TSH, T4TOTAL, T3FREE, THYROIDAB in the last 72 hours.  Invalid input(s): FREET3 Anemia work up No results for input(s): VITAMINB12, FOLATE, FERRITIN, TIBC, IRON, RETICCTPCT in the last 72 hours. Urinalysis    Component Value Date/Time   COLORURINE YELLOW 05/29/2017 1416  APPEARANCEUR CLEAR 05/29/2017 1416   LABSPEC 1.024 05/29/2017 1416   PHURINE 5.0 05/29/2017 1416   GLUCOSEU NEGATIVE 05/29/2017 1416   HGBUR SMALL (A) 05/29/2017 1416   BILIRUBINUR NEGATIVE 05/29/2017 1416   KETONESUR NEGATIVE 05/29/2017 1416   PROTEINUR NEGATIVE 05/29/2017 1416   UROBILINOGEN 1.0 09/09/2012 0051   NITRITE NEGATIVE 05/29/2017 1416   LEUKOCYTESUR NEGATIVE 05/29/2017 1416   Sepsis Labs Invalid input(s): PROCALCITONIN,  WBC,  LACTICIDVEN Microbiology Recent Results (from the past 240 hour(s))  Blood culture (routine x 2)     Status: None (Preliminary result)   Collection Time: 05/29/17  3:00 PM  Result Value Ref Range Status   Specimen Description BLOOD RIGHT ANTECUBITAL  Final   Special Requests   Final    BOTTLES DRAWN AEROBIC AND ANAEROBIC Blood Culture adequate volume   Culture   Final    NO GROWTH 4 DAYS Performed at [facility], [place] [address], [place], [place] [address]    Report Status PENDING  Incomplete  Blood culture (routine x 2)     Status: None (Preliminary result)   Collection Time: 05/29/17  3:05 PM  Result Value Ref Range Status   Specimen Description BLOOD LEFT HAND  Final   Special Requests   Final    BOTTLES DRAWN AEROBIC AND ANAEROBIC Blood Culture adequate volume   Culture   Final    NO GROWTH 4 DAYS Performed at [facility], [place] [address] [place], [place] [address]    Report Status PENDING  Incomplete     Time coordinating discharge: Over 30 minutes  SIGNED:   Hosie Poisson, MD  [facility] 06/03/2017, 12:28  PM Pager   If 7PM-7AM, please contact night-coverage [email or web address] Password [email or web address]

## 2017-06-03 NOTE — Care Management Important Message (Signed)
Important Message  Patient Details  Name: Erika [name] MRN: [id number] Date of Birth: 1941/02/18   Medicare Important Message Given:  Yes    Kerin Salen 06/03/2017, 10:52 AMImportant Message  Patient Details  Name: Erika [name] MRN: [id number] Date of Birth: 06/02/1941   Medicare Important Message Given:  Yes    Kerin Salen 06/03/2017, 10:52 AM

## 2017-06-03 NOTE — Progress Notes (Signed)
Report called to Rudean Haskell at [facility].  All questions answered.  VSS. Pt transported via son.  Discharge packet and papers given to patient.

## 2017-06-03 NOTE — Progress Notes (Signed)
Patient is set to discharge to [facility] today. Patient aware and notified family for transportation. Discharge packet given to RN. Patients son is providing transporting.   Kingsley Spittle, LCSWA Clinical Social Worker [phone]

## 2017-06-05 LAB — BCR-ABL1, CML/ALL, PCR, QUANT

## 2017-06-07 DIAGNOSIS — R2681 Unsteadiness on feet: Secondary | ICD-10-CM | POA: Diagnosis not present

## 2017-06-07 DIAGNOSIS — M79604 Pain in right leg: Secondary | ICD-10-CM | POA: Diagnosis not present

## 2017-06-07 DIAGNOSIS — D72829 Elevated white blood cell count, unspecified: Secondary | ICD-10-CM | POA: Diagnosis not present

## 2017-06-07 DIAGNOSIS — M545 Low back pain: Secondary | ICD-10-CM | POA: Diagnosis not present

## 2017-06-08 ENCOUNTER — Telehealth: Payer: Self-pay | Admitting: Hematology

## 2017-06-08 NOTE — Telephone Encounter (Signed)
Spoke with patient re hosp f/u 11/7 per staff message. Date per patient due to children will want to be with her.

## 2017-06-10 DIAGNOSIS — R2681 Unsteadiness on feet: Secondary | ICD-10-CM | POA: Diagnosis not present

## 2017-06-10 DIAGNOSIS — M549 Dorsalgia, unspecified: Secondary | ICD-10-CM | POA: Diagnosis not present

## 2017-06-10 DIAGNOSIS — D72829 Elevated white blood cell count, unspecified: Secondary | ICD-10-CM | POA: Diagnosis not present

## 2017-06-10 DIAGNOSIS — M545 Low back pain: Secondary | ICD-10-CM | POA: Diagnosis not present

## 2017-06-10 DIAGNOSIS — M1990 Unspecified osteoarthritis, unspecified site: Secondary | ICD-10-CM | POA: Diagnosis not present

## 2017-06-10 DIAGNOSIS — M79604 Pain in right leg: Secondary | ICD-10-CM | POA: Diagnosis not present

## 2017-06-11 ENCOUNTER — Ambulatory Visit: Payer: PPO

## 2017-06-11 ENCOUNTER — Telehealth: Payer: Self-pay

## 2017-06-11 DIAGNOSIS — R600 Localized edema: Secondary | ICD-10-CM | POA: Diagnosis not present

## 2017-06-11 DIAGNOSIS — M25551 Pain in right hip: Secondary | ICD-10-CM | POA: Diagnosis not present

## 2017-06-11 DIAGNOSIS — M549 Dorsalgia, unspecified: Secondary | ICD-10-CM | POA: Diagnosis not present

## 2017-06-11 DIAGNOSIS — D72829 Elevated white blood cell count, unspecified: Secondary | ICD-10-CM

## 2017-06-11 NOTE — Telephone Encounter (Signed)
Pt scheduled for 2:15 LAB prior to 2:40 Dr appt.  Desk RN left voicemail with pt regarding appt time and to call back at [phone] if she has any questions.

## 2017-06-12 ENCOUNTER — Other Ambulatory Visit (HOSPITAL_BASED_OUTPATIENT_CLINIC_OR_DEPARTMENT_OTHER): Payer: PPO

## 2017-06-12 ENCOUNTER — Encounter: Payer: Self-pay | Admitting: Hematology

## 2017-06-12 ENCOUNTER — Telehealth: Payer: Self-pay | Admitting: Hematology

## 2017-06-12 ENCOUNTER — Ambulatory Visit (HOSPITAL_BASED_OUTPATIENT_CLINIC_OR_DEPARTMENT_OTHER): Payer: PPO | Admitting: Hematology

## 2017-06-12 VITALS — BP 149/84 | HR 130 | Temp 98.3°F | Resp 20 | Ht 68.0 in | Wt 304.7 lb

## 2017-06-12 DIAGNOSIS — D649 Anemia, unspecified: Secondary | ICD-10-CM | POA: Diagnosis not present

## 2017-06-12 DIAGNOSIS — D72829 Elevated white blood cell count, unspecified: Secondary | ICD-10-CM | POA: Diagnosis not present

## 2017-06-12 DIAGNOSIS — D72821 Monocytosis (symptomatic): Secondary | ICD-10-CM | POA: Diagnosis not present

## 2017-06-12 LAB — CBC WITH DIFFERENTIAL/PLATELET
BASO%: 0.2 % (ref 0.0–2.0)
BASOS ABS: 0.1 10*3/uL (ref 0.0–0.1)
EOS ABS: 0.5 10*3/uL (ref 0.0–0.5)
EOS%: 1.1 % (ref 0.0–7.0)
HCT: 33.4 % — ABNORMAL LOW (ref 34.8–46.6)
HEMOGLOBIN: 10.7 g/dL — AB (ref 11.6–15.9)
LYMPH%: 6.2 % — ABNORMAL LOW (ref 14.0–49.7)
MCH: 30.5 pg (ref 25.1–34.0)
MCHC: 32 g/dL (ref 31.5–36.0)
MCV: 95.2 fL (ref 79.5–101.0)
MONO#: 1.6 10*3/uL — AB (ref 0.1–0.9)
MONO%: 3.7 % (ref 0.0–14.0)
NEUT%: 88.8 % — ABNORMAL HIGH (ref 38.4–76.8)
NEUTROS ABS: 37.6 10*3/uL — AB (ref 1.5–6.5)
PLATELETS: 214 10*3/uL (ref 145–400)
RBC: 3.51 10*6/uL — ABNORMAL LOW (ref 3.70–5.45)
RDW: 15.5 % — AB (ref 11.2–14.5)
WBC: 42.4 10*3/uL — AB (ref 3.9–10.3)
lymph#: 2.6 10*3/uL (ref 0.9–3.3)

## 2017-06-12 LAB — COMPREHENSIVE METABOLIC PANEL
ALT: 13 U/L (ref 0–55)
ANION GAP: 8 meq/L (ref 3–11)
AST: 14 U/L (ref 5–34)
Albumin: 3.2 g/dL — ABNORMAL LOW (ref 3.5–5.0)
Alkaline Phosphatase: 202 U/L — ABNORMAL HIGH (ref 40–150)
BUN: 10.8 mg/dL (ref 7.0–26.0)
CALCIUM: 9.3 mg/dL (ref 8.4–10.4)
CHLORIDE: 106 meq/L (ref 98–109)
CO2: 26 meq/L (ref 22–29)
CREATININE: 1 mg/dL (ref 0.6–1.1)
EGFR: >60 mL/min/{1.73_m2} (ref >60)
Glucose: 99 mg/dl (ref 70–140)
POTASSIUM: 4.4 meq/L (ref 3.5–5.1)
Sodium: 140 mEq/L (ref 136–145)
Total Bilirubin: 0.3 mg/dL (ref 0.20–1.20)
Total Protein: 7.7 g/dL (ref 6.4–8.3)

## 2017-06-12 LAB — LACTATE DEHYDROGENASE: LDH: 340 U/L — AB (ref 125–245)

## 2017-06-12 LAB — TECHNOLOGIST REVIEW

## 2017-06-12 NOTE — Progress Notes (Signed)
HEMATOLOGY/ONCOLOGY CLINIC NOTE  Date of Service: 06/12/2017  Inpatient Attending: .Brunetta Genera, MD  CC: leucocytosis  HPI  Patient is a very pleasant 76 year old female with a history of morbid obesity, anemia, asthma, hypertension, lumbar spinal TB in 2002 who presented with acute onset of lower back pain on Monday. She reports that she drank some pepsi and tea on that day and her back pain began shortly after. No initial injury to precipitate her back pain. It gradually worsened until she was seen in the ED on 05/30/17. Labs during admission revealed WBC count of 55.2k with predominantly neutrophilia of 48.8k and some monocytosis at 2.2k.   She had minimal anemia at 11.5 with an MCV of 92 and a normal platelet count of 163k.  At that time she had CT A/P while in the ED which was normal and without splenomegaly. Additionally, she also had MRI of the L-spine which only showed degenerative changes. She was subsequently admitted for further workup and pain control and to rule out a source of infection.   Of note, the patient has a remote prior h/o epidural mass in 2002 (hemilaminectomy and decompression of tumor in epidural space 2002) which was thought to be secondary to lumbar epidural tuberculosis. She was placed on a course of antibiotics for several months following her diagnosis.   Following her admission, we were consulted for further workup. No recent new medications. She reports that her inhaler does contain steroids, but otherwise she has not been on a course of steroids recently. She is UTD with her regular cancer screenings. She does not feel any different now than she did 40moto a year ago.  No focal symptoms suggestive of infection other than the back pain at this time.  On ROS, she denies fever, chills, night sweats, unexpected weight loss, abdominal pain, sore throat, joint swelling, or any other associated symptoms. Her back pain is much improved with muscle  relaxants. She does note some right great toe pain, but otherwise she has no other acute symptoms.   OBJECTIVE:  MMAKINZEE DURLEYis her for fu of persistent leucocytosis/neutrophilia. She was recently admitted and had a septic workup which was unrevealing for an overt source of infection. No focal symptoms suggestive of malignancy.   Pt underwent CT chest with results showing: IMPRESSION: 1. Mild ground-glass opacities suggest mild pulmonary edema. 2. Small bilateral pleural effusions and basilar atelectasis. 3. No pulmonary infiltrate.4. Mild subcarinal mediastinal adenopathy is likely reactive. 5. Cardiomegaly. Her MM panel showed IgG elevated at 1,932 on 05/31/2017 but no M spike and increased lambda light chains.  Jak2 mutation and BCR-ABL mutation studies neg.  She is accompanied today by her son and her niece. She states that she is doing well overall. She is currently at a rehab facility and is receiving physical therapy. She notes swelling in her feet and states she has experienced this before in 2017.   On review of systems, pt reports swelling in the feet, and denies weight loss, changes in BM, dysuria, bloody urine, bloody stools, abdominal pain and any other accompanying symptoms.     NAD  PHYSICAL EXAMINATION: . Vitals:   06/12/17 1455  BP: (!) 149/84  Pulse: (!) 130  Resp: 20  Temp: 98.3 F (36.8 C)  TempSrc: Oral  SpO2: 98%  Weight: (!) 304 lb 11.2 oz (138.2 kg)  Height: 5' 8"  (1.727 m)   Filed Weights   06/12/17 1455  Weight: (!) 304 lb 11.2 oz (138.2 kg)   .  Body mass index is 46.33 kg/m.  GENERAL:alert, in no acute distress and comfortable SKIN: skin color, texture, turgor are normal, no rashes or significant lesions EYES: normal, conjunctiva are pink and non-injected, sclera clear OROPHARYNX:no exudate, no erythema and lips, buccal mucosa, and tongue normal  NECK: supple, no JVD, thyroid normal size, non-tender, without nodularity LYMPH:  no apparent  palpable lymphadenopathy in the cervical, axillary or inguinal LUNGS: clear to auscultation with normal respiratory effort HEART: regular rate & rhythm,  no murmurs and no lower extremity edema ABDOMEN: abdomen obese soft, non-tender,distant bowel sounds , no overly palpable hepato-splenomegaly Musculoskeletal: no cyanosis of digits and no clubbing  PSYCH: alert & oriented x 3 with fluent speech NEURO: no focal motor/sensory deficits  MEDICAL HISTORY:  Past Medical History:  Diagnosis Date  . Anemia    with menses  . Asthma   . Back pain    status post surgery 2002  . Breast cyst    Excesion with FNA, begnin in 2004.   . Degenerative joint disease of spine    Imaging 2005,  Degenerative hypertrophic facet arthritis changes L4-5 and L5-S1..   . History of shingles    Recurrent with post herpetic neuralgia.   [name] Hypertension   . Lymphadenopathy    Of the mediastinum, Right side CXR 2008, not read on 2010 cxr.   . Menopause   . Obesity    BMI 54  . 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  . Shingles   . Stasis dermatitis    W/ LE edema, prviously on lasix now on mazxide.   . SVT (supraventricular tachycardia) [facility]) June 2009   one run while hospitalized  . Tuberculosis    active TB treated in 2002, hx of paraspinal lumbar TB,     SURGICAL HISTORY: Past Surgical History:  Procedure Laterality Date  . BREAST CYST ASPIRATION Left   . Breast cyst biopsy    . Hemilaminectomy of L4, L5 and S1 decompression of tumor in epidural space  2000    SOCIAL HISTORY: Social History   Socioeconomic History  . Marital status: Widowed    Spouse name: Not on file  . Number of children: Not on file  . Years of education: Not on file  . Highest education level: Not on file  Social Needs  . Financial resource strain: Not on file  . Food insecurity - worry: Not on file  . Food insecurity - inability: Not on file  . Transportation needs - medical: Not on file  . Transportation  needs - non-medical: Not on file  Occupational History  . 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 Smoking status: Never Smoker  . Smokeless tobacco: Never Used  Substance and Sexual Activity  . Alcohol use: No    Alcohol/week: 0.0 oz  . Drug use: No  . Sexual activity: No    Birth control/protection: Post-menopausal  Other Topics Concern  . Not on file  Social History Narrative   No regular exercise, retired, single, volunteers at a local school.     FAMILY HISTORY: Family History  Problem Relation Age of Onset  . Stroke Mother        at young age  . Stroke Father        in 79's  . Heart attack Father   . Stroke Brother   . Stroke Brother   . Breast cancer Other  ALLERGIES:  is allergic to prednisone.  MEDICATIONS:  . Current Outpatient Medications:  .  albuterol (PROAIR HFA) 108 (90 Base) MCG/ACT inhaler, inhale 1 to 2 puffs every 6 hours if needed, Disp: 8.5 g, Rfl: 6 .  apixaban (ELIQUIS) 5 MG TABS tablet, Take 1 tablet (5 mg total) by mouth 2 (two) times daily., Disp: 60 tablet, Rfl: 0 .  aspirin EC 81 MG tablet, Take 81 mg by mouth daily., Disp: , Rfl:  .  Fluticasone-Salmeterol (ADVAIR DISKUS) 500-50 MCG/DOSE AEPB, INHALE 1 PUFF BY MOUTH TWICE A DAY, Disp: 180 each, Rfl: 3 .  furosemide (LASIX) 20 MG tablet, take 1 tablet by mouth once daily if needed for SWELLING IN LEGS OR FEET, Disp: 30 tablet, Rfl: 3 .  guaiFENesin-dextromethorphan (ROBITUSSIN DM) 100-10 MG/5ML syrup, Take 10 mLs by mouth every 4 (four) hours as needed for cough., Disp: 118 mL, Rfl: 0 .  lisinopril (PRINIVIL,ZESTRIL) 5 MG tablet, Take 1 tablet (5 mg total) by mouth daily., Disp: 30 tablet, Rfl: 6 .  metoprolol tartrate (LOPRESSOR) 25 MG tablet, Take 0.5 tablets (12.5 mg total) by mouth 2 (two) times daily., Disp: 60 tablet, Rfl: 0 .  omeprazole (PRILOSEC) 20 MG capsule, Take 1 capsule (20 mg total) by mouth daily., Disp: 90  capsule, Rfl: 1 .  potassium chloride SA (K-DUR,KLOR-CON) 20 MEQ tablet, Take 0.5 tablets (10 mEq total) by mouth daily., Disp: 90 tablet, Rfl: 1 .  pravastatin (PRAVACHOL) 20 MG tablet, Take 1 tablet (20 mg total) by mouth daily., Disp: 90 tablet, Rfl: 3 .  traMADol (ULTRAM) 50 MG tablet, Take 1 tablet (50 mg total) by mouth every 12 (twelve) hours as needed., Disp: 6 tablet, Rfl: 0   REVIEW OF SYSTEMS:    10 Point review of Systems was done is negative except as noted above.   LABORATORY DATA:  I have reviewed the data as listed   .      CBC Latest Ref Rng & Units 06/12/2017 06/01/2017 05/31/2017  WBC 3.9 - 10.3 10e3/uL 42.4(H) 54.0(HH) 47.0(H)  Hemoglobin 11.6 - 15.9 g/dL 10.7(L) 10.1(L) 10.4(L)  Hematocrit 34.8 - 46.6 % 33.4(L) 30.8(L) 31.6(L)  Platelets 145 - 400 10e3/uL 214 185 165   . CBC    Component Value Date/Time   WBC 42.4 (H) 06/12/2017 1439   WBC 54.0 (HH) 06/01/2017 0509   RBC 3.51 (L) 06/12/2017 1439   RBC 3.32 (L) 06/01/2017 0509   HGB 10.7 (L) 06/12/2017 1439   HCT 33.4 (L) 06/12/2017 1439   PLT 214 06/12/2017 1439   MCV 95.2 06/12/2017 1439   MCH 30.5 06/12/2017 1439   MCH 30.4 06/01/2017 0509   MCHC 32.0 06/12/2017 1439   MCHC 32.8 06/01/2017 0509   RDW 15.5 (H) 06/12/2017 1439   LYMPHSABS 2.6 06/12/2017 1439   MONOABS 1.6 (H) 06/12/2017 1439   EOSABS 0.5 06/12/2017 1439   BASOSABS 0.1 06/12/2017 1439   . CMP Latest Ref Rng & Units 06/12/2017 06/02/2017 06/01/2017  Glucose 70 - 140 mg/dl 99 97 92  BUN 7.0 - 26.0 mg/dL 10.8 12 8   Creatinine 0.6 - 1.1 mg/dL 1.0 0.90 0.80  Sodium 136 - 145 mEq/L 140 137 141  Potassium 3.5 - 5.1 mEq/L 4.4 4.0 3.8  Chloride 101 - 111 mmol/L - 101 104  CO2 22 - 29 mEq/L 26 26 28   Calcium 8.4 - 10.4 mg/dL 9.3 9.0 9.1  Total Protein 6.4 - 8.3 g/dL 7.7 - -  Total Bilirubin 0.20 - 1.20 mg/dL  0.30 - -  Alkaline Phos 40 - 150 U/L 202(H) - -  AST 5 - 34 U/L 14 - -  ALT 0 - 55 U/L 13 - -     RADIOGRAPHIC  STUDIES: I have personally reviewed the radiological images as listed and agreed with the findings in the report. Ct Chest W Contrast  Result Date: 05/31/2017 CLINICAL DATA:  Leukocytosis.  Back pain.  Muscle spasms. EXAM: CT CHEST WITH CONTRAST TECHNIQUE: Multidetector CT imaging of the chest was performed during intravenous contrast administration. CONTRAST:  19m ISOVUE-300 IOPAMIDOL (ISOVUE-300) INJECTION 61% COMPARISON:  05/29/2017 FINDINGS: Cardiovascular: No significant vascular findings. Normal heart size. No pericardial effusion. Mediastinum/Nodes: No axillary supraclavicular adenopathy. No mediastinal hilar adenopathy. Subcarinal lymph node is enlarged measuring 22 mm short access Lungs/Pleura: Small bilateral effusions. No suspicious pulmonary nodules. Diffuse ground-glass opacities suggesting pulmonary edema. Upper Abdomen: Limited view of the liver, kidneys, pancreas are unremarkable. Normal adrenal glands. Musculoskeletal: No aggressive osseous lesion IMPRESSION: 1. Mild ground-glass opacities suggest mild pulmonary edema. 2. Small bilateral pleural effusions and basilar atelectasis. 3. No pulmonary infiltrate. 4. Mild subcarinal mediastinal adenopathy is likely reactive. 5. Cardiomegaly. Electronically Signed   By: SSuzy BouchardM.D.   On: 05/31/2017 15:43   Mr Lumbar Spine W Wo Contrast  Result Date: 05/29/2017 CLINICAL DATA:  Back pain for the past 2 days. 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 EXAM: MRI LUMBAR SPINE WITHOUT AND WITH CONTRAST TECHNIQUE: Multiplanar and multiecho pulse sequences of the lumbar spine were obtained without and with intravenous contrast. CONTRAST:  269mMULTIHANCE GADOBENATE DIMEGLUMINE 529 MG/ML IV SOLN COMPARISON:  Abdomen pelvis CT obtained today. FINDINGS: Segmentation: 5 non-rib-bearing lumbar vertebrae with the last open disc space at the L5-S1 level. Alignment:  Normal. Vertebrae: Left-sided  laminectomy defects extending from the L4 to the S1 level. No fractures or marrow edema. Conus medullaris: Extends to the mid L2 level and appears normal. Paraspinal and other soft tissues: Unremarkable. Diffuse disc degeneration throughout the lumbar and lower thoracic spine. Disc levels: L1-2: Normal. L2-3: Normal. L3-4: Mild diffuse posterior disc protrusion. Bilateral ligamentum flavum hypertrophy. These changes are producing mild canal stenosis without significant foraminal stenosis. L4-5: Mild to moderate diffuse posterior disc protrusion. Moderate ligamentum flavum hypertrophy on the right. These changes are producing mild-to-moderate foraminal stenosis on the right. The disc bulging is causing mild foraminal stenosis on the left. There is also mild canal stenosis. L5-S1: Minimal diffuse posterior disc protrusion. Mild ligamentum flavum hypertrophy on the right. Mild to moderate facet hypertrophy on the right. These changes are producing mild foraminal stenosis on the right and minimal foraminal stenosis on the left. No significant canal stenosis. IMPRESSION: 1. Left laminectomy defects at the L4 through S1 levels. 2. Mild degenerative changes at the L3-4, L4-5 and L5-S1 levels, as described above. 3. No focal disc protrusions or extrusions and no evidence of neural compression any level. 4. No evidence of tumor recurrence. Electronically Signed   By: StClaudie Revering.D.   On: 05/29/2017 21:46   Ct Abdomen Pelvis W Contrast  Result Date: 05/29/2017 CLINICAL DATA:  Low back and abdominal pain for 2 weeks. EXAM: CT ABDOMEN AND PELVIS WITH CONTRAST TECHNIQUE: Multidetector CT imaging of the abdomen and pelvis was performed using the standard protocol following bolus administration of intravenous contrast. CONTRAST:  10061mSOVUE-300 IOPAMIDOL (ISOVUE-300) INJECTION 61% COMPARISON:  None. FINDINGS: Lower Chest: No acute findings. Hepatobiliary: No hepatic masses identified. Prior  cholecystectomy. Stable mild  biliary ductal dilatation, likely related to prior cholecystectomy. Pancreas:  No mass or inflammatory changes. Spleen: Within normal limits in size and appearance. Adrenals/Urinary Tract: No masses identified. No evidence of hydronephrosis. Stomach/Bowel: No evidence of obstruction, inflammatory process or abnormal fluid collections. Vascular/Lymphatic: No pathologically enlarged lymph nodes. No abdominal aortic aneurysm. Aortic atherosclerosis. Reproductive: 12 mm calcified fibroid in left uterine fundus. Adnexal regions are unremarkable. Other: Small right paraumbilical ventral hernia containing only fat. No evidence of herniated bowel loops. Musculoskeletal:  No suspicious bone lesions identified. IMPRESSION: No acute findings within the abdomen or pelvis. Small calcified uterine fibroid.  That Small right paraumbilical ventral hernia containing only fat. Electronically Signed   By: Earle Gell M.D.   On: 05/29/2017 16:58   Dg Chest Portable 1 View  Result Date: 05/29/2017 CLINICAL DATA:  C/o pain RLQ x 2 days, no chest pain or sob, no cough or congestion, hx. HTN, non smoker EXAM: PORTABLE CHEST 1 VIEW COMPARISON:  Chest radiograph 02/03/2012 FINDINGS: Stable large cardiac silhouette. There is central venous congestion. No effusion, infiltrate or pneumothorax. IMPRESSION: Cardiomegaly central venous congestion.  No clear acute findings. Electronically Signed   By: Suzy Bouchard M.D.   On: 05/29/2017 15:31    ASSESSMENT & PLAN:  SHILAH HEFEL is a delightful, well-appearing 76 y.o. woman who was admitted recently and found to have:   #1 Leukocytosis?predominantly Neutrophilia with some monocytosis Patient presented with a WBC count of 55.2k with 48.8k neutrophils. Unknown chronicity of leukocytosis. Peripheral blood smear personally reviewed-shows significant neutrophilia with left shift.  Increased bands with a few myelocytes and metamyelocytes.  Pelgeroid neutrophils.  No significant  increased blasts.  Monocytosis present.  Patient has significantly elevated CRP levels of 6.3 and a sed rate of 57. Pro-calcitonin level at baseline was not impressively elevated and was at 0.23.  No overt evidence of infection source at this time blood cultures Chest x-ray showed cardiomegaly with central venous congestion with no acute evidence of pneumonia UA. Does not suggest overt UTI. CT abdomen done for workup of her back pain did not show any acute intra-abdominal or pelvic findings. He does not have any overt splenomegaly on her CT abdomen -which is somewhat argues against a chronic myeloproliferative disorder though does not rule this out.  MRI of the lumbar spine shows old surgical and degenerative changes but no evidence of tumor or infectious collections.  CT chest on 05/31/2017: IMPRESSION: 1. Mild ground-glass opacities suggest mild pulmonary edema. 2. Small bilateral pleural effusions and basilar atelectasis. 3. No pulmonary infiltrate. 4. Mild subcarinal mediastinal adenopathy is likely reactive. 5. Cardiomegaly.  JAK2 and BCR-ABL mutations neg. #2 mild normocytic anemia  Plan -I discussed all the clinical findings and available labs thus far with the patient -patient had a workup to rule out an acute infectious process or abscess.  No overt source of infection noted at this time.  -Could consider getting a CT of the chest showed no evidence of tumor/lymphadenopathy. -CT abdomen pelvis did not show any lymphadenopathy or overt sources of infection. -could potentially represent a myeloproliferative process such as CMML especially with the monocytosis though we do not know if this is chronic at this point.  No overt increase in blasts in the peripheral blood. --Other possibility is that this could be a paraneoplastic leukocytosis associated with a lymphoproliferative disorder or a solid tumor.  This is not clinically apparent at this time.  CT chest would rule out presence  of lymphadenopathy  in the chest and other possible tumors to complete this workup.  LDH levels were within normal limits and did not suggest a high-grade lymphoma. -She will need to make sure she continues follow-up with her primary care physician to have age-appropriate and symptom directed cancer screening -Thoroughly explained all exam results  -Discussed bone marrow examination to rule out blood disease and cancer vs monitoring for new symptoms and monitoring blood counts.  -Pt opted to do the BM bx   plan -CT guided bone marrow biopsy in 4-5 days -RTC 5 days after bone marrow biopsy within 10-12 days with labs    Sullivan Lone MD [place] AAHIVMS [facility] Hematology/Oncology Physician [place]  (Office):       [phone] (Work cell):  [phone] (Fax):           [phone]  06/12/2017 12:10 PM  This document serves as a record of services personally performed by Sullivan Lone, MD. It was created on his behalf by Alean Rinne, a trained medical scribe. The creation of this record is based on the scribe's personal observations and the provider's statements to them.   .I have reviewed the above documentation for accuracy and completeness, and I agree with the above. Brunetta Genera MD MS

## 2017-06-12 NOTE — Telephone Encounter (Signed)
Scheduled appt per 11/7 los [facility] radiology to contact patient with ct biopsy schedule - Gave patient AVs and calender per los.

## 2017-06-14 DIAGNOSIS — R600 Localized edema: Secondary | ICD-10-CM | POA: Diagnosis not present

## 2017-06-14 DIAGNOSIS — M79604 Pain in right leg: Secondary | ICD-10-CM | POA: Diagnosis not present

## 2017-06-14 DIAGNOSIS — D72829 Elevated white blood cell count, unspecified: Secondary | ICD-10-CM | POA: Diagnosis not present

## 2017-06-20 ENCOUNTER — Ambulatory Visit (HOSPITAL_COMMUNITY): Payer: PPO

## 2017-06-21 ENCOUNTER — Other Ambulatory Visit: Payer: Self-pay | Admitting: General Surgery

## 2017-06-21 ENCOUNTER — Other Ambulatory Visit: Payer: Self-pay | Admitting: Radiology

## 2017-06-24 ENCOUNTER — Encounter (HOSPITAL_COMMUNITY): Payer: Self-pay

## 2017-06-24 ENCOUNTER — Ambulatory Visit (HOSPITAL_COMMUNITY)
Admission: RE | Admit: 2017-06-24 | Discharge: 2017-06-24 | Disposition: A | Discharge status: Home / Self Care | Payer: PPO | Source: Ambulatory Visit | Attending: Hematology | Admitting: Hematology

## 2017-06-24 DIAGNOSIS — D72829 Elevated white blood cell count, unspecified: Secondary | ICD-10-CM | POA: Diagnosis not present

## 2017-06-24 DIAGNOSIS — Z7951 Long term (current) use of inhaled steroids: Secondary | ICD-10-CM | POA: Diagnosis not present

## 2017-06-24 DIAGNOSIS — D7589 Other specified diseases of blood and blood-forming organs: Secondary | ICD-10-CM | POA: Diagnosis not present

## 2017-06-24 DIAGNOSIS — I10 Essential (primary) hypertension: Secondary | ICD-10-CM | POA: Diagnosis not present

## 2017-06-24 DIAGNOSIS — J45909 Unspecified asthma, uncomplicated: Secondary | ICD-10-CM | POA: Insufficient documentation

## 2017-06-24 DIAGNOSIS — E669 Obesity, unspecified: Secondary | ICD-10-CM | POA: Diagnosis not present

## 2017-06-24 DIAGNOSIS — M5136 Other intervertebral disc degeneration, lumbar region: Secondary | ICD-10-CM | POA: Diagnosis not present

## 2017-06-24 DIAGNOSIS — D649 Anemia, unspecified: Secondary | ICD-10-CM | POA: Diagnosis not present

## 2017-06-24 DIAGNOSIS — Z6841 Body Mass Index (BMI) 40.0 and over, adult: Secondary | ICD-10-CM | POA: Insufficient documentation

## 2017-06-24 DIAGNOSIS — D72821 Monocytosis (symptomatic): Secondary | ICD-10-CM

## 2017-06-24 DIAGNOSIS — Z8611 Personal history of tuberculosis: Secondary | ICD-10-CM | POA: Insufficient documentation

## 2017-06-24 DIAGNOSIS — Z79891 Long term (current) use of opiate analgesic: Secondary | ICD-10-CM | POA: Diagnosis not present

## 2017-06-24 DIAGNOSIS — Z7982 Long term (current) use of aspirin: Secondary | ICD-10-CM | POA: Insufficient documentation

## 2017-06-24 DIAGNOSIS — Z79899 Other long term (current) drug therapy: Secondary | ICD-10-CM | POA: Diagnosis not present

## 2017-06-24 LAB — CBC WITH DIFFERENTIAL/PLATELET
BASOS PCT: 0 %
Basophils Absolute: 0 10*3/uL (ref 0.0–0.1)
EOS ABS: 0.8 10*3/uL — AB (ref 0.0–0.7)
EOS PCT: 2 %
HCT: 34.6 % — ABNORMAL LOW (ref 36.0–46.0)
HEMOGLOBIN: 11.3 g/dL — AB (ref 12.0–15.0)
LYMPHS PCT: 7 %
Lymphs Abs: 2.7 10*3/uL (ref 0.7–4.0)
MCH: 30.9 pg (ref 26.0–34.0)
MCHC: 32.7 g/dL (ref 30.0–36.0)
MCV: 94.5 fL (ref 78.0–100.0)
Monocytes Absolute: 1.6 10*3/uL — ABNORMAL HIGH (ref 0.1–1.0)
Monocytes Relative: 4 %
NEUTROS PCT: 87 %
Neutro Abs: 34.1 10*3/uL — ABNORMAL HIGH (ref 1.7–7.7)
PLATELETS: 171 10*3/uL (ref 150–400)
RBC: 3.66 MIL/uL — AB (ref 3.87–5.11)
RDW: 15.6 % — AB (ref 11.5–15.5)
WBC: 39.2 10*3/uL — AB (ref 4.0–10.5)

## 2017-06-24 LAB — PROTIME-INR
INR: 1.01
PROTHROMBIN TIME: 13.2 s (ref 11.4–15.2)

## 2017-06-24 MED ORDER — FLUMAZENIL 0.5 MG/5ML IV SOLN
INTRAVENOUS | Status: AC
  Filled 2017-06-24: qty 5

## 2017-06-24 MED ORDER — NALOXONE HCL 0.4 MG/ML IJ SOLN
INTRAMUSCULAR | Status: AC
  Filled 2017-06-24: qty 1

## 2017-06-24 MED ORDER — FENTANYL CITRATE (PF) 100 MCG/2ML IJ SOLN
INTRAMUSCULAR | Status: AC | PRN
  Administered 2017-06-24 (×4): 50 ug via INTRAVENOUS

## 2017-06-24 MED ORDER — SODIUM CHLORIDE 0.9 % IV SOLN
INTRAVENOUS | Status: AC
  Filled 2017-06-24: qty 250

## 2017-06-24 MED ORDER — HYDROCODONE-ACETAMINOPHEN 5-325 MG PO TABS: 1.0000 | ORAL_TABLET | ORAL | Status: DC | PRN

## 2017-06-24 MED ORDER — FENTANYL CITRATE (PF) 100 MCG/2ML IJ SOLN
INTRAMUSCULAR | Status: AC
  Filled 2017-06-24: qty 4

## 2017-06-24 MED ORDER — SODIUM CHLORIDE 0.9 % IV SOLN
INTRAVENOUS | Status: DC
  Administered 2017-06-24: 08:00:00 via INTRAVENOUS

## 2017-06-24 MED ORDER — MIDAZOLAM HCL 2 MG/2ML IJ SOLN
INTRAMUSCULAR | Status: AC | PRN
  Administered 2017-06-24 (×4): 1 mg via INTRAVENOUS

## 2017-06-24 MED ORDER — MIDAZOLAM HCL 2 MG/2ML IJ SOLN
INTRAMUSCULAR | Status: AC
  Filled 2017-06-24: qty 4

## 2017-06-24 NOTE — Sedation Documentation (Signed)
Patient denies pain and is resting comfortably.  

## 2017-06-24 NOTE — Sedation Documentation (Signed)
Patient safely  Transported to other CT room.

## 2017-06-24 NOTE — H&P (Signed)
Chief Complaint: Patient was seen in consultation today for leukocytosis  Referring Physician(s): Brunetta Genera  Supervising Physician: Markus Daft  Patient Status: Erika [name] - Out-pt  History of Present Illness: Erika [name] is a 76 y.o. female with past medical history of anemia, asthma, back pain, HTN, TB in 2002 presented to [facility] ED in October with worsening back pain. Although imaging at that time showed progressive degenerative disc disease but no evidence of tumor or lymphadenopathy her labwork obtained at the time demonstrated leukocytosis/neutrophilia.  She has been undergoing evaluation for the source of her leukocytosis.   IR consulted for bone marrow biopsy at the request of Dr. Irene Limbo.  Patient presents today in her usual state of [name].  She has been NPO.    Past Medical History:  Diagnosis Date  . Anemia    with menses  . Asthma   . Back pain    status post surgery 2002  . Breast cyst    Excesion with FNA, begnin in 2004.   . Degenerative joint disease of spine    Imaging 2005,  Degenerative hypertrophic facet arthritis changes L4-5 and L5-S1..   . History of shingles    Recurrent with post herpetic neuralgia.   [name] Hypertension   . Lymphadenopathy    Of the mediastinum, Right side CXR 2008, not read on 2010 cxr.   . Menopause   . Obesity    BMI 54  . Psychosis ([place])    Secondary to prednisone.  . Shingles   . Stasis dermatitis    W/ LE edema, prviously on lasix now on mazxide.   . SVT (supraventricular tachycardia) [facility]) June 2009   one run while hospitalized  . Tuberculosis    active TB treated in 2002, hx of paraspinal lumbar TB,     Past Surgical History:  Procedure Laterality Date  . BREAST CYST ASPIRATION Left   . Breast cyst biopsy    . Hemilaminectomy of L4, L5 and S1 decompression of tumor in epidural space  2000  . LAPAROSCOPIC CHOLECYSTECTOMY N/A 02/02/2012   Performed by Zenovia Jarred, MD at [facility] OR     Allergies: Prednisone  Medications: Prior to Admission medications   Medication Sig Start Date End Date Taking? Authorizing Provider  albuterol (PROAIR HFA) 108 (90 Base) MCG/ACT inhaler inhale 1 to 2 puffs every 6 hours if needed 03/20/17  Yes [name], MD  apixaban (ELIQUIS) 5 MG TABS tablet Take 1 tablet (5 mg total) by mouth 2 (two) times daily. 06/03/17  Yes Hosie Poisson, MD  aspirin EC 81 MG tablet Take 81 mg by mouth daily.   Yes [provider]  Fluticasone-Salmeterol (ADVAIR DISKUS) 500-50 MCG/DOSE AEPB INHALE 1 PUFF BY MOUTH TWICE A DAY 03/20/17  Yes [name], MD  furosemide (LASIX) 20 MG tablet take 1 tablet by mouth once daily if needed for SWELLING IN LEGS OR FEET 03/26/17  Yes [name], MD  guaiFENesin-dextromethorphan (ROBITUSSIN DM) 100-10 MG/5ML syrup Take 10 mLs by mouth every 4 (four) hours as needed for cough. 06/03/17  Yes Hosie Poisson, MD  lisinopril (PRINIVIL,ZESTRIL) 5 MG tablet Take 1 tablet (5 mg total) by mouth daily. 11/27/16 11/27/17 Yes [name], MD  metoprolol tartrate (LOPRESSOR) 25 MG tablet Take 0.5 tablets (12.5 mg total) by mouth 2 (two) times daily. 06/03/17  Yes Hosie Poisson, MD  omeprazole (PRILOSEC) 20 MG capsule Take 1 capsule (20 mg total) by mouth daily. 03/20/17  Yes Gilles Chiquito  B, MD  potassium chloride SA (K-DUR,KLOR-CON) 20 MEQ tablet Take 0.5 tablets (10 mEq total) by mouth daily. 03/20/17  Yes [name], MD  pravastatin (PRAVACHOL) 20 MG tablet Take 1 tablet (20 mg total) by mouth daily. 03/20/17  Yes [name], MD  traMADol (ULTRAM) 50 MG tablet Take 1 tablet (50 mg total) by mouth every 12 (twelve) hours as needed. 06/03/17  Yes Hosie Poisson, MD     Family History  Problem Relation Age of Onset  . Stroke Mother        at young age  . Stroke Father        in 12's  . Heart attack Father   . Stroke Brother   . Stroke Brother   . Breast cancer Other     Social History   Socioeconomic  History  . Marital status: Widowed    Spouse name: None  . Number of children: None  . Years of education: None  . Highest education level: None  Social Needs  . Financial resource strain: None  . Food insecurity - worry: None  . Food insecurity - inability: None  . Transportation needs - medical: None  . Transportation needs - non-medical: None  Occupational History  . 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 Smoking status: Never Smoker  . Smokeless tobacco: Never Used  Substance and Sexual Activity  . Alcohol use: No    Alcohol/week: 0.0 oz  . Drug use: No  . Sexual activity: No    Birth control/protection: Post-menopausal  Other Topics Concern  . None  Social History Narrative   No regular exercise, retired, single, volunteers at a local school.     Review of Systems  Constitutional: Negative for fatigue and fever.  Respiratory: Negative for cough and shortness of breath.   Cardiovascular: Negative for chest pain.  Gastrointestinal: Negative for abdominal pain.  Psychiatric/Behavioral: Negative for behavioral problems and confusion.    Vital Signs: BP (!) 166/102   Pulse 78   Temp 98.3 F (36.8 C) (Oral)   Resp 18   Ht 5' 8"  (1.727 m)   Wt 297 lb (134.7 kg)   SpO2 98%   BMI 45.16 kg/m   Physical Exam  Constitutional: She is oriented to person, place, and time. She appears well-developed.  Cardiovascular: Normal rate, regular rhythm and normal heart sounds.  Pulmonary/Chest: Effort normal and breath sounds normal. No respiratory distress.  Abdominal: Soft.  Neurological: She is alert and oriented to person, place, and time.  Skin: Skin is warm and dry.  Psychiatric: She has a normal mood and affect. Her behavior is normal. Judgment and thought content normal.  Nursing note and vitals reviewed.   Imaging: Ct Chest W Contrast  Result Date: 05/31/2017 CLINICAL DATA:  Leukocytosis.  Back  pain.  Muscle spasms. EXAM: CT CHEST WITH CONTRAST TECHNIQUE: Multidetector CT imaging of the chest was performed during intravenous contrast administration. CONTRAST:  72m ISOVUE-300 IOPAMIDOL (ISOVUE-300) INJECTION 61% COMPARISON:  05/29/2017 FINDINGS: Cardiovascular: No significant vascular findings. Normal heart size. No pericardial effusion. Mediastinum/Nodes: No axillary supraclavicular adenopathy. No mediastinal hilar adenopathy. Subcarinal lymph node is enlarged measuring 22 mm short access Lungs/Pleura: Small bilateral effusions. No suspicious pulmonary nodules. Diffuse ground-glass opacities suggesting pulmonary edema. Upper Abdomen: Limited view of the liver, kidneys, pancreas are unremarkable. Normal adrenal glands. Musculoskeletal: No aggressive osseous lesion IMPRESSION: 1. Mild ground-glass opacities suggest mild pulmonary edema. 2.  Small bilateral pleural effusions and basilar atelectasis. 3. No pulmonary infiltrate. 4. Mild subcarinal mediastinal adenopathy is likely reactive. 5. Cardiomegaly. Electronically Signed   By: Suzy Bouchard M.D.   On: 05/31/2017 15:43   Mr Lumbar Spine W Wo Contrast  Result Date: 05/29/2017 CLINICAL DATA:  Back pain for the past 2 days. 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 EXAM: MRI LUMBAR SPINE WITHOUT AND WITH CONTRAST TECHNIQUE: Multiplanar and multiecho pulse sequences of the lumbar spine were obtained without and with intravenous contrast. CONTRAST:  40m MULTIHANCE GADOBENATE DIMEGLUMINE 529 MG/ML IV SOLN COMPARISON:  Abdomen pelvis CT obtained today. FINDINGS: Segmentation: 5 non-rib-bearing lumbar vertebrae with the last open disc space at the L5-S1 level. Alignment:  Normal. Vertebrae: Left-sided laminectomy defects extending from the L4 to the S1 level. No fractures or marrow edema. Conus medullaris: Extends to the mid L2 level and appears normal. Paraspinal and other soft tissues:  Unremarkable. Diffuse disc degeneration throughout the lumbar and lower thoracic spine. Disc levels: L1-2: Normal. L2-3: Normal. L3-4: Mild diffuse posterior disc protrusion. Bilateral ligamentum flavum hypertrophy. These changes are producing mild canal stenosis without significant foraminal stenosis. L4-5: Mild to moderate diffuse posterior disc protrusion. Moderate ligamentum flavum hypertrophy on the right. These changes are producing mild-to-moderate foraminal stenosis on the right. The disc bulging is causing mild foraminal stenosis on the left. There is also mild canal stenosis. L5-S1: Minimal diffuse posterior disc protrusion. Mild ligamentum flavum hypertrophy on the right. Mild to moderate facet hypertrophy on the right. These changes are producing mild foraminal stenosis on the right and minimal foraminal stenosis on the left. No significant canal stenosis. IMPRESSION: 1. Left laminectomy defects at the L4 through S1 levels. 2. Mild degenerative changes at the L3-4, L4-5 and L5-S1 levels, as described above. 3. No focal disc protrusions or extrusions and no evidence of neural compression any level. 4. No evidence of tumor recurrence. Electronically Signed   By: SClaudie ReveringM.D.   On: 05/29/2017 21:46   Ct Abdomen Pelvis W Contrast  Result Date: 05/29/2017 CLINICAL DATA:  Low back and abdominal pain for 2 weeks. EXAM: CT ABDOMEN AND PELVIS WITH CONTRAST TECHNIQUE: Multidetector CT imaging of the abdomen and pelvis was performed using the standard protocol following bolus administration of intravenous contrast. CONTRAST:  1021mISOVUE-300 IOPAMIDOL (ISOVUE-300) INJECTION 61% COMPARISON:  None. FINDINGS: Lower Chest: No acute findings. Hepatobiliary: No hepatic masses identified. Prior cholecystectomy. Stable mild biliary ductal dilatation, likely related to prior cholecystectomy. Pancreas:  No mass or inflammatory changes. Spleen: Within normal limits in size and appearance. Adrenals/Urinary Tract: No  masses identified. No evidence of hydronephrosis. Stomach/Bowel: No evidence of obstruction, inflammatory process or abnormal fluid collections. Vascular/Lymphatic: No pathologically enlarged lymph nodes. No abdominal aortic aneurysm. Aortic atherosclerosis. Reproductive: 12 mm calcified fibroid in left uterine fundus. Adnexal regions are unremarkable. Other: Small right paraumbilical ventral hernia containing only fat. No evidence of herniated bowel loops. Musculoskeletal:  No suspicious bone lesions identified. IMPRESSION: No acute findings within the abdomen or pelvis. Small calcified uterine fibroid.  That Small right paraumbilical ventral hernia containing only fat. Electronically Signed   By: JoEarle Gell.D.   On: 05/29/2017 16:58   Dg Chest Portable 1 View  Result Date: 05/29/2017 CLINICAL DATA:  C/o pain RLQ x 2 days, no chest pain or sob, no cough or congestion, hx. HTN, non smoker EXAM: PORTABLE CHEST 1 VIEW COMPARISON:  Chest radiograph 02/03/2012 FINDINGS: Stable  large cardiac silhouette. There is central venous congestion. No effusion, infiltrate or pneumothorax. IMPRESSION: Cardiomegaly central venous congestion.  No clear acute findings. Electronically Signed   By: Suzy Bouchard M.D.   On: 05/29/2017 15:31    Labs:  CBC: Recent Labs    05/31/17 0450 06/01/17 0509 06/12/17 1439 06/24/17 0732  WBC 47.0* 54.0* 42.4* 39.2*  HGB 10.4* 10.1* 10.7* 11.3*  HCT 31.6* 30.8* 33.4* 34.6*  PLT 165 185 214 171    COAGS: Recent Labs    06/24/17 0732  INR 1.01    BMP: Recent Labs    05/30/17 0439 05/31/17 0450 06/01/17 0509 06/02/17 1202 06/12/17 1439  NA 137 138 141 137 140  K 3.7 3.6 3.8 4.0 4.4  CL 103 104 104 101  --   CO2 25 24 28 26 26   GLUCOSE 79 84 92 97 99  BUN 16 11 8 12  10.8  CALCIUM 8.9 8.7* 9.1 9.0 9.3  CREATININE 0.96 0.85 0.80 0.90 1.0  GFRNONAA 57* >60 >60 >60  --   GFRAA >60 >60 >60 >60  --     LIVER FUNCTION TESTS: Recent Labs    05/29/17 1416  05/30/17 0439 06/12/17 1439  BILITOT 0.6 0.5 0.30  AST 17 21 14   ALT 11* 10* 13  ALKPHOS 161* 182* 202*  PROT 8.0 7.3 7.7  ALBUMIN 3.6 3.2* 3.2*    TUMOR MARKERS: No results for input(s): AFPTM, CEA, CA199, CHROMGRNA in the last 8760 hours.  Assessment and Plan: Patient with past medical history of HTN, asthma, anemia, TB in 2002 presents with complaint of weakness and leukocytosis.  IR consulted for bone marrow biopsy at the request of Dr. Irene Limbo. Patient presents today in their usual state of [name].  She has been NPO.  Risks and benefits discussed with the patient including, but not limited to bleeding, infection, damage to adjacent structures or low yield requiring additional tests. All of the patient's questions were answered, patient is agreeable to proceed. Consent signed and in chart.   Thank you for this interesting consult.  I greatly enjoyed meeting ADYLYNN HERTENSTEIN and look forward to participating in their care.  A copy of this report was sent to the requesting provider on this date.  Electronically Signed: Docia Barrier, PA 06/24/2017, 8:53 AM   I spent a total of  30 Minutes   in face to face in clinical consultation, greater than 50% of which was counseling/coordinating care for leukocytosis.

## 2017-06-24 NOTE — Procedures (Signed)
CT guided bone marrow biopsy.  2 aspirates and 1 core obtained from right ilium.   Minimal blood loss and no immediate complication.

## 2017-06-24 NOTE — Discharge Instructions (Signed)
Bone Marrow Aspiration and Bone Marrow Biopsy, Adult, Care After This sheet gives you information about how to care for yourself after your procedure. Your health care provider may also give you more specific instructions. If you have problems or questions, contact your health care provider. What can I expect after the procedure? After the procedure, it is common to have:  Mild pain and tenderness.  Swelling.  Bruising.  Follow these instructions at home:  Take over-the-counter or prescription medicines only as told by your health care provider.  Do not take baths, swim, or use a hot tub until your health care provider approves. Ask if you can take a shower or have a sponge bath.  You may shower tomorrow. 06/25/17.  Follow instructions from your health care provider about how to take care of the puncture site. Make sure you: ? Wash your hands with soap and water before you change your bandage (dressing). If soap and water are not available, use hand sanitizer. ? Change your dressing as told by your health care provider.  You may remove dressing tomorrow. 06/25/17  Check your puncture siteevery day for signs of infection. Check for: ? More redness, swelling, or pain. ? More fluid or blood. ? Warmth. ? Pus or a bad smell.  Return to your normal activities as told by your health care provider. Ask your health care provider what activities are safe for you.  Do not drive for 24 hours if you were given a medicine to help you relax (sedative).  Keep all follow-up visits as told by your health care provider. This is important. Contact a health care provider if:  You have more redness, swelling, or pain around the puncture site.  You have more fluid or blood coming from the puncture site.  Your puncture site feels warm to the touch.  You have pus or a bad smell coming from the puncture site.  You have a fever.  Your pain is not controlled with medicine. This information is not  intended to replace advice given to you by your health care provider. Make sure you discuss any questions you have with your health care provider. Document Released: 02/09/2005 Document Revised: 02/10/2016 Document Reviewed: 01/04/2016 Elsevier Interactive Patient Education  2018 [place]. Moderate Conscious Sedation, Adult, Care After These instructions provide you with information about caring for yourself after your procedure. Your health care provider may also give you more specific instructions. Your treatment has been planned according to current medical practices, but problems sometimes occur. Call your health care provider if you have any problems or questions after your procedure. What can I expect after the procedure? After your procedure, it is common:  To feel sleepy for several hours.  To feel clumsy and have poor balance for several hours.  To have poor judgment for several hours.  To vomit if you eat too soon.  Follow these instructions at home: For at least 24 hours after the procedure:   Do not: ? Participate in activities where you could fall or become injured. ? Drive. ? Use heavy machinery. ? Drink alcohol. ? Take sleeping pills or medicines that cause drowsiness. ? Make important decisions or sign legal documents. ? Take care of children on your own.  Rest. Eating and drinking  Follow the diet recommended by your health care provider.  If you vomit: ? Drink water, juice, or soup when you can drink without vomiting. ? Make sure you have little or no nausea before eating solid foods.  General instructions  Have a responsible adult stay with you until you are awake and alert.  Take over-the-counter and prescription medicines only as told by your health care provider.  If you smoke, do not smoke without supervision.  Keep all follow-up visits as told by your health care provider. This is important. Contact a health care provider if:  You keep  feeling nauseous or you keep vomiting.  You feel light-headed.  You develop a rash.  You have a fever. Get help right away if:  You have trouble breathing. This information is not intended to replace advice given to you by your health care provider. Make sure you discuss any questions you have with your health care provider. Document Released: 05/13/2013 Document Revised: 12/26/2015 Document Reviewed: 11/12/2015 Elsevier Interactive Patient Education  2018 [place].   Sedation medication Fentanyl Versed

## 2017-06-24 NOTE — Progress Notes (Signed)
HEMATOLOGY/ONCOLOGY CLINIC NOTE  Date of Service: 06/26/2017  Inpatient Attending: .Brunetta Genera, MD  CC: leucocytosis  HPI   Patient is a very pleasant 76 year old female with a history of morbid obesity, anemia, asthma, hypertension, lumbar spinal TB in 2002 who presented with acute onset of lower back pain on Monday. She reports that she drank some pepsi and tea on that day and her back pain began shortly after. No initial injury to precipitate her back pain. It gradually worsened until she was seen in the ED on 05/30/17. Labs during admission revealed WBC count of 55.2k with predominantly neutrophilia of 48.8k and some monocytosis at 2.2k.   She had minimal anemia at 11.5 with an MCV of 92 and a normal platelet count of 163k.  At that time she had CT A/P while in the ED which was normal and without splenomegaly. Additionally, she also had MRI of the L-spine which only showed degenerative changes. She was subsequently admitted for further workup and pain control and to rule out a source of infection.   Of note, the patient has a remote prior h/o epidural mass in 2002 (hemilaminectomy and decompression of tumor in epidural space 2002) which was thought to be secondary to lumbar epidural tuberculosis. She was placed on a course of antibiotics for several months following her diagnosis.   Following her admission, we were consulted for further workup. No recent new medications. She reports that her inhaler does contain steroids, but otherwise she has not been on a course of steroids recently. She is UTD with her regular cancer screenings. She does not feel any different now than she did 64moto a year ago.  No focal symptoms suggestive of infection other than the back pain at this time.  On ROS, she denies fever, chills, night sweats, unexpected weight loss, abdominal pain, sore throat, joint swelling, or any other associated symptoms. Her back pain is much improved with muscle  relaxants. She does note some right great toe pain, but otherwise she has no other acute symptoms.   OBJECTIVE:  Erika MANGANELLIis her for f/u of persistent leucocytosis/neutrophilia. Of note since pt last visit, she underwent a CT BM bx on 06/24/2017. I discussed the BM Bx results with Dr SMonica Martinez Noted to have hypercellular Bone marrow with granulocytic proliferation - cannot r/o MPN. Jak2 and BCR-ABL mutation testing negative. Cytogenetics pending Peripheral blood heme [facility] testing sent out -- concern for atypical CML -- CSF3R mutation included.   Labs during that time showed hgb 11.3. She states that she is doing well overall and reports no acute new concerns.    On ROS, pt reports mild back pain and denies fever, chills, night sweats and any other accompanying symptoms.   NAD  PHYSICAL EXAMINATION: . Vitals:   06/26/17 0958  BP: (!) 149/74  Pulse: 93  Resp: 18  Temp: 98 F (36.7 C)  TempSrc: Oral  SpO2: 99%  Weight: 297 lb 8 oz (134.9 kg)  Height: _0  (1.727 m)   Filed Weights   06/26/17 0958  Weight: 297 lb 8 oz (134.9 kg)   .Body mass index is 45.23 kg/m.  GENERAL:alert, in no acute distress and comfortable SKIN: skin color, texture, turgor are normal, no rashes or significant lesions EYES: normal, conjunctiva are pink and non-injected, sclera clear OROPHARYNX:no exudate, no erythema and lips, buccal mucosa, and tongue normal  NECK: supple, no JVD, thyroid normal size, non-tender, without nodularity LYMPH:  no apparent palpable lymphadenopathy in  the cervical, axillary or inguinal LUNGS: clear to auscultation with normal respiratory effort HEART: regular rate & rhythm,  no murmurs and no lower extremity edema ABDOMEN: abdomen obese soft, non-tender,distant bowel sounds , no overly palpable hepato-splenomegaly Musculoskeletal: no cyanosis of digits and no clubbing  PSYCH: alert & oriented x 3 with fluent speech NEURO: no focal motor/sensory  deficits  MEDICAL HISTORY:  Past Medical History:  Diagnosis Date   Anemia    with menses   Asthma    Back pain    status post surgery 2002   Breast cyst    Excesion with FNA, begnin in 2004.    Degenerative joint disease of spine    Imaging 2005,  Degenerative hypertrophic facet arthritis changes L4-5 and L5-S1.[name]    History of shingles    Recurrent with post herpetic neuralgia.    Hypertension    Lymphadenopathy    Of the mediastinum, Right side CXR 2008, not read on 2010 cxr.    Menopause    Obesity    BMI 54   Psychosis ([place])    Secondary to prednisone.   Shingles    Stasis dermatitis    W/ LE edema, prviously on lasix now on mazxide.    SVT (supraventricular tachycardia) ([place]) June 2009   one run while hospitalized   Tuberculosis    active TB treated in 2002, hx of paraspinal lumbar TB,     SURGICAL HISTORY: Past Surgical History:  Procedure Laterality Date   BREAST CYST ASPIRATION Left    Breast cyst biopsy     CHOLECYSTECTOMY  02/02/2012   Procedure: LAPAROSCOPIC CHOLECYSTECTOMY;  Surgeon: Zenovia Jarred, MD;  Location: [facility];  Service: General;  Laterality: N/A;   Hemilaminectomy of L4, L5 and S1 decompression of tumor in epidural space  2000    SOCIAL HISTORY: Social History   Socioeconomic History   Marital status: Widowed    Spouse name: Not on file   Number of children: Not on file   Years of education: Not on file   Highest education level: Not on file  Social Needs   Financial resource strain: Not on file   Food insecurity - worry: Not on file   Food insecurity - inability: Not on file   Transportation needs - medical: Not on file   Transportation needs - non-medical: Not on file  Occupational History   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     FAMILY HISTORY: Family History  Problem Relation Age of Onset   Stroke Mother        at young age   Stroke Father        in 68's   Heart attack Father    Stroke Brother    Stroke Brother    Breast cancer Other     ALLERGIES:  is allergic to prednisone.  MEDICATIONS:  . Current Outpatient Medications:    albuterol (PROAIR HFA) 108 (90 Base) MCG/ACT inhaler, inhale 1 to 2 puffs every 6 hours if needed, Disp: 8.5 g,  Rfl: 6   apixaban (ELIQUIS) 5 MG TABS tablet, Take 1 tablet (5 mg total) by mouth 2 (two) times daily., Disp: 60 tablet, Rfl: 0   aspirin EC 81 MG tablet, Take 81 mg by mouth daily., Disp: , Rfl:    Fluticasone-Salmeterol (ADVAIR DISKUS) 500-50 MCG/DOSE AEPB, INHALE 1 PUFF BY MOUTH TWICE A DAY, Disp: 180 each, Rfl: 3   furosemide (LASIX) 20 MG tablet, take 1 tablet by mouth once daily if needed for SWELLING IN LEGS OR FEET, Disp: 30 tablet, Rfl: 3   guaiFENesin-dextromethorphan (ROBITUSSIN DM) 100-10 MG/5ML syrup, Take 10 mLs by mouth every 4 (four) hours as needed for cough., Disp: 118 mL, Rfl: 0   lisinopril (PRINIVIL,ZESTRIL) 5 MG tablet, Take 1 tablet (5 mg total) by mouth daily., Disp: 30 tablet, Rfl: 6   metoprolol tartrate (LOPRESSOR) 25 MG tablet, Take 0.5 tablets (12.5 mg total) by mouth 2 (two) times daily., Disp: 60 tablet, Rfl: 0   omeprazole (PRILOSEC) 20 MG capsule, Take 1 capsule (20 mg total) by mouth daily., Disp: 90 capsule, Rfl: 1   potassium chloride SA (K-DUR,KLOR-CON) 20 MEQ tablet, Take 0.5 tablets (10 mEq total) by mouth daily., Disp: 90 tablet, Rfl: 1   pravastatin (PRAVACHOL) 20 MG tablet, Take 1 tablet (20 mg total) by mouth daily., Disp: 90 tablet, Rfl: 3   traMADol (ULTRAM)  50 MG tablet, Take 1 tablet (50 mg total) by mouth every 12 (twelve) hours as needed., Disp: 6 tablet, Rfl: 0   REVIEW OF SYSTEMS:    10 Point review of Systems was done is negative except as noted above.   LABORATORY DATA:  I have reviewed the data as listed   .      CBC Latest Ref Rng & Units 06/26/2017 06/24/2017 06/12/2017  WBC 3.9 - 10.3 10e3/uL 43.0(H) 39.2(H) 42.4(H)  Hemoglobin 11.6 - 15.9 g/dL 11.4(L) 11.3(L) 10.7(L)  Hematocrit 34.8 - 46.6 % 35.8 34.6(L) 33.4(L)  Platelets 145 - 400 10e3/uL 154 171 214   . CBC    Component Value Date/Time   WBC 43.0 (H) 06/26/2017 0935   WBC 39.2 (H) 06/24/2017 0732   RBC 3.74 06/26/2017 0935   RBC 3.66 (L) 06/24/2017 0732   HGB 11.4 (L) 06/26/2017 0935   HCT 35.8 06/26/2017 0935   PLT 154 06/26/2017 0935   MCV 95.7 06/26/2017 0935   MCH 30.5 06/26/2017 0935   MCH 30.9 06/24/2017 0732   MCHC 31.8 06/26/2017 0935   MCHC 32.7 06/24/2017 0732   RDW 15.6 (H) 06/26/2017 0935   LYMPHSABS 2.5 06/26/2017 0935   MONOABS 1.8 (H) 06/26/2017 0935   EOSABS 0.9 (H) 06/26/2017 0935   BASOSABS 0.1 06/26/2017 0935   . CMP Latest Ref Rng & Units 06/12/2017 06/02/2017 06/01/2017  Glucose 70 - 140 mg/dl 99 97 92  BUN 7.0 - 26.0 mg/dL 10._0 Creatinine 0.6 - 1.1 mg/dL 1.0 0.90 0.80  Sodium 136 - 145 mEq/L 140 137 141  Potassium 3.5 - 5.1 mEq/L 4.4 4.0 3.8  Chloride 101 - 111 mmol/L - 101 104  CO2 22 - 29 mEq/L _1 Calcium 8.4 - 10.4 mg/dL 9.3 9.0 9.1  Total Protein 6.4 - 8.3 g/dL 7.7 - -  Total Bilirubin 0.20 - 1.20 mg/dL 0.30 - -  Alkaline Phos 40 - 150 U/L 202(H) - -  AST 5 - 34 U/L 14 - -  ALT 0 - 55 U/L 13 - -      RADIOGRAPHIC STUDIES:  I have personally reviewed the radiological images as listed and agreed with the findings in the report. Ct Chest W Contrast  Result Date: 05/31/2017 CLINICAL DATA:  Leukocytosis.  Back pain.  Muscle spasms. EXAM: CT CHEST WITH CONTRAST TECHNIQUE: Multidetector CT imaging of the  chest was performed during intravenous contrast administration. CONTRAST:  31m ISOVUE-300 IOPAMIDOL (ISOVUE-300) INJECTION 61% COMPARISON:  05/29/2017 FINDINGS: Cardiovascular: No significant vascular findings. Normal heart size. No pericardial effusion. Mediastinum/Nodes: No axillary supraclavicular adenopathy. No mediastinal hilar adenopathy. Subcarinal lymph node is enlarged measuring 22 mm short access Lungs/Pleura: Small bilateral effusions. No suspicious pulmonary nodules. Diffuse ground-glass opacities suggesting pulmonary edema. Upper Abdomen: Limited view of the liver, kidneys, pancreas are unremarkable. Normal adrenal glands. Musculoskeletal: No aggressive osseous lesion IMPRESSION: 1. Mild ground-glass opacities suggest mild pulmonary edema. 2. Small bilateral pleural effusions and basilar atelectasis. 3. No pulmonary infiltrate. 4. Mild subcarinal mediastinal adenopathy is likely reactive. 5. Cardiomegaly. Electronically Signed   By: SSuzy BouchardM.D.   On: 05/31/2017 15:43   Mr Lumbar Spine W Wo Contrast  Result Date: 05/29/2017 CLINICAL DATA:  Back pain for the past 2 days. 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 EXAM: MRI LUMBAR SPINE WITHOUT AND WITH CONTRAST TECHNIQUE: Multiplanar and multiecho pulse sequences of the lumbar spine were obtained without and with intravenous contrast. CONTRAST:  286mMULTIHANCE GADOBENATE DIMEGLUMINE 529 MG/ML IV SOLN COMPARISON:  Abdomen pelvis CT obtained today. FINDINGS: Segmentation: 5 non-rib-bearing lumbar vertebrae with the last open disc space at the L5-S1 level. Alignment:  Normal. Vertebrae: Left-sided laminectomy defects extending from the L4 to the S1 level. No fractures or marrow edema. Conus medullaris: Extends to the mid L2 level and appears normal. Paraspinal and other soft tissues: Unremarkable. Diffuse disc degeneration throughout the lumbar and lower thoracic spine. Disc levels:  L1-2: Normal. L2-3: Normal. L3-4: Mild diffuse posterior disc protrusion. Bilateral ligamentum flavum hypertrophy. These changes are producing mild canal stenosis without significant foraminal stenosis. L4-5: Mild to moderate diffuse posterior disc protrusion. Moderate ligamentum flavum hypertrophy on the right. These changes are producing mild-to-moderate foraminal stenosis on the right. The disc bulging is causing mild foraminal stenosis on the left. There is also mild canal stenosis. L5-S1: Minimal diffuse posterior disc protrusion. Mild ligamentum flavum hypertrophy on the right. Mild to moderate facet hypertrophy on the right. These changes are producing mild foraminal stenosis on the right and minimal foraminal stenosis on the left. No significant canal stenosis. IMPRESSION: 1. Left laminectomy defects at the L4 through S1 levels. 2. Mild degenerative changes at the L3-4, L4-5 and L5-S1 levels, as described above. 3. No focal disc protrusions or extrusions and no evidence of neural compression any level. 4. No evidence of tumor recurrence. Electronically Signed   By: StClaudie Revering.D.   On: 05/29/2017 21:46   Ct Abdomen Pelvis W Contrast  Result Date: 05/29/2017 CLINICAL DATA:  Low back and abdominal pain for 2 weeks. EXAM: CT ABDOMEN AND PELVIS WITH CONTRAST TECHNIQUE: Multidetector CT imaging of the abdomen and pelvis was performed using the standard protocol following bolus administration of intravenous contrast. CONTRAST:  10029mSOVUE-300 IOPAMIDOL (ISOVUE-300) INJECTION 61% COMPARISON:  None. FINDINGS: Lower Chest: No acute findings. Hepatobiliary: No hepatic masses identified. Prior cholecystectomy. Stable mild biliary ductal dilatation, likely related to prior cholecystectomy. Pancreas:  No mass or inflammatory changes. Spleen: Within normal limits in size and appearance. Adrenals/Urinary Tract: No masses identified. No evidence of hydronephrosis. Stomach/Bowel: No  evidence of obstruction,  inflammatory process or abnormal fluid collections. Vascular/Lymphatic: No pathologically enlarged lymph nodes. No abdominal aortic aneurysm. Aortic atherosclerosis. Reproductive: 12 mm calcified fibroid in left uterine fundus. Adnexal regions are unremarkable. Other: Small right paraumbilical ventral hernia containing only fat. No evidence of herniated bowel loops. Musculoskeletal:  No suspicious bone lesions identified. IMPRESSION: No acute findings within the abdomen or pelvis. Small calcified uterine fibroid.  That Small right paraumbilical ventral hernia containing only fat. Electronically Signed   By: Earle Gell M.D.   On: 05/29/2017 16:58   Ct Biopsy  Result Date: 06/24/2017 INDICATION: 76 year old with leukocytosis and rule out myeloproliferative neoplasm. EXAM: CT GUIDED BONE MARROW ASPIRATES AND BIOPSY Physician: Stephan Minister. Anselm Pancoast, MD MEDICATIONS: None. ANESTHESIA/SEDATION: Fentanyl 200 mcg IV; Versed 4.0 mg IV Moderate Sedation Time:  33 minutes The patient was continuously monitored during the procedure by the interventional radiology nurse under my direct supervision. COMPLICATIONS: None immediate. PROCEDURE: The procedure was explained to the patient. The risks and benefits of the procedure were discussed and the patient's questions were addressed. Informed consent was obtained from the patient. The patient was placed prone on CT scan. Images of the pelvis were obtained. Patient was transferred to a new CT suite due to issues with the initial room. Repeat CT images were obtained with the patient prone. The back was prepped and draped in sterile fashion. The skin and right posterior iliac bone were anesthetized with 1% lidocaine. 11 gauge bone needle was directed into the right iliac bone with CT guidance. Two aspirates and one core biopsy obtained. Bandage placed over the puncture site. IMPRESSION: CT guided bone marrow aspirates and core biopsy. Electronically Signed   By: Markus Daft M.D.   On:  06/24/2017 13:46   Dg Chest Portable 1 View  Result Date: 05/29/2017 CLINICAL DATA:  C/o pain RLQ x 2 days, no chest pain or sob, no cough or congestion, hx. HTN, non smoker EXAM: PORTABLE CHEST 1 VIEW COMPARISON:  Chest radiograph 02/03/2012 FINDINGS: Stable large cardiac silhouette. There is central venous congestion. No effusion, infiltrate or pneumothorax. IMPRESSION: Cardiomegaly central venous congestion.  No clear acute findings. Electronically Signed   By: Suzy Bouchard M.D.   On: 05/29/2017 15:31   Ct Bone Marrow Biopsy & Aspiration  Result Date: 06/24/2017 INDICATION: 76 year old with leukocytosis and rule out myeloproliferative neoplasm. EXAM: CT GUIDED BONE MARROW ASPIRATES AND BIOPSY Physician: Stephan Minister. Anselm Pancoast, MD MEDICATIONS: None. ANESTHESIA/SEDATION: Fentanyl 200 mcg IV; Versed 4.0 mg IV Moderate Sedation Time:  33 minutes The patient was continuously monitored during the procedure by the interventional radiology nurse under my direct supervision. COMPLICATIONS: None immediate. PROCEDURE: The procedure was explained to the patient. The risks and benefits of the procedure were discussed and the patient's questions were addressed. Informed consent was obtained from the patient. The patient was placed prone on CT scan. Images of the pelvis were obtained. Patient was transferred to a new CT suite due to issues with the initial room. Repeat CT images were obtained with the patient prone. The back was prepped and draped in sterile fashion. The skin and right posterior iliac bone were anesthetized with 1% lidocaine. 11 gauge bone needle was directed into the right iliac bone with CT guidance. Two aspirates and one core biopsy obtained. Bandage placed over the puncture site. IMPRESSION: CT guided bone marrow aspirates and core biopsy. Electronically Signed   By: Markus Daft M.D.   On: 06/24/2017 13:46    ASSESSMENT & PLAN:  Erika [name] is a delightful, well-appearing 76 y.o. woman who was  admitted recently and found to have:   #1 Leukocytosis?predominantly Neutrophilia with some monocytosis Patient presented with a WBC count of 55.2k with 48.8k neutrophils.  WBC counts have spontaneously improved to 43k with 37.8k neutrophils.  Unknown chronicity of leukocytosis. Peripheral blood smear personally reviewed-shows significant neutrophilia with left shift.  Increased bands with a few myelocytes and metamyelocytes.  Pelgeroid neutrophils.  No significant increased blasts.  Monocytosis present.  Patient has significantly elevated CRP levels of 6.3 and a sed rate of 57. Pro-calcitonin level at baseline was not impressively elevated and was at 0.23.  No overt evidence of infection source at this time blood cultures Chest x-ray showed cardiomegaly with central venous congestion with no acute evidence of pneumonia UA. Does not suggest overt UTI. CT abdomen done for workup of her back pain did not show any acute intra-abdominal or pelvic findings. He does not have any overt splenomegaly on her CT abdomen -which is somewhat argues against a chronic myeloproliferative disorder though does not rule this out.  MRI of the lumbar spine shows old surgical and degenerative changes but no evidence of tumor or infectious collections.  CT chest on 05/31/2017: IMPRESSION: 1. Mild ground-glass opacities suggest mild pulmonary edema. 2. Small bilateral pleural effusions and basilar atelectasis. 3. No pulmonary infiltrate. 4. Mild subcarinal mediastinal adenopathy is likely reactive. 5. Cardiomegaly.  JAK2 and BCR-ABL mutations neg.  #2 mild normocytic anemia  Plan -I discussed all the clinical findings and available labs thus far with the patient -BM Bx results were discussed in details -- concerning for MPN NOS ?atypical CML. No significant dyspoiesis. No increased blasts. -peripheral blood heme [facility] testing order after discussing case with pathologist. -pending BM  cytogenetics. -patient had a workup to rule out an acute infectious process or abscess.  No overt source of infection noted at this time.  --Other possibility is that this could be a paraneoplastic leukocytosis associated with a lymphoproliferative disorder or a solid tumor.  This is not clinically apparent at this time.  CT chest would rule out presence of lymphadenopathy in the chest and other possible tumors to complete this workup.  LDH levels were within normal limits and did not suggest a high-grade lymphoma. -She will need to make sure she continues follow-up with her primary care physician to have age-appropriate and symptom directed cancer screening -Thoroughly explained all exam results    plan Labs today  RTC with Dr Irene Limbo in 1 month with labs    Sullivan Lone MD [place] AAHIVMS [facility] Hematology/Oncology Physician [place]  (Office):       [phone] (Work cell):  [phone] (Fax):           [phone]  06/26/2017 10:07 AM  This document serves as a record of services personally performed by Sullivan Lone, MD. It was created on his behalf by Alean Rinne, a trained medical scribe. The creation of this record is based on the scribe's personal observations and the provider's statements to them.   .I have reviewed the above documentation for accuracy and completeness, and I agree with the above. Brunetta Genera MD MS

## 2017-06-24 NOTE — Sedation Documentation (Signed)
Time out information restated

## 2017-06-24 NOTE — Sedation Documentation (Signed)
Per MD request, patient to be transported to other CT room. Pt will remain on monitor. Will continue to monitor. Roselyn Reef Yong Wahlquist,RN

## 2017-06-26 ENCOUNTER — Encounter: Payer: Self-pay | Admitting: Hematology

## 2017-06-26 ENCOUNTER — Ambulatory Visit: Payer: PPO

## 2017-06-26 ENCOUNTER — Ambulatory Visit (HOSPITAL_BASED_OUTPATIENT_CLINIC_OR_DEPARTMENT_OTHER): Payer: PPO | Admitting: Hematology

## 2017-06-26 ENCOUNTER — Other Ambulatory Visit: Payer: Self-pay

## 2017-06-26 ENCOUNTER — Other Ambulatory Visit (HOSPITAL_BASED_OUTPATIENT_CLINIC_OR_DEPARTMENT_OTHER): Payer: PPO

## 2017-06-26 VITALS — BP 149/74 | HR 93 | Temp 98.0°F | Resp 18 | Ht 68.0 in | Wt 297.5 lb

## 2017-06-26 DIAGNOSIS — D649 Anemia, unspecified: Secondary | ICD-10-CM | POA: Diagnosis not present

## 2017-06-26 DIAGNOSIS — D72829 Elevated white blood cell count, unspecified: Secondary | ICD-10-CM

## 2017-06-26 DIAGNOSIS — D471 Chronic myeloproliferative disease: Secondary | ICD-10-CM

## 2017-06-26 LAB — COMPREHENSIVE METABOLIC PANEL
ALBUMIN: 3.3 g/dL — AB (ref 3.5–5.0)
ALT: 11 U/L (ref 0–55)
AST: 13 U/L (ref 5–34)
Alkaline Phosphatase: 170 U/L — ABNORMAL HIGH (ref 40–150)
Anion Gap: 7 mEq/L (ref 3–11)
BILIRUBIN TOTAL: 0.36 mg/dL (ref 0.20–1.20)
BUN: 12.7 mg/dL (ref 7.0–26.0)
CO2: 27 meq/L (ref 22–29)
CREATININE: 0.9 mg/dL (ref 0.6–1.1)
Calcium: 9.2 mg/dL (ref 8.4–10.4)
Chloride: 106 mEq/L (ref 98–109)
EGFR: >60 mL/min/{1.73_m2} (ref >60)
GLUCOSE: 86 mg/dL (ref 70–140)
Potassium: 4.2 mEq/L (ref 3.5–5.1)
SODIUM: 140 meq/L (ref 136–145)
TOTAL PROTEIN: 7.8 g/dL (ref 6.4–8.3)

## 2017-06-26 LAB — CBC WITH DIFFERENTIAL/PLATELET
BASO%: 0.2 % (ref 0.0–2.0)
BASOS ABS: 0.1 10*3/uL (ref 0.0–0.1)
EOS ABS: 0.9 10*3/uL — AB (ref 0.0–0.5)
EOS%: 2 % (ref 0.0–7.0)
HEMATOCRIT: 35.8 % (ref 34.8–46.6)
HEMOGLOBIN: 11.4 g/dL — AB (ref 11.6–15.9)
LYMPH#: 2.5 10*3/uL (ref 0.9–3.3)
LYMPH%: 5.8 % — ABNORMAL LOW (ref 14.0–49.7)
MCH: 30.5 pg (ref 25.1–34.0)
MCHC: 31.8 g/dL (ref 31.5–36.0)
MCV: 95.7 fL (ref 79.5–101.0)
MONO#: 1.8 10*3/uL — ABNORMAL HIGH (ref 0.1–0.9)
MONO%: 4.1 % (ref 0.0–14.0)
NEUT%: 87.9 % — ABNORMAL HIGH (ref 38.4–76.8)
NEUTROS ABS: 37.8 10*3/uL — AB (ref 1.5–6.5)
Platelets: 154 10*3/uL (ref 145–400)
RBC: 3.74 10*6/uL (ref 3.70–5.45)
RDW: 15.6 % — ABNORMAL HIGH (ref 11.2–14.5)
WBC: 43 10*3/uL — ABNORMAL HIGH (ref 3.9–10.3)

## 2017-06-26 LAB — LACTATE DEHYDROGENASE: LDH: 244 U/L (ref 125–245)

## 2017-07-10 ENCOUNTER — Telehealth: Payer: Self-pay

## 2017-07-10 ENCOUNTER — Telehealth: Payer: Self-pay | Admitting: Hematology

## 2017-07-10 NOTE — Telephone Encounter (Signed)
Spoke with patient re appt. Schedule mailed °

## 2017-07-10 NOTE — Telephone Encounter (Signed)
Further information requested by [facility] via Amada Kingfisher, Customer Service Representative. Fax returned to [facility] at [phone]. Confirmed fax receipt 07/10/17 at 1542.

## 2017-07-14 ENCOUNTER — Other Ambulatory Visit: Payer: Self-pay | Admitting: Internal Medicine

## 2017-07-16 DIAGNOSIS — D471 Chronic myeloproliferative disease: Secondary | ICD-10-CM | POA: Diagnosis not present

## 2017-07-17 ENCOUNTER — Encounter (HOSPITAL_COMMUNITY): Payer: Self-pay

## 2017-07-17 LAB — CHROMOSOME ANALYSIS, BONE MARROW

## 2017-07-24 ENCOUNTER — Telehealth: Payer: Self-pay | Admitting: Hematology

## 2017-07-24 ENCOUNTER — Ambulatory Visit (HOSPITAL_BASED_OUTPATIENT_CLINIC_OR_DEPARTMENT_OTHER): Payer: PPO | Admitting: Hematology

## 2017-07-24 ENCOUNTER — Encounter: Payer: Self-pay | Admitting: Hematology

## 2017-07-24 ENCOUNTER — Other Ambulatory Visit (HOSPITAL_BASED_OUTPATIENT_CLINIC_OR_DEPARTMENT_OTHER): Payer: PPO

## 2017-07-24 VITALS — BP 134/78 | HR 89 | Temp 98.1°F | Resp 20 | Ht 68.0 in | Wt 289.4 lb

## 2017-07-24 DIAGNOSIS — D72821 Monocytosis (symptomatic): Secondary | ICD-10-CM | POA: Diagnosis not present

## 2017-07-24 DIAGNOSIS — D471 Chronic myeloproliferative disease: Secondary | ICD-10-CM

## 2017-07-24 DIAGNOSIS — D72828 Other elevated white blood cell count: Secondary | ICD-10-CM

## 2017-07-24 DIAGNOSIS — D72829 Elevated white blood cell count, unspecified: Secondary | ICD-10-CM | POA: Diagnosis not present

## 2017-07-24 LAB — COMPREHENSIVE METABOLIC PANEL
ALBUMIN: 3.4 g/dL — AB (ref 3.5–5.0)
AST: 11 U/L (ref 5–34)
Alkaline Phosphatase: 202 U/L — ABNORMAL HIGH (ref 40–150)
Anion Gap: 10 mEq/L (ref 3–11)
BILIRUBIN TOTAL: 0.29 mg/dL (ref 0.20–1.20)
BUN: 18 mg/dL (ref 7.0–26.0)
CO2: 27 meq/L (ref 22–29)
CREATININE: 1.1 mg/dL (ref 0.6–1.1)
Calcium: 9.2 mg/dL (ref 8.4–10.4)
Chloride: 103 mEq/L (ref 98–109)
EGFR: 57 mL/min/{1.73_m2} — ABNORMAL LOW (ref >60)
GLUCOSE: 85 mg/dL (ref 70–140)
Potassium: 4.2 mEq/L (ref 3.5–5.1)
SODIUM: 140 meq/L (ref 136–145)
TOTAL PROTEIN: 8.3 g/dL (ref 6.4–8.3)

## 2017-07-24 LAB — CBC & DIFF AND RETIC
BASO%: 0.5 % (ref 0.0–2.0)
BASOS ABS: 0.2 10*3/uL — AB (ref 0.0–0.1)
EOS%: 2.5 % (ref 0.0–7.0)
Eosinophils Absolute: 1.1 10*3/uL — ABNORMAL HIGH (ref 0.0–0.5)
HCT: 36 % (ref 34.8–46.6)
HEMOGLOBIN: 11.3 g/dL — AB (ref 11.6–15.9)
IMMATURE RETIC FRACT: 5 % (ref 1.60–10.00)
LYMPH#: 2.9 10*3/uL (ref 0.9–3.3)
LYMPH%: 6.8 % — AB (ref 14.0–49.7)
MCH: 29.4 pg (ref 25.1–34.0)
MCHC: 31.5 g/dL (ref 31.5–36.0)
MCV: 93.3 fL (ref 79.5–101.0)
MONO#: 1.8 10*3/uL — ABNORMAL HIGH (ref 0.1–0.9)
MONO%: 4.1 % (ref 0.0–14.0)
NEUT#: 36.8 10*3/uL — ABNORMAL HIGH (ref 1.5–6.5)
NEUT%: 86.1 % — AB (ref 38.4–76.8)
Platelets: 172 10*3/uL (ref 145–400)
RBC: 3.86 10*6/uL (ref 3.70–5.45)
RDW: 15.6 % — ABNORMAL HIGH (ref 11.2–14.5)
Retic %: 1.65 % (ref 0.70–2.10)
Retic Ct Abs: 63.69 10*3/uL (ref 33.70–90.70)
WBC: 42.8 10*3/uL — ABNORMAL HIGH (ref 3.9–10.3)

## 2017-07-24 NOTE — Telephone Encounter (Signed)
Gave avs and calendar for February 2019 °

## 2017-07-24 NOTE — Progress Notes (Signed)
HEMATOLOGY/ONCOLOGY CLINIC NOTE  Date of Service: 07/24/2017  Inpatient Attending: .Erika Genera, MD  CC: leucocytosis  HPI  Patient is a very pleasant 76 year old female with a history of morbid obesity, anemia, asthma, hypertension, lumbar spinal TB in 2002 who presented with acute onset of lower back pain on Monday. She reports that she drank some pepsi and tea on that day and her back pain began shortly after. No initial injury to precipitate her back pain. It gradually worsened until she was seen in the ED on 05/30/17. Labs during admission revealed WBC count of 55.2k with predominantly neutrophilia of 48.8k and some monocytosis at 2.2k.   She had minimal anemia at 11.5 with an MCV of 92 and a normal platelet count of 163k.  At that time she had CT A/P while in the ED which was normal and without splenomegaly. Additionally, she also had MRI of the L-spine which only showed degenerative changes. She was subsequently admitted for further workup and pain control and to rule out a source of infection.   Of note, the patient has a remote prior h/o epidural mass in 2002 (hemilaminectomy and decompression of tumor in epidural space 2002) which was thought to be secondary to lumbar epidural tuberculosis. She was placed on a course of antibiotics for several months following her diagnosis.   Following her admission, we were consulted for further workup. No recent new medications. She reports that her inhaler does contain steroids, but otherwise she has not been on a course of steroids recently. She is UTD with her regular cancer screenings. She does not feel any different now than she did 15moto a year ago.  No focal symptoms suggestive of infection other than the back pain at this time.  On ROS, she denies fever, chills, night sweats, unexpected weight loss, abdominal pain, sore throat, joint swelling, or any other associated symptoms. Her back pain is much improved with muscle  relaxants. She does note some right great toe pain, but otherwise she has no other acute symptoms.   OBJECTIVE:  MALEISHA PAONEis her for fu of persistent leucocytosis/neutrophilia. She has been doing very well since we last saw her. Her back/flank pain has resolved. Her WBC has remained at 42.8. Her case was discussed at hematology tumor board and at that time a consensus was made that her condition most closely represents a myeloproliferative neoplasm NOS. Foundation One Heme demonstrated a Chek 2 mutation.  On review of systems, pt denies fever, chills, rash, mouth sores, weight loss, decreased appetite, urinary complaints. Denies pain. Pt denies abdominal pain, nausea, vomiting. Pertinent positives are listed and detailed within the above HPI.  PHYSICAL EXAMINATION: . Vitals:   07/24/17 1428  BP: 134/78  Pulse: 89  Resp: 20  Temp: 98.1 F (36.7 C)  TempSrc: Oral  SpO2: 98%  Weight: 289 lb 6.4 oz (131.3 kg)  Height: 5' 8"  (1.727 m)   Filed Weights   07/24/17 1428  Weight: 289 lb 6.4 oz (131.3 kg)   .Body mass index is 44 kg/m.  GENERAL:alert, in no acute distress and comfortable SKIN: skin color, texture, turgor are normal, no rashes or significant lesions EYES: normal, conjunctiva are pink and non-injected, sclera clear OROPHARYNX:no exudate, no erythema and lips, buccal mucosa, and tongue normal  NECK: supple, no JVD, thyroid normal size, non-tender, without nodularity LYMPH:  no apparent palpable lymphadenopathy in the cervical, axillary or inguinal LUNGS: clear to auscultation with normal respiratory effort HEART: regular rate & rhythm,  no murmurs and no lower extremity edema ABDOMEN: abdomen obese soft, non-tender,distant bowel sounds , no overly palpable hepato-splenomegaly Musculoskeletal: no cyanosis of digits and no clubbing  PSYCH: alert & oriented x 3 with fluent speech NEURO: no focal motor/sensory deficits  MEDICAL HISTORY:  Past Medical History:    Diagnosis Date  . Anemia    with menses  . Asthma   . Back pain    status post surgery 2002  . Breast cyst    Excesion with FNA, begnin in 2004.   . Degenerative joint disease of spine    Imaging 2005,  Degenerative hypertrophic facet arthritis changes L4-5 and L5-S1..   . History of shingles    Recurrent with post herpetic neuralgia.   [name] Hypertension   . Lymphadenopathy    Of the mediastinum, Right side CXR 2008, not read on 2010 cxr.   . Menopause   . Obesity    BMI 54  . Psychosis ([place])    Secondary to prednisone.  . Shingles   . Stasis dermatitis    W/ LE edema, prviously on lasix now on mazxide.   . SVT (supraventricular tachycardia) [facility]) June 2009   one run while hospitalized  . Tuberculosis    active TB treated in 2002, hx of paraspinal lumbar TB,     SURGICAL HISTORY: Past Surgical History:  Procedure Laterality Date  . BREAST CYST ASPIRATION Left   . Breast cyst biopsy    . CHOLECYSTECTOMY  02/02/2012   Procedure: LAPAROSCOPIC CHOLECYSTECTOMY;  Surgeon: Zenovia Jarred, MD;  Location: [place];  Service: General;  Laterality: N/A;  . Hemilaminectomy of L4, L5 and S1 decompression of tumor in epidural space  2000    SOCIAL HISTORY: Social History   Socioeconomic History  . Marital status: Widowed    Spouse name: Not on file  . Number of children: Not on file  . Years of education: Not on file  . Highest education level: Not on file  Social Needs  . Financial resource strain: Not on file  . Food insecurity - worry: Not on file  . Food insecurity - inability: Not on file  . Transportation needs - medical: Not on file  . Transportation needs - non-medical: Not on file  Occupational History  . 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 Smoking status: Never Smoker  . Smokeless tobacco: Never Used  Substance and Sexual Activity  . Alcohol use: No    Alcohol/week: 0.0 oz  . Drug use: No   . Sexual activity: No    Birth control/protection: Post-menopausal  Other Topics Concern  . Not on file  Social History Narrative   No regular exercise, retired, single, volunteers at a local school.     FAMILY HISTORY: Family History  Problem Relation Age of Onset  . Stroke Mother        at young age  . Stroke Father        in 70's  . Heart attack Father   . Stroke Brother   . Stroke Brother   . Breast cancer Other     ALLERGIES:  is allergic to prednisone.  MEDICATIONS:  . Current Outpatient Medications:  .  albuterol (PROAIR HFA) 108 (90 Base) MCG/ACT inhaler, inhale 1 to 2 puffs every 6 hours if needed, Disp: 8.5 g, Rfl: 6 .  apixaban (ELIQUIS) 5 MG TABS tablet, Take 1 tablet (5 mg total) by mouth  2 (two) times daily., Disp: 60 tablet, Rfl: 0 .  aspirin EC 81 MG tablet, Take 81 mg by mouth daily., Disp: , Rfl:  .  Fluticasone-Salmeterol (ADVAIR DISKUS) 500-50 MCG/DOSE AEPB, INHALE 1 PUFF BY MOUTH TWICE A DAY, Disp: 180 each, Rfl: 3 .  furosemide (LASIX) 20 MG tablet, take 1 tablet by mouth once daily if needed for SWELLING IN LEGS OR FEET, Disp: 30 tablet, Rfl: 3 .  guaiFENesin-dextromethorphan (ROBITUSSIN DM) 100-10 MG/5ML syrup, Take 10 mLs by mouth every 4 (four) hours as needed for cough., Disp: 118 mL, Rfl: 0 .  lisinopril (PRINIVIL,ZESTRIL) 5 MG tablet, take 1 tablet by mouth once daily, Disp: 30 tablet, Rfl: 6 .  metoprolol tartrate (LOPRESSOR) 25 MG tablet, Take 0.5 tablets (12.5 mg total) by mouth 2 (two) times daily., Disp: 60 tablet, Rfl: 0 .  omeprazole (PRILOSEC) 20 MG capsule, Take 1 capsule (20 mg total) by mouth daily., Disp: 90 capsule, Rfl: 1 .  potassium chloride SA (K-DUR,KLOR-CON) 20 MEQ tablet, Take 0.5 tablets (10 mEq total) by mouth daily., Disp: 90 tablet, Rfl: 1 .  pravastatin (PRAVACHOL) 20 MG tablet, Take 1 tablet (20 mg total) by mouth daily., Disp: 90 tablet, Rfl: 3 .  traMADol (ULTRAM) 50 MG tablet, Take 1 tablet (50 mg total) by mouth every  12 (twelve) hours as needed., Disp: 6 tablet, Rfl: 0   REVIEW OF SYSTEMS:    A 10+ POINT REVIEW OF SYSTEMS WAS OBTAINED including neurology, dermatology, psychiatry, cardiac, respiratory, lymph, extremities, GI, GU, Musculoskeletal, constitutional, breasts, reproductive, HEENT.  All pertinent positives are noted in the HPI.  All others are negative.   LABORATORY DATA:  I have reviewed the data as listed   .      CBC Latest Ref Rng & Units 07/24/2017 06/26/2017 06/24/2017  WBC 3.9 - 10.3 10e3/uL 42.8(H) 43.0(H) 39.2(H)  Hemoglobin 11.6 - 15.9 g/dL 11.3(L) 11.4(L) 11.3(L)  Hematocrit 34.8 - 46.6 % 36.0 35.8 34.6(L)  Platelets 145 - 400 10e3/uL 172 154 171   . CBC    Component Value Date/Time   WBC 42.8 (H) 07/24/2017 1402   WBC 39.2 (H) 06/24/2017 0732   RBC 3.86 07/24/2017 1402   RBC 3.66 (L) 06/24/2017 0732   HGB 11.3 (L) 07/24/2017 1402   HCT 36.0 07/24/2017 1402   PLT 172 07/24/2017 1402   MCV 93.3 07/24/2017 1402   MCH 29.4 07/24/2017 1402   MCH 30.9 06/24/2017 0732   MCHC 31.5 07/24/2017 1402   MCHC 32.7 06/24/2017 0732   RDW 15.6 (H) 07/24/2017 1402   LYMPHSABS 2.9 07/24/2017 1402   MONOABS 1.8 (H) 07/24/2017 1402   EOSABS 1.1 (H) 07/24/2017 1402   BASOSABS 0.2 (H) 07/24/2017 1402   . CMP Latest Ref Rng & Units 07/24/2017 06/26/2017 06/12/2017  Glucose 70 - 140 mg/dl 85 86 99  BUN 7.0 - 26.0 mg/dL 18.0 12.7 10.8  Creatinine 0.6 - 1.1 mg/dL 1.1 0.9 1.0  Sodium 136 - 145 mEq/L 140 140 140  Potassium 3.5 - 5.1 mEq/L 4.2 4.2 4.4  Chloride 101 - 111 mmol/L - - -  CO2 22 - 29 mEq/L 27 27 26   Calcium 8.4 - 10.4 mg/dL 9.2 9.2 9.3  Total Protein 6.4 - 8.3 g/dL 8.3 7.8 7.7  Total Bilirubin 0.20 - 1.20 mg/dL 0.29 0.36 0.30  Alkaline Phos 40 - 150 U/L 202(H) 170(H) 202(H)  AST 5 - 34 U/L 11 13 14   ALT 0-55 U/L U/L <6 11 13  RADIOGRAPHIC STUDIES: I have personally reviewed the radiological images as listed and agreed with the findings in the report. No  results found.  ASSESSMENT & PLAN:  EVYNN BOUTELLE is a delightful, well-appearing 76 y.o. woman who was admitted recently and found to have:   #1 Leukocytosis?predominantly Neutrophilia with some monocytosis Patient presented with a WBC count of 55.2k with 48.8k neutrophils. Unknown chronicity of leukocytosis. Peripheral blood smear personally reviewed-shows significant neutrophilia with left shift.  Increased bands with a few myelocytes and metamyelocytes.  Pelgeroid neutrophils.  No significant increased blasts.  Monocytosis present.  Patient has significantly elevated CRP levels of 6.3 and a sed rate of 57. Pro-calcitonin level at baseline was not impressively elevated and was at 0.23.  No overt evidence of infection source at this time blood cultures Chest x-ray showed cardiomegaly with central venous congestion with no acute evidence of pneumonia UA. Does not suggest overt UTI. CT abdomen done for workup of her back pain did not show any acute intra-abdominal or pelvic findings. He does not have any overt splenomegaly on her CT abdomen -which is somewhat argues against a chronic myeloproliferative disorder though does not rule this out.  MRI of the lumbar spine shows old surgical and degenerative changes but no evidence of tumor or infectious collections.  Leucocytosis - slightly improved at 42k. No fevers/chills or other constitutional symptoms  Foundation One heme with Chek 2 mutation. No other findings noted.  CT chest on 05/31/2017: IMPRESSION: 1. Mild ground-glass opacities suggest mild pulmonary edema. 2. Small bilateral pleural effusions and basilar atelectasis. 3. No pulmonary infiltrate. 4. Mild subcarinal mediastinal adenopathy is likely reactive. 5. Cardiomegaly.  JAK2 and BCR-ABL mutations neg. #2 mild normocytic anemia  Plan -I discussed all the clinical findings and available labs thus far with the patient. Overall picture most consistent with MPN NOS VS  CMML.  --Other possibility is that this could be a paraneoplastic leukocytosis associated with a lymphoproliferative disorder or a solid tumor.  This is not clinically , lab or radiographic apparent at this time.     LDH levels were within normal limits and did not suggest a high-grade lymphoma. -She will need to make sure she continues follow-up with her primary care physician to have age-appropriate and symptom directed cancer screening -BM bx- this showed hypercellular bone marrow with granulocytic proliferation. Cytogenetic analysis was normal.  -I discussed with her the details of her recent bone marrow biopsy in great detail.  -I discussed with her about taking a low dose of Hydroxyurea vs monitoring vs a second opinion.  -She will opt for monitoring for now and she knows to call us if she should have any new worrisome symptoms.  ( no constitutional symptoms/rapid increase in WBC counts or other cytopenias at this time)   RTC with Dr Irene Limbo in 38mowith labs   Total time spent with patient 20 mins , total encounter time 2102ms - most of which was spent on direct patient contact, counseling , discussing treatment approaches and co-ordination of cares.  GaSullivan LoneD MS AAHIVMS SCPinnacle Cataract And Laser Institute LLCTLasting Hope Recovery Centerematology/Oncology Physician CoSagamore Surgical Services Inc(Office):       [phone] cell):  [phone]):           [phone]/19/2018 3:08 PM  This document serves as a record of services personally performed by GaSullivan LoneMD. It was created on his behalf by WiReola Moshera trained medical scribe. The creation of this record is based on the  scribe's personal observations and the provider's statements to them.   .I have reviewed the above documentation for accuracy and completeness, and I agree with the above. Erika Genera MD MS

## 2017-08-07 ENCOUNTER — Ambulatory Visit: Payer: PPO | Admitting: Internal Medicine

## 2017-08-23 ENCOUNTER — Other Ambulatory Visit: Payer: Self-pay | Admitting: Internal Medicine

## 2017-08-25 ENCOUNTER — Other Ambulatory Visit: Payer: Self-pay | Admitting: Internal Medicine

## 2017-09-18 ENCOUNTER — Other Ambulatory Visit: Payer: Self-pay

## 2017-09-18 ENCOUNTER — Ambulatory Visit (INDEPENDENT_AMBULATORY_CARE_PROVIDER_SITE_OTHER): Payer: PPO | Admitting: Internal Medicine

## 2017-09-18 ENCOUNTER — Encounter (INDEPENDENT_AMBULATORY_CARE_PROVIDER_SITE_OTHER): Payer: Self-pay

## 2017-09-18 ENCOUNTER — Encounter: Payer: Self-pay | Admitting: Internal Medicine

## 2017-09-18 VITALS — BP 133/69 | HR 98 | Temp 97.9°F | Ht 66.0 in | Wt 295.3 lb

## 2017-09-18 DIAGNOSIS — I10 Essential (primary) hypertension: Secondary | ICD-10-CM | POA: Diagnosis not present

## 2017-09-18 DIAGNOSIS — M47816 Spondylosis without myelopathy or radiculopathy, lumbar region: Secondary | ICD-10-CM

## 2017-09-18 DIAGNOSIS — K219 Gastro-esophageal reflux disease without esophagitis: Secondary | ICD-10-CM

## 2017-09-18 DIAGNOSIS — R269 Unspecified abnormalities of gait and mobility: Secondary | ICD-10-CM | POA: Diagnosis not present

## 2017-09-18 DIAGNOSIS — Z7901 Long term (current) use of anticoagulants: Secondary | ICD-10-CM

## 2017-09-18 DIAGNOSIS — M479 Spondylosis, unspecified: Secondary | ICD-10-CM

## 2017-09-18 DIAGNOSIS — E669 Obesity, unspecified: Secondary | ICD-10-CM

## 2017-09-18 DIAGNOSIS — D649 Anemia, unspecified: Secondary | ICD-10-CM | POA: Diagnosis not present

## 2017-09-18 DIAGNOSIS — J4540 Moderate persistent asthma, uncomplicated: Secondary | ICD-10-CM

## 2017-09-18 DIAGNOSIS — D72829 Elevated white blood cell count, unspecified: Secondary | ICD-10-CM | POA: Diagnosis not present

## 2017-09-18 DIAGNOSIS — Z0000 Encounter for general adult medical examination without abnormal findings: Secondary | ICD-10-CM

## 2017-09-18 DIAGNOSIS — E785 Hyperlipidemia, unspecified: Secondary | ICD-10-CM | POA: Diagnosis not present

## 2017-09-18 DIAGNOSIS — E876 Hypokalemia: Secondary | ICD-10-CM | POA: Diagnosis not present

## 2017-09-18 DIAGNOSIS — G8929 Other chronic pain: Secondary | ICD-10-CM | POA: Diagnosis not present

## 2017-09-18 DIAGNOSIS — I480 Paroxysmal atrial fibrillation: Secondary | ICD-10-CM | POA: Diagnosis not present

## 2017-09-18 DIAGNOSIS — Z79899 Other long term (current) drug therapy: Secondary | ICD-10-CM

## 2017-09-18 DIAGNOSIS — Z791 Long term (current) use of non-steroidal anti-inflammatories (NSAID): Secondary | ICD-10-CM

## 2017-09-18 DIAGNOSIS — Z79891 Long term (current) use of opiate analgesic: Secondary | ICD-10-CM

## 2017-09-18 DIAGNOSIS — K635 Polyp of colon: Secondary | ICD-10-CM

## 2017-09-18 DIAGNOSIS — Z7951 Long term (current) use of inhaled steroids: Secondary | ICD-10-CM

## 2017-09-18 DIAGNOSIS — M1611 Unilateral primary osteoarthritis, right hip: Secondary | ICD-10-CM

## 2017-09-18 DIAGNOSIS — Z7982 Long term (current) use of aspirin: Secondary | ICD-10-CM

## 2017-09-18 DIAGNOSIS — Z6841 Body Mass Index (BMI) 40.0 and over, adult: Secondary | ICD-10-CM

## 2017-09-18 NOTE — Assessment & Plan Note (Signed)
K was 4.2 on last check.  She takes with her lasix.  I think it is reasonable to continue this low dose supplementation.   Plan Check K at next visit.

## 2017-09-18 NOTE — Assessment & Plan Note (Signed)
Normocytic with only mildly decreased Hgb.   Monitor

## 2017-09-18 NOTE — Patient Instructions (Addendum)
Erika [name] - -  Your blood pressure, asthma are doing well.  Please keep taking your medications as prescribed.   Please follow up with Oncology (Dr. Irene Limbo) and your heart doctor.    It is important that you get your colonoscopy.  If possible, please pay the bill at your GI doctor and see if you can get your colonoscopy this year.  Thank you, we can discuss more at next visit as well.    Thank you!  Please come back to see me in 6 months.

## 2017-09-18 NOTE — Assessment & Plan Note (Signed)
Well controlled on omeprazole.   Consider weaning down in future  Plan Continue omeprazole.

## 2017-09-18 NOTE — Assessment & Plan Note (Signed)
She was reporting some hip pain today, which was actually located in the lower back.  I think this is related to her known OA.  She is taking tramadol ([place] narcotic database appropriate for 12 months) without much help.  She takes BC powder which helps 1-2 times per week on bad days.   Plan Continue tramadol, advised heat and ice Discussed danger of BC powder and to monitor for changes.

## 2017-09-18 NOTE — Progress Notes (Signed)
   Subjective:    Patient ID: Erika [name], female    DOB: 1940/09/27, 77 y.o.   MRN: [id number]  6 month follow up for HTN, HLD  HPI   Erika [name] is a 77yo woman with PMH of HTN, GERd, HLD, asthma, AF who presents for routine follow up.   She has been following with Oncology for elevated WBC.  She is thought to have myeloproliferative neoplasm NOS vs. CMML and she and Dr. Irene Limbo have opted for observation at this point.  She has an appointment with Oncology on 09/24/17.    Erika [name] reports that she is doing well.  She has chronic arthritis and today notes that she is having back pain and twinges that sometimes "lock up" on her.  She locates the pain to her right lower back.  She has no groin or lower hip pain.  She takes tramadol which helps somewhat and she also takes BC powder, which she notes helps even more.  She only takes this up to 2 times per week.  I reminded her that she is also taking eliquis and aspirin and she should not take BC powder regularly or daily.  She is taking omeprazole and denies dyspepsia, worsening GERD, blood in the stool.   Otherwise, she feels she is doing well and has no other complaints today.  We discussed her colonoscopy and she will try to pay off her bill with GI today and get that scheduled this year.    BP was initially elevated and on recheck was improved to 133/69.    She expresses some food insecurity.  We provided her with food today and information about a weekly food bank which provides fresh fruits and vegetables.      Review of Systems  Constitutional: Negative for activity change, fatigue and fever.  Respiratory: Negative for cough, shortness of breath and wheezing.   Cardiovascular: Positive for leg swelling (intermittent, better with lasix). Negative for chest pain.  Gastrointestinal: Negative for blood in stool, diarrhea and rectal pain.  Musculoskeletal: Positive for arthralgias, back pain and gait problem (using cane).    Neurological: Negative for dizziness, weakness and light-headedness.       Objective:   Physical Exam  Constitutional: She is oriented to person, place, and time. She appears well-developed and well-nourished.  Obese  HENT:  Head: Normocephalic and atraumatic.  Eyes: Conjunctivae are normal. No scleral icterus.  Cardiovascular: Normal rate, regular rhythm and normal heart sounds.  No murmur heard. Pulmonary/Chest: Effort normal and breath sounds normal. No respiratory distress. She has no wheezes.  Abdominal: Soft. Bowel sounds are normal.  Musculoskeletal:  She has some mild back pain to palpation on the right lower back.  She has a delayed get up an go.  She is using a cane.  No groin pain to palpation, no pain over greater trochanter on the right.   Neurological: She is alert and oriented to person, place, and time.  Psychiatric: She has a normal mood and affect. Her behavior is normal.  Vitals reviewed.  No labs today.        Assessment & Plan:  RTC in 6 months, sooner if needed.

## 2017-09-18 NOTE — Assessment & Plan Note (Signed)
Pulse was mildly elevated today, but seemed regular.  It improved to 96.  She had not taken her metoprolol yet today.  She has no palpitations or chest pain.  She is taking her eliquis without issue.  We discussed at length the dangers of taking BC powder with a blood thinner.   Plan Continue f/u with Cardiology Continue metoprolol, eliquis, aspirin

## 2017-09-18 NOTE — Assessment & Plan Note (Signed)
She is following with Hematology, appointment next week.  Reviewed their notes and appears to be myeloproliferative neoplasm NOS vs CMML.  She has no B symptoms today, no weight loss or fevers.  She is feeling well.    Plan Continue observation, follow up H/O recommendations. (Dr. Irene Limbo)

## 2017-09-18 NOTE — Assessment & Plan Note (Signed)
Well controlled today on Advair and PRN albuterol.  No wheezing on exam.   Plan Continue advair and albuterol.

## 2017-09-18 NOTE — Assessment & Plan Note (Signed)
BP was initially high today, but improved with rest to 133/69.  She is taking her three medications without issue.    Plan Continue metoprolol, lisinopril, amlodipine

## 2017-09-18 NOTE — Assessment & Plan Note (Signed)
DEXA - Nml in 2014 MMG - 2018 normal Pneumo 13 - 2016; 23 - 2009 Flu - Done Pap - nml in 2002, 2008, 2011, > 75yo old, no need to screen.  Colon - 2004 + polyp, needs follow up colonoscopy.  I advised her to try and get in to see GI.

## 2017-09-20 ENCOUNTER — Other Ambulatory Visit: Payer: Self-pay | Admitting: Internal Medicine

## 2017-09-24 ENCOUNTER — Other Ambulatory Visit: Payer: PPO

## 2017-09-24 ENCOUNTER — Ambulatory Visit: Payer: PPO | Admitting: Hematology

## 2017-10-01 ENCOUNTER — Telehealth: Payer: Self-pay

## 2017-10-01 ENCOUNTER — Inpatient Hospital Stay: Payer: PPO | Attending: Hematology

## 2017-10-01 ENCOUNTER — Encounter: Payer: Self-pay | Admitting: Hematology

## 2017-10-01 ENCOUNTER — Inpatient Hospital Stay (HOSPITAL_BASED_OUTPATIENT_CLINIC_OR_DEPARTMENT_OTHER): Payer: PPO | Admitting: Hematology

## 2017-10-01 VITALS — BP 130/77 | HR 100 | Temp 97.8°F | Resp 24 | Ht 66.0 in | Wt 284.5 lb

## 2017-10-01 DIAGNOSIS — Z8619 Personal history of other infectious and parasitic diseases: Secondary | ICD-10-CM | POA: Insufficient documentation

## 2017-10-01 DIAGNOSIS — Z7982 Long term (current) use of aspirin: Secondary | ICD-10-CM | POA: Insufficient documentation

## 2017-10-01 DIAGNOSIS — Z8611 Personal history of tuberculosis: Secondary | ICD-10-CM | POA: Diagnosis not present

## 2017-10-01 DIAGNOSIS — D72821 Monocytosis (symptomatic): Secondary | ICD-10-CM | POA: Insufficient documentation

## 2017-10-01 DIAGNOSIS — M79674 Pain in right toe(s): Secondary | ICD-10-CM | POA: Diagnosis not present

## 2017-10-01 DIAGNOSIS — Z79899 Other long term (current) drug therapy: Secondary | ICD-10-CM | POA: Insufficient documentation

## 2017-10-01 DIAGNOSIS — D649 Anemia, unspecified: Secondary | ICD-10-CM | POA: Insufficient documentation

## 2017-10-01 DIAGNOSIS — J45909 Unspecified asthma, uncomplicated: Secondary | ICD-10-CM | POA: Insufficient documentation

## 2017-10-01 DIAGNOSIS — M545 Low back pain: Secondary | ICD-10-CM

## 2017-10-01 DIAGNOSIS — D471 Chronic myeloproliferative disease: Secondary | ICD-10-CM

## 2017-10-01 DIAGNOSIS — I10 Essential (primary) hypertension: Secondary | ICD-10-CM | POA: Insufficient documentation

## 2017-10-01 DIAGNOSIS — R7982 Elevated C-reactive protein (CRP): Secondary | ICD-10-CM | POA: Insufficient documentation

## 2017-10-01 DIAGNOSIS — D72828 Other elevated white blood cell count: Secondary | ICD-10-CM

## 2017-10-01 LAB — CBC WITH DIFFERENTIAL (CANCER CENTER ONLY)
BASOS PCT: 0 %
Basophils Absolute: 0.1 10*3/uL (ref 0.0–0.1)
Eosinophils Absolute: 0.8 10*3/uL — ABNORMAL HIGH (ref 0.0–0.5)
Eosinophils Relative: 2 %
HEMATOCRIT: 36.6 % (ref 34.8–46.6)
Hemoglobin: 11.5 g/dL — ABNORMAL LOW (ref 11.6–15.9)
Lymphocytes Relative: 6 %
Lymphs Abs: 2.6 10*3/uL (ref 0.9–3.3)
MCH: 29.6 pg (ref 25.1–34.0)
MCHC: 31.4 g/dL — ABNORMAL LOW (ref 31.5–36.0)
MCV: 94.1 fL (ref 79.5–101.0)
MONOS PCT: 5 %
Monocytes Absolute: 2.2 10*3/uL — ABNORMAL HIGH (ref 0.1–0.9)
NEUTROS ABS: 38.2 10*3/uL — AB (ref 1.5–6.5)
Neutrophils Relative %: 87 %
Platelet Count: 162 10*3/uL (ref 145–400)
RBC: 3.89 MIL/uL (ref 3.70–5.45)
RDW: 15 % — AB (ref 11.2–14.5)
WBC Count: 43.9 10*3/uL — ABNORMAL HIGH (ref 3.9–10.3)

## 2017-10-01 LAB — COMPREHENSIVE METABOLIC PANEL
ALK PHOS: 199 U/L — AB (ref 40–150)
ALT: 9 U/L (ref 0–55)
AST: 13 U/L (ref 5–34)
Albumin: 3.4 g/dL — ABNORMAL LOW (ref 3.5–5.0)
Anion gap: 9 (ref 3–11)
BILIRUBIN TOTAL: 0.3 mg/dL (ref 0.2–1.2)
BUN: 13 mg/dL (ref 7–26)
CO2: 28 mmol/L (ref 22–29)
Calcium: 9.3 mg/dL (ref 8.4–10.4)
Chloride: 104 mmol/L (ref 98–109)
Creatinine, Ser: 0.94 mg/dL (ref 0.60–1.10)
GFR calc Af Amer: >60 mL/min (ref >60)
GFR calc non Af Amer: 58 mL/min — ABNORMAL LOW (ref >60)
Glucose, Bld: 78 mg/dL (ref 70–140)
POTASSIUM: 3.8 mmol/L (ref 3.5–5.1)
Sodium: 141 mmol/L (ref 136–145)
TOTAL PROTEIN: 8.3 g/dL (ref 6.4–8.3)

## 2017-10-01 LAB — RETICULOCYTES
RBC.: 3.89 MIL/uL (ref 3.70–5.45)
Retic Count, Absolute: 62.2 10*3/uL (ref 33.7–90.7)
Retic Ct Pct: 1.6 % (ref 0.7–2.1)

## 2017-10-01 LAB — LACTATE DEHYDROGENASE: LDH: 279 U/L — ABNORMAL HIGH (ref 125–245)

## 2017-10-01 NOTE — Progress Notes (Signed)
HEMATOLOGY/ONCOLOGY CLINIC NOTE  Date of Service: 10/01/2017  Inpatient Attending: .Brunetta Genera, MD  CC:  F/u MPN NOS chek2 mutation +ve  HPI  Patient is a very pleasant 77 year old female with a history of morbid obesity, anemia, asthma, hypertension, lumbar spinal TB in 2002 who presented with acute onset of lower back pain on Monday. She reports that she drank some pepsi and tea on that day and her back pain began shortly after. No initial injury to precipitate her back pain. It gradually worsened until she was seen in the ED on 05/30/17. Labs during admission revealed WBC count of 55.2k with predominantly neutrophilia of 48.8k and some monocytosis at 2.2k.   She had minimal anemia at 11.5 with an MCV of 92 and a normal platelet count of 163k.  At that time she had CT A/P while in the ED which was normal and without splenomegaly. Additionally, she also had MRI of the L-spine which only showed degenerative changes. She was subsequently admitted for further workup and pain control and to rule out a source of infection.   Of note, the patient has a remote prior h/o epidural mass in 2002 (hemilaminectomy and decompression of tumor in epidural space 2002) which was thought to be secondary to lumbar epidural tuberculosis. She was placed on a course of antibiotics for several months following her diagnosis.   Following her admission, we were consulted for further workup. No recent new medications. She reports that her inhaler does contain steroids, but otherwise she has not been on a course of steroids recently. She is UTD with her regular cancer screenings. She does not feel any different now than she did 20moto a year ago.  No focal symptoms suggestive of infection other than the back pain at this time.  On ROS, she denies fever, chills, night sweats, unexpected weight loss, abdominal pain, sore throat, joint swelling, or any other associated symptoms. Her back pain is much improved  with muscle relaxants. She does note some right great toe pain, but otherwise she has no other acute symptoms.   INTERVAL HISTORY:   MNAVAEH KEHRESis her for follow up of persistent leucocytosis/neutrophilia. Concerning for chronic MPN NOS with Chek2 mutation. She presents to the clinic today noting she has been doing well overall. No new conditions or changes since last visit.  No fevers /chills/nightsweats. No new abdominal pain. No constitutional symptoms.  On review of symptoms, pt notes mildly increased leg pain, tolerable. She notes slight purposeful weight loss. She has been trying to eat well and remain mildly active. Will get some back pain depending on the activities she is doing.  She denies fever, chills or night sweats. She denies any new pain or signs  Infection or any new symptoms.     MEDICAL HISTORY:  Past Medical History:  Diagnosis Date  . Anemia    with menses  . Asthma   . Back pain    status post surgery 2002  . Breast cyst    Excesion with FNA, begnin in 2004.   . Degenerative joint disease of spine    Imaging 2005,  Degenerative hypertrophic facet arthritis changes L4-5 and L5-S1..   . History of shingles    Recurrent with post herpetic neuralgia.   .Marland KitchenHypertension   . Lymphadenopathy    Of the mediastinum, Right side CXR 2008, not read on 2010 cxr.   . Menopause   . Obesity    BMI 54  . Psychosis ([place]  Secondary to prednisone.  . Shingles   . Stasis dermatitis    W/ LE edema, prviously on lasix now on mazxide.   . SVT (supraventricular tachycardia) [facility]) June 2009   one run while hospitalized  . Tuberculosis    active TB treated in 2002, hx of paraspinal lumbar TB,     SURGICAL HISTORY: Past Surgical History:  Procedure Laterality Date  . BREAST CYST ASPIRATION Left   . Breast cyst biopsy    . CHOLECYSTECTOMY  02/02/2012   Procedure: LAPAROSCOPIC CHOLECYSTECTOMY;  Surgeon: Zenovia Jarred, MD;  Location: [facility];  Service: General;   Laterality: N/A;  . Hemilaminectomy of L4, L5 and S1 decompression of tumor in epidural space  2000    SOCIAL HISTORY: Social History   Socioeconomic History  . Marital status: Widowed    Spouse name: Not on file  . Number of children: Not on file  . Years of education: Not on file  . Highest education level: Not on file  Social Needs  . Financial resource strain: Not on file  . Food insecurity - worry: Not on file  . Food insecurity - inability: Not on file  . Transportation needs - medical: Not on file  . Transportation needs - non-medical: Not on file  Occupational History  . 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 Smoking status: Never Smoker  . Smokeless tobacco: Never Used  Substance and Sexual Activity  . Alcohol use: No    Alcohol/week: 0.0 oz  . Drug use: No  . Sexual activity: No    Birth control/protection: Post-menopausal  Other Topics Concern  . Not on file  Social History Narrative   No regular exercise, retired, single, volunteers at a local school.     FAMILY HISTORY: Family History  Problem Relation Age of Onset  . Stroke Mother        at young age  . Stroke Father        in 75's  . Heart attack Father   . Stroke Brother   . Stroke Brother   . Breast cancer Other     ALLERGIES:  is allergic to prednisone.  MEDICATIONS:  . Current Outpatient Medications:  .  albuterol (PROAIR HFA) 108 (90 Base) MCG/ACT inhaler, inhale 1 to 2 puffs every 6 hours if needed, Disp: 8.5 g, Rfl: 6 .  amLODipine (NORVASC) 10 MG tablet, , Disp: , Rfl: 0 .  aspirin EC 81 MG tablet, Take 81 mg by mouth daily., Disp: , Rfl:  .  ELIQUIS 5 MG TABS tablet, take 1 tablet by mouth twice a day, Disp: 60 tablet, Rfl: 0 .  Fluticasone-Salmeterol (ADVAIR DISKUS) 500-50 MCG/DOSE AEPB, INHALE 1 PUFF BY MOUTH TWICE A DAY, Disp: 180 each, Rfl: 3 .  furosemide (LASIX) 20 MG tablet, take 1 tablet by mouth once daily  if needed FOR SWELLING IN LEGS OR FEET, Disp: 30 tablet, Rfl: 3 .  guaiFENesin-dextromethorphan (ROBITUSSIN DM) 100-10 MG/5ML syrup, Take 10 mLs by mouth every 4 (four) hours as needed for cough., Disp: 118 mL, Rfl: 0 .  lisinopril (PRINIVIL,ZESTRIL) 5 MG tablet, take 1 tablet by mouth once daily, Disp: 30 tablet, Rfl: 6 .  metoprolol tartrate (LOPRESSOR) 25 MG tablet, Take 0.5 tablets (12.5 mg total) by mouth 2 (two) times daily., Disp: 60 tablet, Rfl: 0 .  omeprazole (PRILOSEC) 20 MG capsule, Take 1 capsule (20 mg total) by mouth daily., Disp:  90 capsule, Rfl: 1 .  potassium chloride SA (K-DUR,KLOR-CON) 20 MEQ tablet, Take 0.5 tablets (10 mEq total) by mouth daily., Disp: 90 tablet, Rfl: 1 .  pravastatin (PRAVACHOL) 20 MG tablet, Take 1 tablet (20 mg total) by mouth daily., Disp: 90 tablet, Rfl: 3 .  traMADol (ULTRAM) 50 MG tablet, Take 1 tablet (50 mg total) by mouth every 12 (twelve) hours as needed., Disp: 6 tablet, Rfl: 0   REVIEW OF SYSTEMS:   .10 Point review of Systems was done is negative except as noted above.  PHYSICAL EXAMINATION:  Vitals:   10/01/17 1336  BP: 130/77  Pulse: 100  Resp: (!) 24  Temp: 97.8 F (36.6 C)  TempSrc: Oral  SpO2: 98%  Weight: 284 lb 8 oz (129 kg)  Height: 5' 6"  (1.676 m)    . GENERAL:alert, in no acute distress and comfortable SKIN: no acute rashes, no significant lesions EYES: conjunctiva are pink and non-injected, sclera anicteric OROPHARYNX: MMM, no exudates, no oropharyngeal erythema or ulceration NECK: supple, no JVD LYMPH:  no palpable lymphadenopathy in the cervical, axillary or inguinal regions LUNGS: clear to auscultation b/l with normal respiratory effort HEART: regular rate & rhythm ABDOMEN:  normoactive bowel sounds , non tender, not distended. Extremity: no pedal edema PSYCH: alert & oriented x 3 with fluent speech NEURO: no focal motor/sensory deficits   LABORATORY DATA:  I have reviewed the data as  listed   .       CBC Latest Ref Rng & Units 10/01/2017 07/24/2017 06/26/2017  WBC 3.9 - 10.3 K/uL 43.9(H) 42.8(H) 43.0(H)  Hemoglobin 11.6 - 15.9 g/dL - 11.3(L) 11.4(L)  Hematocrit 34.8 - 46.6 % 36.6 36.0 35.8  Platelets 145 - 400 K/uL 162 172 154   . CBC    Component Value Date/Time   WBC 43.9 (H) 10/01/2017 1315   WBC 42.8 (H) 07/24/2017 1402   WBC 39.2 (H) 06/24/2017 0732   RBC 3.89 10/01/2017 1315   RBC 3.89 10/01/2017 1315   HGB 11.3 (L) 07/24/2017 1402   HCT 36.6 10/01/2017 1315   HCT 36.0 07/24/2017 1402   PLT 162 10/01/2017 1315   PLT 172 07/24/2017 1402   MCV 94.1 10/01/2017 1315   MCV 93.3 07/24/2017 1402   MCH 29.6 10/01/2017 1315   MCHC 31.4 (L) 10/01/2017 1315   RDW 15.0 (H) 10/01/2017 1315   RDW 15.6 (H) 07/24/2017 1402   LYMPHSABS 2.6 10/01/2017 1315   LYMPHSABS 2.9 07/24/2017 1402   MONOABS 2.2 (H) 10/01/2017 1315   MONOABS 1.8 (H) 07/24/2017 1402   EOSABS 0.8 (H) 10/01/2017 1315   EOSABS 1.1 (H) 07/24/2017 1402   BASOSABS 0.1 10/01/2017 1315   BASOSABS 0.2 (H) 07/24/2017 1402   . CMP Latest Ref Rng & Units 10/01/2017 07/24/2017 06/26/2017  Glucose 70 - 140 mg/dL 78 85 86  BUN 7 - 26 mg/dL 13 18.0 12.7  Creatinine 0.60 - 1.10 mg/dL 0.94 1.1 0.9  Sodium 136 - 145 mmol/L 141 140 140  Potassium 3.5 - 5.1 mmol/L 3.8 4.2 4.2  Chloride 98 - 109 mmol/L 104 - -  CO2 22 - 29 mmol/L 28 27 27   Calcium 8.4 - 10.4 mg/dL 9.3 9.2 9.2  Total Protein 6.4 - 8.3 g/dL 8.3 8.3 7.8  Total Bilirubin 0.2 - 1.2 mg/dL 0.3 0.29 0.36  Alkaline Phos 40 - 150 U/L 199(H) 202(H) 170(H)  AST 5 - 34 U/L 13 11 13   ALT 0 - 55 U/L 9 <6 11  RADIOGRAPHIC STUDIES: I have personally reviewed the radiological images as listed and agreed with the findings in the report. No results found.  ASSESSMENT & PLAN:  Erika [name] is a delightful, well-appearing 77 y.o. woman who was admitted recently and found to have:   #1 Leukocytosis?predominantly Neutrophilia with some  monocytosis CMML vs MPN NOS Patient initially presented with a WBC count of 55.2k with 48.8k neutrophils.  WBC counts relatively stable at 43.9k  Peripheral blood smear personally previously reviewed-shows significant neutrophilia with left shift.  Increased bands with a few myelocytes and metamyelocytes.  Pelgeroid neutrophils.  No significant increased blasts.  Monocytosis present.  Patient had significantly elevated CRP levels of 6.3 and a sed rate of 57. Pro-calcitonin level at baseline was not impressively elevated and was at 0.23.  No overt evidence of infection source at this time blood cultures Chest x-ray showed cardiomegaly with central venous congestion with no acute evidence of pneumonia UA. Does not suggest overt UTI. CT abdomen done for workup of her back pain did not show any acute intra-abdominal or pelvic findings. He does not have any overt splenomegaly on her CT abdomen -which is somewhat argues against a chronic myeloproliferative disorder though does not rule this out.  MRI of the lumbar spine shows old surgical and degenerative changes but no evidence of tumor or infectious collections.  Leucocytosis - stable at 43.9k today. No fevers/chills or other constitutional symptoms  Foundation One heme with Chek 2 mutation. No other findings noted.  CT chest on 05/31/2017: IMPRESSION: 1. Mild ground-glass opacities suggest mild pulmonary edema. 2. Small bilateral pleural effusions and basilar atelectasis. 3. No pulmonary infiltrate. 4. Mild subcarinal mediastinal adenopathy is likely reactive. 5. Cardiomegaly.  JAK2 and BCR-ABL mutations neg. LDH levels were within normal limits and did not suggest a high-grade lymphoma.  06/24/17 BM bx- this showed hypercellular bone marrow with granulocytic proliferation. Cytogenetic analysis was normal.   #2 mild normocytic anemia. HG at 11.5 today   Plan  -WBC remain stable in 43K and Hg holding at 11.5 today -She will need to  make sure she continues follow-up with her primary care physician to have age-appropriate and symptom directed cancer screening -I again discussed with her about taking a low dose of Hydroxyurea vs monitoring -She still opts for monitoring for now and she knows to call us if she should have any new worrisome symptoms.  (no constitutional symptoms/rapid increase in WBC counts or other cytopenias at this time) -Will do another 2 months follow up. If counts remain stable will space out visit more.  -I encouraged her to stay active and eat healthy.     RTC with Dr Irene Limbo in 2 months with labs    . The total time spent in the appointment was 20 minutes and more than 50% was on counseling and direct patient cares.   Sullivan Lone MD MS AAHIVMS [facility] Hematology/Oncology Physician [facility]  (Office):       [phone] (Work cell):  [phone] (Fax):           [phone]  10/01/2017 2:24 PM  This document serves as a record of services personally performed by Sullivan Lone, MD. It was created on his behalf by Joslyn Devon, a trained medical scribe. The creation of this record is based on the scribe's personal observations and the provider's statements to them.    .I have reviewed the above documentation for accuracy and completeness, and I agree with the above. Cloria Spring  Irene Limbo MD [place]

## 2017-10-01 NOTE — Telephone Encounter (Signed)
Printed avs and calender of upcoming appointment.  Per 2/26 los 

## 2017-10-10 ENCOUNTER — Other Ambulatory Visit: Payer: Self-pay

## 2017-10-10 NOTE — Telephone Encounter (Signed)
traMADol (ULTRAM) 50 MG tablet, refill request @ [facility] on [address].

## 2017-10-11 MED ORDER — TRAMADOL HCL 50 MG PO TABS: 50.0000 mg | ORAL_TABLET | Freq: Two times a day (BID) | ORAL | 2 refills | Status: DC | PRN

## 2017-10-31 ENCOUNTER — Telehealth: Payer: Self-pay

## 2017-10-31 ENCOUNTER — Ambulatory Visit (INDEPENDENT_AMBULATORY_CARE_PROVIDER_SITE_OTHER): Payer: PPO | Admitting: Internal Medicine

## 2017-10-31 ENCOUNTER — Other Ambulatory Visit: Payer: Self-pay

## 2017-10-31 VITALS — BP 133/82 | HR 107 | Temp 97.8°F | Ht 67.0 in | Wt 283.2 lb

## 2017-10-31 DIAGNOSIS — J00 Acute nasopharyngitis [common cold]: Secondary | ICD-10-CM | POA: Insufficient documentation

## 2017-10-31 DIAGNOSIS — Z79899 Other long term (current) drug therapy: Secondary | ICD-10-CM

## 2017-10-31 DIAGNOSIS — I10 Essential (primary) hypertension: Secondary | ICD-10-CM

## 2017-10-31 DIAGNOSIS — N393 Stress incontinence (female) (male): Secondary | ICD-10-CM | POA: Diagnosis not present

## 2017-10-31 MED ORDER — DM-GUAIFENESIN ER 30-600 MG PO TB12: 1.0000 | ORAL_TABLET | Freq: Two times a day (BID) | ORAL | 0 refills | Status: DC

## 2017-10-31 MED ORDER — SALINE SPRAY 0.65 % NA SOLN: 1.0000 | NASAL | 0 refills | Status: DC | PRN

## 2017-10-31 MED ORDER — FLUTICASONE PROPIONATE 50 MCG/ACT NA SUSP: 1.0000 | Freq: Every day | NASAL | 2 refills | Status: DC

## 2017-10-31 MED ORDER — FEXOFENADINE HCL 180 MG PO TABS: 180.0000 mg | ORAL_TABLET | Freq: Every day | ORAL | 2 refills | Status: DC

## 2017-10-31 NOTE — Assessment & Plan Note (Signed)
BP Readings from Last 3 Encounters:  10/31/17 133/82  10/01/17 130/77  09/18/17 133/69   On presentation her blood pressure was little elevated at 156/79 which improved to 133 on recheck.  -Continue current management.

## 2017-10-31 NOTE — Assessment & Plan Note (Signed)
Symptoms and exam was more consistent with common cold and viral upper respiratory symptoms.  She was given a prescription of Allegra and Mucinex DM. When a prescription for Flonase and saline nasal spray. She was told to follow-up in clinic if her symptoms get worse or fail to improve within 1 week.

## 2017-10-31 NOTE — Telephone Encounter (Signed)
Want to speak with a nurse about meds. Please call pt back.

## 2017-10-31 NOTE — Patient Instructions (Signed)
Thank you for visiting clinic today. I am giving you an allergy medicine called Allegra, Mucinex DM and 2 nasal sprays. Please take it as directed. Continue taking your other medicine. Please call the clinic if your symptoms fail to resolve within next week.

## 2017-10-31 NOTE — Progress Notes (Signed)
   CC: Runny nose and cough for 1 week.  HPI:  [name] is a 77 y.o. with past medical history as listed below came to the clinic with complaint of intermittent nasal congestion along with some dry cough and postnasal drip for 1 week.  Any nausea, vomiting, generalized malaise or fever.  She denies any urinary symptoms, except having some stress incontinence with excessive coughing.  She was taking Robitussin cough syrup at night which do help with her cough and she was able to sleep with it. She denies any excessive sneezing and watery eyes. She denies any change in her appetite or bowel habit.  Past Medical History:  Diagnosis Date  . Anemia    with menses  . Asthma   . Back pain    status post surgery 2002  . Breast cyst    Excesion with FNA, begnin in 2004.   . Degenerative joint disease of spine    Imaging 2005,  Degenerative hypertrophic facet arthritis changes L4-5 and L5-S1..   . History of shingles    Recurrent with post herpetic neuralgia.   [name] Hypertension   . Lymphadenopathy    Of the mediastinum, Right side CXR 2008, not read on 2010 cxr.   . Menopause   . Obesity    BMI 54  . Psychosis ([place])    Secondary to prednisone.  . Shingles   . Stasis dermatitis    W/ LE edema, prviously on lasix now on mazxide.   . SVT (supraventricular tachycardia) [facility]) June 2009   one run while hospitalized  . Tuberculosis    active TB treated in 2002, hx of paraspinal lumbar TB,    Review of Systems: Negative except mentioned in HPI  Physical Exam:  Vitals:   10/31/17 1504  BP: (!) 156/79  Pulse: (!) 105  Temp: 97.8 F (36.6 C)  TempSrc: Oral  SpO2: 100%  Weight: 283 lb 3.2 oz (128.5 kg)  Height: 5\' 7"  (1.702 m)    General: Vital signs reviewed.  Patient is well-developed and well-nourished, in no acute distress and cooperative with exam.  HEENT: Normocephalic and atraumatic. Mild erythema of both nasal turbinate, no pharyngeal edema, erythema or exudate.  No  facial tenderness . Cardiovascular: RRR, S1 normal, S2 normal, no murmurs, gallops, or rubs. Pulmonary/Chest: Clear to auscultation bilaterally, no wheezes, rales, or rhonchi. Abdominal: Soft, non-tender, non-distended, BS +, no masses, organomegaly, or guarding present.  Extremities: Trace lower extremity edema bilaterally,  pulses symmetric and intact bilaterally. No cyanosis or clubbing. Skin: Warm, dry and intact. No rashes or erythema. Psychiatric: Normal mood and affect. speech and behavior is normal. Cognition and memory are normal.  Assessment & Plan:   See Encounters Tab for problem based charting.  Patient discussed with Dr. Dareen Piano.

## 2017-10-31 NOTE — Telephone Encounter (Signed)
Pt wanted abx called in- states "I have a real bad cold, im peeing all over myself" appt given for [facility] 1515 3/28

## 2017-11-01 NOTE — Progress Notes (Signed)
Internal Medicine Clinic Attending  Case discussed with Dr. Amin at the time of the visit.  We reviewed the resident's history and exam and pertinent patient test results.  I agree with the assessment, diagnosis, and plan of care documented in the resident's note.    

## 2017-11-04 ENCOUNTER — Other Ambulatory Visit: Payer: Self-pay | Admitting: Internal Medicine

## 2017-11-14 ENCOUNTER — Other Ambulatory Visit: Payer: Self-pay | Admitting: Internal Medicine

## 2017-11-14 DIAGNOSIS — K219 Gastro-esophageal reflux disease without esophagitis: Secondary | ICD-10-CM

## 2017-11-19 ENCOUNTER — Telehealth: Payer: Self-pay

## 2017-11-19 NOTE — Telephone Encounter (Signed)
Attempted to call patient but did not pick up. I called [facility] and last dispense of Eliquis was 08/27/17 for a 30 day supply. She has no refills. Will call another time to ask her if she is getting Eliquis anywhere else or if she had stopped.

## 2017-11-25 ENCOUNTER — Other Ambulatory Visit: Payer: Self-pay | Admitting: Pharmacist

## 2017-11-25 DIAGNOSIS — I480 Paroxysmal atrial fibrillation: Secondary | ICD-10-CM

## 2017-11-25 MED ORDER — APIXABAN 5 MG PO TABS: 5.0000 mg | ORAL_TABLET | Freq: Two times a day (BID) | ORAL | 11 refills | Status: DC

## 2017-11-26 NOTE — Telephone Encounter (Signed)
Error opening  

## 2017-12-02 NOTE — Progress Notes (Signed)
HEMATOLOGY/ONCOLOGY CLINIC NOTE  Date of Service: 12/04/17  Inpatient Attending: .Brunetta Genera, MD  CC:  F/u MPN NOS chek2 mutation +ve  HPI  Patient is a very pleasant 77 year old female with a history of morbid obesity, anemia, asthma, hypertension, lumbar spinal TB in 2002 who presented with acute onset of lower back pain on Monday. She reports that she drank some pepsi and tea on that day and her back pain began shortly after. No initial injury to precipitate her back pain. It gradually worsened until she was seen in the ED on 05/30/17. Labs during admission revealed WBC count of 55.2k with predominantly neutrophilia of 48.8k and some monocytosis at 2.2k.   She had minimal anemia at 11.5 with an MCV of 92 and a normal platelet count of 163k.  At that time she had CT A/P while in the ED which was normal and without splenomegaly. Additionally, she also had MRI of the L-spine which only showed degenerative changes. She was subsequently admitted for further workup and pain control and to rule out a source of infection.   Of note, the patient has a remote prior h/o epidural mass in 2002 (hemilaminectomy and decompression of tumor in epidural space 2002) which was thought to be secondary to lumbar epidural tuberculosis. She was placed on a course of antibiotics for several months following her diagnosis.   Following her admission, we were consulted for further workup. No recent new medications. She reports that her inhaler does contain steroids, but otherwise she has not been on a course of steroids recently. She is UTD with her regular cancer screenings. She does not feel any different now than she did 40moto a year ago.  No focal symptoms suggestive of infection other than the back pain at this time.  On ROS, she denies fever, chills, night sweats, unexpected weight loss, abdominal pain, sore throat, joint swelling, or any other associated symptoms. Her back pain is much improved with  muscle relaxants. She does note some right great toe pain, but otherwise she has no other acute symptoms.   INTERVAL HISTORY:   Erika JABBOURis her for follow up of persistent leucocytosis/neutrophilia. Concerning for chronic MPN NOS with Chek2 mutation. The patient's last visit with [place] on 10/01/17. The pt reports that she is doing well overall and has been enjoying spending time recently with her great-granddaughter.   The pt reports that she has felt very well and notes no new concerns. She notes that her back pain has been dissipating and she no longer needs to take Tramadol. She denies any constitutional symptoms as noted below.   Lab results today (12/04/17) of CBC, CMP, and Reticulocytes is as follows: all values are WNL except for WBC at 34.3k, Hgb at 11.3, MCHC at 31.0, RDW at 16.5, Neutro Abs at 28.7k, Monocytes Abs at 1.4k, Eosinophils Abs at 1.0k, Total Protein at 8.7, Alk Phos at 186. LDH 12/04/17 is stable at 267. No abdominal pain or distension noted.  On review of systems, pt reports good energy levels, resolving back pain, and denies fevers, chills, night sweats, abdominal pains, swollen abdomen, changes in bowel habits, infections, skin rashes, mouth sores, new back pain, and any other symptoms.    MEDICAL HISTORY:  Past Medical History:  Diagnosis Date  . Anemia    with menses  . Asthma   . Back pain    status post surgery 2002  . Breast cyst    Excesion with FNA, begnin in  2004.   . Degenerative joint disease of spine    Imaging 2005,  Degenerative hypertrophic facet arthritis changes L4-5 and L5-S1..   . History of shingles    Recurrent with post herpetic neuralgia.   [name] Hypertension   . Lymphadenopathy    Of the mediastinum, Right side CXR 2008, not read on 2010 cxr.   . Menopause   . Obesity    BMI 54  . Psychosis ([place])    Secondary to prednisone.  . Shingles   . Stasis dermatitis    W/ LE edema, prviously on lasix now on mazxide.   . SVT  (supraventricular tachycardia) [facility]) June 2009   one run while hospitalized  . Tuberculosis    active TB treated in 2002, hx of paraspinal lumbar TB,     SURGICAL HISTORY: Past Surgical History:  Procedure Laterality Date  . BREAST CYST ASPIRATION Left   . Breast cyst biopsy    . CHOLECYSTECTOMY  02/02/2012   Procedure: LAPAROSCOPIC CHOLECYSTECTOMY;  Surgeon: Zenovia Jarred, MD;  Location: [facility];  Service: General;  Laterality: N/A;  . Hemilaminectomy of L4, L5 and S1 decompression of tumor in epidural space  2000    SOCIAL HISTORY: Social History   Socioeconomic History  . Marital status: Widowed    Spouse name: Not on file  . Number of children: Not on file  . Years of education: Not on file  . Highest education level: Not on file  Occupational History  . 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 Occupation: volunteers at [employer]  . Financial resource strain: Not on file  . Food insecurity:    Worry: Not on file    Inability: Not on file  . Transportation needs:    Medical: Not on file    Non-medical: Not on file  Tobacco Use  . Smoking status: Never Smoker  . Smokeless tobacco: Never Used  Substance and Sexual Activity  . Alcohol use: No    Alcohol/week: 0.0 oz  . Drug use: No  . Sexual activity: Never    Birth control/protection: Post-menopausal  Lifestyle  . Physical activity:    Days per week: Not on file    Minutes per session: Not on file  . Stress: Not on file  Relationships  . Social connections:    Talks on phone: Not on file    Gets together: Not on file    Attends religious service: Not on file    Active member of club or organization: Not on file    Attends meetings of clubs or organizations: Not on file    Relationship status: Not on file  . Intimate partner violence:    Fear of current or ex partner: Not on file    Emotionally abused: Not on file    Physically abused: Not on file    Forced sexual activity: Not  on file  Other Topics Concern  . Not on file  Social History Narrative   No regular exercise, retired, single, volunteers at a local school.     FAMILY HISTORY: Family History  Problem Relation Age of Onset  . Stroke Mother        at young age  . Stroke Father        in 46's  . Heart attack Father   . Stroke Brother   . Stroke Brother   . Breast cancer Other     ALLERGIES:  is allergic to prednisone.  MEDICATIONS:  . Current Outpatient Medications:  .  albuterol (PROAIR HFA) 108 (90 Base) MCG/ACT inhaler, inhale 1 to 2 puffs every 6 hours if needed, Disp: 8.5 g, Rfl: 6 .  amLODipine (NORVASC) 10 MG tablet, TAKE 1 TABLET BY MOUTH ONCE DAILY, Disp: 90 tablet, Rfl: 0 .  apixaban (ELIQUIS) 5 MG TABS tablet, Take 1 tablet (5 mg total) by mouth 2 (two) times daily., Disp: 60 tablet, Rfl: 11 .  aspirin EC 81 MG tablet, Take 81 mg by mouth daily., Disp: , Rfl:  .  dextromethorphan-guaiFENesin (MUCINEX DM) 30-600 MG 12hr tablet, Take 1 tablet by mouth 2 (two) times daily., Disp: 30 tablet, Rfl: 0 .  fexofenadine (ALLEGRA ALLERGY) 180 MG tablet, Take 1 tablet (180 mg total) by mouth daily., Disp: 30 tablet, Rfl: 2 .  fluticasone (FLONASE) 50 MCG/ACT nasal spray, Place 1 spray into both nostrils daily., Disp: 16 g, Rfl: 2 .  Fluticasone-Salmeterol (ADVAIR DISKUS) 500-50 MCG/DOSE AEPB, INHALE 1 PUFF BY MOUTH TWICE A DAY, Disp: 180 each, Rfl: 3 .  lisinopril (PRINIVIL,ZESTRIL) 5 MG tablet, take 1 tablet by mouth once daily, Disp: 30 tablet, Rfl: 6 .  metoprolol tartrate (LOPRESSOR) 25 MG tablet, Take 0.5 tablets (12.5 mg total) by mouth 2 (two) times daily., Disp: 60 tablet, Rfl: 0 .  omeprazole (PRILOSEC) 20 MG capsule, TAKE 1 CAPSULE BY MOUTH ONCE DAILY, Disp: 90 capsule, Rfl: 0 .  potassium chloride SA (K-DUR,KLOR-CON) 20 MEQ tablet, Take 0.5 tablets (10 mEq total) by mouth daily., Disp: 90 tablet, Rfl: 1 .  pravastatin (PRAVACHOL) 20 MG tablet, Take 1 tablet (20 mg total) by mouth daily.,  Disp: 90 tablet, Rfl: 3 .  sodium chloride (OCEAN) 0.65 % SOLN nasal spray, Place 1 spray into both nostrils as needed for congestion., Disp: 88 mL, Rfl: 0 .  traMADol (ULTRAM) 50 MG tablet, Take 1 tablet (50 mg total) by mouth every 12 (twelve) hours as needed., Disp: 50 tablet, Rfl: 2 .  furosemide (LASIX) 20 MG tablet, TAKE 1 TABLET BY MOUTH ONCE DAILY IF NEEDED FOR SWELLING IN LEGS OR FEET, Disp: 30 tablet, Rfl: 0   REVIEW OF SYSTEMS:   .10 Point review of Systems was done is negative except as noted above.  PHYSICAL EXAMINATION:  Vitals:   12/04/17 0926  BP: (!) 143/72  Pulse: (!) 109  Resp: 18  Temp: 98 F (36.7 C)  TempSrc: Oral  SpO2: 98%  Weight: 275 lb 6.4 oz (124.9 kg)  Height: 5' 7"  (1.702 m)   . GENERAL:alert, in no acute distress and comfortable SKIN: no acute rashes, no significant lesions EYES: conjunctiva are pink and non-injected, sclera anicteric OROPHARYNX: MMM, no exudates, no oropharyngeal erythema or ulceration NECK: supple, no JVD LYMPH:  no palpable lymphadenopathy in the cervical, axillary or inguinal regions LUNGS: clear to auscultation b/l with normal respiratory effort HEART: regular rate & rhythm ABDOMEN:  normoactive bowel sounds , non tender, not distended. No clinically palpable splenomegaly Extremity: no pedal edema PSYCH: alert & oriented x 3 with fluent speech NEURO: no focal motor/sensory deficits    LABORATORY DATA:  I have reviewed the data as listed   .       CBC Latest Ref Rng & Units 12/04/2017 10/01/2017 07/24/2017  WBC 3.9 - 10.3 K/uL 34.3(H) 43.9(H) 42.8(H)  Hemoglobin 11.6 - 15.9 g/dL 11.3(L) 11.5(L) 11.3(L)  Hematocrit 34.8 - 46.6 % 36.4 36.6 36.0  Platelets 145 - 400 K/uL 145 162 172   . CBC  Component Value Date/Time   WBC 34.3 (H) 12/04/2017 0903   WBC 42.8 (H) 07/24/2017 1402   WBC 39.2 (H) 06/24/2017 0732   RBC 4.03 12/04/2017 0903   RBC 4.03 12/04/2017 0903   HGB 11.3 (L) 12/04/2017 0903   HGB 11.3  (L) 07/24/2017 1402   HCT 36.4 12/04/2017 0903   HCT 36.0 07/24/2017 1402   PLT 145 12/04/2017 0903   PLT 172 07/24/2017 1402   MCV 90.3 12/04/2017 0903   MCV 93.3 07/24/2017 1402   MCH 28.0 12/04/2017 0903   MCHC 31.0 (L) 12/04/2017 0903   RDW 16.5 (H) 12/04/2017 0903   RDW 15.6 (H) 07/24/2017 1402   LYMPHSABS 3.1 12/04/2017 0903   LYMPHSABS 2.9 07/24/2017 1402   MONOABS 1.4 (H) 12/04/2017 0903   MONOABS 1.8 (H) 07/24/2017 1402   EOSABS 1.0 (H) 12/04/2017 0903   EOSABS 1.1 (H) 07/24/2017 1402   BASOSABS 0.1 12/04/2017 0903   BASOSABS 0.2 (H) 07/24/2017 1402   . CMP Latest Ref Rng & Units 12/04/2017 10/01/2017 07/24/2017  Glucose 70 - 140 mg/dL 87 78 85  BUN 7 - 26 mg/dL 13 13 18.0  Creatinine 0.60 - 1.10 mg/dL 1.09 0.94 1.1  Sodium 136 - 145 mmol/L 139 141 140  Potassium 3.5 - 5.1 mmol/L 4.2 3.8 4.2  Chloride 98 - 109 mmol/L 103 104 -  CO2 22 - 29 mmol/L 28 28 27   Calcium 8.4 - 10.4 mg/dL 9.7 9.3 9.2  Total Protein 6.4 - 8.3 g/dL 8.7(H) 8.3 8.3  Total Bilirubin 0.2 - 1.2 mg/dL 0.4 0.3 0.29  Alkaline Phos 40 - 150 U/L 186(H) 199(H) 202(H)  AST 5 - 34 U/L 12 13 11   ALT 0 - 55 U/L 6 9 <6    RADIOGRAPHIC STUDIES: I have personally reviewed the radiological images as listed and agreed with the findings in the report. No results found.  ASSESSMENT & PLAN:   Erika [name] is a delightful, well-appearing 77 y.o. woman with  #1 Leukocytosis?predominantly Neutrophilia with some monocytosis CMML vs MPN NOS Patient initially presented with a WBC count of 55.2k with 48.8k neutrophils.  WBC counts relatively stable improved today at 34.3k  Peripheral blood smear personally previously reviewed-shows significant neutrophilia with left shift.  Increased bands with a few myelocytes and metamyelocytes.  Pelgeroid neutrophils.  No significant increased blasts.  Monocytosis present.  Patient had significantly elevated CRP levels of 6.3 and a sed rate of 57. Pro-calcitonin level at  baseline was not impressively elevated and was at 0.23.  No overt evidence of infection source at this time blood cultures CT abdomen done for workup of her back pain did not show any acute intra-abdominal or pelvic findings. He does not have any overt splenomegaly on her CT abdomen -which is somewhat argues against a chronic myeloproliferative disorder though does not rule this out.  MRI of the lumbar spine shows old surgical and degenerative changes but no evidence of tumor or infectious collections.  Leucocytosis - improved at 34.3k today. No fevers/chills or other constitutional symptoms  Foundation One heme with Chek 2 mutation. No other findings noted.  CT chest on 05/31/2017: IMPRESSION: 1. Mild ground-glass opacities suggest mild pulmonary edema. 2. Small bilateral pleural effusions and basilar atelectasis. 3. No pulmonary infiltrate. 4. Mild subcarinal mediastinal adenopathy is likely reactive. 5. Cardiomegaly.  JAK2 and BCR-ABL mutations neg. LDH levels were within normal limits and did not suggest a high-grade lymphoma.  06/24/17 BM bx- this showed hypercellular bone marrow with granulocytic  proliferation. Cytogenetic analysis was normal.   #2 mild normocytic anemia. HG at 11.5 today   Plan  -Discussed pt labwork today 12/04/17; WBC have decreased from 43.9k to 34.3k, blood counts and chemistries are otherwise stable.  -Discussed with pt that if her WBC counts elevate, or she develops constitutional symptoms, or liver or spleen sizes increase, we will recommend Hydroxyurea. -Recommend pt stay up to date with her age appropriate cancer screenings with her PCP. -We offered to refer the pt to receive a second opinion if she would like this; she notes that she would like to wait on this for now.  -Ms Davee currently meets the criteria of an unclassifiable myeloproliferative neoplasm or MPN/MDS with a chek 2 mutation.  -Given the stability of her labs and symptoms, I will see  pt back in 3 months.     RTC with Dr Irene Limbo in 3 months with labs  . The total time spent in the appointment was 15 minutes and more than 50% was on counseling and direct patient cares.    Sullivan Lone MD [place] AAHIVMS [facility] Hematology/Oncology Physician [facility]  (Office):       [phone] (Work cell):  [phone] (Fax):           [phone]  12/04/2017 9:36 AM  This document serves as a record of services personally performed by Sullivan Lone, MD. It was created on his behalf by Baldwin [place], a trained medical scribe. The creation of this record is based on the scribe's personal observations and the provider's statements to them.   .I have reviewed the above documentation for accuracy and completeness, and I agree with the above. Brunetta Genera MD MS

## 2017-12-04 ENCOUNTER — Telehealth: Payer: Self-pay | Admitting: Hematology

## 2017-12-04 ENCOUNTER — Encounter: Payer: Self-pay | Admitting: Hematology

## 2017-12-04 ENCOUNTER — Inpatient Hospital Stay: Payer: PPO

## 2017-12-04 ENCOUNTER — Inpatient Hospital Stay: Payer: PPO | Attending: Hematology | Admitting: Hematology

## 2017-12-04 VITALS — BP 143/72 | HR 109 | Temp 98.0°F | Resp 18 | Ht 67.0 in | Wt 275.4 lb

## 2017-12-04 DIAGNOSIS — D471 Chronic myeloproliferative disease: Secondary | ICD-10-CM

## 2017-12-04 DIAGNOSIS — Z79899 Other long term (current) drug therapy: Secondary | ICD-10-CM | POA: Insufficient documentation

## 2017-12-04 DIAGNOSIS — M79674 Pain in right toe(s): Secondary | ICD-10-CM | POA: Diagnosis not present

## 2017-12-04 DIAGNOSIS — I10 Essential (primary) hypertension: Secondary | ICD-10-CM | POA: Diagnosis not present

## 2017-12-04 DIAGNOSIS — D72821 Monocytosis (symptomatic): Secondary | ICD-10-CM | POA: Diagnosis not present

## 2017-12-04 DIAGNOSIS — Z7982 Long term (current) use of aspirin: Secondary | ICD-10-CM | POA: Diagnosis not present

## 2017-12-04 DIAGNOSIS — R7982 Elevated C-reactive protein (CRP): Secondary | ICD-10-CM | POA: Insufficient documentation

## 2017-12-04 DIAGNOSIS — J45909 Unspecified asthma, uncomplicated: Secondary | ICD-10-CM | POA: Diagnosis not present

## 2017-12-04 DIAGNOSIS — Z8611 Personal history of tuberculosis: Secondary | ICD-10-CM

## 2017-12-04 DIAGNOSIS — M545 Low back pain: Secondary | ICD-10-CM | POA: Insufficient documentation

## 2017-12-04 DIAGNOSIS — D649 Anemia, unspecified: Secondary | ICD-10-CM | POA: Diagnosis not present

## 2017-12-04 LAB — CMP (CANCER CENTER ONLY)
ALK PHOS: 186 U/L — AB (ref 40–150)
ALT: 6 U/L (ref 0–55)
AST: 12 U/L (ref 5–34)
Albumin: 3.5 g/dL (ref 3.5–5.0)
Anion gap: 8 (ref 3–11)
BILIRUBIN TOTAL: 0.4 mg/dL (ref 0.2–1.2)
BUN: 13 mg/dL (ref 7–26)
CALCIUM: 9.7 mg/dL (ref 8.4–10.4)
CO2: 28 mmol/L (ref 22–29)
Chloride: 103 mmol/L (ref 98–109)
Creatinine: 1.09 mg/dL (ref 0.60–1.10)
GFR, Est AFR Am: 57 mL/min — ABNORMAL LOW (ref >60)
GFR, Estimated: 49 mL/min — ABNORMAL LOW (ref >60)
Glucose, Bld: 87 mg/dL (ref 70–140)
Potassium: 4.2 mmol/L (ref 3.5–5.1)
Sodium: 139 mmol/L (ref 136–145)
TOTAL PROTEIN: 8.7 g/dL — AB (ref 6.4–8.3)

## 2017-12-04 LAB — CBC WITH DIFFERENTIAL (CANCER CENTER ONLY)
BASOS PCT: 0 %
Basophils Absolute: 0.1 10*3/uL (ref 0.0–0.1)
EOS ABS: 1 10*3/uL — AB (ref 0.0–0.5)
Eosinophils Relative: 3 %
HEMATOCRIT: 36.4 % (ref 34.8–46.6)
Hemoglobin: 11.3 g/dL — ABNORMAL LOW (ref 11.6–15.9)
LYMPHS ABS: 3.1 10*3/uL (ref 0.9–3.3)
Lymphocytes Relative: 9 %
MCH: 28 pg (ref 25.1–34.0)
MCHC: 31 g/dL — AB (ref 31.5–36.0)
MCV: 90.3 fL (ref 79.5–101.0)
MONO ABS: 1.4 10*3/uL — AB (ref 0.1–0.9)
Monocytes Relative: 4 %
Neutro Abs: 28.7 10*3/uL — ABNORMAL HIGH (ref 1.5–6.5)
Neutrophils Relative %: 84 %
Platelet Count: 145 10*3/uL (ref 145–400)
RBC: 4.03 MIL/uL (ref 3.70–5.45)
RDW: 16.5 % — AB (ref 11.2–14.5)
WBC Count: 34.3 10*3/uL — ABNORMAL HIGH (ref 3.9–10.3)

## 2017-12-04 LAB — RETICULOCYTES
RBC.: 4.03 MIL/uL (ref 3.70–5.45)
RETIC CT PCT: 2 % (ref 0.7–2.1)
Retic Count, Absolute: 80.6 10*3/uL (ref 33.7–90.7)

## 2017-12-04 LAB — LACTATE DEHYDROGENASE: LDH: 267 U/L — ABNORMAL HIGH (ref 125–245)

## 2017-12-04 NOTE — Telephone Encounter (Signed)
Scheduled appt per 5/1 los - Gave patient AVS and calender per los.  

## 2017-12-08 ENCOUNTER — Other Ambulatory Visit: Payer: Self-pay | Admitting: Internal Medicine

## 2018-01-03 ENCOUNTER — Other Ambulatory Visit: Payer: Self-pay | Admitting: *Deleted

## 2018-01-03 DIAGNOSIS — J4530 Mild persistent asthma, uncomplicated: Secondary | ICD-10-CM

## 2018-01-03 MED ORDER — ALBUTEROL SULFATE HFA 108 (90 BASE) MCG/ACT IN AERS: INHALATION_SPRAY | RESPIRATORY_TRACT | 6 refills | Status: DC

## 2018-01-03 MED ORDER — FLUTICASONE PROPIONATE 50 MCG/ACT NA SUSP: 1.0000 | Freq: Every day | NASAL | 2 refills | Status: DC

## 2018-01-03 MED ORDER — FLUTICASONE-SALMETEROL 500-50 MCG/DOSE IN AEPB: INHALATION_SPRAY | RESPIRATORY_TRACT | 3 refills | Status: DC

## 2018-01-03 MED ORDER — METOPROLOL TARTRATE 25 MG PO TABS: 12.5000 mg | ORAL_TABLET | Freq: Two times a day (BID) | ORAL | 0 refills | Status: DC

## 2018-01-03 NOTE — Telephone Encounter (Signed)
Pt walks in requesting refills-will send to MD for review.Despina Hidden Cassady5/31/20193:38 PM

## 2018-01-31 ENCOUNTER — Other Ambulatory Visit: Payer: Self-pay | Admitting: Internal Medicine

## 2018-01-31 NOTE — Telephone Encounter (Signed)
Patient is requesting a refill on tradamol, pls send to [facility] [address]

## 2018-02-02 ENCOUNTER — Other Ambulatory Visit: Payer: Self-pay | Admitting: Oncology

## 2018-02-03 NOTE — Telephone Encounter (Signed)
Needs appointment scheduled in August

## 2018-02-14 ENCOUNTER — Ambulatory Visit (INDEPENDENT_AMBULATORY_CARE_PROVIDER_SITE_OTHER): Payer: PPO | Admitting: Internal Medicine

## 2018-02-14 ENCOUNTER — Encounter (INDEPENDENT_AMBULATORY_CARE_PROVIDER_SITE_OTHER): Payer: Self-pay

## 2018-02-14 ENCOUNTER — Other Ambulatory Visit: Payer: Self-pay

## 2018-02-14 VITALS — BP 166/86 | HR 114 | Temp 98.3°F | Ht 67.0 in | Wt 278.8 lb

## 2018-02-14 DIAGNOSIS — L03213 Periorbital cellulitis: Secondary | ICD-10-CM | POA: Diagnosis not present

## 2018-02-14 MED ORDER — CLINDAMYCIN HCL 300 MG PO CAPS: 300.0000 mg | ORAL_CAPSULE | Freq: Three times a day (TID) | ORAL | 0 refills | Status: AC

## 2018-02-14 NOTE — Assessment & Plan Note (Signed)
2 day h/o red, swollen, tender area starting on right side of nose and spreading to right cheek/upper lip. Denies symptoms of systemic infection. No h/o diabetes.   Plan: - clindamycin 300mg  TID for 7 days - f/u prn if symptoms not improving

## 2018-02-14 NOTE — Progress Notes (Signed)
   CC: swollen face   HPI:  [name] is a 77 y.o. with 2 day h/o swollen face. She first felt a bump on the left side of her nose. Now her left cheek, top lip,and nose are swollen. Reports mild tenderness. No alleviating or worsening factors.   Denies bloody nose, sob, chest, fevers, chills, diarrhea, urinary issues, headache, sore throat, vision changes   Past Medical History:  Diagnosis Date  . Anemia    with menses  . Asthma   . Back pain    status post surgery 2002  . Breast cyst    Excesion with FNA, begnin in 2004.   . Degenerative joint disease of spine    Imaging 2005,  Degenerative hypertrophic facet arthritis changes L4-5 and L5-S1..   . History of shingles    Recurrent with post herpetic neuralgia.   [name] Hypertension   . Lymphadenopathy    Of the mediastinum, Right side CXR 2008, not read on 2010 cxr.   . Menopause   . Obesity    BMI 54  . Psychosis ([place])    Secondary to prednisone.  . Shingles   . Stasis dermatitis    W/ LE edema, prviously on lasix now on mazxide.   . SVT (supraventricular tachycardia) [facility]) June 2009   one run while hospitalized  . Tuberculosis    active TB treated in 2002, hx of paraspinal lumbar TB,     Physical Exam:  Vitals:   02/14/18 1558  BP: (!) 166/86  Pulse: (!) 114  Temp: 98.3 F (36.8 C)  TempSrc: Oral  SpO2: 97%  Weight: 278 lb 12.8 oz (126.5 kg)  Height: 5\' 7"  (1.702 m)   Gen: Well appearing, NAD Head: right cheek/nose swollen, red, warm, tender ENT: OP clear without erythema or exudate. Good dentition. Nasal mucosa normal, small, nonobstructing growth noted in right nare Neck: + cervical LAD  Ext: Warm, no edema, normal joints Skin:  No rashes      Assessment & Plan:   See Encounters Tab for problem based charting.  Patient seen with Dr. Dareen Piano

## 2018-02-14 NOTE — Patient Instructions (Addendum)
It was nice seeing you today. Thank you for choosing [facility] for your Primary Care.   Today we talked about:  1) Eye infection: Please pick up the antibiotic Clindamycin from the pharmacy. You will take this medicine three times a day for the next 7 days. This medicine can cause diarrhea so please let us know if you start experiencing that. You can take this medicine with food or on an empty stomach.   If your symptoms are not improving by next week, please give [place] a call. Worrisome signs include trouble seeing or moving your eyes, worsening pain, redness, or swelling, fevers, chills, abdominal pain, diarrhea.    FOLLOW-UP INSTRUCTIONS When: as needed For: swollen face  Please contact the clinic if you have any problems, or need to be seen sooner.

## 2018-02-17 NOTE — Progress Notes (Signed)
Internal Medicine Clinic Attending  I saw and evaluated the patient.  I personally confirmed the key portions of the history and exam documented by Dr. Donne Hazel and I reviewed pertinent patient test results.  The assessment, diagnosis, and plan were formulated together and I agree with the documentation in the resident's note.  Patient was noted to have swelling of the right side of her face and had increased local warmth on exam. Her EOM were intact and she denied any blurry vision or pain. No fevers noted. Will try patient on oral clindamycin for a 7 day course for facial cellulitis. Instructed her on warning signs if she worsens and explained that she would need to go to the ED if she develops worsening symptoms. Will follow up closely in [facility]

## 2018-02-21 ENCOUNTER — Other Ambulatory Visit: Payer: Self-pay | Admitting: Internal Medicine

## 2018-02-21 DIAGNOSIS — K219 Gastro-esophageal reflux disease without esophagitis: Secondary | ICD-10-CM

## 2018-02-21 NOTE — Telephone Encounter (Signed)
Next appt scheduled 8/7 with PCP.

## 2018-02-28 ENCOUNTER — Telehealth: Payer: Self-pay | Admitting: Internal Medicine

## 2018-03-04 ENCOUNTER — Other Ambulatory Visit: Payer: Self-pay | Admitting: Internal Medicine

## 2018-03-05 NOTE — Progress Notes (Signed)
HEMATOLOGY/ONCOLOGY CLINIC NOTE  Date of Service: 03/06/18    Inpatient Attending: .Brunetta Genera, MD  CC:  F/u MPN NOS chek2 mutation +ve  HPI  Patient is a very pleasant 77 year old female with a history of morbid obesity, anemia, asthma, hypertension, lumbar spinal TB in 2002 who presented with acute onset of lower back pain on Monday. She reports that she drank some pepsi and tea on that day and her back pain began shortly after. No initial injury to precipitate her back pain. It gradually worsened until she was seen in the ED on 05/30/17. Labs during admission revealed WBC count of 55.2k with predominantly neutrophilia of 48.8k and some monocytosis at 2.2k.   She had minimal anemia at 11.5 with an MCV of 92 and a normal platelet count of 163k.  At that time she had CT A/P while in the ED which was normal and without splenomegaly. Additionally, she also had MRI of the L-spine which only showed degenerative changes. She was subsequently admitted for further workup and pain control and to rule out a source of infection.   Of note, the patient has a remote prior h/o epidural mass in 2002 (hemilaminectomy and decompression of tumor in epidural space 2002) which was thought to be secondary to lumbar epidural tuberculosis. She was placed on a course of antibiotics for several months following her diagnosis.   Following her admission, we were consulted for further workup. No recent new medications. She reports that her inhaler does contain steroids, but otherwise she has not been on a course of steroids recently. She is UTD with her regular cancer screenings. She does not feel any different now than she did 17moto a year ago.  No focal symptoms suggestive of infection other than the back pain at this time.  On ROS, she denies fever, chills, night sweats, unexpected weight loss, abdominal pain, sore throat, joint swelling, or any other associated symptoms. Her back pain is much improved  with muscle relaxants. She does note some right great toe pain, but otherwise she has no other acute symptoms.   INTERVAL HISTORY:   [name] her for follow up of persistent leucocytosis/neutrophilia. Concerning for chronic MPN NOS with Chek2 mutation. The patient's last visit with [facility] on 12/04/17. The pt reports that she is doing well overall.   The pt reports that she has not developed any new concerns. She has lightly used her steroid inhaler for her asthma in the interim. She continues on Eliquis for her Afib and denies any medication changes.   She notes that she had a boil in her nose and was treated with antibiotics in the last two weeks. She denies any other infections in the interim.   Lab results today (03/06/18) of CBC w/diff, CMP, and Reticulocytes is as follows: all values are WNL except for WBC at 48.2, HGB at 11.4, RDW at 16.4, ANC at 40.2k, Lymphs abs at 3.8k, Monocytes abs at 2.0k, Eosinophils abs at 2.1k, Creatinine at 1.04, Total Protein at 8.4, AST at 13, Alk Phos at 221, GFR at 59.  On review of systems, pt reports recent bacterial infection, stable energy levels, and denies skin rashes, abdominal pains, back pains, headaches, leg swelling, and any other symptoms.    MEDICAL HISTORY:  Past Medical History:  Diagnosis Date  . Anemia    with menses  . Asthma   . Back pain    status post surgery 2002  . Breast cyst  Excesion with FNA, begnin in 2004.   . Degenerative joint disease of spine    Imaging 2005,  Degenerative hypertrophic facet arthritis changes L4-5 and L5-S1..   . History of shingles    Recurrent with post herpetic neuralgia.   [name] Hypertension   . Lymphadenopathy    Of the mediastinum, Right side CXR 2008, not read on 2010 cxr.   . Menopause   . Obesity    BMI 54  . Psychosis ([place])    Secondary to prednisone.  . Shingles   . Stasis dermatitis    W/ LE edema, prviously on lasix now on mazxide.   . SVT (supraventricular tachycardia) [facility])  June 2009   one run while hospitalized  . Tuberculosis    active TB treated in 2002, hx of paraspinal lumbar TB,     SURGICAL HISTORY: Past Surgical History:  Procedure Laterality Date  . BREAST CYST ASPIRATION Left   . Breast cyst biopsy    . CHOLECYSTECTOMY  02/02/2012   Procedure: LAPAROSCOPIC CHOLECYSTECTOMY;  Surgeon: Zenovia Jarred, MD;  Location: [place];  Service: General;  Laterality: N/A;  . Hemilaminectomy of L4, L5 and S1 decompression of tumor in epidural space  2000    SOCIAL HISTORY: Social History   Socioeconomic History  . Marital status: Widowed    Spouse name: Not on file  . Number of children: Not on file  . Years of education: Not on file  . Highest education level: Not on file  Occupational History  . 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 Occupation: volunteers at [employer]  . Financial resource strain: Not on file  . Food insecurity:    Worry: Not on file    Inability: Not on file  . Transportation needs:    Medical: Not on file    Non-medical: Not on file  Tobacco Use  . Smoking status: Never Smoker  . Smokeless tobacco: Never Used  Substance and Sexual Activity  . Alcohol use: No    Alcohol/week: 0.0 oz  . Drug use: No  . Sexual activity: Never    Birth control/protection: Post-menopausal  Lifestyle  . Physical activity:    Days per week: Not on file    Minutes per session: Not on file  . Stress: Not on file  Relationships  . Social connections:    Talks on phone: Not on file    Gets together: Not on file    Attends religious service: Not on file    Active member of club or organization: Not on file    Attends meetings of clubs or organizations: Not on file    Relationship status: Not on file  . Intimate partner violence:    Fear of current or ex partner: Not on file    Emotionally abused: Not on file    Physically abused: Not on file    Forced sexual activity: Not on file  Other Topics Concern  .  Not on file  Social History Narrative   No regular exercise, retired, single, volunteers at a local school.     FAMILY HISTORY: Family History  Problem Relation Age of Onset  . Stroke Mother        at young age  . Stroke Father        in 74's  . Heart attack Father   . Stroke Brother   . Stroke Brother   . Breast cancer Other     ALLERGIES:  is allergic to prednisone.  MEDICATIONS:  . Current Outpatient Medications:  .  albuterol (PROAIR HFA) 108 (90 Base) MCG/ACT inhaler, inhale 1 to 2 puffs every 6 hours if needed, Disp: 8.5 g, Rfl: 6 .  amLODipine (NORVASC) 10 MG tablet, TAKE 1 TABLET BY MOUTH ONCE DAILY, Disp: 90 tablet, Rfl: 0 .  apixaban (ELIQUIS) 5 MG TABS tablet, Take 1 tablet (5 mg total) by mouth 2 (two) times daily., Disp: 60 tablet, Rfl: 11 .  aspirin EC 81 MG tablet, Take 81 mg by mouth daily., Disp: , Rfl:  .  dextromethorphan-guaiFENesin (MUCINEX DM) 30-600 MG 12hr tablet, Take 1 tablet by mouth 2 (two) times daily., Disp: 30 tablet, Rfl: 0 .  fexofenadine (ALLEGRA ALLERGY) 180 MG tablet, Take 1 tablet (180 mg total) by mouth daily., Disp: 30 tablet, Rfl: 2 .  fluticasone (FLONASE) 50 MCG/ACT nasal spray, Place 1 spray into both nostrils daily., Disp: 16 g, Rfl: 2 .  Fluticasone-Salmeterol (ADVAIR DISKUS) 500-50 MCG/DOSE AEPB, INHALE 1 PUFF BY MOUTH TWICE A DAY, Disp: 180 each, Rfl: 3 .  furosemide (LASIX) 20 MG tablet, TAKE 1 TABLET BY MOUTH ONCE DAILY IF NEEDED FOR SWELLING IN LEGS OR FEET, Disp: 30 tablet, Rfl: 0 .  lisinopril (PRINIVIL,ZESTRIL) 5 MG tablet, take 1 tablet by mouth once daily, Disp: 30 tablet, Rfl: 6 .  metoprolol tartrate (LOPRESSOR) 25 MG tablet, TAKE 1/2 TABLET(12.5 MG) BY MOUTH TWICE DAILY, Disp: 60 tablet, Rfl: 6 .  omeprazole (PRILOSEC) 20 MG capsule, TAKE 1 CAPSULE BY MOUTH EVERY DAY, Disp: 90 capsule, Rfl: 0 .  potassium chloride SA (K-DUR,KLOR-CON) 20 MEQ tablet, Take 0.5 tablets (10 mEq total) by mouth daily., Disp: 90 tablet, Rfl: 1 .   pravastatin (PRAVACHOL) 20 MG tablet, Take 1 tablet (20 mg total) by mouth daily., Disp: 90 tablet, Rfl: 3 .  sodium chloride (OCEAN) 0.65 % SOLN nasal spray, Place 1 spray into both nostrils as needed for congestion., Disp: 88 mL, Rfl: 0 .  traMADol (ULTRAM) 50 MG tablet, TAKE 1 TABLET(50 MG) BY MOUTH EVERY 12 HOURS AS NEEDED, Disp: 50 tablet, Rfl: 0   REVIEW OF SYSTEMS:   A 10+ POINT REVIEW OF SYSTEMS WAS OBTAINED including neurology, dermatology, psychiatry, cardiac, respiratory, lymph, extremities, GI, GU, Musculoskeletal, constitutional, breasts, reproductive, HEENT.  All pertinent positives are noted in the HPI.  All others are negative.   PHYSICAL EXAMINATION:  Vitals:   03/06/18 0959  BP: 134/78  Pulse: 99  Resp: 18  Temp: 98.4 F (36.9 C)  TempSrc: Oral  SpO2: 97%  Weight: 276 lb 14.4 oz (125.6 kg)  Height: 5' 7"  (1.702 m)    GENERAL:alert, in no acute distress and comfortable SKIN: no acute rashes, no significant lesions EYES: conjunctiva are pink and non-injected, sclera anicteric OROPHARYNX: MMM, no exudates, no oropharyngeal erythema or ulceration NECK: supple, no JVD LYMPH:  no palpable lymphadenopathy in the cervical, axillary or inguinal regions LUNGS: clear to auscultation b/l with normal respiratory effort HEART: regular rate & rhythm ABDOMEN:  normoactive bowel sounds , non tender, not distended. No palpable hepatosplenomegaly.  Extremity: no pedal edema PSYCH: alert & oriented x 3 with fluent speech NEURO: no focal motor/sensory deficits    LABORATORY DATA:  I have reviewed the data as listed   .       CBC Latest Ref Rng & Units 03/06/2018 12/04/2017 10/01/2017  WBC 3.9 - 10.3 K/uL 48.2(H) 34.3(H) 43.9(H)  Hemoglobin 11.6 - 15.9 g/dL 11.4(L) 11.3(L) 11.5(L)  Hematocrit 34.8 -  46.6 % 35.8 36.4 36.6  Platelets 145 - 400 K/uL 166 145 162  ANC 40.2k . CBC    Component Value Date/Time   WBC 48.2 (H) 03/06/2018 0910   WBC 42.8 (H) 07/24/2017 1402    WBC 39.2 (H) 06/24/2017 0732   RBC 3.94 03/06/2018 0910   RBC 3.94 03/06/2018 0910   HGB 11.4 (L) 03/06/2018 0910   HGB 11.3 (L) 07/24/2017 1402   HCT 35.8 03/06/2018 0910   HCT 36.0 07/24/2017 1402   PLT 166 03/06/2018 0910   PLT 172 07/24/2017 1402   MCV 90.9 03/06/2018 0910   MCV 93.3 07/24/2017 1402   MCH 28.9 03/06/2018 0910   MCHC 31.8 03/06/2018 0910   RDW 16.4 (H) 03/06/2018 0910   RDW 15.6 (H) 07/24/2017 1402   LYMPHSABS 3.8 (H) 03/06/2018 0910   LYMPHSABS 2.9 07/24/2017 1402   MONOABS 2.0 (H) 03/06/2018 0910   MONOABS 1.8 (H) 07/24/2017 1402   EOSABS 2.1 (H) 03/06/2018 0910   EOSABS 1.1 (H) 07/24/2017 1402   BASOSABS 0.1 03/06/2018 0910   BASOSABS 0.2 (H) 07/24/2017 1402   . CMP Latest Ref Rng & Units 03/06/2018 12/04/2017 10/01/2017  Glucose 70 - 99 mg/dL 82 87 78  BUN 8 - 23 mg/dL 18 13 13   Creatinine 0.44 - 1.00 mg/dL 1.04(H) 1.09 0.94  Sodium 135 - 145 mmol/L 141 139 141  Potassium 3.5 - 5.1 mmol/L 4.1 4.2 3.8  Chloride 98 - 111 mmol/L 103 103 104  CO2 22 - 32 mmol/L 29 28 28   Calcium 8.9 - 10.3 mg/dL 9.6 9.7 9.3  Total Protein 6.5 - 8.1 g/dL 8.4(H) 8.7(H) 8.3  Total Bilirubin 0.3 - 1.2 mg/dL 0.3 0.4 0.3  Alkaline Phos 38 - 126 U/L 221(H) 186(H) 199(H)  AST 15 - 41 U/L 13(L) 12 13  ALT 0 - 44 U/L 10 6 9     RADIOGRAPHIC STUDIES: I have personally reviewed the radiological images as listed and agreed with the findings in the report. No results found.  ASSESSMENT & PLAN:   Erika [name] is a delightful, well-appearing 76 y.o. woman with   #1 Leukocytosis?predominantly Neutrophilia with some monocytosis CMML vs MPN NOS Patient initially presented with a WBC count of 55.2k with 48.8k neutrophils.  WBC counts relatively stable improved today at 34.3k  Peripheral blood smear personally previously reviewed-shows significant neutrophilia with left shift.  Increased bands with a few myelocytes and metamyelocytes.  Pelgeroid neutrophils.  No significant  increased blasts.  Monocytosis present.  Patient had significantly elevated CRP levels of 6.3 and a sed rate of 57. Pro-calcitonin level at baseline was not impressively elevated and was at 0.23.  No overt evidence of infection source at this time blood cultures CT abdomen done for workup of her back pain did not show any acute intra-abdominal or pelvic findings. He does not have any overt splenomegaly on her CT abdomen -which is somewhat argues against a chronic myeloproliferative disorder though does not rule this out.  MRI of the lumbar spine shows old surgical and degenerative changes but no evidence of tumor or infectious collections.  Leucocytosis - improved at 34.3k today. No fevers/chills or other constitutional symptoms  Foundation One heme with Chek 2 mutation. No other findings noted.  CT chest on 05/31/2017: IMPRESSION: 1. Mild ground-glass opacities suggest mild pulmonary edema. Small bilateral pleural effusions and basilar atelectasis. No pulmonary infiltrate. Mild subcarinal mediastinal adenopathy is likely reactive. Cardiomegaly.  JAK2 and BCR-ABL mutations neg. LDH levels were within normal  limits and did not suggest a high-grade lymphoma.  06/24/17 BM bx- this showed hypercellular bone marrow with granulocytic proliferation. Cytogenetic analysis was normal.   #2 mild normocytic anemia. HGb at 11.4 today   PLAN:  -Discussed with pt that if her WBC counts increase significantly, or she develops constitutional symptoms, or liver or spleen sizes increase, we will recommend Hydroxyurea. -Recommend pt stay up to date with her age appropriate cancer screenings with her PCP. -We offered to refer the pt to receive a second opinion if she would like this; she notes that she would like to wait on this for now.  -Erika [name] currently meets the criteria of an unclassifiable myeloproliferative neoplasm or MPN/MDS with a chek 2 mutation.  -Discussed pt labwork today, 03/06/18; ANC  increased to 40.2k, all other blood counts and chemistries are stable -Pt has had a bacterial infection in the last two weeks which has symptomatically resolved after receiving a course of antibiotics, explaining interval increase in [place] -No indication for Hydroxyurea at this time as pt is quite clinically stable -Will continue regular clinical and lab observation  -Will see pt back in 4 months unless pt develops any new concerns before then    RTC with Dr Irene Limbo with labs in 4 months   The total time spent in the appt was 20 minutes and more than 50% was on counseling and direct patient cares.    Sullivan Lone MD Erika AAHIVMS [facility] Hematology/Oncology Physician [facility]  (Office):       [phone] (Work cell):  [phone] (Fax):           [phone]  03/06/2018 10:51 AM  I, Baldwin [place], am acting as a scribe for Dr. Irene Limbo  .I have reviewed the above documentation for accuracy and completeness, and I agree with the above. Brunetta Genera MD

## 2018-03-06 ENCOUNTER — Inpatient Hospital Stay: Payer: PPO

## 2018-03-06 ENCOUNTER — Telehealth: Payer: Self-pay

## 2018-03-06 ENCOUNTER — Inpatient Hospital Stay: Payer: PPO | Attending: Hematology | Admitting: Hematology

## 2018-03-06 VITALS — BP 134/78 | HR 99 | Temp 98.4°F | Resp 18 | Ht 67.0 in | Wt 276.9 lb

## 2018-03-06 DIAGNOSIS — R7982 Elevated C-reactive protein (CRP): Secondary | ICD-10-CM | POA: Diagnosis not present

## 2018-03-06 DIAGNOSIS — D469 Myelodysplastic syndrome, unspecified: Secondary | ICD-10-CM

## 2018-03-06 DIAGNOSIS — D471 Chronic myeloproliferative disease: Secondary | ICD-10-CM

## 2018-03-06 DIAGNOSIS — D72828 Other elevated white blood cell count: Secondary | ICD-10-CM

## 2018-03-06 LAB — CBC WITH DIFFERENTIAL (CANCER CENTER ONLY)
BASOS ABS: 0.1 10*3/uL (ref 0.0–0.1)
Basophils Relative: 0 %
Eosinophils Absolute: 2.1 10*3/uL — ABNORMAL HIGH (ref 0.0–0.5)
Eosinophils Relative: 4 %
HEMATOCRIT: 35.8 % (ref 34.8–46.6)
Hemoglobin: 11.4 g/dL — ABNORMAL LOW (ref 11.6–15.9)
LYMPHS ABS: 3.8 10*3/uL — AB (ref 0.9–3.3)
LYMPHS PCT: 8 %
MCH: 28.9 pg (ref 25.1–34.0)
MCHC: 31.8 g/dL (ref 31.5–36.0)
MCV: 90.9 fL (ref 79.5–101.0)
Monocytes Absolute: 2 10*3/uL — ABNORMAL HIGH (ref 0.1–0.9)
Monocytes Relative: 4 %
NEUTROS PCT: 84 %
Neutro Abs: 40.2 10*3/uL — ABNORMAL HIGH (ref 1.5–6.5)
Platelet Count: 166 10*3/uL (ref 145–400)
RBC: 3.94 MIL/uL (ref 3.70–5.45)
RDW: 16.4 % — ABNORMAL HIGH (ref 11.2–14.5)
WBC Count: 48.2 10*3/uL — ABNORMAL HIGH (ref 3.9–10.3)

## 2018-03-06 LAB — RETICULOCYTES
RBC.: 3.94 MIL/uL (ref 3.70–5.45)
Retic Count, Absolute: 51.2 10*3/uL (ref 33.7–90.7)
Retic Ct Pct: 1.3 % (ref 0.7–2.1)

## 2018-03-06 LAB — CMP (CANCER CENTER ONLY)
ALT: 10 U/L (ref 0–44)
ANION GAP: 9 (ref 5–15)
AST: 13 U/L — ABNORMAL LOW (ref 15–41)
Albumin: 3.5 g/dL (ref 3.5–5.0)
Alkaline Phosphatase: 221 U/L — ABNORMAL HIGH (ref 38–126)
BUN: 18 mg/dL (ref 8–23)
CHLORIDE: 103 mmol/L (ref 98–111)
CO2: 29 mmol/L (ref 22–32)
Calcium: 9.6 mg/dL (ref 8.9–10.3)
Creatinine: 1.04 mg/dL — ABNORMAL HIGH (ref 0.44–1.00)
GFR, EST AFRICAN AMERICAN: 59 mL/min — AB (ref >60)
GFR, Estimated: 51 mL/min — ABNORMAL LOW (ref >60)
Glucose, Bld: 82 mg/dL (ref 70–99)
POTASSIUM: 4.1 mmol/L (ref 3.5–5.1)
Sodium: 141 mmol/L (ref 135–145)
Total Bilirubin: 0.3 mg/dL (ref 0.3–1.2)
Total Protein: 8.4 g/dL — ABNORMAL HIGH (ref 6.5–8.1)

## 2018-03-06 LAB — LACTATE DEHYDROGENASE: LDH: 245 U/L — ABNORMAL HIGH (ref 98–192)

## 2018-03-06 NOTE — Telephone Encounter (Signed)
Printed avs and calender of upcoming appointment. Per 8/1 los 

## 2018-03-12 ENCOUNTER — Encounter: Payer: Self-pay | Admitting: Internal Medicine

## 2018-03-12 ENCOUNTER — Other Ambulatory Visit: Payer: Self-pay

## 2018-03-12 ENCOUNTER — Ambulatory Visit (INDEPENDENT_AMBULATORY_CARE_PROVIDER_SITE_OTHER): Payer: PPO | Admitting: Internal Medicine

## 2018-03-12 VITALS — BP 127/61 | HR 88 | Temp 98.0°F | Ht 66.0 in | Wt 285.7 lb

## 2018-03-12 DIAGNOSIS — R609 Edema, unspecified: Secondary | ICD-10-CM

## 2018-03-12 DIAGNOSIS — Z79899 Other long term (current) drug therapy: Secondary | ICD-10-CM

## 2018-03-12 DIAGNOSIS — I480 Paroxysmal atrial fibrillation: Secondary | ICD-10-CM | POA: Diagnosis not present

## 2018-03-12 DIAGNOSIS — Z7901 Long term (current) use of anticoagulants: Secondary | ICD-10-CM

## 2018-03-12 DIAGNOSIS — E876 Hypokalemia: Secondary | ICD-10-CM

## 2018-03-12 DIAGNOSIS — E7800 Pure hypercholesterolemia, unspecified: Secondary | ICD-10-CM | POA: Diagnosis not present

## 2018-03-12 DIAGNOSIS — A1801 Tuberculosis of spine: Secondary | ICD-10-CM | POA: Diagnosis not present

## 2018-03-12 DIAGNOSIS — D649 Anemia, unspecified: Secondary | ICD-10-CM | POA: Diagnosis not present

## 2018-03-12 DIAGNOSIS — I10 Essential (primary) hypertension: Secondary | ICD-10-CM

## 2018-03-12 DIAGNOSIS — M1711 Unilateral primary osteoarthritis, right knee: Secondary | ICD-10-CM | POA: Diagnosis not present

## 2018-03-12 DIAGNOSIS — D126 Benign neoplasm of colon, unspecified: Secondary | ICD-10-CM | POA: Diagnosis not present

## 2018-03-12 DIAGNOSIS — Z6841 Body Mass Index (BMI) 40.0 and over, adult: Secondary | ICD-10-CM

## 2018-03-12 DIAGNOSIS — K219 Gastro-esophageal reflux disease without esophagitis: Secondary | ICD-10-CM

## 2018-03-12 DIAGNOSIS — M479 Spondylosis, unspecified: Secondary | ICD-10-CM

## 2018-03-12 DIAGNOSIS — D72829 Elevated white blood cell count, unspecified: Secondary | ICD-10-CM | POA: Diagnosis not present

## 2018-03-12 DIAGNOSIS — M7989 Other specified soft tissue disorders: Secondary | ICD-10-CM

## 2018-03-12 DIAGNOSIS — Z0000 Encounter for general adult medical examination without abnormal findings: Secondary | ICD-10-CM

## 2018-03-12 DIAGNOSIS — E66813 Obesity, class 3: Secondary | ICD-10-CM

## 2018-03-12 MED ORDER — TRAMADOL HCL 50 MG PO TABS: 50.0000 mg | ORAL_TABLET | Freq: Two times a day (BID) | ORAL | 2 refills | Status: DC | PRN

## 2018-03-12 MED ORDER — FUROSEMIDE 20 MG PO TABS: 20.0000 mg | ORAL_TABLET | Freq: Every day | ORAL | 6 refills | Status: DC | PRN

## 2018-03-12 NOTE — Assessment & Plan Note (Signed)
She has had no issues with this in quite some time.  She does have chronic OA associated with recovery from this and is on chronic tramadol.  She has no red flag symptoms for misuse.

## 2018-03-12 NOTE — Assessment & Plan Note (Signed)
BP well controlled today at 127/61.  She is on amlodipine, lasix, lisinopril, metoprolol with good results and no side effects.  Pulse today is 88.  She has no headaches, chest pain, blurry vision.   Plan Continue 4 drug regimen Continue KCL supplementation with lasix.

## 2018-03-12 NOTE — Patient Instructions (Signed)
Ms. Filion - -  I am glad you are doing so well!  I would recommend you see GI for a colonoscopy.   I would recommend you consider getting the Shingrix vaccine, more information below.    Please come back to see me in 4-6 months.   Thank you!  Live Zoster (Shingles) Vaccine, ZVL: What You Need to Know 1. What is shingles? Shingles (also called herpes zoster, or just zoster) is a painful skin rash, often with blisters. Shingles is caused by the varicella zoster virus, the same virus that causes chickenpox. After you have chickenpox, the virus stays in your body and can cause shingles later in life. You can't catch shingles from another person. However, a person who has never had chickenpox (or chickenpox vaccine) could get chickenpox from someone with shingles. A shingles rash usually appears on one side of the face or body and heals within 2 to 4 weeks. Its main symptom is pain, which can be severe. Other symptoms can include fever, headache, chills, and upset stomach. Very rarely, a shingles infection can lead to pneumonia, hearing problems, blindness, brain inflammation (encephalitis), or death. For about 1 person in 5, severe pain can continue even long after the rash has cleared up. This long-lasting pain is called post-herpetic neuralgia (PHN). Shingles is far more common in people 73 years of age and older than in younger people, and the risk increases with age. It is also more common in people whose immune system is weakened because of a disease such as cancer or by drugs such as steroids or chemotherapy. At least 1 million people a year in the [place] get shingles. 2. Shingles vaccine (live) A live shingles vaccine was approved by FDA in 2006. In a clinical trial, the vaccine reduced the risk of shingles by about 50% in people 60 and older. It can reduce the likelihood of PHN, and reduce pain in some people who still get shingles after being vaccinated. The recommended schedule  for live shingles vaccine is a single dose for adults 78 years of age and older. 3. Some people should not get this vaccine Tell your vaccine provider if you:  Have any severe, life-threatening allergies. A person who has ever had a life-threatening allergic reaction after a dose of live shingles vaccine, or has a severe allergy to any component of this vaccine, may be advised not to be vaccinated. Ask your health care provider if you want information about vaccine components.  Are pregnant, or think you might be pregnant. Pregnant women should wait to get live shingles vaccine until they are no longer pregnant. Women should avoid getting pregnant for at least 1 month after getting shingles vaccine.  Have a weakened immune system due to disease (such as cancer or AIDS) or medical treatments (such as radiation, immunotherapy, high-dose steroids, or chemotherapy).  Are not feeling well. If you have a mild illness, such as a cold, you can probably get the vaccine today. If you are moderately or severely ill, you should probably wait until you recover. Your doctor can advise you.  4. Risks of a vaccine reaction With any medicine, including vaccines, there is a chance of reactions. After live shingles vaccination, a person might experience:  Redness, soreness, swelling, or itching at the site of the injection  Headache  These events are usually mild and go away on their own. Rarely, live shingles vaccine can cause rash or shingles. Other things that could happen after this vaccine:  People  sometimes faint after medical procedures, including vaccination. Sitting or lying down for about 15 minutes can help prevent fainting and injuries caused by a fall. Tell your provider if you feel dizzy or have vision changes or ringing in the ears.  Some people get shoulder pain that can be more severe and longer-lasting than routine soreness that can follow injections. This happens very rarely.  Any  medication can cause a severe allergic reaction. Such reactions to a vaccine are estimated at about 1 in a million doses, and would happen within a few minutes to a few hours after the vaccination. As with any medicine, there is a very remote chance of a vaccine causing a serious injury or death. The safety of vaccines is always being monitored. For more information, visit: http://www.aguilar.org/ 5. What if there is a serious problem? What should I look for?  Look for anything that concerns you, such as signs of a severe allergic reaction, very high fever, or unusual behavior. Signs of a severe allergic reaction can include hives, swelling of the face and throat, difficulty breathing, a fast heartbeat, dizziness, and weakness. These would usually start a few minutes to a few hours after the vaccination. What should I do?  If you think it is a severe allergic reaction or other emergency that can't wait, call 9-1-1 and get to the nearest hospital. Otherwise, call your health care provider.  Afterward, the reaction should be reported to the Vaccine Adverse Event Reporting System (VAERS). Your doctor should file this report, or you can do it yourself through the VAERS web site at www.vaers.SamedayNews.es or by calling (831) 280-5639. VAERS does not give medical advice. 6. How can I learn more?  Ask your healthcare provider. He or she can give you the vaccine package insert or suggest other sources of information.  Call your local or state health department.  Contact the Centers for Disease Control and Prevention (CDC): ? Call 734-233-7592(1-800-CDC-INFO) or ? Visit CDC's website at http://hunter.com/ [employer] (VIS) Live Zoster Vaccine (09/17/2016) This information is not intended to replace advice given to you by your health care provider. Make sure you discuss any questions you have with your health care provider. Document Released: 05/20/2006 Document Revised:  10/02/2016 Document Reviewed: 10/02/2016 Elsevier Interactive Patient Education  [employer].

## 2018-03-12 NOTE — Assessment & Plan Note (Signed)
Well controlled on prilosec.  Consider de-escalating therapy in the future.   Plan Continue prilosec.

## 2018-03-12 NOTE — Assessment & Plan Note (Signed)
Last LDL was 65 on check.  She is on pravastatin.  Check fasting lipid panel at next blood draw.

## 2018-03-12 NOTE — Assessment & Plan Note (Signed)
We discussed this today. She has been gaining weight due to not being active enough.  She does care for her grandchildren, but does not feel safe taking them out to the pool or gym with her.  She reports that she will be moving soon and is hopeful that she can spend more time outdoors in her new place.  She is aware of how she needs to cut back on calories and declines nutrition consult today.   Plan Increase physical activity as she can.

## 2018-03-12 NOTE — Assessment & Plan Note (Signed)
This is chronic and intermittent.  She has a TTE from 2018 which shows mild LVH only.  She does well on PRN lasix and will continue this.

## 2018-03-12 NOTE — Assessment & Plan Note (Signed)
She is doing well today.  She does have chronic pain and does not ever have complete cessation of pain.  She takes tramadol to allow her to walk and do chores and to care for 3 grandchildren.  [place] narcotic database is appropriate when checked today.   Plan Continue PRN tramadol.

## 2018-03-12 NOTE — Progress Notes (Signed)
   Subjective:    Patient ID: Erika [name], female    DOB: 08/22/1940, 77 y.o.   MRN: [id number]  HPI  Doing well, no compaints today.  We discussed her obesity, plan to exercise more.  She is taking care of 3 Great grandbabies  Discussed colonoscpy - referal for repeat if needed  Discussed shingrix at length today.  She has a history of shingles and we reviewed the CDC recommendation to get the vaccine if you have history of shingles.  I gave her information on the vaccine and she will consider.   Chronic issues are stable. BP is well controlled today.  HR is 88 and she is on metoprolol and eliquis for Afib.  She has recovered from her preorbital cellulitis well.    Review of Systems  Constitutional: Negative for activity change, appetite change and unexpected weight change.  Eyes: Negative for photophobia, pain and visual disturbance.  Respiratory: Negative for cough and shortness of breath.   Cardiovascular: Positive for leg swelling (occasional, worse at night). Negative for chest pain and palpitations.  Gastrointestinal: Negative for diarrhea and nausea.  Musculoskeletal: Positive for arthralgias and gait problem. Negative for joint swelling.  Neurological: Negative for dizziness, weakness and headaches.       Objective:   Physical Exam  Constitutional: She is oriented to person, place, and time. She appears well-developed and well-nourished. No distress.  Obese  HENT:  Head: Normocephalic and atraumatic.  Eyes: Pupils are equal, round, and reactive to light. EOM are normal. No scleral icterus.  Cardiovascular: Normal rate and regular rhythm.  No murmur heard. Pulmonary/Chest: Effort normal and breath sounds normal. No respiratory distress. She has no wheezes.  Neurological: She is alert and oriented to person, place, and time.  Skin: Skin is warm and dry.  Psychiatric: She has a normal mood and affect. Her behavior is normal.  Vitals reviewed.  Reviewed previous blood  work done at [facility].     Assessment & Plan:  RTC in 4-6 months.

## 2018-03-12 NOTE — Assessment & Plan Note (Signed)
This is chronic, undifferentiated and followed by oncology.  Reviewed last Oncology note.  Watching and waiting is the plan for now.

## 2018-03-12 NOTE — Assessment & Plan Note (Addendum)
Seen on last CBC, will monitor.  She is also following with oncology.

## 2018-03-12 NOTE — Assessment & Plan Note (Signed)
She continues to have issues with mobility related to this. She uses a cane.  With tramadol and occasional ibuprofen, she is able to care for 3 grand children  Plan Continue current therapy.

## 2018-03-12 NOTE — Assessment & Plan Note (Signed)
Likely related to lasix.  She is on a chronic low dose supplementation and has had good KCl levels recently.  Continue to monitor with yearly BMET.

## 2018-03-12 NOTE — Assessment & Plan Note (Signed)
She is due for a colonoscopy, or at least evaluation for one.  She had a colonoscopy in 2004 with an adenoma.  Referral to GI for possible study.  FOBT in 2018 was negative.  We discussed the Shingrix at length today.  She will consider.

## 2018-03-12 NOTE — Assessment & Plan Note (Signed)
Her HR is well controlled at 88 today.  She has no complaints of palpitations.    Plan Continue metoprolol Continue eliquis

## 2018-03-17 ENCOUNTER — Encounter: Payer: Self-pay | Admitting: Internal Medicine

## 2018-04-02 ENCOUNTER — Telehealth: Payer: Self-pay | Admitting: Internal Medicine

## 2018-04-02 DIAGNOSIS — J4540 Moderate persistent asthma, uncomplicated: Secondary | ICD-10-CM

## 2018-04-02 DIAGNOSIS — M1711 Unilateral primary osteoarthritis, right knee: Secondary | ICD-10-CM

## 2018-04-02 NOTE — Telephone Encounter (Signed)
Rec'd call from the patient.  The was unable to obtain her Shower back in 2018 and would like to try again and she has a different insurance now.  Please advise if another DME order can be placed.

## 2018-04-03 ENCOUNTER — Encounter: Payer: Self-pay | Admitting: Gastroenterology

## 2018-04-03 NOTE — Telephone Encounter (Signed)
Yes.  I it should be a DME order for a Civil engineer, contracting.

## 2018-04-03 NOTE — Telephone Encounter (Signed)
Shower chair

## 2018-04-04 NOTE — Telephone Encounter (Signed)
Ordered.  Do you need that piece of paper that prints?  I did this from my office, so not sure where it would have printed.   Thanks.  Let me know how I can help.

## 2018-04-06 ENCOUNTER — Other Ambulatory Visit: Payer: Self-pay | Admitting: Internal Medicine

## 2018-04-08 NOTE — Telephone Encounter (Signed)
Will go ahead and fax the DME  Order you placed to [facility] and notify the patient.  I do not need that paper that printed.   Thanks for your help!

## 2018-04-28 ENCOUNTER — Other Ambulatory Visit: Payer: Self-pay | Admitting: *Deleted

## 2018-04-28 MED ORDER — POTASSIUM CHLORIDE CRYS ER 20 MEQ PO TBCR: 10.0000 meq | EXTENDED_RELEASE_TABLET | Freq: Every day | ORAL | 1 refills | Status: DC

## 2018-05-05 ENCOUNTER — Other Ambulatory Visit: Payer: Self-pay | Admitting: Internal Medicine

## 2018-05-07 ENCOUNTER — Telehealth: Payer: Self-pay | Admitting: *Deleted

## 2018-05-07 NOTE — Telephone Encounter (Signed)
I have called pt several  times and LMTRC--   Pt is on Eliquisa nd needs an OV with APP before her colon- we need to cancel PV and make APP OV   Left message with son Gerald Stabs as well

## 2018-05-07 NOTE — Telephone Encounter (Signed)
Called pt- no answer- LMTRC again   Erika [name]

## 2018-05-08 NOTE — Telephone Encounter (Signed)
Called pt and son, no answer, left message for son to call us back.

## 2018-05-08 NOTE — Telephone Encounter (Signed)
Called pt- No answer, left message

## 2018-05-09 NOTE — Telephone Encounter (Signed)
Called pt., no answer, left message for pt to return my call.

## 2018-05-12 NOTE — Telephone Encounter (Signed)
Spoke with patient. She states she is taking Eliquis. Explained to her that we need to make OV instead of pv. Patient wants to come in [place]. Appointment made with Nevin Bloodgood for wed. 05/14/18 at 1130 am. Pt aware to bring list of medications and insurance card.

## 2018-05-13 ENCOUNTER — Ambulatory Visit (INDEPENDENT_AMBULATORY_CARE_PROVIDER_SITE_OTHER): Payer: PPO | Admitting: *Deleted

## 2018-05-13 DIAGNOSIS — Z23 Encounter for immunization: Secondary | ICD-10-CM

## 2018-05-14 ENCOUNTER — Encounter: Payer: Self-pay | Admitting: Gastroenterology

## 2018-05-14 ENCOUNTER — Ambulatory Visit (INDEPENDENT_AMBULATORY_CARE_PROVIDER_SITE_OTHER): Payer: PPO | Admitting: Nurse Practitioner

## 2018-05-14 ENCOUNTER — Encounter: Payer: Self-pay | Admitting: Nurse Practitioner

## 2018-05-14 ENCOUNTER — Telehealth: Payer: Self-pay

## 2018-05-14 VITALS — BP 130/70 | HR 91 | Ht 66.0 in | Wt 287.0 lb

## 2018-05-14 DIAGNOSIS — Z1211 Encounter for screening for malignant neoplasm of colon: Secondary | ICD-10-CM

## 2018-05-14 DIAGNOSIS — Z7901 Long term (current) use of anticoagulants: Secondary | ICD-10-CM | POA: Diagnosis not present

## 2018-05-14 NOTE — Telephone Encounter (Signed)
[facility] [address] [place], [place]  [address] Phone:  [phone]   Fax:  [phone]  05/14/2018   RE:      Erika [name] DOB:   1940/09/08 MRN:   [id number]   Dear Dr. Daryll Drown,    We have scheduled the above patient for an endoscopic procedure. Our records show that she is on anticoagulation therapy.   Please advise as to whether the patient may come off her therapy of Eliquis two days prior to the colonoscopy procedure, which is scheduled for 05/28/18.  Please fax back/ or route to [place], [place] at [phone].   Sincerely,    Thurmon Fair, RMA

## 2018-05-14 NOTE — Progress Notes (Signed)
Reviewed. I agree with documentation including the assessment and plan.  Shanna Un L. Makeila Yamaguchi, MD, MPH 

## 2018-05-14 NOTE — Progress Notes (Signed)
Chief Complaint: Colon cancer screening  Referring Provider:  Dr. Irene Limbo       ASSESSMENT AND PLAN;   73.  77 year old female for colon cancer screening.  No alarm features.  She has mild chronic stable anemia.   -Patient is already scheduled for the colonoscopy with Dr. Tarri Glenn later this month. The risks and benefits of colonoscopy with possible polypectomy were discussed and the patient agrees to proceed.   2. Afib, on Eliquis.  -Hold Eliquis for 2 days before procedure - will instruct when and how to resume after procedure. Patient understands that there is a low but real risk of cardiovascular event such as heart attack, stroke, or embolism /  thrombosis while off blood thinner. The patient consents to proceed. Will communicate by phone or EMR with patient's prescribing provider to confirm that holding Eliquis is reasonable in this case.   3.  morbid obesity (BMI 46)  4. Recently diagnosed myeloproliferative disorder.   HPI:    Patient is a 77 year old female with AFIB on Eliquis, morbid obesity, asthma and myeloproliferative disorder. She is referred for colon cancer screening.  She is actually already on the schedule to have this done with Dr. Tarri Glenn later this month.  Patient is here for a quick preprocedure check because she is on apixaban.  Patient does not know why she takes apixaban, it appears this was started back in August for atrial fibrillation.  Patient has marked leukocytosis. She is being followed by Dr. Irene Limbo (Heme/Onc) for recently diagnosed unclassifiable myeloproliferative neoplasm or MPN/MDS with a chek 2 mutation.   Currently not undergoing treatment but on a watch and wait plan.  It has mild chronic anemia.  Hemoglobin stable around 11.4.   Patient has no GI complaints.  Specifically no abdominal pain, bowel changes or blood in stool.  He has no general medical complaints such as chest pains or shortness of breath.  She has asthma with frequent wheezing   Past  Medical History:  Diagnosis Date  . Anemia    with menses  . Asthma   . Back pain    status post surgery 2002  . Breast cyst    Excesion with FNA, begnin in 2004.   . Degenerative joint disease of spine    Imaging 2005,  Degenerative hypertrophic facet arthritis changes L4-5 and L5-S1..   . History of shingles    Recurrent with post herpetic neuralgia.   [name] Hypertension   . Lymphadenopathy    Of the mediastinum, Right side CXR 2008, not read on 2010 cxr.   . Menopause   . Obesity    BMI 54  . Psychosis ([place])    Secondary to prednisone.  . Shingles   . Stasis dermatitis    W/ LE edema, prviously on lasix now on mazxide.   . SVT (supraventricular tachycardia) [facility]) June 2009   one run while hospitalized  . Tuberculosis    active TB treated in 2002, hx of paraspinal lumbar TB,      Past Surgical History:  Procedure Laterality Date  . BREAST CYST ASPIRATION Left   . Breast cyst biopsy    . CHOLECYSTECTOMY  02/02/2012   Procedure: LAPAROSCOPIC CHOLECYSTECTOMY;  Surgeon: Zenovia Jarred, MD;  Location: [facility] OR;  Service: General;  Laterality: N/A;  . Hemilaminectomy of L4, L5 and S1 decompression of tumor in epidural space  2000   Family History  Problem Relation Age of Onset  . Stroke Mother  at young age  . Stroke Father        in 3's  . Heart attack Father   . Stroke Brother   . Stroke Brother   . Breast cancer Other    Social History   Tobacco Use  . Smoking status: Never Smoker  . Smokeless tobacco: Never Used  Substance Use Topics  . Alcohol use: No    Alcohol/week: 0.0 standard drinks  . Drug use: No   Current Outpatient Medications  Medication Sig Dispense Refill  . albuterol (PROAIR HFA) 108 (90 Base) MCG/ACT inhaler inhale 1 to 2 puffs every 6 hours if needed 8.5 g 6  . amLODipine (NORVASC) 10 MG tablet TAKE 1 TABLET BY MOUTH EVERY DAY 90 tablet 3  . apixaban (ELIQUIS) 5 MG TABS tablet Take 1 tablet (5 mg total) by mouth 2 (two) times daily. 60  tablet 11  . aspirin EC 81 MG tablet Take 81 mg by mouth daily.    . fexofenadine (ALLEGRA ALLERGY) 180 MG tablet Take 1 tablet (180 mg total) by mouth daily. 30 tablet 2  . fluticasone (FLONASE) 50 MCG/ACT nasal spray SHAKE LIQUID AND USE 1 SPRAY IN EACH NOSTRIL DAILY 16 g 6  . Fluticasone-Salmeterol (ADVAIR DISKUS) 500-50 MCG/DOSE AEPB INHALE 1 PUFF BY MOUTH TWICE A DAY 180 each 3  . furosemide (LASIX) 20 MG tablet Take 1 tablet (20 mg total) by mouth daily as needed for edema. 30 tablet 6  . lisinopril (PRINIVIL,ZESTRIL) 5 MG tablet take 1 tablet by mouth once daily 30 tablet 6  . metoprolol tartrate (LOPRESSOR) 25 MG tablet TAKE 1/2 TABLET(12.5 MG) BY MOUTH TWICE DAILY 60 tablet 6  . omeprazole (PRILOSEC) 20 MG capsule TAKE 1 CAPSULE BY MOUTH EVERY DAY 90 capsule 0  . potassium chloride SA (K-DUR,KLOR-CON) 20 MEQ tablet Take 0.5 tablets (10 mEq total) by mouth daily. 90 tablet 1  . pravastatin (PRAVACHOL) 20 MG tablet Take 1 tablet (20 mg total) by mouth daily. 90 tablet 3  . sodium chloride (OCEAN) 0.65 % SOLN nasal spray Place 1 spray into both nostrils as needed for congestion. 88 mL 0  . traMADol (ULTRAM) 50 MG tablet Take 1 tablet (50 mg total) by mouth every 12 (twelve) hours as needed. 50 tablet 2   No current facility-administered medications for this visit.    Allergies  Allergen Reactions  . Prednisone Other (See Comments)    REACTION: Psychosis, talking out of head, insomnia     Review of Systems: All systems reviewed and negative except where noted in HPI.   Creatinine clearance cannot be calculated (Patient's most recent lab result is older than the maximum 21 days allowed.)   Physical Exam:    Wt Readings from Last 3 Encounters:  05/14/18 287 lb (130.2 kg)  03/12/18 285 lb 11.2 oz (129.6 kg)  03/06/18 276 lb 14.4 oz (125.6 kg)    BP 130/70   Pulse 91   Ht 5\' 6"  (1.676 m)   Wt 287 lb (130.2 kg)   BMI 46.32 kg/m  Constitutional:  Pleasant morbidly obese  female in no acute distress. Psychiatric: Normal mood and affect. Behavior is normal. EENT: Pupils normal.  Conjunctivae are normal. No scleral icterus. Neck supple.  Cardiovascular: Normal rate, regular rhythm. No edema Pulmonary/chest: Effort normal and breath sounds normal. Occasional wheeze. Abdominal: Soft, nondistended, nontender. Bowel sounds active throughout.  Just right of the umbilicus is a superficial area of firmness about the size of a plum .  Neurological: Alert and oriented to person place and time. Skin: Skin is warm and dry. No rashes noted.  Tye Savoy, NP  05/14/2018, 11:52 AM  Cc: [name], MD

## 2018-05-14 NOTE — Patient Instructions (Signed)
If you are age 75 or older, your body mass index should be between 23-30. Your Body mass index is 46.32 kg/m. If this is out of the aforementioned range listed, please consider follow up with your Primary Care Provider.  If you are age 55 or younger, your body mass index should be between 19-25. Your Body mass index is 46.32 kg/m. If this is out of the aformentioned range listed, please consider follow up with your Primary Care Provider.   You have been scheduled for a colonoscopy. Please follow written instructions given to you at your visit today.  Please pick up your prep supplies at the pharmacy within the next 1-3 days. If you use inhalers (even only as needed), please bring them with you on the day of your procedure. Your physician has requested that you go to www.startemmi.com and enter the access code given to you at your visit today. This web site gives a general overview about your procedure. However, you should still follow specific instructions given to you by our office regarding your preparation for the procedure.  You will be contacted by our office prior to your procedure for directions on holding your Eliquis.  If you do not hear from our office 1 week prior to your scheduled procedure, please call [phone] to discuss.   Thank you for choosing me and [facility].   Tye Savoy, NP

## 2018-05-15 NOTE — Telephone Encounter (Signed)
Hello - -   Thank you for the communication.    Ms. Scinto is on Eliquis for Afib.  Her peri-operative thrombo-embolic risk is < 1 % given the results of the Aristotle trial.  She can stop the Eliquis 48 hours prior to procedure and restart 48 hours after the procedure.  I called and discussed this with her, and she expressed understanding.  All questions answered.   Thank you  Gilles Chiquito, MD

## 2018-05-22 NOTE — Telephone Encounter (Signed)
I have left two messages on patients voicemail requesting that she return my call regarding hold her Eliquis.  Patients procedure is on 05/28/18

## 2018-05-23 NOTE — Telephone Encounter (Signed)
Left a message for patient to return my call. 

## 2018-05-26 NOTE — Telephone Encounter (Signed)
Left message this morning on patients voicemail to contact the office regarding holding her Eliquis as she would need to start holding today in order to have her procedure on Wednesday.  Also left a voicemail for her son last week to have patient contact our office.

## 2018-05-28 ENCOUNTER — Encounter: Payer: PPO | Admitting: Gastroenterology

## 2018-07-01 ENCOUNTER — Other Ambulatory Visit: Payer: Self-pay | Admitting: Internal Medicine

## 2018-07-01 DIAGNOSIS — K219 Gastro-esophageal reflux disease without esophagitis: Secondary | ICD-10-CM

## 2018-07-04 ENCOUNTER — Telehealth: Payer: Self-pay

## 2018-07-04 ENCOUNTER — Inpatient Hospital Stay: Payer: PPO | Attending: Hematology | Admitting: Hematology

## 2018-07-04 ENCOUNTER — Inpatient Hospital Stay: Payer: PPO

## 2018-07-04 VITALS — BP 145/84 | HR 96 | Temp 98.4°F | Resp 20 | Ht 66.0 in | Wt 292.9 lb

## 2018-07-04 DIAGNOSIS — D471 Chronic myeloproliferative disease: Secondary | ICD-10-CM

## 2018-07-04 DIAGNOSIS — D72821 Monocytosis (symptomatic): Secondary | ICD-10-CM

## 2018-07-04 DIAGNOSIS — D649 Anemia, unspecified: Secondary | ICD-10-CM

## 2018-07-04 DIAGNOSIS — I10 Essential (primary) hypertension: Secondary | ICD-10-CM | POA: Diagnosis not present

## 2018-07-04 DIAGNOSIS — Z79899 Other long term (current) drug therapy: Secondary | ICD-10-CM | POA: Diagnosis not present

## 2018-07-04 DIAGNOSIS — D72828 Other elevated white blood cell count: Secondary | ICD-10-CM

## 2018-07-04 DIAGNOSIS — Z7982 Long term (current) use of aspirin: Secondary | ICD-10-CM

## 2018-07-04 DIAGNOSIS — Z7901 Long term (current) use of anticoagulants: Secondary | ICD-10-CM | POA: Diagnosis not present

## 2018-07-04 LAB — CBC WITH DIFFERENTIAL/PLATELET
Abs Immature Granulocytes: 3.64 10*3/uL — ABNORMAL HIGH (ref 0.00–0.07)
BASOS PCT: 0 %
Basophils Absolute: 0.2 10*3/uL — ABNORMAL HIGH (ref 0.0–0.1)
Eosinophils Absolute: 0.8 10*3/uL — ABNORMAL HIGH (ref 0.0–0.5)
Eosinophils Relative: 2 %
HCT: 33.2 % — ABNORMAL LOW (ref 36.0–46.0)
Hemoglobin: 10.3 g/dL — ABNORMAL LOW (ref 12.0–15.0)
IMMATURE GRANULOCYTES: 8 %
Lymphocytes Relative: 5 %
Lymphs Abs: 2.6 10*3/uL (ref 0.7–4.0)
MCH: 30.1 pg (ref 26.0–34.0)
MCHC: 31 g/dL (ref 30.0–36.0)
MCV: 97.1 fL (ref 80.0–100.0)
MONOS PCT: 4 %
Monocytes Absolute: 1.8 10*3/uL — ABNORMAL HIGH (ref 0.1–1.0)
Neutro Abs: 38.9 10*3/uL — ABNORMAL HIGH (ref 1.7–7.7)
Neutrophils Relative %: 81 %
Platelets: 160 10*3/uL (ref 150–400)
RBC: 3.42 MIL/uL — AB (ref 3.87–5.11)
RDW: 15.9 % — ABNORMAL HIGH (ref 11.5–15.5)
WBC: 48 10*3/uL — AB (ref 4.0–10.5)
nRBC: 0 % (ref 0.0–0.2)

## 2018-07-04 LAB — CMP (CANCER CENTER ONLY)
ALBUMIN: 3.2 g/dL — AB (ref 3.5–5.0)
ALK PHOS: 228 U/L — AB (ref 38–126)
ALT: 6 U/L (ref 0–44)
AST: 12 U/L — AB (ref 15–41)
Anion gap: 8 (ref 5–15)
BILIRUBIN TOTAL: 0.3 mg/dL (ref 0.3–1.2)
BUN: 13 mg/dL (ref 8–23)
CALCIUM: 8.8 mg/dL — AB (ref 8.9–10.3)
CO2: 28 mmol/L (ref 22–32)
Chloride: 106 mmol/L (ref 98–111)
Creatinine: 1.14 mg/dL — ABNORMAL HIGH (ref 0.44–1.00)
GFR, EST NON AFRICAN AMERICAN: 47 mL/min — AB (ref >60)
GFR, Est AFR Am: 54 mL/min — ABNORMAL LOW (ref >60)
GLUCOSE: 78 mg/dL (ref 70–99)
Potassium: 3.8 mmol/L (ref 3.5–5.1)
Sodium: 142 mmol/L (ref 135–145)
TOTAL PROTEIN: 7.8 g/dL (ref 6.5–8.1)

## 2018-07-04 LAB — LACTATE DEHYDROGENASE: LDH: 232 U/L — ABNORMAL HIGH (ref 98–192)

## 2018-07-04 NOTE — Telephone Encounter (Signed)
Printed avs and calender of upcoming appointment. Per 11/29 los. Patient request r/v on 3/3

## 2018-07-10 NOTE — Progress Notes (Signed)
HEMATOLOGY/ONCOLOGY CLINIC NOTE  Date of Service: .07/04/2018  Inpatient Attending: .Brunetta Genera, MD  CC:  F/u MPN NOS chek2 mutation +ve  HPI  Patient is a very pleasant 77 year old female with a history of morbid obesity, anemia, asthma, hypertension, lumbar spinal TB in 2002 who presented with acute onset of lower back pain on Monday. She reports that she drank some pepsi and tea on that day and her back pain began shortly after. No initial injury to precipitate her back pain. It gradually worsened until she was seen in the ED on 05/30/17. Labs during admission revealed WBC count of 55.2k with predominantly neutrophilia of 48.8k and some monocytosis at 2.2k.   She had minimal anemia at 11.5 with an MCV of 92 and a normal platelet count of 163k.  At that time she had CT A/P while in the ED which was normal and without splenomegaly. Additionally, she also had MRI of the L-spine which only showed degenerative changes. She was subsequently admitted for further workup and pain control and to rule out a source of infection.   Of note, the patient has a remote prior h/o epidural mass in 2002 (hemilaminectomy and decompression of tumor in epidural space 2002) which was thought to be secondary to lumbar epidural tuberculosis. She was placed on a course of antibiotics for several months following her diagnosis.   Following her admission, we were consulted for further workup. No recent new medications. She reports that her inhaler does contain steroids, but otherwise she has not been on a course of steroids recently. She is UTD with her regular cancer screenings. She does not feel any different now than she did 30moto a year ago.  No focal symptoms suggestive of infection other than the back pain at this time.  On ROS, she denies fever, chills, night sweats, unexpected weight loss, abdominal pain, sore throat, joint swelling, or any other associated symptoms. Her back pain is much improved  with muscle relaxants. She does note some right great toe pain, but otherwise she has no other acute symptoms.   INTERVAL HISTORY:   Erika HENKENis her for follow up of persistent leucocytosis/neutrophilia.consistent with chronic MPN NOS with Chek2 mutation.  She notes no acute new concerns. Labs reviewed and noted to be stable. On review of systems, pt reports recent bacterial infection, stable energy levels, and denies skin rashes, abdominal pains, back pains, headaches, leg swelling, and any other symptoms.    MEDICAL HISTORY:  Past Medical History:  Diagnosis Date  . Anemia    with menses  . Asthma   . Back pain    status post surgery 2002  . Breast cyst    Excesion with FNA, begnin in 2004.   . Degenerative joint disease of spine    Imaging 2005,  Degenerative hypertrophic facet arthritis changes L4-5 and L5-S1..   . History of shingles    Recurrent with post herpetic neuralgia.   .Marland KitchenHypertension   . Lymphadenopathy    Of the mediastinum, Right side CXR 2008, not read on 2010 cxr.   . Menopause   . Obesity    BMI 54  . [place]    Secondary to prednisone.  . Shingles   . Stasis dermatitis    W/ LE edema, prviously on lasix now on mazxide.   . SVT (supraventricular tachycardia) ([facility] June 2009   one run while hospitalized  . Tuberculosis    active TB treated in 2002, hx of paraspinal  lumbar TB,     SURGICAL HISTORY: Past Surgical History:  Procedure Laterality Date  . BREAST CYST ASPIRATION Left   . Breast cyst biopsy    . CHOLECYSTECTOMY  02/02/2012   Procedure: LAPAROSCOPIC CHOLECYSTECTOMY;  Surgeon: Zenovia Jarred, MD;  Location: [place];  Service: General;  Laterality: N/A;  . Hemilaminectomy of L4, L5 and S1 decompression of tumor in epidural space  2000    SOCIAL HISTORY: Social History   Socioeconomic History  . Marital status: Widowed    Spouse name: Not on file  . Number of children: Not on file  . Years of education: Not on file  .  Highest education level: Not on file  Occupational History  . 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 Occupation: volunteers at [employer]  . Financial resource strain: Not on file  . Food insecurity:    Worry: Not on file    Inability: Not on file  . Transportation needs:    Medical: Not on file    Non-medical: Not on file  Tobacco Use  . Smoking status: Never Smoker  . Smokeless tobacco: Never Used  Substance and Sexual Activity  . Alcohol use: No    Alcohol/week: 0.0 standard drinks  . Drug use: No  . Sexual activity: Never    Birth control/protection: Post-menopausal  Lifestyle  . Physical activity:    Days per week: Not on file    Minutes per session: Not on file  . Stress: Not on file  Relationships  . Social connections:    Talks on phone: Not on file    Gets together: Not on file    Attends religious service: Not on file    Active member of club or organization: Not on file    Attends meetings of clubs or organizations: Not on file    Relationship status: Not on file  . Intimate partner violence:    Fear of current or ex partner: Not on file    Emotionally abused: Not on file    Physically abused: Not on file    Forced sexual activity: Not on file  Other Topics Concern  . Not on file  Social History Narrative   No regular exercise, retired, single, volunteers at a local school.     FAMILY HISTORY: Family History  Problem Relation Age of Onset  . Stroke Mother        at young age  . Stroke Father        in 71's  . Heart attack Father   . Stroke Brother   . Stroke Brother   . Breast cancer Other     ALLERGIES:  is allergic to prednisone.  MEDICATIONS:  . Current Outpatient Medications:  .  albuterol (PROAIR HFA) 108 (90 Base) MCG/ACT inhaler, inhale 1 to 2 puffs every 6 hours if needed, Disp: 8.5 g, Rfl: 6 .  amLODipine (NORVASC) 10 MG tablet, TAKE 1 TABLET BY MOUTH EVERY DAY, Disp: 90 tablet, Rfl: 3 .  apixaban  (ELIQUIS) 5 MG TABS tablet, Take 1 tablet (5 mg total) by mouth 2 (two) times daily., Disp: 60 tablet, Rfl: 11 .  aspirin EC 81 MG tablet, Take 81 mg by mouth daily., Disp: , Rfl:  .  fexofenadine (ALLEGRA ALLERGY) 180 MG tablet, Take 1 tablet (180 mg total) by mouth daily., Disp: 30 tablet, Rfl: 2 .  fluticasone (FLONASE) 50 MCG/ACT nasal spray, SHAKE LIQUID AND USE 1 SPRAY IN [facility]  NOSTRIL DAILY, Disp: 16 g, Rfl: 6 .  Fluticasone-Salmeterol (ADVAIR DISKUS) 500-50 MCG/DOSE AEPB, INHALE 1 PUFF BY MOUTH TWICE A DAY, Disp: 180 each, Rfl: 3 .  furosemide (LASIX) 20 MG tablet, Take 1 tablet (20 mg total) by mouth daily as needed for edema., Disp: 30 tablet, Rfl: 6 .  lisinopril (PRINIVIL,ZESTRIL) 5 MG tablet, take 1 tablet by mouth once daily, Disp: 30 tablet, Rfl: 6 .  metoprolol tartrate (LOPRESSOR) 25 MG tablet, TAKE 1/2 TABLET(12.5 MG) BY MOUTH TWICE DAILY, Disp: 60 tablet, Rfl: 6 .  omeprazole (PRILOSEC) 20 MG capsule, TAKE 1 CAPSULE BY MOUTH EVERY DAY, Disp: 90 capsule, Rfl: 0 .  potassium chloride SA (K-DUR,KLOR-CON) 20 MEQ tablet, Take 0.5 tablets (10 mEq total) by mouth daily., Disp: 90 tablet, Rfl: 1 .  pravastatin (PRAVACHOL) 20 MG tablet, Take 1 tablet (20 mg total) by mouth daily., Disp: 90 tablet, Rfl: 3 .  sodium chloride (OCEAN) 0.65 % SOLN nasal spray, Place 1 spray into both nostrils as needed for congestion., Disp: 88 mL, Rfl: 0 .  traMADol (ULTRAM) 50 MG tablet, Take 1 tablet (50 mg total) by mouth every 12 (twelve) hours as needed., Disp: 50 tablet, Rfl: 2   REVIEW OF SYSTEMS:   .10 Point review of Systems was done is negative except as noted above.  PHYSICAL EXAMINATION:  Vitals:   07/04/18 1007  BP: (!) 145/84  Pulse: 96  Resp: 20  Temp: 98.4 F (36.9 C)  TempSrc: Oral  SpO2: 99%  Weight: 292 lb 14.4 oz (132.9 kg)  Height: _0  (1.676 m)  . GENERAL:alert, in no acute distress and comfortable SKIN: no acute rashes, no significant lesions EYES: conjunctiva are pink  and non-injected, sclera anicteric OROPHARYNX: MMM, no exudates, no oropharyngeal erythema or ulceration NECK: supple, no JVD LYMPH:  no palpable lymphadenopathy in the cervical, axillary or inguinal regions LUNGS: clear to auscultation b/l with normal respiratory effort HEART: regular rate & rhythm ABDOMEN:  normoactive bowel sounds , non tender, not distended. Extremity: no pedal edema PSYCH: alert & oriented x 3 with fluent speech NEURO: no focal motor/sensory deficits   LABORATORY DATA:  I have reviewed the data as listed   .       CBC Latest Ref Rng & Units 07/04/2018 03/06/2018 12/04/2017  WBC 4.0 - 10.5 K/uL 48.0(H) 48.2(H) 34.3(H)  Hemoglobin 12.0 - 15.0 g/dL 10.3(L) 11.4(L) 11.3(L)  Hematocrit 36.0 - 46.0 % 33.2(L) 35.8 36.4  Platelets 150 - 400 K/uL 160 166 145  ANC 40.2k . CBC    Component Value Date/Time   WBC 48.0 (H) 07/04/2018 0916   RBC 3.42 (L) 07/04/2018 0916   HGB 10.3 (L) 07/04/2018 0916   HGB 11.4 (L) 03/06/2018 0910   HGB 11.3 (L) 07/24/2017 1402   HCT 33.2 (L) 07/04/2018 0916   HCT 36.0 07/24/2017 1402   PLT 160 07/04/2018 0916   PLT 166 03/06/2018 0910   PLT 172 07/24/2017 1402   MCV 97.1 07/04/2018 0916   MCV 93.3 07/24/2017 1402   MCH 30.1 07/04/2018 0916   MCHC 31.0 07/04/2018 0916   RDW 15.9 (H) 07/04/2018 0916   RDW 15.6 (H) 07/24/2017 1402   LYMPHSABS 2.6 07/04/2018 0916   LYMPHSABS 2.9 07/24/2017 1402   MONOABS 1.8 (H) 07/04/2018 0916   MONOABS 1.8 (H) 07/24/2017 1402   EOSABS 0.8 (H) 07/04/2018 0916   EOSABS 1.1 (H) 07/24/2017 1402   BASOSABS 0.2 (H) 07/04/2018 0916   BASOSABS 0.2 (H) 07/24/2017 1402   .  CMP Latest Ref Rng & Units 07/04/2018 03/06/2018 12/04/2017  Glucose 70 - 99 mg/dL 78 82 87  BUN 8 - 23 mg/dL _0 Creatinine 0.44 - 1.00 mg/dL 1.14(H) 1.04(H) 1.09  Sodium 135 - 145 mmol/L 142 141 139  Potassium 3.5 - 5.1 mmol/L 3.8 4.1 4.2  Chloride 98 - 111 mmol/L 106 103 103  CO2 22 - 32 mmol/L _1 Calcium 8.9 -  10.3 mg/dL 8.8(L) 9.6 9.7  Total Protein 6.5 - 8.1 g/dL 7.8 8.4(H) 8.7(H)  Total Bilirubin 0.3 - 1.2 mg/dL 0.3 0.3 0.4  Alkaline Phos 38 - 126 U/L 228(H) 221(H) 186(H)  AST 15 - 41 U/L 12(L) 13(L) 12  ALT 0 - 44 U/L _2 RADIOGRAPHIC STUDIES: I have personally reviewed the radiological images as listed and agreed with the findings in the report. No results found.  ASSESSMENT & PLAN:   ALYSIANA ETHRIDGE is a delightful, well-appearing 77 y.o. woman with   #1 Leukocytosis?predominantly Neutrophilia with some monocytosis CMML vs MPN NOS with chek 2 mutation Patient initially presented with a WBC count of 55.2k with 48.8k neutrophils.  WBC counts relatively stable today.  Peripheral blood smear personally previously reviewed-shows significant neutrophilia with left shift.  Increased bands with a few myelocytes and metamyelocytes.  Pelgeroid neutrophils.  No significant increased blasts.  Monocytosis present.  CT abdomen done for workup of her back pain did not show any acute intra-abdominal or pelvic findings. He does not have any overt splenomegaly on her CT abdomen -which is somewhat argues against a chronic myeloproliferative disorder though does not rule this out.  MRI of the lumbar spine shows old surgical and degenerative changes but no evidence of tumor or infectious collections.  Leucocytosis - improved at 34.3k today. No fevers/chills or other constitutional symptoms  Foundation One heme with Chek 2 mutation. No other findings noted.  CT chest on 05/31/2017: IMPRESSION: 1. Mild ground-glass opacities suggest mild pulmonary edema. Small bilateral pleural effusions and basilar atelectasis. No pulmonary infiltrate. Mild subcarinal mediastinal adenopathy is likely reactive. Cardiomegaly.  JAK2 and BCR-ABL mutations neg. LDH levels were within normal limits and did not suggest a high-grade lymphoma.  06/24/17 BM bx- this showed hypercellular bone marrow with granulocytic  proliferation. Cytogenetic analysis was normal.   #2 mild normocytic anemia. HGb at 11.4 today   PLAN:  -Ms Prewitt currently meets the criteria of an unclassifiable myeloproliferative neoplasm or MPN/MDS with a chek 2 mutation.  -Discussed pt labwork today, 07/04/18; blood counts and chemistries are stable -Discussed with pt that if her WBC counts increase significantly, or she develops constitutional symptoms, or liver or spleen sizes increase, we will recommend Hydroxyurea. -Recommend pt stay up to date with her age appropriate cancer screenings with her PCP. -We offered to refer the pt to receive a second opinion if she would like this; she notes that she would like to wait on this for now.  -Pt has had a bacterial infection in the last two weeks which has symptomatically resolved after receiving a course of antibiotics, explaining interval increase in [place] -No indication for Hydroxyurea at this time as pt is quite clinically stable -Will continue regular clinical and lab observation   RTC with dr Irene Limbo in 3 months with labs   The total time spent in the appt was 20 minutes and more than 50% was on counseling and direct patient cares.    Sullivan Lone MD [place] AAHIVMS [facility] Hematology/Oncology  Physician [facility]  (Office):       [phone] (Work cell):  [phone] (Fax):           [phone]

## 2018-07-16 ENCOUNTER — Other Ambulatory Visit: Payer: Self-pay | Admitting: Internal Medicine

## 2018-08-09 ENCOUNTER — Other Ambulatory Visit: Payer: Self-pay | Admitting: Internal Medicine

## 2018-08-27 ENCOUNTER — Encounter: Payer: Self-pay | Admitting: *Deleted

## 2018-09-05 ENCOUNTER — Telehealth: Payer: Self-pay | Admitting: Internal Medicine

## 2018-09-05 NOTE — Telephone Encounter (Signed)
Thank you Erika [name].  Can you page me with information if she calls back?  I will not be at a computer this afternoon unless needed.  Thanks

## 2018-09-05 NOTE — Telephone Encounter (Signed)
Called pt again - no answer; left message to call the office if needed.

## 2018-09-05 NOTE — Telephone Encounter (Signed)
Called pt - no answer; left message I was calling to how she was doing.

## 2018-09-05 NOTE — Telephone Encounter (Signed)
Pls call patient back; [phone]

## 2018-09-05 NOTE — Telephone Encounter (Signed)
Pt has a real bad cold, wondering if the physician can give her something for it, pt contact [phone]

## 2018-09-05 NOTE — Telephone Encounter (Signed)
Called pt - no answer; left message again.

## 2018-09-09 NOTE — Telephone Encounter (Signed)
Called pt once again - no answer; left message to call back if needed.

## 2018-09-17 ENCOUNTER — Encounter: Payer: PPO | Admitting: Internal Medicine

## 2018-09-22 ENCOUNTER — Other Ambulatory Visit: Payer: Self-pay | Admitting: Internal Medicine

## 2018-09-29 ENCOUNTER — Other Ambulatory Visit: Payer: Self-pay | Admitting: Internal Medicine

## 2018-09-29 DIAGNOSIS — K219 Gastro-esophageal reflux disease without esophagitis: Secondary | ICD-10-CM

## 2018-10-03 NOTE — Progress Notes (Signed)
HEMATOLOGY/ONCOLOGY CLINIC NOTE  Date of Service: 10/07/18    Inpatient Attending: .Brunetta Genera, MD  CC:  F/u MPN NOS chek2 mutation +ve  HPI  Patient is a very pleasant 78 year old female with a history of morbid obesity, anemia, asthma, hypertension, lumbar spinal TB in 2002 who presented with acute onset of lower back pain on Monday. She reports that she drank some pepsi and tea on that day and her back pain began shortly after. No initial injury to precipitate her back pain. It gradually worsened until she was seen in the ED on 05/30/17. Labs during admission revealed WBC count of 55.2k with predominantly neutrophilia of 48.8k and some monocytosis at 2.2k.   She had minimal anemia at 11.5 with an MCV of 92 and a normal platelet count of 163k.  At that time she had CT A/P while in the ED which was normal and without splenomegaly. Additionally, she also had MRI of the L-spine which only showed degenerative changes. She was subsequently admitted for further workup and pain control and to rule out a source of infection.   Of note, the patient has a remote prior h/o epidural mass in 2002 (hemilaminectomy and decompression of tumor in epidural space 2002) which was thought to be secondary to lumbar epidural tuberculosis. She was placed on a course of antibiotics for several months following her diagnosis.   Following her admission, we were consulted for further workup. No recent new medications. She reports that her inhaler does contain steroids, but otherwise she has not been on a course of steroids recently. She is UTD with her regular cancer screenings. She does not feel any different now than she did 36moto a year ago.  No focal symptoms suggestive of infection other than the back pain at this time.  On ROS, she denies fever, chills, night sweats, unexpected weight loss, abdominal pain, sore throat, joint swelling, or any other associated symptoms. Her back pain is much improved  with muscle relaxants. She does note some right great toe pain, but otherwise she has no other acute symptoms.   INTERVAL HISTORY:   MANTIA RAHALis here for follow up of persistent leucocytosis/neutrophilia.consistent with chronic MPN NOS with Chek2 mutation. The patient's last visit with [facility] on 07/04/18. The pt reports that she is doing well overall.   The pt reports that she has had a cold for the last 2-3 weeks and will be seeing her PCP about this tomorrow. She had a sore throat, used chloraseptic which helped. She has had her flu shot and notes that is up to speed with both pneumonia vaccines. She has been using an inhaler but recently ran out. The pt denies having fevers, chills or new body aches.  Besides this, the pt denies developing any new concerns.  Lab results today (10/07/18) of CBC w/diff and CMP is as follows: all values are WNL except for WBC at 61.3k, RBC at 3.20, HGB at 9.7, HCT at 31.1, RDW at 16.9, ANC at 50.8k, Monocytes abs at 1.8k, Eosinophils abs at 900, Calcium at 8.6, Albumin at 3.1, AST at 13, Alk Phos at 208. 10/07/18 LDH at 321  On review of systems, pt reports clear phlegm, concern for infection, and denies fevers, chills, new body aches, new back pains, and any other symptoms.   MEDICAL HISTORY:  Past Medical History:  Diagnosis Date  . Anemia    with menses  . Asthma   . Back pain    status  post surgery 2002  . Breast cyst    Excesion with FNA, begnin in 2004.   . Degenerative joint disease of spine    Imaging 2005,  Degenerative hypertrophic facet arthritis changes L4-5 and L5-S1..   . History of shingles    Recurrent with post herpetic neuralgia.   [name] Hypertension   . Lymphadenopathy    Of the mediastinum, Right side CXR 2008, not read on 2010 cxr.   . Menopause   . Obesity    BMI 54  . Psychosis ([place])    Secondary to prednisone.  . Shingles   . Stasis dermatitis    W/ LE edema, prviously on lasix now on mazxide.   . SVT (supraventricular  tachycardia) [facility]) June 2009   one run while hospitalized  . Tuberculosis    active TB treated in 2002, hx of paraspinal lumbar TB,     SURGICAL HISTORY: Past Surgical History:  Procedure Laterality Date  . BREAST CYST ASPIRATION Left   . Breast cyst biopsy    . CHOLECYSTECTOMY  02/02/2012   Procedure: LAPAROSCOPIC CHOLECYSTECTOMY;  Surgeon: Zenovia Jarred, MD;  Location: [facility];  Service: General;  Laterality: N/A;  . Hemilaminectomy of L4, L5 and S1 decompression of tumor in epidural space  2000    SOCIAL HISTORY: Social History   Socioeconomic History  . Marital status: Widowed    Spouse name: Not on file  . Number of children: Not on file  . Years of education: Not on file  . Highest education level: Not on file  Occupational History  . 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 Occupation: volunteers at [employer]  . Financial resource strain: Not on file  . Food insecurity:    Worry: Not on file    Inability: Not on file  . Transportation needs:    Medical: Not on file    Non-medical: Not on file  Tobacco Use  . Smoking status: Never Smoker  . Smokeless tobacco: Never Used  Substance and Sexual Activity  . Alcohol use: No    Alcohol/week: 0.0 standard drinks  . Drug use: No  . Sexual activity: Never    Birth control/protection: Post-menopausal  Lifestyle  . Physical activity:    Days per week: Not on file    Minutes per session: Not on file  . Stress: Not on file  Relationships  . Social connections:    Talks on phone: Not on file    Gets together: Not on file    Attends religious service: Not on file    Active member of club or organization: Not on file    Attends meetings of clubs or organizations: Not on file    Relationship status: Not on file  . Intimate partner violence:    Fear of current or ex partner: Not on file    Emotionally abused: Not on file    Physically abused: Not on file    Forced sexual activity: Not on  file  Other Topics Concern  . Not on file  Social History Narrative   No regular exercise, retired, single, volunteers at a local school.     FAMILY HISTORY: Family History  Problem Relation Age of Onset  . Stroke Mother        at young age  . Stroke Father        in 60's  . Heart attack Father   . Stroke Brother   . Stroke Brother   .  Breast cancer Other     ALLERGIES:  is allergic to prednisone.  MEDICATIONS:  . Current Outpatient Medications:  .  albuterol (PROAIR HFA) 108 (90 Base) MCG/ACT inhaler, inhale 1 to 2 puffs every 6 hours if needed, Disp: 8.5 g, Rfl: 0 .  amLODipine (NORVASC) 10 MG tablet, TAKE 1 TABLET BY MOUTH EVERY DAY, Disp: 90 tablet, Rfl: 3 .  apixaban (ELIQUIS) 5 MG TABS tablet, Take 1 tablet (5 mg total) by mouth 2 (two) times daily., Disp: 60 tablet, Rfl: 11 .  aspirin EC 81 MG tablet, Take 81 mg by mouth daily., Disp: , Rfl:  .  doxycycline (VIBRA-TABS) 100 MG tablet, Take 1 tablet (100 mg total) by mouth 2 (two) times daily., Disp: 10 tablet, Rfl: 0 .  fexofenadine (ALLEGRA ALLERGY) 180 MG tablet, Take 1 tablet (180 mg total) by mouth daily., Disp: 30 tablet, Rfl: 2 .  fluticasone (FLONASE) 50 MCG/ACT nasal spray, SHAKE LIQUID AND USE 1 SPRAY IN EACH NOSTRIL DAILY, Disp: 16 g, Rfl: 6 .  Fluticasone-Salmeterol (ADVAIR DISKUS) 500-50 MCG/DOSE AEPB, INHALE 1 PUFF BY MOUTH TWICE A DAY, Disp: 180 each, Rfl: 0 .  furosemide (LASIX) 20 MG tablet, Take 1 tablet (20 mg total) by mouth daily as needed for edema., Disp: 30 tablet, Rfl: 6 .  lisinopril (PRINIVIL,ZESTRIL) 5 MG tablet, TAKE 1 TABLET BY MOUTH ONCE DAILY, Disp: 30 tablet, Rfl: 6 .  metoprolol tartrate (LOPRESSOR) 25 MG tablet, TAKE 1/2 TABLET(12.5 MG) BY MOUTH TWICE DAILY, Disp: 60 tablet, Rfl: 6 .  omeprazole (PRILOSEC) 20 MG capsule, TAKE 1 CAPSULE BY MOUTH EVERY DAY, Disp: 90 capsule, Rfl: 3 .  potassium chloride SA (K-DUR,KLOR-CON) 20 MEQ tablet, Take 0.5 tablets (10 mEq total) by mouth daily., Disp:  90 tablet, Rfl: 1 .  pravastatin (PRAVACHOL) 20 MG tablet, Take 1 tablet (20 mg total) by mouth daily., Disp: 90 tablet, Rfl: 3 .  sodium chloride (OCEAN) 0.65 % SOLN nasal spray, Place 1 spray into both nostrils as needed for congestion., Disp: 88 mL, Rfl: 0 .  traMADol (ULTRAM) 50 MG tablet, TAKE 1 TABLET(50 MG) BY MOUTH EVERY 12 HOURS AS NEEDED, Disp: 50 tablet, Rfl: 0   REVIEW OF SYSTEMS:    A 10+ POINT REVIEW OF SYSTEMS WAS OBTAINED including neurology, dermatology, psychiatry, cardiac, respiratory, lymph, extremities, GI, GU, Musculoskeletal, constitutional, breasts, reproductive, HEENT.  All pertinent positives are noted in the HPI.  All others are negative.    PHYSICAL EXAMINATION:  Vitals:   10/07/18 1249  BP: 130/72  Pulse: 85  Resp: 18  Temp: 98.1 F (36.7 C)  TempSrc: Oral  SpO2: 91%  Weight: (!) 300 lb 8 oz (136.3 kg)  Height: 5' 6"  (1.676 m)  .  GENERAL:alert, in no acute distress and comfortable SKIN: no acute rashes, no significant lesions EYES: conjunctiva are pink and non-injected, sclera anicteric OROPHARYNX: MMM, congestion without exudates  NECK: supple, no JVD LYMPH:  no palpable lymphadenopathy in the cervical, axillary or inguinal regions LUNGS: bilateral wheezing without rales HEART: regular rate & rhythm ABDOMEN:  normoactive bowel sounds , non tender, not distended. No palpable hepatosplenomegaly.  Extremity: no pedal edema PSYCH: alert & oriented x 3 with fluent speech NEURO: no focal motor/sensory deficits    LABORATORY DATA:  I have reviewed the data as listed   .       CBC Latest Ref Rng & Units 10/07/2018 07/04/2018 03/06/2018  WBC 4.0 - 10.5 K/uL 61.3(HH) 48.0(H) 48.2(H)  Hemoglobin 12.0 - 15.0  g/dL 9.7(L) 10.3(L) 11.4(L)  Hematocrit 36.0 - 46.0 % 31.1(L) 33.2(L) 35.8  Platelets 150 - 400 K/uL 153 160 166  ANC 40.2k . CBC    Component Value Date/Time   WBC 61.3 (HH) 10/07/2018 1121   RBC 3.20 (L) 10/07/2018 1121   HGB 9.7 (L)  10/07/2018 1121   HGB 11.4 (L) 03/06/2018 0910   HGB 11.3 (L) 07/24/2017 1402   HCT 31.1 (L) 10/07/2018 1121   HCT 36.0 07/24/2017 1402   PLT 153 10/07/2018 1121   PLT 166 03/06/2018 0910   PLT 172 07/24/2017 1402   MCV 97.2 10/07/2018 1121   MCV 93.3 07/24/2017 1402   MCH 30.3 10/07/2018 1121   MCHC 31.2 10/07/2018 1121   RDW 16.9 (H) 10/07/2018 1121   RDW 15.6 (H) 07/24/2017 1402   LYMPHSABS 3.3 10/07/2018 1121   LYMPHSABS 2.9 07/24/2017 1402   MONOABS 1.8 (H) 10/07/2018 1121   MONOABS 1.8 (H) 07/24/2017 1402   EOSABS 0.9 (H) 10/07/2018 1121   EOSABS 1.1 (H) 07/24/2017 1402   BASOSABS 0.1 10/07/2018 1121   BASOSABS 0.2 (H) 07/24/2017 1402   . CMP Latest Ref Rng & Units 10/07/2018 07/04/2018 03/06/2018  Glucose 70 - 99 mg/dL 84 78 82  BUN 8 - 23 mg/dL 8 13 18   Creatinine 0.44 - 1.00 mg/dL 0.95 1.14(H) 1.04(H)  Sodium 135 - 145 mmol/L 140 142 141  Potassium 3.5 - 5.1 mmol/L 4.1 3.8 4.1  Chloride 98 - 111 mmol/L 106 106 103  CO2 22 - 32 mmol/L 27 28 29   Calcium 8.9 - 10.3 mg/dL 8.6(L) 8.8(L) 9.6  Total Protein 6.5 - 8.1 g/dL 7.6 7.8 8.4(H)  Total Bilirubin 0.3 - 1.2 mg/dL 0.4 0.3 0.3  Alkaline Phos 38 - 126 U/L 208(H) 228(H) 221(H)  AST 15 - 41 U/L 13(L) 12(L) 13(L)  ALT 0 - 44 U/L 8 6 10     RADIOGRAPHIC STUDIES: I have personally reviewed the radiological images as listed and agreed with the findings in the report. No results found.  ASSESSMENT & PLAN:   AUDRINNA SHERMAN is a delightful, well-appearing 78 y.o. woman with   #1 Leukocytosis?predominantly Neutrophilia with some monocytosis CMML vs MPN NOS with chek 2 mutation Patient initially presented with a WBC count of 55.2k with 48.8k neutrophils.  WBC counts relatively stable today.  Peripheral blood smear personally previously reviewed-shows significant neutrophilia with left shift.  Increased bands with a few myelocytes and metamyelocytes.  Pelgeroid neutrophils.  No significant increased blasts.  Monocytosis  present.  CT abdomen done for workup of her back pain did not show any acute intra-abdominal or pelvic findings. He does not have any overt splenomegaly on her CT abdomen -which is somewhat argues against a chronic myeloproliferative disorder though does not rule this out.  MRI of the lumbar spine shows old surgical and degenerative changes but no evidence of tumor or infectious collections.  Leucocytosis - improved at 34.3k today. No fevers/chills or other constitutional symptoms  Foundation One heme with Chek 2 mutation. No other findings noted.  CT chest on 05/31/2017: IMPRESSION: 1. Mild ground-glass opacities suggest mild pulmonary edema. Small bilateral pleural effusions and basilar atelectasis. No pulmonary infiltrate. Mild subcarinal mediastinal adenopathy is likely reactive. Cardiomegaly.  JAK2 and BCR-ABL mutations neg. LDH levels were within normal limits and did not suggest a high-grade lymphoma.  06/24/17 BM bx- this showed hypercellular bone marrow with granulocytic proliferation. Cytogenetic analysis was normal.   #2 mild normocytic anemia. HGb at 11.4 today  PLAN:  -Discussed pt labwork today, 10/07/18; WBC increased to 61.3k, HGB reduced to 9.7, PLT remain normal, LDH at 321 -Discussed that the patient's labs do reveal either response to current infection vs signs of progression -Suspect pt has bronchitis but will check the patient's labs at a sooner interval in 3 weeks to monitor -Will order viral panel -Will empirically begin pt on antibiotics -Will refill Albuterol rescue inhaler -Ms Stmarie currently meets the criteria of an unclassifiable myeloproliferative neoplasm or MPN/MDS with a chek 2 mutation.  -Discussed with pt that if her WBC counts increase significantly, or she develops constitutional symptoms, or liver or spleen sizes increase, we will recommend Hydroxyurea. -Recommend pt stay up to date with her age appropriate cancer screenings with her PCP. -We  offered to refer the pt to receive a second opinion if she would like this; she notes that she would like to wait on this for now.  -No indication for Hydroxyurea at this time -Will see the pt back in 3 weeks   Viral swab today RTC with Dr Irene Limbo with labs in 3 weeks   The total time spent in the appt was 25 minutes and more than 50% was on counseling and direct patient cares.   Sullivan Lone MD [place] AAHIVMS [facility] Hematology/Oncology Physician [facility]  (Office):       [phone] (Work cell):  [phone] (Fax):           [phone]  I, Baldwin [place], am acting as a scribe for Dr. Sullivan Lone.   .I have reviewed the above documentation for accuracy and completeness, and I agree with the above. Brunetta Genera MD

## 2018-10-07 ENCOUNTER — Inpatient Hospital Stay: Payer: PPO

## 2018-10-07 ENCOUNTER — Inpatient Hospital Stay: Payer: PPO | Attending: Hematology | Admitting: Hematology

## 2018-10-07 ENCOUNTER — Telehealth: Payer: Self-pay | Admitting: Hematology

## 2018-10-07 ENCOUNTER — Other Ambulatory Visit: Payer: Self-pay | Admitting: Hematology

## 2018-10-07 VITALS — BP 130/72 | HR 85 | Temp 98.1°F | Resp 18 | Ht 66.0 in | Wt 300.5 lb

## 2018-10-07 DIAGNOSIS — E669 Obesity, unspecified: Secondary | ICD-10-CM | POA: Insufficient documentation

## 2018-10-07 DIAGNOSIS — D471 Chronic myeloproliferative disease: Secondary | ICD-10-CM | POA: Diagnosis not present

## 2018-10-07 DIAGNOSIS — Z6841 Body Mass Index (BMI) 40.0 and over, adult: Secondary | ICD-10-CM | POA: Diagnosis not present

## 2018-10-07 DIAGNOSIS — D649 Anemia, unspecified: Secondary | ICD-10-CM | POA: Diagnosis not present

## 2018-10-07 DIAGNOSIS — J40 Bronchitis, not specified as acute or chronic: Secondary | ICD-10-CM | POA: Insufficient documentation

## 2018-10-07 DIAGNOSIS — D72828 Other elevated white blood cell count: Secondary | ICD-10-CM

## 2018-10-07 DIAGNOSIS — M479 Spondylosis, unspecified: Secondary | ICD-10-CM | POA: Diagnosis not present

## 2018-10-07 DIAGNOSIS — J4530 Mild persistent asthma, uncomplicated: Secondary | ICD-10-CM

## 2018-10-07 DIAGNOSIS — I471 Supraventricular tachycardia: Secondary | ICD-10-CM | POA: Insufficient documentation

## 2018-10-07 DIAGNOSIS — I10 Essential (primary) hypertension: Secondary | ICD-10-CM | POA: Insufficient documentation

## 2018-10-07 LAB — CBC WITH DIFFERENTIAL/PLATELET
Abs Immature Granulocytes: 4.42 10*3/uL — ABNORMAL HIGH (ref 0.00–0.07)
Basophils Absolute: 0.1 10*3/uL (ref 0.0–0.1)
Basophils Relative: 0 %
Eosinophils Absolute: 0.9 10*3/uL — ABNORMAL HIGH (ref 0.0–0.5)
Eosinophils Relative: 1 %
HCT: 31.1 % — ABNORMAL LOW (ref 36.0–46.0)
Hemoglobin: 9.7 g/dL — ABNORMAL LOW (ref 12.0–15.0)
Immature Granulocytes: 7 %
Lymphocytes Relative: 5 %
Lymphs Abs: 3.3 10*3/uL (ref 0.7–4.0)
MCH: 30.3 pg (ref 26.0–34.0)
MCHC: 31.2 g/dL (ref 30.0–36.0)
MCV: 97.2 fL (ref 80.0–100.0)
Monocytes Absolute: 1.8 10*3/uL — ABNORMAL HIGH (ref 0.1–1.0)
Monocytes Relative: 3 %
Neutro Abs: 50.8 10*3/uL — ABNORMAL HIGH (ref 1.7–7.7)
Neutrophils Relative %: 84 %
Platelets: 153 10*3/uL (ref 150–400)
RBC: 3.2 MIL/uL — ABNORMAL LOW (ref 3.87–5.11)
RDW: 16.9 % — ABNORMAL HIGH (ref 11.5–15.5)
WBC: 61.3 10*3/uL — AB (ref 4.0–10.5)
nRBC: 0 % (ref 0.0–0.2)

## 2018-10-07 LAB — CMP (CANCER CENTER ONLY)
ALT: 8 U/L (ref 0–44)
AST: 13 U/L — AB (ref 15–41)
Albumin: 3.1 g/dL — ABNORMAL LOW (ref 3.5–5.0)
Alkaline Phosphatase: 208 U/L — ABNORMAL HIGH (ref 38–126)
Anion gap: 7 (ref 5–15)
BUN: 8 mg/dL (ref 8–23)
CO2: 27 mmol/L (ref 22–32)
Calcium: 8.6 mg/dL — ABNORMAL LOW (ref 8.9–10.3)
Chloride: 106 mmol/L (ref 98–111)
Creatinine: 0.95 mg/dL (ref 0.44–1.00)
GFR, EST NON AFRICAN AMERICAN: 59 mL/min — AB (ref >60)
GFR, Est AFR Am: >60 mL/min (ref >60)
Glucose, Bld: 84 mg/dL (ref 70–99)
Potassium: 4.1 mmol/L (ref 3.5–5.1)
Sodium: 140 mmol/L (ref 135–145)
Total Bilirubin: 0.4 mg/dL (ref 0.3–1.2)
Total Protein: 7.6 g/dL (ref 6.5–8.1)

## 2018-10-07 LAB — INFLUENZA PANEL BY PCR (TYPE A & B)
INFLBPCR: NEGATIVE
Influenza A By PCR: NEGATIVE

## 2018-10-07 LAB — LACTATE DEHYDROGENASE: LDH: 321 U/L — ABNORMAL HIGH (ref 98–192)

## 2018-10-07 MED ORDER — ALBUTEROL SULFATE HFA 108 (90 BASE) MCG/ACT IN AERS: INHALATION_SPRAY | RESPIRATORY_TRACT | 0 refills | Status: DC

## 2018-10-07 MED ORDER — FLUTICASONE-SALMETEROL 500-50 MCG/DOSE IN AEPB: INHALATION_SPRAY | RESPIRATORY_TRACT | 0 refills | Status: DC

## 2018-10-07 MED ORDER — DOXYCYCLINE HYCLATE 100 MG PO TABS: 100.0000 mg | ORAL_TABLET | Freq: Two times a day (BID) | ORAL | 0 refills | Status: DC

## 2018-10-07 NOTE — Telephone Encounter (Signed)
Gave patient avs report and appointments for March  °

## 2018-10-08 ENCOUNTER — Other Ambulatory Visit: Payer: Self-pay

## 2018-10-08 ENCOUNTER — Encounter: Payer: Self-pay | Admitting: Internal Medicine

## 2018-10-08 ENCOUNTER — Ambulatory Visit (INDEPENDENT_AMBULATORY_CARE_PROVIDER_SITE_OTHER): Payer: PPO | Admitting: Internal Medicine

## 2018-10-08 DIAGNOSIS — Z792 Long term (current) use of antibiotics: Secondary | ICD-10-CM

## 2018-10-08 DIAGNOSIS — I10 Essential (primary) hypertension: Secondary | ICD-10-CM | POA: Diagnosis not present

## 2018-10-08 DIAGNOSIS — M479 Spondylosis, unspecified: Secondary | ICD-10-CM

## 2018-10-08 DIAGNOSIS — E785 Hyperlipidemia, unspecified: Secondary | ICD-10-CM | POA: Diagnosis not present

## 2018-10-08 DIAGNOSIS — Z79899 Other long term (current) drug therapy: Secondary | ICD-10-CM | POA: Diagnosis not present

## 2018-10-08 DIAGNOSIS — E876 Hypokalemia: Secondary | ICD-10-CM | POA: Diagnosis not present

## 2018-10-08 DIAGNOSIS — Z7901 Long term (current) use of anticoagulants: Secondary | ICD-10-CM | POA: Diagnosis not present

## 2018-10-08 DIAGNOSIS — Z79891 Long term (current) use of opiate analgesic: Secondary | ICD-10-CM | POA: Diagnosis not present

## 2018-10-08 DIAGNOSIS — D72829 Elevated white blood cell count, unspecified: Secondary | ICD-10-CM

## 2018-10-08 DIAGNOSIS — I480 Paroxysmal atrial fibrillation: Secondary | ICD-10-CM

## 2018-10-08 DIAGNOSIS — J4540 Moderate persistent asthma, uncomplicated: Secondary | ICD-10-CM

## 2018-10-08 DIAGNOSIS — E7800 Pure hypercholesterolemia, unspecified: Secondary | ICD-10-CM

## 2018-10-08 NOTE — Assessment & Plan Note (Signed)
On eliquis and metoprolol.  Her pulse was well controlled today at 68.  She denies any palpitations, bleeding issues.  She has been doing well on her current dose of medications.   Plan Continue eliquis and metoprolol

## 2018-10-08 NOTE — Patient Instructions (Signed)
Erika [name] - -  Thank you for coming in to see me today.    Please continue your antibiotics until completed.   Please reschedule your colonoscopy and mammogram as soon as you are able.   Please come back to see me in 6 months, sooner if you should need.   Thank you!

## 2018-10-08 NOTE — Assessment & Plan Note (Signed)
Slightly worsened with her current viral illness.  She was out of her advair, but this was filled by Dr. Irene Limbo.  She does not have an acute exacerbation at this time.  Vitals were stable.  She was breathing comfortably on room air with no wheezing in the exam room.   Plan Continue advair

## 2018-10-08 NOTE — Assessment & Plan Note (Signed)
She has persistently elevated WBC which is being followed by a hematologist.  Given her acute infection right now, Dr. Irene Limbo plans to repeat her CBC in 3 weeks to see if further work up is warranted.  She has had an increase from around 40 to 60.  This could be progression or in related to her acute infection.  For her respiratory infection, she was placed on doxycycline.   I advised her to complete the entire course of doxycycline and to follow up with Dr. Irene Limbo as planned.

## 2018-10-08 NOTE — Assessment & Plan Note (Signed)
Last LDL was 65, checked quite a while ago.  She just had blood work yesterday and is due for repeat in 3 weeks. I will check a cholesterol panel at her next visit with me.   In the meantime, continue pravastatin.

## 2018-10-08 NOTE — Progress Notes (Signed)
   Subjective:    Patient ID: Erika [name], female    DOB: 1940/11/11, 78 y.o.   MRN: [id number]  6 month follow up for HTN, GERD, OA  HPI  Erika [name] is a 78 year old woman with PMH of HLD, HTN, asthma, GERD, obesity, OA, AF, hypokalemia who presents for follow up.   Ms [name] reports that she has been having cold like symptoms including cough, SOB with exertion.  She attempted to get in to contact with our clinic, but after multiple phone calls we were not able to speak with her.  She presented to her hematologist yesterday (10/07/18) where she follows for unexplained leukocytosis.  Labs were checked at that time showing her WBC had increased from around 40 to over 60.  She had a flu swab which was negative and RVP is pending.  She was started on doxycycline.  She states that she is feeling a little better.    Her BP was initially elevated and then on recheck was elevated again.  At her hematologist office yesterday, her BP was at goal.  She is on the same regimen of medication.    She is due for a colonoscopy and mammogram.  She has had difficulty scheduling because her son had a stroke and she did not have transportation for a while.  I advised her to try and schedule now if things have gotten better for her for routine screening.  Her last colonoscopy was in 2004.     Review of Systems  Constitutional: Negative for activity change, appetite change, chills and fever.  Respiratory: Positive for cough and shortness of breath. Negative for choking and chest tightness.   Cardiovascular: Negative for chest pain and leg swelling.  Gastrointestinal: Negative for constipation, diarrhea, nausea and vomiting.  Genitourinary: Negative for difficulty urinating and dysuria.  Musculoskeletal: Negative for arthralgias and back pain.  Skin: Negative for rash and wound.  Neurological: Negative for dizziness, weakness and light-headedness.       Objective:   Physical Exam Constitutional:    General: She is not in acute distress.    Appearance: She is obese. She is not toxic-appearing.  HENT:     Head: Normocephalic and atraumatic.  Cardiovascular:     Rate and Rhythm: Normal rate and regular rhythm.     Heart sounds: No murmur.  Pulmonary:     Effort: Pulmonary effort is normal.     Comments: Breath sounds are decreased throughout, no wheezing Skin:    General: Skin is warm and dry.  Neurological:     General: No focal deficit present.     Mental Status: She is alert. Mental status is at baseline.  Psychiatric:        Mood and Affect: Mood normal.        Behavior: Behavior normal.     She is due for follow up with Dr. Irene Limbo in 3 weeks and will have blood work at that time.  I reviewed her bloodwork from 10/07/18.       Assessment & Plan:  Return to clinic in 6 months, sooner if needed

## 2018-10-08 NOTE — Assessment & Plan Note (Signed)
She is on supplementation and since that time her K has stayed in the normal range.  This is likely related to her lasix dosing.   Plan Continue Potassium chloride 51meq/day

## 2018-10-08 NOTE — Assessment & Plan Note (Signed)
She takes chronic tramadol for her OA and uses a cane.  She has had no falls.  Pain is well controlled.  I reviewed the PDMP and it is appropriate.    Continue PRN tramadol for pain.

## 2018-10-08 NOTE — Assessment & Plan Note (Signed)
BP today was elevated, and again on recheck.  However, she has had controlled BP on current regimen for at least a year.  Her BP was controlled at her hematologist office yesterday.  I would not opt to change her medications today. I have a feeling she either forgot one of her medications, or is taking something for her cold which is raising her BP.   Plan Continue amlodipine, lisinopril, lasix, metoprolol I asked her to let the clinic know if her BP is still high when she goes to see her hematologist in 3 weeks.

## 2018-10-09 LAB — RESPIRATORY VIRUS PANEL
Adenovirus: NEGATIVE
Influenza A: NEGATIVE
Influenza B: NEGATIVE
Metapneumovirus: NEGATIVE
Parainfluenza 1: NEGATIVE
Parainfluenza 2: NEGATIVE
Parainfluenza 3: NEGATIVE
Respiratory Syncytial Virus A: NEGATIVE
Respiratory Syncytial Virus B: NEGATIVE
Rhinovirus: NEGATIVE

## 2018-10-28 ENCOUNTER — Telehealth: Payer: Self-pay | Admitting: *Deleted

## 2018-10-28 NOTE — Telephone Encounter (Signed)
Attempted to contact patient. Left voice mail with following directions per Dr. Irene Limbo: If bronchial symptoms noted at last visit have not cleared, will need to reschedule appts for 3/25. If asymptomatic, will see 3/25. Please contact office [phone] in AM if not coming. Thank you!

## 2018-10-29 ENCOUNTER — Inpatient Hospital Stay: Payer: PPO | Admitting: Hematology

## 2018-10-29 ENCOUNTER — Telehealth: Payer: Self-pay | Admitting: *Deleted

## 2018-10-29 ENCOUNTER — Inpatient Hospital Stay: Payer: PPO

## 2018-10-29 ENCOUNTER — Other Ambulatory Visit: Payer: Self-pay | Admitting: Hematology

## 2018-10-29 ENCOUNTER — Telehealth: Payer: Self-pay | Admitting: Hematology

## 2018-10-29 DIAGNOSIS — J4530 Mild persistent asthma, uncomplicated: Secondary | ICD-10-CM

## 2018-10-29 NOTE — Telephone Encounter (Signed)
Scheduled appt per 3/25 sch message - unable to reach patient - left message and sent reminder letter in the mail.   

## 2018-10-29 NOTE — Telephone Encounter (Signed)
Patient continues to have mild cold symptoms. Per Dr. Irene Limbo, reschedule 3/25 appt, make appt for 2-3 weeks to allow healing. Advised patient if any SOB, or temp over 100 to contact PCP. Patient verbalizes understanding. Schedule msg sent.

## 2018-11-10 ENCOUNTER — Other Ambulatory Visit: Payer: Self-pay | Admitting: Internal Medicine

## 2018-11-19 ENCOUNTER — Inpatient Hospital Stay: Payer: PPO | Admitting: Hematology

## 2018-11-19 ENCOUNTER — Inpatient Hospital Stay: Payer: PPO | Attending: Hematology

## 2018-12-10 ENCOUNTER — Other Ambulatory Visit: Payer: Self-pay | Admitting: *Deleted

## 2018-12-10 DIAGNOSIS — I480 Paroxysmal atrial fibrillation: Secondary | ICD-10-CM

## 2018-12-10 MED ORDER — APIXABAN 5 MG PO TABS: 5.0000 mg | ORAL_TABLET | Freq: Two times a day (BID) | ORAL | 11 refills | Status: DC

## 2018-12-19 ENCOUNTER — Other Ambulatory Visit: Payer: Self-pay | Admitting: Internal Medicine

## 2019-03-05 ENCOUNTER — Other Ambulatory Visit: Payer: Self-pay | Admitting: Internal Medicine

## 2019-03-18 ENCOUNTER — Other Ambulatory Visit: Payer: Self-pay | Admitting: Internal Medicine

## 2019-03-18 NOTE — Telephone Encounter (Signed)
Next appt 8/14 w/pcp

## 2019-03-19 ENCOUNTER — Telehealth: Payer: Self-pay | Admitting: *Deleted

## 2019-03-19 NOTE — Telephone Encounter (Signed)
Are those numbers correct, that is severe PAD in the R Leg.  Can you confirm this?

## 2019-03-19 NOTE — Telephone Encounter (Signed)
[employer] housecalls nurse calls-mia- to report PAD screening L leg 0.98 R leg 0.18 Questions Mia, [phone]

## 2019-03-19 NOTE — Telephone Encounter (Signed)
Spoke to dr Daryll Drown, had repeated values back to Mia 3 times, tried to call pt lm for rt, rtc to [facility], states vmail is full

## 2019-03-20 ENCOUNTER — Other Ambulatory Visit: Payer: Self-pay

## 2019-03-20 ENCOUNTER — Ambulatory Visit (INDEPENDENT_AMBULATORY_CARE_PROVIDER_SITE_OTHER): Payer: PPO | Admitting: Internal Medicine

## 2019-03-20 ENCOUNTER — Encounter: Payer: Self-pay | Admitting: Internal Medicine

## 2019-03-20 VITALS — BP 138/88 | HR 88 | Ht 67.0 in | Wt 288.0 lb

## 2019-03-20 DIAGNOSIS — Z1231 Encounter for screening mammogram for malignant neoplasm of breast: Secondary | ICD-10-CM

## 2019-03-20 DIAGNOSIS — Z0000 Encounter for general adult medical examination without abnormal findings: Secondary | ICD-10-CM | POA: Diagnosis not present

## 2019-03-20 DIAGNOSIS — E2839 Other primary ovarian failure: Secondary | ICD-10-CM

## 2019-03-20 NOTE — Addendum Note (Signed)
Addended by: Velora Heckler on: 03/20/2019 01:37 PM   Modules accepted: Orders

## 2019-03-20 NOTE — Telephone Encounter (Signed)
Attempted to call pt, no answer, lm for rtc

## 2019-03-20 NOTE — Progress Notes (Signed)
This AWV is being conducted by [place] only. The patient was located at home and I was located in [facility]. The patient's identity was confirmed using their DOB and current address. The patient or his/her legal guardian has consented to being evaluated through a telephone encounter and understands the associated risks (an examination cannot be done and the patient may need to come in for an appointment) / benefits (allows the patient to remain at home, decreasing exposure to coronavirus). I personally spent 46 minutes conducting the AWV.  Subjective:   Erika [name] is a 78 y.o. female who presents for a Medicare Annual Wellness Visit.  The following items have been reviewed and updated today in the appropriate area in the EMR.   Health Risk Assessment  Height, weight, BMI, and BP Visual acuity if needed Depression screen Fall risk / safety level Advance directive discussion Medical and family history were reviewed and updated Updating list of other providers & suppliers Medication reconciliation, including over the counter medicines Cognitive screen Written screening schedule Risk Factor list Personalized health advice, risky behaviors, and treatment advice  Social History   Social History Narrative   Current Social History 03/20/2019        Patient lives alone in a ground floor apartment which is 1 story. There are not steps up to the entrance the patient uses. One son stays with her once in a while.      Patient's method of transportation is personal car.      The highest level of education was some high school: 11th grade.      The patient currently retired.      Identified important Relationships are "My friend Mart Piggs, my nieces, my grandkids, a lot of people.       Pets : None       Interests / Fun: "Playing word games on my phone, watch TV, Talking on phone."       Current Stressors: "Nothing, really. I try not to let anything stress me."       Religious / Personal Beliefs: "I believe in God."       L. Johnthomas Lader, RN, BSN         Objective:    Vitals: BP 138/88   Pulse 88   Ht 5\' 7"  (1.702 m)   Wt 288 lb (130.6 kg)   BMI 45.11 kg/m  Vitals are patient reported from visit with [facility] yesterday.  Activities of Daily Living In your present state of health, do you have any difficulty performing the following activities: 03/20/2019 10/08/2018  Hearing? N N  Vision? N N  Difficulty concentrating or making decisions? N N  Walking or climbing stairs? Y Y  Dressing or bathing? N N  Doing errands, shopping? Y N  Some recent data might be hidden    Goals Goals    . Exercise 3x per week (30 min per time)     Seated and standing exercises with yellow band    . Weight (lb) < 274 lb (124.3 kg) (pt-stated)     5% weight loss       Fall Risk Fall Risk  03/20/2019 10/08/2018 03/12/2018 02/14/2018 10/31/2017  Falls in the past year? 0 0 No No No  Risk for fall due to : Impaired balance/gait;Impaired mobility - - - -  Risk for fall due to: Comment - - - - -  Follow up Education provided;Falls prevention discussed Falls prevention discussed - - -  CDC Handout on Fall Prevention and Handout on Home Exercise Program, Access codes [email or web address] and [email or web address] mailed to patient with yellow exercise band.    Depression Screen PHQ 2/9 Scores 03/20/2019 10/08/2018 03/12/2018 02/14/2018  PHQ - 2 Score 0 0 0 0  PHQ- 9 Score 0 - - -  Exception Documentation - - - -     Cognitive Testing Six-Item Cognitive Screener   "I would like to ask you some questions that ask you to use your memory. I am going to name three objects. Please wait until I say all three words, then repeat them. Remember what they are  because I am going to ask you to name them again in a few minutes. Please repeat these words for me: APPLE-TABLE-PENNY." (Interviewer may repeat names 3 times if necessary but repetition not scored.)  Did patient correctly repeat all three words? Yes -  may proceed with screen  What year is this? Correct What month is this? Correct What day of the week is this? Correct  What were the three objects I asked you to remember? . Apple Correct . Table Correct . Penny Correct  Score one point for each incorrect answer.  A score of 2 or more points warrants additional investigation.  Patient's score 0   Assessment and Plan:      Patient currently not taking lisinopril or pravastatin. Will route to PCP to advise.  Discussed results of yesterday's [facility] RN visit with patient. She states there was protein in her urine in addition to the PAD of right leg. No available appts with PCP at this time. Will route to PCP to advise.  During the course of the visit the patient was educated and counseled about appropriate screening and preventive services as documented in the assessment and plan.  The printed AVS was given to the patient and included an updated screening schedule, a list of risk factors, and personalized health advice.        Velora Heckler, RN  03/20/2019

## 2019-03-20 NOTE — Progress Notes (Signed)
I discussed the AWV findings with the RN who conducted the visit. I was present in the office suite and immediately available to provide assistance and direction throughout the time the service was provided.   

## 2019-03-20 NOTE — Progress Notes (Signed)
Internal Medicine Clinic Attending  Case discussed with Dr. Eileen Stanford  soon after the resident saw the patient.  We reviewed the resident's history and exam and pertinent patient test results.  I agree with the assessment, diagnosis, and plan of care documented in the resident's note.

## 2019-03-20 NOTE — Patient Instructions (Addendum)
Annual Wellness Visit   Medicare Covered Preventative Screenings and Services  Services & Screenings Men and Women Who How Often Need? Date of Last Service Action  Abdominal Aortic Aneurysm Adults with AAA risk factors Once     Alcohol Misuse and Counseling All Adults Screening once a year if no alcohol misuse. Counseling up to 4 face to face sessions.     Bone Density Measurement  Adults at risk for osteoporosis Once every 2 yrs Yes 2014 [facility] call [facility] at [phone] to schedule.  Lipid Panel Z13.6 All adults without CV disease Once every 5 yrs   2017  LDL = 65  Colorectal Cancer   Stool sample or  Colonoscopy All adults 36 and older   Once every year  Every 10 years   Yes  2004  Please call your GI doctor to schedule a colonoscopy  Depression All Adults Once a year  Today   Diabetes Screening Blood glucose, post glucose load, or GTT Z13.1  All adults at risk  Pre-diabetics  Once per year  Twice per year   2020  Blood sugar = 84  Diabetes  Self-Management Training All adults Diabetics 10 hrs first year; 2 hours subsequent years. Requires Copay     Glaucoma  Diabetics  Family history of glaucoma  African Americans 50 yrs +  Hispanic Americans 65 yrs + Annually - requires coppay  Yes   Will place a referral to an eye doctor for you.  Hepatitis C Z72.89 or F19.20  High Risk for HCV  Born between 1945 and 1965  Annually  Once   2013   Normal   HIV Z11.4 All adults based on risk  Annually btw ages 57 & 53 regardless of risk  Annually > 65 yrs if at increased risk   2013   Normal   Lung Cancer Screening Asymptomatic adults aged 41-77 with 30 pack yr history and current smoker OR quit within the last 15 yrs Annually Must have counseling and shared decision making documentation before first screen     Medical Nutrition Therapy Adults with   Diabetes  Renal disease  Kidney transplant within past 3 yrs 3 hours first year; 2 hours  subsequent years    Will place referral to [employer]  for help with weight loss goal and healthy food choices.  Obesity and Counseling All adults Screening once a year Counseling if BMI 30 or higher  Today   Tobacco Use Counseling Adults who use tobacco  Up to 8 visits in one year     Vaccines Z23  Hepatitis B  Influenza   Pneumonia  Adults   Once  Once every flu season  Two different vaccines separated by one year    Flu vaccine starting September 1  You have completed both the pneumonia vaccines.  Next Annual Wellness Visit People with Medicare Every year  Today     [place] Women Who How Often Need  Date of Last Service Action  Mammogram  Z12.31 Women over 71 One baseline ages 78-39. Annually ager 69 yrs+ Yes 2018 Please call [facility] at [phone] to schedule.  Pap tests All women Annually if high risk. Every 2 yrs for normal risk women   2008  No longer needed  Screening for cervical cancer with   Pap (Z01.419 nl or Z01.411abnl) &  HPV Z11.51 Women aged 16 to 27 Once every 5 yrs    No longer needed  Screening pelvic and breast  exams All women Annually if high risk. Every 2 yrs for normal risk women    Consider at next visit  Sexually Transmitted Diseases  Chlamydia  Gonorrhea  Syphilis All at risk adults Annually for non pregnant females at increased risk         [place] Men Who How Ofter Need  Date of Last Service Action  Prostate Cancer - DRE & PSA Men over 50 Annually.  DRE might require a copay.     Sexually Transmitted Diseases  Syphilis All at risk adults Annually for men at increased risk         Things That May Be Affecting Your Health:  Alcohol  Hearing loss X Pain    Depression  Home Safety  Sexual Health   Diabetes  Lack of physical activity  Stress   Difficulty with daily activities  Loneliness  Tiredness   Drug use  Medicines  Tobacco use   Falls  Motor Vehicle Safety X Weight   Food  choices  Oral Health  Other    YOUR PERSONALIZED HEALTH PLAN : 1. Schedule your next subsequent Medicare Wellness visit in one year 2. Attend all of your regular appointments to address your medical issues 3. Complete the preventative screenings and services 4. Please call [facility] at [phone] to schedule Dexa Scan and Mammogram 5. Please call your GI doctor to schedule a colonoscopy 6. Congratulations on your 5% weight loss goal!! I have placed a referral to [facility] to help you with this and for healthy food choices. 7. Begin seated and standing exercises with yellow exercise band.    Fall Prevention in the Home, Adult Falls can cause injuries. They can happen to people of all ages. There are many things you can do to make your home safe and to help prevent falls. Ask for help when making these changes, if needed. What actions can I take to prevent falls? General Instructions  Use good lighting in all rooms. Replace any light bulbs that burn out.  Turn on the lights when you go into a dark area. Use night-lights.  Keep items that you use often in easy-to-reach places. Lower the shelves around your home if necessary.  Set up your furniture so you have a clear path. Avoid moving your furniture around.  Do not have throw rugs and other things on the floor that can make you trip.  Avoid walking on wet floors.  If any of your floors are uneven, fix them.  Add color or contrast paint or tape to clearly mark and help you see: ? Any grab bars or handrails. ? First and last steps of stairways. ? Where the edge of each step is.  If you use a stepladder: ? Make sure that it is fully opened. Do not climb a closed stepladder. ? Make sure that both sides of the stepladder are locked into place. ? Ask someone to hold the stepladder for you while you use it.  If there are any pets around you, be aware of where they are. What can I do in the bathroom?      Keep the floor  dry. Clean up any water that spills onto the floor as soon as it happens.  Remove soap buildup in the tub or shower regularly.  Use non-skid mats or decals on the floor of the tub or shower.  Attach bath mats securely with double-sided, non-slip rug tape.  If you need to sit down in the  shower, use a plastic, non-slip stool.  Install grab bars by the toilet and in the tub and shower. Do not use towel bars as grab bars. What can I do in the bedroom?  Make sure that you have a light by your bed that is easy to reach.  Do not use any sheets or blankets that are too big for your bed. They should not hang down onto the floor.  Have a firm chair that has side arms. You can use this for support while you get dressed. What can I do in the kitchen?  Clean up any spills right away.  If you need to reach something above you, use a strong step stool that has a grab bar.  Keep electrical cords out of the way.  Do not use floor polish or wax that makes floors slippery. If you must use wax, use non-skid floor wax. What can I do with my stairs?  Do not leave any items on the stairs.  Make sure that you have a light switch at the top of the stairs and the bottom of the stairs. If you do not have them, ask someone to add them for you.  Make sure that there are handrails on both sides of the stairs, and use them. Fix handrails that are broken or loose. Make sure that handrails are as long as the stairways.  Install non-slip stair treads on all stairs in your home.  Avoid having throw rugs at the top or bottom of the stairs. If you do have throw rugs, attach them to the floor with carpet tape.  Choose a carpet that does not hide the edge of the steps on the stairway.  Check any carpeting to make sure that it is firmly attached to the stairs. Fix any carpet that is loose or worn. What can I do on the outside of my home?  Use bright outdoor lighting.  Regularly fix the edges of walkways and  driveways and fix any cracks.  Remove anything that might make you trip as you walk through a door, such as a raised step or threshold.  Trim any bushes or trees on the path to your home.  Regularly check to see if handrails are loose or broken. Make sure that both sides of any steps have handrails.  Install guardrails along the edges of any raised decks and porches.  Clear walking paths of anything that might make someone trip, such as tools or rocks.  Have any leaves, snow, or ice cleared regularly.  Use sand or salt on walking paths during winter.  Clean up any spills in your garage right away. This includes grease or oil spills. What other actions can I take?  Wear shoes that: ? Have a low heel. Do not wear high heels. ? Have rubber bottoms. ? Are comfortable and fit you well. ? Are closed at the toe. Do not wear open-toe sandals.  Use tools that help you move around (mobility aids) if they are needed. These include: ? Canes. ? Walkers. ? Scooters. ? Crutches.  Review your medicines with your doctor. Some medicines can make you feel dizzy. This can increase your chance of falling. Ask your doctor what other things you can do to help prevent falls. Where to find more information  Centers for Disease Control and Prevention, STEADI: https://garcia.biz/  [employer] on Aging: BrainJudge.co.uk Contact a doctor if:  You are afraid of falling at home.  You feel weak, drowsy, or dizzy at  home.  You fall at home. Summary  There are many simple things that you can do to make your home safe and to help prevent falls.  Ways to make your home safe include removing tripping hazards and installing grab bars in the bathroom.  Ask for help when making these changes in your home. This information is not intended to replace advice given to you by your health care provider. Make sure you discuss any questions you have with your health care provider. Document  Released: 05/19/2009 Document Revised: 11/13/2018 Document Reviewed: 03/07/2017 Elsevier Patient Education  2020 [place] Maintenance, Female Adopting a healthy lifestyle and getting preventive care are important in promoting health and wellness. Ask your health care provider about:  The right schedule for you to have regular tests and exams.  Things you can do on your own to prevent diseases and keep yourself healthy. What should I know about diet, weight, and exercise? Eat a healthy diet   Eat a diet that includes plenty of vegetables, fruits, low-fat dairy products, and lean protein.  Do not eat a lot of foods that are high in solid fats, added sugars, or sodium. Maintain a healthy weight Body mass index (BMI) is used to identify weight problems. It estimates body fat based on height and weight. Your health care provider can help determine your BMI and help you achieve or maintain a healthy weight. Get regular exercise Get regular exercise. This is one of the most important things you can do for your health. Most adults should:  Exercise for at least 150 minutes each week. The exercise should increase your heart rate and make you sweat (moderate-intensity exercise).  Do strengthening exercises at least twice a week. This is in addition to the moderate-intensity exercise.  Spend less time sitting. Even light physical activity can be beneficial. Watch cholesterol and blood lipids Have your blood tested for lipids and cholesterol at 76 years of age, then have this test every 5 years. Have your cholesterol levels checked more often if:  Your lipid or cholesterol levels are high.  You are older than 76 years of age.  You are at high risk for heart disease. What should I know about cancer screening? Depending on your health history and family history, you may need to have cancer screening at various ages. This may include screening for:  Breast cancer.  Cervical  cancer.  Colorectal cancer.  Skin cancer.  Lung cancer. What should I know about heart disease, diabetes, and high blood pressure? Blood pressure and heart disease  High blood pressure causes heart disease and increases the risk of stroke. This is more likely to develop in people who have high blood pressure readings, are of African descent, or are overweight.  Have your blood pressure checked: ? Every 3-5 years if you are 62-77 years of age. ? Every year if you are 18 years old or older. Diabetes Have regular diabetes screenings. This checks your fasting blood sugar level. Have the screening done:  Once every three years after age 60 if you are at a normal weight and have a low risk for diabetes.  More often and at a younger age if you are overweight or have a high risk for diabetes. What should I know about preventing infection? Hepatitis B If you have a higher risk for hepatitis B, you should be screened for this virus. Talk with your health care provider to find out if you are at risk for hepatitis  B infection. Hepatitis C Testing is recommended for:  Everyone born from [birth year] through 1963.  Anyone with known risk factors for hepatitis C. Sexually transmitted infections (STIs)  Get screened for STIs, including gonorrhea and chlamydia, if: ? You are sexually active and are younger than 76 years of age. ? You are older than 77 years of age and your health care provider tells you that you are at risk for this type of infection. ? Your sexual activity has changed since you were last screened, and you are at increased risk for chlamydia or gonorrhea. Ask your health care provider if you are at risk.  Ask your health care provider about whether you are at high risk for HIV. Your health care provider may recommend a prescription medicine to help prevent HIV infection. If you choose to take medicine to prevent HIV, you should first get tested for HIV. You should then be tested every 3  months for as long as you are taking the medicine. Pregnancy  If you are about to stop having your period (premenopausal) and you may become pregnant, seek counseling before you get pregnant.  Take 400 to 800 micrograms (mcg) of folic acid every day if you become pregnant.  Ask for birth control (contraception) if you want to prevent pregnancy. Osteoporosis and menopause Osteoporosis is a disease in which the bones lose minerals and strength with aging. This can result in bone fractures. If you are 17 years old or older, or if you are at risk for osteoporosis and fractures, ask your health care provider if you should:  Be screened for bone loss.  Take a calcium or vitamin D supplement to lower your risk of fractures.  Be given hormone replacement therapy (HRT) to treat symptoms of menopause. Follow these instructions at home: Lifestyle  Do not use any products that contain nicotine or tobacco, such as cigarettes, e-cigarettes, and chewing tobacco. If you need help quitting, ask your health care provider.  Do not use street drugs.  Do not share needles.  Ask your health care provider for help if you need support or information about quitting drugs. Alcohol use  Do not drink alcohol if: ? Your health care provider tells you not to drink. ? You are pregnant, may be pregnant, or are planning to become pregnant.  If you drink alcohol: ? Limit how much you use to 0-1 drink a day. ? Limit intake if you are breastfeeding.  Be aware of how much alcohol is in your drink. In the [place] one drink equals one 12 oz bottle of beer (355 mL), one 5 oz glass of wine (148 mL), or one 1 oz glass of hard liquor (44 mL). General instructions  Schedule regular health, dental, and eye exams.  Stay current with your vaccines.  Tell your health care provider if: ? You often feel depressed. ? You have ever been abused or do not feel safe at home. Summary  Adopting a healthy lifestyle and getting  preventive care are important in promoting health and wellness.  Follow your health care provider's instructions about healthy diet, exercising, and getting tested or screened for diseases.  Follow your health care provider's instructions on monitoring your cholesterol and blood pressure. This information is not intended to replace advice given to you by your health care provider. Make sure you discuss any questions you have with your health care provider. Document Released: 02/05/2011 Document Revised: 07/16/2018 Document Reviewed: 07/16/2018 Elsevier Patient Education  2020 [employer].

## 2019-03-23 NOTE — Progress Notes (Signed)
Placed referral for ophthalmology

## 2019-03-23 NOTE — Addendum Note (Signed)
Addended by: Gilles Chiquito B on: 03/23/2019 03:37 PM   Modules accepted: Orders

## 2019-03-23 NOTE — Progress Notes (Signed)
She should be taking both, can you advise her please.  Will also plan to get her seen for leg PAD.  Thanks!

## 2019-03-26 ENCOUNTER — Other Ambulatory Visit: Payer: Self-pay | Admitting: *Deleted

## 2019-03-26 NOTE — Telephone Encounter (Signed)
"  She should be taking both, can you advise her please.  Will also plan to get her seen for leg PAD.  Thanks!" per Dr Daryll Drown I called pt - no answer; left message to call the office.

## 2019-03-26 NOTE — Telephone Encounter (Signed)
-----   Message from [name], MD sent at 03/23/2019  3:36 PM EDT -----   ----- Message ----- From: Velora Heckler, RN Sent: 03/20/2019  12:28 PM EDT To: [name], MD  Dr. Vilma Meckel advise: Patient currently not taking lisinopril or pravastatin.  Discussed results of yesterday's [facility] RN visit with patient. She states there was protein in her urine in addition to the PAD of right leg. No available appts with you at this time.I have placed order for Dexa scan and mammoThanks!

## 2019-04-01 DIAGNOSIS — H52223 Regular astigmatism, bilateral: Secondary | ICD-10-CM | POA: Diagnosis not present

## 2019-04-01 DIAGNOSIS — H5203 Hypermetropia, bilateral: Secondary | ICD-10-CM | POA: Diagnosis not present

## 2019-04-01 DIAGNOSIS — H524 Presbyopia: Secondary | ICD-10-CM | POA: Diagnosis not present

## 2019-04-03 ENCOUNTER — Encounter: Payer: Self-pay | Admitting: Dietician

## 2019-04-03 ENCOUNTER — Telehealth: Payer: Self-pay | Admitting: Dietician

## 2019-04-03 NOTE — Telephone Encounter (Signed)
Left two voicemail requesting a return call. Will mail letter today to patient requesting she call to schedule an appointment.

## 2019-04-04 ENCOUNTER — Other Ambulatory Visit: Payer: Self-pay | Admitting: Internal Medicine

## 2019-04-10 ENCOUNTER — Other Ambulatory Visit: Payer: Self-pay | Admitting: Internal Medicine

## 2019-04-14 NOTE — Telephone Encounter (Signed)
Feet swelling. Has been out of her fluid pill.  Feels like she needs it now.   she also wants to know if she can get a new walker. her walker brakes are not working correctly and it is too big for her to put in her car to take places. She uses her cane but feels like she needs a walker especially outside.

## 2019-04-15 ENCOUNTER — Ambulatory Visit (INDEPENDENT_AMBULATORY_CARE_PROVIDER_SITE_OTHER): Payer: PPO | Admitting: Internal Medicine

## 2019-04-15 ENCOUNTER — Telehealth: Payer: Self-pay | Admitting: *Deleted

## 2019-04-15 ENCOUNTER — Other Ambulatory Visit: Payer: Self-pay

## 2019-04-15 DIAGNOSIS — Z79899 Other long term (current) drug therapy: Secondary | ICD-10-CM

## 2019-04-15 DIAGNOSIS — R600 Localized edema: Secondary | ICD-10-CM | POA: Diagnosis not present

## 2019-04-15 DIAGNOSIS — M7989 Other specified soft tissue disorders: Secondary | ICD-10-CM

## 2019-04-15 MED ORDER — PRAVASTATIN SODIUM 20 MG PO TABS: 20.0000 mg | ORAL_TABLET | Freq: Every day | ORAL | 3 refills | Status: DC

## 2019-04-15 NOTE — Progress Notes (Signed)
  [facility] Internal Medicine Residency Telephone Encounter Continuity Care Appointment  HPI:   This telephone encounter was created for [name] on 04/15/2019 for the following purpose/cc of placing order for rolling walker.   Past Medical History:  Past Medical History:  Diagnosis Date  . Anemia    with menses  . Asthma   . Back pain    status post surgery 2002  . Breast cyst    Excesion with FNA, begnin in 2004.   . Degenerative joint disease of spine    Imaging 2005,  Degenerative hypertrophic facet arthritis changes L4-5 and L5-S1..   . History of shingles    Recurrent with post herpetic neuralgia.   [name] Hypertension   . Lymphadenopathy    Of the mediastinum, Right side CXR 2008, not read on 2010 cxr.   . Menopause   . Obesity    BMI 54  . Psychosis ([place])    Secondary to prednisone.  . Shingles   . Stasis dermatitis    W/ LE edema, prviously on lasix now on mazxide.   . SVT (supraventricular tachycardia) [facility]) June 2009   one run while hospitalized  . Tuberculosis    active TB treated in 2002, hx of paraspinal lumbar TB,       ROS:   Denies shortness of breath, chest pain, nausea, vomiting, fever, chills.    Assessment / Plan / Recommendations:   Please see A&P under problem oriented charting for assessment of the patient's acute and chronic medical conditions.   As always, pt is advised that if symptoms worsen or new symptoms arise, they should go to an urgent care facility or to to ER for further evaluation.   Consent and Medical Decision Making:   Patient discussed with Dr. Philipp Ovens  This is a telephone encounter between Erika [name] and Erika [name] on 04/15/2019 for Rolling walker. The visit was conducted with the patient located at home and Erika [name] at [facility]. The patient's identity was confirmed using their DOB and current address. The patient has consented to being evaluated through a telephone encounter and understands the associated  risks (an examination cannot be done and the patient may need to come in for an appointment) / benefits (allows the patient to remain at home, decreasing exposure to coronavirus). I personally spent 8 minutes on medical discussion.

## 2019-04-15 NOTE — Telephone Encounter (Signed)
Community message sent to Jolee Ewing at [facility] that order for rolator placed today. Hubbard Hartshorn, RN, BSN

## 2019-04-15 NOTE — Telephone Encounter (Signed)
Perfect

## 2019-04-15 NOTE — Telephone Encounter (Signed)
Following community messages received from Jolee Ewing at [place]:  Julian Hy, Mariann Barter, [name], Orvis Brill, RN; [place], [place]; [place], [place], [place]        I was just advised she had [employer] when she got the rollator, and she changed to [facility], so it is possible they may pay for a new rollator since it is different insurance.   We would need the order, and office notes showing the need and showing her current weight.   Thanks   Previous Messages  ----- Message -----  From: Mariann Barter  Sent: 04/15/2019 11:27 AM EDT  To: Darlina Guys, Mariann Barter, *  Subject: RE: New smaller rolator              She received her current rollator 07/08/2014, so if she still has the same insurance that she had when she got the first one, her insurance will not cover another one at this time, they will cover a new one 07/09/2019.   She can private pay for the standard rollator and that is approximately $120.00 plus tax. She can go into any one of the retail stores to private pay and pickup the rollator.   Thanks      Telehealth appt made for today at 3:15 to discuss need for new Rolator. Hubbard Hartshorn, RN, BSN

## 2019-04-15 NOTE — Telephone Encounter (Signed)
Does she need to come in for [place] to get weight?

## 2019-04-15 NOTE — Telephone Encounter (Signed)
Patient notified she should be taking both lisinopril and pravastatin. Lisinopril refill sent 03/05/2019; patient has picked this up and will begin taking 1 tab daily.  Pravastatin last refilled in 2018. Will send refill request to PCP for pravastatin. Patient confirmed that she saw eye doctor and was told she has a cataract. Hubbard Hartshorn, RN, BSN

## 2019-04-15 NOTE — Progress Notes (Signed)
Internal Medicine Clinic Attending  Case discussed with Dr. Maricela Bo soon after the resident spoke with the patient.  We reviewed the resident's history and exam and pertinent patient test results.  I agree with the assessment, diagnosis, and plan of care documented in the resident's note.

## 2019-04-15 NOTE — Telephone Encounter (Signed)
Per Butch Penny, patient is requesting new rolator. Spoke with patient, states she would like a smaller rolator that she can put into her car. States she received last one from [facility] now [employer] [facility], approx 5 years ago. Community message sent to [employer] at [employer]. Hubbard Hartshorn, RN, BSN

## 2019-04-15 NOTE — Telephone Encounter (Signed)
Patient has been notified that lasix has been refilled. She will begin taking 1 tab daily PRN LE edema. She will call back if this does not decrease LE edema. Confirmed telehealth appt with Butch Penny for 04/21/2019. New rolator request is being addressed in phone encounter from today, 04/15/2019. Hubbard Hartshorn, RN, BSN

## 2019-04-15 NOTE — Addendum Note (Signed)
Addended by: Velora Heckler on: 04/15/2019 10:52 AM   Modules accepted: Orders

## 2019-04-15 NOTE — Assessment & Plan Note (Signed)
Patient states that she has had worsening worsening leg swelling for the past 3-4 days when she walked on gravel at her granddaughter's birthday party. Denies leg pain, but states that she has swelling in bilateral feet and up calf. It is to the point that she cannot go to car or go outside. Denies shortness of breath or other peripheral edema.   Patient has a rollator that she has been using for the past 5 yrs, but it broke 2 weeks ago. For the past 1 weeks she has not been able to lock the rollator.   Assessment and plan  Per patient's pcp (Dr. Daryll Drown) the patient has chronic leg edema. Patient has been prescribed lasix 20mg  prn.   -lasix 20mg  prn -dme rolling walker -patient may benefit form repeat tte in the future

## 2019-04-15 NOTE — Addendum Note (Signed)
Addended by: Lars Mage on: 04/15/2019 04:40 PM   Modules accepted: Orders

## 2019-04-16 ENCOUNTER — Other Ambulatory Visit: Payer: Self-pay | Admitting: Internal Medicine

## 2019-04-16 DIAGNOSIS — I480 Paroxysmal atrial fibrillation: Secondary | ICD-10-CM | POA: Diagnosis not present

## 2019-04-16 DIAGNOSIS — M170 Bilateral primary osteoarthritis of knee: Secondary | ICD-10-CM | POA: Diagnosis not present

## 2019-04-16 DIAGNOSIS — M479 Spondylosis, unspecified: Secondary | ICD-10-CM | POA: Diagnosis not present

## 2019-04-16 DIAGNOSIS — M7989 Other specified soft tissue disorders: Secondary | ICD-10-CM | POA: Diagnosis not present

## 2019-04-21 ENCOUNTER — Ambulatory Visit (INDEPENDENT_AMBULATORY_CARE_PROVIDER_SITE_OTHER): Payer: PPO | Admitting: Dietician

## 2019-04-21 ENCOUNTER — Other Ambulatory Visit: Payer: Self-pay

## 2019-04-21 DIAGNOSIS — Z6841 Body Mass Index (BMI) 40.0 and over, adult: Secondary | ICD-10-CM | POA: Diagnosis not present

## 2019-04-21 DIAGNOSIS — Z713 Dietary counseling and surveillance: Secondary | ICD-10-CM

## 2019-04-21 NOTE — Progress Notes (Signed)
Intensive Behavioral Therapy for Obesity Session # 1  Start time: [id number]       End time: [id number]- Assessment:BMI 27. Ideal is <30 Patient has set a 5% weight loss goal. Estimated body mass index is 45.11 kg/m as calculated from the following:   Height as of 03/20/19: 5\' 7"  (1.702 m).   Weight as of 03/20/19: 288 lb (130.6 kg). Wt Readings from Last 5 Encounters:  03/20/19 288 lb (130.6 kg)  10/08/18 299 lb (135.6 kg)  10/07/18 (!) 300 lb 8 oz (136.3 kg)  07/04/18 292 lb 14.4 oz (132.9 kg)  05/14/18 287 lb (130.2 kg)  cut junk food, eat more vegetables- lunch and dinner okra- steamed, spinach- boiled, rice eater-  pasta salad- adds vegetables to it.  just started working on cutting sodas. peach tea...25 grams 1 bottle  3 bottle- 18 teaspoons since covid. No scale. Look into OTC  Why- move around better, live better, lower blood pressure. Activity- walks around the house, holds grandbaby; exercise bands, back hurt, feet swelled Goal- 20#  Behavioral Health risk(s):  Factors affecting behavior change goals: ability to buy what she wants, financial resources  2- Advice:  Behavior change advice: discussed the amount of sugar recommended and how much she is getting in her tea.   Health harms and benefits: decreasing sugar and increasing fruits and vegetables to increase nutrient density of her diet for improved nutritional status despite weight status 3- Agreement on Goals: add water to tea, call hta.  For next week:call [employer] to see if they can help you get a scale or send you an OTC catalogue so you can order one, add water or ice to tea to water them downl so you can limit them to 2 a day.   For next 6 months: make behavior changes to decrease weight gradually  In next year:weight loss of 20# to ~ 270#  Long-term: move better, fell better  4- Assist:discussed where to find number for [employer].    5- Arrange follow up: she wants to schedule asme day as another  appointment Debera Lat, RD 04/22/2019 1:45 PM.

## 2019-04-22 NOTE — Patient Instructions (Signed)
Hi Ms. Vasko,  Your goals for the next few week are:   1- call [employer] about your benefits for over the counter items. If you can have them mail you a catalogue and order yourself a scale.   2- decrease the number of bottles of tea to help reduce your sugar intake, each bottle has about 25 grams of sugar in it. That is 6 teaspoons of sugar!!! That enough for the whole day.   Talk to you soon!  Butch Penny [phone]

## 2019-04-23 NOTE — Telephone Encounter (Signed)
[name], Erika Osgood, Erika Brill, RN; [name], Erika Breed, [name]        Yes ma'am. the son picked up the rollator on 04/16/2019 in our [facility] location   Previous Messages  ----- Message -----  From: Velora Heckler, RN  Sent: 04/23/2019  1:40 PM EDT  To: Darlina Guys, Samuel Germany [name]  Subject: FW: New smaller rolator              Hi Levada Dy,   Just making sure you had this order. Thanks!

## 2019-05-05 ENCOUNTER — Ambulatory Visit (INDEPENDENT_AMBULATORY_CARE_PROVIDER_SITE_OTHER): Payer: PPO | Admitting: *Deleted

## 2019-05-05 ENCOUNTER — Other Ambulatory Visit: Payer: Self-pay

## 2019-05-05 DIAGNOSIS — Z23 Encounter for immunization: Secondary | ICD-10-CM

## 2019-05-07 ENCOUNTER — Telehealth: Payer: Self-pay | Admitting: Dietician

## 2019-05-07 NOTE — Telephone Encounter (Signed)
Erika [name] called asking for a list of low calorie foods and anything else to help her. She got tea with 5% sugar to drink instead of 25% and she got the exercise bands in the mail and is trying to use them.  Schedule follow up same day as other appointment(s) here in office.

## 2019-06-14 ENCOUNTER — Other Ambulatory Visit: Payer: Self-pay | Admitting: Hematology

## 2019-06-14 DIAGNOSIS — J4530 Mild persistent asthma, uncomplicated: Secondary | ICD-10-CM

## 2019-06-15 ENCOUNTER — Other Ambulatory Visit: Payer: Self-pay | Admitting: Hematology

## 2019-06-30 ENCOUNTER — Ambulatory Visit
Admission: RE | Admit: 2019-06-30 | Discharge: 2019-06-30 | Disposition: A | Discharge status: Home / Self Care | Payer: PPO | Source: Ambulatory Visit | Attending: Internal Medicine | Admitting: Internal Medicine

## 2019-06-30 ENCOUNTER — Other Ambulatory Visit: Payer: Self-pay

## 2019-06-30 DIAGNOSIS — Z1231 Encounter for screening mammogram for malignant neoplasm of breast: Secondary | ICD-10-CM

## 2019-06-30 DIAGNOSIS — M85852 Other specified disorders of bone density and structure, left thigh: Secondary | ICD-10-CM | POA: Diagnosis not present

## 2019-06-30 DIAGNOSIS — E2839 Other primary ovarian failure: Secondary | ICD-10-CM

## 2019-06-30 DIAGNOSIS — Z780 Asymptomatic menopausal state: Secondary | ICD-10-CM | POA: Diagnosis not present

## 2019-07-10 ENCOUNTER — Telehealth: Payer: Self-pay | Admitting: *Deleted

## 2019-07-10 ENCOUNTER — Emergency Department (HOSPITAL_COMMUNITY): Payer: PPO

## 2019-07-10 ENCOUNTER — Inpatient Hospital Stay (HOSPITAL_COMMUNITY)
Admission: EM | Admit: 2019-07-10 | Discharge: 2019-07-28 | DRG: 840 | Disposition: A | Discharge status: Skilled Nursing Facility | Payer: PPO | Attending: Internal Medicine | Admitting: Internal Medicine

## 2019-07-10 ENCOUNTER — Other Ambulatory Visit: Payer: Self-pay

## 2019-07-10 DIAGNOSIS — M25552 Pain in left hip: Secondary | ICD-10-CM | POA: Diagnosis not present

## 2019-07-10 DIAGNOSIS — M461 Sacroiliitis, not elsewhere classified: Secondary | ICD-10-CM

## 2019-07-10 DIAGNOSIS — E790 Hyperuricemia without signs of inflammatory arthritis and tophaceous disease: Secondary | ICD-10-CM | POA: Diagnosis not present

## 2019-07-10 DIAGNOSIS — I10 Essential (primary) hypertension: Secondary | ICD-10-CM | POA: Diagnosis not present

## 2019-07-10 DIAGNOSIS — Z9049 Acquired absence of other specified parts of digestive tract: Secondary | ICD-10-CM | POA: Diagnosis not present

## 2019-07-10 DIAGNOSIS — G9389 Other specified disorders of brain: Secondary | ICD-10-CM | POA: Diagnosis not present

## 2019-07-10 DIAGNOSIS — D649 Anemia, unspecified: Secondary | ICD-10-CM | POA: Diagnosis not present

## 2019-07-10 DIAGNOSIS — Z888 Allergy status to other drugs, medicaments and biological substances status: Secondary | ICD-10-CM | POA: Diagnosis not present

## 2019-07-10 DIAGNOSIS — D696 Thrombocytopenia, unspecified: Secondary | ICD-10-CM

## 2019-07-10 DIAGNOSIS — Z20828 Contact with and (suspected) exposure to other viral communicable diseases: Secondary | ICD-10-CM | POA: Diagnosis not present

## 2019-07-10 DIAGNOSIS — Z832 Family history of diseases of the blood and blood-forming organs and certain disorders involving the immune mechanism: Secondary | ICD-10-CM | POA: Diagnosis not present

## 2019-07-10 DIAGNOSIS — Z209 Contact with and (suspected) exposure to unspecified communicable disease: Secondary | ICD-10-CM | POA: Diagnosis not present

## 2019-07-10 DIAGNOSIS — M6281 Muscle weakness (generalized): Secondary | ICD-10-CM | POA: Diagnosis not present

## 2019-07-10 DIAGNOSIS — G92 Toxic encephalopathy: Secondary | ICD-10-CM | POA: Diagnosis not present

## 2019-07-10 DIAGNOSIS — Z833 Family history of diabetes mellitus: Secondary | ICD-10-CM

## 2019-07-10 DIAGNOSIS — N179 Acute kidney failure, unspecified: Secondary | ICD-10-CM | POA: Diagnosis present

## 2019-07-10 DIAGNOSIS — R451 Restlessness and agitation: Secondary | ICD-10-CM | POA: Diagnosis not present

## 2019-07-10 DIAGNOSIS — K5903 Drug induced constipation: Secondary | ICD-10-CM

## 2019-07-10 DIAGNOSIS — J4540 Moderate persistent asthma, uncomplicated: Secondary | ICD-10-CM | POA: Diagnosis present

## 2019-07-10 DIAGNOSIS — Z823 Family history of stroke: Secondary | ICD-10-CM

## 2019-07-10 DIAGNOSIS — Z7951 Long term (current) use of inhaled steroids: Secondary | ICD-10-CM

## 2019-07-10 DIAGNOSIS — R52 Pain, unspecified: Secondary | ICD-10-CM | POA: Diagnosis not present

## 2019-07-10 DIAGNOSIS — G4709 Other insomnia: Secondary | ICD-10-CM | POA: Diagnosis not present

## 2019-07-10 DIAGNOSIS — R2681 Unsteadiness on feet: Secondary | ICD-10-CM | POA: Diagnosis not present

## 2019-07-10 DIAGNOSIS — J189 Pneumonia, unspecified organism: Secondary | ICD-10-CM | POA: Diagnosis not present

## 2019-07-10 DIAGNOSIS — G8929 Other chronic pain: Secondary | ICD-10-CM | POA: Diagnosis not present

## 2019-07-10 DIAGNOSIS — M48061 Spinal stenosis, lumbar region without neurogenic claudication: Secondary | ICD-10-CM | POA: Diagnosis not present

## 2019-07-10 DIAGNOSIS — R278 Other lack of coordination: Secondary | ICD-10-CM | POA: Diagnosis not present

## 2019-07-10 DIAGNOSIS — Z8249 Family history of ischemic heart disease and other diseases of the circulatory system: Secondary | ICD-10-CM

## 2019-07-10 DIAGNOSIS — Z6841 Body Mass Index (BMI) 40.0 and over, adult: Secondary | ICD-10-CM

## 2019-07-10 DIAGNOSIS — D469 Myelodysplastic syndrome, unspecified: Secondary | ICD-10-CM

## 2019-07-10 DIAGNOSIS — C946 Myelodysplastic disease, not classified: Secondary | ICD-10-CM | POA: Diagnosis not present

## 2019-07-10 DIAGNOSIS — M5137 Other intervertebral disc degeneration, lumbosacral region: Secondary | ICD-10-CM | POA: Diagnosis present

## 2019-07-10 DIAGNOSIS — G4489 Other headache syndrome: Secondary | ICD-10-CM | POA: Diagnosis not present

## 2019-07-10 DIAGNOSIS — K5900 Constipation, unspecified: Secondary | ICD-10-CM | POA: Diagnosis not present

## 2019-07-10 DIAGNOSIS — D471 Chronic myeloproliferative disease: Secondary | ICD-10-CM | POA: Diagnosis not present

## 2019-07-10 DIAGNOSIS — Z8611 Personal history of tuberculosis: Secondary | ICD-10-CM | POA: Diagnosis not present

## 2019-07-10 DIAGNOSIS — M5126 Other intervertebral disc displacement, lumbar region: Secondary | ICD-10-CM | POA: Diagnosis not present

## 2019-07-10 DIAGNOSIS — Z8619 Personal history of other infectious and parasitic diseases: Secondary | ICD-10-CM

## 2019-07-10 DIAGNOSIS — R911 Solitary pulmonary nodule: Secondary | ICD-10-CM

## 2019-07-10 DIAGNOSIS — Z7982 Long term (current) use of aspirin: Secondary | ICD-10-CM | POA: Diagnosis not present

## 2019-07-10 DIAGNOSIS — R918 Other nonspecific abnormal finding of lung field: Secondary | ICD-10-CM | POA: Diagnosis not present

## 2019-07-10 DIAGNOSIS — R41841 Cognitive communication deficit: Secondary | ICD-10-CM | POA: Diagnosis not present

## 2019-07-10 DIAGNOSIS — I119 Hypertensive heart disease without heart failure: Secondary | ICD-10-CM | POA: Diagnosis not present

## 2019-07-10 DIAGNOSIS — M2550 Pain in unspecified joint: Secondary | ICD-10-CM | POA: Diagnosis not present

## 2019-07-10 DIAGNOSIS — N320 Bladder-neck obstruction: Secondary | ICD-10-CM | POA: Diagnosis not present

## 2019-07-10 DIAGNOSIS — I480 Paroxysmal atrial fibrillation: Secondary | ICD-10-CM | POA: Diagnosis not present

## 2019-07-10 DIAGNOSIS — Z803 Family history of malignant neoplasm of breast: Secondary | ICD-10-CM

## 2019-07-10 DIAGNOSIS — M25452 Effusion, left hip: Secondary | ICD-10-CM | POA: Diagnosis not present

## 2019-07-10 DIAGNOSIS — E883 Tumor lysis syndrome: Secondary | ICD-10-CM | POA: Diagnosis not present

## 2019-07-10 DIAGNOSIS — R531 Weakness: Secondary | ICD-10-CM | POA: Diagnosis not present

## 2019-07-10 DIAGNOSIS — D259 Leiomyoma of uterus, unspecified: Secondary | ICD-10-CM | POA: Diagnosis not present

## 2019-07-10 DIAGNOSIS — Z79899 Other long term (current) drug therapy: Secondary | ICD-10-CM

## 2019-07-10 DIAGNOSIS — Z7401 Bed confinement status: Secondary | ICD-10-CM | POA: Diagnosis not present

## 2019-07-10 DIAGNOSIS — R9389 Abnormal findings on diagnostic imaging of other specified body structures: Secondary | ICD-10-CM

## 2019-07-10 DIAGNOSIS — R0789 Other chest pain: Secondary | ICD-10-CM | POA: Diagnosis not present

## 2019-07-10 DIAGNOSIS — D72829 Elevated white blood cell count, unspecified: Secondary | ICD-10-CM | POA: Diagnosis not present

## 2019-07-10 DIAGNOSIS — R079 Chest pain, unspecified: Secondary | ICD-10-CM | POA: Diagnosis not present

## 2019-07-10 DIAGNOSIS — R062 Wheezing: Secondary | ICD-10-CM

## 2019-07-10 DIAGNOSIS — M5489 Other dorsalgia: Secondary | ICD-10-CM | POA: Diagnosis not present

## 2019-07-10 DIAGNOSIS — J9811 Atelectasis: Secondary | ICD-10-CM | POA: Diagnosis not present

## 2019-07-10 DIAGNOSIS — R2689 Other abnormalities of gait and mobility: Secondary | ICD-10-CM | POA: Diagnosis not present

## 2019-07-10 DIAGNOSIS — Z79891 Long term (current) use of opiate analgesic: Secondary | ICD-10-CM

## 2019-07-10 DIAGNOSIS — R700 Elevated erythrocyte sedimentation rate: Secondary | ICD-10-CM | POA: Diagnosis present

## 2019-07-10 LAB — CBC WITH DIFFERENTIAL/PLATELET
Abs Immature Granulocytes: 8.59 10*3/uL — ABNORMAL HIGH (ref 0.00–0.07)
Band Neutrophils: 21 %
Basophils Absolute: 0 10*3/uL (ref 0.0–0.1)
Basophils Relative: 0 %
Blasts: 0 %
Eosinophils Absolute: 1.1 10*3/uL — ABNORMAL HIGH (ref 0.0–0.5)
Eosinophils Relative: 1 %
HCT: 30.9 % — ABNORMAL LOW (ref 36.0–46.0)
Hemoglobin: 10 g/dL — ABNORMAL LOW (ref 12.0–15.0)
Lymphocytes Relative: 2 %
Lymphs Abs: 2.1 10*3/uL (ref 0.7–4.0)
MCH: 31.9 pg (ref 26.0–34.0)
MCHC: 32.4 g/dL (ref 30.0–36.0)
MCV: 98.7 fL (ref 80.0–100.0)
Metamyelocytes Relative: 1 %
Monocytes Absolute: 2.1 10*3/uL — ABNORMAL HIGH (ref 0.1–1.0)
Monocytes Relative: 2 %
Myelocytes: 6 %
Neutro Abs: 93.5 10*3/uL — ABNORMAL HIGH (ref 1.7–7.7)
Neutrophils Relative %: 66 %
Other: 0 %
Platelets: 145 10*3/uL — ABNORMAL LOW (ref 150–400)
Promyelocytes Relative: 1 %
RBC: 3.13 MIL/uL — ABNORMAL LOW (ref 3.87–5.11)
RDW: 15.4 % (ref 11.5–15.5)
WBC Morphology: INCREASED
WBC: 107.4 10*3/uL (ref 4.0–10.5)
nRBC: 0 % (ref 0.0–0.2)
nRBC: 0 /100 WBC

## 2019-07-10 LAB — PROTIME-INR
INR: 1.3 — ABNORMAL HIGH (ref 0.8–1.2)
Prothrombin Time: 15.7 seconds — ABNORMAL HIGH (ref 11.4–15.2)

## 2019-07-10 LAB — COMPREHENSIVE METABOLIC PANEL
ALT: 15 U/L (ref 0–44)
AST: 19 U/L (ref 15–41)
Albumin: 3.7 g/dL (ref 3.5–5.0)
Alkaline Phosphatase: 331 U/L — ABNORMAL HIGH (ref 38–126)
Anion gap: 16 — ABNORMAL HIGH (ref 5–15)
BUN: 26 mg/dL — ABNORMAL HIGH (ref 8–23)
CO2: 23 mmol/L (ref 22–32)
Calcium: 9.4 mg/dL (ref 8.9–10.3)
Chloride: 96 mmol/L — ABNORMAL LOW (ref 98–111)
Creatinine, Ser: 1.49 mg/dL — ABNORMAL HIGH (ref 0.44–1.00)
GFR calc Af Amer: 39 mL/min — ABNORMAL LOW (ref >60)
GFR calc non Af Amer: 34 mL/min — ABNORMAL LOW (ref >60)
Glucose, Bld: 93 mg/dL (ref 70–99)
Potassium: 4.4 mmol/L (ref 3.5–5.1)
Sodium: 135 mmol/L (ref 135–145)
Total Bilirubin: 0.4 mg/dL (ref 0.3–1.2)
Total Protein: 8.3 g/dL — ABNORMAL HIGH (ref 6.5–8.1)

## 2019-07-10 LAB — SEDIMENTATION RATE: Sed Rate: 70 mm/hr — ABNORMAL HIGH (ref 0–22)

## 2019-07-10 LAB — C-REACTIVE PROTEIN: CRP: 1.9 mg/dL — ABNORMAL HIGH (ref <1.0)

## 2019-07-10 LAB — LACTIC ACID, PLASMA: Lactic Acid, Venous: 1.2 mmol/L (ref 0.5–1.9)

## 2019-07-10 LAB — APTT: aPTT: 48 seconds — ABNORMAL HIGH (ref 24–36)

## 2019-07-10 LAB — URIC ACID: Uric Acid, Serum: 14.1 mg/dL — ABNORMAL HIGH (ref 2.5–7.1)

## 2019-07-10 MED ORDER — ALLOPURINOL 300 MG PO TABS
150.0000 mg | ORAL_TABLET | Freq: Two times a day (BID) | ORAL | Status: DC
  Administered 2019-07-10 – 2019-07-28 (×34): 150 mg via ORAL
  Filled 2019-07-10 (×11): qty 1
  Filled 2019-07-10: qty 0.5
  Filled 2019-07-10 (×7): qty 1
  Filled 2019-07-10: qty 0.5
  Filled 2019-07-10 (×16): qty 1

## 2019-07-10 MED ORDER — METHOCARBAMOL 500 MG PO TABS
500.0000 mg | ORAL_TABLET | Freq: Once | ORAL | Status: AC
  Administered 2019-07-10: 500 mg via ORAL
  Filled 2019-07-10: qty 1

## 2019-07-10 MED ORDER — VANCOMYCIN HCL 10 G IV SOLR
2000.0000 mg | Freq: Once | INTRAVENOUS | Status: AC
  Administered 2019-07-10: 2000 mg via INTRAVENOUS
  Filled 2019-07-10: qty 2000

## 2019-07-10 MED ORDER — SODIUM CHLORIDE 0.9 % IV BOLUS
1000.0000 mL | Freq: Once | INTRAVENOUS | Status: AC
  Administered 2019-07-10: 1000 mL via INTRAVENOUS

## 2019-07-10 MED ORDER — SODIUM CHLORIDE 0.9 % IV SOLN
2.0000 g | Freq: Three times a day (TID) | INTRAVENOUS | Status: DC
  Administered 2019-07-10 – 2019-07-11 (×2): 2 g via INTRAVENOUS
  Filled 2019-07-10 (×2): qty 2

## 2019-07-10 MED ORDER — HYDROCODONE-ACETAMINOPHEN 5-325 MG PO TABS
1.0000 | ORAL_TABLET | Freq: Once | ORAL | Status: AC
  Administered 2019-07-10: 1 via ORAL
  Filled 2019-07-10: qty 1

## 2019-07-10 NOTE — Telephone Encounter (Signed)
Agree with plan to come in and be seen.

## 2019-07-10 NOTE — ED Notes (Signed)
Date and time results received: 07/10/19 6:46 PM  (use smartphrase ".now" to insert current time)  Test: WBC Critical Value: 107.4  Name of Provider Notified: [facility]  Orders Received? Or Actions Taken?: Orders Received - See Orders for details

## 2019-07-10 NOTE — ED Triage Notes (Signed)
Pt arrives [facility] from home with complaints of left sided hip pain beginning yesterday. Pt denies injury or trauma. Pt reports the pain goes down her left leg. Pt reports generalized weakness for 2 days. Pt denies cough or fever. Wheezing noted in triage. Pt reports hx of asthma. Used inhaler this am.

## 2019-07-10 NOTE — ED Notes (Signed)
Patient transported to X-ray 

## 2019-07-10 NOTE — ED Provider Notes (Signed)
[facility] DEPT Provider Note   CSN: [id number] Arrival date & time: 07/10/19  1621     History   Chief Complaint Chief Complaint  Patient presents with   Hip Pain   Weakness    HPI Erika [name] is a 78 y.o. female.     The history is provided by the patient.  Hip Pain This is a recurrent problem. Episode onset: 3-4 days ago. The problem occurs constantly. The problem has been gradually worsening. Associated symptoms comments: Pain in the left hip and buttocks.  No radiation of the pain but more pain when trying to walk or move the leg.  Sometimes twisting also makes it worse.  Has had it before for years ago.  No recent falls.  No fever, cough or SOB.  No headache.  Pt has noticed occassional rash on her arms and legs which comes and goes.  She is still taking eliquis and has not missed any doses.  No recent medication changes.  Taking BC powder and aleve at home which helped the first few days but now much more painful.. The symptoms are aggravated by bending, twisting, standing and walking. The symptoms are relieved by rest. The treatment provided no relief.  Weakness   Past Medical History:  Diagnosis Date   Anemia    with menses   Asthma    Back pain    status post surgery 2002   Breast cyst    Excesion with FNA, begnin in 2004.    Degenerative joint disease of spine    Imaging 2005,  Degenerative hypertrophic facet arthritis changes L4-5 and L5-S1.[name]    History of shingles    Recurrent with post herpetic neuralgia.    Hypertension    Lymphadenopathy    Of the mediastinum, Right side CXR 2008, not read on 2010 cxr.    Menopause    Obesity    BMI 54   Psychosis ([place])    Secondary to prednisone.   Shingles    Stasis dermatitis    W/ LE edema, prviously on lasix now on mazxide.    SVT (supraventricular tachycardia) ([place]) June 2009   one run while hospitalized   Tuberculosis    active TB treated in 2002, hx of  paraspinal lumbar TB,     Patient Active Problem List   Diagnosis Date Noted   AF (paroxysmal atrial fibrillation) ([place]) 06/02/2017   Leukocytosis 05/29/2017   Anemia 05/29/2017   Leg swelling 02/23/2016   Hand cramps 11/22/2015   GERD (gastroesophageal reflux disease) 02/09/2013   OA (osteoarthritis) of knee 01/29/2012   OA (osteoarthritis of spine) 10/18/2011   Healthcare maintenance 04/25/2011   Hypokalemia 02/26/2008   Moderate persistent asthma 02/10/2008   Hyperlipidemia 08/04/2007   H/O Herpes zoster 08/29/2006   Obesity, Class III, BMI 40-49.9 (morbid obesity) ([place]) 08/29/2006   History of breast cyst 08/29/2006   Essential hypertension 08/27/2006   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  Home Medications    Prior to Admission medications   Medication Sig Start Date End Date Taking? Authorizing Provider  ADVAIR DISKUS 500-50 MCG/DOSE AEPB INHALE 1 PUFF BY MOUTH TWICE DAILY 03/18/19   [name], MD  albuterol (PROVENTIL HFA;VENTOLIN HFA) 108 (90 Base) MCG/ACT inhaler INHALE 1 TO 2 PUFFS BY MOUTH EVERY 6 HOURS AS NEEDED 10/30/18   Brunetta Genera, MD  amLODipine (NORVASC) 10 MG tablet TAKE 1 TABLET BY MOUTH EVERY DAY 05/05/18   [name], MD  apixaban (ELIQUIS) 5 MG TABS tablet Take 1 tablet (5 mg total) by mouth 2 (two) times daily. 12/10/18   [name], MD  aspirin EC 81 MG tablet Take 81 mg by mouth daily.    [provider]    diphenhydrAMINE (BENADRYL) 25 MG tablet Take 25 mg by mouth as needed. Once a day prn    [provider]  fexofenadine (ALLEGRA ALLERGY) 180 MG tablet Take 1 tablet (180 mg total) by mouth daily. Patient not taking: Reported on 03/20/2019 10/31/17   Lorella Nimrod, MD  fluticasone [facility]) 50 MCG/ACT nasal spray SHAKE LIQUID AND USE 1 SPRAY IN [facility] NOSTRIL DAILY 04/16/19   [name], MD  furosemide (LASIX) 20 MG tablet TAKE 1 TABLET(20 MG) BY MOUTH DAILY AS NEEDED FOR EDEMA 04/14/19   Gilles Chiquito B, MD  lisinopril (ZESTRIL) 5 MG tablet TAKE 1 TABLET BY MOUTH EVERY DAY Patient not taking: Reported on 03/20/2019 03/05/19   [name], MD  metoprolol tartrate (LOPRESSOR) 25 MG tablet TAKE 1/2 TABLET(12.5 MG) BY MOUTH TWICE DAILY 04/06/19   [name], MD  omeprazole (PRILOSEC) 20 MG capsule TAKE 1 CAPSULE BY MOUTH EVERY DAY 09/29/18   [name], MD  potassium chloride SA (K-DUR,KLOR-CON) 20 MEQ tablet Take 0.5 tablets (10 mEq total) by mouth daily. 04/28/18   [name], MD  pravastatin (PRAVACHOL) 20 MG tablet Take 1 tablet (20 mg total) by mouth daily. 04/15/19   [name], MD  sodium chloride (OCEAN) 0.65 % SOLN nasal spray Place 1 spray into both nostrils as needed for congestion. Patient not taking: Reported on 03/20/2019 10/31/17   Lorella Nimrod, MD  traMADol (ULTRAM) 50 MG tablet TAKE 1 TABLET(50 MG) BY MOUTH EVERY 12 HOURS AS NEEDED 12/19/18   [name], MD    Family History Family History  Problem Relation Age of Onset   Stroke Mother        at young age   Heart disease Mother    Heart attack Father    Heart disease Father    Cancer Sister        Unknown   Stroke Brother    Stroke Brother    Breast cancer Other    Sickle cell anemia Brother    Diabetes Brother    Stroke Brother     Social History Social History   Tobacco Use   Smoking status: Never Smoker   Smokeless tobacco: Never Used  Substance Use Topics   Alcohol  use: No    Alcohol/week: 0.0 standard drinks   Drug use: No     Allergies   Prednisone   Review of Systems Review of Systems  Neurological: Positive for weakness.  All other systems reviewed and are negative.    Physical Exam Updated Vital Signs BP 132/64 (BP Location: Right Arm)    Pulse 94    Temp 97.6 F (36.4 C) (Oral)    Resp 20  Wt 134.7 kg    SpO2 95%    BMI 46.52 kg/m   Physical Exam Vitals signs and nursing note reviewed.  Constitutional:      General: She is not in acute distress.    Appearance: Normal appearance. She is well-developed. She is obese.  HENT:     Head: Normocephalic and atraumatic.  Eyes:     Pupils: Pupils are equal, round, and reactive to light.  Cardiovascular:     Rate and Rhythm: Normal rate and regular rhythm.     Pulses: Normal pulses.     Heart sounds: Normal heart sounds. No murmur. No friction rub.  Pulmonary:     Effort: Pulmonary effort is normal.     Breath sounds: Normal breath sounds. No wheezing or rales.  Abdominal:     General: Bowel sounds are normal. There is no distension.     Palpations: Abdomen is soft.     Tenderness: There is no abdominal tenderness. There is no guarding or rebound.  Musculoskeletal: Normal range of motion.        General: Tenderness present.       Legs:     Comments: No edema  Skin:    General: Skin is warm and dry.     Capillary Refill: Capillary refill takes less than 2 seconds.     Findings: Rash present.     Comments: Petechial rash present in the calves and feet bilaterally  Neurological:     General: No focal deficit present.     Mental Status: She is alert and oriented to person, place, and time. Mental status is at baseline.     Cranial Nerves: No cranial nerve deficit.  Psychiatric:        Mood and Affect: Mood normal.        Behavior: Behavior normal.        Thought Content: Thought content normal.      ED Treatments / Results  Labs (all labs ordered are listed, but only  abnormal results are displayed) Labs Reviewed  CBC WITH DIFFERENTIAL/PLATELET - Abnormal; Notable for the following components:      Result Value   WBC 107.4 (*)    RBC 3.13 (*)    Hemoglobin 10.0 (*)    HCT 30.9 (*)    Platelets 145 (*)    Neutro Abs 93.5 (*)    Monocytes Absolute 2.1 (*)    Eosinophils Absolute 1.1 (*)    Abs Immature Granulocytes 8.59 (*)    All other components within normal limits  COMPREHENSIVE METABOLIC PANEL - Abnormal; Notable for the following components:   Chloride 96 (*)    BUN 26 (*)    Creatinine, Ser 1.49 (*)    Total Protein 8.3 (*)    Alkaline Phosphatase 331 (*)    GFR calc non Af Amer 34 (*)    GFR calc Af Amer 39 (*)    Anion gap 16 (*)    All other components within normal limits  APTT - Abnormal; Notable for the following components:   aPTT 48 (*)    All other components within normal limits  PROTIME-INR - Abnormal; Notable for the following components:   Prothrombin Time 15.7 (*)    INR 1.3 (*)    All other components within normal limits  SEDIMENTATION RATE - Abnormal; Notable for the following components:   Sed Rate 70 (*)    All other components within normal limits  C-REACTIVE PROTEIN - Abnormal; Notable for the  following components:   CRP 1.9 (*)    All other components within normal limits  URIC ACID - Abnormal; Notable for the following components:   Uric Acid, Serum 14.1 (*)    All other components within normal limits  SARS CORONAVIRUS 2 (TAT 6-24 HRS)  LACTIC ACID, PLASMA    EKG None  Radiology Ct Pelvis Wo Contrast  Result Date: 07/10/2019 CLINICAL DATA:  Hip pain, chronic possible lytic lesion left sacral ala. EXAM: CT PELVIS WITHOUT CONTRAST TECHNIQUE: Multidetector CT imaging of the pelvis was performed following the standard protocol without intravenous contrast. COMPARISON:  Plain film study of the same date., bone biopsy images of 06/24/2017. FINDINGS: Urinary Tract:  Choose 1 Bowel: No signs of acute bowel  process within visualized portions of the bowel. Vascular/Lymphatic: Not well evaluated given lack of intravenous contrast. Signs of calcific atherosclerotic change throughout the distal abdominal aorta and the iliac vasculature. Reproductive:  Calcified fibroid in the uterine fundus. Other:  No signs of ascites. Musculoskeletal: Signs of bony sclerosis about the left sacroiliac joint, now associated with more bony erosion and indistinct margins than on the previous exam. Widening of the joint since the prior exam. Subtle stranding along the anterior margin of the joint becoming more pronounced inferiorly. The hips are located bilaterally. IMPRESSION: 1. Signs of bony sclerosis about the left sacroiliac joint, now associated with more bony erosion and indistinct margins than on the previous exam. Findings are concerning for septic arthritis versus active sacroiliitis. No signs of fracture. Small cortical 2. Signs of calcific atherosclerotic change throughout the distal abdominal aorta and iliac vasculature. 3. Calcified fibroid in the uterine fundus. 4. Aortic atherosclerosis. Aortic Atherosclerosis (ICD10-I70.0). Electronically Signed   By: Zetta Bills M.D.   On: 07/10/2019 19:02   Dg Hip Unilat W Or Wo Pelvis 2-3 Views Left  Result Date: 07/10/2019 CLINICAL DATA:  Left-sided hip pain EXAM: DG HIP (WITH OR WITHOUT PELVIS) 2-3V LEFT COMPARISON:  CT 05/29/2017 FINDINGS: No fracture or malalignment. Mild joint space narrowing. Pubic symphysis and rami appear intact. Questionable lytic change involving the left sacral ala. IMPRESSION: 1. No acute osseous abnormality of left hip. Mild degenerative change 2. Questionable lytic process involving the left sacral ala. CT could be obtained for further evaluation. Electronically Signed   By: Donavan Foil M.D.   On: 07/10/2019 17:34    Procedures Procedures (including critical care time)  Medications Ordered in ED Medications  HYDROcodone-acetaminophen  (NORCO/VICODIN) 5-325 MG per tablet 1 tablet (has no administration in time range)  methocarbamol (ROBAXIN) tablet 500 mg (has no administration in time range)     Initial Impression / Assessment and Plan / ED Course  I have reviewed the triage vital signs and the nursing notes.  Pertinent labs & imaging results that were available during my care of the patient were reviewed by me and considered in my medical decision making (see chart for details).       78 year old female with multiple medical problems including hypertension, SVT, back pain, myeloproliferative disease, anemia and morbid obesity who is presenting today with left hip and buttocks pain that is been worsening over the last 3 to 4 days.  There is been no trauma and patient has denied any infectious symptoms.  She denies any cough, shortness of breath or chest pain.  She has been taking BC powder and Advil at home which the first few days helped but now the pain has gotten to a point where she is unable to  get out of bed or walk.  The pain is definitely exacerbated by movement and weightbearing.  While in bed it is only 5 out of 10.  Patient is given pain control.  X-rays done.  However also on exam patient has notable petechia in bilateral lower extremities.  This could be due to the taking her Eliquis as well as BC powders and Advil however will do lab work to ensure there is no other acute findings such as thrombocytopenia or anything that could be related to the Myloproliferative disease.  Plain film shows no acute osseous abnormality of the left hip but does have some degenerative changes.  Also questionable lytic process involving the left sacral alla.  CT was ordered to further evaluate.   7:28 PM Patient CBC shows a white count today of 107 which is significantly increased since she last had a CBC in March.  It was 55 at that time.  She has increased bands and a large predominance of absolute neutrophils.  Patient's platelet  count is stable at 145 and hemoglobin is stable at 10.  CMP shows mild AKI with a creatinine of 1.49 from her baseline of 1.8 and anion gap of 16.  INR is 1.3 pro time is 15 and PTT is 48.  CT of the pelvis without contrast shows signs of bony sclerosis around the left sacroiliac joint now associated with more bony erosion.  They states findings are concerning for septic arthritis versus active sacroiliitis without fracture is identified.  Patient does not have classic exam findings for septic arthritis.  I was able to lift and rotate her leg and all directions without significant pain.  After 1 hydrocodone her pain has improved.  We will send a ESR, CRP and lactate.  We will also talk with hematology about patient's lab results.  She remains afebrile and well-appearing.  Vital signs are within normal limits.  9:20 PM Sed rate is elevated at 70 which seems to be more chronic, lactic acid within normal limits and CRP improved at 1.9.  Uric acid is 14 today which is elevated and with elevated renal function spoke with Dr. Irene Limbo who recommended starting allopurinol and IV fluids to ensure renal function improves due to concern for possible tumor lysis syndrome.  Discussed the findings with the patient and her son.  They are comfortable with admission and hospitalist was notified.  CRITICAL CARE Performed by: Kuulei Kleier Total critical care time: 30 minutes Critical care time was exclusive of separately billable procedures and treating other patients. Critical care was necessary to treat or prevent imminent or life-threatening deterioration. Critical care was time spent personally by me on the following activities: development of treatment plan with patient and/or surrogate as well as nursing, discussions with consultants, evaluation of patient's response to treatment, examination of patient, obtaining history from patient or surrogate, ordering and performing treatments and interventions, ordering and  review of laboratory studies, ordering and review of radiographic studies, pulse oximetry and re-evaluation of patient's condition.   Final Clinical Impressions(s) / ED Diagnoses   Final diagnoses:  Sacroiliitis ([place])  Tumor lysis syndrome  AKI (acute kidney injury) [facility])    ED Discharge Orders    None       Blanchie Dessert, MD 07/10/19 2125

## 2019-07-10 NOTE — Telephone Encounter (Signed)
Pt calls and states she started having lower back pain 12/3 am and it is bad, states she wanted to call 911 but it is too expensive. She is ask to have family bring her to urg care at [facility], she ask if clinic could send a wheelchair out to her to use, informed her she could talk to dr at next appt but insurance would need an office visit and documentation to pay. She states she will see if her sone can help her to the car and bring her. Encouraged her to call 911 if needed and call for f/u appt. She is agreeable

## 2019-07-10 NOTE — ED Notes (Signed)
Erika [name] (son) [id number]

## 2019-07-10 NOTE — H&P (Signed)
History and Physical    Erika [name] [id number] DOB: Feb 27, 1941 DOA: 07/10/2019  PCP: [name], MD  Patient coming from: home   Chief Complaint: back pain  HPI: Erika [name] is a 78 y.o. 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  Patient cites a history of low back pain and says this pain is similar. It began about 2 days ago and is described as moderate to severe. It is on the left side. Hurts to move, particularly to get up from standing. Does not radiate. No weakness or numbness or saddle anesthesia or new gait problems. No changes to bowel/bladder dysfunction. Denies IV drug use. No recent trauma. No fevers or chills, no nausea, no vomiting. Says current wheeze is more or less at her baseline. Denies cough or covid contacts. Denies worsening dyspnea on exertion. Denies recent med changes.   Last saw hematology for her myeloproliferative disorder in march of this year, plan was f/u in 3 months with labs.  ED Course: labs, imaging, oncology consult  Review of Systems: As per HPI otherwise 10 point review of systems negative.    Past Medical History:  Diagnosis Date   Anemia    with menses   Asthma    Back pain    status post surgery 2002   Breast cyst    Excesion with FNA, begnin in 2004.    Degenerative joint disease of spine    Imaging 2005,  Degenerative hypertrophic facet arthritis changes L4-5 and L5-S1.[name]    History of shingles    Recurrent with post herpetic neuralgia.    Hypertension    Lymphadenopathy    Of the mediastinum, Right side CXR 2008, not read on 2010 cxr.    Menopause    Obesity    BMI 54   Psychosis ([place])    Secondary to prednisone.   Shingles    Stasis dermatitis    W/ LE edema, prviously on lasix now on mazxide.    SVT (supraventricular tachycardia) ([place]) June  2009   one run while hospitalized   Tuberculosis    active TB treated in 2002, hx of paraspinal lumbar TB,     Past Surgical History:  Procedure Laterality Date   BREAST CYST ASPIRATION Left    Breast cyst biopsy     CHOLECYSTECTOMY  02/02/2012   Procedure: LAPAROSCOPIC CHOLECYSTECTOMY;  Surgeon: Zenovia Jarred, MD;  Location: [facility];  Service: General;  Laterality: N/A;   Hemilaminectomy of L4, L5 and S1 decompression of tumor in epidural space  2000     reports that she has never smoked. She has never used smokeless tobacco. She reports that she does not drink alcohol or use drugs.  Allergies  Allergen Reactions   Prednisone Other (See Comments)    REACTION: Psychosis, talking out of head, insomnia    Family History  Problem Relation Age of Onset   Stroke Mother        at young age   Heart disease Mother    Heart attack Father    Heart disease Father    Cancer Sister        Unknown   Stroke Brother    Stroke Brother    Breast cancer Other    Sickle cell anemia Brother    Diabetes Brother    Stroke Brother     Prior  to Admission medications   Medication Sig Start Date End Date Taking? Authorizing Provider  ADVAIR DISKUS 500-50 MCG/DOSE AEPB INHALE 1 PUFF BY MOUTH TWICE DAILY Patient taking differently: Inhale 1 puff into the lungs 2 (two) times daily.  03/18/19  Yes [name], MD  albuterol (PROVENTIL HFA;VENTOLIN HFA) 108 (90 Base) MCG/ACT inhaler INHALE 1 TO 2 PUFFS BY MOUTH EVERY 6 HOURS AS NEEDED Patient taking differently: Inhale 1-2 puffs into the lungs every 6 (six) hours as needed for wheezing or shortness of breath.  10/30/18  Yes Brunetta Genera, MD  amLODipine (NORVASC) 10 MG tablet TAKE 1 TABLET BY MOUTH EVERY DAY Patient taking differently: Take 10 mg by mouth daily.  05/05/18  Yes [name], MD  apixaban (ELIQUIS) 5 MG TABS tablet Take 1 tablet (5 mg total) by mouth 2 (two) times daily. 12/10/18  Yes [name], MD    aspirin EC 81 MG tablet Take 81 mg by mouth daily.   Yes [provider]  diphenhydrAMINE (BENADRYL) 25 MG tablet Take 25 mg by mouth as needed. Once a day prn   Yes [provider]  fluticasone (FLONASE) 50 MCG/ACT nasal spray SHAKE LIQUID AND USE 1 SPRAY IN EACH NOSTRIL DAILY Patient taking differently: Place 1 spray into both nostrils daily.  04/16/19  Yes [name], MD  furosemide (LASIX) 20 MG tablet TAKE 1 TABLET(20 MG) BY MOUTH DAILY AS NEEDED FOR EDEMA Patient taking differently: Take 20 mg by mouth daily as needed for edema.  04/14/19  Yes [name], MD  metoprolol tartrate (LOPRESSOR) 25 MG tablet TAKE 1/2 TABLET(12.5 MG) BY MOUTH TWICE DAILY Patient taking differently: Take 12.5 mg by mouth 2 (two) times daily.  04/06/19  Yes [name], MD  omeprazole (PRILOSEC) 20 MG capsule TAKE 1 CAPSULE BY MOUTH EVERY DAY Patient taking differently: Take 20 mg by mouth daily.  09/29/18  Yes [name], MD  potassium chloride SA (K-DUR,KLOR-CON) 20 MEQ tablet Take 0.5 tablets (10 mEq total) by mouth daily. 04/28/18  Yes [name], MD  pravastatin (PRAVACHOL) 20 MG tablet Take 1 tablet (20 mg total) by mouth daily. 04/15/19  Yes [name], MD  traMADol (ULTRAM) 50 MG tablet TAKE 1 TABLET(50 MG) BY MOUTH EVERY 12 HOURS AS NEEDED Patient taking differently: Take 50 mg by mouth every 12 (twelve) hours as needed for moderate pain.  12/19/18  Yes [name], MD  fexofenadine [facility] ALLERGY) 180 MG tablet Take 1 tablet (180 mg total) by mouth daily. Patient not taking: Reported on 03/20/2019 10/31/17   Lorella Nimrod, MD  lisinopril (ZESTRIL) 5 MG tablet TAKE 1 TABLET BY MOUTH EVERY DAY Patient not taking: Reported on 03/20/2019 03/05/19   [name], MD  sodium chloride (OCEAN) 0.65 % SOLN nasal spray Place 1 spray into both nostrils as needed for congestion. Patient not taking: Reported on 03/20/2019 10/31/17   Lorella Nimrod, MD    Physical  Exam: Vitals:   07/10/19 2030 07/10/19 2100 07/10/19 2130 07/10/19 2200  BP: 121/67 (!) 145/69 134/65 (!) 151/73  Pulse: (!) 103 99 97 99  Resp: 20 (!) 21 15 19   Temp:      TempSrc:      SpO2: 97% 95% 95% 94%  Weight:        Constitutional: No acute distress Head: Atraumatic Eyes: Conjunctiva clear ENM: Moist mucous membranes. Normal dentition.  Neck: Supple Respiratory: audible wheeze, scattered exp wheeze on  auscultation, no increased wob Cardiovascular: Regular rate and rhythm. No murmurs/rubs/gallops. Abdomen: Non-tender, non-distended. No masses. No rebound or guarding. Positive bowel sounds. Musculoskeletal: No joint deformity upper and lower extremities. Pain with passive rom of right hip. No warmth Skin: scattered non-blanching erythematous macules b/l lower extremities  Extremities: trace LE edema. Palpable peripheral pulses. Neurologic: Alert, moving all 4 extremities. Psychiatric: Normal insight and judgement.   Labs on Admission: I have personally reviewed following labs and imaging studies  CBC: Recent Labs  Lab 07/10/19 1735  WBC 107.4*  NEUTROABS 93.5*  HGB 10.0*  HCT 30.9*  MCV 98.7  PLT [id number]*   Basic Metabolic Panel: Recent Labs  Lab 07/10/19 1735  NA 135  K 4.4  CL 96*  CO2 23  GLUCOSE 93  BUN 26*  CREATININE 1.49*  CALCIUM 9.4   GFR: Estimated Creatinine Clearance: 46 mL/min (A) (by C-G formula based on SCr of 1.49 mg/dL (H)). Liver Function Tests: Recent Labs  Lab 07/10/19 1735  AST 19  ALT 15  ALKPHOS 331*  BILITOT 0.4  PROT 8.3*  ALBUMIN 3.7   No results for input(s): LIPASE, AMYLASE in the last 168 hours. No results for input(s): AMMONIA in the last 168 hours. Coagulation Profile: Recent Labs  Lab 07/10/19 1735  INR 1.3*   Cardiac Enzymes: No results for input(s): CKTOTAL, CKMB, CKMBINDEX, TROPONINI in the last 168 hours. BNP (last 3 results) No results for input(s): PROBNP in the last 8760 hours. HbA1C: No results  for input(s): HGBA1C in the last 72 hours. CBG: No results for input(s): GLUCAP in the last 168 hours. Lipid Profile: No results for input(s): CHOL, HDL, LDLCALC, TRIG, CHOLHDL, LDLDIRECT in the last 72 hours. Thyroid Function Tests: No results for input(s): TSH, T4TOTAL, FREET4, T3FREE, THYROIDAB in the last 72 hours. Anemia Panel: No results for input(s): VITAMINB12, FOLATE, FERRITIN, TIBC, IRON, RETICCTPCT in the last 72 hours. Urine analysis:    Component Value Date/Time   COLORURINE YELLOW 05/29/2017 1416   APPEARANCEUR CLEAR 05/29/2017 1416   LABSPEC 1.024 05/29/2017 1416   PHURINE 5.0 05/29/2017 1416   GLUCOSEU NEGATIVE 05/29/2017 1416   HGBUR SMALL (A) 05/29/2017 1416   BILIRUBINUR NEGATIVE 05/29/2017 1416   [place] 05/29/2017 1416   PROTEINUR NEGATIVE 05/29/2017 1416   UROBILINOGEN 1.0 09/09/2012 0051   NITRITE NEGATIVE 05/29/2017 1416   LEUKOCYTESUR NEGATIVE 05/29/2017 1416    Radiological Exams on Admission: Ct Pelvis Wo Contrast  Result Date: 07/10/2019 CLINICAL DATA:  Hip pain, chronic possible lytic lesion left sacral ala. EXAM: CT PELVIS WITHOUT CONTRAST TECHNIQUE: Multidetector CT imaging of the pelvis was performed following the standard protocol without intravenous contrast. COMPARISON:  Plain film study of the same date., bone biopsy images of 06/24/2017. FINDINGS: Urinary Tract:  Choose 1 Bowel: No signs of acute bowel process within visualized portions of the bowel. Vascular/Lymphatic: Not well evaluated given lack of intravenous contrast. Signs of calcific atherosclerotic change throughout the distal abdominal aorta and the iliac vasculature. Reproductive:  Calcified fibroid in the uterine fundus. Other:  No signs of ascites. Musculoskeletal: Signs of bony sclerosis about the left sacroiliac joint, now associated with more bony erosion and indistinct margins than on the previous exam. Widening of the joint since the prior exam. Subtle stranding along the  anterior margin of the joint becoming more pronounced inferiorly. The hips are located bilaterally. IMPRESSION: 1. Signs of bony sclerosis about the left sacroiliac joint, now associated with more bony erosion and indistinct margins than on the  previous exam. Findings are concerning for septic arthritis versus active sacroiliitis. No signs of fracture. Small cortical 2. Signs of calcific atherosclerotic change throughout the distal abdominal aorta and iliac vasculature. 3. Calcified fibroid in the uterine fundus. 4. Aortic atherosclerosis. Aortic Atherosclerosis (ICD10-I70.0). Electronically Signed   By: Zetta Bills M.D.   On: 07/10/2019 19:02   Dg Hip Unilat W Or Wo Pelvis 2-3 Views Left  Result Date: 07/10/2019 CLINICAL DATA:  Left-sided hip pain EXAM: DG HIP (WITH OR WITHOUT PELVIS) 2-3V LEFT COMPARISON:  CT 05/29/2017 FINDINGS: No fracture or malalignment. Mild joint space narrowing. Pubic symphysis and rami appear intact. Questionable lytic change involving the left sacral ala. IMPRESSION: 1. No acute osseous abnormality of left hip. Mild degenerative change 2. Questionable lytic process involving the left sacral ala. CT could be obtained for further evaluation. Electronically Signed   By: Donavan Foil M.D.   On: 07/10/2019 17:34    EKG: pending  Assessment/Plan Principal Problem:   Myeloproliferative disorder ([place]) Active Problems:   Obesity, Class III, BMI 40-49.9 (morbid obesity) ([place])   Essential hypertension   Moderate persistent asthma   AF (paroxysmal atrial fibrillation) ([facility])   AKI (acute kidney injury) ([place])   # Myeloproliferative disorder - given worsening changes on cbc (severe leukocytosis, new mild thrombocytopenia) and bony changes on CT  Of SI joint, concern for worsening of myeloproliferative disorder, possible bone involvement, possible tumor lysis syndrome given elevated uric acid and aki. Hematology consulted, advising fluids and allopurinol. Radiographic reading  suggests possible septic joint. Although patient is afebrile, she does exhibit pain with passive ROM - ns @ 100; allopurinol bid - appreciate hematology recs - think prudent to initiate treatment for possible septic arthritis (vanc/cefepime). Likely de-escalate if further w/u continues to point to oncologic etiology  # AKI - see above, may be 2/2 tumor lysis - fluids as above, cr in the AM  # moderate persistent asthma - wheezing on exam, but pt says this is her baseline. Normal o2, no increased wob, no covid contacts. Says only takes home advair prn. - standing dulera (substitute for advair); albuterol prn - f/u cxr and covid test - contact/airborne precautions for now  # paroxysmal a fib - appears to be in sinus rhythm - f/u ekg - cont apixaban and metoprolol - hold home aspirin, unclear why taking given anticoagulation - hold home prn lasix  # htn - here bp mildly elevated - resume home amlodipine and metoprolol - hold home lisinopril given aki - cont home pravastatin  # chronic pain - cont home tramadol prn, hip pain is relatively mild when pt not moving  DVT prophylaxis: home apixaban Code Status: full code  Family Communication: sons chris and steve  Disposition Plan: tbd  Consults called: heme/onc  Admission status: med/surg    Desma Maxim MD [facility] Pager [phone]  If 7PM-7AM, please contact night-coverage [email or web address] Password [email or web address]  07/10/2019, 10:39 PM

## 2019-07-11 ENCOUNTER — Encounter (HOSPITAL_COMMUNITY): Payer: Self-pay

## 2019-07-11 DIAGNOSIS — D471 Chronic myeloproliferative disease: Secondary | ICD-10-CM

## 2019-07-11 DIAGNOSIS — D72829 Elevated white blood cell count, unspecified: Secondary | ICD-10-CM

## 2019-07-11 DIAGNOSIS — D649 Anemia, unspecified: Secondary | ICD-10-CM

## 2019-07-11 DIAGNOSIS — D696 Thrombocytopenia, unspecified: Secondary | ICD-10-CM

## 2019-07-11 LAB — URINALYSIS, ROUTINE W REFLEX MICROSCOPIC
Bilirubin Urine: NEGATIVE
Glucose, UA: NEGATIVE mg/dL
Ketones, ur: 5 mg/dL — AB
Nitrite: NEGATIVE
Protein, ur: NEGATIVE mg/dL
Specific Gravity, Urine: 1.021 (ref 1.005–1.030)
pH: 5 (ref 5.0–8.0)

## 2019-07-11 LAB — PHOSPHORUS: Phosphorus: 4.2 mg/dL (ref 2.5–4.6)

## 2019-07-11 LAB — CBC
HCT: 29.7 % — ABNORMAL LOW (ref 36.0–46.0)
Hemoglobin: 9.1 g/dL — ABNORMAL LOW (ref 12.0–15.0)
MCH: 30.4 pg (ref 26.0–34.0)
MCHC: 30.6 g/dL (ref 30.0–36.0)
MCV: 99.3 fL (ref 80.0–100.0)
Platelets: 129 10*3/uL — ABNORMAL LOW (ref 150–400)
RBC: 2.99 MIL/uL — ABNORMAL LOW (ref 3.87–5.11)
RDW: 15.3 % (ref 11.5–15.5)
WBC: 92.2 10*3/uL (ref 4.0–10.5)
nRBC: 0 % (ref 0.0–0.2)

## 2019-07-11 LAB — PROTEIN / CREATININE RATIO, URINE
Creatinine, Urine: 160.27 mg/dL
Protein Creatinine Ratio: 0.37 mg/mg{Cre} — ABNORMAL HIGH (ref 0.00–0.15)
Total Protein, Urine: 59 mg/dL

## 2019-07-11 LAB — SARS CORONAVIRUS 2 (TAT 6-24 HRS): SARS Coronavirus 2: NEGATIVE

## 2019-07-11 LAB — CK: Total CK: 161 U/L (ref 38–234)

## 2019-07-11 LAB — ALBUMIN: Albumin: 3.1 g/dL — ABNORMAL LOW (ref 3.5–5.0)

## 2019-07-11 LAB — MAGNESIUM: Magnesium: 1.8 mg/dL (ref 1.7–2.4)

## 2019-07-11 MED ORDER — TRAMADOL HCL 50 MG PO TABS
50.0000 mg | ORAL_TABLET | Freq: Two times a day (BID) | ORAL | Status: DC | PRN
  Administered 2019-07-11 – 2019-07-12 (×3): 50 mg via ORAL
  Filled 2019-07-11 (×3): qty 1

## 2019-07-11 MED ORDER — AMLODIPINE BESYLATE 10 MG PO TABS
10.0000 mg | ORAL_TABLET | Freq: Every day | ORAL | Status: DC
  Administered 2019-07-11 – 2019-07-28 (×17): 10 mg via ORAL
  Filled 2019-07-11 (×18): qty 1

## 2019-07-11 MED ORDER — SODIUM CHLORIDE 0.9 % IV SOLN
INTRAVENOUS | Status: DC
  Administered 2019-07-11 – 2019-07-12 (×4): via INTRAVENOUS

## 2019-07-11 MED ORDER — MOMETASONE FURO-FORMOTEROL FUM 200-5 MCG/ACT IN AERO
2.0000 | INHALATION_SPRAY | Freq: Two times a day (BID) | RESPIRATORY_TRACT | Status: DC
  Administered 2019-07-11 – 2019-07-28 (×33): 2 via RESPIRATORY_TRACT
  Filled 2019-07-11 (×2): qty 8.8

## 2019-07-11 MED ORDER — PANTOPRAZOLE SODIUM 40 MG PO TBEC
40.0000 mg | DELAYED_RELEASE_TABLET | Freq: Every day | ORAL | Status: DC
  Administered 2019-07-11 – 2019-07-28 (×17): 40 mg via ORAL
  Filled 2019-07-11 (×18): qty 1

## 2019-07-11 MED ORDER — VANCOMYCIN HCL 10 G IV SOLR
1500.0000 mg | INTRAVENOUS | Status: DC
  Administered 2019-07-12: 1500 mg via INTRAVENOUS
  Filled 2019-07-11: qty 1500

## 2019-07-11 MED ORDER — SODIUM CHLORIDE 0.9 % IV SOLN
2.0000 g | INTRAVENOUS | Status: DC
  Administered 2019-07-11: 2 g via INTRAVENOUS
  Filled 2019-07-11: qty 20
  Filled 2019-07-11: qty 2

## 2019-07-11 MED ORDER — PRAVASTATIN SODIUM 20 MG PO TABS
20.0000 mg | ORAL_TABLET | Freq: Every day | ORAL | Status: DC
  Administered 2019-07-11 – 2019-07-28 (×17): 20 mg via ORAL
  Filled 2019-07-11 (×18): qty 1

## 2019-07-11 MED ORDER — ALBUTEROL SULFATE HFA 108 (90 BASE) MCG/ACT IN AERS
1.0000 | INHALATION_SPRAY | Freq: Four times a day (QID) | RESPIRATORY_TRACT | Status: DC | PRN
  Filled 2019-07-11: qty 6.7

## 2019-07-11 MED ORDER — APIXABAN 5 MG PO TABS
5.0000 mg | ORAL_TABLET | Freq: Two times a day (BID) | ORAL | Status: DC
  Administered 2019-07-11 – 2019-07-16 (×10): 5 mg via ORAL
  Filled 2019-07-11 (×12): qty 1

## 2019-07-11 MED ORDER — ALBUTEROL SULFATE (2.5 MG/3ML) 0.083% IN NEBU
2.5000 mg | INHALATION_SOLUTION | Freq: Four times a day (QID) | RESPIRATORY_TRACT | Status: DC | PRN
  Filled 2019-07-11: qty 3

## 2019-07-11 MED ORDER — METOPROLOL TARTRATE 25 MG PO TABS
12.5000 mg | ORAL_TABLET | Freq: Two times a day (BID) | ORAL | Status: DC
  Administered 2019-07-11 – 2019-07-13 (×6): 12.5 mg via ORAL
  Filled 2019-07-11 (×7): qty 1

## 2019-07-11 MED ORDER — SODIUM CHLORIDE 0.9 % IV SOLN
2.0000 g | Freq: Two times a day (BID) | INTRAVENOUS | Status: DC
  Filled 2019-07-11: qty 2

## 2019-07-11 MED ORDER — HYDROXYUREA 500 MG PO CAPS
500.0000 mg | ORAL_CAPSULE | Freq: Two times a day (BID) | ORAL | Status: DC
  Administered 2019-07-11 – 2019-07-17 (×11): 500 mg via ORAL
  Filled 2019-07-11 (×11): qty 1

## 2019-07-11 NOTE — Plan of Care (Signed)
?  Problem: Clinical Measurements: ?Goal: Will remain free from infection ?Outcome: Progressing ?  ?Problem: Clinical Measurements: ?Goal: Diagnostic test results will improve ?Outcome: Progressing ?  ?Problem: Clinical Measurements: ?Goal: Respiratory complications will improve ?Outcome: Progressing ?  ?Problem: Clinical Measurements: ?Goal: Cardiovascular complication will be avoided ?Outcome: Progressing ?  ?Problem: Elimination: ?Goal: Will not experience complications related to bowel motility ?Outcome: Progressing ?  ?Problem: Pain Managment: ?Goal: General experience of comfort will improve ?Outcome: Progressing ?  ?

## 2019-07-11 NOTE — Progress Notes (Signed)
PROGRESS NOTE    Erika [name]  VEH:[id number] DOB: [birth year] DOA: 07/10/2019 PCP: [name], MD  Outpatient Specialists:   Brief Narrative:  Patient is a 78 year old African-American female, morbidly obese with BMI of 48 kg/m, and past medical history significant for myeloproliferative neoplasm NOS, hypertension, asthma, paroxysmal atrial fibrillation and status post resection of lumbar spine mass.  Patient also has history of chronic lower back pain, with possible radiculopathy.  Patient presented with worsening right-sided lower back pain and the inability to ambulate.  No associated constitutional symptoms.  On presentation, patient's WBC was noted to be 107.4 (prior documentation on 10/07/2018 revealed WBC of 61.3), serum creatinine of 1.49 (up from 0.95), uric acid of 14.1 (could not visualize prior levels), normal potassium (4.4), normal calcium (9.5) and normal phosphorus (4.2).  ESR is 70, but has been chronically elevated.  CT scan of the pelvis without contrast revealed findings worrisome for left septic arthritis versus active sacroiliitis.  Chest x-ray was suspicious for posterior lower lobe infiltrates.  Apparently, patient has not followed up with the oncology team since March of this year due to COVID-19 related logistics (according to the patient).  Oncology team has been consulted.  Patient is currently on antibiotics for possible septic arthritis.   Assessment & Plan:   Principal Problem:   Myeloproliferative disorder ([place]) Active Problems:   Obesity, Class III, BMI 40-49.9 (morbid obesity) (HCC)   Essential hypertension   Moderate persistent asthma   AF (paroxysmal atrial fibrillation) (HCC)   AKI (acute kidney injury) (HCC)   Severe back pain/inability to ambulate/possible left-sided sacroiliitis versus septic left sacral iliac joint: -Patient reported right-sided back pain. -No history of left-sided back pain.  No reported constitutional symptoms (fever etc.)  -Possibility of septic left sacroiliac joint was suggested by imaging study. -Patient has chronically elevated ESR -Patient is already committed to IV antibiotics -We will have a low threshold to consult orthopedic team to further direct work-up for possible septic joint as there may be need to de-escalate antibiotics  Myeloproliferative Neoplasm NOS{ -Hematology/Oncology input is appreciated -Discussed with Dr. Irene Limbo, Oncologist. -Further work-up is in progress, including repeat bone marrow aspiration/biopsy (ruling out worsening myeloproliferative neoplasm NOS versus AML transformation).  Elevated uric acid level: -Uric acid is 14.1. -Last uric acid level documented was then 2015, and it was 6.9. -Calcium is normal, potassium is normal and phosphorus is normal. -Mild AKI noted (serum creatinine of 1.49, up from 0.95.  UA revealed specific gravity of 1.021) -Tumor lysis syndrome is being questioned, however, patient may not meet criteria for tumor lysis syndrome. -Repeat uric acid level. -Monitor renal function closely. -Further management depend on hospital course.  Paroxysmal atrial fibrillation: Heart rate is controlled Continue current medication (patient is on metoprolol 12.5 mg p.o. twice daily and apixaban 5 mg p.o. twice daily).  Hypertension: Controlled. Continue current management.  Morbid obesity: Further management on outpatient basis.  Possible lower lobe infiltrates as per chest x-ray: -No pneumonic symptoms. -May be related to body habitus. -Check procalcitonin -Incentive and flutter valve device  DVT prophylaxis: Apixaban. Code Status: Full code Family Communication:  Disposition Plan: This will depend on hospital course   Consultants:   Hematology/oncology  We will have low threshold to consult orthopedic team.  Procedures:   None  Antimicrobials:   IV vancomycin  IV ceftriaxone started on 07/11/2019.  IV cefepime discontinued    Subjective: No new complaints No fever or chills No coughing No chest pain no shortness of breath  Back pain persists  Objective: Vitals:   07/11/19 1004 07/11/19 1016 07/11/19 1119 07/11/19 1404  BP:  (!) 119/56 (!) 119/56 139/66  Pulse:  98 98 94  Resp:  18 18 18   Temp:  98.1 F (36.7 C)  98.9 F (37.2 C)  TempSrc:  Oral  Oral  SpO2: 90% 95%  96%  Weight:   135.1 kg   Height:   5' 6"  (1.676 m)     Intake/Output Summary (Last 24 hours) at 07/11/2019 1445 Last data filed at 07/11/2019 1236 Gross per 24 hour  Intake 2942.87 ml  Output 300 ml  Net 2642.87 ml   Filed Weights   07/10/19 1639 07/11/19 1119  Weight: 134.7 kg 135.1 kg    Examination:  General exam: Appears calm and comfortable.  Patient is morbidly obese. Respiratory system: Clear to auscultation. Respiratory effort normal. Cardiovascular system: S1 & S2 heard Gastrointestinal system: Abdomen is morbidly obese, soft and nontender. No organomegaly or masses felt. Normal bowel sounds heard. Central nervous system: Alert and oriented.  Patient moves all extremities.   Extremities: Chronic mild edema/fullness of the ankle  Data Reviewed: I have personally reviewed following labs and imaging studies  CBC: Recent Labs  Lab 07/10/19 1735 07/11/19 0500  WBC 107.4* 92.2*  NEUTROABS 93.5*  --   HGB 10.0* 9.1*  HCT 30.9* 29.7*  MCV 98.7 99.3  PLT 145* 920*   Basic Metabolic Panel: Recent Labs  Lab 07/10/19 1735  NA 135  K 4.4  CL 96*  CO2 23  GLUCOSE 93  BUN 26*  CREATININE 1.49*  CALCIUM 9.4   GFR: Estimated Creatinine Clearance: 45.4 mL/min (A) (by C-G formula based on SCr of 1.49 mg/dL (H)). Liver Function Tests: Recent Labs  Lab 07/10/19 1735  AST 19  ALT 15  ALKPHOS 331*  BILITOT 0.4  PROT 8.3*  ALBUMIN 3.7   No results for input(s): LIPASE, AMYLASE in the last 168 hours. No results for input(s): AMMONIA in the last 168 hours. Coagulation Profile: Recent Labs  Lab 07/10/19 1735   INR 1.3*   Cardiac Enzymes: No results for input(s): CKTOTAL, CKMB, CKMBINDEX, TROPONINI in the last 168 hours. BNP (last 3 results) No results for input(s): PROBNP in the last 8760 hours. HbA1C: No results for input(s): HGBA1C in the last 72 hours. CBG: No results for input(s): GLUCAP in the last 168 hours. Lipid Profile: No results for input(s): CHOL, HDL, LDLCALC, TRIG, CHOLHDL, LDLDIRECT in the last 72 hours. Thyroid Function Tests: No results for input(s): TSH, T4TOTAL, FREET4, T3FREE, THYROIDAB in the last 72 hours. Anemia Panel: No results for input(s): VITAMINB12, FOLATE, FERRITIN, TIBC, IRON, RETICCTPCT in the last 72 hours. Urine analysis:    Component Value Date/Time   COLORURINE YELLOW 05/29/2017 1416   APPEARANCEUR CLEAR 05/29/2017 1416   LABSPEC 1.024 05/29/2017 1416   PHURINE 5.0 05/29/2017 1416   GLUCOSEU NEGATIVE 05/29/2017 1416   HGBUR SMALL (A) 05/29/2017 1416   BILIRUBINUR NEGATIVE 05/29/2017 1416   [place] 05/29/2017 1416   PROTEINUR NEGATIVE 05/29/2017 1416   UROBILINOGEN 1.0 09/09/2012 0051   NITRITE NEGATIVE 05/29/2017 1416   LEUKOCYTESUR NEGATIVE 05/29/2017 1416   Sepsis Labs: @LABRCNTIP (procalcitonin:4,lacticidven:4)  ) Recent Results (from the past 240 hour(s))  SARS CORONAVIRUS 2 (TAT 6-24 HRS) Nasopharyngeal Nasopharyngeal Swab     Status: None   Collection Time: 07/10/19  9:21 PM   Specimen: Nasopharyngeal Swab  Result Value Ref Range Status   SARS Coronavirus 2 NEGATIVE NEGATIVE Final  Comment: (NOTE) SARS-CoV-2 target nucleic acids are NOT DETECTED. The SARS-CoV-2 RNA is generally detectable in upper and lower respiratory specimens during the acute phase of infection. Negative results do not preclude SARS-CoV-2 infection, do not rule out co-infections with other pathogens, and should not be used as the sole basis for treatment or other patient management decisions. Negative results must be combined with clinical  observations, patient history, and epidemiological information. The expected result is Negative. Fact Sheet for Patients: SugarRoll.be Fact Sheet for Healthcare Providers: https://www.woods-mathews.com/ This test is not yet approved or cleared by the [place] FDA and  has been authorized for detection and/or diagnosis of SARS-CoV-2 by FDA under an Emergency Use Authorization (EUA). This EUA will remain  in effect (meaning this test can be used) for the duration of the COVID-19 declaration under Section 56 4(b)(1) of the Act, 21 U.S.C. section 360bbb-3(b)(1), unless the authorization is terminated or revoked sooner. Performed at [facility], [place] [address], [place], [place] [address]          Radiology Studies: Chest Xray 2 View  Result Date: 07/10/2019 CLINICAL DATA:  Wheezing EXAM: CHEST - 2 VIEW COMPARISON:  05/29/2017 FINDINGS: Normal cardiomediastinal silhouette without pneumothorax or pleural effusion. Patchy airspace opacities over the posterior lung bases on lateral view. IMPRESSION: 1. Suspect posterior lower lobe infiltrates on the lateral view. 2. Mild cardiomegaly Electronically Signed   By: Donavan Foil M.D.   On: 07/10/2019 22:53   Ct Pelvis Wo Contrast  Result Date: 07/10/2019 CLINICAL DATA:  Hip pain, chronic possible lytic lesion left sacral ala. EXAM: CT PELVIS WITHOUT CONTRAST TECHNIQUE: Multidetector CT imaging of the pelvis was performed following the standard protocol without intravenous contrast. COMPARISON:  Plain film study of the same date., bone biopsy images of 06/24/2017. FINDINGS: Urinary Tract:  Choose 1 Bowel: No signs of acute bowel process within visualized portions of the bowel. Vascular/Lymphatic: Not well evaluated given lack of intravenous contrast. Signs of calcific atherosclerotic change throughout the distal abdominal aorta and the iliac vasculature. Reproductive:  Calcified fibroid in the  uterine fundus. Other:  No signs of ascites. Musculoskeletal: Signs of bony sclerosis about the left sacroiliac joint, now associated with more bony erosion and indistinct margins than on the previous exam. Widening of the joint since the prior exam. Subtle stranding along the anterior margin of the joint becoming more pronounced inferiorly. The hips are located bilaterally. IMPRESSION: 1. Signs of bony sclerosis about the left sacroiliac joint, now associated with more bony erosion and indistinct margins than on the previous exam. Findings are concerning for septic arthritis versus active sacroiliitis. No signs of fracture. Small cortical 2. Signs of calcific atherosclerotic change throughout the distal abdominal aorta and iliac vasculature. 3. Calcified fibroid in the uterine fundus. 4. Aortic atherosclerosis. Aortic Atherosclerosis (ICD10-I70.0). Electronically Signed   By: Zetta Bills M.D.   On: 07/10/2019 19:02   Dg Hip Unilat W Or Wo Pelvis 2-3 Views Left  Result Date: 07/10/2019 CLINICAL DATA:  Left-sided hip pain EXAM: DG HIP (WITH OR WITHOUT PELVIS) 2-3V LEFT COMPARISON:  CT 05/29/2017 FINDINGS: No fracture or malalignment. Mild joint space narrowing. Pubic symphysis and rami appear intact. Questionable lytic change involving the left sacral ala. IMPRESSION: 1. No acute osseous abnormality of left hip. Mild degenerative change 2. Questionable lytic process involving the left sacral ala. CT could be obtained for further evaluation. Electronically Signed   By: Donavan Foil M.D.   On: 07/10/2019 17:34  Scheduled Meds: . allopurinol  150 mg Oral BID  . amLODipine  10 mg Oral Daily  . apixaban  5 mg Oral BID  . metoprolol tartrate  12.5 mg Oral BID  . mometasone-formoterol  2 puff Inhalation BID  . pantoprazole  40 mg Oral Daily  . pravastatin  20 mg Oral Daily   Continuous Infusions: . sodium chloride 100 mL/hr at 07/11/19 1026  . cefTRIAXone (ROCEPHIN)  IV    . [START ON  07/12/2019] vancomycin       LOS: 1 day    Time spent: 35 minutes   Dana Allan, MD  [facility] Pager #: [phone] 7PM-7AM contact night coverage as above

## 2019-07-11 NOTE — Progress Notes (Signed)
Pt had high hr and increased respiratory rate in ed. Mds are aware. Hospitialist was paged.

## 2019-07-11 NOTE — Progress Notes (Signed)
Pharmacy Antibiotic Note  Erika [name] is a 78 y.o. female admitted on 07/10/2019 with Septic arthritis.  Pharmacy has been consulted for Vancomycin, cefepime dosing.  Plan: Vancomycin 2gm iv x1, then Vancomycin 1500 mg IV Q 36 hrs. Goal AUC 400-550. Expected AUC: 489 SCr used: 1.49---Vd 0.5  Cefepime 2gm iv x1, then 2gm iv q12hr  Weight: 297 lb (134.7 kg)  Temp (24hrs), Avg:97.6 F (36.4 C), Min:97.6 F (36.4 C), Max:97.6 F (36.4 C)  Recent Labs  Lab 07/10/19 1735 07/10/19 1931 07/11/19 0500  WBC 107.4*  --  PENDING  CREATININE 1.49*  --   --   LATICACIDVEN  --  1.2  --     Estimated Creatinine Clearance: 46 mL/min (A) (by C-G formula based on SCr of 1.49 mg/dL (H)).    Allergies  Allergen Reactions  . Prednisone Other (See Comments)    REACTION: Psychosis, talking out of head, insomnia    Antimicrobials this admission: Vancomycin 07/11/2019 >> Cefepime 07/11/2019 >>   Dose adjustments this admission: -  Microbiology results: - Thank you for allowing pharmacy to be a part of this patient's care.  Nani Skillern Crowford 07/11/2019 6:22 AM

## 2019-07-11 NOTE — ED Notes (Signed)
Date and time results received: 07/11/19 6:44 AM  Test: WBC Critical Value: 92.2  Name of Provider Notified: Marthenia Rolling, MD  Orders Received? Or Actions Taken?:

## 2019-07-11 NOTE — Consult Note (Signed)
[name]    HEMATOLOGY/ONCOLOGY CONSULTATION NOTE  Date of Service: 07/11/2019  Patient Care Team: [name], MD as PCP - General (Internal Medicine) Brunetta Genera, MD as Consulting Physician (Hematology)  CHIEF COMPLAINTS/PURPOSE OF CONSULTATION:  Leucocytosis, anemia, thrombocytopenia-- ? Progression of MPN ? AML transformation TLS  HISTORY OF PRESENTING ILLNESS:   Erika [name] is a wonderful 78 y.o. female who has been referred to [place] by Dr .Si Raider, Ailene Rud, MD  for evaluation and management of Leucocytosis, anemia, thrombocytopenia-- ? Progression of MPN ? AML transformation and TLS.  Patient has a history of hypertension, asthma, paroxysmal atrial fibrillation on Eliquis, myeloproliferative neoplasm NOS with CHEK2 deletion (never previously treated), previous history of lumbar spinal surgery.  She presented with low back pain that was described as moderate to severe with no new neurological deficits in her lower extremity no bowel bladder dysfunction no saddle anesthesia.  CT of the pelvis showed bony sclerosis and bony erosion around the left sacroiliac joint concerning for active sacroiliitis versus septic arthritis.  Patient was last seen by me in clinic for her MPN on 10/07/2018 at which time she had a WBC count of 61k with some early anemia of 9.7 and normal platelets of 153k.  There was some concerns of a viral process and she was asked to return to clinic in 6 weeks for continued monitoring and management of her MPN but had decided to stop following up in clinic.  Today in the hospital the patient shows a WBC count of 92-107k, hemoglobin of 9.1 and platelet count of 129k. Uric acid was elevated to 14.1 in the emergency room and she has been started on allopurinol and IV fluids.  Patient is on the hospitalist service and is undergoing pain management.  She is on IV antibiotics for possible sepsis and possible septic arthritis.  Pan culture in process.  Chest x-ray  showed possible infiltrates and mild cardiomegaly.  UA showed no increase in WBCs trace leuk esterase.  Patient notes no acute new symptoms other than her lower back pain.  Denies any fever chills rigors night sweats or other bone pains.  No issues with bleeding.    MEDICAL HISTORY:  Past Medical History:  Diagnosis Date   Anemia    with menses   Asthma    Back pain    status post surgery 2002   Breast cyst    Excesion with FNA, begnin in 2004.    Degenerative joint disease of spine    Imaging 2005,  Degenerative hypertrophic facet arthritis changes L4-5 and L5-S1.[name]    History of shingles    Recurrent with post herpetic neuralgia.    Hypertension    Lymphadenopathy    Of the mediastinum, Right side CXR 2008, not read on 2010 cxr.    Menopause    Obesity    BMI 54   Psychosis ([place])    Secondary to prednisone.   Shingles    Stasis dermatitis    W/ LE edema, prviously on lasix now on mazxide.    SVT (supraventricular tachycardia) ([place]) June 2009   one run while hospitalized   Tuberculosis    active TB treated in 2002, hx of paraspinal lumbar TB,     SURGICAL HISTORY: Past Surgical History:  Procedure Laterality Date   BREAST CYST ASPIRATION Left    Breast cyst biopsy     CHOLECYSTECTOMY  02/02/2012   Procedure: LAPAROSCOPIC CHOLECYSTECTOMY;  Surgeon: Zenovia Jarred, MD;  Location: [facility];  Service: General;  Laterality: N/A;   Hemilaminectomy of L4, L5 and S1 decompression of tumor in epidural space  2000    SOCIAL HISTORY: Social History   Socioeconomic History   Marital status: Widowed    Spouse name: Not on file   Number of children: Not on file   Years of education: Not on file   Highest education level: Not on file  Occupational History   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   Occupation: volunteerd at [employer]: Not on file   Food insecurity    Worry: Not on file     Inability: Not on file   Transportation needs    Medical: Not on file    Non-medical: Not on file  Tobacco Use   Smoking status: Never Smoker   Smokeless tobacco: Never Used  Substance and Sexual Activity   Alcohol use: No    Alcohol/week: 0.0 standard drinks   Drug use: No   Sexual activity: Not Currently    Birth control/protection: Post-menopausal  Lifestyle   Physical activity    Days per week: Not on file    Minutes per session: Not on file   Stress: Not on file  Relationships   Social connections    Talks on phone: Not on file    Gets together: Not on file    Attends religious service: Not on file    Active member of club or organization: Not on file    Attends meetings of clubs or organizations: Not on file    Relationship status: Not on file   Intimate partner violence    Fear of current or ex partner: Not on file    Emotionally abused: Not on file    Physically abused: Not on file    Forced sexual activity: Not on file  Other Topics Concern   Not on file  Social History Narrative   Current Social History 03/20/2019        Patient lives alone in a ground floor apartment which is 1 story. There are not steps up to the entrance the patient uses. One son stays with her once in a while.      Patient's method of transportation is personal car.      The highest level of education was some high school: 11th grade.      The patient currently retired.      Identified important Relationships are "My friend Mart Piggs, my nieces, my grandkids, a lot of people.       Pets : None       Interests / Fun: "Playing word games on my phone, watch TV, Talking on phone."       Current Stressors: "Nothing, really. I try not to let anything stress me."       Religious / Personal Beliefs: "I believe in God."       L. Ducatte, RN, BSN     FAMILY HISTORY: Family History  Problem Relation Age of Onset   Stroke Mother        at young age   Heart disease Mother      Heart attack Father    Heart disease Father    Cancer Sister        Unknown   Stroke Brother    Stroke Brother    Breast cancer Other    Sickle cell anemia Brother    Diabetes Brother    Stroke Brother  ALLERGIES:  is allergic to prednisone.  MEDICATIONS:  Current Facility-Administered Medications  Medication Dose Route Frequency Provider Last Rate Last Dose   0.9 %  sodium chloride infusion   Intravenous Continuous Wouk, Ailene Rud, MD 100 mL/hr at 07/11/19 1026     albuterol (PROVENTIL) (2.5 MG/3ML) 0.083% nebulizer solution 2.5 mg  2.5 mg Nebulization Q6H PRN Wouk, Ailene Rud, MD       allopurinol (ZYLOPRIM) tablet 150 mg  150 mg Oral BID Gwynne Edinger, MD   150 mg at 07/11/19 0815   amLODipine (NORVASC) tablet 10 mg  10 mg Oral Daily Gwynne Edinger, MD   10 mg at 07/11/19 3267   apixaban (ELIQUIS) tablet 5 mg  5 mg Oral BID Gwynne Edinger, MD   5 mg at 07/11/19 0815   cefTRIAXone (ROCEPHIN) 2 g in sodium chloride 0.9 % 100 mL IVPB  2 g Intravenous Q24H Dana Allan I, MD 200 mL/hr at 07/11/19 1618 2 g at 07/11/19 1618   hydroxyurea (HYDREA) capsule 500 mg  500 mg Oral BID WC Brunetta Genera, MD       metoprolol tartrate (LOPRESSOR) tablet 12.5 mg  12.5 mg Oral BID Gwynne Edinger, MD   12.5 mg at 07/11/19 1245   mometasone-formoterol (DULERA) 200-5 MCG/ACT inhaler 2 puff  2 puff Inhalation BID Gwynne Edinger, MD   2 puff at 07/11/19 1004   pantoprazole (PROTONIX) EC tablet 40 mg  40 mg Oral Daily Gwynne Edinger, MD   40 mg at 07/11/19 0815   pravastatin (PRAVACHOL) tablet 20 mg  20 mg Oral Daily Gwynne Edinger, MD   20 mg at 07/11/19 0815   traMADol (ULTRAM) tablet 50 mg  50 mg Oral Q12H PRN Gwynne Edinger, MD   50 mg at 07/11/19 0816   [START ON 07/12/2019] vancomycin (VANCOCIN) 1,500 mg in sodium chloride 0.9 % 500 mL IVPB  1,500 mg Intravenous Q36H Wouk, Ailene Rud, MD        REVIEW OF SYSTEMS:    10  Point review of Systems was done is negative except as noted above.  PHYSICAL EXAMINATION: ECOG PERFORMANCE STATUS: 2 - Symptomatic, <50% confined to bed  . Vitals:   07/11/19 1119 07/11/19 1404  BP: (!) 119/56 139/66  Pulse: 98 94  Resp: 18 18  Temp:  98.9 F (37.2 C)  SpO2:  96%   Filed Weights   07/10/19 1639 07/11/19 1119  Weight: 297 lb (134.7 kg) 297 lb 13.5 oz (135.1 kg)   .Body mass index is 48.07 kg/m.  GENERAL:alert, in no acute distress and comfortable SKIN: no acute rashes, no significant lesions EYES: conjunctiva are pink and non-injected, sclera anicteric OROPHARYNX: MMM, no exudates, no oropharyngeal erythema or ulceration NECK: supple, no JVD LYMPH:  no palpable lymphadenopathy in the cervical, axillary or inguinal regions LUNGS: clear to auscultation b/l with normal respiratory effort HEART: regular rate & rhythm ABDOMEN:  normoactive bowel sounds , non tender, not distended. Extremity: no pedal edema PSYCH: alert & oriented x 3 with fluent speech NEURO: no focal motor/sensory deficits  LABORATORY DATA:  I have reviewed the data as listed  . CBC Latest Ref Rng & Units 07/11/2019 07/10/2019 10/07/2018  WBC 4.0 - 10.5 K/uL 92.2(HH) 107.4(HH) 61.3(HH)  Hemoglobin 12.0 - 15.0 g/dL 9.1(L) 10.0(L) 9.7(L)  Hematocrit 36.0 - 46.0 % 29.7(L) 30.9(L) 31.1(L)  Platelets 150 - 400 K/uL 129(L) 145(L) 153    . CMP Latest Ref Rng & Units  07/10/2019 10/07/2018 07/04/2018  Glucose 70 - 99 mg/dL 93 84 78  BUN 8 - 23 mg/dL 26(H) 8 13  Creatinine 0.44 - 1.00 mg/dL 1.49(H) 0.95 1.14(H)  Sodium 135 - 145 mmol/L 135 140 142  Potassium 3.5 - 5.1 mmol/L 4.4 4.1 3.8  Chloride 98 - 111 mmol/L 96(L) 106 106  CO2 22 - 32 mmol/L _0 Calcium 8.9 - 10.3 mg/dL 9.4 8.6(L) 8.8(L)  Total Protein 6.5 - 8.1 g/dL 8.3(H) 7.6 7.8  Total Bilirubin 0.3 - 1.2 mg/dL 0.4 0.4 0.3  Alkaline Phos 38 - 126 U/L 331(H) 208(H) 228(H)  AST 15 - 41 U/L 19 13(L) 12(L)  ALT 0 - 44 U/L _1 Component     Latest Ref Rng & Units 07/10/2019 07/11/2019  Creatinine, Urine     mg/dL  160.27  Total Protein, Urine     mg/dL  59  Protein Creatinine Ratio     0.00 - 0.15 mg/mgCre  0.37 (H)  Prothrombin Time     11.4 - 15.2 seconds 15.7 (H)   INR     0.8 - 1.2 1.3 (H)   APTT     24 - 36 seconds 48 (H)   Lactic Acid, Venous     0.5 - 1.9 mmol/L 1.2   Sed Rate     0 - 22 mm/hr 70 (H)   CRP     <1.0 mg/dL 1.9 (H)   Uric Acid, Serum     2.5 - 7.1 mg/dL 14.1 (H)      RADIOGRAPHIC STUDIES: I have personally reviewed the radiological images as listed and agreed with the findings in the report. Chest Xray 2 View  Result Date: 07/10/2019 CLINICAL DATA:  Wheezing EXAM: CHEST - 2 VIEW COMPARISON:  05/29/2017 FINDINGS: Normal cardiomediastinal silhouette without pneumothorax or pleural effusion. Patchy airspace opacities over the posterior lung bases on lateral view. IMPRESSION: 1. Suspect posterior lower lobe infiltrates on the lateral view. 2. Mild cardiomegaly Electronically Signed   By: Donavan Foil M.D.   On: 07/10/2019 22:53   Ct Pelvis Wo Contrast  Result Date: 07/10/2019 CLINICAL DATA:  Hip pain, chronic possible lytic lesion left sacral ala. EXAM: CT PELVIS WITHOUT CONTRAST TECHNIQUE: Multidetector CT imaging of the pelvis was performed following the standard protocol without intravenous contrast. COMPARISON:  Plain film study of the same date., bone biopsy images of 06/24/2017. FINDINGS: Urinary Tract:  Choose 1 Bowel: No signs of acute bowel process within visualized portions of the bowel. Vascular/Lymphatic: Not well evaluated given lack of intravenous contrast. Signs of calcific atherosclerotic change throughout the distal abdominal aorta and the iliac vasculature. Reproductive:  Calcified fibroid in the uterine fundus. Other:  No signs of ascites. Musculoskeletal: Signs of bony sclerosis about the left sacroiliac joint, now associated with more bony erosion and indistinct  margins than on the previous exam. Widening of the joint since the prior exam. Subtle stranding along the anterior margin of the joint becoming more pronounced inferiorly. The hips are located bilaterally. IMPRESSION: 1. Signs of bony sclerosis about the left sacroiliac joint, now associated with more bony erosion and indistinct margins than on the previous exam. Findings are concerning for septic arthritis versus active sacroiliitis. No signs of fracture. Small cortical 2. Signs of calcific atherosclerotic change throughout the distal abdominal aorta and iliac vasculature. 3. Calcified fibroid in the uterine fundus. 4. Aortic atherosclerosis. Aortic Atherosclerosis (ICD10-I70.0). Electronically Signed   By: Jewel Baize.D.  On: 07/10/2019 19:02   Dexascan  Result Date: 06/30/2019 EXAM: DUAL X-RAY ABSORPTIOMETRY (DXA) FOR BONE MINERAL DENSITY IMPRESSION: Referring Physician:  [name] Your patient completed a BMD test using Lunar IDXA DXA system ( analysis version: 16 ) manufactured by [employer]. Technologist: AW PATIENT: Name: Erika [name], Erika [name] Patient ID: [id number] Birth Date: 1941-06-07 Height:     65.0 in. Sex: Female Measured: 06/30/2019 Weight:     298.0 lbs. Indications: Advanced Age, Estrogen Deficient, Postmenopausal Fractures: None Treatments: None ASSESSMENT: The BMD measured at Femur Total Left is 0.848 g/cm2 with a T-score of -1.3. This patient is considered OSTEOPENIC according to [facility]) criteria. The scan quality is good. Site Region Measured Date Measured Age YA T-score BMD Significant CHANGE AP Spine  L1-L4      8/30[age]         -1.2    1.062 g/cm2 DualFemur Total Left 8/30/2019    [age]         -1.3    0.848 g/cm2 DualFemur Total Mean 8/30/2019    [age]         -1.2    0.852 g/cm2 World Health Organization [facility]) criteria for post-menopausal, Caucasian Women: Normal       T-score at or above -1 SD Osteopenia   T-score between -1 and -2.5 SD  Osteoporosis T-score at or below -2.5 SD RECOMMENDATION: 1. All patients should optimize calcium and vitamin D intake. 2. Consider FDA approved medical therapies in postmenopausal women and men aged 81 years and older, based on the following: a. A hip or vertebral (clinical or morphometric) fracture b. T- score < or = -2.5 at the femoral neck or spine after appropriate evaluation to exclude secondary causes c. Low bone mass (T-score between -1.0 and -2.5 at the femoral neck or spine) and a 10 year probability of a hip fracture > or = 3% or a 10 year probability of a major osteoporosis-related fracture > or = 20% based on the [place]-adapted WHO algorithm d. Clinician judgment and/or patient preferences may indicate treatment for people with 10-year fracture probabilities above or below these levels FOLLOW-UP: Patients with diagnosis of osteoporosis or at high risk for fracture should have regular bone mineral density tests. For patients eligible for Medicare, routine testing is allowed once every 2 years. The testing frequency can be increased to one year for patients who have rapidly progressing disease, those who are receiving or discontinuing medical therapy to restore bone mass, or have additional risk factors. I have reviewed this report and agree with the above findings. Mark A. Thornton Papas, M.D. [facility] FRAX* 10-year Probability of Fracture Based on femoral neck BMD: DualFemur (Right) Major Osteoporotic Fracture: 3.9% Hip Fracture:                0.6% Population:                  [place] (Black) Risk Factors:                None *FRAX is a Materials engineer of the [employer] for Metabolic Bone Disease, a World Pharmacologist (WHO) [facility]. ASSESSMENT: The probability of a major osteoporotic fracture is 3.9 % within the next ten years. The probability of a hip fracture is 0.6 % within the next ten years. I have reviewed this report and agree with the above findings.  Mark A. Thornton Papas, M.D. [facility] Electronically Signed   By:  Lavonia Dana M.D.   On: 06/30/2019 09:33   Mammogram Digital Screening  Result Date: 07/01/2019 CLINICAL DATA:  Screening. EXAM: DIGITAL SCREENING BILATERAL MAMMOGRAM WITH CAD COMPARISON:  Previous exam(s). ACR Breast Density Category b: There are scattered areas of fibroglandular density. FINDINGS: There are no findings suspicious for malignancy. Images were processed with CAD. IMPRESSION: No mammographic evidence of malignancy. A result letter of this screening mammogram will be mailed directly to the patient. RECOMMENDATION: Screening mammogram in one year. (Code:SM-B-01Y) BI-RADS CATEGORY  1: Negative. Electronically Signed   By: Lajean Manes M.D.   On: 07/01/2019 11:16   Dg Hip Unilat W Or Wo Pelvis 2-3 Views Left  Result Date: 07/10/2019 CLINICAL DATA:  Left-sided hip pain EXAM: DG HIP (WITH OR WITHOUT PELVIS) 2-3V LEFT COMPARISON:  CT 05/29/2017 FINDINGS: No fracture or malalignment. Mild joint space narrowing. Pubic symphysis and rami appear intact. Questionable lytic change involving the left sacral ala. IMPRESSION: 1. No acute osseous abnormality of left hip. Mild degenerative change 2. Questionable lytic process involving the left sacral ala. CT could be obtained for further evaluation. Electronically Signed   By: Donavan Foil M.D.   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  WBC counts relatively stable today.  Peripheral blood smear personally previously reviewed-shows significant neutrophilia with left shift.  Increased bands with a few myelocytes and metamyelocytes.  Pelgeroid neutrophils.  No significant increased blasts.  Monocytosis present.  Foundation One heme with Chek 2 mutation. No other findings  noted. JAK2 and BCR-ABL mutations neg.  06/24/17 BM bx- this showed hypercellular bone marrow with granulocytic proliferation. Cytogenetic analysis was normal.   #2 mild normocytic anemia. HGb at 9.1  #3  Mild thrombocytopenia at 129k  PLAN:  -Ms Forker has been lost to follow-up since March 2020 .  She attributes her lack of follow-up to COVID-19 related pandemic concerns . -Her WBC count has increased to 107 k and she is showing signs of tumor lysis syndrome some increase in her anemia and some development of thrombocytosis . -There is concern for possible progression of her MPN/MDS with CHEK2 deletion.  Unclear if the leukocytosis could be due to acute inflammation or infection of her left sacroiliac joint.  In keeping with this possibility she does have elevated CRP and sed rates. -She is been covered by antibiotics cefepime plus vancomycin to address possible pneumonia and left sacroiliitis. -Would recommend hospitalist consider possible CT-guided left SI joint aspiration for culture by IR if possible. -It is also possible that she has had progression of her MPN/MDS and less likely but to rule out leukemic transformation. -We will start the patient on low dose hydroxyurea 561m po daily -IVF per hospitalist -continue Allopurinol -- daily uric acid levels -CT guided bone marrow aspiration and biopsy ordered to evaluate for change in her MPN/MDS ? Leukemic transformation.    GSullivan LoneMD MPensacolaAAHIVMS SWisconsin Institute Of Surgical Excellence LLCCChillicothe HospitalHematology/Oncology Physician CNorth [facility]aks Medical Center (Office):       [phone](Work cell):  [phone](Fax):           [phone] 07/11/2019 5:27 PM

## 2019-07-12 ENCOUNTER — Inpatient Hospital Stay (HOSPITAL_COMMUNITY): Payer: PPO

## 2019-07-12 DIAGNOSIS — E790 Hyperuricemia without signs of inflammatory arthritis and tophaceous disease: Secondary | ICD-10-CM

## 2019-07-12 LAB — COMPREHENSIVE METABOLIC PANEL
ALT: 11 U/L (ref 0–44)
AST: 18 U/L (ref 15–41)
Albumin: 3.4 g/dL — ABNORMAL LOW (ref 3.5–5.0)
Alkaline Phosphatase: 274 U/L — ABNORMAL HIGH (ref 38–126)
Anion gap: 12 (ref 5–15)
BUN: 23 mg/dL (ref 8–23)
CO2: 21 mmol/L — ABNORMAL LOW (ref 22–32)
Calcium: 8.8 mg/dL — ABNORMAL LOW (ref 8.9–10.3)
Chloride: 102 mmol/L (ref 98–111)
Creatinine, Ser: 1.31 mg/dL — ABNORMAL HIGH (ref 0.44–1.00)
GFR calc Af Amer: 46 mL/min — ABNORMAL LOW (ref >60)
GFR calc non Af Amer: 39 mL/min — ABNORMAL LOW (ref >60)
Glucose, Bld: 76 mg/dL (ref 70–99)
Potassium: 4.2 mmol/L (ref 3.5–5.1)
Sodium: 135 mmol/L (ref 135–145)
Total Bilirubin: 0.7 mg/dL (ref 0.3–1.2)
Total Protein: 7.6 g/dL (ref 6.5–8.1)

## 2019-07-12 LAB — PROCALCITONIN: Procalcitonin: 0.89 ng/mL

## 2019-07-12 LAB — CBC WITH DIFFERENTIAL/PLATELET
Abs Immature Granulocytes: 7.59 10*3/uL — ABNORMAL HIGH (ref 0.00–0.07)
Basophils Absolute: 0.1 10*3/uL (ref 0.0–0.1)
Basophils Relative: 0 %
Eosinophils Absolute: 1 10*3/uL — ABNORMAL HIGH (ref 0.0–0.5)
Eosinophils Relative: 1 %
HCT: 28 % — ABNORMAL LOW (ref 36.0–46.0)
Hemoglobin: 8.7 g/dL — ABNORMAL LOW (ref 12.0–15.0)
Immature Granulocytes: 7 %
Lymphocytes Relative: 3 %
Lymphs Abs: 3.1 10*3/uL (ref 0.7–4.0)
MCH: 30.9 pg (ref 26.0–34.0)
MCHC: 31.1 g/dL (ref 30.0–36.0)
MCV: 99.3 fL (ref 80.0–100.0)
Monocytes Absolute: 3.1 10*3/uL — ABNORMAL HIGH (ref 0.1–1.0)
Monocytes Relative: 3 %
Neutro Abs: 90.5 10*3/uL — ABNORMAL HIGH (ref 1.7–7.7)
Neutrophils Relative %: 86 %
Platelets: 139 10*3/uL — ABNORMAL LOW (ref 150–400)
RBC: 2.82 MIL/uL — ABNORMAL LOW (ref 3.87–5.11)
RDW: 15.3 % (ref 11.5–15.5)
WBC: 105.4 10*3/uL (ref 4.0–10.5)
nRBC: 0 % (ref 0.0–0.2)

## 2019-07-12 LAB — IRON AND TIBC
Iron: 46 ug/dL (ref 28–170)
Saturation Ratios: 14 % (ref 10.4–31.8)
TIBC: 339 ug/dL (ref 250–450)
UIBC: 293 ug/dL

## 2019-07-12 LAB — VITAMIN B12: Vitamin B-12: 448 pg/mL (ref 180–914)

## 2019-07-12 LAB — FERRITIN: Ferritin: 152 ng/mL (ref 11–307)

## 2019-07-12 LAB — MAGNESIUM: Magnesium: 2 mg/dL (ref 1.7–2.4)

## 2019-07-12 LAB — CALCIUM, IONIZED: Calcium, Ionized, Serum: 4.9 mg/dL (ref 4.5–5.6)

## 2019-07-12 LAB — RETICULOCYTES
Immature Retic Fract: 22.5 % — ABNORMAL HIGH (ref 2.3–15.9)
RBC.: 2.82 MIL/uL — ABNORMAL LOW (ref 3.87–5.11)
Retic Count, Absolute: 47.1 10*3/uL (ref 19.0–186.0)
Retic Ct Pct: 1.7 % (ref 0.4–3.1)

## 2019-07-12 LAB — IMMATURE PLATELET FRACTION: Immature Platelet Fraction: 17.5 % — ABNORMAL HIGH (ref 1.2–8.6)

## 2019-07-12 LAB — URIC ACID: Uric Acid, Serum: 11.3 mg/dL — ABNORMAL HIGH (ref 2.5–7.1)

## 2019-07-12 MED ORDER — HYDROCODONE-ACETAMINOPHEN 7.5-325 MG PO TABS
1.0000 | ORAL_TABLET | Freq: Four times a day (QID) | ORAL | Status: DC | PRN
  Administered 2019-07-13 – 2019-07-22 (×14): 1 via ORAL
  Filled 2019-07-12 (×16): qty 1

## 2019-07-12 MED ORDER — SODIUM CHLORIDE 0.9 % IV SOLN
INTRAVENOUS | Status: DC
  Administered 2019-07-12: 16:00:00 via INTRAVENOUS

## 2019-07-12 NOTE — Progress Notes (Addendum)
Referring Physician(s): Ashley  Supervising Physician: Sandi Mariscal  Patient Status:  [facility] - In-pt  Chief Complaint:  Back /hip pain, LE edema  Subjective: Pt familiar to IR service from right anterior chest wall cystic mass aspiration in 2001 and bone marrow biopsy in 2018.  She has a known history of hypertension, asthma, paroxysmal atrial fibrillation on Eliquis, and MPN with CHEK 2 deletion.  She now presents with disease progression along with increasing leukocytosis, new anemia and thrombocytopenia.  CT-guided bone marrow biopsy ordered by oncology to rule out transformation to AML.  Imaging also revealed signs of bony sclerosis about the left iliac joint with findings concerning for septic arthritis versus active sacroiliitis.  Oncology has also recommended left sacroiliac joint aspiration but no official order has been received yet and not  recommended by ortho unless pt febrile.  Patient currently denies fever, headache, chest pain, dyspnea, cough, abdominal pain, nausea, vomiting or bleeding.  Additional history as below.  Past Medical History:  Diagnosis Date   Anemia    with menses   Asthma    Back pain    status post surgery 2002   Breast cyst    Excesion with FNA, begnin in 2004.    Degenerative joint disease of spine    Imaging 2005,  Degenerative hypertrophic facet arthritis changes L4-5 and L5-S1.[name]    History of shingles    Recurrent with post herpetic neuralgia.    Hypertension    Lymphadenopathy    Of the mediastinum, Right side CXR 2008, not read on 2010 cxr.    Menopause    Obesity    BMI 54   Psychosis ([place])    Secondary to prednisone.   Shingles    Stasis dermatitis    W/ LE edema, prviously on lasix now on mazxide.    SVT (supraventricular tachycardia) ([place]) June 2009   one run while hospitalized   Tuberculosis    active TB treated in 2002, hx of paraspinal lumbar TB,    Past Surgical History:  Procedure Laterality Date   BREAST  CYST ASPIRATION Left    Breast cyst biopsy     CHOLECYSTECTOMY  02/02/2012   Procedure: LAPAROSCOPIC CHOLECYSTECTOMY;  Surgeon: Zenovia Jarred, MD;  Location: [place];  Service: General;  Laterality: N/A;   Hemilaminectomy of L4, L5 and S1 decompression of tumor in epidural space  2000      Allergies: Prednisone  Medications: Prior to Admission medications   Medication Sig Start Date End Date Taking? Authorizing Provider  ADVAIR DISKUS 500-50 MCG/DOSE AEPB INHALE 1 PUFF BY MOUTH TWICE DAILY Patient taking differently: Inhale 1 puff into the lungs 2 (two) times daily.  03/18/19  Yes [name], MD  albuterol (PROVENTIL HFA;VENTOLIN HFA) 108 (90 Base) MCG/ACT inhaler INHALE 1 TO 2 PUFFS BY MOUTH EVERY 6 HOURS AS NEEDED Patient taking differently: Inhale 1-2 puffs into the lungs every 6 (six) hours as needed for wheezing or shortness of breath.  10/30/18  Yes Brunetta Genera, MD  amLODipine (NORVASC) 10 MG tablet TAKE 1 TABLET BY MOUTH EVERY DAY Patient taking differently: Take 10 mg by mouth daily.  05/05/18  Yes [name], MD  apixaban (ELIQUIS) 5 MG TABS tablet Take 1 tablet (5 mg total) by mouth 2 (two) times daily. 12/10/18  Yes [name], MD  aspirin EC 81 MG tablet Take 81 mg by mouth daily.   Yes [provider]  diphenhydrAMINE (BENADRYL) 25 MG tablet Take  25 mg by mouth as needed. Once a day prn   Yes [provider]  fluticasone (FLONASE) 50 MCG/ACT nasal spray SHAKE LIQUID AND USE 1 SPRAY IN EACH NOSTRIL DAILY Patient taking differently: Place 1 spray into both nostrils daily.  04/16/19  Yes [name], MD  furosemide (LASIX) 20 MG tablet TAKE 1 TABLET(20 MG) BY MOUTH DAILY AS NEEDED FOR EDEMA Patient taking differently: Take 20 mg by mouth daily as needed for edema.  04/14/19  Yes [name], MD  metoprolol tartrate (LOPRESSOR) 25 MG tablet TAKE 1/2 TABLET(12.5 MG) BY MOUTH TWICE DAILY Patient taking differently: Take 12.5 mg by mouth  2 (two) times daily.  04/06/19  Yes [name], MD  omeprazole (PRILOSEC) 20 MG capsule TAKE 1 CAPSULE BY MOUTH EVERY DAY Patient taking differently: Take 20 mg by mouth daily.  09/29/18  Yes [name], MD  potassium chloride SA (K-DUR,KLOR-CON) 20 MEQ tablet Take 0.5 tablets (10 mEq total) by mouth daily. 04/28/18  Yes [name], MD  pravastatin (PRAVACHOL) 20 MG tablet Take 1 tablet (20 mg total) by mouth daily. 04/15/19  Yes [name], MD  traMADol (ULTRAM) 50 MG tablet TAKE 1 TABLET(50 MG) BY MOUTH EVERY 12 HOURS AS NEEDED Patient taking differently: Take 50 mg by mouth every 12 (twelve) hours as needed for moderate pain.  12/19/18  Yes [name], MD  fexofenadine [facility] ALLERGY) 180 MG tablet Take 1 tablet (180 mg total) by mouth daily. Patient not taking: Reported on 03/20/2019 10/31/17   Lorella Nimrod, MD  lisinopril (ZESTRIL) 5 MG tablet TAKE 1 TABLET BY MOUTH EVERY DAY Patient not taking: Reported on 03/20/2019 03/05/19   [name], MD  sodium chloride (OCEAN) 0.65 % SOLN nasal spray Place 1 spray into both nostrils as needed for congestion. Patient not taking: Reported on 03/20/2019 10/31/17   Lorella Nimrod, MD     Vital Signs: BP (!) 157/77 (BP Location: Left Arm)    Pulse 100    Temp 98.4 F (36.9 C) (Oral)    Resp 16    Ht 5' 6"  (1.676 m)    Wt 297 lb 13.5 oz (135.1 kg)    SpO2 93%    BMI 48.07 kg/m   Physical Exam awake, alert.  Chest with slightly diminished breath sounds at bases.  Heart with regular rate and rhythm.  Abdomen soft, positive bowel sounds, nontender.  Bilateral lower extremity edema noted  Imaging: Chest Xray 2 View  Result Date: 07/10/2019 CLINICAL DATA:  Wheezing EXAM: CHEST - 2 VIEW COMPARISON:  05/29/2017 FINDINGS: Normal cardiomediastinal silhouette without pneumothorax or pleural effusion. Patchy airspace opacities over the posterior lung bases on lateral view. IMPRESSION: 1. Suspect posterior lower lobe infiltrates on the  lateral view. 2. Mild cardiomegaly Electronically Signed   By: Donavan Foil M.D.   On: 07/10/2019 22:53   Ct Pelvis Wo Contrast  Result Date: 07/10/2019 CLINICAL DATA:  Hip pain, chronic possible lytic lesion left sacral ala. EXAM: CT PELVIS WITHOUT CONTRAST TECHNIQUE: Multidetector CT imaging of the pelvis was performed following the standard protocol without intravenous contrast. COMPARISON:  Plain film study of the same date., bone biopsy images of 06/24/2017. FINDINGS: Urinary Tract:  Choose 1 Bowel: No signs of acute bowel process within visualized portions of the bowel. Vascular/Lymphatic: Not well evaluated given lack of intravenous contrast. Signs of calcific atherosclerotic change throughout the distal abdominal aorta and the iliac vasculature. Reproductive:  Calcified fibroid in the  uterine fundus. Other:  No signs of ascites. Musculoskeletal: Signs of bony sclerosis about the left sacroiliac joint, now associated with more bony erosion and indistinct margins than on the previous exam. Widening of the joint since the prior exam. Subtle stranding along the anterior margin of the joint becoming more pronounced inferiorly. The hips are located bilaterally. IMPRESSION: 1. Signs of bony sclerosis about the left sacroiliac joint, now associated with more bony erosion and indistinct margins than on the previous exam. Findings are concerning for septic arthritis versus active sacroiliitis. No signs of fracture. Small cortical 2. Signs of calcific atherosclerotic change throughout the distal abdominal aorta and iliac vasculature. 3. Calcified fibroid in the uterine fundus. 4. Aortic atherosclerosis. Aortic Atherosclerosis (ICD10-I70.0). Electronically Signed   By: Zetta Bills M.D.   On: 07/10/2019 19:02   Dg Hip Unilat W Or Wo Pelvis 2-3 Views Left  Result Date: 07/10/2019 CLINICAL DATA:  Left-sided hip pain EXAM: DG HIP (WITH OR WITHOUT PELVIS) 2-3V LEFT COMPARISON:  CT 05/29/2017 FINDINGS: No fracture  or malalignment. Mild joint space narrowing. Pubic symphysis and rami appear intact. Questionable lytic change involving the left sacral ala. IMPRESSION: 1. No acute osseous abnormality of left hip. Mild degenerative change 2. Questionable lytic process involving the left sacral ala. CT could be obtained for further evaluation. Electronically Signed   By: Donavan Foil M.D.   On: 07/10/2019 17:34    Labs:  CBC: Recent Labs    10/07/18 1121 07/10/19 1735 07/11/19 0500 07/12/19 0129  WBC 61.3* 107.4* 92.2* 105.4*  HGB 9.7* 10.0* 9.1* 8.7*  HCT 31.1* 30.9* 29.7* 28.0*  PLT 153 145* 129* 139*    COAGS: Recent Labs    07/10/19 1735  INR 1.3*  APTT 48*    BMP: Recent Labs    10/07/18 1121 07/10/19 1735 07/12/19 0129  NA 140 135 135  K 4.1 4.4 4.2  CL 106 96* 102  CO2 27 23 21*  GLUCOSE 84 93 76  BUN 8 26* 23  CALCIUM 8.6* 9.4 8.8*  CREATININE 0.95 1.49* 1.31*  GFRNONAA 59* 34* 39*  GFRAA >60 39* 46*    LIVER FUNCTION TESTS: Recent Labs    10/07/18 1121 07/10/19 1735 07/11/19 1455 07/12/19 0129  BILITOT 0.4 0.4  --  0.7  AST 13* 19  --  18  ALT 8 15  --  11  ALKPHOS 208* 331*  --  274*  PROT 7.6 8.3*  --  7.6  ALBUMIN 3.1* 3.7 3.1* 3.4*    Assessment and Plan:  Pt with known history of hypertension, asthma, paroxysmal atrial fibrillation on Eliquis, and MPN with CHEK 2 deletion.  She now presents with disease progression along with increasing leukocytosis, new anemia and thrombocytopenia.  CT-guided bone marrow biopsy ordered by oncology to rule out transformation to AML.  Imaging also revealed signs of bony sclerosis about the left iliac joint with findings concerning for septic arthritis versus active sacroiliitis.  Oncology has also recommended left sacroiliac joint aspiration but no official order has been received yet and not recommended by ortho unless pt febrile.  Last dose of Eliquis was 10 AM today.  Details/risks of above procedures, including but not  limited to, internal bleeding, infection, injury to adjacent structures discussed with patient with her understanding and consent.  We will tentatively plan procedure for 12/7 if schedule allows/pending radiologist review.   Electronically Signed: D. Rowe Robert, PA-C 07/12/2019, 2:19 PM   I spent a total of 20 minutes at  the the patient's bedside AND on the patient's hospital floor or unit, greater than 50% of which was counseling/coordinating care for CT-guided bone marrow biopsy and possible left sacroiliac joint aspiration.    Patient ID: Erika [name], female   DOB: 24-Jan-1941, 78 y.o.   MRN: [id number]

## 2019-07-12 NOTE — Progress Notes (Signed)
[name]   HEMATOLOGY/ONCOLOGY INPATIENT PROGRESS NOTE  Date of Service: 07/12/2019  Inpatient Attending: .Bonnell Public, MD   SUBJECTIVE  Patient notes her back pain is somewhat improved but she is still bedbound at this time.  Trace pedal edema.  No issues with tolerating her first 1 or 2 doses of hydroxyurea. Still with significant leukocytosis.  Uric acid levels starting to trend downwards.  Creatinine improving.  Scheduled for bone marrow biopsy tomorrow.  Anticoagulation will be held as per IR preference.   OBJECTIVE:  NAD  PHYSICAL EXAMINATION: . Vitals:   07/11/19 2022 07/11/19 2159 07/12/19 0533 07/12/19 0813  BP:  129/80 (!) 157/77   Pulse:  98 100   Resp:  16 16   Temp:  98.4 F (36.9 C) 98.4 F (36.9 C)   TempSrc:  Oral Oral   SpO2: 94% 97% 94% 93%  Weight:      Height:       Filed Weights   07/10/19 1639 07/11/19 1119  Weight: 297 lb (134.7 kg) 297 lb 13.5 oz (135.1 kg)   .Body mass index is 48.07 kg/m.  GENERAL:alert, in no acute distress and comfortable LYMPH:  no palpable lymphadenopathy in the cervical, axillary or inguinal LUNGS: clear to auscultation with normal respiratory effort HEART: regular rate & rhythm,  no murmurs and no lower extremity edema ABDOMEN: abdomen soft, non-tender, normoactive bowel sounds  Musculoskeletal: Trace pedal edema PSYCH: alert & oriented x 3 with fluent speech NEURO: no focal motor/sensory deficits  MEDICAL HISTORY:  Past Medical History:  Diagnosis Date   Anemia    with menses   Asthma    Back pain    status post surgery 2002   Breast cyst    Excesion with FNA, begnin in 2004.    Degenerative joint disease of spine    Imaging 2005,  Degenerative hypertrophic facet arthritis changes L4-5 and L5-S1.[name]    History of shingles    Recurrent with post herpetic neuralgia.    Hypertension    Lymphadenopathy    Of the mediastinum, Right side CXR 2008, not read on 2010 cxr.    Menopause    Obesity    BMI 54   Psychosis ([place])    Secondary to prednisone.   Shingles    Stasis dermatitis    W/ LE edema, prviously on lasix now on mazxide.    SVT (supraventricular tachycardia) ([place]) June 2009   one run while hospitalized   Tuberculosis    active TB treated in 2002, hx of paraspinal lumbar TB,     SURGICAL HISTORY: Past Surgical History:  Procedure Laterality Date   BREAST CYST ASPIRATION Left    Breast cyst biopsy     CHOLECYSTECTOMY  02/02/2012   Procedure: LAPAROSCOPIC CHOLECYSTECTOMY;  Surgeon: Zenovia Jarred, MD;  Location: [place];  Service: General;  Laterality: N/A;   Hemilaminectomy of L4, L5 and S1 decompression of tumor in epidural space  2000    SOCIAL HISTORY: Social History   Socioeconomic History   Marital status: Widowed    Spouse name: Not on file   Number of children: Not on file   Years of education: Not on file   Highest education level: Not on file  Occupational History   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   Occupation: volunteerd at [place]: Not on file   Food insecurity    Worry: Not on file    Inability: Not  on file   Transportation needs    Medical: Not on file    Non-medical: Not on file  Tobacco Use   Smoking status: Never Smoker   Smokeless tobacco: Never Used  Substance and Sexual Activity   Alcohol use: No    Alcohol/week: 0.0 standard drinks   Drug use: No   Sexual activity: Not Currently    Birth control/protection: Post-menopausal  Lifestyle   Physical activity    Days per week: Not on file    Minutes per session: Not on file   Stress: Not on file  Relationships   Social connections    Talks on phone: Not on file    Gets together: Not on file    Attends religious service: Not on file    Active member of club or organization: Not on file    Attends meetings of clubs or organizations: Not on file    Relationship status: Not on file    Intimate partner violence    Fear of current or ex partner: Not on file    Emotionally abused: Not on file    Physically abused: Not on file    Forced sexual activity: Not on file  Other Topics Concern   Not on file  Social History Narrative   Current Social History 03/20/2019        Patient lives alone in a ground floor apartment which is 1 story. There are not steps up to the entrance the patient uses. One son stays with her once in a while.      Patient's method of transportation is personal car.      The highest level of education was some high school: 11th grade.      The patient currently retired.      Identified important Relationships are "My friend Mart Piggs, my nieces, my grandkids, a lot of people.       Pets : None       Interests / Fun: "Playing word games on my phone, watch TV, Talking on phone."       Current Stressors: "Nothing, really. I try not to let anything stress me."       Religious / Personal Beliefs: "I believe in God."       L. Ducatte, RN, BSN     FAMILY HISTORY: Family History  Problem Relation Age of Onset   Stroke Mother        at young age   Heart disease Mother    Heart attack Father    Heart disease Father    Cancer Sister        Unknown   Stroke Brother    Stroke Brother    Breast cancer Other    Sickle cell anemia Brother    Diabetes Brother    Stroke Brother     ALLERGIES:  is allergic to prednisone.  MEDICATIONS:  Scheduled Meds:  allopurinol  150 mg Oral BID   amLODipine  10 mg Oral Daily   apixaban  5 mg Oral BID   hydroxyurea  500 mg Oral BID WC   metoprolol tartrate  12.5 mg Oral BID   mometasone-formoterol  2 puff Inhalation BID   pantoprazole  40 mg Oral Daily   pravastatin  20 mg Oral Daily   Continuous Infusions:  sodium chloride 100 mL/hr at 07/12/19 0718   cefTRIAXone (ROCEPHIN)  IV Stopped (07/11/19 1648)   vancomycin 1,500 mg (07/12/19 0952)   PRN Meds:.albuterol,  traMADol  REVIEW OF  SYSTEMS:    10 Point review of Systems was done is negative except as noted above.   LABORATORY DATA:  I have reviewed the data as listed  . CBC Latest Ref Rng & Units 07/12/2019 07/11/2019 07/10/2019  WBC 4.0 - 10.5 K/uL 105.4(HH) 92.2(HH) 107.4(HH)  Hemoglobin 12.0 - 15.0 g/dL 8.7(L) 9.1(L) 10.0(L)  Hematocrit 36.0 - 46.0 % 28.0(L) 29.7(L) 30.9(L)  Platelets 150 - 400 K/uL 139(L) 129(L) 145(L)    CMP Latest Ref Rng & Units 07/12/2019 07/10/2019 10/07/2018  Glucose 70 - 99 mg/dL 76 93 84  BUN 8 - 23 mg/dL 23 26(H) 8  Creatinine 0.44 - 1.00 mg/dL 1.31(H) 1.49(H) 0.95  Sodium 135 - 145 mmol/L 135 135 140  Potassium 3.5 - 5.1 mmol/L 4.2 4.4 4.1  Chloride 98 - 111 mmol/L 102 96(L) 106  CO2 22 - 32 mmol/L 21(L) 23 27  Calcium 8.9 - 10.3 mg/dL 8.8(L) 9.4 8.6(L)  Total Protein 6.5 - 8.1 g/dL 7.6 8.3(H) 7.6  Total Bilirubin 0.3 - 1.2 mg/dL 0.7 0.4 0.4  Alkaline Phos 38 - 126 U/L 274(H) 331(H) 208(H)  AST 15 - 41 U/L 18 19 13(L)  ALT 0 - 44 U/L 11 15 8    Component     Latest Ref Rng & Units 07/10/2019 07/12/2019  Iron     28 - 170 ug/dL  46  TIBC     250 - 450 ug/dL  339  Saturation Ratios     10.4 - 31.8 %  14  UIBC     ug/dL  293  Retic Ct Pct     0.4 - 3.1 %  1.7  RBC.     3.87 - 5.11 MIL/uL  2.82 (L)  Retic Count, Absolute     19.0 - 186.0 K/uL  47.1  Immature Retic Fract     2.3 - 15.9 %  22.5 (H)  Uric Acid, Serum     2.5 - 7.1 mg/dL 14.1 (H) 11.3 (H)  Ferritin     11 - 307 ng/mL  152  Vitamin B12     180 - 914 pg/mL  448  Immature Platelet Fraction     1.2 - 8.6 %  17.5 (H)    RADIOGRAPHIC STUDIES: I have personally reviewed the radiological images as listed and agreed with the findings in the report. Chest Xray 2 View  Result Date: 07/10/2019 CLINICAL DATA:  Wheezing EXAM: CHEST - 2 VIEW COMPARISON:  05/29/2017 FINDINGS: Normal cardiomediastinal silhouette without pneumothorax or pleural effusion. Patchy airspace opacities over the posterior  lung bases on lateral view. IMPRESSION: 1. Suspect posterior lower lobe infiltrates on the lateral view. 2. Mild cardiomegaly Electronically Signed   By: Donavan Foil M.D.   On: 07/10/2019 22:53   Ct Pelvis Wo Contrast  Result Date: 07/10/2019 CLINICAL DATA:  Hip pain, chronic possible lytic lesion left sacral ala. EXAM: CT PELVIS WITHOUT CONTRAST TECHNIQUE: Multidetector CT imaging of the pelvis was performed following the standard protocol without intravenous contrast. COMPARISON:  Plain film study of the same date., bone biopsy images of 06/24/2017. FINDINGS: Urinary Tract:  Choose 1 Bowel: No signs of acute bowel process within visualized portions of the bowel. Vascular/Lymphatic: Not well evaluated given lack of intravenous contrast. Signs of calcific atherosclerotic change throughout the distal abdominal aorta and the iliac vasculature. Reproductive:  Calcified fibroid in the uterine fundus. Other:  No signs of ascites. Musculoskeletal: Signs of bony sclerosis about the left sacroiliac joint, now associated with more bony erosion  and indistinct margins than on the previous exam. Widening of the joint since the prior exam. Subtle stranding along the anterior margin of the joint becoming more pronounced inferiorly. The hips are located bilaterally. IMPRESSION: 1. Signs of bony sclerosis about the left sacroiliac joint, now associated with more bony erosion and indistinct margins than on the previous exam. Findings are concerning for septic arthritis versus active sacroiliitis. No signs of fracture. Small cortical 2. Signs of calcific atherosclerotic change throughout the distal abdominal aorta and iliac vasculature. 3. Calcified fibroid in the uterine fundus. 4. Aortic atherosclerosis. Aortic Atherosclerosis (ICD10-I70.0). Electronically Signed   By: Zetta Bills M.D.   On: 07/10/2019 19:02   Dexascan  Result Date: 06/30/2019 EXAM: DUAL X-RAY ABSORPTIOMETRY (DXA) FOR BONE MINERAL DENSITY IMPRESSION:  Referring Physician:  [name] Your patient completed a BMD test using Lunar IDXA DXA system ( analysis version: 16 ) manufactured by [employer]. Technologist: AW PATIENT: Name: Aleiya, Rye Patient ID: [id number] Birth Date: 03-28-1941 Height:     65.0 in. Sex: Female Measured: 06/30/2019 Weight:     298.0 lbs. Indications: Advanced Age, Estrogen Deficient, Postmenopausal Fractures: None Treatments: None ASSESSMENT: The BMD measured at Femur Total Left is 0.848 g/cm2 with a T-score of -1.3. This patient is considered OSTEOPENIC according to [facility]) criteria. The scan quality is good. Site Region Measured Date Measured Age YA T-score BMD Significant CHANGE AP Spine  L1-L4      8/30[age]         -1.0    1.062 g/cm2 DualFemur Total Left 8/30/2019    [age]         -1.3    0.848 g/cm2 DualFemur Total Mean 8/30/2019    [age]         -1.2    0.852 g/cm2 World Health Organization [facility]) criteria for post-menopausal, Caucasian Women: Normal       T-score at or above -1 SD Osteopenia   T-score between -1 and -2.5 SD Osteoporosis T-score at or below -2.5 SD RECOMMENDATION: 1. All patients should optimize calcium and vitamin D intake. 2. Consider FDA approved medical therapies in postmenopausal women and men aged 46 years and older, based on the following: a. A hip or vertebral (clinical or morphometric) fracture b. T- score < or = -2.5 at the femoral neck or spine after appropriate evaluation to exclude secondary causes c. Low bone mass (T-score between -1.0 and -2.5 at the femoral neck or spine) and a 10 year probability of a hip fracture > or = 3% or a 10 year probability of a major osteoporosis-related fracture > or = 20% based on the [place]-adapted WHO algorithm d. Clinician judgment and/or patient preferences may indicate treatment for people with 10-year fracture probabilities above or below these levels FOLLOW-UP: Patients with diagnosis of osteoporosis or at high risk for  fracture should have regular bone mineral density tests. For patients eligible for Medicare, routine testing is allowed once every 2 years. The testing frequency can be increased to one year for patients who have rapidly progressing disease, those who are receiving or discontinuing medical therapy to restore bone mass, or have additional risk factors. I have reviewed this report and agree with the above findings. Mark A. Thornton Papas, M.D. [facility] FRAX* 10-year Probability of Fracture Based on femoral neck BMD: DualFemur (Right) Major Osteoporotic Fracture: 3.9% Hip Fracture:                0.6% Population:  [place] (Black) Risk Factors:                None *FRAX is a Materials engineer of the [employer] for Metabolic Bone Disease, a [place] (WHO) [facility]. ASSESSMENT: The probability of a major osteoporotic fracture is 3.9 % within the next ten years. The probability of a hip fracture is 0.6 % within the next ten years. I have reviewed this report and agree with the above findings. Mark A. Thornton Papas, M.D. [facility] Electronically Signed   By: Lavonia Dana M.D.   On: 06/30/2019 09:33   Mammogram Digital Screening  Result Date: 07/01/2019 CLINICAL DATA:  Screening. EXAM: DIGITAL SCREENING BILATERAL MAMMOGRAM WITH CAD COMPARISON:  Previous exam(s). ACR Breast Density Category b: There are scattered areas of fibroglandular density. FINDINGS: There are no findings suspicious for malignancy. Images were processed with CAD. IMPRESSION: No mammographic evidence of malignancy. A result letter of this screening mammogram will be mailed directly to the patient. RECOMMENDATION: Screening mammogram in one year. (Code:[id number]-B-01Y) BI-RADS CATEGORY  1: Negative. Electronically Signed   By: Lajean Manes M.D.   On: 07/01/2019 11:16   Dg Hip Unilat W Or Wo Pelvis 2-3 Views Left  Result Date: 07/10/2019 CLINICAL DATA:  Left-sided hip pain EXAM:  DG HIP (WITH OR WITHOUT PELVIS) 2-3V LEFT COMPARISON:  CT 05/29/2017 FINDINGS: No fracture or malalignment. Mild joint space narrowing. Pubic symphysis and rami appear intact. Questionable lytic change involving the left sacral ala. IMPRESSION: 1. No acute osseous abnormality of left hip. Mild degenerative change 2. Questionable lytic process involving the left sacral ala. CT could be obtained for further evaluation. Electronically Signed   By: Donavan Foil M.D.   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  Foundation One heme with Chek 2 mutation. No other findings noted. JAK2 and BCR-ABL mutations neg.  06/24/17 BM bx- this showed hypercellular bone marrow with granulocytic proliferation. Cytogenetic analysis was normal.   #2 mild normocytic anemia. HGb at  8.7k  #3  Mild thrombocytopenia at 129k---> 139k  #4 left Sacroiliac joint inflammation versus septic arthritis. #5 possible pneumonia  PLAN: -Continue patient on low dose hydroxyurea 584m po twice daily with meals.  No symptomatology suggestive of leukostasis.  In the setting of possible infection will hold off on ramping up hydroxyurea dose significantly. --IVF per hospitalist -continue Allopurinol -- daily uric acid levels uric acid levels coming down to 11.3. -Daily CBC CMP and uric acid -CT guided bone marrow aspiration and biopsy ordered to evaluate for change in her MPN/MDS ? Leukemic transformation.  Scheduled for tomorrow.  Anticoagulation held as per IR input for bone marrow biopsy. -If possible would recommend CT-guided aspiration of left sacroiliac joint to rule out infection/inflammation to determine the etiology of her left sacroiliitis.  If infection is ruled out could consider left SI joint steroid injection for  pain control. -On antibiotics to cover possible pneumonia and possible left sacroiliac joint infection.  -Transfuse as needed for hemoglobin less than 8 or if symptomatic.  Given elevated white counts will try to avoid excessive transfusions due to low but possible risk of hyperviscosity.   I spent 25 minutes counseling the patient face to face. The total time spent in the appointment was 35 minutes and  more than 50% was on counseling and direct patient cares.    Sullivan Lone MD [place] AAHIVMS [facility] Hematology/Oncology Physician [facility]  (Office):       [phone] (Work cell):  [phone] (Fax):           [phone]  07/12/2019 1:54 PM

## 2019-07-12 NOTE — Progress Notes (Addendum)
Dr. Vernard Gambles with Interventional radiology called by Anderson Malta RN to verify that Eliquis would need to be held prior to Bone marrow biospy in am. Rn relayed this information to Dr. Marthenia Rolling who agreed with the hold of pt blood thinner. Rn will report this finding to oncoming RN tonight.

## 2019-07-12 NOTE — Consult Note (Signed)
Reason for Consult:LBP  , question septic left sacroiliac joint Referring Physician: Dr. [name] is an 78 y.o. female.  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  She has had problems with low back pain for several years.  CT scan of the pelvis on showed sclerosis at the left SI joint slightly increased from previous imaging which was 06/24/2017 at the time of a CT-guided bone marrow biopsy.  Patient also had an MRI scan 05/29/2017 that showed inflammation at the left SI joint but was not consistent with pyarthrosis.  Patient's myeloproliferative disorder makes white blood count not helpful 107,000 and it has been elevated from 30s to 60s over the last year.  She does have elevated uric which is now 14 but has been running 6 to 11 range.  No history of podagra she occasionally has some knee pain but really has not had any classic gout type attacks.  Previous laminectomy decompression surgery for tuberculosis of the spine and this was 2002.  C-reactive protein was 1.9 only mildly elevated.  Sedimentation rate was 72 days ago but has been running in the 60s for the last few years.  Patient denies chills or fever she describes midline lumbar pain that radiates into the buttocks.  Past Medical History:  Diagnosis Date   Anemia    with menses   Asthma    Back pain    status post surgery 2002   Breast cyst    Excesion with FNA, begnin in 2004.    Degenerative joint disease of spine    Imaging 2005,  Degenerative hypertrophic facet arthritis changes L4-5 and L5-S1.[name]    History of shingles    Recurrent with post herpetic neuralgia.    Hypertension    Lymphadenopathy    Of the mediastinum, Right side CXR 2008, not read on 2010 cxr.    Menopause    Obesity    BMI 54   Psychosis ([place])    Secondary to prednisone.   Shingles    Stasis  dermatitis    W/ LE edema, prviously on lasix now on mazxide.    SVT (supraventricular tachycardia) ([place]) June 2009   one run while hospitalized   Tuberculosis    active TB treated in 2002, hx of paraspinal lumbar TB,     Past Surgical History:  Procedure Laterality Date   BREAST CYST ASPIRATION Left    Breast cyst biopsy     CHOLECYSTECTOMY  02/02/2012   Procedure: LAPAROSCOPIC CHOLECYSTECTOMY;  Surgeon: Zenovia Jarred, MD;  Location: [facility];  Service: General;  Laterality: N/A;   Hemilaminectomy of L4, L5 and S1 decompression of tumor in epidural space  2000    Family History  Problem Relation Age of Onset   Stroke Mother        at young age   Heart disease Mother    Heart attack Father    Heart disease Father    Cancer Sister        Unknown   Stroke Brother    Stroke Brother    Breast cancer Other    Sickle cell anemia Brother    Diabetes Brother    Stroke Brother     Social History:  reports that she has never smoked. She has never used smokeless tobacco. She reports that she does not drink alcohol or use drugs.  Allergies:  Allergies  Allergen Reactions   Prednisone Other (See Comments)    REACTION: Psychosis, talking out of head, insomnia    Medications: I have reviewed the patient's current medications.  Results for orders placed or performed during the hospital encounter of 07/10/19 (from the past 48 hour(s))  CBC with Differential     Status: Abnormal   Collection Time: 07/10/19  5:35 PM  Result Value Ref Range   WBC 107.4 (HH) 4.0 - 10.5 K/uL    Comment: REPEATED TO VERIFY WHITE COUNT CONFIRMED ON SMEAR THIS CRITICAL RESULT HAS VERIFIED AND BEEN CALLED TO M.BRILL BY NATHAN THOMPSON ON 12 04 2020 AT 1845, AND HAS BEEN READ BACK. CRITICAL RESULT VERIFIED    RBC 3.13 (L) 3.87 - 5.11 MIL/uL   Hemoglobin 10.0 (L) 12.0 - 15.0 g/dL   HCT 30.9 (L) 36.0 - 46.0 %   MCV 98.7 80.0 - 100.0 fL   MCH 31.9 26.0 - 34.0 pg   MCHC 32.4 30.0 - 36.0  g/dL   RDW 15.4 11.5 - 15.5 %   Platelets 145 (L) 150 - 400 K/uL   nRBC 0.0 0.0 - 0.2 %   Neutrophils Relative % 66 %   Lymphocytes Relative 2 %   Monocytes Relative 2 %   Eosinophils Relative 1 %   Basophils Relative 0 %   Band Neutrophils 21 %   Metamyelocytes Relative 1 %   Myelocytes 6 %   Promyelocytes Relative 1 %   Blasts 0 %   nRBC 0 0 /100 WBC   Other 0 %   Neutro Abs 93.5 (H) 1.7 - 7.7 K/uL   Lymphs Abs 2.1 0.7 - 4.0 K/uL   Monocytes Absolute 2.1 (H) 0.1 - 1.0 K/uL   Eosinophils Absolute 1.1 (H) 0.0 - 0.5 K/uL   Basophils Absolute 0.0 0.0 - 0.1 K/uL   Abs Immature Granulocytes 8.59 (H) 0.00 - 0.07 K/uL   RBC Morphology ELLIPTOCYTES    WBC Morphology INCREASED BANDS (>20% BANDS)     Comment: MODERATE LEFT SHIFT (>5% METAS AND MYELOS,OCC PRO NOTED) TOXIC GRANULATION VACUOLATED NEUTROPHILS    Tear Drop Cells PRESENT    Dohle Bodies PRESENT    Burr Cells PRESENT     Comment: Performed at [facility], [place] [address], [place], [place] [address]  Comprehensive metabolic panel     Status: Abnormal   Collection Time: 07/10/19  5:35 PM  Result Value Ref Range   Sodium 135 135 - 145 mmol/L   Potassium 4.4 3.5 - 5.1 mmol/L   Chloride 96 (L) 98 - 111 mmol/L   CO2 23 22 - 32 mmol/L   Glucose, Bld 93 70 - 99 mg/dL   BUN 26 (H) 8 - 23 mg/dL   Creatinine, Ser 1.49 (H) 0.44 - 1.00 mg/dL   Calcium 9.4 8.9 - 10.3 mg/dL   Total Protein 8.3 (H) 6.5 - 8.1 g/dL   Albumin 3.7 3.5 - 5.0 g/dL   AST 19 15 - 41 U/L   ALT 15 0 - 44 U/L   Alkaline Phosphatase 331 (H) 38 - 126 U/L   Total Bilirubin 0.4 0.3 - 1.2 mg/dL   GFR calc non Af Amer 34 (L) >60 mL/min   GFR calc Af Amer 39 (L) >60 mL/min   Anion gap 16 (H) 5 - 15    Comment: Performed at [facility], [place] [address], [place], [place] [address]  PTT     Status: Abnormal   Collection Time: 07/10/19  5:35 PM  Result Value Ref Range   aPTT 48 (H) 24 - 36 seconds    Comment:        IF  BASELINE aPTT IS ELEVATED, SUGGEST PATIENT RISK ASSESSMENT BE USED TO DETERMINE APPROPRIATE ANTICOAGULANT THERAPY. Performed at [facility], [place] [address], [place], [place] [address]   PT/INR     Status: Abnormal   Collection Time: 07/10/19  5:35 PM  Result Value Ref Range   Prothrombin Time 15.7 (H) 11.4 - 15.2 seconds   INR 1.3 (H) 0.8 - 1.2    Comment: (NOTE) INR goal varies based on device and disease states. Performed at [facility], [place] [address], [place], [place] [address]   Uric acid     Status: Abnormal   Collection Time: 07/10/19  5:35 PM  Result Value Ref Range   Uric Acid, Serum 14.1 (H) 2.5 - 7.1 mg/dL    Comment: Performed at [facility], [place] [address] [place], [place] [address]  Lactate     Status: None   Collection Time: 07/10/19  7:31 PM  Result Value Ref Range   Lactic Acid, Venous 1.2 0.5 - 1.9 mmol/L    Comment: Performed at [facility], [place] [address] [place], [place] [address]  Sedimentation rate     Status: Abnormal   Collection Time: 07/10/19  7:31 PM  Result Value Ref Range   Sed Rate 70 (H) 0 - 22 mm/hr    Comment: Performed at [facility], [place] [address] [place], [place] [address]  C-reactive protein     Status: Abnormal   Collection Time: 07/10/19  7:31 PM  Result Value Ref Range   CRP 1.9 (H) <1.0 mg/dL    Comment: Performed at [facility], [place] [address], [place], [place] [address]  SARS CORONAVIRUS 2 (TAT 6-24 HRS) Nasopharyngeal Nasopharyngeal Swab     Status: None   Collection Time: 07/10/19  9:21 PM   Specimen: Nasopharyngeal Swab  Result Value Ref Range   SARS Coronavirus 2 NEGATIVE NEGATIVE    Comment: (NOTE) SARS-CoV-2 target nucleic acids are NOT DETECTED. The SARS-CoV-2 RNA is generally detectable in upper and lower respiratory specimens during the acute phase of infection. Negative results do not preclude  SARS-CoV-2 infection, do not rule out co-infections with other pathogens, and should not be used as the sole basis for treatment or other patient management decisions. Negative results must be combined with clinical observations, patient history, and epidemiological information. The expected result is Negative. Fact Sheet for Patients: SugarRoll.be Fact Sheet for Healthcare Providers: https://www.woods-mathews.com/ This test is not yet approved or cleared by the [place] FDA and  has been authorized for detection and/or diagnosis of SARS-CoV-2 by FDA under an Emergency Use Authorization (EUA). This EUA will remain  in effect (meaning this test can be used) for the duration of the COVID-19 declaration under Section 56 4(b)(1) of the Act, 21 U.S.C. section 360bbb-3(b)(1), unless the authorization is terminated or revoked sooner. Performed at [facility], [place] [address], [place], [place] [address]   CBC     Status: Abnormal   Collection Time: 07/11/19  5:00 AM  Result Value Ref Range   WBC 92.2 (HH) 4.0 - 10.5 K/uL    Comment: REPEATED TO VERIFY THIS CRITICAL RESULT HAS VERIFIED AND BEEN CALLED TO [place], RN BY LISA EDWARDS ON 12 05 2020 AT 0644, AND HAS BEEN READ BACK.  RBC 2.99 (L) 3.87 - 5.11 MIL/uL   Hemoglobin 9.1 (L) 12.0 - 15.0 g/dL   HCT 29.7 (L) 36.0 - 46.0 %   MCV 99.3 80.0 - 100.0 fL   MCH 30.4 26.0 - 34.0 pg   MCHC 30.6 30.0 - 36.0 g/dL   RDW 15.3 11.5 - 15.5 %   Platelets 129 (L) 150 - 400 K/uL   nRBC 0.0 0.0 - 0.2 %    Comment: Performed at [facility], [place] [address], [place], [place] [address]  Protein / creatinine ratio, urine     Status: Abnormal   Collection Time: 07/11/19  2:43 PM  Result Value Ref Range   Creatinine, Urine 160.27 mg/dL   Total Protein, Urine 59 mg/dL    Comment: NO NORMAL RANGE ESTABLISHED FOR THIS TEST   Protein Creatinine Ratio 0.37 (H) 0.00 - 0.15 mg/mg[Cre]     Comment: Performed at [facility], [place] [address] [place], [place] [address]  Urinalysis, Routine w reflex microscopic     Status: Abnormal   Collection Time: 07/11/19  2:43 PM  Result Value Ref Range   Color, Urine YELLOW YELLOW   APPearance CLOUDY (A) CLEAR   Specific Gravity, Urine 1.021 1.005 - 1.030   pH 5.0 5.0 - 8.0   Glucose, UA NEGATIVE NEGATIVE mg/dL   Hgb urine dipstick MODERATE (A) NEGATIVE   Bilirubin Urine NEGATIVE NEGATIVE   Ketones, ur 5 (A) NEGATIVE mg/dL   Protein, ur NEGATIVE NEGATIVE mg/dL   Nitrite NEGATIVE NEGATIVE   Leukocytes,Ua TRACE (A) NEGATIVE   RBC / HPF 0-5 0 - 5 RBC/hpf   WBC, UA 0-5 0 - 5 WBC/hpf   Bacteria, UA RARE (A) NONE SEEN   Squamous Epithelial / LPF 0-5 0 - 5   Mucus PRESENT     Comment: Performed at [facility], [place] [address] [place], [place] [address]  Phosphorus     Status: None   Collection Time: 07/11/19  2:55 PM  Result Value Ref Range   Phosphorus 4.2 2.5 - 4.6 mg/dL    Comment: Performed at [facility], [place] [address] [place], [place] [address]  CK     Status: None   Collection Time: 07/11/19  2:55 PM  Result Value Ref Range   Total CK 161 38 - 234 U/L    Comment: Performed at [facility], [place] [address], [place], [place] [address]  Magnesium     Status: None   Collection Time: 07/11/19  2:55 PM  Result Value Ref Range   Magnesium 1.8 1.7 - 2.4 mg/dL    Comment: Performed at [facility], [place] [address], [place], [place] [address]  Albumin     Status: Abnormal   Collection Time: 07/11/19  2:55 PM  Result Value Ref Range   Albumin 3.1 (L) 3.5 - 5.0 g/dL    Comment: Performed at [facility], [place] [address] [place], [place] [address]  CBC with Differential/Platelet     Status: Abnormal   Collection Time: 07/12/19  1:29 AM  Result Value Ref Range   WBC 105.4 (HH) 4.0 - 10.5 K/uL    Comment: REPEATED TO  VERIFY WHITE COUNT CONFIRMED ON SMEAR CRITICAL VALUE NOTED.  VALUE IS CONSISTENT WITH PREVIOUSLY REPORTED AND CALLED VALUE.    RBC 2.82 (L) 3.87 - 5.11 MIL/uL   Hemoglobin 8.7 (L) 12.0 - 15.0 g/dL   HCT 28.0 (L) 36.0 - 46.0 %  MCV 99.3 80.0 - 100.0 fL   MCH 30.9 26.0 - 34.0 pg   MCHC 31.1 30.0 - 36.0 g/dL   RDW 15.3 11.5 - 15.5 %   Platelets 139 (L) 150 - 400 K/uL   nRBC 0.0 0.0 - 0.2 %   Neutrophils Relative % 86 %   Neutro Abs 90.5 (H) 1.7 - 7.7 K/uL   Lymphocytes Relative 3 %   Lymphs Abs 3.1 0.7 - 4.0 K/uL   Monocytes Relative 3 %   Monocytes Absolute 3.1 (H) 0.1 - 1.0 K/uL   Eosinophils Relative 1 %   Eosinophils Absolute 1.0 (H) 0.0 - 0.5 K/uL   Basophils Relative 0 %   Basophils Absolute 0.1 0.0 - 0.1 K/uL   WBC Morphology DOHLE BODIES     Comment: TOXIC GRANULATION VACUOLATED NEUTROPHILS    Immature Granulocytes 7 %   Abs Immature Granulocytes 7.59 (H) 0.00 - 0.07 K/uL   Ovalocytes PRESENT     Comment: Performed at [facility], [place] [address], [place], [place] [address]  Magnesium     Status: None   Collection Time: 07/12/19  1:29 AM  Result Value Ref Range   Magnesium 2.0 1.7 - 2.4 mg/dL    Comment: Performed at [facility], [place] [address] [place], [place] [address]  Uric acid     Status: Abnormal   Collection Time: 07/12/19  1:29 AM  Result Value Ref Range   Uric Acid, Serum 11.3 (H) 2.5 - 7.1 mg/dL    Comment: Performed at [facility], [place] [address] [place], [place] [address]  Comprehensive metabolic panel     Status: Abnormal   Collection Time: 07/12/19  1:29 AM  Result Value Ref Range   Sodium 135 135 - 145 mmol/L   Potassium 4.2 3.5 - 5.1 mmol/L   Chloride 102 98 - 111 mmol/L   CO2 21 (L) 22 - 32 mmol/L   Glucose, Bld 76 70 - 99 mg/dL   BUN 23 8 - 23 mg/dL   Creatinine, Ser 1.31 (H) 0.44 - 1.00 mg/dL   Calcium 8.8 (L) 8.9 - 10.3 mg/dL   Total Protein 7.6 6.5 - 8.1 g/dL   Albumin 3.4 (L)  3.5 - 5.0 g/dL   AST 18 15 - 41 U/L   ALT 11 0 - 44 U/L   Alkaline Phosphatase 274 (H) 38 - 126 U/L   Total Bilirubin 0.7 0.3 - 1.2 mg/dL   GFR calc non Af Amer 39 (L) >60 mL/min   GFR calc Af Amer 46 (L) >60 mL/min   Anion gap 12 5 - 15    Comment: Performed at [facility], [place] [address] [place], [place] [address]  Ferritin     Status: None   Collection Time: 07/12/19  1:29 AM  Result Value Ref Range   Ferritin 152 11 - 307 ng/mL    Comment: Performed at [facility], [place] [address], [place], [place] [address]  Iron and TIBC     Status: None   Collection Time: 07/12/19  1:29 AM  Result Value Ref Range   Iron 46 28 - 170 ug/dL   TIBC 339 250 - 450 ug/dL   Saturation Ratios 14 10.4 - 31.8 %   UIBC 293 ug/dL    Comment: Performed at [facility], [place] [address], [place], [place] [address]  Vitamin B12     Status: None   Collection Time: 07/12/19  1:29 AM  Result Value Ref Range   Vitamin B-12 448 180 - 914 pg/mL    Comment: (NOTE) This assay is not validated for testing neonatal or myeloproliferative syndrome specimens for Vitamin B12 levels. Performed at [facility], [place] [address] [place], [place] [address]   Reticulocytes     Status: Abnormal   Collection Time: 07/12/19  1:29 AM  Result Value Ref Range   Retic Ct Pct 1.7 0.4 - 3.1 %   RBC. 2.82 (L) 3.87 - 5.11 MIL/uL   Retic Count, Absolute 47.1 19.0 - 186.0 K/uL   Immature Retic Fract 22.5 (H) 2.3 - 15.9 %    Comment: Performed at [facility], [place] [address], [place], [place] [address]  Immature Platelet Fraction     Status: Abnormal   Collection Time: 07/12/19  1:29 AM  Result Value Ref Range   Immature Platelet Fraction 17.5 (H) 1.2 - 8.6 %    Comment:        An elevated IPF indicates increased platelet production. A low platelet count with an elevated IPF may be associated with peripheral platelet destruction  (e.g. DIC, ITP) or bone marrow recovery (e.g. after chemotherapy or transplant). A low platelet count with a low or non- elevated IPF is consistent with a platelet production disorder. Performed at [facility], [place] [address] [place],  [address]   Procalcitonin - Baseline     Status: None   Collection Time: 07/12/19  1:29 AM  Result Value Ref Range   Procalcitonin 0.89 ng/mL    Comment:        Interpretation: PCT > 0.5 ng/mL and <= 2 ng/mL: Systemic infection (sepsis) is possible, but other conditions are known to elevate PCT as well. (NOTE)       Sepsis PCT Algorithm           Lower Respiratory Tract                                      Infection PCT Algorithm    ----------------------------     ----------------------------         PCT < 0.25 ng/mL                PCT < 0.10 ng/mL         Strongly encourage             Strongly discourage   discontinuation of antibiotics    initiation of antibiotics    ----------------------------     -----------------------------       PCT 0.25 - 0.50 ng/mL            PCT 0.10 - 0.25 ng/mL               OR       >80% decrease in PCT            Discourage initiation of                                            antibiotics      Encourage discontinuation           of antibiotics    ----------------------------     -----------------------------         PCT >= 0.50  ng/mL              PCT 0.26 - 0.50 ng/mL                AND       <80% decrease in PCT             Encourage initiation of                                             antibiotics       Encourage continuation           of antibiotics    ----------------------------     -----------------------------        PCT >= 0.50 ng/mL                  PCT > 0.50 ng/mL               AND         increase in PCT                  Strongly encourage                                      initiation of antibiotics    Strongly encourage escalation           of antibiotics                                      -----------------------------                                           PCT <= 0.25 ng/mL                                                 OR                                        > 80% decrease in PCT                                     Discontinue / Do not initiate                                             antibiotics Performed at [place] [address], [place], [place] [address]     Chest Xray 2 View  Result Date: 07/10/2019 CLINICAL DATA:  Wheezing EXAM: CHEST - 2 VIEW COMPARISON:  05/29/2017 FINDINGS: Normal cardiomediastinal silhouette without pneumothorax or pleural effusion. Patchy airspace opacities over the posterior lung bases on lateral view. IMPRESSION: 1. Suspect posterior lower lobe infiltrates on the lateral view.  2. Mild cardiomegaly Electronically Signed   By: Donavan Foil M.D.   On: 07/10/2019 22:53   Ct Pelvis Wo Contrast  Result Date: 07/10/2019 CLINICAL DATA:  Hip pain, chronic possible lytic lesion left sacral ala. EXAM: CT PELVIS WITHOUT CONTRAST TECHNIQUE: Multidetector CT imaging of the pelvis was performed following the standard protocol without intravenous contrast. COMPARISON:  Plain film study of the same date., bone biopsy images of 06/24/2017. FINDINGS: Urinary Tract:  Choose 1 Bowel: No signs of acute bowel process within visualized portions of the bowel. Vascular/Lymphatic: Not well evaluated given lack of intravenous contrast. Signs of calcific atherosclerotic change throughout the distal abdominal aorta and the iliac vasculature. Reproductive:  Calcified fibroid in the uterine fundus. Other:  No signs of ascites. Musculoskeletal: Signs of bony sclerosis about the left sacroiliac joint, now associated with more bony erosion and indistinct margins than on the previous exam. Widening of the joint since the prior exam. Subtle stranding along the anterior margin of the joint becoming more pronounced  inferiorly. The hips are located bilaterally. IMPRESSION: 1. Signs of bony sclerosis about the left sacroiliac joint, now associated with more bony erosion and indistinct margins than on the previous exam. Findings are concerning for septic arthritis versus active sacroiliitis. No signs of fracture. Small cortical 2. Signs of calcific atherosclerotic change throughout the distal abdominal aorta and iliac vasculature. 3. Calcified fibroid in the uterine fundus. 4. Aortic atherosclerosis. Aortic Atherosclerosis (ICD10-I70.0). Electronically Signed   By: Zetta Bills M.D.   On: 07/10/2019 19:02   Dg Hip Unilat W Or Wo Pelvis 2-3 Views Left  Result Date: 07/10/2019 CLINICAL DATA:  Left-sided hip pain EXAM: DG HIP (WITH OR WITHOUT PELVIS) 2-3V LEFT COMPARISON:  CT 05/29/2017 FINDINGS: No fracture or malalignment. Mild joint space narrowing. Pubic symphysis and rami appear intact. Questionable lytic change involving the left sacral ala. IMPRESSION: 1. No acute osseous abnormality of left hip. Mild degenerative change 2. Questionable lytic process involving the left sacral ala. CT could be obtained for further evaluation. Electronically Signed   By: Donavan Foil M.D.   On: 07/10/2019 17:34    ROS is systems positive for TB of the spine with resection around 2002.  History of hypertension myeloproliferative disorder.  Previous bone marrow biopsy 2018.  Obesity.  Otherwise negative is a pertains HPI. Blood pressure (!) 157/77, pulse 100, temperature 98.4 F (36.9 C), temperature source Oral, resp. rate 16, height 5' 6"  (1.676 m), weight 135.1 kg, SpO2 93 %. Physical Exam  Constitutional: She is oriented to person, place, and time. She appears well-developed.  Obese pleasant cooperative alert oriented  HENT:  Head: Normocephalic.  Eyes: Pupils are equal, round, and reactive to light. Conjunctivae and EOM are normal.  Neck: Normal range of motion. Tracheal deviation present.  Cardiovascular: Normal rate.    Respiratory: Effort normal.  GI: Soft.  Neurological: She is alert and oriented to person, place, and time.  Skin: Skin is warm and dry.  Psychiatric: She has a normal mood and affect. Her behavior is normal.    Assessment/Plan: With patient with a stable sedimentation rate and minimal elevation of CRP likelihood of septic SI joint is minimal.  She has slight progression of the degenerative process that is present since 2018 images both MRI scan, CT scan and CT scan at the time of the bone marrow biopsy.  If patient became febrile appeared acutely ill sacroiliac aspiration by radiology under CT could be performed.  She has had chronic back pain  since TB of the spine with decompression laminectomies and has some degenerative facet changes which have been present for multiple years since her surgery.  Call if you have further questions.  My cell phone [phone]  Erika [name] 07/12/2019, 2:12 PM

## 2019-07-12 NOTE — Progress Notes (Signed)
Patient educated and instructed on use of flutter. Patient showed good effort when demonstrating.

## 2019-07-12 NOTE — Plan of Care (Signed)
  Problem: Clinical Measurements: Goal: Respiratory complications will improve Outcome: Progressing   Problem: Clinical Measurements: Goal: Cardiovascular complication will be avoided Outcome: Progressing   Problem: Nutrition: Goal: Adequate nutrition will be maintained Outcome: Progressing   Problem: Coping: Goal: Level of anxiety will decrease Outcome: Progressing   Problem: Elimination: Goal: Will not experience complications related to bowel motility Outcome: Progressing   

## 2019-07-12 NOTE — Progress Notes (Signed)
PROGRESS NOTE    Erika [name]  QMG:[id number] DOB: [birth year] DOA: 07/10/2019 PCP: [name], MD  Outpatient Specialists:   Brief Narrative:   Patient is a 78 year old African-American female, morbidly obese with BMI of 48 kg/m, and past medical history significant for myeloproliferative neoplasm NOS, hypertension, asthma, paroxysmal atrial fibrillation and status post resection of lumbar spine mass.  Patient also has history of chronic lower back pain, with possible radiculopathy.  Patient presented with worsening right-sided lower back pain and the inability to ambulate.  No associated constitutional symptoms.  On presentation, patient's WBC was noted to be 107.4 (prior documentation on 10/07/2018 revealed WBC of 61.3), serum creatinine of 1.49 (up from 0.95), uric acid of 14.1 (could not visualize prior levels), normal potassium (4.4), normal calcium (9.5) and normal phosphorus (4.2).  ESR is 70, but has been chronically elevated.  CT scan of the pelvis without contrast revealed findings worrisome for left septic arthritis versus active sacroiliitis.  Chest x-ray was suspicious for posterior lower lobe infiltrates.  Apparently, patient has not followed up with the oncology team since March of this year due to COVID-19 related logistics (according to the patient).  Oncology team has been consulted.  Patient is currently on antibiotics for possible septic arthritis.  07/12/2019: Patient seen.  Input from orthopedic and hematology/oncology team appreciated.  Based on orthopedic evaluation, will discontinue antibiotics.  Will monitor patient off of antibiotics.  Chest x-ray findings noted.  Will repeat chest x-ray.  Patient has no other constitutional symptoms (no fever, chills or productive cough).  Improvement in renal function not noted.  WBC is 105,000 today.  For bone marrow biopsy, as planned by the oncology team.   Assessment & Plan:   Principal Problem:   Myeloproliferative disorder  ([place]) Active Problems:   Obesity, Class III, BMI 40-49.9 (morbid obesity) (HCC)   Essential hypertension   Moderate persistent asthma   AF (paroxysmal atrial fibrillation) (HCC)   AKI (acute kidney injury) (HCC)   Thrombocytopenia (HCC)   Severe back pain/inability to ambulate/possible left-sided sacroiliitis versus septic left sacral iliac joint: -Patient reported right-sided back pain. -No history of left-sided back pain.  No reported constitutional symptoms (fever etc.) -Possibility of septic left sacroiliac joint was suggested by imaging study. -Patient has chronically elevated ESR -Patient is already committed to IV antibiotics -We will have a low threshold to consult orthopedic team to further direct work-up for possible septic joint as there may be need to de-escalate antibiotics 07/12/2019: Orthopedic team consulted.  Based on orthopedic team's recommendation, will discontinue antibiotics.  Monitor patient off of antibiotics.  Myeloproliferative Neoplasm NOS: -Hematology/Oncology input is appreciated -Discussed with Dr. Irene Limbo, Oncologist. -Further work-up is in progress, including repeat bone marrow aspiration/biopsy (ruling out worsening myeloproliferative neoplasm NOS versus AML transformation). 07/12/2019: Bone marrow biopsy is planned.  Oncology team is directing care.  Elevated uric acid level: -Uric acid is 14.1. -Last uric acid level documented was then 2015, and it was 6.9. -Calcium is normal, potassium is normal and phosphorus is normal. -Mild AKI noted (serum creatinine of 1.49, up from 0.95.  UA revealed specific gravity of 1.021) -Tumor lysis syndrome is being questioned, however, patient may not meet criteria for tumor lysis syndrome. -Repeat uric acid level. -Monitor renal function closely. -Further management depend on hospital course. 07/12/2019: Uric acid is down to 11.3.  Monitor daily uric acid level.  Paroxysmal atrial fibrillation: Heart rate is  controlled Continue current medication (patient is on metoprolol 12.5 mg p.o. twice  daily and apixaban 5 mg p.o. twice daily).  Hypertension: Controlled. Continue current management.  Morbid obesity: Further management on outpatient basis.  Possible lower lobe infiltrates as per chest x-ray: -No pneumonic symptoms. -May be related to body habitus. -Check procalcitonin -Incentive and flutter valve device 07/12/2019: Repeat chest x-ray.  Kidney injury: -Suspect prerenal. -Continue IV fluids. -Liberal oral intake. -Improving.  Serum creatinine is down from 1.49-1.31. -Monitor renal function and electrolytes.  DVT prophylaxis: Apixaban. Code Status: Full code Family Communication:  Disposition Plan: This will depend on hospital course   Consultants:   Hematology/oncology  We will have low threshold to consult orthopedic team.  Procedures:   None  Antimicrobials:   IV vancomycin  IV ceftriaxone started on 07/11/2019.  IV cefepime discontinued   Subjective: No new complaints No fever or chills No coughing No chest pain no shortness of breath Back pain persists  Objective: Vitals:   07/11/19 2022 07/11/19 2159 07/12/19 0533 07/12/19 0813  BP:  129/80 (!) 157/77   Pulse:  98 100   Resp:  16 16   Temp:  98.4 F (36.9 C) 98.4 F (36.9 C)   TempSrc:  Oral Oral   SpO2: 94% 97% 94% 93%  Weight:      Height:        Intake/Output Summary (Last 24 hours) at 07/12/2019 1008 Last data filed at 07/12/2019 0948 Gross per 24 hour  Intake 4186.8 ml  Output 1600 ml  Net 2586.8 ml   Filed Weights   07/10/19 1639 07/11/19 1119  Weight: 134.7 kg 135.1 kg    Examination:  General exam: Appears calm and comfortable.  Patient is morbidly obese. Respiratory system: Clear to auscultation. Respiratory effort normal. Cardiovascular system: S1 & S2 heard Gastrointestinal system: Abdomen is morbidly obese, soft and nontender. No organomegaly or masses felt. Normal  bowel sounds heard. Central nervous system: Alert and oriented.  Patient moves all extremities.   Extremities: Chronic mild edema/fullness of the ankle  Data Reviewed: I have personally reviewed following labs and imaging studies  CBC: Recent Labs  Lab 07/10/19 1735 07/11/19 0500 07/12/19 0129  WBC 107.4* 92.2* 105.4*  NEUTROABS 93.5*  --  90.5*  HGB 10.0* 9.1* 8.7*  HCT 30.9* 29.7* 28.0*  MCV 98.7 99.3 99.3  PLT 145* 129* 161*   Basic Metabolic Panel: Recent Labs  Lab 07/10/19 1735 07/11/19 1455 07/12/19 0129  NA 135  --  135  K 4.4  --  4.2  CL 96*  --  102  CO2 23  --  21*  GLUCOSE 93  --  76  BUN 26*  --  23  CREATININE 1.49*  --  1.31*  CALCIUM 9.4  --  8.8*  MG  --  1.8 2.0  PHOS  --  4.2  --    GFR: Estimated Creatinine Clearance: 51.7 mL/min (A) (by C-G formula based on SCr of 1.31 mg/dL (H)). Liver Function Tests: Recent Labs  Lab 07/10/19 1735 07/11/19 1455 07/12/19 0129  AST 19  --  18  ALT 15  --  11  ALKPHOS 331*  --  274*  BILITOT 0.4  --  0.7  PROT 8.3*  --  7.6  ALBUMIN 3.7 3.1* 3.4*   No results for input(s): LIPASE, AMYLASE in the last 168 hours. No results for input(s): AMMONIA in the last 168 hours. Coagulation Profile: Recent Labs  Lab 07/10/19 1735  INR 1.3*   Cardiac Enzymes: Recent Labs  Lab 07/11/19 1455  CKTOTAL  161   BNP (last 3 results) No results for input(s): PROBNP in the last 8760 hours. HbA1C: No results for input(s): HGBA1C in the last 72 hours. CBG: No results for input(s): GLUCAP in the last 168 hours. Lipid Profile: No results for input(s): CHOL, HDL, LDLCALC, TRIG, CHOLHDL, LDLDIRECT in the last 72 hours. Thyroid Function Tests: No results for input(s): TSH, T4TOTAL, FREET4, T3FREE, THYROIDAB in the last 72 hours. Anemia Panel: Recent Labs    07/12/19 0129  VITAMINB12 448  FERRITIN 152  TIBC 339  IRON 46  RETICCTPCT 1.7   Urine analysis:    Component Value Date/Time   COLORURINE YELLOW  07/11/2019 1443   APPEARANCEUR CLOUDY (A) 07/11/2019 1443   LABSPEC 1.021 07/11/2019 1443   PHURINE 5.0 07/11/2019 1443   GLUCOSEU NEGATIVE 07/11/2019 1443   HGBUR MODERATE (A) 07/11/2019 1443   BILIRUBINUR NEGATIVE 07/11/2019 1443   KETONESUR 5 (A) 07/11/2019 1443   PROTEINUR NEGATIVE 07/11/2019 1443   UROBILINOGEN 1.0 09/09/2012 0051   NITRITE NEGATIVE 07/11/2019 1443   LEUKOCYTESUR TRACE (A) 07/11/2019 1443   Sepsis Labs: _0 (procalcitonin:4,lacticidven:4)  ) Recent Results (from the past 240 hour(s))  SARS CORONAVIRUS 2 (TAT 6-24 HRS) Nasopharyngeal Nasopharyngeal Swab     Status: None   Collection Time: 07/10/19  9:21 PM   Specimen: Nasopharyngeal Swab  Result Value Ref Range Status   SARS Coronavirus 2 NEGATIVE NEGATIVE Final    Comment: (NOTE) SARS-CoV-2 target nucleic acids are NOT DETECTED. The SARS-CoV-2 RNA is generally detectable in upper and lower respiratory specimens during the acute phase of infection. Negative results do not preclude SARS-CoV-2 infection, do not rule out co-infections with other pathogens, and should not be used as the sole basis for treatment or other patient management decisions. Negative results must be combined with clinical observations, patient history, and epidemiological information. The expected result is Negative. Fact Sheet for Patients: SugarRoll.be Fact Sheet for Healthcare Providers: https://www.woods-mathews.com/ This test is not yet approved or cleared by the [place] FDA and  has been authorized for detection and/or diagnosis of SARS-CoV-2 by FDA under an Emergency Use Authorization (EUA). This EUA will remain  in effect (meaning this test can be used) for the duration of the COVID-19 declaration under Section 56 4(b)(1) of the Act, 21 U.S.C. section 360bbb-3(b)(1), unless the authorization is terminated or revoked sooner. Performed at [facility], [place]  [address] [place], [place] [address]          Radiology Studies: Chest Xray 2 View  Result Date: 07/10/2019 CLINICAL DATA:  Wheezing EXAM: CHEST - 2 VIEW COMPARISON:  05/29/2017 FINDINGS: Normal cardiomediastinal silhouette without pneumothorax or pleural effusion. Patchy airspace opacities over the posterior lung bases on lateral view. IMPRESSION: 1. Suspect posterior lower lobe infiltrates on the lateral view. 2. Mild cardiomegaly Electronically Signed   By: Donavan Foil M.D.   On: 07/10/2019 22:53   Ct Pelvis Wo Contrast  Result Date: 07/10/2019 CLINICAL DATA:  Hip pain, chronic possible lytic lesion left sacral ala. EXAM: CT PELVIS WITHOUT CONTRAST TECHNIQUE: Multidetector CT imaging of the pelvis was performed following the standard protocol without intravenous contrast. COMPARISON:  Plain film study of the same date., bone biopsy images of 06/24/2017. FINDINGS: Urinary Tract:  Choose 1 Bowel: No signs of acute bowel process within visualized portions of the bowel. Vascular/Lymphatic: Not well evaluated given lack of intravenous contrast. Signs of calcific atherosclerotic change throughout the distal abdominal aorta and the iliac vasculature. Reproductive:  Calcified fibroid in the  uterine fundus. Other:  No signs of ascites. Musculoskeletal: Signs of bony sclerosis about the left sacroiliac joint, now associated with more bony erosion and indistinct margins than on the previous exam. Widening of the joint since the prior exam. Subtle stranding along the anterior margin of the joint becoming more pronounced inferiorly. The hips are located bilaterally. IMPRESSION: 1. Signs of bony sclerosis about the left sacroiliac joint, now associated with more bony erosion and indistinct margins than on the previous exam. Findings are concerning for septic arthritis versus active sacroiliitis. No signs of fracture. Small cortical 2. Signs of calcific atherosclerotic change throughout the distal abdominal aorta and  iliac vasculature. 3. Calcified fibroid in the uterine fundus. 4. Aortic atherosclerosis. Aortic Atherosclerosis (ICD10-I70.0). Electronically Signed   By: Zetta Bills M.D.   On: 07/10/2019 19:02   Dg Hip Unilat W Or Wo Pelvis 2-3 Views Left  Result Date: 07/10/2019 CLINICAL DATA:  Left-sided hip pain EXAM: DG HIP (WITH OR WITHOUT PELVIS) 2-3V LEFT COMPARISON:  CT 05/29/2017 FINDINGS: No fracture or malalignment. Mild joint space narrowing. Pubic symphysis and rami appear intact. Questionable lytic change involving the left sacral ala. IMPRESSION: 1. No acute osseous abnormality of left hip. Mild degenerative change 2. Questionable lytic process involving the left sacral ala. CT could be obtained for further evaluation. Electronically Signed   By: Donavan Foil M.D.   On: 07/10/2019 17:34        Scheduled Meds:  allopurinol  150 mg Oral BID   amLODipine  10 mg Oral Daily   apixaban  5 mg Oral BID   hydroxyurea  500 mg Oral BID WC   metoprolol tartrate  12.5 mg Oral BID   mometasone-formoterol  2 puff Inhalation BID   pantoprazole  40 mg Oral Daily   pravastatin  20 mg Oral Daily   Continuous Infusions:  sodium chloride 100 mL/hr at 07/12/19 0718   cefTRIAXone (ROCEPHIN)  IV Stopped (07/11/19 1648)   vancomycin 1,500 mg (07/12/19 0952)     LOS: 2 days    Time spent: 35 minutes   Dana Allan, MD  [facility] Pager #: [phone] 7PM-7AM contact night coverage as above

## 2019-07-12 NOTE — Progress Notes (Addendum)
CRITICAL VALUE ALERT  Critical Value:  CXR results called by Radiologist to RN  Date & Time Notied:  07-12-2019 1809  Provider Notified: Dr. Marthenia Rolling  Orders Received/Actions taken: kvo iv fluids

## 2019-07-13 ENCOUNTER — Encounter (HOSPITAL_COMMUNITY): Payer: Self-pay | Admitting: Radiology

## 2019-07-13 ENCOUNTER — Inpatient Hospital Stay (HOSPITAL_COMMUNITY): Payer: PPO

## 2019-07-13 DIAGNOSIS — I480 Paroxysmal atrial fibrillation: Secondary | ICD-10-CM

## 2019-07-13 DIAGNOSIS — E883 Tumor lysis syndrome: Secondary | ICD-10-CM

## 2019-07-13 DIAGNOSIS — N179 Acute kidney failure, unspecified: Secondary | ICD-10-CM

## 2019-07-13 DIAGNOSIS — I10 Essential (primary) hypertension: Secondary | ICD-10-CM

## 2019-07-13 LAB — RENAL FUNCTION PANEL
Albumin: 2.9 g/dL — ABNORMAL LOW (ref 3.5–5.0)
Anion gap: 11 (ref 5–15)
BUN: 12 mg/dL (ref 8–23)
CO2: 23 mmol/L (ref 22–32)
Calcium: 9.3 mg/dL (ref 8.9–10.3)
Chloride: 103 mmol/L (ref 98–111)
Creatinine, Ser: 0.9 mg/dL (ref 0.44–1.00)
GFR calc Af Amer: >60 mL/min (ref >60)
GFR calc non Af Amer: >60 mL/min (ref >60)
Glucose, Bld: 63 mg/dL — ABNORMAL LOW (ref 70–99)
Phosphorus: 3.4 mg/dL (ref 2.5–4.6)
Potassium: 4 mmol/L (ref 3.5–5.1)
Sodium: 137 mmol/L (ref 135–145)

## 2019-07-13 LAB — CBC WITH DIFFERENTIAL/PLATELET
Abs Immature Granulocytes: 8.99 10*3/uL — ABNORMAL HIGH (ref 0.00–0.07)
Basophils Absolute: 0.1 10*3/uL (ref 0.0–0.1)
Basophils Relative: 0 %
Eosinophils Absolute: 0.5 10*3/uL (ref 0.0–0.5)
Eosinophils Relative: 1 %
HCT: 29.3 % — ABNORMAL LOW (ref 36.0–46.0)
Hemoglobin: 9.1 g/dL — ABNORMAL LOW (ref 12.0–15.0)
Immature Granulocytes: 8 %
Lymphocytes Relative: 3 %
Lymphs Abs: 3.3 10*3/uL (ref 0.7–4.0)
MCH: 31.1 pg (ref 26.0–34.0)
MCHC: 31.1 g/dL (ref 30.0–36.0)
MCV: 100 fL (ref 80.0–100.0)
Monocytes Absolute: 3.3 10*3/uL — ABNORMAL HIGH (ref 0.1–1.0)
Monocytes Relative: 3 %
Neutro Abs: 93.2 10*3/uL — ABNORMAL HIGH (ref 1.7–7.7)
Neutrophils Relative %: 85 %
Platelets: 131 10*3/uL — ABNORMAL LOW (ref 150–400)
RBC: 2.93 MIL/uL — ABNORMAL LOW (ref 3.87–5.11)
RDW: 15.4 % (ref 11.5–15.5)
WBC: 109.5 10*3/uL (ref 4.0–10.5)
nRBC: 0 % (ref 0.0–0.2)

## 2019-07-13 LAB — COMPREHENSIVE METABOLIC PANEL
ALT: 11 U/L (ref 0–44)
AST: 16 U/L (ref 15–41)
Albumin: 2.9 g/dL — ABNORMAL LOW (ref 3.5–5.0)
Alkaline Phosphatase: 320 U/L — ABNORMAL HIGH (ref 38–126)
Anion gap: 13 (ref 5–15)
BUN: 12 mg/dL (ref 8–23)
CO2: 23 mmol/L (ref 22–32)
Calcium: 9.4 mg/dL (ref 8.9–10.3)
Chloride: 102 mmol/L (ref 98–111)
Creatinine, Ser: 0.9 mg/dL (ref 0.44–1.00)
GFR calc Af Amer: >60 mL/min (ref >60)
GFR calc non Af Amer: >60 mL/min (ref >60)
Glucose, Bld: 62 mg/dL — ABNORMAL LOW (ref 70–99)
Potassium: 4.1 mmol/L (ref 3.5–5.1)
Sodium: 138 mmol/L (ref 135–145)
Total Bilirubin: 0.4 mg/dL (ref 0.3–1.2)
Total Protein: 7.3 g/dL (ref 6.5–8.1)

## 2019-07-13 LAB — URIC ACID: Uric Acid, Serum: 7.1 mg/dL (ref 2.5–7.1)

## 2019-07-13 LAB — PROTIME-INR
INR: 1.2 (ref 0.8–1.2)
Prothrombin Time: 15 seconds (ref 11.4–15.2)

## 2019-07-13 LAB — MAGNESIUM: Magnesium: 1.8 mg/dL (ref 1.7–2.4)

## 2019-07-13 MED ORDER — MIDAZOLAM HCL 2 MG/2ML IJ SOLN
INTRAMUSCULAR | Status: AC
  Filled 2019-07-13: qty 4

## 2019-07-13 MED ORDER — MIDAZOLAM HCL 2 MG/2ML IJ SOLN
INTRAMUSCULAR | Status: AC | PRN
  Administered 2019-07-13 (×3): 1 mg via INTRAVENOUS

## 2019-07-13 MED ORDER — LIDOCAINE-EPINEPHRINE 1 %-1:100000 IJ SOLN
INTRAMUSCULAR | Status: AC | PRN
  Administered 2019-07-13: 10 mL

## 2019-07-13 MED ORDER — FENTANYL CITRATE (PF) 100 MCG/2ML IJ SOLN
INTRAMUSCULAR | Status: AC
  Filled 2019-07-13: qty 4

## 2019-07-13 MED ORDER — FLUMAZENIL 0.5 MG/5ML IV SOLN
INTRAVENOUS | Status: AC
  Filled 2019-07-13: qty 5

## 2019-07-13 MED ORDER — FENTANYL CITRATE (PF) 100 MCG/2ML IJ SOLN
INTRAMUSCULAR | Status: AC | PRN
  Administered 2019-07-13 (×3): 50 ug via INTRAVENOUS

## 2019-07-13 MED ORDER — NALOXONE HCL 0.4 MG/ML IJ SOLN
INTRAMUSCULAR | Status: AC
  Filled 2019-07-13: qty 1

## 2019-07-13 MED ORDER — SODIUM CHLORIDE 0.9 % IV SOLN
INTRAVENOUS | Status: AC
  Filled 2019-07-13: qty 250

## 2019-07-13 NOTE — Care Management Important Message (Signed)
Important Message  Patient Details IM Letter given to Rhea Pink SW to present to the Patient Name: Erika [name] MRN: [id number] Date of Birth: 1940-08-24   Medicare Important Message Given:  Yes     Kerin Salen 07/13/2019, 12:44 PM

## 2019-07-13 NOTE — Procedures (Signed)
Pre-procedure Diagnosis: Leukocytosis.  Post-procedure Diagnosis: Same  Technically successful CT guided bone marrow aspiration and biopsy of left iliac crest.   Complications: None Immediate  EBL: None  Signed: Sandi Mariscal Pager: [phone] 07/13/2019, 10:32 AM

## 2019-07-13 NOTE — Progress Notes (Addendum)
[name]   HEMATOLOGY/ONCOLOGY INPATIENT PROGRESS NOTE  Date of Service: 07/13/2019  Inpatient Attending: .Murlean Iba, MD   SUBJECTIVE  Patient notes her back pain is somewhat improved but she is still bedbound at this time.  Had bone marrow biopsy performed in interventional radiology earlier today.  He is report a small amount of swelling in her ankles.  Tolerating hydroxyurea without any difficulty.  Still with significant leukocytosis.  Uric acid levels has normalized.  Creatinine improving.   OBJECTIVE:  NAD  PHYSICAL EXAMINATION: . Vitals:   07/13/19 1133 07/13/19 1146 07/13/19 1201 07/13/19 1236  BP: (!) 147/65 (!) 146/64 (!) 147/63 (!) 155/69  Pulse: (!) 111 (!) 113 (!) 114 (!) 113  Resp: 20 20 20 20   Temp: 98.7 F (37.1 C) 98.8 F (37.1 C) 98.6 F (37 C) 98.6 F (37 C)  TempSrc: Oral Oral Oral Oral  SpO2: 96% 94% 94% 98%  Weight:      Height:       Filed Weights   07/10/19 1639 07/11/19 1119 07/13/19 1058  Weight: 134.7 kg 135.1 kg 136 kg   .Body mass index is 48.39 kg/m.  GENERAL:alert, in no acute distress and comfortable LYMPH:  no palpable lymphadenopathy in the cervical, axillary or inguinal LUNGS: clear to auscultation with normal respiratory effort HEART: regular rate & rhythm,  no murmurs and no lower extremity edema ABDOMEN: abdomen soft, non-tender, normoactive bowel sounds  Musculoskeletal: Trace pedal edema PSYCH: alert & oriented x 3 with fluent speech NEURO: no focal motor/sensory deficits  MEDICAL HISTORY:  Past Medical History:  Diagnosis Date   Anemia    with menses   Asthma    Back pain    status post surgery 2002   Breast cyst    Excesion with FNA, begnin in 2004.    Degenerative joint disease of spine    Imaging 2005,  Degenerative hypertrophic facet arthritis changes L4-5 and L5-S1.[name]    History of shingles    Recurrent with post herpetic neuralgia.    Hypertension    Lymphadenopathy    Of the mediastinum, Right  side CXR 2008, not read on 2010 cxr.    Menopause    Obesity    BMI 54   Psychosis ([place])    Secondary to prednisone.   Shingles    Stasis dermatitis    W/ LE edema, prviously on lasix now on mazxide.    SVT (supraventricular tachycardia) ([place]) June 2009   one run while hospitalized   Tuberculosis    active TB treated in 2002, hx of paraspinal lumbar TB,     SURGICAL HISTORY: Past Surgical History:  Procedure Laterality Date   BREAST CYST ASPIRATION Left    Breast cyst biopsy     CHOLECYSTECTOMY  02/02/2012   Procedure: LAPAROSCOPIC CHOLECYSTECTOMY;  Surgeon: Zenovia Jarred, MD;  Location: [facility];  Service: General;  Laterality: N/A;   Hemilaminectomy of L4, L5 and S1 decompression of tumor in epidural space  2000    SOCIAL HISTORY: Social History   Socioeconomic History   Marital status: Widowed    Spouse name: Not on file   Number of children: Not on file   Years of education: Not on file   Highest education level: Not on file  Occupational History   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   Occupation: volunteerd at [place]: Not on file   Food insecurity    Worry: Not  on file    Inability: Not on file   Transportation needs    Medical: Not on file    Non-medical: Not on file  Tobacco Use   Smoking status: Never Smoker   Smokeless tobacco: Never Used  Substance and Sexual Activity   Alcohol use: No    Alcohol/week: 0.0 standard drinks   Drug use: No   Sexual activity: Not Currently    Birth control/protection: Post-menopausal  Lifestyle   Physical activity    Days per week: Not on file    Minutes per session: Not on file   Stress: Not on file  Relationships   Social connections    Talks on phone: Not on file    Gets together: Not on file    Attends religious service: Not on file    Active member of club or organization: Not on file    Attends meetings of clubs or  organizations: Not on file    Relationship status: Not on file   Intimate partner violence    Fear of current or ex partner: Not on file    Emotionally abused: Not on file    Physically abused: Not on file    Forced sexual activity: Not on file  Other Topics Concern   Not on file  Social History Narrative   Current Social History 03/20/2019        Patient lives alone in a ground floor apartment which is 1 story. There are not steps up to the entrance the patient uses. One son stays with her once in a while.      Patient's method of transportation is personal car.      The highest level of education was some high school: 11th grade.      The patient currently retired.      Identified important Relationships are "My friend Mart Piggs, my nieces, my grandkids, a lot of people.       Pets : None       Interests / Fun: "Playing word games on my phone, watch TV, Talking on phone."       Current Stressors: "Nothing, really. I try not to let anything stress me."       Religious / Personal Beliefs: "I believe in God."       L. Ducatte, RN, BSN     FAMILY HISTORY: Family History  Problem Relation Age of Onset   Stroke Mother        at young age   Heart disease Mother    Heart attack Father    Heart disease Father    Cancer Sister        Unknown   Stroke Brother    Stroke Brother    Breast cancer Other    Sickle cell anemia Brother    Diabetes Brother    Stroke Brother     ALLERGIES:  is allergic to prednisone.  MEDICATIONS:  Scheduled Meds:  allopurinol  150 mg Oral BID   amLODipine  10 mg Oral Daily   apixaban  5 mg Oral BID   hydroxyurea  500 mg Oral BID WC   metoprolol tartrate  12.5 mg Oral BID   mometasone-formoterol  2 puff Inhalation BID   pantoprazole  40 mg Oral Daily   pravastatin  20 mg Oral Daily   Continuous Infusions:  sodium chloride 10 mL/hr at 07/13/19 0600   sodium chloride     PRN Meds:.albuterol,  HYDROcodone-acetaminophen  REVIEW OF SYSTEMS:  10 Point review of Systems was done is negative except as noted above.   LABORATORY DATA:  I have reviewed the data as listed  . CBC Latest Ref Rng & Units 07/13/2019 07/12/2019 07/11/2019  WBC 4.0 - 10.5 K/uL 109.5(HH) 105.4(HH) 92.2(HH)  Hemoglobin 12.0 - 15.0 g/dL 9.1(L) 8.7(L) 9.1(L)  Hematocrit 36.0 - 46.0 % 29.3(L) 28.0(L) 29.7(L)  Platelets 150 - 400 K/uL 131(L) 139(L) 129(L)    CMP Latest Ref Rng & Units 07/13/2019 07/13/2019 07/12/2019  Glucose 70 - 99 mg/dL 62(L) 63(L) 76  BUN 8 - 23 mg/dL 12 12 23   Creatinine 0.44 - 1.00 mg/dL 0.90 0.90 1.31(H)  Sodium 135 - 145 mmol/L 138 137 135  Potassium 3.5 - 5.1 mmol/L 4.1 4.0 4.2  Chloride 98 - 111 mmol/L 102 103 102  CO2 22 - 32 mmol/L 23 23 21(L)  Calcium 8.9 - 10.3 mg/dL 9.4 9.3 8.8(L)  Total Protein 6.5 - 8.1 g/dL 7.3 - 7.6  Total Bilirubin 0.3 - 1.2 mg/dL 0.4 - 0.7  Alkaline Phos 38 - 126 U/L 320(H) - 274(H)  AST 15 - 41 U/L 16 - 18  ALT 0 - 44 U/L 11 - 11   Component     Latest Ref Rng & Units 07/10/2019 07/12/2019  Iron     28 - 170 ug/dL  46  TIBC     250 - 450 ug/dL  339  Saturation Ratios     10.4 - 31.8 %  14  UIBC     ug/dL  293  Retic Ct Pct     0.4 - 3.1 %  1.7  RBC.     3.87 - 5.11 MIL/uL  2.82 (L)  Retic Count, Absolute     19.0 - 186.0 K/uL  47.1  Immature Retic Fract     2.3 - 15.9 %  22.5 (H)  Uric Acid, Serum     2.5 - 7.1 mg/dL 14.1 (H) 11.3 (H)  Ferritin     11 - 307 ng/mL  152  Vitamin B12     180 - 914 pg/mL  448  Immature Platelet Fraction     1.2 - 8.6 %  17.5 (H)    RADIOGRAPHIC STUDIES: I have personally reviewed the radiological images as listed and agreed with the findings in the report. Dg Chest 2 View  Result Date: 07/12/2019 CLINICAL DATA:  Patient with abnormalities on previous chest x-ray. EXAM: CHEST - 2 VIEW COMPARISON:  Chest x-ray 07/10/2019 FINDINGS: Marked cardiac enlargement similar to prior study accounting for  decreased lung volume seen on the current exam. Hilar fullness is accentuated also by low depth of expansion. Basilar opacities are noted bilaterally. Interstitial markings are increased with pulmonary vascular congestion. In the right lung base at the site previously obscured by cardiac lead is a nodular density with rounded morphology. This measures approximately 2.3 cm. IMPRESSION: 1. Stable marked cardiac enlargement with pulmonary vascular congestion. Question mild edema with diminishing lung volumes, consider correlation with signs of heart failure or volume overload. 2. Basilar opacities may represent atelectasis or infection. 3. Area of density with rounded morphology in the right lower lobe along with right hilar fullness. Consider CT of the chest when the patient is able for further assessment to exclude nodule with hilar adenopathy. Electronically Signed   By: Zetta Bills M.D.   On: 07/12/2019 17:56   Chest Xray 2 View  Result Date: 07/10/2019 CLINICAL DATA:  Wheezing EXAM: CHEST - 2 VIEW COMPARISON:  05/29/2017  FINDINGS: Normal cardiomediastinal silhouette without pneumothorax or pleural effusion. Patchy airspace opacities over the posterior lung bases on lateral view. IMPRESSION: 1. Suspect posterior lower lobe infiltrates on the lateral view. 2. Mild cardiomegaly Electronically Signed   By: Donavan Foil M.D.   On: 07/10/2019 22:53   Ct Pelvis Wo Contrast  Result Date: 07/10/2019 CLINICAL DATA:  Hip pain, chronic possible lytic lesion left sacral ala. EXAM: CT PELVIS WITHOUT CONTRAST TECHNIQUE: Multidetector CT imaging of the pelvis was performed following the standard protocol without intravenous contrast. COMPARISON:  Plain film study of the same date., bone biopsy images of 06/24/2017. FINDINGS: Urinary Tract:  Choose 1 Bowel: No signs of acute bowel process within visualized portions of the bowel. Vascular/Lymphatic: Not well evaluated given lack of intravenous contrast. Signs of  calcific atherosclerotic change throughout the distal abdominal aorta and the iliac vasculature. Reproductive:  Calcified fibroid in the uterine fundus. Other:  No signs of ascites. Musculoskeletal: Signs of bony sclerosis about the left sacroiliac joint, now associated with more bony erosion and indistinct margins than on the previous exam. Widening of the joint since the prior exam. Subtle stranding along the anterior margin of the joint becoming more pronounced inferiorly. The hips are located bilaterally. IMPRESSION: 1. Signs of bony sclerosis about the left sacroiliac joint, now associated with more bony erosion and indistinct margins than on the previous exam. Findings are concerning for septic arthritis versus active sacroiliitis. No signs of fracture. Small cortical 2. Signs of calcific atherosclerotic change throughout the distal abdominal aorta and iliac vasculature. 3. Calcified fibroid in the uterine fundus. 4. Aortic atherosclerosis. Aortic Atherosclerosis (ICD10-I70.0). Electronically Signed   By: Zetta Bills M.D.   On: 07/10/2019 19:02   Ct Biopsy  Result Date: 07/13/2019 INDICATION: History of MPN and CHEK 2 deletion now with progression of new anemia, thrombocytopenia and leukocytosis. Please perform CT-guided bone marrow biopsy for tissue diagnostic purposes. Note, questioned left-sided sacroiliitis with identified on recent pelvic CT and as such the decision was made to proceed with bone marrow biopsy of the right iliac crest. EXAM: CT-GUIDED BONE MARROW BIOPSY AND ASPIRATION MEDICATIONS: None ANESTHESIA/SEDATION: Fentanyl 150 mcg IV; Versed 3 mg IV Sedation Time: 10 Minutes; The patient was continuously monitored during the procedure by the interventional radiology nurse under my direct supervision. COMPLICATIONS: None immediate. PROCEDURE: Informed consent was obtained from the patient following an explanation of the procedure, risks, benefits and alternatives. The patient understands,  agrees and consents for the procedure. All questions were addressed. A time out was performed prior to the initiation of the procedure. The patient was positioned prone and non-contrast localization CT was performed of the pelvis to demonstrate the iliac marrow spaces. The operative site was prepped and draped in the usual sterile fashion. Under sterile conditions and local anesthesia, a 22 gauge spinal needle was utilized for procedural planning. Next, an 11 gauge coaxial bone biopsy needle was advanced into the right iliac marrow space. Needle position was confirmed with CT imaging. Initially, bone marrow aspiration was performed. Next, a bone marrow biopsy was obtained with the 11 gauge outer bone marrow device. Samples were prepared with the cytotechnologist and deemed adequate. The needle was removed intact. Hemostasis was obtained with compression and a dressing was placed. The patient tolerated the procedure well without immediate post procedural complication. IMPRESSION: Successful CT guided right iliac bone marrow aspiration and core biopsy. Electronically Signed   By: Sandi Mariscal M.D.   On: 07/13/2019 11:06   Dexascan  Result  Date: 06/30/2019 EXAM: DUAL X-RAY ABSORPTIOMETRY (DXA) FOR BONE MINERAL DENSITY IMPRESSION: Referring Physician:  [name] Your patient completed a BMD test using Lunar IDXA DXA system ( analysis version: 16 ) manufactured by [employer]. Technologist: AW PATIENT: Name: Kamaryn, Grimley Patient ID: [id number] Birth Date: 1940/09/10 Height:     65.0 in. Sex: Female Measured: 06/30/2019 Weight:     298.0 lbs. Indications: Advanced Age, Estrogen Deficient, Postmenopausal Fractures: None Treatments: None ASSESSMENT: The BMD measured at Femur Total Left is 0.848 g/cm2 with a T-score of -1.3. This patient is considered OSTEOPENIC according to [facility]) criteria. The scan quality is good. Site Region Measured Date Measured Age YA T-score BMD Significant  CHANGE AP Spine  L1-L4      8/30[age]         -1.2    1.062 g/cm2 DualFemur Total Left 8/30/2019    [age]         -1.3    0.848 g/cm2 DualFemur Total Mean 8/30/2019    [age]         -1.2    0.852 g/cm2 World Health Organization [facility]) criteria for post-menopausal, Caucasian Women: Normal       T-score at or above -1 SD Osteopenia   T-score between -1 and -2.5 SD Osteoporosis T-score at or below -2.5 SD RECOMMENDATION: 1. All patients should optimize calcium and vitamin D intake. 2. Consider FDA approved medical therapies in postmenopausal women and men aged 99 years and older, based on the following: a. A hip or vertebral (clinical or morphometric) fracture b. T- score < or = -2.5 at the femoral neck or spine after appropriate evaluation to exclude secondary causes c. Low bone mass (T-score between -1.0 and -2.5 at the femoral neck or spine) and a 10 year probability of a hip fracture > or = 3% or a 10 year probability of a major osteoporosis-related fracture > or = 20% based on the [place]-adapted WHO algorithm d. Clinician judgment and/or patient preferences may indicate treatment for people with 10-year fracture probabilities above or below these levels FOLLOW-UP: Patients with diagnosis of osteoporosis or at high risk for fracture should have regular bone mineral density tests. For patients eligible for Medicare, routine testing is allowed once every 2 years. The testing frequency can be increased to one year for patients who have rapidly progressing disease, those who are receiving or discontinuing medical therapy to restore bone mass, or have additional risk factors. I have reviewed this report and agree with the above findings. Mark A. Thornton Papas, M.D.  Radiology FRAX* 10-year Probability of Fracture Based on femoral neck BMD: DualFemur (Right) Major Osteoporotic Fracture: 3.9% Hip Fracture:                0.6% Population:                  [place] (Black) Risk Factors:                None *FRAX is a  Materials engineer of the [employer] for Metabolic Bone Disease, a World Pharmacologist (WHO) [facility]. ASSESSMENT: The probability of a major osteoporotic fracture is 3.9 % within the next ten years. The probability of a hip fracture is 0.6 % within the next ten years. I have reviewed this report and agree with the above findings. Mark A. Thornton Papas, M.D. [facility] Radiology Electronically Signed   By: Lavonia Dana M.D.   On: 06/30/2019  09:33   Mammogram Digital Screening  Result Date: 07/01/2019 CLINICAL DATA:  Screening. EXAM: DIGITAL SCREENING BILATERAL MAMMOGRAM WITH CAD COMPARISON:  Previous exam(s). ACR Breast Density Category b: There are scattered areas of fibroglandular density. FINDINGS: There are no findings suspicious for malignancy. Images were processed with CAD. IMPRESSION: No mammographic evidence of malignancy. A result letter of this screening mammogram will be mailed directly to the patient. RECOMMENDATION: Screening mammogram in one year. (Code:[id number]-B-01Y) BI-RADS CATEGORY  1: Negative. Electronically Signed   By: Lajean Manes M.D.   On: 07/01/2019 11:16   Ct Bone Marrow Biopsy & Aspiration  Result Date: 07/13/2019 INDICATION: History of MPN and CHEK 2 deletion now with progression of new anemia, thrombocytopenia and leukocytosis. Please perform CT-guided bone marrow biopsy for tissue diagnostic purposes. Note, questioned left-sided sacroiliitis with identified on recent pelvic CT and as such the decision was made to proceed with bone marrow biopsy of the right iliac crest. EXAM: CT-GUIDED BONE MARROW BIOPSY AND ASPIRATION MEDICATIONS: None ANESTHESIA/SEDATION: Fentanyl 150 mcg IV; Versed 3 mg IV Sedation Time: 10 Minutes; The patient was continuously monitored during the procedure by the interventional radiology nurse under my direct supervision. COMPLICATIONS: None immediate. PROCEDURE: Informed consent was obtained from the patient following an  explanation of the procedure, risks, benefits and alternatives. The patient understands, agrees and consents for the procedure. All questions were addressed. A time out was performed prior to the initiation of the procedure. The patient was positioned prone and non-contrast localization CT was performed of the pelvis to demonstrate the iliac marrow spaces. The operative site was prepped and draped in the usual sterile fashion. Under sterile conditions and local anesthesia, a 22 gauge spinal needle was utilized for procedural planning. Next, an 11 gauge coaxial bone biopsy needle was advanced into the right iliac marrow space. Needle position was confirmed with CT imaging. Initially, bone marrow aspiration was performed. Next, a bone marrow biopsy was obtained with the 11 gauge outer bone marrow device. Samples were prepared with the cytotechnologist and deemed adequate. The needle was removed intact. Hemostasis was obtained with compression and a dressing was placed. The patient tolerated the procedure well without immediate post procedural complication. IMPRESSION: Successful CT guided right iliac bone marrow aspiration and core biopsy. Electronically Signed   By: Sandi Mariscal M.D.   On: 07/13/2019 11:06   Dg Hip Unilat W Or Wo Pelvis 2-3 Views Left  Result Date: 07/10/2019 CLINICAL DATA:  Left-sided hip pain EXAM: DG HIP (WITH OR WITHOUT PELVIS) 2-3V LEFT COMPARISON:  CT 05/29/2017 FINDINGS: No fracture or malalignment. Mild joint space narrowing. Pubic symphysis and rami appear intact. Questionable lytic change involving the left sacral ala. IMPRESSION: 1. No acute osseous abnormality of left hip. Mild degenerative change 2. Questionable lytic process involving the left sacral ala. CT could be obtained for further evaluation. Electronically Signed   By: Donavan Foil M.D.   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  Foundation One heme with Chek 2 mutation. No other findings noted. JAK2 and BCR-ABL mutations neg.  06/24/17 BM bx- this showed hypercellular bone marrow with granulocytic proliferation. Cytogenetic analysis was normal.   #2 mild normocytic anemia. HGb at  9.1 today  #3  Mild thrombocytopenia at 129k---> 139k---> 131k  #4 left Sacroiliac  joint inflammation versus septic arthritis. #5 possible pneumonia  PLAN: -Continue patient on low dose hydroxyurea 536m po twice daily with meals.  No symptomatology suggestive of leukostasis.  In the setting of possible infection will hold off on ramping up hydroxyurea dose significantly. --IVF per hospitalist -continue Allopurinol -- daily uric acid levels uric acid levels coming down to 7.1. -Daily CBC CMP and uric acid -CT guided bone marrow aspiration and biopsy ordered to evaluate for change in her MPN/MDS ? Leukemic transformation.  Performed earlier today and we will follow-up on the results. -If possible would recommend CT-guided aspiration of left sacroiliac joint to rule out infection/inflammation to determine the etiology of her left sacroiliitis.  If infection is ruled out could consider left SI joint steroid injection for pain control. -Transfuse as needed for hemoglobin less than 8 or if symptomatic.  Given elevated white counts will try to avoid excessive transfusions due to low but possible risk of hyperviscosity.  KMikey Bussing DNP, AGPCNP-BC, AOCNP  07/13/2019 1:27 PM   ADDENDUM  .Patient was Personally and independently interviewed, examined and relevant elements of the history of present illness were reviewed in details and an assessment and plan was created. All elements of the patient's history of present illness , assessment and plan were discussed in details with KMikey Bussing DNP. The above  documentation reflects our combined findings assessment and plan.  GSullivan LoneMD MS

## 2019-07-13 NOTE — Progress Notes (Signed)
PROGRESS NOTE    Erika [name]  GDJ:[id number] DOB: August 25, 1940 DOA: 07/10/2019 PCP: [name], MD  Outpatient Specialists:   Brief Narrative:   Patient is a 78 year old African-American female, morbidly obese with BMI of 48 kg/m, and past medical history significant for myeloproliferative neoplasm NOS, hypertension, asthma, paroxysmal atrial fibrillation and status post resection of lumbar spine mass.  Patient also has history of chronic lower back pain, with possible radiculopathy.  Patient presented with worsening right-sided lower back pain and the inability to ambulate.  No associated constitutional symptoms.  On presentation, patient's WBC was noted to be 107.4 (prior documentation on 10/07/2018 revealed WBC of 61.3), serum creatinine of 1.49 (up from 0.95), uric acid of 14.1 (could not visualize prior levels), normal potassium (4.4), normal calcium (9.5) and normal phosphorus (4.2).  ESR is 70, but has been chronically elevated.  CT scan of the pelvis without contrast revealed findings worrisome for left septic arthritis versus active sacroiliitis.  Chest x-ray was suspicious for posterior lower lobe infiltrates.  Apparently, patient has not followed up with the oncology team since March of this year due to COVID-19 related logistics (according to the patient).  Oncology team has been consulted.  Patient is currently on antibiotics for possible septic arthritis.  07/12/2019: Patient seen.  Input from orthopedic and hematology/oncology team appreciated.  Based on orthopedic evaluation, will discontinue antibiotics.  Will monitor patient off of antibiotics.  Chest x-ray findings noted.  Will repeat chest x-ray.  Patient has no other constitutional symptoms (no fever, chills or productive cough).  Improvement in renal function not noted.  WBC is 105,000 today.  For bone marrow biopsy, as planned by the oncology team.  07/13/2019: Pt tentatively scheduled for IR guided bone marrow biopsy today.   WBC trending higher now at 109.5K.   Assessment & Plan:   Principal Problem:   Myeloproliferative disorder ([place]) Active Problems:   Obesity, Class III, BMI 40-49.9 (morbid obesity) (HCC)   Essential hypertension   Moderate persistent asthma   AF (paroxysmal atrial fibrillation) (HCC)   AKI (acute kidney injury) (HCC)   Thrombocytopenia (HCC)   Hyperuricemia   Severe back pain/inability to ambulate/possible left-sided sacroiliitis versus septic left sacral iliac joint: -Patient reported right-sided back pain. -No history of left-sided back pain.  No reported constitutional symptoms (fever etc.) -Possibility of septic left sacroiliac joint was suggested by imaging study. -Patient has chronically elevated ESR Orthopedic team consulted.  Based on orthopedic team's recommendation, will discontinue antibiotics.  Monitor patient off of antibiotics.  Myeloproliferative Neoplasm NOS: -Hematology/Oncology input is appreciated -Discussed with Dr. Irene Limbo, Oncologist. -Further work-up is in progress, including repeat bone marrow aspiration/biopsy (ruling out worsening myeloproliferative neoplasm NOS versus AML transformation). 12/67/2020: Bone marrow biopsy is tentatively scheduled for today.  Oncology team is directing care.  Elevated uric acid level: -Uric acid is 14.1. -Last uric acid level documented was then 2015, and it was 6.9. -Calcium is normal, potassium is normal and phosphorus is normal. -Mild AKI noted (serum creatinine of 1.49, up from 0.95.  UA revealed specific gravity of 1.021) -Tumor lysis syndrome is being questioned, however, patient may not meet criteria for tumor lysis syndrome. -Repeat uric acid level. -Monitor renal function closely. -Further management depend on hospital course. 07/13/2019: Uric acid is down to 7.1.  Monitor daily uric acid level.  Paroxysmal atrial fibrillation: Heart rate is controlled Continue current medication (patient is on metoprolol 12.5 mg  p.o. twice daily and apixaban 5 mg p.o. twice daily).  Hypertension: Controlled. Continue current management.  Morbid obesity: Further management on outpatient basis.  Possible lower lobe infiltrates as per chest x-ray: -No pneumonic symptoms. -May be related to body habitus. -Check procalcitonin -Incentive and flutter valve device 07/13/2019: Repeat chest x-ray in AM.  Acute Kidney injury: -Suspect prerenal. -Continue IV fluids. -Liberal oral intake. -Improving.  Serum creatinine is down from 1.49-1.31. -Monitor renal function and electrolytes.  DVT prophylaxis: Apixaban. Code Status: Full code Family Communication:  Disposition Plan: This will depend on hospital course   Consultants:   Hematology/oncology  Orthopedics   I.R.   Procedures:  Bone Marrow biopsy scheduled for 07/13/19 Antimicrobials:   IV vancomycin  IV ceftriaxone started on 07/11/2019.  IV cefepime discontinued   Subjective: Pt reports back pain manageable.   Objective: Vitals:   07/12/19 1929 07/12/19 2106 07/13/19 0549 07/13/19 0812  BP:  (!) 129/58 138/69   Pulse:  (!) 104 (!) 108   Resp:  18 17   Temp:  98.9 F (37.2 C) 98.8 F (37.1 C)   TempSrc:      SpO2: 93% 96% 96% 91%  Weight:      Height:        Intake/Output Summary (Last 24 hours) at 07/13/2019 0953 Last data filed at 07/13/2019 0600 Gross per 24 hour  Intake 2670.03 ml  Output 3500 ml  Net -829.97 ml   Filed Weights   07/10/19 1639 07/11/19 1119  Weight: 134.7 kg 135.1 kg    Examination:  General exam: chronically ill appearing, NAD.  Patient is morbidly obese. Respiratory system: Clear to auscultation. Respiratory effort normal. Cardiovascular system: normal S1 & S2 heard Gastrointestinal system: Abdomen is morbidly obese, soft and nontender. No organomegaly or masses felt. Normal bowel sounds heard. Central nervous system: Alert and oriented.  Patient moves all extremities.   Extremities: Chronic mild  edema/fullness of the ankle  Data Reviewed: I have personally reviewed following labs and imaging studies  CBC: Recent Labs  Lab 07/10/19 1735 07/11/19 0500 07/12/19 0129 07/13/19 0358  WBC 107.4* 92.2* 105.4* 109.5*  NEUTROABS 93.5*  --  90.5* 93.2*  HGB 10.0* 9.1* 8.7* 9.1*  HCT 30.9* 29.7* 28.0* 29.3*  MCV 98.7 99.3 99.3 100.0  PLT 145* 129* 139* 712*   Basic Metabolic Panel: Recent Labs  Lab 07/10/19 1735 07/11/19 1455 07/12/19 0129 07/13/19 0358  NA 135  --  135 138  137  K 4.4  --  4.2 4.1  4.0  CL 96*  --  102 102  103  CO2 23  --  21* 23  23  GLUCOSE 93  --  76 62*  63*  BUN 26*  --  23 12  12   CREATININE 1.49*  --  1.31* 0.90  0.90  CALCIUM 9.4  --  8.8* 9.4  9.3  MG  --  1.8 2.0 1.8  PHOS  --  4.2  --  3.4   GFR: Estimated Creatinine Clearance: 75.2 mL/min (by C-G formula based on SCr of 0.9 mg/dL). Liver Function Tests: Recent Labs  Lab 07/10/19 1735 07/11/19 1455 07/12/19 0129 07/13/19 0358  AST 19  --  18 16  ALT 15  --  11 11  ALKPHOS 331*  --  274* 320*  BILITOT 0.4  --  0.7 0.4  PROT 8.3*  --  7.6 7.3  ALBUMIN 3.7 3.1* 3.4* 2.9*  2.9*   No results for input(s): LIPASE, AMYLASE in the last 168 hours. No results for input(s): AMMONIA in  the last 168 hours. Coagulation Profile: Recent Labs  Lab 07/10/19 1735 07/13/19 0358  INR 1.3* 1.2   Cardiac Enzymes: Recent Labs  Lab 07/11/19 1455  CKTOTAL 161   BNP (last 3 results) No results for input(s): PROBNP in the last 8760 hours. HbA1C: No results for input(s): HGBA1C in the last 72 hours. CBG: No results for input(s): GLUCAP in the last 168 hours. Lipid Profile: No results for input(s): CHOL, HDL, LDLCALC, TRIG, CHOLHDL, LDLDIRECT in the last 72 hours. Thyroid Function Tests: No results for input(s): TSH, T4TOTAL, FREET4, T3FREE, THYROIDAB in the last 72 hours. Anemia Panel: Recent Labs    07/12/19 0129  VITAMINB12 448  FERRITIN 152  TIBC 339  IRON 46  RETICCTPCT  1.7   Urine analysis:    Component Value Date/Time   COLORURINE YELLOW 07/11/2019 1443   APPEARANCEUR CLOUDY (A) 07/11/2019 1443   LABSPEC 1.021 07/11/2019 1443   PHURINE 5.0 07/11/2019 1443   GLUCOSEU NEGATIVE 07/11/2019 1443   HGBUR MODERATE (A) 07/11/2019 1443   BILIRUBINUR NEGATIVE 07/11/2019 1443   KETONESUR 5 (A) 07/11/2019 1443   PROTEINUR NEGATIVE 07/11/2019 1443   UROBILINOGEN 1.0 09/09/2012 0051   NITRITE NEGATIVE 07/11/2019 1443   LEUKOCYTESUR TRACE (A) 07/11/2019 1443    Recent Results (from the past 240 hour(s))  SARS CORONAVIRUS 2 (TAT 6-24 HRS) Nasopharyngeal Nasopharyngeal Swab     Status: None   Collection Time: 07/10/19  9:21 PM   Specimen: Nasopharyngeal Swab  Result Value Ref Range Status   SARS Coronavirus 2 NEGATIVE NEGATIVE Final    Comment: (NOTE) SARS-CoV-2 target nucleic acids are NOT DETECTED. The SARS-CoV-2 RNA is generally detectable in upper and lower respiratory specimens during the acute phase of infection. Negative results do not preclude SARS-CoV-2 infection, do not rule out co-infections with other pathogens, and should not be used as the sole basis for treatment or other patient management decisions. Negative results must be combined with clinical observations, patient history, and epidemiological information. The expected result is Negative. Fact Sheet for Patients: SugarRoll.be Fact Sheet for Healthcare Providers: https://www.woods-mathews.com/ This test is not yet approved or cleared by the [place] FDA and  has been authorized for detection and/or diagnosis of SARS-CoV-2 by FDA under an Emergency Use Authorization (EUA). This EUA will remain  in effect (meaning this test can be used) for the duration of the COVID-19 declaration under Section 56 4(b)(1) of the Act, 21 U.S.C. section 360bbb-3(b)(1), unless the authorization is terminated or revoked sooner. Performed at [place], [place] [address], [place], [place] [address]      Radiology Studies: Dg Chest 2 View  Result Date: 07/12/2019 CLINICAL DATA:  Patient with abnormalities on previous chest x-ray. EXAM: CHEST - 2 VIEW COMPARISON:  Chest x-ray 07/10/2019 FINDINGS: Marked cardiac enlargement similar to prior study accounting for decreased lung volume seen on the current exam. Hilar fullness is accentuated also by low depth of expansion. Basilar opacities are noted bilaterally. Interstitial markings are increased with pulmonary vascular congestion. In the right lung base at the site previously obscured by cardiac lead is a nodular density with rounded morphology. This measures approximately 2.3 cm. IMPRESSION: 1. Stable marked cardiac enlargement with pulmonary vascular congestion. Question mild edema with diminishing lung volumes, consider correlation with signs of heart failure or volume overload. 2. Basilar opacities may represent atelectasis or infection. 3. Area of density with rounded morphology in the right lower lobe along with right hilar fullness. Consider CT of the chest  when the patient is able for further assessment to exclude nodule with hilar adenopathy. Electronically Signed   By: Zetta Bills M.D.   On: 07/12/2019 17:56   Scheduled Meds: . allopurinol  150 mg Oral BID  . amLODipine  10 mg Oral Daily  . apixaban  5 mg Oral BID  . fentaNYL      . flumazenil      . hydroxyurea  500 mg Oral BID WC  . metoprolol tartrate  12.5 mg Oral BID  . midazolam      . mometasone-formoterol  2 puff Inhalation BID  . naloxone      . pantoprazole  40 mg Oral Daily  . pravastatin  20 mg Oral Daily   Continuous Infusions: . sodium chloride 10 mL/hr at 07/13/19 0600  . sodium chloride       LOS: 3 days   Time spent: 35 minutes  Kanae Ignatowski JohnsonMD How to contact the [facility] Attending or Consulting provider [place] or covering provider during after hours [place], for this patient?  1. Check the care team in [facility]  and look for a) attending/consulting [facility] provider listed and b) the [facility] team listed 2. Log into [email or web address] and use [place]'s universal password to access. If you do not have the password, please contact the hospital operator. 3. Locate the [facility] provider you are looking for under [facility] and page to a number that you can be directly reached. 4. If you still have difficulty reaching the provider, please page the [facility] (Director on Call) for the Hospitalists listed on amion for assistance.

## 2019-07-14 ENCOUNTER — Inpatient Hospital Stay (HOSPITAL_COMMUNITY): Payer: PPO

## 2019-07-14 ENCOUNTER — Other Ambulatory Visit: Payer: Self-pay

## 2019-07-14 DIAGNOSIS — E883 Tumor lysis syndrome: Secondary | ICD-10-CM

## 2019-07-14 LAB — FOLATE RBC
Folate, Hemolysate: 262 ng/mL
Folate, RBC: 946 ng/mL (ref >498)
Hematocrit: 27.7 % — ABNORMAL LOW (ref 34.0–46.6)

## 2019-07-14 LAB — COMPREHENSIVE METABOLIC PANEL
ALT: 12 U/L (ref 0–44)
AST: 15 U/L (ref 15–41)
Albumin: 3.1 g/dL — ABNORMAL LOW (ref 3.5–5.0)
Alkaline Phosphatase: 328 U/L — ABNORMAL HIGH (ref 38–126)
Anion gap: 8 (ref 5–15)
BUN: 14 mg/dL (ref 8–23)
CO2: 26 mmol/L (ref 22–32)
Calcium: 9.1 mg/dL (ref 8.9–10.3)
Chloride: 99 mmol/L (ref 98–111)
Creatinine, Ser: 0.99 mg/dL (ref 0.44–1.00)
GFR calc Af Amer: >60 mL/min (ref >60)
GFR calc non Af Amer: 55 mL/min — ABNORMAL LOW (ref >60)
Glucose, Bld: 102 mg/dL — ABNORMAL HIGH (ref 70–99)
Potassium: 4 mmol/L (ref 3.5–5.1)
Sodium: 133 mmol/L — ABNORMAL LOW (ref 135–145)
Total Bilirubin: 0.8 mg/dL (ref 0.3–1.2)
Total Protein: 7.6 g/dL (ref 6.5–8.1)

## 2019-07-14 LAB — CBC WITH DIFFERENTIAL/PLATELET
Abs Immature Granulocytes: 2.3 10*3/uL — ABNORMAL HIGH (ref 0.00–0.07)
Band Neutrophils: 11 %
Basophils Absolute: 0 10*3/uL (ref 0.0–0.1)
Basophils Relative: 0 %
Eosinophils Absolute: 0 10*3/uL (ref 0.0–0.5)
Eosinophils Relative: 0 %
HCT: 29.2 % — ABNORMAL LOW (ref 36.0–46.0)
Hemoglobin: 9.2 g/dL — ABNORMAL LOW (ref 12.0–15.0)
Lymphocytes Relative: 3 %
Lymphs Abs: 3.4 10*3/uL (ref 0.7–4.0)
MCH: 31.2 pg (ref 26.0–34.0)
MCHC: 31.5 g/dL (ref 30.0–36.0)
MCV: 99 fL (ref 80.0–100.0)
Monocytes Absolute: 2.3 10*3/uL — ABNORMAL HIGH (ref 0.1–1.0)
Monocytes Relative: 2 %
Myelocytes: 2 %
Neutro Abs: 106.2 10*3/uL — ABNORMAL HIGH (ref 1.7–7.7)
Neutrophils Relative %: 82 %
Platelets: 138 10*3/uL — ABNORMAL LOW (ref 150–400)
RBC: 2.95 MIL/uL — ABNORMAL LOW (ref 3.87–5.11)
RDW: 15.3 % (ref 11.5–15.5)
WBC: 114.2 10*3/uL (ref 4.0–10.5)
nRBC: 0 % (ref 0.0–0.2)

## 2019-07-14 LAB — PHOSPHORUS: Phosphorus: 4 mg/dL (ref 2.5–4.6)

## 2019-07-14 LAB — URIC ACID: Uric Acid, Serum: 4.7 mg/dL (ref 2.5–7.1)

## 2019-07-14 MED ORDER — METOPROLOL TARTRATE 25 MG PO TABS
25.0000 mg | ORAL_TABLET | Freq: Two times a day (BID) | ORAL | Status: DC
  Administered 2019-07-14 – 2019-07-22 (×18): 25 mg via ORAL
  Filled 2019-07-14 (×18): qty 1

## 2019-07-14 NOTE — Progress Notes (Signed)
Pt too large for MRI on closed bore scanner. Pt's hips wont clear bore circumference, Pt will need wide bore scanner if MRI is still warranted.

## 2019-07-14 NOTE — Progress Notes (Signed)
PROGRESS NOTE    Erika [name]  DPT:[id number] DOB: 1940/10/31 DOA: 07/10/2019 PCP: [name], MD  Outpatient Specialists:   Brief Narrative:   Patient is a 78 year old African-American female, morbidly obese with BMI of 48 kg/m, and past medical history significant for myeloproliferative neoplasm NOS-didnt see oncology for 7-64month, hypertension, asthma, paroxysmal atrial fibrillation and status post resection of lumbar spine mass.  Patient also has history of chronic lower back pain, with possible radiculopathy.  Patient presented with worsening right-sided lower back pain and the inability to ambulate over 2 days. -In the emergency room she was noted to have a white count of 107 compared to previous level of 61, creatinine of 1.4, uric acid of 14, ESR of 70, CT pelvis nonspecific, question septic arthritis versus sacroiliitis, seen by orthopedics did not feel this was consistent with septic arthritis -Seen by oncology for profound leukocytosis, concern for tumor lysis syndrome -Underwent bone marrow aspiration biopsy 12/7  Assessment & Plan:   Severe back pain/inability to ambulate/possible left-sided sacroiliitis versus septic left sacral iliac joint: -Patient has acute on chronic left hip and lower back pain along with radiculopathy -She was treated for paraspinal/lumbar TB in 2002, and also underwent hemilaminectomy of L4-L5 and S1, decompression of tumor and epidural space, which was later noted to be granulomatous disease from TB -Not have constitutional symptoms, fever chills etc., she has a chronically elevated ESR which could be explained by her progressive myelopathy proliferative neoplasm -Seen by orthopedics in consultation, infection felt to be less likely -However given worsening symptoms, limitations in ambulation and complex history of TB in her LS spine will obtain MRI of this and her left hip -PT OT -Pain control  Severe leukocytosis, myeloproliferative Neoplasm  NOS: -WBC 107 on admission,  -Oncology Dr. KIrene Limbo following -Follow-up bone marrow biopsy, concern for MDS versus transformation to acute leukemia  Elevated uric acid level/tumor lysis syndrome -Uric acid is 14.1. -Calcium, potassium within normal range, kidney function improving  -Continue daily allopurinol, follow uric acid levels daily  Paroxysmal atrial fibrillation: Heart rate is controlled Continue current medication (patient is on metoprolol 12.5 mg p.o. twice daily and apixaban 5 mg p.o. twice daily).  Morbid obesity: -Needs aggressive lifestyle modification  Possible lower lobe infiltrates as per chest x-ray: -No symptoms of pneumonia, afebrile, monitor  Acute Kidney injury: -Improved with hydration, creatinine down to 0.9  DVT prophylaxis: Apixaban. Code Status: Full code Family Communication: none at bedside Disposition Plan: to be determined, may need rehab   Consultants:   Hematology/oncology  Orthopedics   I.R.   Procedures:  Bone Marrow biopsy scheduled for 07/13/19 Antimicrobials:   IV vancomycin  IV ceftriaxone started on 07/11/2019.  IV cefepime discontinued   Subjective: -Continues to report low back, left hip pain and limited mobility for couple of days  Objective: Vitals:   07/14/19 0123 07/14/19 0644 07/14/19 0711 07/14/19 1254  BP: 139/88 (!) 127/47  (!) 156/70  Pulse: 98 100  (!) 112  Resp: 17 16  18   Temp: 97.9 F (36.6 C) 98.3 F (36.8 C)  98.4 F (36.9 C)  TempSrc: Oral Oral  Oral  SpO2: 92% 91% 92% 91%  Weight:      Height:        Intake/Output Summary (Last 24 hours) at 07/14/2019 1427 Last data filed at 07/14/2019 0945 Gross per 24 hour  Intake 480 ml  Output --  Net 480 ml   Filed Weights   07/10/19 1639 07/11/19 1119 07/13/19 1058  Weight: 134.7 kg 135.1 kg 136 kg    Examination:  Gen: Morbidly obese pleasant female sitting up in bed awake, Alert, Oriented X 3,, no distress HEENT: PERRLA, Neck supple, no  JVD Lungs: Decreased breath sounds at bases CVS: RRR,No Gallops,Rubs or new Murmurs Abd: soft, Non tender, non distended, BS present Extremities: No edema, left hip pain with flexion, adduction and abduction Skin: no new rashes  Data Reviewed: I have personally reviewed following labs and imaging studies  CBC: Recent Labs  Lab 07/10/19 1735 07/11/19 0500 07/12/19 0129 07/13/19 0358 07/14/19 0542  WBC 107.4* 92.2* 105.4* 109.5* 114.2*  NEUTROABS 93.5*  --  90.5* 93.2* 106.2*  HGB 10.0* 9.1* 8.7* 9.1* 9.2*  HCT 30.9* 29.7* 28.0* 29.3* 29.2*  MCV 98.7 99.3 99.3 100.0 99.0  PLT 145* 129* 139* 131* 151*   Basic Metabolic Panel: Recent Labs  Lab 07/10/19 1735 07/11/19 1455 07/12/19 0129 07/13/19 0358 07/14/19 0542  NA 135  --  135 138   137 133*  K 4.4  --  4.2 4.1   4.0 4.0  CL 96*  --  102 102   103 99  CO2 23  --  21* 23   23 26   GLUCOSE 93  --  76 62*   63* 102*  BUN 26*  --  23 12   12 14   CREATININE 1.49*  --  1.31* 0.90   0.90 0.99  CALCIUM 9.4  --  8.8* 9.4   9.3 9.1  MG  --  1.8 2.0 1.8  --   PHOS  --  4.2  --  3.4 4.0   GFR: Estimated Creatinine Clearance: 68.7 mL/min (by C-G formula based on SCr of 0.99 mg/dL). Liver Function Tests: Recent Labs  Lab 07/10/19 1735 07/11/19 1455 07/12/19 0129 07/13/19 0358 07/14/19 0542  AST 19  --  18 16 15   ALT 15  --  11 11 12   ALKPHOS 331*  --  274* 320* 328*  BILITOT 0.4  --  0.7 0.4 0.8  PROT 8.3*  --  7.6 7.3 7.6  ALBUMIN 3.7 3.1* 3.4* 2.9*   2.9* 3.1*   No results for input(s): LIPASE, AMYLASE in the last 168 hours. No results for input(s): AMMONIA in the last 168 hours. Coagulation Profile: Recent Labs  Lab 07/10/19 1735 07/13/19 0358  INR 1.3* 1.2   Cardiac Enzymes: Recent Labs  Lab 07/11/19 1455  CKTOTAL 161   BNP (last 3 results) No results for input(s): PROBNP in the last 8760 hours. HbA1C: No results for input(s): HGBA1C in the last 72 hours. CBG: No results for input(s): GLUCAP in the  last 168 hours. Lipid Profile: No results for input(s): CHOL, HDL, LDLCALC, TRIG, CHOLHDL, LDLDIRECT in the last 72 hours. Thyroid Function Tests: No results for input(s): TSH, T4TOTAL, FREET4, T3FREE, THYROIDAB in the last 72 hours. Anemia Panel: Recent Labs    07/12/19 0129  VITAMINB12 448  FERRITIN 152  TIBC 339  IRON 46  RETICCTPCT 1.7   Urine analysis:    Component Value Date/Time   COLORURINE YELLOW 07/11/2019 1443   APPEARANCEUR CLOUDY (A) 07/11/2019 1443   LABSPEC 1.021 07/11/2019 1443   PHURINE 5.0 07/11/2019 1443   GLUCOSEU NEGATIVE 07/11/2019 1443   HGBUR MODERATE (A) 07/11/2019 1443   BILIRUBINUR NEGATIVE 07/11/2019 1443   KETONESUR 5 (A) 07/11/2019 1443   PROTEINUR NEGATIVE 07/11/2019 1443   UROBILINOGEN 1.0 09/09/2012 0051   NITRITE NEGATIVE 07/11/2019 1443   LEUKOCYTESUR TRACE (A) 07/11/2019  1443    Recent Results (from the past 240 hour(s))  SARS CORONAVIRUS 2 (TAT 6-24 HRS) Nasopharyngeal Nasopharyngeal Swab     Status: None   Collection Time: 07/10/19  9:21 PM   Specimen: Nasopharyngeal Swab  Result Value Ref Range Status   SARS Coronavirus 2 NEGATIVE NEGATIVE Final    Comment: (NOTE) SARS-CoV-2 target nucleic acids are NOT DETECTED. The SARS-CoV-2 RNA is generally detectable in upper and lower respiratory specimens during the acute phase of infection. Negative results do not preclude SARS-CoV-2 infection, do not rule out co-infections with other pathogens, and should not be used as the sole basis for treatment or other patient management decisions. Negative results must be combined with clinical observations, patient history, and epidemiological information. The expected result is Negative. Fact Sheet for Patients: SugarRoll.be Fact Sheet for Healthcare Providers: https://www.woods-mathews.com/ This test is not yet approved or cleared by the [place] FDA and  has been authorized for detection and/or  diagnosis of SARS-CoV-2 by FDA under an Emergency Use Authorization (EUA). This EUA will remain  in effect (meaning this test can be used) for the duration of the COVID-19 declaration under Section 56 4(b)(1) of the Act, 21 U.S.C. section 360bbb-3(b)(1), unless the authorization is terminated or revoked sooner. Performed at [facility], [place] [address], [place], [place] [address]      Radiology Studies: Dg Chest 2 View  Result Date: 07/12/2019 CLINICAL DATA:  Patient with abnormalities on previous chest x-ray. EXAM: CHEST - 2 VIEW COMPARISON:  Chest x-ray 07/10/2019 FINDINGS: Marked cardiac enlargement similar to prior study accounting for decreased lung volume seen on the current exam. Hilar fullness is accentuated also by low depth of expansion. Basilar opacities are noted bilaterally. Interstitial markings are increased with pulmonary vascular congestion. In the right lung base at the site previously obscured by cardiac lead is a nodular density with rounded morphology. This measures approximately 2.3 cm. IMPRESSION: 1. Stable marked cardiac enlargement with pulmonary vascular congestion. Question mild edema with diminishing lung volumes, consider correlation with signs of heart failure or volume overload. 2. Basilar opacities may represent atelectasis or infection. 3. Area of density with rounded morphology in the right lower lobe along with right hilar fullness. Consider CT of the chest when the patient is able for further assessment to exclude nodule with hilar adenopathy. Electronically Signed   By: Zetta Bills M.D.   On: 07/12/2019 17:56   Ct Biopsy  Result Date: 07/13/2019 INDICATION: History of MPN and CHEK 2 deletion now with progression of new anemia, thrombocytopenia and leukocytosis. Please perform CT-guided bone marrow biopsy for tissue diagnostic purposes. Note, questioned left-sided sacroiliitis with identified on recent pelvic CT and as such the decision was made to  proceed with bone marrow biopsy of the right iliac crest. EXAM: CT-GUIDED BONE MARROW BIOPSY AND ASPIRATION MEDICATIONS: None ANESTHESIA/SEDATION: Fentanyl 150 mcg IV; Versed 3 mg IV Sedation Time: 10 Minutes; The patient was continuously monitored during the procedure by the interventional radiology nurse under my direct supervision. COMPLICATIONS: None immediate. PROCEDURE: Informed consent was obtained from the patient following an explanation of the procedure, risks, benefits and alternatives. The patient understands, agrees and consents for the procedure. All questions were addressed. A time out was performed prior to the initiation of the procedure. The patient was positioned prone and non-contrast localization CT was performed of the pelvis to demonstrate the iliac marrow spaces. The operative site was prepped and draped in the usual sterile fashion. Under sterile conditions and  local anesthesia, a 22 gauge spinal needle was utilized for procedural planning. Next, an 11 gauge coaxial bone biopsy needle was advanced into the right iliac marrow space. Needle position was confirmed with CT imaging. Initially, bone marrow aspiration was performed. Next, a bone marrow biopsy was obtained with the 11 gauge outer bone marrow device. Samples were prepared with the cytotechnologist and deemed adequate. The needle was removed intact. Hemostasis was obtained with compression and a dressing was placed. The patient tolerated the procedure well without immediate post procedural complication. IMPRESSION: Successful CT guided right iliac bone marrow aspiration and core biopsy. Electronically Signed   By: Sandi Mariscal M.D.   On: 07/13/2019 11:06   Dg Chest Port 1 View  Result Date: 07/14/2019 CLINICAL DATA:  Pulmonary nodule. EXAM: PORTABLE CHEST 1 VIEW COMPARISON:  Radiograph 07/12/2019. CT 05/31/2017 FINDINGS: Unchanged cardiomegaly. Unchanged mediastinal contours. The rounded density at the right lung base on prior  radiograph is not seen currently. Pulmonary vascular congestion is similar to prior. Streaky bibasilar atelectasis. No new airspace disease or pneumothorax. No large pleural effusion. IMPRESSION: 1. Unchanged cardiomegaly and pulmonary vascular congestion. 2. The rounded density at the right lung base on prior radiograph is not seen currently. 3. Streaky bibasilar atelectasis. No new abnormality. Electronically Signed   By: Keith Rake M.D.   On: 07/14/2019 05:41   Ct Bone Marrow Biopsy & Aspiration  Result Date: 07/13/2019 INDICATION: History of MPN and CHEK 2 deletion now with progression of new anemia, thrombocytopenia and leukocytosis. Please perform CT-guided bone marrow biopsy for tissue diagnostic purposes. Note, questioned left-sided sacroiliitis with identified on recent pelvic CT and as such the decision was made to proceed with bone marrow biopsy of the right iliac crest. EXAM: CT-GUIDED BONE MARROW BIOPSY AND ASPIRATION MEDICATIONS: None ANESTHESIA/SEDATION: Fentanyl 150 mcg IV; Versed 3 mg IV Sedation Time: 10 Minutes; The patient was continuously monitored during the procedure by the interventional radiology nurse under my direct supervision. COMPLICATIONS: None immediate. PROCEDURE: Informed consent was obtained from the patient following an explanation of the procedure, risks, benefits and alternatives. The patient understands, agrees and consents for the procedure. All questions were addressed. A time out was performed prior to the initiation of the procedure. The patient was positioned prone and non-contrast localization CT was performed of the pelvis to demonstrate the iliac marrow spaces. The operative site was prepped and draped in the usual sterile fashion. Under sterile conditions and local anesthesia, a 22 gauge spinal needle was utilized for procedural planning. Next, an 11 gauge coaxial bone biopsy needle was advanced into the right iliac marrow space. Needle position was confirmed  with CT imaging. Initially, bone marrow aspiration was performed. Next, a bone marrow biopsy was obtained with the 11 gauge outer bone marrow device. Samples were prepared with the cytotechnologist and deemed adequate. The needle was removed intact. Hemostasis was obtained with compression and a dressing was placed. The patient tolerated the procedure well without immediate post procedural complication. IMPRESSION: Successful CT guided right iliac bone marrow aspiration and core biopsy. Electronically Signed   By: Sandi Mariscal M.D.   On: 07/13/2019 11:06   Scheduled Meds:  allopurinol  150 mg Oral BID   amLODipine  10 mg Oral Daily   apixaban  5 mg Oral BID   hydroxyurea  500 mg Oral BID WC   metoprolol tartrate  25 mg Oral BID   mometasone-formoterol  2 puff Inhalation BID   pantoprazole  40 mg Oral Daily  pravastatin  20 mg Oral Daily   Continuous Infusions:    LOS: 4 days   Time spent: 35 minutes  1. Josetta Wigal JosephMD

## 2019-07-15 ENCOUNTER — Ambulatory Visit (HOSPITAL_COMMUNITY): Payer: PPO

## 2019-07-15 ENCOUNTER — Ambulatory Visit (HOSPITAL_COMMUNITY)
Admit: 2019-07-15 | Discharge: 2019-07-15 | Disposition: A | Discharge status: Home / Self Care | Payer: PPO | Attending: Internal Medicine | Admitting: Internal Medicine

## 2019-07-15 ENCOUNTER — Ambulatory Visit (HOSPITAL_COMMUNITY): Admit: 2019-07-15 | Payer: PPO

## 2019-07-15 DIAGNOSIS — M5126 Other intervertebral disc displacement, lumbar region: Secondary | ICD-10-CM | POA: Diagnosis not present

## 2019-07-15 DIAGNOSIS — M25452 Effusion, left hip: Secondary | ICD-10-CM | POA: Diagnosis not present

## 2019-07-15 DIAGNOSIS — M48061 Spinal stenosis, lumbar region without neurogenic claudication: Secondary | ICD-10-CM | POA: Diagnosis not present

## 2019-07-15 LAB — COMPREHENSIVE METABOLIC PANEL
ALT: 11 U/L (ref 0–44)
AST: 14 U/L — ABNORMAL LOW (ref 15–41)
Albumin: 2.7 g/dL — ABNORMAL LOW (ref 3.5–5.0)
Alkaline Phosphatase: 237 U/L — ABNORMAL HIGH (ref 38–126)
Anion gap: 10 (ref 5–15)
BUN: 17 mg/dL (ref 8–23)
CO2: 25 mmol/L (ref 22–32)
Calcium: 9.3 mg/dL (ref 8.9–10.3)
Chloride: 99 mmol/L (ref 98–111)
Creatinine, Ser: 0.94 mg/dL (ref 0.44–1.00)
GFR calc Af Amer: >60 mL/min (ref >60)
GFR calc non Af Amer: 59 mL/min — ABNORMAL LOW (ref >60)
Glucose, Bld: 93 mg/dL (ref 70–99)
Potassium: 4.1 mmol/L (ref 3.5–5.1)
Sodium: 134 mmol/L — ABNORMAL LOW (ref 135–145)
Total Bilirubin: 0.5 mg/dL (ref 0.3–1.2)
Total Protein: 7.4 g/dL (ref 6.5–8.1)

## 2019-07-15 LAB — SURGICAL PATHOLOGY

## 2019-07-15 MED ORDER — GADOBUTROL 1 MMOL/ML IV SOLN
10.0000 mL | Freq: Once | INTRAVENOUS | Status: AC | PRN
  Administered 2019-07-15: 10 mL via INTRAVENOUS

## 2019-07-15 NOTE — Progress Notes (Signed)
PROGRESS NOTE    Erika [name]  HQI:[id number] DOB: [birth year] DOA: 07/10/2019 PCP: [name], MD  Outpatient Specialists:   Brief Narrative:   Patient is a 78 year old African-American female, morbidly obese with BMI of 48 kg/m, and past medical history significant for myeloproliferative neoplasm NOS-didnt see oncology for 7-66month, hypertension, asthma, paroxysmal atrial fibrillation and status post resection of lumbar spine mass.  Patient also has history of chronic lower back pain, with possible radiculopathy.  Patient presented with worsening right-sided lower back pain and the inability to ambulate over 2 days. -In the emergency room she was noted to have a white count of 107 compared to previous level of 61, creatinine of 1.4, uric acid of 14, ESR of 70, CT pelvis nonspecific, question septic arthritis versus sacroiliitis, seen by orthopedics did not feel this was consistent with septic arthritis -Seen by oncology for profound leukocytosis, concern for tumor lysis syndrome -Underwent bone marrow aspiration biopsy 12/7  Assessment & Plan:   Severe back pain/inability to ambulate/possible left-sided sacroiliitis versus septic left sacral iliac joint: -Patient has acute on chronic left hip and lower back pain along with radiculopathy -She was treated for paraspinal/lumbar TB in 2002, and also underwent hemilaminectomy of L4-L5 and S1, decompression of tumor and epidural space, which 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  -Seen by Orthopedics in consultation, infection felt to be less likely -However, given her worsening symptoms, limitations in ambulation and complex history of TB in her LS spine agree with MRI spine and left hip with and without contrast. [facility]  -PT OT -Pain control -If infectious process ruled out, may have a trial of medrol (she reports feeling anxious and hyper with prednisone that she took for asthma in the past but is not sure if she has had medrol dose pack).  -Would also try tizanidine as needed as muscle relaxer.  -Continue heating pad, tylenol as needed.   Severe leukocytosis, myeloproliferative Neoplasm NOS: -WBC 107 on presentation,  -Oncology Dr. KIrene Limbo following -Follow-up bone marrow biopsy, concern for MDS versus transformation to acute leukemia.  Elevated uric acid level/tumor lysis syndrome -Uric acid is 14.1. -Calcium, potassium within normal range, kidney function improving  -Continue daily allopurinol, follow uric acid levels daily  Paroxysmal atrial fibrillation: Heart rate is controlled Continue current medication (patient is on metoprolol 12.5 mg p.o. twice daily and apixaban 5 mg p.o. twice daily).  Morbid obesity: -Needs aggressive lifestyle modification  Possible lower lobe infiltrates as per chest x-ray: -No symptoms of pneumonia, afebrile, monitor  Acute Kidney injury: -Improved with hydration, creatinine down to 0.9  DVT prophylaxis: Apixaban. Code Status: Full code Family Communication: none at bedside Disposition Plan: to be determined, may need rehab   Consultants:   Hematology/oncology  Orthopedics   I.R.   Procedures:  Bone Marrow biopsy scheduled for 07/13/19 Antimicrobials:   IV vancomycin 12/5-12/6  IV ceftriaxone  07/11/2019-07/12/2019.  IV cefepime discontinued on 12/6   Subjective: Reports that her back and hip do not hurt as long as she does not move.   Objective: Vitals:   07/15/19 0508 07/15/19 0920 07/15/19 0955 07/15/19 1224  BP: 136/82  (!) 157/66 124/61  Pulse: (!) 101  (!) 109 96  Resp: 18  18  Temp: 98.8 F (37.1 C)   98.9 F (37.2 C)  TempSrc: Oral   Oral  SpO2: 93% 91%  94%  Weight:       Height:        Intake/Output Summary (Last 24 hours) at 07/15/2019 1412 Last data filed at 07/15/2019 0900 Gross per 24 hour  Intake 480 ml  Output 1201 ml  Net -721 ml   Filed Weights   07/10/19 1639 07/11/19 1119 07/13/19 1058  Weight: 134.7 kg 135.1 kg 136 kg    Examination:  Gen: Morbidly obese pleasant female sitting up in bed awake, Alert, Oriented X 3, no distress. Asking and answering questions appropriately.  HEENT: Neck supple, no JVD.  Lungs: No respiratory distress. No wheezing.  CVS: RRR Abd: soft, Non tender, non distended Extremities: No edema, left hip pain with flexion, adduction and abduction, limited ROM due to pain Skin: no new rashes  Data Reviewed: I have personally reviewed following labs and imaging studies  CBC: Recent Labs  Lab 07/10/19 1735 07/11/19 0500 07/12/19 0129 07/13/19 0358 07/14/19 0542  WBC 107.4* 92.2* 105.4* 109.5* 114.2*  NEUTROABS 93.5*  --  90.5* 93.2* 106.2*  HGB 10.0* 9.1* 8.7* 9.1* 9.2*  HCT 30.9* 29.7* 28.0*  27.7* 29.3* 29.2*  MCV 98.7 99.3 99.3 100.0 99.0  PLT 145* 129* 139* 131* 563*   Basic Metabolic Panel: Recent Labs  Lab 07/10/19 1735 07/11/19 1455 07/12/19 0129 07/13/19 0358 07/14/19 0542 07/15/19 0611  NA 135  --  135 138  137 133* 134*  K 4.4  --  4.2 4.1  4.0 4.0 4.1  CL 96*  --  102 102  103 99 99  CO2 23  --  21* _0 GLUCOSE 93  --  76 62*  63* 102* 93  BUN 26*  --  _1 CREATININE 1.49*  --  1.31* 0.90  0.90 0.99 0.94  CALCIUM 9.4  --  8.8* 9.4  9.3 9.1 9.3  MG  --  1.8 2.0 1.8  --   --   PHOS  --  4.2  --  3.4 4.0  --    GFR: Estimated Creatinine Clearance: 72.3 mL/min (by C-G formula based on SCr of 0.94 mg/dL). Liver Function Tests: Recent Labs  Lab 07/10/19 1735 07/11/19 1455 07/12/19 0129 07/13/19 0358 07/14/19 0542 07/15/19 0611  AST 19  --  _2 14*  ALT 15  --  _3 ALKPHOS 331*  --  274* 320* 328* 237*  BILITOT 0.4  --  0.7 0.4 0.8  0.5  PROT 8.3*  --  7.6 7.3 7.6 7.4  ALBUMIN 3.7 3.1* 3.4* 2.9*  2.9* 3.1* 2.7*   No results for input(s): LIPASE, AMYLASE in the last 168 hours. No results for input(s): AMMONIA in the last 168 hours. Coagulation Profile: Recent Labs  Lab 07/10/19 1735 07/13/19 0358  INR 1.3* 1.2   Cardiac Enzymes: Recent Labs  Lab 07/11/19 1455  CKTOTAL 161   BNP (last 3 results) No results for input(s): PROBNP in the last 8760 hours. HbA1C: No results for input(s): HGBA1C in the last 72 hours. CBG: No results for input(s): GLUCAP in the last 168 hours. Lipid Profile: No results for input(s): CHOL, HDL, LDLCALC, TRIG, CHOLHDL, LDLDIRECT in the last 72 hours. Thyroid Function Tests: No results for input(s): TSH, T4TOTAL, FREET4, T3FREE, THYROIDAB in the last 72 hours. Anemia Panel: No  results for input(s): VITAMINB12, FOLATE, FERRITIN, TIBC, IRON, RETICCTPCT in the last 72 hours. Urine analysis:    Component Value Date/Time   COLORURINE YELLOW 07/11/2019 1443   APPEARANCEUR CLOUDY (A) 07/11/2019 1443   LABSPEC 1.021 07/11/2019 1443   PHURINE 5.0 07/11/2019 1443   GLUCOSEU NEGATIVE 07/11/2019 1443   HGBUR MODERATE (A) 07/11/2019 1443   BILIRUBINUR NEGATIVE 07/11/2019 1443   KETONESUR 5 (A) 07/11/2019 1443   PROTEINUR NEGATIVE 07/11/2019 1443   UROBILINOGEN 1.0 09/09/2012 0051   NITRITE NEGATIVE 07/11/2019 1443   LEUKOCYTESUR TRACE (A) 07/11/2019 1443    Recent Results (from the past 240 hour(s))  SARS CORONAVIRUS 2 (TAT 6-24 HRS) Nasopharyngeal Nasopharyngeal Swab     Status: None   Collection Time: 07/10/19  9:21 PM   Specimen: Nasopharyngeal Swab  Result Value Ref Range Status   SARS Coronavirus 2 NEGATIVE NEGATIVE Final    Comment: (NOTE) SARS-CoV-2 target nucleic acids are NOT DETECTED. The SARS-CoV-2 RNA is generally detectable in upper and lower respiratory specimens during the acute phase of infection. Negative results do not preclude SARS-CoV-2 infection, do not rule  out co-infections with other pathogens, and should not be used as the sole basis for treatment or other patient management decisions. Negative results must be combined with clinical observations, patient history, and epidemiological information. The expected result is Negative. Fact Sheet for Patients: SugarRoll.be Fact Sheet for Healthcare Providers: https://www.woods-mathews.com/ This test is not yet approved or cleared by the [place] FDA and  has been authorized for detection and/or diagnosis of SARS-CoV-2 by FDA under an Emergency Use Authorization (EUA). This EUA will remain  in effect (meaning this test can be used) for the duration of the COVID-19 declaration under Section 56 4(b)(1) of the Act, 21 U.S.C. section 360bbb-3(b)(1), unless the authorization is terminated or revoked sooner. Performed at [facility], [place] [address], [place], [place] [address]      Radiology Studies: Dg Chest Port 1 View  Result Date: 07/14/2019 CLINICAL DATA:  Pulmonary nodule. EXAM: PORTABLE CHEST 1 VIEW COMPARISON:  Radiograph 07/12/2019. CT 05/31/2017 FINDINGS: Unchanged cardiomegaly. Unchanged mediastinal contours. The rounded density at the right lung base on prior radiograph is not seen currently. Pulmonary vascular congestion is similar to prior. Streaky bibasilar atelectasis. No new airspace disease or pneumothorax. No large pleural effusion. IMPRESSION: 1. Unchanged cardiomegaly and pulmonary vascular congestion. 2. The rounded density at the right lung base on prior radiograph is not seen currently. 3. Streaky bibasilar atelectasis. No new abnormality. Electronically Signed   By: Keith Rake M.D.   On: 07/14/2019 05:41   Scheduled Meds: . allopurinol  150 mg Oral BID  . amLODipine  10 mg Oral Daily  . apixaban  5 mg Oral BID  . hydroxyurea  500 mg Oral BID WC  . metoprolol tartrate  25 mg Oral BID  . mometasone-formoterol  2 puff  Inhalation BID  . pantoprazole  40 mg Oral Daily  . pravastatin  20 mg Oral Daily   Continuous Infusions:    LOS: 5 days   Time spent: 35 minutes  1. Sinclair Grooms KennerlyMD

## 2019-07-16 LAB — COMPREHENSIVE METABOLIC PANEL
ALT: 11 U/L (ref 0–44)
AST: 15 U/L (ref 15–41)
Albumin: 2.8 g/dL — ABNORMAL LOW (ref 3.5–5.0)
Alkaline Phosphatase: 238 U/L — ABNORMAL HIGH (ref 38–126)
Anion gap: 10 (ref 5–15)
BUN: 20 mg/dL (ref 8–23)
CO2: 25 mmol/L (ref 22–32)
Calcium: 9 mg/dL (ref 8.9–10.3)
Chloride: 97 mmol/L — ABNORMAL LOW (ref 98–111)
Creatinine, Ser: 0.95 mg/dL (ref 0.44–1.00)
GFR calc Af Amer: >60 mL/min (ref >60)
GFR calc non Af Amer: 58 mL/min — ABNORMAL LOW (ref >60)
Glucose, Bld: 95 mg/dL (ref 70–99)
Potassium: 4 mmol/L (ref 3.5–5.1)
Sodium: 132 mmol/L — ABNORMAL LOW (ref 135–145)
Total Bilirubin: 0.7 mg/dL (ref 0.3–1.2)
Total Protein: 7.5 g/dL (ref 6.5–8.1)

## 2019-07-16 MED ORDER — POLYETHYLENE GLYCOL 3350 17 G PO PACK
17.0000 g | PACK | Freq: Every day | ORAL | Status: DC
  Administered 2019-07-23 – 2019-07-25 (×3): 17 g via ORAL
  Filled 2019-07-16 (×11): qty 1

## 2019-07-16 MED ORDER — TIZANIDINE HCL 4 MG PO TABS
4.0000 mg | ORAL_TABLET | Freq: Three times a day (TID) | ORAL | Status: DC | PRN
  Administered 2019-07-17 – 2019-07-28 (×10): 4 mg via ORAL
  Filled 2019-07-16 (×10): qty 1

## 2019-07-16 NOTE — Progress Notes (Signed)
Patient transferring to 1417 per ordered. Multiple attempts made to give report via phone. Receiving nurse will call when ready. Phone number given to staff.

## 2019-07-16 NOTE — Progress Notes (Signed)
PROGRESS NOTE    Erika [name]  FXT:[id number] DOB: 26-Sep-1940 DOA: 07/10/2019 PCP: [name], MD  Outpatient Specialists:   Brief Narrative:   Patient is a 78 year old African-American female, morbidly obese with BMI of 48 kg/m, and past medical history significant for myeloproliferative neoplasm NOS-didnt see oncology for 7-46month, hypertension, asthma, paroxysmal atrial fibrillation and status post resection of lumbar spine mass.  Patient also has history of chronic lower back pain, with possible radiculopathy.  Patient presented with worsening right-sided lower back pain and the inability to ambulate over 2 days. -In the emergency room she was noted to have a white count of 107 compared to previous level of 61, creatinine of 1.4, uric acid of 14, ESR of 70, CT pelvis nonspecific, question septic arthritis versus sacroiliitis, seen by orthopedics did not feel this was consistent with septic arthritis -Seen by oncology for profound leukocytosis, concern for tumor lysis syndrome -Underwent bone marrow aspiration biopsy 12/7  Assessment & Plan:   Severe back pain/inability to ambulate/possible left-sided sacroiliitis versus septic left sacral iliac joint: -Patient has acute on chronic left hip and lower back pain along with radiculopathy -She was treated for paraspinal/lumbar TB in 2002, and also underwent hemilaminectomy of L4-L5 and S1, decompression of tumor and epidural space, which 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  -Seen by Orthopedics in consultation, infection felt to be less likely -However, given her worsening symptoms, limitations in ambulation and complex history of TB in her LS spine agree with MRI spine and left hip with and without contrast. [facility]  -MRI of spine negative for evidence of infectious process but left SI joint could not have septic arthritis ruled out.  -Ordered IR CT or FL guided left SI aspiration to rule out septic arthritis. Will hold Eliquis per IR request to hold it for 48 hours prior to procedure. Last dose was on 12/10 at 10 am.   Continue:  -PT OT -Pain control -If infectious process ruled out, may have a trial of medrol (she reports feeling anxious and hyper with prednisone that she took for asthma in the past but is not sure if she has had medrol dose pack).  -Will try tizanidine as needed as muscle relaxer.  -Continue heating pad, tylenol as needed.   Severe leukocytosis, myeloproliferative Neoplasm NOS: -WBC 107 on presentation,  -Oncology Dr. KIrene Limbo following -She is s/p bone marrow biopsy  Elevated uric acid level/tumor lysis syndrome -Uric acid is 14.1. -Calcium, potassium within normal range, kidney function improving  -Continue daily allopurinol, repeat uric acid level in amy  Paroxysmal atrial fibrillation: Heart rate is controlled Continue metoprolol 12.5 mg p.o. twice daily.  Holding apixaban 5 mg p.o. twice daily in anticipation for joint aspirate.   Morbid obesity: -Needs aggressive lifestyle modification  Possible lower lobe infiltrates as per chest x-ray: -No symptoms of pneumonia, afebrile, monitor  Acute Kidney injury: -Improved with hydration, creatinine down to 0.9  DVT prophylaxis: Apixaban (holding for 48 hours prior to procedure), SCDs Code Status: Full code Family Communication: none at bedside Disposition Plan: to be determined, may need rehab   Consultants:   Hematology/oncology  Orthopedics   I.R.   Procedures:  Bone Marrow biopsy scheduled for 07/13/19 Antimicrobials:   IV vancomycin 12/5-12/6  IV ceftriaxone  07/11/2019-07/12/2019.  IV cefepime discontinued on 12/6   Subjective: Patient seen and examined this morning. Reported that  she had bilateral lower extremity weakness with pain in her right ankle but no edema or trauma. No decrease in sensation. Able to plantar and dorsiflex feet.   Objective: Vitals:   07/15/19 2123 07/16/19 0514 07/16/19 0724 07/16/19 1312  BP: 126/65 124/65  (!) 152/72  Pulse: 100 (!) 101  96  Resp: _0 Temp: 98.3 F (36.8 C) 98.3 F (36.8 C)  (!) 97.5 F (36.4 C)  TempSrc: Oral Oral  Oral  SpO2: 94% 96% 90% 95%  Weight:      Height:        Intake/Output Summary (Last 24 hours) at 07/16/2019 1747 Last data filed at 07/16/2019 1529 Gross per 24 hour  Intake 720 ml  Output 600 ml  Net 120 ml   Filed Weights   07/10/19 1639 07/11/19 1119 07/13/19 1058  Weight: 134.7 kg 135.1 kg 136 kg    Examination:  Gen: Morbidly obese pleasant female sitting up in bed awake, Alert, Oriented X 3, no distress.  HEENT: Neck supple, no JVD.  Lungs: No respiratory distress. No wheezing.  CVS: RRR Abd: soft, Non tender, non distended Extremities: No edema, left and right hip pain with abduction, limited ROM, reports that her legs feel stiff. Distal pulses intact, no cyanosis, appropriately warm to touch. No tenderness to palpation.   Skin: no new rashes  Data Reviewed: I have personally reviewed following labs and imaging studies  CBC: Recent Labs  Lab 07/10/19 1735 07/11/19 0500 07/12/19 0129 07/13/19 0358 07/14/19 0542  WBC 107.4* 92.2* 105.4* 109.5* 114.2*  NEUTROABS 93.5*  --  90.5* 93.2* 106.2*  HGB 10.0* 9.1* 8.7* 9.1* 9.2*  HCT 30.9* 29.7* 28.0*  27.7* 29.3* 29.2*  MCV 98.7 99.3 99.3 100.0 99.0  PLT 145* 129* 139* 131* 785*   Basic Metabolic Panel: Recent Labs  Lab 07/11/19 1455 07/12/19 0129 07/13/19 0358 07/14/19 0542 07/15/19 0611 07/16/19 0548  NA  --  135 138  137 133* 134* 132*  K  --  4.2 4.1  4.0 4.0 4.1 4.0  CL  --  102 102  103 99 99 97*  CO2  --  21* _1 GLUCOSE  --  76 62*  63* 102* 93 95  BUN  --  _2 CREATININE  --  1.31* 0.90  0.90 0.99 0.94 0.95  CALCIUM  --  8.8* 9.4  9.3 9.1 9.3 9.0  MG 1.8 2.0 1.8  --   --   --   PHOS 4.2  --  3.4 4.0  --   --    GFR: Estimated Creatinine Clearance: 71.6 mL/min (by C-G formula based on SCr of 0.95 mg/dL). Liver Function Tests: Recent Labs  Lab 07/12/19 0129 07/13/19 0358 07/14/19 0542 07/15/19 0611 07/16/19 0548  AST _3 14* 15  ALT _4 ALKPHOS 274* 320* 328* 237* 238*  BILITOT 0.7 0.4 0.8 0.5 0.7  PROT 7.6 7.3 7.6 7.4 7.5  ALBUMIN 3.4* 2.9*  2.9* 3.1* 2.7* 2.8*   No results for input(s): LIPASE, AMYLASE in the last 168 hours. No results for input(s): AMMONIA in the last 168 hours. Coagulation Profile: Recent Labs  Lab 07/10/19 1735 07/13/19 0358  INR 1.3* 1.2   Cardiac Enzymes: Recent Labs  Lab  07/11/19 1455  CKTOTAL 161   BNP (last 3 results) No results for input(s): PROBNP in the last 8760 hours. HbA1C: No results for input(s): HGBA1C in the last 72 hours. CBG: No results for input(s): GLUCAP in the last 168 hours. Lipid Profile: No results for input(s): CHOL, HDL, LDLCALC, TRIG, CHOLHDL, LDLDIRECT in the last 72 hours. Thyroid Function Tests: No results for input(s): TSH, T4TOTAL, FREET4, T3FREE, THYROIDAB in the last 72 hours. Anemia Panel: No results for input(s): VITAMINB12, FOLATE, FERRITIN, TIBC, IRON, RETICCTPCT in the last 72 hours. Urine analysis:    Component Value Date/Time   COLORURINE YELLOW 07/11/2019 1443   APPEARANCEUR CLOUDY (A) 07/11/2019 1443   LABSPEC 1.021 07/11/2019 1443   PHURINE 5.0 07/11/2019 1443   GLUCOSEU NEGATIVE 07/11/2019 1443   HGBUR MODERATE (A) 07/11/2019 1443   BILIRUBINUR NEGATIVE 07/11/2019 1443   KETONESUR 5 (A) 07/11/2019 1443   PROTEINUR NEGATIVE 07/11/2019 1443   UROBILINOGEN 1.0 09/09/2012 0051   NITRITE NEGATIVE 07/11/2019 1443   LEUKOCYTESUR TRACE (A) 07/11/2019 1443    Recent Results (from the past 240 hour(s))  SARS CORONAVIRUS 2 (TAT  6-24 HRS) Nasopharyngeal Nasopharyngeal Swab     Status: None   Collection Time: 07/10/19  9:21 PM   Specimen: Nasopharyngeal Swab  Result Value Ref Range Status   SARS Coronavirus 2 NEGATIVE NEGATIVE Final    Comment: (NOTE) SARS-CoV-2 target nucleic acids are NOT DETECTED. The SARS-CoV-2 RNA is generally detectable in upper and lower respiratory specimens during the acute phase of infection. Negative results do not preclude SARS-CoV-2 infection, do not rule out co-infections with other pathogens, and should not be used as the sole basis for treatment or other patient management decisions. Negative results must be combined with clinical observations, patient history, and epidemiological information. The expected result is Negative. Fact Sheet for Patients: SugarRoll.be Fact Sheet for Healthcare Providers: https://www.woods-mathews.com/ This test is not yet approved or cleared by the [place] FDA and  has been authorized for detection and/or diagnosis of SARS-CoV-2 by FDA under an Emergency Use Authorization (EUA). This EUA will remain  in effect (meaning this test can be used) for the duration of the COVID-19 declaration under Section 56 4(b)(1) of the Act, 21 U.S.C. section 360bbb-3(b)(1), unless the authorization is terminated or revoked sooner. Performed at [facility], [place] [address] [place],  [address]      Radiology Studies: MR Lumbar Spine W Wo Contrast  Result Date: 07/15/2019 CLINICAL DATA:  Low back pain with left leg weakness. Possible myeloproliferative disorder. EXAM: MRI LUMBAR SPINE WITHOUT AND WITH CONTRAST TECHNIQUE: Multiplanar and multiecho pulse sequences of the lumbar spine were obtained without and with intravenous contrast. CONTRAST:  17m GADAVIST GADOBUTROL 1 MMOL/ML IV SOLN COMPARISON:  None. FINDINGS: Segmentation:  Standard. Alignment:  Physiologic. Vertebrae:  No fracture, evidence of discitis,  or bone lesion. Conus medullaris and cauda equina: Conus extends to the L2 level. Conus and cauda equina appear normal. Paraspinal and other soft tissues: Negative Disc levels: Visualized lower thoracic disc spaces are normal. T12-L1: Normal disc space and facets. No spinal canal or neuroforaminal stenosis. L1-L2: Normal disc space and facets. No spinal canal or neuroforaminal stenosis. L2-L3: Normal disc space and facets. No spinal canal or neuroforaminal stenosis. L3-L4: Small disc bulge with mild spinal canal narrowing. No neural foraminal stenosis. L4-L5: Remote left laminectomy. Mild disc bulge. Mild bilateral foraminal narrowing, unchanged. No spinal canal stenosis. L5-S1: Remote left laminectomy. No spinal canal or neural foraminal stenosis. Unchanged.  Visualized sacrum: Normal. IMPRESSION: 1. Unchanged examination with mild bilateral L4-L5 neural foraminal stenosis. 2. No spinal canal or neural foraminal stenosis. 3. Remote left laminectomy at L4-L5 and L5-S1. Electronically Signed   By: Ulyses Jarred M.D.   On: 07/15/2019 22:26   MR HIP LEFT W WO CONTRAST  Result Date: 07/16/2019 CLINICAL DATA:  Left hip pain.  Pain and weakness. EXAM: MRI OF THE LEFT HIP WITHOUT AND WITH CONTRAST TECHNIQUE: Multiplanar, multisequence MR imaging was performed both before and after administration of intravenous contrast. CONTRAST:  38m GADAVIST GADOBUTROL 1 MMOL/ML IV SOLN COMPARISON:  None. FINDINGS: Bones: No hip fracture, dislocation or avascular necrosis. No periosteal reaction or bone destruction. No aggressive osseous lesion. Joint space narrowing and tiny marginal osteophytes of the right SI joint consistent with mild osteoarthritis. Small right SI joint effusion. Moderate left SI joint effusion with subchondral marrow edema and sclerosis in as well as articular surface irregularity and mild adjacent soft tissue edema. Post biopsy changes in the Degenerative disc disease with disc height loss at L4-5 and L5-S1.  Articular cartilage and labrum Articular cartilage:  No chondral defect. Labrum: Grossly intact, but evaluation is limited by lack of intraarticular fluid. Joint or bursal effusion Joint effusion: Small bilateral hip joint effusion. Moderate left SI joint effusion. Mild right SI joint effusion. Bursae: Moderate amount of fluid in the greater trochanteric bursa bilaterally. Muscles and tendons Flexors: Normal. Extensors: Normal. Abductors: Normal. Adductors: Normal. Gluteals: Normal. Hamstrings: Partial-thickness tear of the left hamstring origin. Other findings Miscellaneous: No pelvic free fluid. No fluid collection or hematoma. No inguinal lymphadenopathy. No inguinal hernia. Small 10 mm uterine fibroid is noted. IMPRESSION: 1. Moderate left SI joint effusion with subchondral marrow edema and sclerosis in as well as articular surface irregularity and mild adjacent soft tissue edema. Given the asymmetry and additional findings this appearance can be seen with septic arthritis. Asymmetric bilateral sacroiliitis can be seen with osteoarthritis, reactive arthritis, crystalline arthropathy, but given the overall appearance septic arthritis should be excluded. 2. 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 3. Bilateral greater trochanteric bursitis. Electronically Signed   By: HKathreen Devoid  On: 07/16/2019 08:21   Scheduled Meds: . allopurinol  150 mg Oral BID  . amLODipine  10 mg Oral Daily  . hydroxyurea  500 mg Oral BID WC  . metoprolol tartrate  25 mg Oral BID  . mometasone-formoterol  2 puff Inhalation BID  . pantoprazole  40 mg Oral Daily  . polyethylene glycol  17 g Oral Daily  . pravastatin  20 mg Oral Daily   Continuous Infusions:    LOS: 6 days   Time spent: 35 minutes  1. SSinclair GroomsKennerlyMD

## 2019-07-16 NOTE — Care Management Important Message (Signed)
Important Message  Patient Details IM Letter given to Rhea Pink SW to present to the Patient Name: KALISHA PIONTEK MRN: [id number] Date of Birth: Jan 05, 1941   Medicare Important Message Given:  Yes     Kerin Salen 07/16/2019, 11:20 AM

## 2019-07-16 NOTE — Progress Notes (Addendum)
[name]   HEMATOLOGY/ONCOLOGY INPATIENT PROGRESS NOTE  Date of Service: 07/16/2019  Inpatient Attending: .Blain Pais, MD   SUBJECTIVE  Patient notes her back pain is somewhat improved but she is still bedbound at this time.  She thinks that her movement in her leg may be somewhat improved.  She reports that she is not really working with therapy and has not really been up to walk.  She continues to report a small amount of swelling in her ankles.  Reports constipation.  Tolerating hydroxyurea without any difficulty.  Creatinine has now normalized.  CBC from 07/14/2019 continues to show significant leukocytosis..  Uric acid from 07/14/2019 normal.    OBJECTIVE:  NAD  PHYSICAL EXAMINATION: . Vitals:   07/15/19 2026 07/15/19 2123 07/16/19 0514 07/16/19 0724  BP:  126/65 124/65   Pulse:  100 (!) 101   Resp:  20 20   Temp:  98.3 F (36.8 C) 98.3 F (36.8 C)   TempSrc:  Oral Oral   SpO2: 94% 94% 96% 90%  Weight:      Height:       Filed Weights   07/10/19 1639 07/11/19 1119 07/13/19 1058  Weight: 297 lb (134.7 kg) 297 lb 13.5 oz (135.1 kg) 299 lb 13.2 oz (136 kg)   .Body mass index is 48.39 kg/m.  GENERAL:alert, in no acute distress and comfortable LYMPH:  no palpable lymphadenopathy in the cervical, axillary or inguinal LUNGS: clear to auscultation with normal respiratory effort HEART: regular rate & rhythm,  no murmurs and no lower extremity edema ABDOMEN: abdomen soft, non-tender, normoactive bowel sounds  Musculoskeletal: Trace pedal edema PSYCH: alert & oriented x 3 with fluent speech NEURO: no focal motor/sensory deficits  MEDICAL HISTORY:  Past Medical History:  Diagnosis Date   Anemia    with menses   Asthma    Back pain    status post surgery 2002   Breast cyst    Excesion with FNA, begnin in 2004.    Degenerative joint disease of spine    Imaging 2005,  Degenerative hypertrophic facet arthritis changes L4-5 and L5-S1.[name]    History of shingles      Recurrent with post herpetic neuralgia.    Hypertension    Lymphadenopathy    Of the mediastinum, Right side CXR 2008, not read on 2010 cxr.    Menopause    Obesity    BMI 54   Psychosis ([place])    Secondary to prednisone.   Shingles    Stasis dermatitis    W/ LE edema, prviously on lasix now on mazxide.    SVT (supraventricular tachycardia) ([place]) June 2009   one run while hospitalized   Tuberculosis    active TB treated in 2002, hx of paraspinal lumbar TB,     SURGICAL HISTORY: Past Surgical History:  Procedure Laterality Date   BREAST CYST ASPIRATION Left    Breast cyst biopsy     CHOLECYSTECTOMY  02/02/2012   Procedure: LAPAROSCOPIC CHOLECYSTECTOMY;  Surgeon: Zenovia Jarred, MD;  Location: [facility];  Service: General;  Laterality: N/A;   Hemilaminectomy of L4, L5 and S1 decompression of tumor in epidural space  2000    SOCIAL HISTORY: Social History   Socioeconomic History   Marital status: Widowed    Spouse name: Not on file   Number of children: Not on file   Years of education: Not on file   Highest education level: Not on file  Occupational History   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 There are not steps up to the entrance the patient uses. One son stays with her once in a while.      Patient's method of transportation is personal car.      The highest level of education was some high school: 11th grade.      The patient currently retired.       Identified important Relationships are "My friend Mart Piggs, my nieces, my grandkids, a lot of people.       Pets : None       Interests / Fun: "Playing word games on my phone, watch TV, Talking on phone."       Current Stressors: "Nothing, really. I try not to let anything stress me."       Religious / Personal Beliefs: "I believe in God."       L. Ducatte, RN, BSN    Social Determinants of Health   Financial Resource Strain:    Difficulty of Paying Living Expenses: Not on file  Food Insecurity:    Worried About Charity fundraiser in the Last Year: Not on file   [employer] of Food in the Last Year: Not on file  Transportation Needs:    Lack of Transportation (Medical): Not on file   Lack of Transportation (Non-Medical): Not on file  Physical Activity:    Days of Exercise per Week: Not on file   Minutes of Exercise per Session: Not on file  Stress:    Feeling of Stress : Not on file  Social Connections:    Frequency of Communication with Friends and Family: Not on file   Frequency of Social Gatherings with Friends and Family: Not on file   Attends Religious Services: Not on file   Active Member of Clubs or Organizations: Not on file   Attends Archivist Meetings: Not on file   Marital Status: Not on file  Intimate Partner Violence:    Fear of Current or Ex-Partner: Not on file   Emotionally Abused: Not on file   Physically Abused: Not on file   Sexually Abused: Not on file    FAMILY HISTORY: Family History  Problem Relation Age of Onset   Stroke Mother        at young age   Heart disease Mother    Heart attack Father    Heart disease Father    Cancer Sister        Unknown   Stroke Brother    Stroke Brother    Breast cancer Other    Sickle cell anemia Brother    Diabetes Brother    Stroke Brother     ALLERGIES:  is allergic to prednisone.  MEDICATIONS:  Scheduled Meds:  allopurinol  150 mg Oral BID   amLODipine  10  mg Oral Daily   apixaban  5 mg Oral BID   hydroxyurea  500 mg Oral BID WC  metoprolol tartrate  25 mg Oral BID   mometasone-formoterol  2 puff Inhalation BID   pantoprazole  40 mg Oral Daily   pravastatin  20 mg Oral Daily   Continuous Infusions:  PRN Meds:.albuterol, HYDROcodone-acetaminophen  REVIEW OF SYSTEMS:    10 Point review of Systems was done is negative except as noted above.   LABORATORY DATA:  I have reviewed the data as listed  . CBC Latest Ref Rng & Units 07/14/2019 07/13/2019 07/12/2019  WBC 4.0 - 10.5 K/uL 114.2(HH) 109.5(HH) 105.4(HH)  Hemoglobin 12.0 - 15.0 g/dL 9.2(L) 9.1(L) 8.7(L)  Hematocrit 36.0 - 46.0 % 29.2(L) 29.3(L) 28.0(L)  Platelets 150 - 400 K/uL 138(L) 131(L) 139(L)    CMP Latest Ref Rng & Units 07/16/2019 07/15/2019 07/14/2019  Glucose 70 - 99 mg/dL 95 93 102(H)  BUN 8 - 23 mg/dL _0 Creatinine 0.44 - 1.00 mg/dL 0.95 0.94 0.99  Sodium 135 - 145 mmol/L 132(L) 134(L) 133(L)  Potassium 3.5 - 5.1 mmol/L 4.0 4.1 4.0  Chloride 98 - 111 mmol/L 97(L) 99 99  CO2 22 - 32 mmol/L _1 Calcium 8.9 - 10.3 mg/dL 9.0 9.3 9.1  Total Protein 6.5 - 8.1 g/dL 7.5 7.4 7.6  Total Bilirubin 0.3 - 1.2 mg/dL 0.7 0.5 0.8  Alkaline Phos 38 - 126 U/L 238(H) 237(H) 328(H)  AST 15 - 41 U/L 15 14(L) 15  ALT 0 - 44 U/L _2 Component     Latest Ref Rng & Units 07/10/2019 07/12/2019  Iron     28 - 170 ug/dL  46  TIBC     250 - 450 ug/dL  339  Saturation Ratios     10.4 - 31.8 %  14  UIBC     ug/dL  293  Retic Ct Pct     0.4 - 3.1 %  1.7  RBC.     3.87 - 5.11 MIL/uL  2.82 (L)  Retic Count, Absolute     19.0 - 186.0 K/uL  47.1  Immature Retic Fract     2.3 - 15.9 %  22.5 (H)  Uric Acid, Serum     2.5 - 7.1 mg/dL 14.1 (H) 11.3 (H)  Ferritin     11 - 307 ng/mL  152  Vitamin B12     180 - 914 pg/mL  448  Immature Platelet Fraction     1.2 - 8.6 %  17.5 (H)    RADIOGRAPHIC STUDIES: I have personally reviewed the radiological images as  listed and agreed with the findings in the report. DG Chest 2 View  Result Date: 07/12/2019 CLINICAL DATA:  Patient with abnormalities on previous chest x-ray. EXAM: CHEST - 2 VIEW COMPARISON:  Chest x-ray 07/10/2019 FINDINGS: Marked cardiac enlargement similar to prior study accounting for decreased lung volume seen on the current exam. Hilar fullness is accentuated also by low depth of expansion. Basilar opacities are noted bilaterally. Interstitial markings are increased with pulmonary vascular congestion. In the right lung base at the site previously obscured by cardiac lead is a nodular density with rounded morphology. This measures approximately 2.3 cm. IMPRESSION: 1. Stable marked cardiac enlargement with pulmonary vascular congestion. Question mild edema with diminishing lung volumes, consider correlation with signs of heart failure or volume overload. 2. Basilar opacities may represent atelectasis or infection. 3. Area of density with rounded morphology in the right lower lobe along with right hilar fullness. Consider CT of the chest when the patient is  able for further assessment to exclude nodule with hilar adenopathy. Electronically Signed   By: Zetta Bills M.D.   On: 07/12/2019 17:56   Chest Xray 2 View  Result Date: 07/10/2019 CLINICAL DATA:  Wheezing EXAM: CHEST - 2 VIEW COMPARISON:  05/29/2017 FINDINGS: Normal cardiomediastinal silhouette without pneumothorax or pleural effusion. Patchy airspace opacities over the posterior lung bases on lateral view. IMPRESSION: 1. Suspect posterior lower lobe infiltrates on the lateral view. 2. Mild cardiomegaly Electronically Signed   By: Donavan Foil M.D.   On: 07/10/2019 22:53   CT PELVIS WO CONTRAST  Result Date: 07/10/2019 CLINICAL DATA:  Hip pain, chronic possible lytic lesion left sacral ala. EXAM: CT PELVIS WITHOUT CONTRAST TECHNIQUE: Multidetector CT imaging of the pelvis was performed following the standard protocol without intravenous  contrast. COMPARISON:  Plain film study of the same date., bone biopsy images of 06/24/2017. FINDINGS: Urinary Tract:  Choose 1 Bowel: No signs of acute bowel process within visualized portions of the bowel. Vascular/Lymphatic: Not well evaluated given lack of intravenous contrast. Signs of calcific atherosclerotic change throughout the distal abdominal aorta and the iliac vasculature. Reproductive:  Calcified fibroid in the uterine fundus. Other:  No signs of ascites. Musculoskeletal: Signs of bony sclerosis about the left sacroiliac joint, now associated with more bony erosion and indistinct margins than on the previous exam. Widening of the joint since the prior exam. Subtle stranding along the anterior margin of the joint becoming more pronounced inferiorly. The hips are located bilaterally. IMPRESSION: 1. Signs of bony sclerosis about the left sacroiliac joint, now associated with more bony erosion and indistinct margins than on the previous exam. Findings are concerning for septic arthritis versus active sacroiliitis. No signs of fracture. Small cortical 2. Signs of calcific atherosclerotic change throughout the distal abdominal aorta and iliac vasculature. 3. Calcified fibroid in the uterine fundus. 4. Aortic atherosclerosis. Aortic Atherosclerosis (ICD10-I70.0). Electronically Signed   By: Zetta Bills M.D.   On: 07/10/2019 19:02   MR Lumbar Spine W Wo Contrast  Result Date: 07/15/2019 CLINICAL DATA:  Low back pain with left leg weakness. Possible myeloproliferative disorder. EXAM: MRI LUMBAR SPINE WITHOUT AND WITH CONTRAST TECHNIQUE: Multiplanar and multiecho pulse sequences of the lumbar spine were obtained without and with intravenous contrast. CONTRAST:  82m GADAVIST GADOBUTROL 1 MMOL/ML IV SOLN COMPARISON:  None. FINDINGS: Segmentation:  Standard. Alignment:  Physiologic. Vertebrae:  No fracture, evidence of discitis, or bone lesion. Conus medullaris and cauda equina: Conus extends to the L2  level. Conus and cauda equina appear normal. Paraspinal and other soft tissues: Negative Disc levels: Visualized lower thoracic disc spaces are normal. T12-L1: Normal disc space and facets. No spinal canal or neuroforaminal stenosis. L1-L2: Normal disc space and facets. No spinal canal or neuroforaminal stenosis. L2-L3: Normal disc space and facets. No spinal canal or neuroforaminal stenosis. L3-L4: Small disc bulge with mild spinal canal narrowing. No neural foraminal stenosis. L4-L5: Remote left laminectomy. Mild disc bulge. Mild bilateral foraminal narrowing, unchanged. No spinal canal stenosis. L5-S1: Remote left laminectomy. No spinal canal or neural foraminal stenosis. Unchanged. Visualized sacrum: Normal. IMPRESSION: 1. Unchanged examination with mild bilateral L4-L5 neural foraminal stenosis. 2. No spinal canal or neural foraminal stenosis. 3. Remote left laminectomy at L4-L5 and L5-S1. Electronically Signed   By: KUlyses JarredM.D.   On: 07/15/2019 22:26   MR HIP LEFT W WO CONTRAST  Result Date: 07/16/2019 CLINICAL DATA:  Left hip pain.  Pain and weakness. EXAM: MRI OF THE LEFT HIP  WITHOUT AND WITH CONTRAST TECHNIQUE: Multiplanar, multisequence MR imaging was performed both before and after administration of intravenous contrast. CONTRAST:  56m GADAVIST GADOBUTROL 1 MMOL/ML IV SOLN COMPARISON:  None. FINDINGS: Bones: No hip fracture, dislocation or avascular necrosis. No periosteal reaction or bone destruction. No aggressive osseous lesion. Joint space narrowing and tiny marginal osteophytes of the right SI joint consistent with mild osteoarthritis. Small right SI joint effusion. Moderate left SI joint effusion with subchondral marrow edema and sclerosis in as well as articular surface irregularity and mild adjacent soft tissue edema. Post biopsy changes in the Degenerative disc disease with disc height loss at L4-5 and L5-S1. Articular cartilage and labrum Articular cartilage:  No chondral defect.  Labrum: Grossly intact, but evaluation is limited by lack of intraarticular fluid. Joint or bursal effusion Joint effusion: Small bilateral hip joint effusion. Moderate left SI joint effusion. Mild right SI joint effusion. Bursae: Moderate amount of fluid in the greater trochanteric bursa bilaterally. Muscles and tendons Flexors: Normal. Extensors: Normal. Abductors: Normal. Adductors: Normal. Gluteals: Normal. Hamstrings: Partial-thickness tear of the left hamstring origin. Other findings Miscellaneous: No pelvic free fluid. No fluid collection or hematoma. No inguinal lymphadenopathy. No inguinal hernia. Small 10 mm uterine fibroid is noted. IMPRESSION: 1. Moderate left SI joint effusion with subchondral marrow edema and sclerosis in as well as articular surface irregularity and mild adjacent soft tissue edema. Given the asymmetry and additional findings this appearance can be seen with septic arthritis. Asymmetric bilateral sacroiliitis can be seen with osteoarthritis, reactive arthritis, crystalline arthropathy, but given the overall appearance septic arthritis should be excluded. 2. 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 3. Bilateral greater trochanteric bursitis. Electronically Signed   By: HKathreen Devoid  On: 07/16/2019 08:21   CT BIOPSY  Result Date: 07/13/2019 INDICATION: History of MPN and CHEK 2 deletion now with progression of new anemia, thrombocytopenia and leukocytosis. Please perform CT-guided bone marrow biopsy for tissue diagnostic purposes. Note, questioned left-sided sacroiliitis with identified on recent pelvic CT and as such the decision was made to proceed with bone marrow biopsy of the right iliac crest. EXAM: CT-GUIDED BONE MARROW BIOPSY AND ASPIRATION MEDICATIONS: None ANESTHESIA/SEDATION: Fentanyl 150 mcg IV; Versed 3 mg IV Sedation Time: 10 Minutes; The patient was continuously monitored during the procedure by the interventional radiology nurse under my direct supervision.  COMPLICATIONS: None immediate. PROCEDURE: Informed consent was obtained from the patient following an explanation of the procedure, risks, benefits and alternatives. The patient understands, agrees and consents for the procedure. All questions were addressed. A time out was performed prior to the initiation of the procedure. The patient was positioned prone and non-contrast localization CT was performed of the pelvis to demonstrate the iliac marrow spaces. The operative site was prepped and draped in the usual sterile fashion. Under sterile conditions and local anesthesia, a 22 gauge spinal needle was utilized for procedural planning. Next, an 11 gauge coaxial bone biopsy needle was advanced into the right iliac marrow space. Needle position was confirmed with CT imaging. Initially, bone marrow aspiration was performed. Next, a bone marrow biopsy was obtained with the 11 gauge outer bone marrow device. Samples were prepared with the cytotechnologist and deemed adequate. The needle was removed intact. Hemostasis was obtained with compression and a dressing was placed. The patient tolerated the procedure well without immediate post procedural complication. IMPRESSION: Successful CT guided right iliac bone marrow aspiration and core biopsy. Electronically Signed   By: JSandi MariscalM.D.   On:  07/13/2019 11:06   DEXAScan  Result Date: 06/30/2019 EXAM: DUAL X-RAY ABSORPTIOMETRY (DXA) FOR BONE MINERAL DENSITY IMPRESSION: Referring Physician:  [name] Your patient completed a BMD test using Lunar IDXA DXA system ( analysis version: 16 ) manufactured by [employer]. Technologist: AW PATIENT: Name: Kaylina, Cahue Patient ID: [id number] Birth Date: 1941/05/07 Height:     65.0 in. Sex: Female Measured: 06/30/2019 Weight:     298.0 lbs. Indications: Advanced Age, Estrogen Deficient, Postmenopausal Fractures: None Treatments: None ASSESSMENT: The BMD measured at Femur Total Left is 0.848 g/cm2 with a T-score of  -1.3. This patient is considered OSTEOPENIC according to [facility]) criteria. The scan quality is good. Site Region Measured Date Measured Age YA T-score BMD Significant CHANGE AP Spine  L1-L4      8/30[age]         -1.0    1.062 g/cm2 DualFemur Total Left 8/30/2019    76.4         -1.3    0.848 g/cm2 DualFemur Total Mean 8/30/2019    76.4         -1.2    0.852 g/cm2 World Health Organization [facility]) criteria for post-menopausal, Caucasian Women: Normal       T-score at or above -1 SD Osteopenia   T-score between -1 and -2.5 SD Osteoporosis T-score at or below -2.5 SD RECOMMENDATION: 1. All patients should optimize calcium and vitamin D intake. 2. Consider FDA approved medical therapies in postmenopausal women and men aged 54 years and older, based on the following: a. A hip or vertebral (clinical or morphometric) fracture b. T- score < or = -2.5 at the femoral neck or spine after appropriate evaluation to exclude secondary causes c. Low bone mass (T-score between -1.0 and -2.5 at the femoral neck or spine) and a 10 year probability of a hip fracture > or = 3% or a 10 year probability of a major osteoporosis-related fracture > or = 20% based on the [place]-adapted WHO algorithm d. Clinician judgment and/or patient preferences may indicate treatment for people with 10-year fracture probabilities above or below these levels FOLLOW-UP: Patients with diagnosis of osteoporosis or at high risk for fracture should have regular bone mineral density tests. For patients eligible for Medicare, routine testing is allowed once every 2 years. The testing frequency can be increased to one year for patients who have rapidly progressing disease, those who are receiving or discontinuing medical therapy to restore bone mass, or have additional risk factors. I have reviewed this report and agree with the above findings. Mark A. Thornton Papas, M.D. [facility] FRAX* 10-year Probability of Fracture Based on  femoral neck BMD: DualFemur (Right) Major Osteoporotic Fracture: 3.9% Hip Fracture:                0.6% Population:                  [place] (Black) Risk Factors:                None *FRAX is a Materials engineer of the [employer] for Metabolic Bone Disease, a World Pharmacologist (WHO) [facility]. ASSESSMENT: The probability of a major osteoporotic fracture is 3.9 % within the next ten years. The probability of a hip fracture is 0.6 % within the next ten years. I have reviewed this report and agree with the above findings. Mark A. Thornton Papas, M.D. [facility] Electronically Signed   By: Elta Guadeloupe  Thornton Papas M.D.   On: 06/30/2019 09:33   DG CHEST PORT 1 VIEW  Result Date: 07/14/2019 CLINICAL DATA:  Pulmonary nodule. EXAM: PORTABLE CHEST 1 VIEW COMPARISON:  Radiograph 07/12/2019. CT 05/31/2017 FINDINGS: Unchanged cardiomegaly. Unchanged mediastinal contours. The rounded density at the right lung base on prior radiograph is not seen currently. Pulmonary vascular congestion is similar to prior. Streaky bibasilar atelectasis. No new airspace disease or pneumothorax. No large pleural effusion. IMPRESSION: 1. Unchanged cardiomegaly and pulmonary vascular congestion. 2. The rounded density at the right lung base on prior radiograph is not seen currently. 3. Streaky bibasilar atelectasis. No new abnormality. Electronically Signed   By: Keith Rake M.D.   On: 07/14/2019 05:41   Mammogram Digital Screening  Result Date: 07/01/2019 CLINICAL DATA:  Screening. EXAM: DIGITAL SCREENING BILATERAL MAMMOGRAM WITH CAD COMPARISON:  Previous exam(s). ACR Breast Density Category b: There are scattered areas of fibroglandular density. FINDINGS: There are no findings suspicious for malignancy. Images were processed with CAD. IMPRESSION: No mammographic evidence of malignancy. A result letter of this screening mammogram will be mailed directly to the patient. RECOMMENDATION: Screening  mammogram in one year. (Code:SM-B-01Y) BI-RADS CATEGORY  1: Negative. Electronically Signed   By: Lajean Manes M.D.   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 Please perform CT-guided bone marrow biopsy for tissue diagnostic purposes. Note, questioned left-sided sacroiliitis with identified on recent pelvic CT and as such the decision was made to proceed with bone marrow biopsy of the right iliac crest. EXAM: CT-GUIDED BONE MARROW BIOPSY AND ASPIRATION MEDICATIONS: None ANESTHESIA/SEDATION: Fentanyl 150 mcg IV; Versed 3 mg IV Sedation Time: 10 Minutes; The patient was continuously monitored during the procedure by the interventional radiology nurse under my direct supervision. COMPLICATIONS: None immediate. PROCEDURE: Informed consent was obtained from the patient following an explanation of the procedure, risks, benefits and alternatives. The patient understands, agrees and consents for the procedure. All questions were addressed. A time out was performed prior to the initiation of the procedure. The patient was positioned prone and non-contrast localization CT was performed of the pelvis to demonstrate the iliac marrow spaces. The operative site was prepped and draped in the usual sterile fashion. Under sterile conditions and local anesthesia, a 22 gauge spinal needle was utilized for procedural planning. Next, an 11 gauge coaxial bone biopsy needle was advanced into the right iliac marrow space. Needle position was confirmed with CT imaging. Initially, bone marrow aspiration was performed. Next, a bone marrow biopsy was obtained with the 11 gauge outer bone marrow device. Samples were prepared with the cytotechnologist and deemed adequate. The needle was removed intact. Hemostasis was obtained with compression and a dressing was placed. The patient  tolerated the procedure well without immediate post procedural complication. IMPRESSION: Successful CT guided right iliac bone marrow aspiration and core biopsy. Electronically Signed   By: Sandi Mariscal M.D.   On: 07/13/2019 11:06   DG Hip Unilat W or Wo Pelvis 2-3 Views Left  Result Date: 07/10/2019 CLINICAL DATA:  Left-sided hip pain EXAM: DG HIP (WITH OR WITHOUT PELVIS) 2-3V LEFT COMPARISON:  CT 05/29/2017 FINDINGS: No fracture or malalignment. Mild joint space narrowing. Pubic symphysis and rami appear intact. Questionable lytic change involving the left sacral ala. IMPRESSION: 1. No acute osseous abnormality of left hip. Mild degenerative change 2. Questionable lytic process involving the left sacral  ala. CT could be obtained for further evaluation. Electronically Signed   By: Donavan Foil M.D.   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  Foundation One heme with Chek 2 mutation. No other findings noted. JAK2 and BCR-ABL mutations neg.  06/24/17 BM bx- this showed hypercellular bone marrow with granulocytic proliferation. Cytogenetic analysis was normal.   07/13/2019 bone marrow biopsy-hypercellular bone marrow with features of myeloproliferative neoplasm.  Features generally similar to previous bone marrow biopsy and consistent with previously known myeloproliferative neoplasm.  There is a minor plasma cell component (2%) displaying lambda light chain excess and an early plasma cell neoplastic process is not excluded.  There is no evidence of a lymphoproliferative process.  Cytogenetics pending.  #2 mild normocytic anemia.  Hemoglobin not checked today.  Was 9.2 on 07/14/2019.  #3  Mild thrombocytopenia at 129k---> 139k---> 131k--> 138k on 07/14/2019  #4 left Sacroiliac joint  inflammation versus septic arthritis. #5 possible pneumonia  PLAN: -Continue patient on low dose hydroxyurea 563m po twice daily with meals.  No symptomatology suggestive of leukostasis.  In the setting of possible infection will hold off on ramping up hydroxyurea dose significantly. -continue Allopurinol --most recent uric acid level had normalized. -Recommend Daily CBC CMP and uric acid -Repeat bone marrow biopsy overall consistent with prior bone marrow biopsy.  We will follow-up on cytogenetics. -If possible would recommend CT-guided aspiration of left sacroiliac joint to rule out infection/inflammation to determine the etiology of her left sacroiliitis.  If infection is ruled out could consider left SI joint steroid injection for pain control. -Transfuse as needed for hemoglobin less than 8 or if symptomatic.  Given elevated white counts will try to avoid excessive transfusions due to low but possible risk of hyperviscosity. -For her constipation, I have started her on MiraLAX 17 g daily. -Recommend PT/OT evaluation.  KMikey Bussing DNP, AGPCNP-BC, AOCNP  07/16/2019 10:26 AM    ADDENDUM  .Patient was Personally and independently interviewed, examined and relevant elements of the history of present illness were reviewed in details and an assessment and plan was created. All elements of the patient's history of present illness , assessment and plan were discussed in details with KMikey Bussing DNP. The above documentation reflects our combined findings assessment and plan.  Discussed patient's bone marrow results in detail with our hematopathologist Dr. SGari Crown  Continues to appear like a myeloproliferative neoplasm.  No evidence of transformation to acute myeloid leukemia. Was noted to have a minor plasma cell population displaying light chain excess about 2%.  Cannot rule out an early clonal plasma cell disorder that potentially could be evolving. We will check myeloma panel and  kappa lambda free light chains tomorrow a.m..[name] MRI of the hip noted.  Would be a good idea to consider CT-guided aspiration of left SI joint to rule out infection or crystal induced arthropathy.  If these are ruled out out she might benefit from possible SI joint steroid injection for symptom control. PT and OT evaluation to determine discharge planning.   GSullivan LoneMD MS

## 2019-07-17 ENCOUNTER — Inpatient Hospital Stay (HOSPITAL_COMMUNITY): Payer: PPO

## 2019-07-17 HISTORY — PX: IR FLUORO GUIDED NEEDLE PLC ASPIRATION/INJECTION LOC: IMG2395

## 2019-07-17 LAB — COMPREHENSIVE METABOLIC PANEL
ALT: 10 U/L (ref 0–44)
AST: 16 U/L (ref 15–41)
Albumin: 2.9 g/dL — ABNORMAL LOW (ref 3.5–5.0)
Alkaline Phosphatase: 237 U/L — ABNORMAL HIGH (ref 38–126)
Anion gap: 9 (ref 5–15)
BUN: 22 mg/dL (ref 8–23)
CO2: 27 mmol/L (ref 22–32)
Calcium: 9 mg/dL (ref 8.9–10.3)
Chloride: 96 mmol/L — ABNORMAL LOW (ref 98–111)
Creatinine, Ser: 0.86 mg/dL (ref 0.44–1.00)
GFR calc Af Amer: >60 mL/min (ref >60)
GFR calc non Af Amer: >60 mL/min (ref >60)
Glucose, Bld: 98 mg/dL (ref 70–99)
Potassium: 4.1 mmol/L (ref 3.5–5.1)
Sodium: 132 mmol/L — ABNORMAL LOW (ref 135–145)
Total Bilirubin: 0.7 mg/dL (ref 0.3–1.2)
Total Protein: 7.4 g/dL (ref 6.5–8.1)

## 2019-07-17 LAB — CBC WITH DIFFERENTIAL/PLATELET
Abs Immature Granulocytes: 6.53 10*3/uL — ABNORMAL HIGH (ref 0.00–0.07)
Basophils Absolute: 0.1 10*3/uL (ref 0.0–0.1)
Basophils Relative: 0 %
Eosinophils Absolute: 0.4 10*3/uL (ref 0.0–0.5)
Eosinophils Relative: 0 %
HCT: 26.8 % — ABNORMAL LOW (ref 36.0–46.0)
Hemoglobin: 8.7 g/dL — ABNORMAL LOW (ref 12.0–15.0)
Immature Granulocytes: 6 %
Lymphocytes Relative: 3 %
Lymphs Abs: 2.9 10*3/uL (ref 0.7–4.0)
MCH: 31.5 pg (ref 26.0–34.0)
MCHC: 32.5 g/dL (ref 30.0–36.0)
MCV: 97.1 fL (ref 80.0–100.0)
Monocytes Absolute: 2.5 10*3/uL — ABNORMAL HIGH (ref 0.1–1.0)
Monocytes Relative: 2 %
Neutro Abs: 89.3 10*3/uL — ABNORMAL HIGH (ref 1.7–7.7)
Neutrophils Relative %: 89 %
Platelets: 168 10*3/uL (ref 150–400)
RBC: 2.76 MIL/uL — ABNORMAL LOW (ref 3.87–5.11)
RDW: 15.1 % (ref 11.5–15.5)
WBC: 101.7 10*3/uL (ref 4.0–10.5)
nRBC: 0 % (ref 0.0–0.2)

## 2019-07-17 LAB — C-REACTIVE PROTEIN: CRP: 8.2 mg/dL — ABNORMAL HIGH (ref <1.0)

## 2019-07-17 LAB — CK: Total CK: 74 U/L (ref 38–234)

## 2019-07-17 LAB — URIC ACID: Uric Acid, Serum: 3.4 mg/dL (ref 2.5–7.1)

## 2019-07-17 LAB — SEDIMENTATION RATE: Sed Rate: 98 mm/hr — ABNORMAL HIGH (ref 0–22)

## 2019-07-17 MED ORDER — SODIUM CHLORIDE (PF) 0.9 % IJ SOLN
INTRAMUSCULAR | Status: AC
  Filled 2019-07-17: qty 10

## 2019-07-17 MED ORDER — APIXABAN 5 MG PO TABS
5.0000 mg | ORAL_TABLET | Freq: Two times a day (BID) | ORAL | Status: DC
  Administered 2019-07-17 – 2019-07-28 (×20): 5 mg via ORAL
  Filled 2019-07-17 (×9): qty 1
  Filled 2019-07-17: qty 2
  Filled 2019-07-17 (×11): qty 1

## 2019-07-17 MED ORDER — LIDOCAINE HCL (PF) 1 % IJ SOLN
INTRAMUSCULAR | Status: AC
  Filled 2019-07-17: qty 30

## 2019-07-17 NOTE — Progress Notes (Signed)
PROGRESS NOTE    SHARMAYNE JABLON  RZN:[id number] DOB: [birth year] DOA: 07/10/2019 PCP: [name], MD  Outpatient Specialists:   Brief Narrative:   Patient is a 78 year old African-American female, morbidly obese with BMI of 48 kg/m, and past medical history significant for myeloproliferative neoplasm NOS-didnt see oncology for 7-58month, hypertension, asthma, paroxysmal atrial fibrillation and status post resection of lumbar spine mass.  Patient also has history of chronic lower back pain, with possible radiculopathy.  Patient presented with worsening right-sided lower back pain and the inability to ambulate over 2 days. -In the emergency room she was noted to have a white count of 107 compared to previous level of 61, creatinine of 1.4, uric acid of 14, ESR of 70, CT pelvis nonspecific, question septic arthritis versus sacroiliitis, seen by orthopedics did not feel this was consistent with septic arthritis -Seen by oncology for profound leukocytosis, concern for tumor lysis syndrome -Underwent bone marrow aspiration biopsy 12/7  Assessment & Plan:   Severe back pain/inability to ambulate/possible left-sided sacroiliitis versus septic left sacral iliac joint: -Patient has acute on chronic left hip and lower back pain along with radiculopathy -She was treated for paraspinal/lumbar TB in 2002, and also underwent hemilaminectomy of L4-L5 and S1, decompression of tumor and epidural space, which 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  -Seen by Orthopedics in consultation, infection felt to be less likely -However, given her worsening symptoms, limitations in ambulation and complex history of TB in her LS spine agree with MRI spine and left hip with and without contrast. [facility]  -MRI of spine negative for evidence of infectious process but left SI joint could not have septic arthritis ruled out.  -Patient underwent SI joint aspiration on 12/11, fluid Gram stain with no organism. Culture is pending. Will continue monitoring off Abx.  -She reports feeling better and being able to move her left leg after starting tizanidine which will be continued.   Continue:  -PT OT -Pain control -If infectious process ruled out, may have a trial of medrol (she reports feeling anxious and hyper with prednisone that she took for asthma in the past but is not sure if she has had medrol dose pack).  -Will continue tizanidine as needed as muscle relaxer.  -Continue heating pad, tylenol as needed.   Severe leukocytosis, myeloproliferative Neoplasm NOS: -WBC 107 on presentation,  -Oncology Dr. KIrene Limbo following -She is s/p bone marrow biopsy, results per Hem/Onc, with no transformation/leukemia. Hydroxyurea held.   Elevated uric acid level/tumor lysis syndrome -Uric acid was 14.1 on presentation. Down to 3.4 now.  -Calcium, potassium within normal range, kidney function improving  -Continue daily allopurinol  Paroxysmal atrial fibrillation: Heart rate is controlled Continue metoprolol 12.5 mg p.o. twice daily.  Resuming apixaban 5 mg p.o. twice daily after SI aspiration.  Continue telemetry    Morbid obesity: -Needs aggressive lifestyle modification  Possible lower lobe infiltrates as per chest x-ray on presentation: -Repeat CXR with no infiltrate and patient with no symptoms to indicate pneumonia.  -Continue monitoring off antibiotics.   Acute Kidney injury: -Improved with hydration, creatinine down to 0.9  DVT prophylaxis: Apixaban Code Status: Full code Family Communication: none at bedside Disposition Plan: to be determined, may need rehab   Consultants:  Hematology/oncology  Orthopedics   I.R.   Procedures:  Bone Marrow biopsy scheduled  for 07/13/19 Antimicrobials:   IV vancomycin 12/5-12/6  IV ceftriaxone  07/11/2019-07/12/2019.  IV cefepime discontinued on 12/6   Subjective: Patient seen and examined this morning. Reported being able to move her left leg better, after receiving tizanidine.   Objective: Vitals:   07/17/19 0104 07/17/19 0506 07/17/19 0822 07/17/19 1326  BP: 131/74 133/69  126/64  Pulse: 100 100  95  Resp: 18 16  18   Temp: 98.4 F (36.9 C) 98.7 F (37.1 C)  98.1 F (36.7 C)  TempSrc:    Oral  SpO2: 95% 97% 95% 93%  Weight:      Height:        Intake/Output Summary (Last 24 hours) at 07/17/2019 1704 Last data filed at 07/17/2019 0800 Gross per 24 hour  Intake 720 ml  Output 400 ml  Net 320 ml   Filed Weights   07/10/19 1639 07/11/19 1119 07/13/19 1058  Weight: 134.7 kg 135.1 kg 136 kg    Examination:  Gen: Morbidly obese pleasant female sitting up in bed awake, Alert, Oriented X 3, no distress.  HEENT: Neck supple, no JVD.  Lungs: No respiratory distress. No wheezing.  CVS: RRR Abd: soft, Non tender, non distended Extremities: No edema, slight improvement of bilateral leg abduction, adduction and flexion but still impaired due to pain in her hips, worse in the left. Distal pulses intact, no cyanosis, appropriately warm to touch. No tenderness to palpation.   Skin: no new rashes  Data Reviewed: I have personally reviewed following labs and imaging studies  CBC: Recent Labs  Lab 07/10/19 1735 07/11/19 0500 07/12/19 0129 07/13/19 0358 07/14/19 0542 07/17/19 0758  WBC 107.4* 92.2* 105.4* 109.5* 114.2* 101.7*  NEUTROABS 93.5*  --  90.5* 93.2* 106.2* 89.3*  HGB 10.0* 9.1* 8.7* 9.1* 9.2* 8.7*  HCT 30.9* 29.7* 28.0*  27.7* 29.3* 29.2* 26.8*  MCV 98.7 99.3 99.3 100.0 99.0 97.1  PLT 145* 129* 139* 131* 138* 092   Basic Metabolic Panel: Recent Labs  Lab 07/11/19 1455 07/12/19 0129 07/13/19 0358 07/14/19 0542 07/15/19 0611 07/16/19 0548 07/17/19 0514  NA  --  135 138   137 133* 134* 132* 132*  K  --  4.2 4.1  4.0 4.0 4.1 4.0 4.1  CL  --  102 102  103 99 99 97* 96*  CO2  --  21* 23  23 26 25 25 27   GLUCOSE  --  76 62*  63* 102* 93 95 98  BUN  --  23 12  12 14 17 20 22   CREATININE  --  1.31* 0.90  0.90 0.99 0.94 0.95 0.86  CALCIUM  --  8.8* 9.4  9.3 9.1 9.3 9.0 9.0  MG 1.8 2.0 1.8  --   --   --   --   PHOS 4.2  --  3.4 4.0  --   --   --    GFR: Estimated Creatinine Clearance: 79.1 mL/min (by C-G formula based on SCr of 0.86 mg/dL). Liver Function Tests: Recent Labs  Lab 07/13/19 0358 07/14/19 0542 07/15/19 0611 07/16/19 0548 07/17/19 0514  AST 16 15 14* 15 16  ALT 11 12 11 11 10   ALKPHOS 320* 328* 237* 238* 237*  BILITOT 0.4 0.8 0.5 0.7 0.7  PROT 7.3 7.6 7.4 7.5 7.4  ALBUMIN 2.9*  2.9* 3.1* 2.7* 2.8* 2.9*   No results for input(s): LIPASE, AMYLASE in the last 168  hours. No results for input(s): AMMONIA in the last 168 hours. Coagulation Profile: Recent Labs  Lab 07/10/19 1735 07/13/19 0358  INR 1.3* 1.2   Cardiac Enzymes: Recent Labs  Lab 07/11/19 1455 07/17/19 0514  CKTOTAL 161 74   BNP (last 3 results) No results for input(s): PROBNP in the last 8760 hours. HbA1C: No results for input(s): HGBA1C in the last 72 hours. CBG: No results for input(s): GLUCAP in the last 168 hours. Lipid Profile: No results for input(s): CHOL, HDL, LDLCALC, TRIG, CHOLHDL, LDLDIRECT in the last 72 hours. Thyroid Function Tests: No results for input(s): TSH, T4TOTAL, FREET4, T3FREE, THYROIDAB in the last 72 hours. Anemia Panel: No results for input(s): VITAMINB12, FOLATE, FERRITIN, TIBC, IRON, RETICCTPCT in the last 72 hours. Urine analysis:    Component Value Date/Time   COLORURINE YELLOW 07/11/2019 1443   APPEARANCEUR CLOUDY (A) 07/11/2019 1443   LABSPEC 1.021 07/11/2019 1443   PHURINE 5.0 07/11/2019 1443   GLUCOSEU NEGATIVE 07/11/2019 1443   HGBUR MODERATE (A) 07/11/2019 1443   BILIRUBINUR NEGATIVE 07/11/2019 1443   KETONESUR 5  (A) 07/11/2019 1443   PROTEINUR NEGATIVE 07/11/2019 1443   UROBILINOGEN 1.0 09/09/2012 0051   NITRITE NEGATIVE 07/11/2019 1443   LEUKOCYTESUR TRACE (A) 07/11/2019 1443    Recent Results (from the past 240 hour(s))  SARS CORONAVIRUS 2 (TAT 6-24 HRS) Nasopharyngeal Nasopharyngeal Swab     Status: None   Collection Time: 07/10/19  9:21 PM   Specimen: Nasopharyngeal Swab  Result Value Ref Range Status   SARS Coronavirus 2 NEGATIVE NEGATIVE Final    Comment: (NOTE) SARS-CoV-2 target nucleic acids are NOT DETECTED. The SARS-CoV-2 RNA is generally detectable in upper and lower respiratory specimens during the acute phase of infection. Negative results do not preclude SARS-CoV-2 infection, do not rule out co-infections with other pathogens, and should not be used as the sole basis for treatment or other patient management decisions. Negative results must be combined with clinical observations, patient history, and epidemiological information. The expected result is Negative. Fact Sheet for Patients: SugarRoll.be Fact Sheet for Healthcare Providers: https://www.woods-mathews.com/ This test is not yet approved or cleared by the [place] FDA and  has been authorized for detection and/or diagnosis of SARS-CoV-2 by FDA under an Emergency Use Authorization (EUA). This EUA will remain  in effect (meaning this test can be used) for the duration of the COVID-19 declaration under Section 56 4(b)(1) of the Act, 21 U.S.C. section 360bbb-3(b)(1), unless the authorization is terminated or revoked sooner. Performed at [facility], [place] [address] [place], [place] [address]   Aerobic/Anaerobic Culture (surgical/deep wound)     Status: None (Preliminary result)   Collection Time: 07/17/19 10:35 AM   Specimen: Wound; Synovial Fluid  Result Value Ref Range Status   Specimen Description   Final    WOUND LT SACROILIAC ASPIRATION Performed at [place] [address] [place], [place] [address]    Special Requests   Final    Normal Performed at [facility], [place] [address] [place], [place] [address]    Gram Stain   Final    RARE WBC PRESENT, PREDOMINANTLY PMN NO ORGANISMS SEEN Performed at [facility], [place] [address], [place], [place] [address]    Culture PENDING  Incomplete   Report Status PENDING  Incomplete     Radiology Studies: MR Lumbar Spine W Wo Contrast  Result Date: 07/15/2019 CLINICAL DATA:  Low back pain with left leg weakness.  Possible myeloproliferative disorder. EXAM: MRI LUMBAR SPINE WITHOUT AND WITH CONTRAST TECHNIQUE: Multiplanar and multiecho pulse sequences of the lumbar spine were obtained without and with intravenous contrast. CONTRAST:  61m GADAVIST GADOBUTROL 1 MMOL/ML IV SOLN COMPARISON:  None. FINDINGS: Segmentation:  Standard. Alignment:  Physiologic. Vertebrae:  No fracture, evidence of discitis, or bone lesion. Conus medullaris and cauda equina: Conus extends to the L2 level. Conus and cauda equina appear normal. Paraspinal and other soft tissues: Negative Disc levels: Visualized lower thoracic disc spaces are normal. T12-L1: Normal disc space and facets. No spinal canal or neuroforaminal stenosis. L1-L2: Normal disc space and facets. No spinal canal or neuroforaminal stenosis. L2-L3: Normal disc space and facets. No spinal canal or neuroforaminal stenosis. L3-L4: Small disc bulge with mild spinal canal narrowing. No neural foraminal stenosis. L4-L5: Remote left laminectomy. Mild disc bulge. Mild bilateral foraminal narrowing, unchanged. No spinal canal stenosis. L5-S1: Remote left laminectomy. No spinal canal or neural foraminal stenosis. Unchanged. Visualized sacrum: Normal. IMPRESSION: 1. Unchanged examination with mild bilateral L4-L5 neural foraminal stenosis. 2. No spinal canal or neural foraminal stenosis. 3. Remote left laminectomy at L4-L5 and L5-S1.  Electronically Signed   By: KUlyses JarredM.D.   On: 07/15/2019 22:26   MR HIP LEFT W WO CONTRAST  Result Date: 07/16/2019 CLINICAL DATA:  Left hip pain.  Pain and weakness. EXAM: MRI OF THE LEFT HIP WITHOUT AND WITH CONTRAST TECHNIQUE: Multiplanar, multisequence MR imaging was performed both before and after administration of intravenous contrast. CONTRAST:  112mGADAVIST GADOBUTROL 1 MMOL/ML IV SOLN COMPARISON:  None. FINDINGS: Bones: No hip fracture, dislocation or avascular necrosis. No periosteal reaction or bone destruction. No aggressive osseous lesion. Joint space narrowing and tiny marginal osteophytes of the right SI joint consistent with mild osteoarthritis. Small right SI joint effusion. Moderate left SI joint effusion with subchondral marrow edema and sclerosis in as well as articular surface irregularity and mild adjacent soft tissue edema. Post biopsy changes in the Degenerative disc disease with disc height loss at L4-5 and L5-S1. Articular cartilage and labrum Articular cartilage:  No chondral defect. Labrum: Grossly intact, but evaluation is limited by lack of intraarticular fluid. Joint or bursal effusion Joint effusion: Small bilateral hip joint effusion. Moderate left SI joint effusion. Mild right SI joint effusion. Bursae: Moderate amount of fluid in the greater trochanteric bursa bilaterally. Muscles and tendons Flexors: Normal. Extensors: Normal. Abductors: Normal. Adductors: Normal. Gluteals: Normal. Hamstrings: Partial-thickness tear of the left hamstring origin. Other findings Miscellaneous: No pelvic free fluid. No fluid collection or hematoma. No inguinal lymphadenopathy. No inguinal hernia. Small 10 mm uterine fibroid is noted. IMPRESSION: 1. Moderate left SI joint effusion with subchondral marrow edema and sclerosis in as well as articular surface irregularity and mild adjacent soft tissue edema. Given the asymmetry and additional findings this appearance can be seen with septic  arthritis. Asymmetric bilateral sacroiliitis can be seen with osteoarthritis, reactive arthritis, crystalline arthropathy, but given the overall appearance septic arthritis should be excluded. 2. 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 3. Bilateral greater trochanteric bursitis. Electronically Signed   By: HeKathreen Devoid On: 07/16/2019 08:21   IR Fluoro Guide Ndl Plmt / BX  Result Date: 07/17/2019 CLINICAL DATA:  Left hip pain. MR suggests left sacroiliitis, possibly septic arthritis PROCEDURE: LEFT SACROILIAC JOINT ASPIRATION UNDER FLUOROSCOPY TECHNIQUE: The procedure, risks (including but not limited to bleeding, infection, organ damage ), benefits, and alternatives were explained to the patient. Questions regarding the procedure were encouraged  and answered. The patient understands and consents to the procedure. patient placed prone on the fluoroscopy table. Operator donned sterile gloves and mask. A posterior approach was taken using a curved 20 gauge spinal needle. A scant amount of bloody fluid less than 1 mL could be aspirated from the joint despite vigorous aspiration and changes in needle position. The sample was sent for Gram stain and culture. The procedure was well-tolerated. FLUOROSCOPY TIME:  0.6 minute; 612 uGym2 DAP IMPRESSION: Technically successful left sacro-iliac joint aspiration under fluoroscopy Electronically Signed   By: Lucrezia Europe M.D.   On: 07/17/2019 13:46   Scheduled Meds: . allopurinol  150 mg Oral BID  . amLODipine  10 mg Oral Daily  . apixaban  5 mg Oral BID  . lidocaine (PF)      . metoprolol tartrate  25 mg Oral BID  . mometasone-formoterol  2 puff Inhalation BID  . pantoprazole  40 mg Oral Daily  . polyethylene glycol  17 g Oral Daily  . pravastatin  20 mg Oral Daily   Continuous Infusions:    LOS: 7 days   Time spent: 25 minutes  1. Sinclair Grooms KennerlyMD

## 2019-07-17 NOTE — Procedures (Signed)
  Procedure: FLuoro aspiration Left SI joint 20g -- scant bloody fluid <17ml, for GS, C&S EBL:   minimal Complications:  none immediate  See full dictation in [facility].  Dillard Cannon MD Main # [phone] Pager  [phone]

## 2019-07-17 NOTE — Progress Notes (Signed)
Hematology Short note  . CBC    Component Value Date/Time   WBC 101.7 (HH) 07/17/2019 0758   RBC 2.76 (L) 07/17/2019 0758   HGB 8.7 (L) 07/17/2019 0758   HGB 11.4 (L) 03/06/2018 0910   HGB 11.3 (L) 07/24/2017 1402   HCT 26.8 (L) 07/17/2019 0758   HCT 27.7 (L) 07/12/2019 0129   HCT 36.0 07/24/2017 1402   PLT 168 07/17/2019 0758   PLT 166 03/06/2018 0910   PLT 172 07/24/2017 1402   MCV 97.1 07/17/2019 0758   MCV 93.3 07/24/2017 1402   MCH 31.5 07/17/2019 0758   MCHC 32.5 07/17/2019 0758   RDW 15.1 07/17/2019 0758   RDW 15.6 (H) 07/24/2017 1402   LYMPHSABS 2.9 07/17/2019 0758   LYMPHSABS 2.9 07/24/2017 1402   MONOABS 2.5 (H) 07/17/2019 0758   MONOABS 1.8 (H) 07/24/2017 1402   EOSABS 0.4 07/17/2019 0758   EOSABS 1.1 (H) 07/24/2017 1402   BASOSABS 0.1 07/17/2019 0758   BASOSABS 0.2 (H) 07/24/2017 1402   . Lab Results  Component Value Date   RETICCTPCT 1.7 07/12/2019   RBC 2.76 (L) 07/17/2019   RETICCTABS 63.69 07/24/2017   . CMP Latest Ref Rng & Units 07/17/2019 07/16/2019 07/15/2019  Glucose 70 - 99 mg/dL 98 95 93  BUN 8 - 23 mg/dL 22 20 17   Creatinine 0.44 - 1.00 mg/dL 0.86 0.95 0.94  Sodium 135 - 145 mmol/L 132(L) 132(L) 134(L)  Potassium 3.5 - 5.1 mmol/L 4.1 4.0 4.1  Chloride 98 - 111 mmol/L 96(L) 97(L) 99  CO2 22 - 32 mmol/L 27 25 25   Calcium 8.9 - 10.3 mg/dL 9.0 9.0 9.3  Total Protein 6.5 - 8.1 g/dL 7.4 7.5 7.4  Total Bilirubin 0.3 - 1.2 mg/dL 0.7 0.7 0.5  Alkaline Phos 38 - 126 U/L 237(H) 238(H) 237(H)  AST 15 - 41 U/L 16 15 14(L)  ALT 0 - 44 U/L 10 11 11     PLAN -Labs reviewed. -Patient's bone marrow showed no evidence of transformation to acute leukemia. Looks overall unchanged other than increasing WBC count which are primarily mature neutrophils. -Currently developing some reticulocytopenia and anemia without much effect of hydroxyurea on her WBC count. We will hold off on hydroxyurea at this time -Patient had a CT-guided aspiration of her left SI  joint. Follow-up cultures and results. Antibiotics for possible septic arthritis/pneumonia as per hospitalist. -No other hematologic interventions in the hospital at this time. We will set up patient follow-up in 1 to 2 weeks upon discharge. -PT/OT evaluation for discharge planning.  Sullivan Lone MD MS Hematology/Oncology Physician [facility]

## 2019-07-18 ENCOUNTER — Telehealth: Payer: Self-pay | Admitting: Hematology

## 2019-07-18 NOTE — Telephone Encounter (Signed)
Scheduled appt per 12/11 sch message - unable to reach pt . Left message for patient with appt date and time

## 2019-07-18 NOTE — Progress Notes (Signed)
PROGRESS NOTE    Erika [name]  OZD:[id number] DOB: September 06, 1940 DOA: 07/10/2019 PCP: [name], MD  Outpatient Specialists:   Brief Narrative:   Patient is a 78 year old African-American female, morbidly obese with BMI of 48 kg/m, and past medical history significant for myeloproliferative neoplasm NOS-didnt see oncology for 7-92month, hypertension, asthma, paroxysmal atrial fibrillation and status post resection of lumbar spine mass.  Patient also has history of chronic lower back pain, with possible radiculopathy.  Patient presented with worsening right-sided lower back pain and the inability to ambulate over 2 days. -In the emergency room she was noted to have a white count of 107 compared to previous level of 61, creatinine of 1.4, uric acid of 14, ESR of 70, CT pelvis nonspecific, question septic arthritis versus sacroiliitis, seen by orthopedics did not feel this was consistent with septic arthritis -Seen by oncology for profound leukocytosis, concern for tumor lysis syndrome -Underwent bone marrow aspiration biopsy 12/7  Assessment & Plan:   Severe back pain/inability to ambulate/possible left-sided sacroiliitis versus septic left sacral iliac joint: -Patient has acute on chronic left hip and lower back pain along with radiculopathy -She was treated for paraspinal/lumbar TB in 2002, and also underwent hemilaminectomy of L4-L5 and S1, decompression of tumor and epidural space, which 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  -Seen by Orthopedics in consultation, infection felt to be less likely -However, given her worsening symptoms, limitations in ambulation and complex history of TB in her LS spine agree with MRI spine and left hip with and without contrast. [facility]  -MRI of spine negative for evidence of infectious process but left SI joint could not have septic arthritis ruled out.  -Patient underwent SI joint aspiration on 12/11, fluid Gram stain with no organism. Culture is NGTD x1 day. Will continue monitoring off Abx.  -She reports feeling better and being able to move her left leg after starting tizanidine which will be continued.  -If culture negative x2 days, may consider SI steroid injection for pain control.  Continue:  -PT OT (patient no evaluated yet) -Pain control -If infectious process ruled out, may have a trial of medrol (she reports feeling anxious and hyper with prednisone that she took for asthma in the past but is not sure if she has had medrol dose pack).  -Will continue tizanidine as needed as muscle relaxer.  -Continue heating pad, tylenol as needed.   Severe leukocytosis, myeloproliferative Neoplasm NOS: -WBC 107 on presentation,  -Oncology Dr. KIrene Limbo following -She is s/p bone marrow biopsy, results per Hem/Onc, with no transformation/leukemia. Hydroxyurea held.   Elevated uric acid level/tumor lysis syndrome -Uric acid was 14.1 on presentation. Down to 3.4 now.  -Calcium, potassium within normal range, kidney function improving  -Continue daily allopurinol  Paroxysmal atrial fibrillation: Heart rate is controlled Continue metoprolol 12.5 mg p.o. twice daily.  Apixaban 5 mg p.o. twice daily.  Continue telemetry    Morbid obesity: -Needs aggressive lifestyle modification  Possible lower lobe infiltrates as per chest x-ray on presentation: -Repeat CXR with no infiltrate and patient with no symptoms to indicate pneumonia.  -Continue monitoring off antibiotics.   Acute Kidney injury: -Improved with hydration, creatinine down to 0.9  DVT prophylaxis: Apixaban Code Status: Full code Family Communication:  none at bedside Disposition Plan: to be determined, may need rehab   Consultants:    Hematology/oncology  Orthopedics   I.R.   Procedures:  Bone Marrow biopsy scheduled for 07/13/19 Antimicrobials:   IV vancomycin 12/5-12/6  IV ceftriaxone  07/11/2019-07/12/2019.  IV cefepime discontinued on 12/6   Subjective: Patient seen and examined this afternoon. She has left hip pain when trying to move her left leg but none if the remains still.   Objective: Vitals:   07/18/19 0439 07/18/19 0806 07/18/19 0957 07/18/19 1357  BP: (!) 126/56  117/65 124/73  Pulse: 93  (!) 113 88  Resp: _0 Temp: 98.6 F (37 C)   98.1 F (36.7 C)  TempSrc: Oral   Oral  SpO2: 95% 95% 98% 93%  Weight:      Height:        Intake/Output Summary (Last 24 hours) at 07/18/2019 1736 Last data filed at 07/18/2019 1620 Gross per 24 hour  Intake 480 ml  Output 3000 ml  Net -2520 ml   Filed Weights   07/10/19 1639 07/11/19 1119 07/13/19 1058  Weight: 134.7 kg 135.1 kg 136 kg    Examination:  Gen: Morbidly obese pleasant female sitting up in bed awake, Alert, Oriented X 3, no distress.  HEENT: Neck supple, no JVD.  Lungs: No respiratory distress. No wheezing.  CVS: RRR Abd: soft, Non tender, non distended Extremities: No edema, slight improvement of bilateral leg abduction, adduction and flexion but still impaired due to pain in her hips, worse in the left. Distal pulses intact, no cyanosis, appropriately warm to touch. No tenderness to palpation.   Skin: no new rashes  Data Reviewed: I have personally reviewed following labs and imaging studies  CBC: Recent Labs  Lab 07/12/19 0129 07/13/19 0358 07/14/19 0542 07/17/19 0758  WBC 105.4* 109.5* 114.2* 101.7*  NEUTROABS 90.5* 93.2* 106.2* 89.3*  HGB 8.7* 9.1* 9.2* 8.7*  HCT 28.0*  27.7* 29.3* 29.2* 26.8*  MCV 99.3 100.0 99.0 97.1  PLT 139* 131* 138* 500   Basic Metabolic Panel: Recent Labs  Lab 07/12/19 0129 07/13/19 0358 07/14/19 0542 07/15/19 0611 07/16/19 0548 07/17/19 0514  NA 135 138  137 133* 134*  132* 132*  K 4.2 4.1  4.0 4.0 4.1 4.0 4.1  CL 102 102  103 99 99 97* 96*  CO2 21* _1 GLUCOSE 76 62*  63* 102* 93 95 98  BUN _2 CREATININE 1.31* 0.90  0.90 0.99 0.94 0.95 0.86  CALCIUM 8.8* 9.4  9.3 9.1 9.3 9.0 9.0  MG 2.0 1.8  --   --   --   --   PHOS  --  3.4 4.0  --   --   --    GFR: Estimated Creatinine Clearance: 79.1 mL/min (by C-G formula based on SCr of 0.86 mg/dL). Liver Function Tests: Recent Labs  Lab 07/13/19 0358 07/14/19 0542 07/15/19 0611 07/16/19 0548 07/17/19 0514  AST 16 15 14* 15 16  ALT _3 ALKPHOS 320* 328* 237* 238* 237*  BILITOT 0.4 0.8 0.5 0.7 0.7  PROT 7.3 7.6 7.4 7.5 7.4  ALBUMIN 2.9*  2.9* 3.1* 2.7* 2.8* 2.9*   No results for input(s): LIPASE, AMYLASE in the last 168 hours. No results for input(s): AMMONIA in the last 168 hours. Coagulation Profile: Recent Labs  Lab 07/13/19 0358  INR 1.2   Cardiac  Enzymes: Recent Labs  Lab 07/17/19 0514  CKTOTAL 74   BNP (last 3 results) No results for input(s): PROBNP in the last 8760 hours. HbA1C: No results for input(s): HGBA1C in the last 72 hours. CBG: No results for input(s): GLUCAP in the last 168 hours. Lipid Profile: No results for input(s): CHOL, HDL, LDLCALC, TRIG, CHOLHDL, LDLDIRECT in the last 72 hours. Thyroid Function Tests: No results for input(s): TSH, T4TOTAL, FREET4, T3FREE, THYROIDAB in the last 72 hours. Anemia Panel: No results for input(s): VITAMINB12, FOLATE, FERRITIN, TIBC, IRON, RETICCTPCT in the last 72 hours. Urine analysis:    Component Value Date/Time   COLORURINE YELLOW 07/11/2019 1443   APPEARANCEUR CLOUDY (A) 07/11/2019 1443   LABSPEC 1.021 07/11/2019 1443   PHURINE 5.0 07/11/2019 1443   GLUCOSEU NEGATIVE 07/11/2019 1443   HGBUR MODERATE (A) 07/11/2019 1443   BILIRUBINUR NEGATIVE 07/11/2019 1443   KETONESUR 5 (A) 07/11/2019 1443   PROTEINUR NEGATIVE 07/11/2019 1443   UROBILINOGEN 1.0 09/09/2012 0051    NITRITE NEGATIVE 07/11/2019 1443   LEUKOCYTESUR TRACE (A) 07/11/2019 1443    Recent Results (from the past 240 hour(s))  SARS CORONAVIRUS 2 (TAT 6-24 HRS) Nasopharyngeal Nasopharyngeal Swab     Status: None   Collection Time: 07/10/19  9:21 PM   Specimen: Nasopharyngeal Swab  Result Value Ref Range Status   SARS Coronavirus 2 NEGATIVE NEGATIVE Final    Comment: (NOTE) SARS-CoV-2 target nucleic acids are NOT DETECTED. The SARS-CoV-2 RNA is generally detectable in upper and lower respiratory specimens during the acute phase of infection. Negative results do not preclude SARS-CoV-2 infection, do not rule out co-infections with other pathogens, and should not be used as the sole basis for treatment or other patient management decisions. Negative results must be combined with clinical observations, patient history, and epidemiological information. The expected result is Negative. Fact Sheet for Patients: SugarRoll.be Fact Sheet for Healthcare Providers: https://www.woods-mathews.com/ This test is not yet approved or cleared by the [place] FDA and  has been authorized for detection and/or diagnosis of SARS-CoV-2 by FDA under an Emergency Use Authorization (EUA). This EUA will remain  in effect (meaning this test can be used) for the duration of the COVID-19 declaration under Section 56 4(b)(1) of the Act, 21 U.S.C. section 360bbb-3(b)(1), unless the authorization is terminated or revoked sooner. Performed at [facility], [place] [address] [place], [place] [address]   Aerobic/Anaerobic Culture (surgical/deep wound)     Status: None (Preliminary result)   Collection Time: 07/17/19 10:35 AM   Specimen: Wound; Synovial Fluid  Result Value Ref Range Status   Specimen Description   Final    WOUND LT SACROILIAC ASPIRATION Performed at [facility] [address] [place], [place] [address]    Special Requests   Final      Normal Performed at [facility], [place] [address], [place], [place] [address]    Gram Stain   Final    RARE WBC PRESENT, PREDOMINANTLY PMN NO ORGANISMS SEEN    Culture   Final    NO GROWTH < 24 HOURS Performed at [facility] [address], [place], [place] [address]    Report Status PENDING  Incomplete     Radiology Studies: IR Fluoro Guide Ndl Plmt / BX  Result Date: 07/17/2019 CLINICAL DATA:  Left hip pain. MR suggests left sacroiliitis, possibly septic arthritis PROCEDURE: LEFT SACROILIAC JOINT ASPIRATION UNDER FLUOROSCOPY TECHNIQUE: The procedure, risks (including but not limited to bleeding,  infection, organ damage ), benefits, and alternatives were explained to the patient. Questions regarding the procedure were encouraged and answered. The patient understands and consents to the procedure. patient placed prone on the fluoroscopy table. Operator donned sterile gloves and mask. A posterior approach was taken using a curved 20 gauge spinal needle. A scant amount of bloody fluid less than 1 mL could be aspirated from the joint despite vigorous aspiration and changes in needle position. The sample was sent for Gram stain and culture. The procedure was well-tolerated. FLUOROSCOPY TIME:  0.6 minute; 612 uGym2 DAP IMPRESSION: Technically successful left sacro-iliac joint aspiration under fluoroscopy Electronically Signed   By: Lucrezia Europe M.D.   On: 07/17/2019 13:46   Scheduled Meds: . allopurinol  150 mg Oral BID  . amLODipine  10 mg Oral Daily  . apixaban  5 mg Oral BID  . metoprolol tartrate  25 mg Oral BID  . mometasone-formoterol  2 puff Inhalation BID  . pantoprazole  40 mg Oral Daily  . polyethylene glycol  17 g Oral Daily  . pravastatin  20 mg Oral Daily   Continuous Infusions:    LOS: 8 days   Time spent: 15 minutes  1. Sinclair Grooms KennerlyMD

## 2019-07-19 LAB — CBC
HCT: 27.7 % — ABNORMAL LOW (ref 36.0–46.0)
Hemoglobin: 8.5 g/dL — ABNORMAL LOW (ref 12.0–15.0)
MCH: 30.5 pg (ref 26.0–34.0)
MCHC: 30.7 g/dL (ref 30.0–36.0)
MCV: 99.3 fL (ref 80.0–100.0)
Platelets: 223 10*3/uL (ref 150–400)
RBC: 2.79 MIL/uL — ABNORMAL LOW (ref 3.87–5.11)
RDW: 15.1 % (ref 11.5–15.5)
WBC: 95.2 10*3/uL (ref 4.0–10.5)
nRBC: 0 % (ref 0.0–0.2)

## 2019-07-19 LAB — BASIC METABOLIC PANEL
Anion gap: 13 (ref 5–15)
BUN: 23 mg/dL (ref 8–23)
CO2: 26 mmol/L (ref 22–32)
Calcium: 9.6 mg/dL (ref 8.9–10.3)
Chloride: 98 mmol/L (ref 98–111)
Creatinine, Ser: 1.01 mg/dL — ABNORMAL HIGH (ref 0.44–1.00)
GFR calc Af Amer: >60 mL/min (ref >60)
GFR calc non Af Amer: 54 mL/min — ABNORMAL LOW (ref >60)
Glucose, Bld: 29 mg/dL — CL (ref 70–99)
Potassium: 4.7 mmol/L (ref 3.5–5.1)
Sodium: 137 mmol/L (ref 135–145)

## 2019-07-19 LAB — GLUCOSE, CAPILLARY: Glucose-Capillary: 97 mg/dL (ref 70–99)

## 2019-07-19 NOTE — Progress Notes (Signed)
PROGRESS NOTE    Erika [name]  GXQ:[id number] DOB: 1940-08-29 DOA: 07/10/2019 PCP: [name], MD  Outpatient Specialists:   Brief Narrative:   Patient is a 78 year old African-American female, morbidly obese with BMI of 48 kg/m, and past medical history significant for myeloproliferative neoplasm NOS-didnt see oncology for 7-48month, hypertension, asthma, paroxysmal atrial fibrillation and status post resection of lumbar spine mass.  Patient also has history of chronic lower back pain, with possible radiculopathy.  Patient presented with worsening right-sided lower back pain and the inability to ambulate over 2 days. -In the emergency room she was noted to have a white count of 107 compared to previous level of 61, creatinine of 1.4, uric acid of 14, ESR of 70, CT pelvis nonspecific, question septic arthritis versus sacroiliitis, seen by orthopedics did not feel this was consistent with septic arthritis -Seen by oncology for profound leukocytosis, concern for tumor lysis syndrome -Underwent bone marrow aspiration biopsy 12/7  Assessment & Plan:   Severe back pain/inability to ambulate/possible left-sided sacroiliitis versus septic left sacral iliac joint: -Patient has acute on chronic left hip and lower back pain along with radiculopathy -She was treated for paraspinal/lumbar TB in 2002, and also underwent hemilaminectomy of L4-L5 and S1, decompression of tumor and epidural space, which 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  -Seen by Orthopedics in consultation, infection felt to be less likely -However, given her worsening symptoms, limitations in ambulation and complex history of TB in her LS spine agree with MRI spine and left hip with and without contrast. [facility]  -MRI of spine negative for evidence of infectious process but left SI joint could not have septic arthritis ruled out.  -Patient underwent SI joint aspiration on 12/11, fluid Gram stain with no organism. Culture is NGTD x1 day. Will continue monitoring off Abx.  -She reports feeling better and being able to move her left leg after starting tizanidine which will be continued.  -If culture negative x2 days, may consider SI steroid injection for pain control.  Continue:  -Pain control -Patient reports no pain in her left hip now with no indication for left hip injection.  -Will continue tizanidine as needed as muscle relaxer.  -Continue heating pad, tylenol as needed.  -Start PT and OT, will likely need Rehab placement.   Severe leukocytosis, myeloproliferative Neoplasm NOS: -WBC 107 on presentation,  -Oncology Dr. KIrene Limbo following -She is s/p bone marrow biopsy, results per Hem/Onc, with no transformation/leukemia. Hydroxyurea held.   Elevated uric acid level/tumor lysis syndrome -Uric acid was 14.1 on presentation. Down to 3.4 now.  -Calcium, potassium within normal range, kidney function improving  -Continue daily allopurinol  Paroxysmal atrial fibrillation: Heart rate is controlled Continue metoprolol 12.5 mg p.o. twice daily.  Apixaban 5 mg p.o. twice daily.  Continue telemetry    Morbid obesity: -Needs aggressive lifestyle modification  Possible lower lobe infiltrates as per chest x-ray on presentation: -Repeat CXR with no infiltrate and patient with no symptoms to indicate pneumonia.  -Continue monitoring off antibiotics.   Acute Kidney injury: -Improved with hydration, creatinine down to 0.9  DVT prophylaxis: Apixaban Code Status: Full code Family Communication: none at bedside Disposition Plan: to be determined, may need rehab   Consultants:   Hematology/oncology  Orthopedics   I.R.   Procedures:  Bone Marrow biopsy scheduled for  07/13/19 Antimicrobials:   IV vancomycin 12/5-12/6  IV ceftriaxone  07/11/2019-07/12/2019.  IV cefepime discontinued on 12/6   Subjective: She reports feeling better. She had a low blood sugar per blood work but repeat CBG showed nl blood sugar level, question possible lab error. She reports feeling better, was able to stand with help from PT but not able to walk much yet.   Objective: Vitals:   07/19/19 0550 07/19/19 0747 07/19/19 0935 07/19/19 1338  BP: (!) 125/51  133/62 127/68  Pulse: (!) 101  (!) 106 90  Resp: 18  18 20   Temp: 97.8 F (36.6 C)  98.1 F (36.7 C) 98.1 F (36.7 C)  TempSrc: Oral  Oral Oral  SpO2: 96% 95% 96% 95%  Weight:      Height:        Intake/Output Summary (Last 24 hours) at 07/19/2019 1540 Last data filed at 07/19/2019 1338 Gross per 24 hour  Intake 840 ml  Output 600 ml  Net 240 ml   Filed Weights   07/10/19 1639 07/11/19 1119 07/13/19 1058  Weight: 134.7 kg 135.1 kg 136 kg    Examination:  Gen: Morbidly obese pleasant female sitting up in bed awake, Alert, Oriented X 3, no distress.  HEENT: Neck supple, no JVD.  Lungs: No respiratory distress. No wheezing.  CVS: RRR Abd: soft, Non tender, non distended Extremities: No edema, improvement of bilateral leg abduction, adduction and flexion but still impaired due to weakness, denies hip pain. Distal pulses intact, no cyanosis, appropriately warm to touch. No tenderness to palpation.   Skin: no new rashes  Data Reviewed: I have personally reviewed following labs and imaging studies  CBC: Recent Labs  Lab 07/13/19 0358 07/14/19 0542 07/17/19 0758 07/19/19 0523  WBC 109.5* 114.2* 101.7* 95.2*  NEUTROABS 93.2* 106.2* 89.3*  --   HGB 9.1* 9.2* 8.7* 8.5*  HCT 29.3* 29.2* 26.8* 27.7*  MCV 100.0 99.0 97.1 99.3  PLT 131* 138* 168 485   Basic Metabolic Panel: Recent Labs  Lab 07/13/19 0358 07/14/19 0542 07/15/19 0611 07/16/19 0548 07/17/19 0514 07/19/19 0523  NA 138  137 133*  134* 132* 132* 137  K 4.1  4.0 4.0 4.1 4.0 4.1 4.7  CL 102  103 99 99 97* 96* 98  CO2 23  23 26 25 25 27 26   GLUCOSE 62*  63* 102* 93 95 98 29*  BUN 12  12 14 17 20 22 23   CREATININE 0.90  0.90 0.99 0.94 0.95 0.86 1.01*  CALCIUM 9.4  9.3 9.1 9.3 9.0 9.0 9.6  MG 1.8  --   --   --   --   --   PHOS 3.4 4.0  --   --   --   --    GFR: Estimated Creatinine Clearance: 67.3 mL/min (A) (by C-G formula based on SCr of 1.01 mg/dL (H)). Liver Function Tests: Recent Labs  Lab 07/13/19 0358 07/14/19 0542 07/15/19 0611 07/16/19 0548 07/17/19 0514  AST 16 15 14* 15 16  ALT 11 12 11 11 10   ALKPHOS 320* 328* 237* 238* 237*  BILITOT 0.4 0.8 0.5 0.7 0.7  PROT 7.3 7.6 7.4 7.5 7.4  ALBUMIN 2.9*  2.9* 3.1* 2.7* 2.8* 2.9*   No results for input(s): LIPASE, AMYLASE in the last 168 hours. No results for input(s): AMMONIA in the last 168 hours. Coagulation Profile: Recent Labs  Lab 07/13/19 0358  INR 1.2   Cardiac Enzymes: Recent Labs  Lab 07/17/19 0514  CKTOTAL 74   BNP (last 3 results) No results for input(s): PROBNP in the last 8760 hours. HbA1C: No results for input(s): HGBA1C in the last 72 hours. CBG: Recent Labs  Lab 07/19/19 0725  GLUCAP 97   Lipid Profile: No results for input(s): CHOL, HDL, LDLCALC, TRIG, CHOLHDL, LDLDIRECT in the last 72 hours. Thyroid Function Tests: No results for input(s): TSH, T4TOTAL, FREET4, T3FREE, THYROIDAB in the last 72 hours. Anemia Panel: No results for input(s): VITAMINB12, FOLATE, FERRITIN, TIBC, IRON, RETICCTPCT in the last 72 hours. Urine analysis:    Component Value Date/Time   COLORURINE YELLOW 07/11/2019 1443   APPEARANCEUR CLOUDY (A) 07/11/2019 1443   LABSPEC 1.021 07/11/2019 1443   PHURINE 5.0 07/11/2019 1443   GLUCOSEU NEGATIVE 07/11/2019 1443   HGBUR MODERATE (A) 07/11/2019 1443   BILIRUBINUR NEGATIVE 07/11/2019 1443   KETONESUR 5 (A) 07/11/2019 1443   PROTEINUR NEGATIVE 07/11/2019 1443   UROBILINOGEN 1.0 09/09/2012  0051   NITRITE NEGATIVE 07/11/2019 1443   LEUKOCYTESUR TRACE (A) 07/11/2019 1443    Recent Results (from the past 240 hour(s))  SARS CORONAVIRUS 2 (TAT 6-24 HRS) Nasopharyngeal Nasopharyngeal Swab     Status: None   Collection Time: 07/10/19  9:21 PM   Specimen: Nasopharyngeal Swab  Result Value Ref Range Status   SARS Coronavirus 2 NEGATIVE NEGATIVE Final    Comment: (NOTE) SARS-CoV-2 target nucleic acids are NOT DETECTED. The SARS-CoV-2 RNA is generally detectable in upper and lower respiratory specimens during the acute phase of infection. Negative results do not preclude SARS-CoV-2 infection, do not rule out co-infections with other pathogens, and should not be used as the sole basis for treatment or other patient management decisions. Negative results must be combined with clinical observations, patient history, and epidemiological information. The expected result is Negative. Fact Sheet for Patients: SugarRoll.be Fact Sheet for Healthcare Providers: https://www.woods-mathews.com/ This test is not yet approved or cleared by the [place] FDA and  has been authorized for detection and/or diagnosis of SARS-CoV-2 by FDA under an Emergency Use Authorization (EUA). This EUA will remain  in effect (meaning this test can be used) for the duration of the COVID-19 declaration under Section 56 4(b)(1) of the Act, 21 U.S.C. section 360bbb-3(b)(1), unless the authorization is terminated or revoked sooner. Performed at [facility], [place] [address], [place], [place] [address]   Aerobic/Anaerobic Culture (surgical/deep wound)     Status: None (Preliminary result)   Collection Time: 07/17/19 10:35 AM   Specimen: Wound; Synovial Fluid  Result Value Ref Range Status   Specimen Description   Final    WOUND LT SACROILIAC ASPIRATION Performed at [facility] [address], [place], [place] [address]    Special Requests    Final    Normal Performed at [facility], [place] [address] [place], [place] [address]    Gram Stain   Final    RARE WBC PRESENT, PREDOMINANTLY PMN NO ORGANISMS SEEN    Culture   Final    NO GROWTH 2 DAYS NO ANAEROBES ISOLATED; CULTURE IN PROGRESS FOR 5 DAYS Performed at [facility] [address], [place], [place] [address]    Report Status PENDING  Incomplete     Radiology Studies: No results found. Scheduled Meds: . allopurinol  150 mg Oral BID  . amLODipine  10 mg Oral Daily  . apixaban  5 mg Oral BID  .  metoprolol tartrate  25 mg Oral BID  . mometasone-formoterol  2 puff Inhalation BID  . pantoprazole  40 mg Oral Daily  . polyethylene glycol  17 g Oral Daily  . pravastatin  20 mg Oral Daily   Continuous Infusions:    LOS: 9 days   Time spent: 15 minutes  1. Sinclair Grooms KennerlyMD

## 2019-07-19 NOTE — Evaluation (Signed)
Occupational Therapy Evaluation Patient Details Name: Erika [name] MRN: [id number] DOB: [birth year] Today's Date: 07/19/2019    History of Present Illness 78 year old African-American female, morbidly obese with BMI of 48 kg/m, and past medical history significant for myeloproliferative neoplasm NOS-didnt see oncology for 7-62months, hypertension, asthma, paroxysmal atrial fibrillation and status post resection of lumbar spine mass.  Patient also has history of chronic lower back pain, with possible radiculopathy.  Patient presented with worsening right-sided lower back pain and the inability to ambulate over 2 days.   Clinical Impression   Pt admitted with the above diagnoses and presents with below problem list. Pt will benefit from continued acute OT to address the below listed deficits and maximize independence with basic ADLs prior to d/c to venue below. At baseline, pt is mod I with basic ADLs, mostly at home now due to [place], does drive herself to doctor's appointments. She has family who assist her with IADLs (ex. house cleaning). Pt presents with significant limitations to her mobility. Pt able to come to EOB position with +1-2 assist. Attempted to stand 3x but pt ultimately unable to clear hips from EOB. Currently pt is max-total A +2 with LB ADLs, min guard to min A with UB ADLs. Pt fatigued at end of session needing +2 max-total A to return to supine position.      Follow Up Recommendations  SNF    Equipment Recommendations  Other (comment)(TBD next venue)    Recommendations for Other Services       Precautions / Restrictions Precautions Precautions: Fall Restrictions Weight Bearing Restrictions: No      Mobility Bed Mobility Overal bed mobility: Needs Assistance Bed Mobility: Supine to Sit;Sit to Supine     Supine to sit: HOB elevated;Mod assist;+2 for safety/equipment Sit to supine: Max assist;+2 for physical assistance   General bed mobility comments: Assist to  advance BLE and piivot hips to full EOB position. Pt able to powerup trunk and utilize BUE to facilitate scooting to EOB position. + use of bed rails.   Transfers Overall transfer level: Needs assistance Equipment used: Rolling walker (2 wheeled)             General transfer comment: attempted 3x to stand from elevated bed height. Ultimately unable to clear hips from EOB. Therapist stabilizing walker for pt to pull up on (rollator at baseline). Pt with tendency to push out on walker vs down,     Balance Overall balance assessment: Needs assistance Sitting-balance support: Bilateral upper extremity supported;Single extremity supported;Feet supported Sitting balance-Leahy Scale: Fair         Standing balance comment: unable to come to standing position this session                           ADL either performed or assessed with clinical judgement   ADL Overall ADL's : Needs assistance/impaired Eating/Feeding: Set up;Sitting   Grooming: Set up;Min guard;Sitting   Upper Body Bathing: Set up;Min guard;Sitting   Lower Body Bathing: Maximal assistance;Total assistance;Bed level;Sitting/lateral leans;+2 for physical assistance;+2 for safety/equipment   Upper Body Dressing : Set up;Min guard;Sitting   Lower Body Dressing: Maximal assistance;Total assistance;+2 for physical assistance;+2 for safety/equipment;Sitting/lateral leans;Bed level                 General ADL Comments: Pt completed bed mobility, sat EOB a few minutes then 3x attempted to stand from elevated bed height. Ultimately unable to clear hips from EOB this  session.      Vision Baseline Vision/History: Wears glasses       Perception     Praxis      Pertinent Vitals/Pain Pain Assessment: Faces Faces Pain Scale: Hurts little more Pain Location: LLE>RLE Pain Descriptors / Indicators: Guarding;Spasm;Sore Pain Intervention(s): Monitored during session;Repositioned;Premedicated before  session;Limited activity within patient's tolerance     Hand Dominance     Extremity/Trunk Assessment Upper Extremity Assessment Upper Extremity Assessment: Generalized weakness   Lower Extremity Assessment Lower Extremity Assessment: Defer to PT evaluation   Cervical / Trunk Assessment Cervical / Trunk Assessment: Other exceptions Cervical / Trunk Exceptions: large body habitus   Communication Communication Communication: No difficulties   Cognition Arousal/Alertness: Awake/alert Behavior During Therapy: WFL for tasks assessed/performed Overall Cognitive Status: Within Functional Limits for tasks assessed                                     General Comments       Exercises     Shoulder Instructions      Home Living Family/patient expects to be discharged to:: Private residence Living Arrangements: Children Available Help at Discharge: Family;Available PRN/intermittently Type of Home: Apartment Home Access: Level entry     Home Layout: One level     Bathroom Shower/Tub: Occupational psychologist: Standard     Home Equipment: Environmental consultant - 4 wheels   Additional Comments: lives with one son, second son comes by to help out, adult grandaughter helps with housecleaning. Both sons work during the day      Prior Functioning/Environment Level of Independence: Independent with assistive device(s)        Comments: rollator. drives herself to doctor appointments. son's do grocery shopping this year due to [place] exposure concerns.        OT Problem List: Decreased strength;Decreased activity tolerance;Impaired balance (sitting and/or standing);Decreased knowledge of use of DME or AE;Decreased knowledge of precautions;Obesity;Pain      OT Treatment/Interventions: Self-care/ADL training;Therapeutic exercise;Energy conservation;DME and/or AE instruction;Therapeutic activities;Patient/family education;Balance training    OT Goals(Current goals can  be found in the care plan section) Acute Rehab OT Goals Patient Stated Goal: reduce pain, move better, regain independence OT Goal Formulation: With patient Time For Goal Achievement: 08/02/19 Potential to Achieve Goals: Good ADL Goals Pt Will Perform Grooming: with set-up;sitting Pt Will Perform Upper Body Bathing: with set-up;sitting Pt Will Perform Lower Body Bathing: with mod assist;sit to/from stand Pt Will Transfer to Toilet: with mod assist;ambulating;stand pivot transfer;bedside commode Pt Will Perform Toileting - Clothing Manipulation and hygiene: with mod assist;sit to/from stand Additional ADL Goal #1: Pt will complete bed mobility at mod A level to prepare for EOB/OOB ADLs.  OT Frequency: Min 2X/week   Barriers to D/C:            Co-evaluation PT/OT/SLP Co-Evaluation/Treatment: Yes Reason for Co-Treatment: For patient/therapist safety;To address functional/ADL transfers   OT goals addressed during session: ADL's and self-care      AM-PAC OT "6 Clicks" Daily Activity     Outcome Measure Help from another person eating meals?: None Help from another person taking care of personal grooming?: A Little Help from another person toileting, which includes using toliet, bedpan, or urinal?: Total Help from another person bathing (including washing, rinsing, drying)?: A Lot Help from another person to put on and taking off regular upper body clothing?: A Lot Help from another person to put  on and taking off regular lower body clothing?: Total 6 Click Score: 13   End of Session Equipment Utilized During Treatment: Gait belt;Rolling walker  Activity Tolerance: Patient limited by pain;Patient limited by fatigue Patient left: in bed;with call bell/phone within reach;with SCD's reapplied  OT Visit Diagnosis: Unsteadiness on feet (R26.81);Muscle weakness (generalized) (M62.81);Pain                Time: [id number] OT Time Calculation (min): 37 min Charges:  OT General  Charges $OT Visit: 1 Visit OT Evaluation $OT Eval Moderate Complexity: [place], OT Acute Rehabilitation Services Pager: [phone] Office: [phone]   Hortencia Pilar 07/19/2019, 11:16 AM

## 2019-07-19 NOTE — Evaluation (Signed)
Physical Therapy Evaluation Patient Details Name: Erika [name] MRN: [id number] DOB: [birth year] Today's Date: 07/19/2019   History of Present Illness  78 year old African-American female, morbidly obese with BMI of 48 kg/m, and past medical history significant for myeloproliferative neoplasm NOS-didnt see oncology for 7-67months, hypertension, asthma, paroxysmal atrial fibrillation and status post resection of lumbar spine mass.  Patient also has history of chronic lower back pain, with possible radiculopathy.  Patient presented with worsening right-sided lower back pain and the inability to ambulate over 2 days.  Clinical Impression  Pt admitted as above and presenting with functional mobility limitations 2* generalized weakness, bil LE pain, and obesity.  Pt currently requires significant assist of 2 for performance of all bed mobility tasks and would require use of mechanical lift to stand from bedside or transfer to chair.  Pt would benefit from follow up rehab at SNF level to maximize IND and safety prior to return home with limited assist.    Follow Up Recommendations SNF    Equipment Recommendations  None recommended by PT    Recommendations for Other Services       Precautions / Restrictions Precautions Precautions: Fall Restrictions Weight Bearing Restrictions: No      Mobility  Bed Mobility Overal bed mobility: Needs Assistance Bed Mobility: Supine to Sit;Sit to Supine;Rolling Rolling: Mod assist;Max assist;+2 for physical assistance;+2 for safety/equipment   Supine to sit: HOB elevated;Mod assist;+2 for safety/equipment Sit to supine: Max assist;+2 for physical assistance   General bed mobility comments: Assist to advance BLE and piivot hips to full EOB position. Pt able to powerup trunk and utilize BUE to facilitate scooting to EOB position. + use of bed rails.   Transfers Overall transfer level: Needs assistance Equipment used: Rolling walker (2  wheeled) Transfers: Sit to/from Stand           General transfer comment: attempted 3x to stand from elevated bed height. Ultimately unable to clear hips from EOB. Therapist stabilizing walker for pt to pull up on (rollator at baseline). Pt with tendency to push out on walker vs down,   Ambulation/Gait                Stairs            Wheelchair Mobility    Modified Rankin (Stroke Patients Only)       Balance Overall balance assessment: Needs assistance Sitting-balance support: Bilateral upper extremity supported;Single extremity supported;Feet supported Sitting balance-Leahy Scale: Fair         Standing balance comment: unable to come to standing position this session                             Pertinent Vitals/Pain Pain Assessment: Faces Faces Pain Scale: Hurts little more Pain Location: LLE>RLE Pain Descriptors / Indicators: Guarding;Spasm;Sore Pain Intervention(s): Limited activity within patient's tolerance;Monitored during session;Premedicated before session    Home Living Family/patient expects to be discharged to:: Private residence Living Arrangements: Children Available Help at Discharge: Family;Available PRN/intermittently Type of Home: Apartment Home Access: Level entry     Home Layout: One level Home Equipment: Walker - 4 wheels Additional Comments: lives with one son, second son comes by to help out, adult grandaughter helps with housecleaning. Both sons work during the day    Prior Function Level of Independence: Independent with assistive device(s)         Comments: rollator. drives herself to doctor appointments. son's do grocery shopping this  year due to COVID exposure concerns.     Hand Dominance        Extremity/Trunk Assessment   Upper Extremity Assessment Upper Extremity Assessment: Generalized weakness    Lower Extremity Assessment Lower Extremity Assessment: Generalized weakness    Cervical /  Trunk Assessment Cervical / Trunk Assessment: Other exceptions Cervical / Trunk Exceptions: large body habitus  Communication   Communication: No difficulties  Cognition Arousal/Alertness: Awake/alert Behavior During Therapy: WFL for tasks assessed/performed Overall Cognitive Status: Within Functional Limits for tasks assessed                                        General Comments      Exercises     Assessment/Plan    PT Assessment Patient needs continued PT services  PT Problem List Decreased strength;Decreased activity tolerance;Decreased balance;Decreased mobility;Decreased knowledge of use of DME;Pain;Obesity       PT Treatment Interventions DME instruction;Gait training;Functional mobility training;Therapeutic activities;Therapeutic exercise;Patient/family education;Balance training    PT Goals (Current goals can be found in the Care Plan section)  Acute Rehab PT Goals Patient Stated Goal: reduce pain, move better, regain independence PT Goal Formulation: With patient Time For Goal Achievement: 05/22/19 Potential to Achieve Goals: Fair    Frequency Min 3X/week   Barriers to discharge Decreased caregiver support Home alone during the day    Co-evaluation PT/OT/SLP Co-Evaluation/Treatment: Yes Reason for Co-Treatment: For patient/therapist safety PT goals addressed during session: Mobility/safety with mobility OT goals addressed during session: ADL's and self-care       AM-PAC PT "6 Clicks" Mobility  Outcome Measure Help needed turning from your back to your side while in a flat bed without using bedrails?: A Lot Help needed moving from lying on your back to sitting on the side of a flat bed without using bedrails?: A Lot Help needed moving to and from a bed to a chair (including a wheelchair)?: A Lot Help needed standing up from a chair using your arms (e.g., wheelchair or bedside chair)?: A Lot Help needed to walk in hospital room?:  Total Help needed climbing 3-5 steps with a railing? : Total 6 Click Score: 10    End of Session Equipment Utilized During Treatment: Gait belt Activity Tolerance: Patient limited by fatigue;Patient limited by pain Patient left: in bed;with call bell/phone within reach Nurse Communication: Mobility status PT Visit Diagnosis: Muscle weakness (generalized) (M62.81);Difficulty in walking, not elsewhere classified (R26.2);Pain Pain - part of body: Leg(Bil LEs)    Time: [id number] PT Time Calculation (min) (ACUTE ONLY): 37 min   Charges:   PT Evaluation $PT Eval Moderate Complexity: 1 Mod          [place] Pager [phone] Office [phone]   Sachiko Methot 07/19/2019, 12:40 PM

## 2019-07-20 LAB — BASIC METABOLIC PANEL
Anion gap: 11 (ref 5–15)
BUN: 21 mg/dL (ref 8–23)
CO2: 25 mmol/L (ref 22–32)
Calcium: 9.5 mg/dL (ref 8.9–10.3)
Chloride: 98 mmol/L (ref 98–111)
Creatinine, Ser: 0.86 mg/dL (ref 0.44–1.00)
GFR calc Af Amer: >60 mL/min (ref >60)
GFR calc non Af Amer: >60 mL/min (ref >60)
Glucose, Bld: 78 mg/dL (ref 70–99)
Potassium: 4.3 mmol/L (ref 3.5–5.1)
Sodium: 134 mmol/L — ABNORMAL LOW (ref 135–145)

## 2019-07-20 LAB — CBC WITH DIFFERENTIAL/PLATELET
Abs Immature Granulocytes: 5.06 10*3/uL — ABNORMAL HIGH (ref 0.00–0.07)
Basophils Absolute: 0.2 10*3/uL — ABNORMAL HIGH (ref 0.0–0.1)
Basophils Relative: 0 %
Eosinophils Absolute: 0.5 10*3/uL (ref 0.0–0.5)
Eosinophils Relative: 1 %
HCT: 29.6 % — ABNORMAL LOW (ref 36.0–46.0)
Hemoglobin: 9.2 g/dL — ABNORMAL LOW (ref 12.0–15.0)
Immature Granulocytes: 5 %
Lymphocytes Relative: 3 %
Lymphs Abs: 3.2 10*3/uL (ref 0.7–4.0)
MCH: 30.6 pg (ref 26.0–34.0)
MCHC: 31.1 g/dL (ref 30.0–36.0)
MCV: 98.3 fL (ref 80.0–100.0)
Monocytes Absolute: 2.8 10*3/uL — ABNORMAL HIGH (ref 0.1–1.0)
Monocytes Relative: 3 %
Neutro Abs: 84.8 10*3/uL — ABNORMAL HIGH (ref 1.7–7.7)
Neutrophils Relative %: 88 %
Platelets: 240 10*3/uL (ref 150–400)
RBC: 3.01 MIL/uL — ABNORMAL LOW (ref 3.87–5.11)
RDW: 15.4 % (ref 11.5–15.5)
WBC: 96.5 10*3/uL (ref 4.0–10.5)
nRBC: 0 % (ref 0.0–0.2)

## 2019-07-20 LAB — KAPPA/LAMBDA LIGHT CHAINS
Kappa free light chain: 117.5 mg/L — ABNORMAL HIGH (ref 3.3–19.4)
Kappa, lambda light chain ratio: 0.13 — ABNORMAL LOW (ref 0.26–1.65)
Lambda free light chains: 910.5 mg/L — ABNORMAL HIGH (ref 5.7–26.3)

## 2019-07-20 NOTE — Care Management Important Message (Signed)
Important Message  Patient Details IM Letter given to [place] Case Manager to present to the Patient Name: Erika [name] MRN: [id number] Date of Birth: November 16, 1940   Medicare Important Message Given:  Yes     Kerin Salen 07/20/2019, 1:13 PM

## 2019-07-20 NOTE — Plan of Care (Signed)
  Problem: Education: Goal: Knowledge of General Education information will improve Description: Including pain rating scale, medication(s)/side effects and non-pharmacologic comfort measures Outcome: Progressing   Problem: Clinical Measurements: Goal: Will remain free from infection Outcome: Progressing Goal: Cardiovascular complication will be avoided Outcome: Progressing   Problem: Activity: Goal: Risk for activity intolerance will decrease Outcome: Progressing   Problem: Elimination: Goal: Will not experience complications related to bowel motility Outcome: Progressing

## 2019-07-20 NOTE — Progress Notes (Signed)
PROGRESS NOTE    Erika [name]  IWL:[id number] DOB: 1940/10/09 DOA: 07/10/2019 PCP: [name], MD  Outpatient Specialists:   Brief Narrative:   Patient is a 78 year old African-American female, morbidly obese with BMI of 48 kg/m, and past medical history significant for myeloproliferative neoplasm NOS-didnt see oncology for 7-73month, hypertension, asthma, paroxysmal atrial fibrillation and status post resection of lumbar spine mass.  Patient also has history of chronic lower back pain, with possible radiculopathy.  Patient presented with worsening right-sided lower back pain and the inability to ambulate over 2 days. -In the emergency room she was noted to have a white count of 107 compared to previous level of 61, creatinine of 1.4, uric acid of 14, ESR of 70, CT pelvis nonspecific, question septic arthritis versus sacroiliitis, seen by orthopedics did not feel this was consistent with septic arthritis -Seen by oncology for profound leukocytosis, concern for tumor lysis syndrome -Underwent bone marrow aspiration biopsy 12/7  Assessment & Plan:   Severe back pain/inability to ambulate/possible left-sided sacroiliitis versus septic left sacral iliac joint: -Patient has acute on chronic left hip and lower back pain along with radiculopathy -She was treated for paraspinal/lumbar TB in 2002, and also underwent hemilaminectomy of L4-L5 and S1, decompression of tumor and epidural space, which 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  -Seen by Orthopedics in consultation, infection felt to be less likely -However, given her worsening symptoms, limitations in ambulation and complex history of TB in her LS spine agree with MRI spine and left hip with and without contrast. [facility]  -MRI of spine negative for evidence of infectious process but left SI joint could not have septic arthritis ruled out.  -Patient underwent SI joint aspiration on 12/11, fluid Gram stain with no organism. Culture is NGTD x3 days. Will continue monitoring off Abx.  -She reports feeling better and being able to move her left leg after starting tizanidine which will be continued.    Continue:  -Pain control -Patient reports no pain in her left hip now with no indication for left hip injection.  -Will continue tizanidine as needed as muscle relaxer.  -Continue heating pad, tylenol as needed.  -Continue PT and OT, will likely need Rehab placement. CSW consulted and actively working on placement.   Severe leukocytosis, myeloproliferative Neoplasm NOS: -WBC 107 on presentation,  -Oncology Dr. KIrene Limbo following -She is s/p bone marrow biopsy, results per Hem/Onc, with no transformation/leukemia. Hydroxyurea held.   Elevated uric acid level/tumor lysis syndrome -Uric acid was 14.1 on presentation. Down to 3.4 now.  -Calcium, potassium within normal range, kidney function improving  -Continue daily allopurinol  Paroxysmal atrial fibrillation: Heart rate is controlled now but was slightly elevated earlier today Continue metoprolol 12.5 mg p.o. twice daily.  Apixaban 5 mg p.o. twice daily.  Continue telemetry    Morbid obesity: -Needs aggressive lifestyle modification  Possible lower lobe infiltrates as per chest x-ray on presentation: -Repeat CXR with no infiltrate and patient with no symptoms to indicate pneumonia.  -Continue monitoring off antibiotics.   Acute Kidney injury: -Improved with hydration, creatinine down to 0.9  DVT prophylaxis: Apixaban Code Status: Full code Family Communication: none at bedside Disposition Plan: to be determined, may need rehab   Consultants:  Hematology/oncology  Orthopedics   I.R.   Procedures:  Bone Marrow biopsy  scheduled for 07/13/19 Antimicrobials:   IV vancomycin 12/5-12/6  IV ceftriaxone  07/11/2019-07/12/2019.  IV cefepime discontinued on 12/6   Subjective: Patient seen and examined this afternoon. She reports feeling better. Able to move her bilateral feet a bit more, not able to flex her hips but reports that her left hip pain is improving.  Objective: Vitals:   07/20/19 0549 07/20/19 0832 07/20/19 0939 07/20/19 1340  BP: 123/79 (!) 142/76  134/72  Pulse: (!) 112 (!) 109  94  Resp: 20 20  19   Temp: 98 F (36.7 C) (!) 97.5 F (36.4 C)  98.3 F (36.8 C)  TempSrc:  Oral    SpO2: 97% 95% 96% 97%  Weight:      Height:        Intake/Output Summary (Last 24 hours) at 07/20/2019 2007 Last data filed at 07/20/2019 1345 Gross per 24 hour  Intake 730 ml  Output 2400 ml  Net -1670 ml   Filed Weights   07/10/19 1639 07/11/19 1119 07/13/19 1058  Weight: 134.7 kg 135.1 kg 136 kg    Examination:  Gen: Morbidly obese pleasant female sitting up in bed awake, Alert, Oriented X 3, no distress.  HEENT: Neck supple, no JVD.  Lungs: No respiratory distress. No wheezing.  CVS: RRR Abd: soft, Non tender, non distended Extremities: No edema, improvement of bilateral leg abduction, adduction and flexion but still impaired due to weakness, denies hip pain. Distal pulses intact, no cyanosis, appropriately warm to touch. No tenderness to palpation.   Skin: no new rashes  Data Reviewed: I have personally reviewed following labs and imaging studies  CBC: Recent Labs  Lab 07/14/19 0542 07/17/19 0758 07/19/19 0523 07/20/19 0545  WBC 114.2* 101.7* 95.2* 96.5*  NEUTROABS 106.2* 89.3*  --  84.8*  HGB 9.2* 8.7* 8.5* 9.2*  HCT 29.2* 26.8* 27.7* 29.6*  MCV 99.0 97.1 99.3 98.3  PLT 138* 168 223 161   Basic Metabolic Panel: Recent Labs  Lab 07/14/19 0542 07/15/19 0611 07/16/19 0548 07/17/19 0514 07/19/19 0523 07/20/19 0545  NA 133* 134* 132* 132* 137 134*  K 4.0 4.1 4.0 4.1 4.7 4.3    CL 99 99 97* 96* 98 98  CO2 26 25 25 27 26 25   GLUCOSE 102* 93 95 98 29* 78  BUN 14 17 20 22 23 21   CREATININE 0.99 0.94 0.95 0.86 1.01* 0.86  CALCIUM 9.1 9.3 9.0 9.0 9.6 9.5  PHOS 4.0  --   --   --   --   --    GFR: Estimated Creatinine Clearance: 79.1 mL/min (by C-G formula based on SCr of 0.86 mg/dL). Liver Function Tests: Recent Labs  Lab 07/14/19 0542 07/15/19 0611 07/16/19 0548 07/17/19 0514  AST 15 14* 15 16  ALT 12 11 11 10   ALKPHOS 328* 237* 238* 237*  BILITOT 0.8 0.5 0.7 0.7  PROT 7.6 7.4 7.5 7.4  ALBUMIN 3.1* 2.7* 2.8* 2.9*   No results for input(s): LIPASE, AMYLASE in the last 168 hours. No results for input(s): AMMONIA in the last 168 hours. Coagulation Profile: No results for input(s): INR, PROTIME in the last 168 hours. Cardiac Enzymes: Recent Labs  Lab 07/17/19 0514  CKTOTAL 74   BNP (last 3 results) No results for input(s): PROBNP in the last 8760 hours. HbA1C: No results for input(s): HGBA1C in the last 72 hours. CBG: Recent Labs  Lab 07/19/19 0725  GLUCAP  97   Lipid Profile: No results for input(s): CHOL, HDL, LDLCALC, TRIG, CHOLHDL, LDLDIRECT in the last 72 hours. Thyroid Function Tests: No results for input(s): TSH, T4TOTAL, FREET4, T3FREE, THYROIDAB in the last 72 hours. Anemia Panel: No results for input(s): VITAMINB12, FOLATE, FERRITIN, TIBC, IRON, RETICCTPCT in the last 72 hours. Urine analysis:    Component Value Date/Time   COLORURINE YELLOW 07/11/2019 1443   APPEARANCEUR CLOUDY (A) 07/11/2019 1443   LABSPEC 1.021 07/11/2019 1443   PHURINE 5.0 07/11/2019 1443   GLUCOSEU NEGATIVE 07/11/2019 1443   HGBUR MODERATE (A) 07/11/2019 1443   BILIRUBINUR NEGATIVE 07/11/2019 1443   KETONESUR 5 (A) 07/11/2019 1443   PROTEINUR NEGATIVE 07/11/2019 1443   UROBILINOGEN 1.0 09/09/2012 0051   NITRITE NEGATIVE 07/11/2019 1443   LEUKOCYTESUR TRACE (A) 07/11/2019 1443    Recent Results (from the past 240 hour(s))  SARS CORONAVIRUS 2 (TAT  6-24 HRS) Nasopharyngeal Nasopharyngeal Swab     Status: None   Collection Time: 07/10/19  9:21 PM   Specimen: Nasopharyngeal Swab  Result Value Ref Range Status   SARS Coronavirus 2 NEGATIVE NEGATIVE Final    Comment: (NOTE) SARS-CoV-2 target nucleic acids are NOT DETECTED. The SARS-CoV-2 RNA is generally detectable in upper and lower respiratory specimens during the acute phase of infection. Negative results do not preclude SARS-CoV-2 infection, do not rule out co-infections with other pathogens, and should not be used as the sole basis for treatment or other patient management decisions. Negative results must be combined with clinical observations, patient history, and epidemiological information. The expected result is Negative. Fact Sheet for Patients: SugarRoll.be Fact Sheet for Healthcare Providers: https://www.woods-mathews.com/ This test is not yet approved or cleared by the [place] FDA and  has been authorized for detection and/or diagnosis of SARS-CoV-2 by FDA under an Emergency Use Authorization (EUA). This EUA will remain  in effect (meaning this test can be used) for the duration of the COVID-19 declaration under Section 56 4(b)(1) of the Act, 21 U.S.C. section 360bbb-3(b)(1), unless the authorization is terminated or revoked sooner. Performed at [facility], [place] [address], [place], [place] [address]   Aerobic/Anaerobic Culture (surgical/deep wound)     Status: None (Preliminary result)   Collection Time: 07/17/19 10:35 AM   Specimen: Wound; Synovial Fluid  Result Value Ref Range Status   Specimen Description   Final    WOUND LT SACROILIAC ASPIRATION Performed at [place] [address], [place], [place] [address]    Special Requests   Final    Normal Performed at [facility], [place] [address] [place], [place] [address]    Gram Stain   Final    RARE WBC PRESENT,  PREDOMINANTLY PMN NO ORGANISMS SEEN    Culture   Final    NO GROWTH 3 DAYS NO ANAEROBES ISOLATED; CULTURE IN PROGRESS FOR 5 DAYS Performed at [facility] [address], [place], [place] [address]    Report Status PENDING  Incomplete     Radiology Studies: No results found. Scheduled Meds: . allopurinol  150 mg Oral BID  . amLODipine  10 mg Oral Daily  . apixaban  5 mg Oral BID  . metoprolol tartrate  25 mg Oral BID  . mometasone-formoterol  2 puff Inhalation BID  . pantoprazole  40 mg Oral Daily  . polyethylene glycol  17 g Oral Daily  . pravastatin  20 mg Oral Daily   Continuous Infusions:    LOS: 10  days   Time spent: 15 minutes  1. Sinclair Grooms KennerlyMD

## 2019-07-21 ENCOUNTER — Encounter (HOSPITAL_COMMUNITY): Payer: Self-pay | Admitting: Hematology

## 2019-07-21 NOTE — Progress Notes (Signed)
PROGRESS NOTE    Erika [name]  CHE:[id number] DOB: Feb 10, 1941 DOA: 07/10/2019 PCP: [name], MD  Outpatient Specialists:   Brief Narrative:   Patient is a 78 year old African-American female, morbidly obese with BMI of 48 kg/m, and past medical history significant for myeloproliferative neoplasm NOS-didnt see oncology for 7-6month, hypertension, asthma, paroxysmal atrial fibrillation and status post resection of lumbar spine mass.  Patient also has history of chronic lower back pain, with possible radiculopathy.  Patient presented with worsening right-sided lower back pain and the inability to ambulate over 2 days. -In the emergency room she was noted to have a white count of 107 compared to previous level of 61, creatinine of 1.4, uric acid of 14, ESR of 70, CT pelvis nonspecific, question septic arthritis versus sacroiliitis, seen by orthopedics did not feel this was consistent with septic arthritis -Seen by oncology for profound leukocytosis, concern for tumor lysis syndrome -Underwent bone marrow aspiration biopsy 12/7  Assessment & Plan:   Severe back pain/inability to ambulate/possible left-sided sacroiliitis versus septic left sacral iliac joint: -Patient has acute on chronic left hip and lower back pain along with radiculopathy -She was treated for paraspinal/lumbar TB in 2002, and also underwent hemilaminectomy of L4-L5 and S1, decompression of tumor and epidural space, which 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  -Seen by Orthopedics in consultation, infection felt to be less likely -However, given her worsening symptoms, limitations in ambulation and complex history of TB in her LS spine agree with MRI spine and left hip with and without contrast. [facility]  -MRI of spine negative for evidence of infectious process but left SI joint could not have septic arthritis ruled out.  -Patient underwent SI joint aspiration on 12/11, fluid Gram stain with no organism. Culture is NGTD x3 days. Will continue monitoring off Abx.  -She reports feeling better and being able to move her left leg after starting tizanidine which will be continued.    Continue:  -Pain control -Patient reports no pain in her left hip now with no indication for left hip injection.  -Will continue tizanidine as needed as muscle relaxer.  -Continue heating pad, tylenol as needed.  -Continue PT and OT, will likely need Rehab placement. CSW consulted and actively working on placement. Patient requests another PT OT eval as she feels like she can move her legs better today.   Severe leukocytosis, myeloproliferative Neoplasm NOS: -WBC 107 on presentation,  -Oncology Dr. KIrene Limbo following -She is s/p bone marrow biopsy, results per Hem/Onc, with no transformation/leukemia. Hydroxyurea held.   Elevated uric acid level/tumor lysis syndrome -Uric acid was 14.1 on presentation. Down to 3.4 now.  -Calcium, potassium within normal range, kidney function improving  -Continue daily allopurinol, even after discharge  Paroxysmal atrial fibrillation: Heart rate is controlled now but was slightly elevated earlier today Continue metoprolol 12.5 mg p.o. twice daily.  Apixaban 5 mg p.o. twice daily.  Continue telemetry    Morbid obesity: -Needs aggressive lifestyle modification  Possible lower lobe infiltrates as per chest x-ray on presentation: -Repeat CXR with no infiltrate and patient with no symptoms to indicate pneumonia.  -Continue monitoring off antibiotics.   Acute Kidney injury: -Improved with hydration, creatinine down to 0.9  DVT prophylaxis: Apixaban Code Status:  Full code Family Communication: none at bedside Disposition Plan: to be determined, may need  rehab   Consultants:   Hematology/oncology  Orthopedics   I.R.   Procedures:  Bone Marrow biopsy scheduled for 07/13/19 Antimicrobials:   IV vancomycin 12/5-12/6  IV ceftriaxone  07/11/2019-07/12/2019.  IV cefepime discontinued on 12/6   Subjective:  She reports feeling even better today. Denies left hip pain.  Objective: Vitals:   07/20/19 2007 07/20/19 2038 07/21/19 0705 07/21/19 1310  BP:  129/63 120/69 (!) 155/73  Pulse:  99 94 75  Resp:    16  Temp:  97.7 F (36.5 C) 98.2 F (36.8 C) 98 F (36.7 C)  TempSrc:  Oral  Oral  SpO2: 97% 94% 95% 97%  Weight:      Height:        Intake/Output Summary (Last 24 hours) at 07/21/2019 1348 Last data filed at 07/20/2019 2100 Gross per 24 hour  Intake -  Output 500 ml  Net -500 ml   Filed Weights   07/10/19 1639 07/11/19 1119 07/13/19 1058  Weight: 134.7 kg 135.1 kg 136 kg    Examination:  Gen: Morbidly obese pleasant female sitting up in bed awake, Alert, Oriented X 3, no distress.  HEENT: Neck supple, no JVD.  Lungs: No respiratory distress. No wheezing.  CVS: RRR Abd: soft, Non tender, non distended Extremities: No edema, improvement of bilateral leg abduction, adduction and flexion but still impaired due to weakness, denies hip pain. Distal pulses intact, no cyanosis, appropriately warm to touch. No tenderness to palpation.   Skin: no new rashes  Data Reviewed: I have personally reviewed following labs and imaging studies  CBC: Recent Labs  Lab 07/17/19 0758 07/19/19 0523 07/20/19 0545  WBC 101.7* 95.2* 96.5*  NEUTROABS 89.3*  --  84.8*  HGB 8.7* 8.5* 9.2*  HCT 26.8* 27.7* 29.6*  MCV 97.1 99.3 98.3  PLT 168 223 338   Basic Metabolic Panel: Recent Labs  Lab 07/15/19 0611 07/16/19 0548 07/17/19 0514 07/19/19 0523 07/20/19 0545  NA 134* 132* 132* 137 134*  K 4.1 4.0 4.1 4.7 4.3  CL 99 97* 96* 98 98  CO2 25 25 27 26 25   GLUCOSE 93 95 98 29* 78  BUN 17 20 22 23 21   CREATININE 0.94 0.95  0.86 1.01* 0.86  CALCIUM 9.3 9.0 9.0 9.6 9.5   GFR: Estimated Creatinine Clearance: 79.1 mL/min (by C-G formula based on SCr of 0.86 mg/dL). Liver Function Tests: Recent Labs  Lab 07/15/19 0611 07/16/19 0548 07/17/19 0514  AST 14* 15 16  ALT 11 11 10   ALKPHOS 237* 238* 237*  BILITOT 0.5 0.7 0.7  PROT 7.4 7.5 7.4  ALBUMIN 2.7* 2.8* 2.9*   No results for input(s): LIPASE, AMYLASE in the last 168 hours. No results for input(s): AMMONIA in the last 168 hours. Coagulation Profile: No results for input(s): INR, PROTIME in the last 168 hours. Cardiac Enzymes: Recent Labs  Lab 07/17/19 0514  CKTOTAL 74   BNP (last 3 results) No results for input(s): PROBNP in the last 8760 hours. HbA1C: No results for input(s): HGBA1C in the last 72 hours. CBG: Recent Labs  Lab 07/19/19 0725  GLUCAP 97   Lipid Profile: No results for input(s): CHOL, HDL, LDLCALC, TRIG, CHOLHDL, LDLDIRECT in the last 72 hours. Thyroid Function Tests: No results for input(s): TSH, T4TOTAL, FREET4, T3FREE, THYROIDAB in the last 72 hours. Anemia Panel: No results for input(s): VITAMINB12, FOLATE, FERRITIN, TIBC, IRON, RETICCTPCT in the last  72 hours. Urine analysis:    Component Value Date/Time   COLORURINE YELLOW 07/11/2019 1443   APPEARANCEUR CLOUDY (A) 07/11/2019 1443   LABSPEC 1.021 07/11/2019 1443   PHURINE 5.0 07/11/2019 1443   GLUCOSEU NEGATIVE 07/11/2019 1443   HGBUR MODERATE (A) 07/11/2019 1443   BILIRUBINUR NEGATIVE 07/11/2019 1443   KETONESUR 5 (A) 07/11/2019 1443   PROTEINUR NEGATIVE 07/11/2019 1443   UROBILINOGEN 1.0 09/09/2012 0051   NITRITE NEGATIVE 07/11/2019 1443   LEUKOCYTESUR TRACE (A) 07/11/2019 1443    Recent Results (from the past 240 hour(s))  Aerobic/Anaerobic Culture (surgical/deep wound)     Status: None (Preliminary result)   Collection Time: 07/17/19 10:35 AM   Specimen: Wound; Synovial Fluid  Result Value Ref Range Status   Specimen Description   Final    WOUND LT  SACROILIAC ASPIRATION Performed at [facility], [place] [address] [place], [place] [address]    Special Requests   Final    Normal Performed at [facility], [place] [address] [place], [place] [address]    Gram Stain   Final    RARE WBC PRESENT, PREDOMINANTLY PMN NO ORGANISMS SEEN    Culture   Final    NO GROWTH 4 DAYS NO ANAEROBES ISOLATED; CULTURE IN PROGRESS FOR 5 DAYS Performed at [place] [address], [place], [place] [address]    Report Status PENDING  Incomplete     Radiology Studies: No results found. Scheduled Meds: . allopurinol  150 mg Oral BID  . amLODipine  10 mg Oral Daily  . apixaban  5 mg Oral BID  . metoprolol tartrate  25 mg Oral BID  . mometasone-formoterol  2 puff Inhalation BID  . pantoprazole  40 mg Oral Daily  . polyethylene glycol  17 g Oral Daily  . pravastatin  20 mg Oral Daily   Continuous Infusions:    LOS: 11 days   Time spent: 15 minutes  1. Sinclair Grooms KennerlyMD

## 2019-07-21 NOTE — TOC Initial Note (Signed)
Transition of Care [facility]) - Initial/Assessment Note    Patient Details  Name: GINI CAPUTO MRN: [id number] Date of Birth: Oct 19, 1940  [facility] CM/SW Contact:    Wende Neighbors, LCSW Phone Number: 07/21/2019, 3:49 PM  Clinical Narrative:    CSW met patient at bedside with son Gerald Stabs present during assessment. CSW stated that physical therapy recommended a short term rehab stay for pateint. Patient stated  She wants to go home with [facility]. CSW explained the difference between SNF and HH. Patient stated son lives with her but son stated that he goes to work during the day so he will be unable to assist patient with her needs during the day. Patient stated that she thinks she can get her other son's girlfriend to be with her during the day. Patient agreed to have [facility] fax her out but stated she would like time to think about decision              Expected Discharge Plan: Skilled Nursing Facility Barriers to Discharge: Insurance Authorization, SNF Pending bed offer, Other (comment)(pt having a hard time making a decision snf vs hh)   Patient Goals and CMS Choice Patient states their goals for this hospitalization and ongoing recovery are:: to be home CMS Medicare.gov Compare Post Acute Care list provided to:: Patient Choice offered to / list presented to : Patient  Expected Discharge Plan and Services Expected Discharge Plan: [place] In-house Referral: Clinical Social Work Discharge Planning Services: CM Consult Post Acute Care Choice: [place] Living arrangements for the past 2 months: Apartment                                      Prior Living Arrangements/Services Living arrangements for the past 2 months: Apartment Lives with:: Self, Adult Children Patient language and need for interpreter reviewed:: Yes Do you feel safe going back to the place where you live?: Yes      Need for Family Participation in Patient Care: Yes  (Comment) Care giver support system in place?: Yes (comment)   Criminal Activity/Legal Involvement Pertinent to Current Situation/Hospitalization: No - Comment as needed  Activities of Daily Living Home Assistive Devices/Equipment: None ADL Screening (condition at time of admission) Patient's cognitive ability adequate to safely complete daily activities?: Yes Is the patient deaf or have difficulty hearing?: No Does the patient have difficulty seeing, even when wearing glasses/contacts?: Yes Does the patient have difficulty concentrating, remembering, or making decisions?: No Patient able to express need for assistance with ADLs?: Yes Does the patient have difficulty dressing or bathing?: Yes Independently performs ADLs?: No Does the patient have difficulty walking or climbing stairs?: Yes Weakness of Legs: Both Weakness of Arms/Hands: None  Permission Sought/Granted Permission sought to share information with : Family Supports    Share Information with NAME: Pia Mau     Permission granted to share info w Relationship: son  Permission granted to share info w Contact Information: [phone]  Emotional Assessment Appearance:: Appears stated age Attitude/Demeanor/Rapport: Engaged Affect (typically observed): Accepting Orientation: : Oriented to Place, Oriented to Self, Oriented to  Time, Oriented to Situation Alcohol / Substance Use: Not Applicable Psych Involvement: No (comment)  Admission diagnosis:  Sacroiliitis (HCC) [M46.1] Wheeze [R06.2] Tumor lysis syndrome [E88.3] AKI (acute kidney injury) ([place]) [N17.9] Myeloproliferative disorder ([place]) [D47.1] Patient Active Problem List   Diagnosis Date Noted  . Tumor  lysis syndrome   . Hyperuricemia   . Thrombocytopenia ([place])   . Myeloproliferative disorder ([place]) 07/10/2019  . AKI (acute kidney injury) ([place]) 07/10/2019  . AF (paroxysmal atrial fibrillation) ([place]) 06/02/2017  . Leukocytosis 05/29/2017  . Anemia  05/29/2017  . Leg swelling 02/23/2016  . Hand cramps 11/22/2015  . GERD (gastroesophageal reflux disease) 02/09/2013  . OA (osteoarthritis) of knee 01/29/2012  . OA (osteoarthritis of spine) 10/18/2011  . Healthcare maintenance 04/25/2011  . Hypokalemia 02/26/2008  . Moderate persistent asthma 02/10/2008  . Hyperlipidemia 08/04/2007  . H/O Herpes zoster 08/29/2006  . Obesity, Class III, BMI 40-49.9 (morbid obesity) ([place]) 08/29/2006  . History of breast cyst 08/29/2006  . Essential hypertension 08/27/2006  . 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

## 2019-07-21 NOTE — Plan of Care (Signed)
Continue with current POC

## 2019-07-21 NOTE — NC FL2 (Signed)
[place] LEVEL OF CARE SCREENING TOOL     IDENTIFICATION  Patient Name: Erika [name] Birthdate: July 28, 1941 Sex: female Admission Date (Current Location): 07/10/2019  [facility] and [place] Number:  Herbalist and Address:  [facility],  [place] [address] [place]      Provider Number: [id number]  Attending Physician Name and Address:  Blain Pais, MD  Relative Name and Phone Number:  Gerald Stabs [phone]    Current Level of Care: Hospital Recommended Level of Care: [place] Prior Approval Number:    Date Approved/Denied:   PASRR Number: [id number]  Discharge Plan: SNF    Current Diagnoses: Patient Active Problem List   Diagnosis Date Noted  . Tumor lysis syndrome   . Hyperuricemia   . Thrombocytopenia ([place])   . Myeloproliferative disorder ([place]) 07/10/2019  . AKI (acute kidney injury) ([place]) 07/10/2019  . AF (paroxysmal atrial fibrillation) ([place]) 06/02/2017  . Leukocytosis 05/29/2017  . Anemia 05/29/2017  . Leg swelling 02/23/2016  . Hand cramps 11/22/2015  . GERD (gastroesophageal reflux disease) 02/09/2013  . OA (osteoarthritis) of knee 01/29/2012  . OA (osteoarthritis of spine) 10/18/2011  . Healthcare maintenance 04/25/2011  . Hypokalemia 02/26/2008  . Moderate persistent asthma 02/10/2008  . Hyperlipidemia 08/04/2007  . H/O Herpes zoster 08/29/2006  . Obesity, Class III, BMI 40-49.9 (morbid obesity) ([place]) 08/29/2006  . History of breast cyst 08/29/2006  . Essential hypertension 08/27/2006  . 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    Orientation RESPIRATION BLADDER Height & Weight     Self, Time, Situation, Place  Normal External catheter, Incontinent Weight: 299 lb 13.2 oz (136 kg) Height:  5\' 6"  (167.6 cm)  BEHAVIORAL SYMPTOMS/MOOD NEUROLOGICAL BOWEL NUTRITION STATUS      Continent  Diet(regular)  AMBULATORY STATUS COMMUNICATION OF NEEDS Skin   Limited Assist Verbally                         Personal Care Assistance Level of Assistance  Bathing, Dressing, Feeding Bathing Assistance: Maximum assistance Feeding assistance: Independent Dressing Assistance: Maximum assistance     Functional Limitations Info  Sight, Hearing, Speech Sight Info: Adequate Hearing Info: Adequate Speech Info: Adequate    SPECIAL CARE FACTORS FREQUENCY  PT (By licensed PT), OT (By licensed OT)     PT Frequency: 5x wk OT Frequency: 5x wk            Contractures Contractures Info: Not present    Additional Factors Info  Code Status, Allergies Code Status Info: full code Allergies Info: Prednisone           Current Medications (07/21/2019):  This is the current hospital active medication list Current Facility-Administered Medications  Medication Dose Route Frequency Provider Last Rate Last Admin  . albuterol (PROVENTIL) (2.5 MG/3ML) 0.083% nebulizer solution 2.5 mg  2.5 mg Nebulization Q6H PRN Adele Barthel D, MD      . allopurinol (ZYLOPRIM) tablet 150 mg  150 mg Oral BID Adele Barthel D, MD   150 mg at 07/21/19 0949  . amLODipine (NORVASC) tablet 10 mg  10 mg Oral Daily Adele Barthel D, MD   10 mg at 07/21/19 0949  . apixaban (ELIQUIS) tablet 5 mg  5 mg Oral BID Adele Barthel D, MD   5 mg at 07/21/19 0950  . HYDROcodone-acetaminophen (NORCO) 7.5-325 MG per tablet 1 tablet  1  tablet Oral Q6H PRN Blain Pais, MD   1 tablet at 07/21/19 0754  . metoprolol tartrate (LOPRESSOR) tablet 25 mg  25 mg Oral BID Adele Barthel D, MD   25 mg at 07/21/19 0950  . mometasone-formoterol (DULERA) 200-5 MCG/ACT inhaler 2 puff  2 puff Inhalation BID Adele Barthel D, MD   2 puff at 07/21/19 0807  . pantoprazole (PROTONIX) EC tablet 40 mg  40 mg Oral Daily Adele Barthel D, MD   40 mg at 07/21/19 0950  . polyethylene glycol (MIRALAX / GLYCOLAX)  packet 17 g  17 g Oral Daily Adele Barthel D, MD      . pravastatin (PRAVACHOL) tablet 20 mg  20 mg Oral Daily Adele Barthel D, MD   20 mg at 07/21/19 0950  . tiZANidine (ZANAFLEX) tablet 4 mg  4 mg Oral Q8H PRN Blain Pais, MD   4 mg at 07/21/19 0144     Discharge Medications: Please see discharge summary for a list of discharge medications.  Relevant Imaging Results:  Relevant Lab Results:   Additional Information SSN  [id number]  Wende Neighbors, LCSW

## 2019-07-21 NOTE — Progress Notes (Signed)
PT Cancellation Note  Patient Details Name: Erika [name] MRN: [id number] DOB: October 25, 1940   Cancelled Treatment:     Upon arriving to Pt's room, Caryl Pina CM in room in depth conversation with family regarding D/C plans and PT's recommendation for SNF.   Pt requires + 2 heavy assist and was unable to stand or walk on eval.  Will attempt to see another time as schedule permits.     Rica Koyanagi  PTA Acute  Rehabilitation Services Pager      [phone] Office      [phone]

## 2019-07-22 ENCOUNTER — Other Ambulatory Visit: Payer: Self-pay | Admitting: Internal Medicine

## 2019-07-22 DIAGNOSIS — K5903 Drug induced constipation: Secondary | ICD-10-CM

## 2019-07-22 LAB — BASIC METABOLIC PANEL
Anion gap: 10 (ref 5–15)
BUN: 21 mg/dL (ref 8–23)
CO2: 27 mmol/L (ref 22–32)
Calcium: 9.5 mg/dL (ref 8.9–10.3)
Chloride: 98 mmol/L (ref 98–111)
Creatinine, Ser: 1.03 mg/dL — ABNORMAL HIGH (ref 0.44–1.00)
GFR calc Af Amer: >60 mL/min (ref >60)
GFR calc non Af Amer: 53 mL/min — ABNORMAL LOW (ref >60)
Glucose, Bld: 82 mg/dL (ref 70–99)
Potassium: 4.4 mmol/L (ref 3.5–5.1)
Sodium: 135 mmol/L (ref 135–145)

## 2019-07-22 LAB — CBC WITH DIFFERENTIAL/PLATELET
Abs Immature Granulocytes: 4.45 10*3/uL — ABNORMAL HIGH (ref 0.00–0.07)
Basophils Absolute: 0.7 10*3/uL — ABNORMAL HIGH (ref 0.0–0.1)
Basophils Relative: 1 %
Eosinophils Absolute: 0.9 10*3/uL — ABNORMAL HIGH (ref 0.0–0.5)
Eosinophils Relative: 1 %
HCT: 27.9 % — ABNORMAL LOW (ref 36.0–46.0)
Hemoglobin: 8.8 g/dL — ABNORMAL LOW (ref 12.0–15.0)
Immature Granulocytes: 5 %
Lymphocytes Relative: 4 %
Lymphs Abs: 3.2 10*3/uL (ref 0.7–4.0)
MCH: 31.1 pg (ref 26.0–34.0)
MCHC: 31.5 g/dL (ref 30.0–36.0)
MCV: 98.6 fL (ref 80.0–100.0)
Monocytes Absolute: 2.9 10*3/uL — ABNORMAL HIGH (ref 0.1–1.0)
Monocytes Relative: 3 %
Neutro Abs: 77 10*3/uL — ABNORMAL HIGH (ref 1.7–7.7)
Neutrophils Relative %: 86 %
Platelets: 263 10*3/uL (ref 150–400)
RBC: 2.83 MIL/uL — ABNORMAL LOW (ref 3.87–5.11)
RDW: 15.7 % — ABNORMAL HIGH (ref 11.5–15.5)
WBC: 89.1 10*3/uL (ref 4.0–10.5)
nRBC: 0 % (ref 0.0–0.2)

## 2019-07-22 LAB — MULTIPLE MYELOMA PANEL, SERUM
Albumin SerPl Elph-Mcnc: 2.8 g/dL — ABNORMAL LOW (ref 2.9–4.4)
Albumin/Glob SerPl: 0.7 (ref 0.7–1.7)
Alpha 1: 0.4 g/dL (ref 0.0–0.4)
Alpha2 Glob SerPl Elph-Mcnc: 0.8 g/dL (ref 0.4–1.0)
B-Globulin SerPl Elph-Mcnc: 1 g/dL (ref 0.7–1.3)
Gamma Glob SerPl Elph-Mcnc: 2.1 g/dL — ABNORMAL HIGH (ref 0.4–1.8)
Globulin, Total: 4.3 g/dL — ABNORMAL HIGH (ref 2.2–3.9)
IgA: 351 mg/dL (ref 64–422)
IgG (Immunoglobin G), Serum: 2260 mg/dL — ABNORMAL HIGH (ref 586–1602)
IgM (Immunoglobulin M), Srm: 160 mg/dL (ref 26–217)
Total Protein ELP: 7.1 g/dL (ref 6.0–8.5)

## 2019-07-22 LAB — AEROBIC/ANAEROBIC CULTURE W GRAM STAIN (SURGICAL/DEEP WOUND)
Culture: NO GROWTH
Special Requests: NORMAL

## 2019-07-22 LAB — SARS CORONAVIRUS 2 (TAT 6-24 HRS): SARS Coronavirus 2: NEGATIVE

## 2019-07-22 NOTE — Progress Notes (Signed)
Patient requests that if family communication is needed, the person to be contacted is the patient's granddaughter Regino Schultze, phone number [phone].

## 2019-07-22 NOTE — Progress Notes (Signed)
Nutrition Brief Note RD working remotely.   Patient identified for LOS (day #12).  Wt Readings from Last 15 Encounters:  07/13/19 136 kg  03/20/19 130.6 kg  10/08/18 135.6 kg  10/07/18 (!) 136.3 kg  07/04/18 132.9 kg  05/14/18 130.2 kg  03/12/18 129.6 kg  03/06/18 125.6 kg  02/14/18 126.5 kg  12/04/17 124.9 kg  10/31/17 128.5 kg  10/01/17 129 kg  09/18/17 133.9 kg  07/24/17 131.3 kg  06/26/17 134.9 kg    Body mass index is 48.39 kg/m. Patient meets criteria for morbid obesity based on current BMI.   She was admitted with severe back pain with inability to ambulate and is s/p hemilaminectomy of L4-L5 and S1, decompression of tumor and epidural space (found to be granulomatous disease due to TB).   Current diet order is Regular, patient is consuming approximately 100% of meals at this time. Labs and medications reviewed.   No nutrition interventions warranted at this time. If nutrition issues arise, please consult RD.     Jarome Matin, MS, RD, LDN, [facility] Inpatient Clinical Dietitian Pager # [phone] After hours/weekend pager # [phone]

## 2019-07-22 NOTE — Progress Notes (Signed)
Occupational Therapy Treatment Patient Details Name: Erika [name] MRN: [id number] DOB: [birth year] Today's Date: 07/22/2019    History of present illness 78 year old African-American female, morbidly obese with BMI of 48 kg/m, and past medical history significant for myeloproliferative neoplasm NOS-didnt see oncology for 7-53months, hypertension, asthma, paroxysmal atrial fibrillation and status post resection of lumbar spine mass.  Patient also has history of chronic lower back pain, with possible radiculopathy.  Patient presented with worsening right-sided lower back pain and the inability to ambulate over 2 days.   OT comments  Upon arrival pt on bed pan, required minA to roll in bed. Pt required modA to progress legs off edge of bed, but was able to progress her trunk upright into long sitting. Pt required totalA for posterior pericare. She sat edge of bed for 10-85min and required maxA to lateral scoot towards head of bed. Pt requires setup assist for grooming while sitting. Pt will continue to benefit from skilled OT services to maximize safety and independence with ADL/IADL and functional mobility. Will continue to follow acutely and progress as tolerated.    Follow Up Recommendations  SNF    Equipment Recommendations  Other (comment)(TBD next venue)    Recommendations for Other Services      Precautions / Restrictions Precautions Precautions: Fall Restrictions Weight Bearing Restrictions: No       Mobility Bed Mobility Overal bed mobility: Needs Assistance Bed Mobility: Supine to Sit;Sit to Supine;Rolling Rolling: Min assist   Supine to sit: HOB elevated;Mod assist Sit to supine: Mod assist   General bed mobility comments: assist to progress BLE off EOB;pt able to progress trunk to upright position in long sitting with use of bed rails and increased time and effort  Transfers Overall transfer level: Needs assistance Equipment used: Rolling walker (2  wheeled) Transfers: Sit to/from Stand;Lateral/Scoot Transfers Sit to Stand: Total assist        Lateral/Scoot Transfers: Max assist General transfer comment: attempted sit<>stand 1x;maxA to scoot toward head of bead    Balance Overall balance assessment: Needs assistance Sitting-balance support: Bilateral upper extremity supported;Single extremity supported;Feet supported Sitting balance-Leahy Scale: Fair         Standing balance comment: unable to come to standing position this session attempted 1x sit<>stand                           ADL either performed or assessed with clinical judgement   ADL Overall ADL's : Needs assistance/impaired             Lower Body Bathing: Maximal assistance;Total assistance;Bed level;Sitting/lateral leans;+2 for physical assistance;+2 for safety/equipment                         General ADL Comments: pt tolerated sitting EOB for about 10 min;she required maxA for lateral scoots along edge of bed toward head of bed, unable to clear hips with 1x attempt to stand;minA to roll in bed to get off bed pan;     Vision Baseline Vision/History: Wears glasses     Perception     Praxis      Cognition Arousal/Alertness: Awake/alert Behavior During Therapy: WFL for tasks assessed/performed Overall Cognitive Status: Within Functional Limits for tasks assessed  Exercises     Shoulder Instructions       General Comments vss throughout    Pertinent Vitals/ Pain       Pain Assessment: Faces Faces Pain Scale: Hurts little more Pain Location: LLE>RLE Pain Descriptors / Indicators: Guarding;Spasm;Sore Pain Intervention(s): Limited activity within patient's tolerance;Monitored during session;Repositioned  Home Living Family/patient expects to be discharged to:: Private residence Living Arrangements: Children Available Help at Discharge: Family;Available  PRN/intermittently Type of Home: Apartment Home Access: Level entry     Home Layout: One level     Bathroom Shower/Tub: Occupational psychologist: Standard     Home Equipment: Environmental consultant - 4 wheels   Additional Comments: lives with one son, second son comes by to help out, adult grandaughter helps with housecleaning. Both sons work during the day      Prior Functioning/Environment Level of Independence: Independent with assistive device(s)        Comments: rollator. drives herself to doctor appointments. son's do grocery shopping this year due to [place] exposure concerns.   Frequency  Min 2X/week        Progress Toward Goals  OT Goals(current goals can now be found in the care plan section)     Acute Rehab OT Goals Patient Stated Goal: reduce pain, move better, regain independence OT Goal Formulation: With patient Time For Goal Achievement: 08/02/19 Potential to Achieve Goals: Good ADL Goals Pt Will Perform Grooming: with set-up;sitting Pt Will Perform Upper Body Bathing: with set-up;sitting Pt Will Perform Lower Body Bathing: with mod assist;sit to/from stand Pt Will Transfer to Toilet: with mod assist;ambulating;stand pivot transfer;bedside commode Pt Will Perform Toileting - Clothing Manipulation and hygiene: with mod assist;sit to/from stand Additional ADL Goal #1: Pt will complete bed mobility at mod A level to prepare for EOB/OOB ADLs.  Plan Discharge plan remains appropriate    Co-evaluation                 AM-PAC OT "6 Clicks" Daily Activity     Outcome Measure   Help from another person eating meals?: None Help from another person taking care of personal grooming?: A Little Help from another person toileting, which includes using toliet, bedpan, or urinal?: Total Help from another person bathing (including washing, rinsing, drying)?: A Lot Help from another person to put on and taking off regular upper body clothing?: A Lot Help from  another person to put on and taking off regular lower body clothing?: Total 6 Click Score: 13    End of Session Equipment Utilized During Treatment: Gait belt;Rolling walker  OT Visit Diagnosis: Unsteadiness on feet (R26.81);Muscle weakness (generalized) (M62.81);Pain   Activity Tolerance Patient tolerated treatment well   Patient Left in bed;with call bell/phone within reach;with SCD's reapplied;with bed alarm set   Nurse Communication Mobility status        Time: [id number] OT Time Calculation (min): 27 min  Charges: OT General Charges $OT Visit: 1 Visit OT Treatments $Self Care/Home Management : 23-37 mins  [place] Office: [place] 07/22/2019, 4:37 PM

## 2019-07-22 NOTE — Progress Notes (Addendum)
PROGRESS NOTE    Erika [name]  [id number] DOB: 06-09-1941 DOA: 07/10/2019 PCP: [name], MD    Brief Narrative:  78 year old African-American female, morbidly obese with BMI of 48 kg/m, and past medical history significant for myeloproliferative neoplasm NOS-didnt see oncology for 7-74month, hypertension, asthma, paroxysmal atrial fibrillation and status post resection of lumbar spine mass.  Patient also has history of chronic lower back pain, with possible radiculopathy.  Patient presented with worsening right-sided lower back pain and the inability to ambulate over 2 days. -In the emergency room she was noted to have a white count of 107 compared to previous level of 61, creatinine of 1.4, uric acid of 14, ESR of 70, CT pelvis nonspecific, question septic arthritis versus sacroiliitis, seen by orthopedics did not feel this was consistent with septic arthritis -Seen by oncology for profound leukocytosis, concern for tumor lysis syndrome -Underwent bone marrow aspiration biopsy 12/7  Assessment & Plan:   Principal Problem:   Myeloproliferative disorder ([facility] Active Problems:   Obesity, Class III, BMI 40-49.9 (morbid obesity) ([facility]   Essential hypertension   Moderate persistent asthma   AF (paroxysmal atrial fibrillation) ([facility])   AKI (acute kidney injury) ([place]   Thrombocytopenia ([facility])   Hyperuricemia   Tumor lysis syndrome    Severe back pain/inability to ambulate/possible left-sided sacroiliitis versus septic left sacral iliac joint: -Patient has acute on chronic left hip and lower back pain along with radiculopathy -She was treated for paraspinal/lumbar TB in 2002, and also underwent hemilaminectomy of L4-L5 and S1, decompression of tumor and epidural space, which was later noted to be granulomatous disease from TB -Not have constitutional symptoms, fever chills and she has a chronically elevated ESR which could be explained by her progressive myelopathy proliferative  neoplasm.  -Seen by Orthopedics in consultation, infection felt to be less likely -However, given her worsening symptoms, limitations in ambulation and complex history of TB in her LS spine agree with MRI spine and left hip with and without contrast. [facility]  -MRI of spine negative for evidence of infectious process but left SI joint could not have septic arthritis ruled out.  -Patient underwent SI joint aspiration on 12/11, fluid Gram stain with no organism. Culture is NGTD x3 days. Will continue monitoring off Abx.  -She reports feeling better and being able to move her left leg after starting tizanidine which will be continued.    Continue:  -Pain control -Patient reports no pain in her left hip now with no indication for left hip injection.  -Will continue tizanidine as needed as muscle relaxer.  -Continue heating pad, tylenol as needed.  -Continue PT and OT. Planning SNF placement.  CSW consulted and actively working on placement.  -COVID testing for SNF in progress  Severe leukocytosis, myeloproliferative Neoplasm NOS: -WBC 107 on presentation,  -Oncology Dr. KIrene Limbo following -She is s/p bone marrow biopsy, results per Hem/Onc, with no transformation/leukemia. Hydroxyurea held.   Elevated uric acid level/tumor lysis syndrome -Uric acid was 14.1 on presentation. Down to 3.4 now.  -Calcium, potassium within normal range, kidney function improving  -Continue daily allopurinol, even after discharge  Paroxysmal atrial fibrillation: Heart rate is controlled now but was slightly elevated earlier today Continue metoprolol 12.5 mg p.o. twice daily.  Apixaban 5 mg p.o. twice daily.  Continue telemetry    Morbid obesity: -Needs aggressive lifestyle modification  Possible lower lobe infiltrates as per chest  x-ray on presentation: -Repeat CXR with no infiltrate and patient with no symptoms to indicate pneumonia.    -Continue monitoring off antibiotics.   Acute Kidney injury: -Improved with hydration, creatinine down to 0.9   DVT prophylaxis: Eliquis Code Status: Full Family Communication: Pt in room, family not at bedside Disposition Plan: SNF, pending COVID test  Consultants:   Heme/Onc  Ortho  IR  Procedures:  Bone Marrow biopsy scheduled for 07/13/19 Antimicrobials:   IV vancomycin 12/5-12/6  IV ceftriaxone  07/11/2019-07/12/2019.  IV cefepime discontinued on 12/6  Antimicrobials: Anti-infectives (From admission, onward)   Start     Dose/Rate Route Frequency Ordered Stop   07/12/19 1000  vancomycin (VANCOCIN) 1,500 mg in sodium chloride 0.9 % 500 mL IVPB  Status:  Discontinued     1,500 mg 250 mL/hr over 120 Minutes Intravenous Every 36 hours 07/11/19 0617 07/12/19 1537   07/11/19 1800  ceFEPIme (MAXIPIME) 2 g in sodium chloride 0.9 % 100 mL IVPB  Status:  Discontinued     2 g 200 mL/hr over 30 Minutes Intravenous Every 12 hours 07/11/19 0615 07/11/19 1445   07/11/19 1600  cefTRIAXone (ROCEPHIN) 2 g in sodium chloride 0.9 % 100 mL IVPB  Status:  Discontinued     2 g 200 mL/hr over 30 Minutes Intravenous Every 24 hours 07/11/19 1445 07/12/19 1537   07/10/19 2245  vancomycin (VANCOCIN) 2,000 mg in sodium chloride 0.9 % 500 mL IVPB     2,000 mg 250 mL/hr over 120 Minutes Intravenous  Once 07/10/19 2234 07/11/19 0234   07/10/19 2245  ceFEPIme (MAXIPIME) 2 g in sodium chloride 0.9 % 100 mL IVPB  Status:  Discontinued     2 g 200 mL/hr over 30 Minutes Intravenous Every 8 hours 07/10/19 2234 07/11/19 0615       Subjective: Eager to go to SNF  Objective: Vitals:   07/22/19 0631 07/22/19 0643 07/22/19 0758 07/22/19 1345  BP: 127/89   129/65  Pulse: (!) 112 (!) 104  96  Resp:    16  Temp: 98 F (36.7 C)   98 F (36.7 C)  TempSrc: Oral   Oral  SpO2: 94%  97% 95%  Weight:      Height:        Intake/Output Summary (Last 24 hours) at 07/22/2019 1639 Last data filed  at 07/22/2019 1100 Gross per 24 hour  Intake 1020 ml  Output 900 ml  Net 120 ml   Filed Weights   07/10/19 1639 07/11/19 1119 07/13/19 1058  Weight: 134.7 kg 135.1 kg 136 kg    Examination:  General exam: Appears calm and comfortable  Respiratory system: Clear to auscultation. Respiratory effort normal.  Data Reviewed: I have personally reviewed following labs and imaging studies  CBC: Recent Labs  Lab 07/17/19 0758 07/19/19 0523 07/20/19 0545 07/22/19 0536  WBC 101.7* 95.2* 96.5* 89.1*  NEUTROABS 89.3*  --  84.8* 77.0*  HGB 8.7* 8.5* 9.2* 8.8*  HCT 26.8* 27.7* 29.6* 27.9*  MCV 97.1 99.3 98.3 98.6  PLT 168 223 240 376   Basic Metabolic Panel: Recent Labs  Lab 07/16/19 0548 07/17/19 0514 07/19/19 0523 07/20/19 0545 07/22/19 0536  NA 132* 132* 137 134* 135  K 4.0 4.1 4.7 4.3 4.4  CL 97* 96* 98 98 98  CO2 _0 GLUCOSE 95 98 29* 78 82  BUN _1 CREATININE 0.95 0.86 1.01* 0.86 1.03*  CALCIUM 9.0 9.0 9.6 9.5 9.5  GFR: Estimated Creatinine Clearance: 66 mL/min (A) (by C-G formula based on SCr of 1.03 mg/dL (H)). Liver Function Tests: Recent Labs  Lab 07/16/19 0548 07/17/19 0514  AST 15 16  ALT 11 10  ALKPHOS 238* 237*  BILITOT 0.7 0.7  PROT 7.5 7.4  ALBUMIN 2.8* 2.9*   No results for input(s): LIPASE, AMYLASE in the last 168 hours. No results for input(s): AMMONIA in the last 168 hours. Coagulation Profile: No results for input(s): INR, PROTIME in the last 168 hours. Cardiac Enzymes: Recent Labs  Lab 07/17/19 0514  CKTOTAL 74   BNP (last 3 results) No results for input(s): PROBNP in the last 8760 hours. HbA1C: No results for input(s): HGBA1C in the last 72 hours. CBG: Recent Labs  Lab 07/19/19 0725  GLUCAP 97   Lipid Profile: No results for input(s): CHOL, HDL, LDLCALC, TRIG, CHOLHDL, LDLDIRECT in the last 72 hours. Thyroid Function Tests: No results for input(s): TSH, T4TOTAL, FREET4, T3FREE, THYROIDAB in the last  72 hours. Anemia Panel: No results for input(s): VITAMINB12, FOLATE, FERRITIN, TIBC, IRON, RETICCTPCT in the last 72 hours. Sepsis Labs: No results for input(s): PROCALCITON, LATICACIDVEN in the last 168 hours.  Recent Results (from the past 240 hour(s))  Aerobic/Anaerobic Culture (surgical/deep wound)     Status: None   Collection Time: 07/17/19 10:35 AM   Specimen: Wound; Synovial Fluid  Result Value Ref Range Status   Specimen Description   Final    WOUND LT SACROILIAC ASPIRATION Performed at [place] [address], [place], [place] [address]    Special Requests   Final    Normal Performed at [facility], [place] [address] [place], [place] [address]    Gram Stain   Final    RARE WBC PRESENT, PREDOMINANTLY PMN NO ORGANISMS SEEN    Culture   Final    No growth aerobically or anaerobically. Performed at [facility], [place] [address] [place], [place] [address]    Report Status 07/22/2019 FINAL  Final     Radiology Studies: No results found.  Scheduled Meds: . allopurinol  150 mg Oral BID  . amLODipine  10 mg Oral Daily  . apixaban  5 mg Oral BID  . metoprolol tartrate  25 mg Oral BID  . mometasone-formoterol  2 puff Inhalation BID  . pantoprazole  40 mg Oral Daily  . polyethylene glycol  17 g Oral Daily  . pravastatin  20 mg Oral Daily   Continuous Infusions:   LOS: 12 days   Marylu Lund, MD [facility] Pager On Amion  If 7PM-7AM, please contact night-coverage 07/22/2019, 4:39 PM

## 2019-07-22 NOTE — TOC Progression Note (Signed)
Transition of Care [facility]) - Progression Note    Patient Details  Name: Erika [name] MRN: [id number] Date of Birth: January 26, 1941  [facility] CM/SW Contact  Ryann Leavitt, Juliann Pulse, RN Phone Number: 07/22/2019, 1:21 PM  Clinical Narrative:  Faxed to Apolonio Schneiders for SNF-[facility], & [facility] for transportation-await outcome.     Expected Discharge Plan: Skilled Nursing Facility Barriers to Discharge: Ship broker  Expected Discharge Plan and Services Expected Discharge Plan: [place] In-house Referral: Clinical Social Work Discharge Planning Services: CM Consult Post Acute Care Choice: [place] Living arrangements for the past 2 months: Apartment                                       Social Determinants of Health (SDOH) Interventions    Readmission Risk Interventions No flowsheet data found.

## 2019-07-23 ENCOUNTER — Inpatient Hospital Stay (HOSPITAL_COMMUNITY): Payer: PPO

## 2019-07-23 LAB — CBC
HCT: 31.9 % — ABNORMAL LOW (ref 36.0–46.0)
Hemoglobin: 10.1 g/dL — ABNORMAL LOW (ref 12.0–15.0)
MCH: 30.7 pg (ref 26.0–34.0)
MCHC: 31.7 g/dL (ref 30.0–36.0)
MCV: 97 fL (ref 80.0–100.0)
Platelets: 341 10*3/uL (ref 150–400)
RBC: 3.29 MIL/uL — ABNORMAL LOW (ref 3.87–5.11)
RDW: 15.9 % — ABNORMAL HIGH (ref 11.5–15.5)
WBC: 89.4 10*3/uL (ref 4.0–10.5)
nRBC: 0 % (ref 0.0–0.2)

## 2019-07-23 LAB — URINALYSIS, ROUTINE W REFLEX MICROSCOPIC
Bacteria, UA: NONE SEEN
Bilirubin Urine: NEGATIVE
Glucose, UA: NEGATIVE mg/dL
Ketones, ur: NEGATIVE mg/dL
Leukocytes,Ua: NEGATIVE
Nitrite: NEGATIVE
Protein, ur: 30 mg/dL — AB
Specific Gravity, Urine: 1.015 (ref 1.005–1.030)
pH: 7 (ref 5.0–8.0)

## 2019-07-23 LAB — COMPREHENSIVE METABOLIC PANEL
ALT: 14 U/L (ref 0–44)
AST: 18 U/L (ref 15–41)
Albumin: 3.2 g/dL — ABNORMAL LOW (ref 3.5–5.0)
Alkaline Phosphatase: 288 U/L — ABNORMAL HIGH (ref 38–126)
Anion gap: 12 (ref 5–15)
BUN: 17 mg/dL (ref 8–23)
CO2: 26 mmol/L (ref 22–32)
Calcium: 9.8 mg/dL (ref 8.9–10.3)
Chloride: 97 mmol/L — ABNORMAL LOW (ref 98–111)
Creatinine, Ser: 1.01 mg/dL — ABNORMAL HIGH (ref 0.44–1.00)
GFR calc Af Amer: >60 mL/min (ref >60)
GFR calc non Af Amer: 54 mL/min — ABNORMAL LOW (ref >60)
Glucose, Bld: 100 mg/dL — ABNORMAL HIGH (ref 70–99)
Potassium: 4 mmol/L (ref 3.5–5.1)
Sodium: 135 mmol/L (ref 135–145)
Total Bilirubin: 0.5 mg/dL (ref 0.3–1.2)
Total Protein: 8.1 g/dL (ref 6.5–8.1)

## 2019-07-23 LAB — GLUCOSE, CAPILLARY: Glucose-Capillary: 88 mg/dL (ref 70–99)

## 2019-07-23 LAB — AMMONIA: Ammonia: 17 umol/L (ref 9–35)

## 2019-07-23 MED ORDER — LORAZEPAM 2 MG/ML IJ SOLN
0.5000 mg | Freq: Four times a day (QID) | INTRAMUSCULAR | Status: DC | PRN
  Administered 2019-07-23: 0.5 mg via INTRAVENOUS
  Filled 2019-07-23: qty 1

## 2019-07-23 MED ORDER — METOPROLOL TARTRATE 50 MG PO TABS
50.0000 mg | ORAL_TABLET | Freq: Two times a day (BID) | ORAL | Status: DC
  Administered 2019-07-23 – 2019-07-28 (×10): 50 mg via ORAL
  Filled 2019-07-23 (×10): qty 1

## 2019-07-23 NOTE — Progress Notes (Signed)
Physical Therapy Treatment Patient Details Name: Erika [name] MRN: [id number] DOB: [birth year] Today's Date: 07/23/2019    History of Present Illness 78 year old African-American female, morbidly obese with BMI of 48 kg/m, and past medical history significant for myeloproliferative neoplasm NOS-didnt see oncology for 7-60months, hypertension, asthma, paroxysmal atrial fibrillation and status post resection of lumbar spine mass.  Patient also has history of chronic lower back pain, with possible radiculopathy.  Patient presented with worsening right-sided lower back pain and the inability to ambulate over 2 days.    PT Comments    Pt with increased confusion and decreased attention to task at hand today that limited therapy.  Pt was continually singing and waving arms in "praising motion."  She had difficulty focusing on commands and limited ability to participate with therapy.  Did assist some with transfers and exercises with max cues to stay on task.  Continued to require  Mod-max A of 2 for transfer to EOB and unable to stand.    Follow Up Recommendations  SNF     Equipment Recommendations  None recommended by PT    Recommendations for Other Services       Precautions / Restrictions Precautions Precautions: Fall    Mobility  Bed Mobility Overal bed mobility: Needs Assistance Bed Mobility: Supine to Sit;Sit to Supine;Rolling Rolling: Mod assist;+2 for physical assistance   Supine to sit: HOB elevated;Mod assist;+2 for physical assistance Sit to supine: Mod assist;HOB elevated;+2 for physical assistance   General bed mobility comments: pt with limited initiation, but did elevate trunk with mod A once feet were off bed; increased time  Transfers Overall transfer level: Needs assistance Equipment used: Rolling walker (2 wheeled)             General transfer comment: attempted sit to stand x 3 with max A x 2 and bed elevated but pt unable to initiate or assist; unable to  scoot at EOB  Ambulation/Gait                 Stairs             Wheelchair Mobility    Modified Rankin (Stroke Patients Only)       Balance Overall balance assessment: Needs assistance Sitting-balance support: Feet supported;No upper extremity supported Sitting balance-Leahy Scale: Fair Sitting balance - Comments: EOB for 10 minutes with SBA       Standing balance comment: unable                            Cognition Arousal/Alertness: Awake/alert Behavior During Therapy: (continually singing) Overall Cognitive Status: Impaired/Different from baseline Area of Impairment: Orientation;Attention;Following commands;Safety/judgement;Awareness;Problem solving                 Orientation Level: Disoriented to;Person;Place;Time;Situation     Following Commands: Follows one step commands inconsistently Safety/Judgement: Decreased awareness of safety;Decreased awareness of deficits   Problem Solving: Slow processing;Decreased initiation;Difficulty sequencing;Requires tactile cues;Requires verbal cues General Comments: Pt continually singing "Thank you Jesus, all for you Lord" and waiving hands or looking at clock.  Pt was singing to the point of shortness of breath at times.  Able to get pt's attention for short period of time by saying her name, but she still followed minimal commands at that point.      Exercises General Exercises - Lower Extremity Ankle Circles/Pumps: AROM;10 reps;Both;Seated Long Arc Quad: AROM;10 reps;Seated    General Comments General comments (skin integrity, edema, etc.):  vss: Pt had some dyspnea from singing but vss      Pertinent Vitals/Pain Pain Assessment: No/denies pain    Home Living                      Prior Function            PT Goals (current goals can now be found in the care plan section) Progress towards PT goals: Not progressing toward goals - comment(increased confusion)     Frequency    Min 3X/week      PT Plan Current plan remains appropriate    Co-evaluation              AM-PAC PT "6 Clicks" Mobility   Outcome Measure  Help needed turning from your back to your side while in a flat bed without using bedrails?: Total Help needed moving from lying on your back to sitting on the side of a flat bed without using bedrails?: Total Help needed moving to and from a bed to a chair (including a wheelchair)?: Total Help needed standing up from a chair using your arms (e.g., wheelchair or bedside chair)?: Total Help needed to walk in hospital room?: Total Help needed climbing 3-5 steps with a railing? : Total 6 Click Score: 6    End of Session Equipment Utilized During Treatment: Gait belt Activity Tolerance: Other (comment)(limited by confusion/attention) Patient left: in bed;with call bell/phone within reach;with bed alarm set Nurse Communication: Mobility status PT Visit Diagnosis: Muscle weakness (generalized) (M62.81);Difficulty in walking, not elsewhere classified (R26.2);Pain     Time: [id number] PT Time Calculation (min) (ACUTE ONLY): 19 min  Charges:  $Therapeutic Activity: 8-22 mins                     Maggie Font, PT Acute Rehab Services Pager [phone] [facility] Rehab [phone] [facility] [place] 07/23/2019, 4:42 PM

## 2019-07-23 NOTE — Progress Notes (Signed)
In and Out cath completed with 600 ml of yellow urine returned.  Will continue to monitor.

## 2019-07-23 NOTE — Progress Notes (Signed)
PROGRESS NOTE    Erika [name]  [id number] DOB: 09-Jun-1941 DOA: 07/10/2019 PCP: [name], MD    Brief Narrative:  78 year old African-American female, morbidly obese with BMI of 48 kg/m, and past medical history significant for myeloproliferative neoplasm NOS-didnt see oncology for 7-36month, hypertension, asthma, paroxysmal atrial fibrillation and status post resection of lumbar spine mass.  Patient also has history of chronic lower back pain, with possible radiculopathy.  Patient presented with worsening right-sided lower back pain and the inability to ambulate over 2 days. -In the emergency room she was noted to have a white count of 107 compared to previous level of 61, creatinine of 1.4, uric acid of 14, ESR of 70, CT pelvis nonspecific, question septic arthritis versus sacroiliitis, seen by orthopedics did not feel this was consistent with septic arthritis -Seen by oncology for profound leukocytosis, concern for tumor lysis syndrome -Underwent bone marrow aspiration biopsy 12/7  Assessment & Plan:   Principal Problem:   Myeloproliferative disorder ([facility] Active Problems:   Obesity, Class III, BMI 40-49.9 (morbid obesity) ([facility]   Essential hypertension   Moderate persistent asthma   AF (paroxysmal atrial fibrillation) (HCC)   AKI (acute kidney injury) ([facility]   Thrombocytopenia (HCC)   Hyperuricemia   Tumor lysis syndrome    Severe back pain/inability to ambulate/possible left-sided sacroiliitis versus septic left sacral iliac joint: -Patient has acute on chronic left hip and lower back pain along with radiculopathy -She was treated for paraspinal/lumbar TB in 2002, and also underwent hemilaminectomy of L4-L5 and S1, decompression of tumor and epidural space, which was later noted to be granulomatous disease from TB -Not have constitutional symptoms, fever chills and she has a chronically elevated ESR which could be explained by her progressive myelopathy proliferative  neoplasm.  -Seen by Orthopedics in consultation, infection felt to be less likely -However, given her worsening symptoms, limitations in ambulation and complex history of TB in her LS spine agree with MRI spine and left hip with and without contrast. [facility]  -MRI of spine negative for evidence of infectious process but left SI joint could not have septic arthritis ruled out.  -Patient underwent SI joint aspiration on 12/11, fluid Gram stain with no organism. Culture is NGTD x3 days. Will continue monitoring off Abx.  -She reports feeling better and being able to move her left leg after starting tizanidine which will be continued.    Continue:  -Pain control -Patient reports no pain in her left hip now with no indication for left hip injection.  -Will continue tizanidine as needed as muscle relaxer.  -Continue heating pad, tylenol as needed.  -Continue PT and OT. Planning SNF placement.  CSW consulted and following -COVID test from 12/16 neg  Severe leukocytosis, myeloproliferative Neoplasm NOS: -WBC 107 on presentation,  -Oncology Dr. KIrene Limbo following -She is s/p bone marrow biopsy, results per Hem/Onc, with no transformation/leukemia. Hydroxyurea held.  -WBC now trending down  Elevated uric acid level/tumor lysis syndrome -Uric acid was 14.1 on presentation. Down to 3.4 now.  -Calcium, potassium within normal range, kidney function stable -Continued on daily allopurinol, even after discharge  Paroxysmal atrial fibrillation: -Heart rate is controlled now but was slightly elevated earlier today -Continue metoprolol 12.5 mg p.o. twice daily.  -Continue with Apixaban 5 mg p.o. twice daily.   Morbid obesity: -Needs aggressive lifestyle modification  Possible lower lobe infiltrates as per chest x-ray on presentation: -  Repeat CXR with no infiltrate and patient with no symptoms to indicate pneumonia.  -Continue  monitoring off antibiotics.   Acute Kidney injury: -Improved with hydration -Repeat BMET  Acute toxic metabolic encephalopathy -New finding this admit -Patient seen this AM yelling and screaming -Will order STAT CMP and CBC -As pt is on eliquis, will check STAT head CT to r/o intracranial hemorrhage or other acute process  Bladder outlet obstruction -New finding today -Noted to have over 400cc on bladder scan. Will I/o cath -Repeat bladder scan in several hrs and if retains >300cc, then consider indwelling cath  DVT prophylaxis: Eliquis Code Status: Full Family Communication: Pt in room, family not at bedside Disposition Plan: SNF, when stable  Consultants:   Heme/Onc  Ortho  IR  Procedures:  Bone Marrow biopsy scheduled for 07/13/19 Antimicrobials:   IV vancomycin 12/5-12/6  IV ceftriaxone  07/11/2019-07/12/2019.  IV cefepime discontinued on 12/6  Antimicrobials: Anti-infectives (From admission, onward)   Start     Dose/Rate Route Frequency Ordered Stop   07/12/19 1000  vancomycin (VANCOCIN) 1,500 mg in sodium chloride 0.9 % 500 mL IVPB  Status:  Discontinued     1,500 mg 250 mL/hr over 120 Minutes Intravenous Every 36 hours 07/11/19 0617 07/12/19 1537   07/11/19 1800  ceFEPIme (MAXIPIME) 2 g in sodium chloride 0.9 % 100 mL IVPB  Status:  Discontinued     2 g 200 mL/hr over 30 Minutes Intravenous Every 12 hours 07/11/19 0615 07/11/19 1445   07/11/19 1600  cefTRIAXone (ROCEPHIN) 2 g in sodium chloride 0.9 % 100 mL IVPB  Status:  Discontinued     2 g 200 mL/hr over 30 Minutes Intravenous Every 24 hours 07/11/19 1445 07/12/19 1537   07/10/19 2245  vancomycin (VANCOCIN) 2,000 mg in sodium chloride 0.9 % 500 mL IVPB     2,000 mg 250 mL/hr over 120 Minutes Intravenous  Once 07/10/19 2234 07/11/19 0234   07/10/19 2245  ceFEPIme (MAXIPIME) 2 g in sodium chloride 0.9 % 100 mL IVPB  Status:  Discontinued     2 g 200 mL/hr over 30 Minutes Intravenous Every 8 hours  07/10/19 2234 07/11/19 0615      Subjective: Confused, yelling about God  Objective: Vitals:   07/23/19 0619 07/23/19 0822 07/23/19 1055 07/23/19 1227  BP: (!) 158/77   134/77  Pulse: (!) 103  96 (!) 102  Resp: 20   20  Temp: 98.2 F (36.8 C)   97.9 F (36.6 C)  TempSrc: Oral     SpO2: 90% 93%  96%  Weight:      Height:        Intake/Output Summary (Last 24 hours) at 07/23/2019 1638 Last data filed at 07/23/2019 9169 Gross per 24 hour  Intake 240 ml  Output 1700 ml  Net -1460 ml   Filed Weights   07/10/19 1639 07/11/19 1119 07/13/19 1058  Weight: 134.7 kg 135.1 kg 136 kg    Examination: General exam: Awake, laying in bed, in nad Respiratory system: Normal respiratory effort, no wheezing Cardiovascular system: regular rate, s1, s2 Gastrointestinal system: Soft, nondistended, positive BS Central nervous system: CN2-12 grossly intact, strength intact Extremities: Perfused, no clubbing Skin: Normal skin turgor, no notable skin lesions seen Psychiatry: Yelling about god and pointing up, confused   Data Reviewed: I have personally reviewed following labs and imaging studies  CBC: Recent Labs  Lab 07/17/19 0758 07/19/19 0523 07/20/19 0545 07/22/19 0536  WBC 101.7* 95.2* 96.5* 89.1*  NEUTROABS 89.3*  --  84.8* 77.0*  HGB 8.7* 8.5* 9.2* 8.8*  HCT 26.8* 27.7* 29.6* 27.9*  MCV 97.1 99.3 98.3 98.6  PLT 168 223 240 165   Basic Metabolic Panel: Recent Labs  Lab 07/17/19 0514 07/19/19 0523 07/20/19 0545 07/22/19 0536  NA 132* 137 134* 135  K 4.1 4.7 4.3 4.4  CL 96* 98 98 98  CO2 _0 GLUCOSE 98 29* 78 82  BUN _1 CREATININE 0.86 1.01* 0.86 1.03*  CALCIUM 9.0 9.6 9.5 9.5   GFR: Estimated Creatinine Clearance: 66 mL/min (A) (by C-G formula based on SCr of 1.03 mg/dL (H)). Liver Function Tests: Recent Labs  Lab 07/17/19 0514  AST 16  ALT 10  ALKPHOS 237*  BILITOT 0.7  PROT 7.4  ALBUMIN 2.9*   No results for input(s): LIPASE,  AMYLASE in the last 168 hours. No results for input(s): AMMONIA in the last 168 hours. Coagulation Profile: No results for input(s): INR, PROTIME in the last 168 hours. Cardiac Enzymes: Recent Labs  Lab 07/17/19 0514  CKTOTAL 74   BNP (last 3 results) No results for input(s): PROBNP in the last 8760 hours. HbA1C: No results for input(s): HGBA1C in the last 72 hours. CBG: Recent Labs  Lab 07/19/19 0725  GLUCAP 97   Lipid Profile: No results for input(s): CHOL, HDL, LDLCALC, TRIG, CHOLHDL, LDLDIRECT in the last 72 hours. Thyroid Function Tests: No results for input(s): TSH, T4TOTAL, FREET4, T3FREE, THYROIDAB in the last 72 hours. Anemia Panel: No results for input(s): VITAMINB12, FOLATE, FERRITIN, TIBC, IRON, RETICCTPCT in the last 72 hours. Sepsis Labs: No results for input(s): PROCALCITON, LATICACIDVEN in the last 168 hours.  Recent Results (from the past 240 hour(s))  Aerobic/Anaerobic Culture (surgical/deep wound)     Status: None   Collection Time: 07/17/19 10:35 AM   Specimen: Wound; Synovial Fluid  Result Value Ref Range Status   Specimen Description   Final    WOUND LT SACROILIAC ASPIRATION Performed at [facility] [address], [place], [place] [address]    Special Requests   Final    Normal Performed at [facility], [place] [address] [place], [place] [address]    Gram Stain   Final    RARE WBC PRESENT, PREDOMINANTLY PMN NO ORGANISMS SEEN    Culture   Final    No growth aerobically or anaerobically. Performed at [facility], [place] [address] [place], [place] [address]    Report Status 07/22/2019 FINAL  Final  SARS CORONAVIRUS 2 (TAT 6-24 HRS) Nasopharyngeal Nasopharyngeal Swab     Status: None   Collection Time: 07/22/19  2:52 PM   Specimen: Nasopharyngeal Swab  Result Value Ref Range Status   SARS Coronavirus 2 NEGATIVE NEGATIVE Final    Comment: (NOTE) SARS-CoV-2 target nucleic acids are NOT  DETECTED. The SARS-CoV-2 RNA is generally detectable in upper and lower respiratory specimens during the acute phase of infection. Negative results do not preclude SARS-CoV-2 infection, do not rule out co-infections with other pathogens, and should not be used as the sole basis for treatment or other patient management decisions. Negative results must be combined with clinical observations, patient history, and epidemiological information. The expected result is Negative. Fact Sheet for Patients: SugarRoll.be Fact Sheet for Healthcare Providers: https://www.woods-mathews.com/ This test is not yet approved or cleared by the [place] FDA and  has been authorized for detection and/or diagnosis of SARS-CoV-2 by FDA under an Emergency Use Authorization (EUA).  This EUA will remain  in effect (meaning this test can be used) for the duration of the COVID-19 declaration under Section 56 4(b)(1) of the Act, 21 U.S.C. section 360bbb-3(b)(1), unless the authorization is terminated or revoked sooner. Performed at [facility], [place] [address], [place], [place] [address]      Radiology Studies: No results found.  Scheduled Meds: . allopurinol  150 mg Oral BID  . amLODipine  10 mg Oral Daily  . apixaban  5 mg Oral BID  . metoprolol tartrate  50 mg Oral BID  . mometasone-formoterol  2 puff Inhalation BID  . pantoprazole  40 mg Oral Daily  . polyethylene glycol  17 g Oral Daily  . pravastatin  20 mg Oral Daily   Continuous Infusions:   LOS: 13 days   Marylu Lund, MD [facility] Pager On Amion  If 7PM-7AM, please contact night-coverage 07/23/2019, 4:38 PM

## 2019-07-23 NOTE — TOC Progression Note (Signed)
Transition of Care [facility]) - Progression Note    Patient Details  Name: Erika [name] MRN: [id number] Date of Birth: 1940/08/15  Transition of [facility]) CM/SW Contact  Sion Thane, Juliann Pulse, RN Phone Number: 07/23/2019, 11:58 AM  Clinical Narrative: Aida Puffer for SNF-12/17-12/23.[id number].awaiting auth for transportation by [facility] from [employer]. covid neg 12/16.Patient has a $200 co pay for 10days-wil contact grand dtr listed as primary contact Kalisa to confirm if able to pay.      Expected Discharge Plan: Skilled Nursing Facility Barriers to Discharge: Ship broker  Expected Discharge Plan and Services Expected Discharge Plan: [place] In-house Referral: Clinical Social Work Discharge Planning Services: CM Consult Post Acute Care Choice: [place] Living arrangements for the past 2 months: Apartment                                       Social Determinants of Health (SDOH) Interventions    Readmission Risk Interventions No flowsheet data found.

## 2019-07-23 NOTE — TOC Progression Note (Signed)
Transition of Care [facility]) - Progression Note    Patient Details  Name: Erika [name] MRN: [id number] Date of Birth: June 19, 1941  Transition of Care [facility]) CM/SW Contact  Jermon Chalfant, Juliann Pulse, RN Phone Number: 07/23/2019, 9:47 AM  Clinical Narrative:  Per [facility] for insurance auth- rep Crystal-med review in process-await outcome. Patient chose [facility]-they made a bed offer. Covid neg 12/16.    Expected Discharge Plan: Skilled Nursing Facility Barriers to Discharge: Ship broker  Expected Discharge Plan and Services Expected Discharge Plan: [place] In-house Referral: Clinical Social Work Discharge Planning Services: CM Consult Post Acute Care Choice: [place] Living arrangements for the past 2 months: Apartment                                       Social Determinants of Health (SDOH) Interventions    Readmission Risk Interventions No flowsheet data found.

## 2019-07-23 NOTE — Plan of Care (Signed)
  Problem: Health Behavior/Discharge Planning: Goal: Ability to manage health-related needs will improve Outcome: Progressing   Problem: Clinical Measurements: Goal: Ability to maintain clinical measurements within normal limits will improve Outcome: Progressing Goal: Will remain free from infection Outcome: Progressing Goal: Diagnostic test results will improve Outcome: Progressing Goal: Respiratory complications will improve Outcome: Progressing Goal: Cardiovascular complication will be avoided Outcome: Progressing   Problem: Elimination: Goal: Will not experience complications related to bowel motility Outcome: Progressing Goal: Will not experience complications related to urinary retention Outcome: Progressing   Problem: Pain Managment: Goal: General experience of comfort will improve Outcome: Progressing   Problem: Safety: Goal: Ability to remain free from injury will improve Outcome: Progressing   Problem: Skin Integrity: Goal: Risk for impaired skin integrity will decrease Outcome: Progressing

## 2019-07-24 ENCOUNTER — Inpatient Hospital Stay: Payer: PPO | Admitting: Hematology

## 2019-07-24 DIAGNOSIS — M5489 Other dorsalgia: Secondary | ICD-10-CM

## 2019-07-24 DIAGNOSIS — R451 Restlessness and agitation: Secondary | ICD-10-CM | POA: Diagnosis present

## 2019-07-24 DIAGNOSIS — G8929 Other chronic pain: Secondary | ICD-10-CM

## 2019-07-24 DIAGNOSIS — G4709 Other insomnia: Secondary | ICD-10-CM

## 2019-07-24 LAB — COMPREHENSIVE METABOLIC PANEL
ALT: 14 U/L (ref 0–44)
AST: 16 U/L (ref 15–41)
Albumin: 3.3 g/dL — ABNORMAL LOW (ref 3.5–5.0)
Alkaline Phosphatase: 267 U/L — ABNORMAL HIGH (ref 38–126)
Anion gap: 11 (ref 5–15)
BUN: 18 mg/dL (ref 8–23)
CO2: 27 mmol/L (ref 22–32)
Calcium: 9.6 mg/dL (ref 8.9–10.3)
Chloride: 99 mmol/L (ref 98–111)
Creatinine, Ser: 0.87 mg/dL (ref 0.44–1.00)
GFR calc Af Amer: >60 mL/min (ref >60)
GFR calc non Af Amer: >60 mL/min (ref >60)
Glucose, Bld: 84 mg/dL (ref 70–99)
Potassium: 3.8 mmol/L (ref 3.5–5.1)
Sodium: 137 mmol/L (ref 135–145)
Total Bilirubin: 0.5 mg/dL (ref 0.3–1.2)
Total Protein: 8 g/dL (ref 6.5–8.1)

## 2019-07-24 LAB — URINE CULTURE: Culture: NO GROWTH

## 2019-07-24 LAB — GLUCOSE, CAPILLARY: Glucose-Capillary: 81 mg/dL (ref 70–99)

## 2019-07-24 MED ORDER — LORAZEPAM 2 MG/ML IJ SOLN
0.5000 mg | Freq: Once | INTRAMUSCULAR | Status: AC
  Administered 2019-07-24: 04:00:00 0.5 mg via INTRAVENOUS
  Filled 2019-07-24: qty 1

## 2019-07-24 MED ORDER — QUETIAPINE FUMARATE 25 MG PO TABS
25.0000 mg | ORAL_TABLET | Freq: Every day | ORAL | Status: DC
  Administered 2019-07-24 – 2019-07-27 (×4): 25 mg via ORAL
  Filled 2019-07-24 (×4): qty 1

## 2019-07-24 MED ORDER — QUETIAPINE FUMARATE 25 MG PO TABS: 12.5000 mg | ORAL_TABLET | Freq: Two times a day (BID) | ORAL | Status: DC | PRN

## 2019-07-24 MED ORDER — NYSTATIN 100000 UNIT/GM EX POWD
Freq: Two times a day (BID) | CUTANEOUS | Status: DC
  Filled 2019-07-24: qty 15

## 2019-07-24 MED ORDER — LORAZEPAM 2 MG/ML IJ SOLN
0.5000 mg | Freq: Four times a day (QID) | INTRAMUSCULAR | Status: DC | PRN
  Administered 2019-07-24 (×2): 0.5 mg via INTRAMUSCULAR
  Filled 2019-07-24 (×2): qty 1

## 2019-07-24 NOTE — Progress Notes (Signed)
Paged MD this AM with bladder scan results of 476. Patient had 2 I&O cath over the past shift.   Presented to patient's bedside at 0845 to place foley per MD order. Patient noticied to be incontinent at this time.   Bladder scan repeated at 0900 with results of 9mL.  Catheter not placed secondary to patient void.    Report passed to [facility]. Will continue to monitor patient.  . . . . [name] Secondly, patient refused all AM medications. MD informed of patient refusal.    S[place], RN

## 2019-07-24 NOTE — Progress Notes (Signed)
Patient appears calmer. She is still refusing to take her medications. She is still talking to herself.  She is able to take sips of water.

## 2019-07-24 NOTE — Progress Notes (Signed)
Upon assessment, pt is agitated, muttering, and repeating phrases. Pt is able to answer questions with excessive stuttering and is AxOX3. Pt at times yelling out. Not at baseline. On call provider paged to assess. Vitals stable. CBG at 88. Provider at bedside and orders received for ativan prn. Family updated and questions answered.

## 2019-07-24 NOTE — Progress Notes (Signed)
Patient very agitated towards staff and son. Dr. Wyline Copas notified. 0.5 mg Ativan IM given.

## 2019-07-24 NOTE — Consult Note (Addendum)
[facility] Face-to-Face Psychiatry Consult   Reason for Consult:  Agitation Referring Physician:  Dr Wyline Copas Patient Identification: Erika [name] MRN:  [id number] Principal Diagnosis: Myeloproliferative disorder [facility]) Diagnosis:  Principal Problem:   Myeloproliferative disorder ([place]) Active Problems:   Agitation   Obesity, Class III, BMI 40-49.9 (morbid obesity) ([place])   Essential hypertension   Moderate persistent asthma   AF (paroxysmal atrial fibrillation) (H[facility])   AKI (acute kidney injury) ([place])   Thrombocytopenia (H[facility])   Hyperuricemia   Tumor lysis syndrome   Total Time spent with patient: 1 hour  Subjective:   Erika [name] is a 78 y.o. female patient admitted with back issues.  Patient seen and evaluated in person by this provider.  She was agitated on assessment and difficult to understand.  She wanted this provider to go away and her son who was at the bedside but then called her son back.  He reports she gets this way when she has not had much sleep and once she sleeps she wakes up and is her normal self.  He states she has not slept well since being in the hospital.  He also reports that she came very upset when she found out she was going to a SNF and her behavior changed.  No prior psychiatric issues, treatment recommendations below  HPI per Erika [name] is a 78 y.o. 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   Patient cites a history of low back pain and says this pain is similar. It began about 2 days ago and is described as moderate to severe. It is on the left side. Hurts to move, particularly to get up from standing. Does not radiate. No weakness or numbness or saddle anesthesia or new gait problems. No changes to bowel/bladder dysfunction. Denies IV drug use. No recent trauma. No fevers or chills, no nausea, no  vomiting. Says current wheeze is more or less at her baseline. Denies cough or covid contacts. Denies worsening dyspnea on exertion. Denies recent med changes.     Past Psychiatric History: none  Risk to Self:  none Risk to Others:  none Prior Inpatient Therapy:  none Prior Outpatient Therapy:  none  Past Medical History:  Past Medical History:  Diagnosis Date   Anemia    with menses   Asthma    Back pain    status post surgery 2002   Breast cyst    Excesion with FNA, begnin in 2004.    Degenerative joint disease of spine    Imaging 2005,  Degenerative hypertrophic facet arthritis changes L4-5 and L5-S1.[name]    History of shingles    Recurrent with post herpetic neuralgia.    Hypertension    Lymphadenopathy    Of the mediastinum, Right side CXR 2008, not read on 2010 cxr.    Menopause    Obesity    BMI 54   Psychosis ([place])    Secondary to prednisone.   Shingles    Stasis dermatitis    W/ LE edema, prviously on lasix now on mazxide.    SVT (supraventricular tachycardia) [facility]) June 2009   one run while hospitalized   Tuberculosis    active TB treated in 2002, hx of paraspinal lumbar TB,     Past Surgical History:  Procedure Laterality Date   BREAST CYST ASPIRATION Left    Breast cyst  biopsy     CHOLECYSTECTOMY  02/02/2012   Procedure: LAPAROSCOPIC CHOLECYSTECTOMY;  Surgeon: Zenovia Jarred, MD;  Location: [place];  Service: General;  Laterality: N/A;   Hemilaminectomy of L4, L5 and S1 decompression of tumor in epidural space  2000   IR FLUORO GUIDED NEEDLE PLC ASPIRATION/INJECTION LOC  07/17/2019   Family History:  Family History  Problem Relation Age of Onset   Stroke Mother        at young age   Heart disease Mother    Heart attack Father    Heart disease Father    Cancer Sister        Unknown   Stroke Brother    Stroke Brother    Breast cancer Other    Sickle cell anemia Brother    Diabetes Brother    Stroke Brother    Family Psychiatric  History: see  above Social History:  Social History   Substance and Sexual Activity  Alcohol Use No   Alcohol/week: 0.0 standard drinks     Social History   Substance and Sexual Activity  Drug Use No    Social History   Socioeconomic History   Marital status: Widowed    Spouse name: Not on file   Number of children: Not on file   Years of education: Not on file   Highest education level: Not on file  Occupational History   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 There are not steps up to the entrance the patient uses. One son stays with her once in a while.      Patient's method of transportation is personal car.      The highest level of education was some high school: 11th grade.      The patient currently retired.      Identified important Relationships are "My friend Mart Piggs, my nieces, my grandkids, a lot of people.       Pets : None       Interests / Fun: "Playing word games on my phone, watch TV, Talking on phone."       Current Stressors: "Nothing, really. I try not to let anything stress me."       Religious / Personal Beliefs: "I believe in God."       L. Ducatte, RN, BSN    Social Determinants of Health   Financial Resource Strain:    Difficulty of Paying Living Expenses: Not on file  Food Insecurity:    Worried About Charity fundraiser in the Last Year: Not on file   [employer] of Food in the Last Year: Not on file  Transportation Needs:    Lack of Transportation (Medical): Not on file    Lack of Transportation (Non-Medical): Not on file  Physical Activity:    Days of Exercise per Week: Not on file   Minutes of Exercise per Session: Not on file  Stress:  Feeling of Stress : Not on file  Social Connections:    Frequency of Communication with Friends and Family: Not on file   Frequency of Social Gatherings with Friends and Family: Not on file   Attends Religious Services: Not on file   Active Member of Clubs or Organizations: Not on file   Attends Archivist Meetings: Not on file   Marital Status: Not on file   Additional Social History:    Allergies:   Allergies  Allergen Reactions   Prednisone Other (See Comments)    REACTION: Psychosis, talking out of head, insomnia    Labs:  Results for orders placed or performed during the hospital encounter of 07/10/19 (from the past 48 hour(s))  Comprehensive metabolic panel     Status: Abnormal   Collection Time: 07/23/19  5:16 PM  Result Value Ref Range   Sodium 135 135 - 145 mmol/L   Potassium 4.0 3.5 - 5.1 mmol/L   Chloride 97 (L) 98 - 111 mmol/L   CO2 26 22 - 32 mmol/L   Glucose, Bld 100 (Erika) 70 - 99 mg/dL   BUN 17 8 - 23 mg/dL   Creatinine, Ser 1.01 (Erika) 0.44 - 1.00 mg/dL   Calcium 9.8 8.9 - 10.3 mg/dL   Total Protein 8.1 6.5 - 8.1 g/dL   Albumin 3.2 (L) 3.5 - 5.0 g/dL   AST 18 15 - 41 U/L   ALT 14 0 - 44 U/L   Alkaline Phosphatase 288 (Erika) 38 - 126 U/L   Total Bilirubin 0.5 0.3 - 1.2 mg/dL   GFR calc non Af Amer 54 (L) >60 mL/min   GFR calc Af Amer >60 >60 mL/min   Anion gap 12 5 - 15    Comment: Performed at [facility], [place] [address], [place], [place] [address]  CBC     Status: Abnormal   Collection Time: 07/23/19  5:16 PM  Result Value Ref Range   WBC 89.4 (HH) 4.0 - 10.5 K/uL    Comment: REPEATED TO VERIFY CRITICAL VALUE NOTED.  VALUE IS CONSISTENT WITH PREVIOUSLY REPORTED AND CALLED VALUE.    RBC 3.29 (L) 3.87 - 5.11 MIL/uL   Hemoglobin 10.1 (L) 12.0 - 15.0 g/dL    HCT 31.9 (L) 36.0 - 46.0 %   MCV 97.0 80.0 - 100.0 fL   MCH 30.7 26.0 - 34.0 pg   MCHC 31.7 30.0 - 36.0 g/dL   RDW 15.9 (Erika) 11.5 - 15.5 %   Platelets 341 150 - 400 K/uL   nRBC 0.0 0.0 - 0.2 %    Comment: Performed at [facility], [place] [address], [place], [place] [address]  Ammonia     Status: None   Collection Time: 07/23/19  5:16 PM  Result Value Ref Range   Ammonia 17 9 - 35 umol/L    Comment: Performed at [facility], [place] [address] [place], [place] [address]  Glucose, capillary     Status: None   Collection Time: 07/23/19  8:27 PM  Result Value Ref Range   Glucose-Capillary 88 70 - 99 mg/dL  Urinalysis, Routine w reflex microscopic     Status: Abnormal   Collection Time: 07/23/19  8:41 PM  Result Value Ref Range   Color, Urine YELLOW YELLOW   APPearance CLEAR CLEAR   Specific Gravity, Urine 1.015 1.005 - 1.030   pH 7.0 5.0 - 8.0   Glucose, UA NEGATIVE NEGATIVE mg/dL   Hgb urine dipstick  MODERATE (A) NEGATIVE   Bilirubin Urine NEGATIVE NEGATIVE   Ketones, ur NEGATIVE NEGATIVE mg/dL   Protein, ur 30 (A) NEGATIVE mg/dL   Nitrite NEGATIVE NEGATIVE   Leukocytes,Ua NEGATIVE NEGATIVE   RBC / HPF 6-10 0 - 5 RBC/hpf   WBC, UA 0-5 0 - 5 WBC/hpf   Bacteria, UA NONE SEEN NONE SEEN   Squamous Epithelial / LPF 0-5 0 - 5   Mucus PRESENT     Comment: Performed at [facility], [place] [address], [place], [place] [address]  Glucose, capillary     Status: None   Collection Time: 07/24/19  1:25 AM  Result Value Ref Range   Glucose-Capillary 81 70 - 99 mg/dL  Comprehensive metabolic panel     Status: Abnormal   Collection Time: 07/24/19  4:55 AM  Result Value Ref Range   Sodium 137 135 - 145 mmol/L   Potassium 3.8 3.5 - 5.1 mmol/L   Chloride 99 98 - 111 mmol/L   CO2 27 22 - 32 mmol/L   Glucose, Bld 84 70 - 99 mg/dL   BUN 18 8 - 23 mg/dL   Creatinine, Ser 0.87 0.44 - 1.00 mg/dL   Calcium 9.6 8.9 - 10.3 mg/dL   Total  Protein 8.0 6.5 - 8.1 g/dL   Albumin 3.3 (L) 3.5 - 5.0 g/dL   AST 16 15 - 41 U/L   ALT 14 0 - 44 U/L   Alkaline Phosphatase 267 (Erika) 38 - 126 U/L   Total Bilirubin 0.5 0.3 - 1.2 mg/dL   GFR calc non Af Amer >60 >60 mL/min   GFR calc Af Amer >60 >60 mL/min   Anion gap 11 5 - 15    Comment: Performed at [facility], [place] [address], [place], [place] [address]    Current Facility-Administered Medications  Medication Dose Route Frequency Provider Last Rate Last Admin   albuterol (PROVENTIL) (2.5 MG/3ML) 0.083% nebulizer solution 2.5 mg  2.5 mg Nebulization Q6H PRN Adele Barthel D, MD       allopurinol (ZYLOPRIM) tablet 150 mg  150 mg Oral BID Adele Barthel D, MD   Stopped at 07/24/19 0930   amLODipine (NORVASC) tablet 10 mg  10 mg Oral Daily Blain Pais, MD   Stopped at 07/24/19 0931   apixaban (ELIQUIS) tablet 5 mg  5 mg Oral BID Blain Pais, MD   Stopped at 07/24/19 0931   HYDROcodone-acetaminophen (NORCO) 7.5-325 MG per tablet 1 tablet  1 tablet Oral Q6H PRN Adele Barthel D, MD   1 tablet at 07/22/19 0324   LORazepam (ATIVAN) injection 0.5 mg  0.5 mg Intramuscular Q6H PRN Donne Hazel, MD   0.5 mg at 07/24/19 1343   metoprolol tartrate (LOPRESSOR) tablet 50 mg  50 mg Oral BID Donne Hazel, MD   Stopped at 07/24/19 0935   mometasone-formoterol (DULERA) 200-5 MCG/ACT inhaler 2 puff  2 puff Inhalation BID Adele Barthel D, MD   2 puff at 07/23/19 2006   nystatin (MYCOSTATIN/NYSTOP) topical powder   Topical BID Donne Hazel, MD       pantoprazole (PROTONIX) EC tablet 40 mg  40 mg Oral Daily Blain Pais, MD   Stopped at 07/24/19 0931   polyethylene glycol (MIRALAX / GLYCOLAX) packet 17 g  17 g Oral Daily Adele Barthel D, MD   17 g at 07/24/19 0935   pravastatin (PRAVACHOL) tablet 20 mg  20 mg Oral Daily Blain Pais, MD  Stopped at 07/24/19 0931   QUEtiapine (SEROQUEL) tablet 25 mg  25 mg Oral QHS Donne Hazel, MD       tiZANidine (ZANAFLEX) tablet 4 mg  4 mg Oral Q8H PRN Blain Pais, MD   4 mg at 07/22/19 1146    Musculoskeletal: Strength & Muscle Tone: decreased Gait & Station: unable to stand Patient leans: N/A  Psychiatric Specialty Exam: Physical Exam  Nursing note and vitals reviewed. Constitutional: She appears well-developed and well-nourished.  HENT:  Head: Normocephalic.  Respiratory: Effort normal.  Musculoskeletal:     Cervical back: Normal range of motion.  Neurological: She is alert.  Psychiatric: Thought content normal. Her mood appears anxious. Her affect is blunt. Her speech is tangential and slurred. She is agitated. Cognition and memory are impaired. She expresses inappropriate judgment. She is inattentive.    Review of Systems  Psychiatric/Behavioral: Positive for sleep disturbance. The patient is nervous/anxious.   All other systems reviewed and are negative.   Blood pressure 128/78, pulse 98, temperature 98.7 F (37.1 C), resp. rate 20, height 5\' 6"  (1.676 m), weight 136 kg, SpO2 94 %.Body mass index is 48.39 kg/m.  General Appearance: Disheveled  Eye Contact:  Fair  Speech:  Slurred  Volume:  Increased  Mood:  Anxious and Irritable  Affect:  Congruent  Thought Process:  Disorganized  Orientation:  Other:  person  Thought Content:  Illogical and Tangential  Suicidal Thoughts:  No  Homicidal Thoughts:  No  Memory:  Immediate;   Poor Recent;   Poor Remote;   Poor  Judgement:  Impaired  Insight:  Lacking  Psychomotor Activity:  Increased  Concentration:  Concentration: Poor and Attention Span: Poor  Recall:  Poor  Fund of Knowledge:  Poor  Language:  Fair  Akathisia:  No  Handed:  Right  AIMS (if indicated):     Assets:  Leisure Time Resilience Social Support  ADL's:  Impaired  Cognition:  Impaired,  Moderate  Sleep:      78 year old female admitted for back issues related to a tumor, consult placed for agitation.  Her son is at the  bedside and provides information.  Denies any past psychiatric issues or medications.  He does say that she has episodes like this when she does not get enough sleep and has not slept well in the hospital.  Once she gets sleep, she awakens at her baseline.  He does report that she was upset that she was going to a SNF.  Patient disorganized on assessment with agitation.  Treatment Plan Summary: Agitation: -Recommend Seroquel 12.5 mg BID PRN and maintain Seroquel 25 mg at bedtime for sleep--If this is not effective, change to Zyprexa 5 mg at bedtime and BID PRN agitation  Insomnia: -Continue Seroquel 25 mg at bedtime  Disposition: Patient does not meet criteria for psychiatric inpatient admission.  Waylan Boga, NP 07/24/2019 3:31 PM  Case discussed and plan agreed upon as per above.

## 2019-07-24 NOTE — Progress Notes (Signed)
Patient belongings at the bedside with patient. Staff has tried to explain policy of valuables. She is unwilling to part with her purse at this time.

## 2019-07-24 NOTE — Progress Notes (Signed)
PROGRESS NOTE    Erika [name]  JXB:[id number] DOB: 1941/05/01 DOA: 07/10/2019 PCP: [name], MD    Brief Narrative:  78 year old African-American female, morbidly obese with BMI of 48 kg/m, and past medical history significant for myeloproliferative neoplasm NOS-didnt see oncology for 7-89month, hypertension, asthma, paroxysmal atrial fibrillation and status post resection of lumbar spine mass.  Patient also has history of chronic lower back pain, with possible radiculopathy.  Patient presented with worsening right-sided lower back pain and the inability to ambulate over 2 days. -In the emergency room she was noted to have a white count of 107 compared to previous level of 61, creatinine of 1.4, uric acid of 14, ESR of 70, CT pelvis nonspecific, question septic arthritis versus sacroiliitis, seen by orthopedics did not feel this was consistent with septic arthritis -Seen by oncology for profound leukocytosis, concern for tumor lysis syndrome -Underwent bone marrow aspiration biopsy 12/7  Assessment & Plan:   Principal Problem:   Myeloproliferative disorder ([place] Active Problems:   Obesity, Class III, BMI 40-49.9 (morbid obesity) ([facility]   Essential hypertension   Moderate persistent asthma   AF (paroxysmal atrial fibrillation) (HCC)   AKI (acute kidney injury) ([facility]   Thrombocytopenia (HCC)   Hyperuricemia   Tumor lysis syndrome   Agitation    Severe back pain/inability to ambulate/possible left-sided sacroiliitis versus septic left sacral iliac joint: -Patient has acute on chronic left hip and lower back pain along with radiculopathy -She was treated for paraspinal/lumbar TB in 2002, and also underwent hemilaminectomy of L4-L5 and S1, decompression of tumor and epidural space, which was later noted to be granulomatous disease from TB -Not have constitutional symptoms, fever chills and she has a chronically elevated ESR which could be explained by her progressive myelopathy  proliferative neoplasm.  -Seen by Orthopedics in consultation, infection felt to be less likely -However, given her worsening symptoms, limitations in ambulation and complex history of TB in her LS spine agree with MRI spine and left hip with and without contrast. [facility]  -MRI of spine negative for evidence of infectious process but left SI joint could not have septic arthritis ruled out.  -Patient underwent SI joint aspiration on 12/11, fluid Gram stain with no organism. Culture is NGTD x3 days. Will continue monitoring off Abx.  -She reports feeling better and being able to move her left leg after starting tizanidine which will be continued.    Continue:  -Pain control -Patient reports no pain in her left hip now with no indication for left hip injection.  -Will continue tizanidine as needed as muscle relaxer.  -Continue heating pad, tylenol as needed.  -Continue PT and OT. Planning SNF placement.  CSW consulted and currently following -COVID test from 12/16 neg  Severe leukocytosis, myeloproliferative Neoplasm NOS: -WBC 107 on presentation,  -Oncology Dr. KIrene Limbo following -She is s/p bone marrow biopsy, results per Hem/Onc, with no transformation/leukemia. Hydroxyurea held.  -WBC now trending down  Elevated uric acid level/tumor lysis syndrome -Uric acid was 14.1 on presentation. Down to 3.4 now.  -Calcium, potassium within normal range, kidney function stable -Continued on daily allopurinol, plan to cont on d/c  Paroxysmal atrial fibrillation: -Heart rate is controlled now but was slightly elevated earlier today -Continue metoprolol 12.5 mg p.o. twice daily.  -Continue with Apixaban 5 mg p.o. twice daily as tolerated  Morbid obesity: -Needs aggressive lifestyle modification  Possible lower lobe  infiltrates as per chest x-ray on presentation: -Repeat CXR with no infiltrate and patient with no symptoms to  indicate pneumonia.  -Continue monitoring off antibiotics.   Acute Kidney injury: -Improved with hydration -recheck bmet in AM  Acute toxic metabolic encephalopathy -Recent finding this admit -Patient alert and oriented x 3, but displaying paranoia and grandiose ideas -Head CT personally reviewed, neg -Had added seroquel 13m QHS -Consulted Psychiatry who recommends adding 12.511mPRN seroquel and if no improvement, then change to Zyprexa 13m713mHS and BID PRN agitation  Bladder outlet obstruction -New finding this admit -repeated bladder outlet obstruction over past 24hrs, now with indwelling cath  DVT prophylaxis: Eliquis Code Status: Full Family Communication: Pt in room, family not at bedside Disposition Plan: SNF, when stable  Consultants:   Heme/Onc  Ortho  IR  Procedures:  Bone Marrow biopsy scheduled for 07/13/19 Antimicrobials:   IV vancomycin 12/5-12/6  IV ceftriaxone  07/11/2019-07/12/2019.  IV cefepime discontinued on 12/6  Antimicrobials: Anti-infectives (From admission, onward)   Start     Dose/Rate Route Frequency Ordered Stop   07/12/19 1000  vancomycin (VANCOCIN) 1,500 mg in sodium chloride 0.9 % 500 mL IVPB  Status:  Discontinued     1,500 mg 250 mL/hr over 120 Minutes Intravenous Every 36 hours 07/11/19 0617 07/12/19 1537   07/11/19 1800  ceFEPIme (MAXIPIME) 2 g in sodium chloride 0.9 % 100 mL IVPB  Status:  Discontinued     2 g 200 mL/hr over 30 Minutes Intravenous Every 12 hours 07/11/19 0615 07/11/19 1445   07/11/19 1600  cefTRIAXone (ROCEPHIN) 2 g in sodium chloride 0.9 % 100 mL IVPB  Status:  Discontinued     2 g 200 mL/hr over 30 Minutes Intravenous Every 24 hours 07/11/19 1445 07/12/19 1537   07/10/19 2245  vancomycin (VANCOCIN) 2,000 mg in sodium chloride 0.9 % 500 mL IVPB     2,000 mg 250 mL/hr over 120 Minutes Intravenous  Once 07/10/19 2234 07/11/19 0234   07/10/19 2245  ceFEPIme (MAXIPIME) 2 g in sodium chloride 0.9 % 100 mL IVPB   Status:  Discontinued     2 g 200 mL/hr over 30 Minutes Intravenous Every 8 hours 07/10/19 2234 07/11/19 0615      Subjective: Oriented, but yelling about god and yelling "thank you Jesus"  Objective: Vitals:   07/23/19 2006 07/23/19 2034 07/24/19 0100 07/24/19 0532  BP:  (!) 148/77 (!) 143/77 128/78  Pulse:  (!) 114 99 98  Resp:  _0 Temp:  98.8 F (37.1 C) 99.1 F (37.3 C) 98.7 F (37.1 C)  TempSrc:   Axillary   SpO2: 91% 97% 97% 94%  Weight:      Height:        Intake/Output Summary (Last 24 hours) at 07/24/2019 1637 Last data filed at 07/24/2019 1300 Gross per 24 hour  Intake 240 ml  Output 800 ml  Net -560 ml   Filed Weights   07/10/19 1639 07/11/19 1119 07/13/19 1058  Weight: 134.7 kg 135.1 kg 136 kg    Examination: General exam: Conversant, in no acute distress Respiratory system: normal chest rise, clear, no audible wheezing Cardiovascular system: regular rhythm, s1-s2 Gastrointestinal system: Nondistended, nontender, pos BS Central nervous system: No seizures, no tremors Extremities: No cyanosis, no joint deformities Skin: No rashes, no pallor Psychiatry: appears manic, yelling about Jesus  Data Reviewed: I have personally reviewed following labs and imaging studies  CBC: Recent Labs  Lab 07/19/19 0523 07/20/19 0545 07/22/19  8341 07/23/19 1716  WBC 95.2* 96.5* 89.1* 89.4*  NEUTROABS  --  84.8* 77.0*  --   HGB 8.5* 9.2* 8.8* 10.1*  HCT 27.7* 29.6* 27.9* 31.9*  MCV 99.3 98.3 98.6 97.0  PLT 223 240 263 962   Basic Metabolic Panel: Recent Labs  Lab 07/19/19 0523 07/20/19 0545 07/22/19 0536 07/23/19 1716 07/24/19 0455  NA 137 134* 135 135 137  K 4.7 4.3 4.4 4.0 3.8  CL 98 98 98 97* 99  CO2 _0 GLUCOSE 29* 78 82 100* 84  BUN _1 CREATININE 1.01* 0.86 1.03* 1.01* 0.87  CALCIUM 9.6 9.5 9.5 9.8 9.6   GFR: Estimated Creatinine Clearance: 78.2 mL/min (by C-G formula based on SCr of 0.87 mg/dL). Liver  Function Tests: Recent Labs  Lab 07/23/19 1716 07/24/19 0455  AST 18 16  ALT 14 14  ALKPHOS 288* 267*  BILITOT 0.5 0.5  PROT 8.1 8.0  ALBUMIN 3.2* 3.3*   No results for input(s): LIPASE, AMYLASE in the last 168 hours. Recent Labs  Lab 07/23/19 1716  AMMONIA 17   Coagulation Profile: No results for input(s): INR, PROTIME in the last 168 hours. Cardiac Enzymes: No results for input(s): CKTOTAL, CKMB, CKMBINDEX, TROPONINI in the last 168 hours. BNP (last 3 results) No results for input(s): PROBNP in the last 8760 hours. HbA1C: No results for input(s): HGBA1C in the last 72 hours. CBG: Recent Labs  Lab 07/19/19 0725 07/23/19 2027 07/24/19 0125  GLUCAP 97 88 81   Lipid Profile: No results for input(s): CHOL, HDL, LDLCALC, TRIG, CHOLHDL, LDLDIRECT in the last 72 hours. Thyroid Function Tests: No results for input(s): TSH, T4TOTAL, FREET4, T3FREE, THYROIDAB in the last 72 hours. Anemia Panel: No results for input(s): VITAMINB12, FOLATE, FERRITIN, TIBC, IRON, RETICCTPCT in the last 72 hours. Sepsis Labs: No results for input(s): PROCALCITON, LATICACIDVEN in the last 168 hours.  Recent Results (from the past 240 hour(s))  Aerobic/Anaerobic Culture (surgical/deep wound)     Status: None   Collection Time: 07/17/19 10:35 AM   Specimen: Wound; Synovial Fluid  Result Value Ref Range Status   Specimen Description   Final    WOUND LT SACROILIAC ASPIRATION Performed at [facility] [address] [place], [place] [address]    Special Requests   Final    Normal Performed at [facility], [place] [address], [place], [place] [address]    Gram Stain   Final    RARE WBC PRESENT, PREDOMINANTLY PMN NO ORGANISMS SEEN    Culture   Final    No growth aerobically or anaerobically. Performed at [facility], [place] [address] [place], [place] [address]    Report Status 07/22/2019 FINAL  Final  SARS CORONAVIRUS 2 (TAT 6-24 HRS)  Nasopharyngeal Nasopharyngeal Swab     Status: None   Collection Time: 07/22/19  2:52 PM   Specimen: Nasopharyngeal Swab  Result Value Ref Range Status   SARS Coronavirus 2 NEGATIVE NEGATIVE Final    Comment: (NOTE) SARS-CoV-2 target nucleic acids are NOT DETECTED. The SARS-CoV-2 RNA is generally detectable in upper and lower respiratory specimens during the acute phase of infection. Negative results do not preclude SARS-CoV-2 infection, do not rule out co-infections with other pathogens, and should not be used as the sole basis for treatment or other patient management decisions. Negative results must be combined with clinical observations, patient history, and epidemiological information. The expected result is Negative. Fact  Sheet for Patients: SugarRoll.be Fact Sheet for Healthcare Providers: https://www.woods-mathews.com/ This test is not yet approved or cleared by the [place] FDA and  has been authorized for detection and/or diagnosis of SARS-CoV-2 by FDA under an Emergency Use Authorization (EUA). This EUA will remain  in effect (meaning this test can be used) for the duration of the COVID-19 declaration under Section 56 4(b)(1) of the Act, 21 U.S.C. section 360bbb-3(b)(1), unless the authorization is terminated or revoked sooner. Performed at [facility], [place] [address] [place], [place] [address]      Radiology Studies: CT HEAD WO CONTRAST  Result Date: 07/23/2019 CLINICAL DATA:  Encephalopathy. Cerebral hemorrhage. Aneurysm suspected. EXAM: CT HEAD WITHOUT CONTRAST TECHNIQUE: Contiguous axial images were obtained from the base of the skull through the vertex without intravenous contrast. COMPARISON:  02/17/2008 FINDINGS: Brain: Mild generalized atrophy. Mild small vessel change of the hemispheric white matter. No sign of acute infarction, mass lesion, hemorrhage, hydrocephalus or extra-axial collection. Vascular: There  is atherosclerotic calcification of the major vessels at the base of the brain. Skull: Negative Sinuses/Orbits: Clear/normal Other: None IMPRESSION: No acute or reversible finding. Mild age related volume loss. Minimal small vessel change of the hemispheric white matter. Electronically Signed   By: Nelson Chimes M.D.   On: 07/23/2019 17:10   DG CHEST PORT 1 VIEW  Result Date: 07/23/2019 CLINICAL DATA:  Pneumonia EXAM: PORTABLE CHEST 1 VIEW COMPARISON:  07/14/2019 FINDINGS: Cardiomegaly. Hilar prominence noted which could be related to vascular congestion, but cannot exclude adenopathy. Particularly in the right hilum. No confluent opacities or effusions. No edema. No acute bony abnormality. IMPRESSION: Cardiomegaly. Bilateral hilar prominence, particularly on the right. Cannot exclude adenopathy. This could be further evaluated with contrast-enhanced chest CT if felt clinically indicated. Electronically Signed   By: Rolm Baptise M.D.   On: 07/23/2019 19:32    Scheduled Meds: . allopurinol  150 mg Oral BID  . amLODipine  10 mg Oral Daily  . apixaban  5 mg Oral BID  . metoprolol tartrate  50 mg Oral BID  . mometasone-formoterol  2 puff Inhalation BID  . nystatin   Topical BID  . pantoprazole  40 mg Oral Daily  . polyethylene glycol  17 g Oral Daily  . pravastatin  20 mg Oral Daily  . QUEtiapine  25 mg Oral QHS   Continuous Infusions:   LOS: 14 days   Marylu Lund, MD [facility] Pager On Amion  If 7PM-7AM, please contact night-coverage 07/24/2019, 4:37 PM

## 2019-07-25 NOTE — Progress Notes (Signed)
Patient was very anxious at bedtime, refusing care. Ativan prn given. Was able to get VS after multiple attempt. Took po meds with granddaughter  assist over the phone.

## 2019-07-25 NOTE — Progress Notes (Signed)
Pt's granddaughter and son Annie Main called to see if the visitor could be changed. Primary visitor at this time is not agreeable to change.  Follow up call placed to patients granddaughter Araceli Bouche regarding visitation policy. No answer and unable to leave voicemail message.

## 2019-07-25 NOTE — Progress Notes (Signed)
Spoke to patients son Gerald Stabs at bedside today regarding continued plan of care. Call placed to patients granddaughter Jerral Bonito [phone] upon request from patient and son for daily update. No answer and unable to leave voicemail message.

## 2019-07-25 NOTE — Progress Notes (Signed)
PROGRESS NOTE    Erika [name]  ZOX:[id number] DOB: [birth year] DOA: 07/10/2019 PCP: [name], MD    Brief Narrative:  78 year old African-American female, morbidly obese with BMI of 48 kg/m, and past medical history significant for myeloproliferative neoplasm NOS-didnt see oncology for 7-79month, hypertension, asthma, paroxysmal atrial fibrillation and status post resection of lumbar spine mass.  Patient also has history of chronic lower back pain, with possible radiculopathy.  Patient presented with worsening right-sided lower back pain and the inability to ambulate over 2 days. -In the emergency room she was noted to have a white count of 107 compared to previous level of 61, creatinine of 1.4, uric acid of 14, ESR of 70, CT pelvis nonspecific, question septic arthritis versus sacroiliitis, seen by orthopedics did not feel this was consistent with septic arthritis -Seen by oncology for profound leukocytosis, concern for tumor lysis syndrome -Underwent bone marrow aspiration biopsy 12/7  Assessment & Plan:   Principal Problem:   Myeloproliferative disorder ([place] Active Problems:   Obesity, Class III, BMI 40-49.9 (morbid obesity) ([place]   Essential hypertension   Moderate persistent asthma   AF (paroxysmal atrial fibrillation) (HCC)   AKI (acute kidney injury) ([place]   Thrombocytopenia (HCC)   Hyperuricemia   Tumor lysis syndrome   Agitation    Severe back pain/inability to ambulate/possible left-sided sacroiliitis versus septic left sacral iliac joint: -Patient has acute on chronic left hip and lower back pain along with radiculopathy -She was treated for paraspinal/lumbar TB in 2002, and also underwent hemilaminectomy of L4-L5 and S1, decompression of tumor and epidural space, which was later noted to be granulomatous disease from TB -Not have constitutional symptoms, fever chills and she has a chronically elevated ESR which could be explained by her progressive myelopathy  proliferative neoplasm.  -Seen by Orthopedics in consultation, infection felt to be less likely -However, given her worsening symptoms, limitations in ambulation and complex history of TB in her LS spine agree with MRI spine and left hip with and without contrast. [facility]  -MRI of spine negative for evidence of infectious process but left SI joint could not have septic arthritis ruled out.  -Patient underwent SI joint aspiration on 12/11, fluid Gram stain with no organism. Culture is NGTD x3 days. Will continue monitoring off Abx.  -She reports feeling better and being able to move her left leg after starting tizanidine which will be continued.    Continue:  -Pain control -Patient reports no pain in her left hip now with no indication for left hip injection.  -Will continue tizanidine as needed as muscle relaxer.  -Continue heating pad, tylenol as needed.  -Continue PT and OT. Planning SNF placement when stable, SW folowing -COVID test from 12/16 neg  Severe leukocytosis, myeloproliferative Neoplasm NOS: -WBC 107 on presentation,  -Oncology Dr. KIrene Limbo following -She is s/p bone marrow biopsy, results per Hem/Onc, with no transformation/leukemia. Hydroxyurea held.  -No other hematologic interventions planned as of 12/11 per Heme with f/u planned 1-2 weeks after d/c -WBC currently stable at 89k  Elevated uric acid level/tumor lysis syndrome -Uric acid was 14.1 on presentation. Down to 3.4 now.  -Calcium, potassium within normal range, kidney function stable -Continued on daily allopurinol, plan to cont on d/c  Paroxysmal atrial fibrillation: -Heart rate is controlled now but was slightly elevated earlier today -Continue metoprolol 12.5 mg p.o. twice daily.  -Continue with Apixaban 5 mg  p.o. twice daily as tolerated  Morbid obesity: -Needs aggressive lifestyle modification  Possible lower lobe infiltrates as per  chest x-ray on presentation: -Repeat CXR with no infiltrate and patient with no symptoms to indicate pneumonia.  -Continue monitoring off antibiotics.   Acute Kidney injury: -Improved with hydration -repeat bmet in AM  Acute toxic metabolic encephalopathy -Recent finding this admit -Patient alert and oriented x 3, but displaying paranoia and grandiose ideas -Head CT personally reviewed, neg -Now on seroquel 50m QHS -Per psychiatry recommendations, on 12.524mPRN seroquel and if no improvement, then change to Zyprexa 49m249mHS and BID PRN agitation -Seems somewhat improved after starting zyprexa. Will follow  Bladder outlet obstruction -New finding this admit -repeated bladder outlet obstruction noted, now with indwelling cath  DVT prophylaxis: Eliquis Code Status: Full Family Communication: Pt in room, family not at bedside Disposition Plan: SNF, when stable  Consultants:   Heme/Onc  Ortho  IR  Procedures:  Bone Marrow biopsy scheduled for 07/13/19 Antimicrobials:   IV vancomycin 12/5-12/6  IV ceftriaxone  07/11/2019-07/12/2019.  IV cefepime discontinued on 12/6  Antimicrobials: Anti-infectives (From admission, onward)   Start     Dose/Rate Route Frequency Ordered Stop   07/12/19 1000  vancomycin (VANCOCIN) 1,500 mg in sodium chloride 0.9 % 500 mL IVPB  Status:  Discontinued     1,500 mg 250 mL/hr over 120 Minutes Intravenous Every 36 hours 07/11/19 0617 07/12/19 1537   07/11/19 1800  ceFEPIme (MAXIPIME) 2 g in sodium chloride 0.9 % 100 mL IVPB  Status:  Discontinued     2 g 200 mL/hr over 30 Minutes Intravenous Every 12 hours 07/11/19 0615 07/11/19 1445   07/11/19 1600  cefTRIAXone (ROCEPHIN) 2 g in sodium chloride 0.9 % 100 mL IVPB  Status:  Discontinued     2 g 200 mL/hr over 30 Minutes Intravenous Every 24 hours 07/11/19 1445 07/12/19 1537   07/10/19 2245  vancomycin (VANCOCIN) 2,000 mg in sodium chloride 0.9 % 500 mL IVPB     2,000 mg 250 mL/hr over 120  Minutes Intravenous  Once 07/10/19 2234 07/11/19 0234   07/10/19 2245  ceFEPIme (MAXIPIME) 2 g in sodium chloride 0.9 % 100 mL IVPB  Status:  Discontinued     2 g 200 mL/hr over 30 Minutes Intravenous Every 8 hours 07/10/19 2234 07/11/19 0615      Subjective: Pleased to know her COVID test was neg, yelling "Thank you Jesus"  Objective: Vitals:   07/25/19 0243 07/25/19 0906 07/25/19 0933 07/25/19 1335  BP: (!) 115/57  132/60 125/66  Pulse: 87  96 85  Resp: _0 Temp: 98.1 F (36.7 C)  97.9 F (36.6 C) (!) 97.5 F (36.4 C)  TempSrc: Axillary  Oral   SpO2: 93% 90% 95% 97%  Weight:      Height:        Intake/Output Summary (Last 24 hours) at 07/25/2019 1521 Last data filed at 07/25/2019 1200 Gross per 24 hour  Intake 0 ml  Output 550 ml  Net -550 ml   Filed Weights   07/10/19 1639 07/11/19 1119 07/13/19 1058  Weight: 134.7 kg 135.1 kg 136 kg    Examination: General exam: Awake, laying in bed, in nad Respiratory system: Normal respiratory effort, no wheezing Cardiovascular system: regular rate, s1, s2 Gastrointestinal system: Soft, nondistended, positive BS Central nervous system: CN2-12 grossly intact, strength intact Extremities: Perfused, no clubbing Skin: Normal skin turgor, no notable skin lesions seen Psychiatry: Manic, rapid speech  Data Reviewed: I have personally reviewed following labs and imaging studies  CBC: Recent Labs  Lab 07/19/19 0523 07/20/19 0545 07/22/19 0536 07/23/19 1716  WBC 95.2* 96.5* 89.1* 89.4*  NEUTROABS  --  84.8* 77.0*  --   HGB 8.5* 9.2* 8.8* 10.1*  HCT 27.7* 29.6* 27.9* 31.9*  MCV 99.3 98.3 98.6 97.0  PLT 223 240 263 016   Basic Metabolic Panel: Recent Labs  Lab 07/19/19 0523 07/20/19 0545 07/22/19 0536 07/23/19 1716 07/24/19 0455  NA 137 134* 135 135 137  K 4.7 4.3 4.4 4.0 3.8  CL 98 98 98 97* 99  CO2 _0 GLUCOSE 29* 78 82 100* 84  BUN _1 CREATININE 1.01* 0.86 1.03* 1.01* 0.87    CALCIUM 9.6 9.5 9.5 9.8 9.6   GFR: Estimated Creatinine Clearance: 78.2 mL/min (by C-G formula based on SCr of 0.87 mg/dL). Liver Function Tests: Recent Labs  Lab 07/23/19 1716 07/24/19 0455  AST 18 16  ALT 14 14  ALKPHOS 288* 267*  BILITOT 0.5 0.5  PROT 8.1 8.0  ALBUMIN 3.2* 3.3*   No results for input(s): LIPASE, AMYLASE in the last 168 hours. Recent Labs  Lab 07/23/19 1716  AMMONIA 17   Coagulation Profile: No results for input(s): INR, PROTIME in the last 168 hours. Cardiac Enzymes: No results for input(s): CKTOTAL, CKMB, CKMBINDEX, TROPONINI in the last 168 hours. BNP (last 3 results) No results for input(s): PROBNP in the last 8760 hours. HbA1C: No results for input(s): HGBA1C in the last 72 hours. CBG: Recent Labs  Lab 07/19/19 0725 07/23/19 2027 07/24/19 0125  GLUCAP 97 88 81   Lipid Profile: No results for input(s): CHOL, HDL, LDLCALC, TRIG, CHOLHDL, LDLDIRECT in the last 72 hours. Thyroid Function Tests: No results for input(s): TSH, T4TOTAL, FREET4, T3FREE, THYROIDAB in the last 72 hours. Anemia Panel: No results for input(s): VITAMINB12, FOLATE, FERRITIN, TIBC, IRON, RETICCTPCT in the last 72 hours. Sepsis Labs: No results for input(s): PROCALCITON, LATICACIDVEN in the last 168 hours.  Recent Results (from the past 240 hour(s))  Aerobic/Anaerobic Culture (surgical/deep wound)     Status: None   Collection Time: 07/17/19 10:35 AM   Specimen: Wound; Synovial Fluid  Result Value Ref Range Status   Specimen Description   Final    WOUND LT SACROILIAC ASPIRATION Performed at [facility] [address] [place], [place] [address]    Special Requests   Final    Normal Performed at [facility], [place] [address] [place], [place] [address]    Gram Stain   Final    RARE WBC PRESENT, PREDOMINANTLY PMN NO ORGANISMS SEEN    Culture   Final    No growth aerobically or anaerobically. Performed at [facility], [place] [address], [place], [place] [address]    Report Status 07/22/2019 FINAL  Final  SARS CORONAVIRUS 2 (TAT 6-24 HRS) Nasopharyngeal Nasopharyngeal Swab     Status: None   Collection Time: 07/22/19  2:52 PM   Specimen: Nasopharyngeal Swab  Result Value Ref Range Status   SARS Coronavirus 2 NEGATIVE NEGATIVE Final    Comment: (NOTE) SARS-CoV-2 target nucleic acids are NOT DETECTED. The SARS-CoV-2 RNA is generally detectable in upper and lower respiratory specimens during the acute phase of infection. Negative results do not preclude SARS-CoV-2 infection, do not rule out co-infections with other pathogens, and should not be used as the sole basis for treatment or  other patient management decisions. Negative results must be combined with clinical observations, patient history, and epidemiological information. The expected result is Negative. Fact Sheet for Patients: SugarRoll.be Fact Sheet for Healthcare Providers: https://www.woods-mathews.com/ This test is not yet approved or cleared by the [place] FDA and  has been authorized for detection and/or diagnosis of SARS-CoV-2 by FDA under an Emergency Use Authorization (EUA). This EUA will remain  in effect (meaning this test can be used) for the duration of the COVID-19 declaration under Section 56 4(b)(1) of the Act, 21 U.S.C. section 360bbb-3(b)(1), unless the authorization is terminated or revoked sooner. Performed at [facility], [place] [address], [place], [place] [address]   Culture, Urine     Status: None   Collection Time: 07/23/19  8:40 PM   Specimen: Urine, Random  Result Value Ref Range Status   Specimen Description   Final    URINE, RANDOM Performed at [facility] [address] [place], [place] [address]    Special Requests   Final    NONE Performed at [facility], [place] [address], [place], [place] [address]     Culture   Final    NO GROWTH Performed at [facility], [place] [address] [place], [place] [address]    Report Status 07/24/2019 FINAL  Final     Radiology Studies: CT HEAD WO CONTRAST  Result Date: 07/23/2019 CLINICAL DATA:  Encephalopathy. Cerebral hemorrhage. Aneurysm suspected. EXAM: CT HEAD WITHOUT CONTRAST TECHNIQUE: Contiguous axial images were obtained from the base of the skull through the vertex without intravenous contrast. COMPARISON:  02/17/2008 FINDINGS: Brain: Mild generalized atrophy. Mild small vessel change of the hemispheric white matter. No sign of acute infarction, mass lesion, hemorrhage, hydrocephalus or extra-axial collection. Vascular: There is atherosclerotic calcification of the major vessels at the base of the brain. Skull: Negative Sinuses/Orbits: Clear/normal Other: None IMPRESSION: No acute or reversible finding. Mild age related volume loss. Minimal small vessel change of the hemispheric white matter. Electronically Signed   By: Nelson Chimes M.D.   On: 07/23/2019 17:10   DG CHEST PORT 1 VIEW  Result Date: 07/23/2019 CLINICAL DATA:  Pneumonia EXAM: PORTABLE CHEST 1 VIEW COMPARISON:  07/14/2019 FINDINGS: Cardiomegaly. Hilar prominence noted which could be related to vascular congestion, but cannot exclude adenopathy. Particularly in the right hilum. No confluent opacities or effusions. No edema. No acute bony abnormality. IMPRESSION: Cardiomegaly. Bilateral hilar prominence, particularly on the right. Cannot exclude adenopathy. This could be further evaluated with contrast-enhanced chest CT if felt clinically indicated. Electronically Signed   By: Rolm Baptise M.D.   On: 07/23/2019 19:32    Scheduled Meds: . allopurinol  150 mg Oral BID  . amLODipine  10 mg Oral Daily  . apixaban  5 mg Oral BID  . metoprolol tartrate  50 mg Oral BID  . mometasone-formoterol  2 puff Inhalation BID  . nystatin   Topical BID  . pantoprazole  40 mg Oral Daily  . polyethylene  glycol  17 g Oral Daily  . pravastatin  20 mg Oral Daily  . QUEtiapine  25 mg Oral QHS   Continuous Infusions:   LOS: 15 days   Marylu Lund, MD [facility] Pager On Amion  If 7PM-7AM, please contact night-coverage 07/25/2019, 3:21 PM

## 2019-07-26 LAB — COMPREHENSIVE METABOLIC PANEL
ALT: 14 U/L (ref 0–44)
AST: 17 U/L (ref 15–41)
Albumin: 3.2 g/dL — ABNORMAL LOW (ref 3.5–5.0)
Alkaline Phosphatase: 242 U/L — ABNORMAL HIGH (ref 38–126)
Anion gap: 11 (ref 5–15)
BUN: 32 mg/dL — ABNORMAL HIGH (ref 8–23)
CO2: 27 mmol/L (ref 22–32)
Calcium: 9.3 mg/dL (ref 8.9–10.3)
Chloride: 96 mmol/L — ABNORMAL LOW (ref 98–111)
Creatinine, Ser: 1.3 mg/dL — ABNORMAL HIGH (ref 0.44–1.00)
GFR calc Af Amer: 46 mL/min — ABNORMAL LOW (ref >60)
GFR calc non Af Amer: 40 mL/min — ABNORMAL LOW (ref >60)
Glucose, Bld: 77 mg/dL (ref 70–99)
Potassium: 4.2 mmol/L (ref 3.5–5.1)
Sodium: 134 mmol/L — ABNORMAL LOW (ref 135–145)
Total Bilirubin: 0.3 mg/dL (ref 0.3–1.2)
Total Protein: 7.6 g/dL (ref 6.5–8.1)

## 2019-07-26 MED ORDER — LACTATED RINGERS IV SOLN: INTRAVENOUS | Status: DC

## 2019-07-26 NOTE — Progress Notes (Signed)
PROGRESS NOTE    Erika [name]  ENI:[id number] DOB: 09/04/1940 DOA: 07/10/2019 PCP: [name], MD    Brief Narrative:  78 year old African-American female, morbidly obese with BMI of 48 kg/m, and past medical history significant for myeloproliferative neoplasm NOS-didnt see oncology for 7-30month, hypertension, asthma, paroxysmal atrial fibrillation and status post resection of lumbar spine mass.  Patient also has history of chronic lower back pain, with possible radiculopathy.  Patient presented with worsening right-sided lower back pain and the inability to ambulate over 2 days. -In the emergency room she was noted to have a white count of 107 compared to previous level of 61, creatinine of 1.4, uric acid of 14, ESR of 70, CT pelvis nonspecific, question septic arthritis versus sacroiliitis, seen by orthopedics did not feel this was consistent with septic arthritis -Seen by oncology for profound leukocytosis, concern for tumor lysis syndrome -Underwent bone marrow aspiration biopsy 12/7  Assessment & Plan:   Principal Problem:   Myeloproliferative disorder ([facility] Active Problems:   Obesity, Class III, BMI 40-49.9 (morbid obesity) ([facility]   Essential hypertension   Moderate persistent asthma   AF (paroxysmal atrial fibrillation) (HCC)   AKI (acute kidney injury) ([place]   Thrombocytopenia (H[facility])   Hyperuricemia   Tumor lysis syndrome   Agitation    Severe back pain/inability to ambulate/possible left-sided sacroiliitis versus septic left sacral iliac joint: -Patient has acute on chronic left hip and lower back pain along with radiculopathy -She was treated for paraspinal/lumbar TB in 2002, and also underwent hemilaminectomy of L4-L5 and S1, decompression of tumor and epidural space, which was later noted to be granulomatous disease from TB -Not have constitutional symptoms, fever chills and she has a chronically elevated ESR which could be explained by her progressive myelopathy  proliferative neoplasm.  -Seen by Orthopedics in consultation, infection felt to be less likely -However, given her worsening symptoms, limitations in ambulation and complex history of TB in her LS spine agree with MRI spine and left hip with and without contrast. [facility]  -MRI of spine negative for evidence of infectious process but left SI joint could not have septic arthritis ruled out.  -Patient underwent SI joint aspiration on 12/11, fluid Gram stain with no organism. Culture is NGTD x3 days. Will continue monitoring off Abx.  -She reports feeling better and being able to move her left leg after starting tizanidine which will be continued.  -Continue heating pad, tylenol as needed.  -Continue PT and OT. Planning SNF placement when stable, SW folowing -COVID test from 12/16 neg  Severe leukocytosis, myeloproliferative Neoplasm NOS: -WBC 107 on presentation,  -Oncology Dr. KIrene Limbo following -She is s/p bone marrow biopsy, results per Hem/Onc, with no transformation/leukemia. Hydroxyurea held.  -No other hematologic interventions planned as of 12/11 per Heme with f/u planned 1-2 weeks after d/c -WBC currently stable at 89k on most recent check  Elevated uric acid level/tumor lysis syndrome -Uric acid was 14.1 on presentation. Down to 3.4 on most recent check -Calcium, potassium within normal range, kidney function stable -Continued on daily allopurinol, plan to cont on d/c  Paroxysmal atrial fibrillation: -Heart rate is controlled now but was slightly elevated earlier today -Continue metoprolol 12.5 mg p.o. twice daily.  -Continue with Apixaban 5 mg p.o. twice daily as tolerated -No sign of acute blood loss  Morbid obesity: -Needs aggressive lifestyle modification  Possible lower lobe infiltrates as per chest  x-ray on presentation: -Repeat CXR with no infiltrate and patient with no symptoms to indicate pneumonia.    -Continue monitoring off antibiotics.   Acute Kidney injury: -Initially improved with hydration -Pt noted to have decreased PO intake recently. Repeat Cr this AM increased to 1.3. Will start LR  Acute toxic metabolic encephalopathy -Recent finding this admit -Patient alert and oriented x 3, but had displayed paranoia and grandiose ideas -Head CT personally reviewed, neg -Now on seroquel 85m QHS -Per psychiatry recommendations add 12.567mPRN seroquel -Seems improved this AM  Bladder outlet obstruction -New finding this admit -repeated bladder outlet obstruction noted, now with indwelling cath -Repeat bmet in AM  DVT prophylaxis: Eliquis Code Status: Full Family Communication: Pt in room, family not at bedside Disposition Plan: SNF, when stable  Consultants:   Heme/Onc  Ortho  IR  Procedures:  Bone Marrow biopsy scheduled for 07/13/19 Antimicrobials:   IV vancomycin 12/5-12/6  IV ceftriaxone  07/11/2019-07/12/2019.  IV cefepime discontinued on 12/6  Antimicrobials: Anti-infectives (From admission, onward)   Start     Dose/Rate Route Frequency Ordered Stop   07/12/19 1000  vancomycin (VANCOCIN) 1,500 mg in sodium chloride 0.9 % 500 mL IVPB  Status:  Discontinued     1,500 mg 250 mL/hr over 120 Minutes Intravenous Every 36 hours 07/11/19 0617 07/12/19 1537   07/11/19 1800  ceFEPIme (MAXIPIME) 2 g in sodium chloride 0.9 % 100 mL IVPB  Status:  Discontinued     2 g 200 mL/hr over 30 Minutes Intravenous Every 12 hours 07/11/19 0615 07/11/19 1445   07/11/19 1600  cefTRIAXone (ROCEPHIN) 2 g in sodium chloride 0.9 % 100 mL IVPB  Status:  Discontinued     2 g 200 mL/hr over 30 Minutes Intravenous Every 24 hours 07/11/19 1445 07/12/19 1537   07/10/19 2245  vancomycin (VANCOCIN) 2,000 mg in sodium chloride 0.9 % 500 mL IVPB     2,000 mg 250 mL/hr over 120 Minutes Intravenous  Once 07/10/19 2234 07/11/19 0234   07/10/19 2245  ceFEPIme (MAXIPIME) 2 g in sodium chloride  0.9 % 100 mL IVPB  Status:  Discontinued     2 g 200 mL/hr over 30 Minutes Intravenous Every 8 hours 07/10/19 2234 07/11/19 0615      Subjective: Without complaints this AM. Denies sob. Reports sleeping better last night  Objective: Vitals:   07/25/19 2139 07/26/19 0543 07/26/19 0911 07/26/19 1416  BP: 138/68 128/60 (!) 153/75 114/67  Pulse: 96 83 92 82  Resp: 17 18  14   Temp: (!) 97.5 F (36.4 C) 98.3 F (36.8 C)  97.8 F (36.6 C)  TempSrc: Oral Oral  Oral  SpO2: 96% 95%  99%  Weight:      Height:        Intake/Output Summary (Last 24 hours) at 07/26/2019 1518 Last data filed at 07/26/2019 1500 Gross per 24 hour  Intake 370.44 ml  Output 1100 ml  Net -729.56 ml   Filed Weights   07/10/19 1639 07/11/19 1119 07/13/19 1058  Weight: 134.7 kg 135.1 kg 136 kg    Examination: General exam: Conversant, in no acute distress Respiratory system: normal chest rise, clear, no audible wheezing Cardiovascular system: regular rhythm, s1-s2 Gastrointestinal system: Nondistended, nontender, pos BS Central nervous system: No seizures, no tremors Extremities: No cyanosis, no joint deformities Skin: No rashes, no pallor Psychiatry: Affect normal // no auditory hallucinations   Data Reviewed: I have personally reviewed following labs and imaging studies  CBC: Recent Labs  Lab 07/20/19 0545 07/22/19 0536 07/23/19 1716  WBC 96.5* 89.1* 89.4*  NEUTROABS 84.8* 77.0*  --   HGB 9.2* 8.8* 10.1*  HCT 29.6* 27.9* 31.9*  MCV 98.3 98.6 97.0  PLT 240 263 616   Basic Metabolic Panel: Recent Labs  Lab 07/20/19 0545 07/22/19 0536 07/23/19 1716 07/24/19 0455 07/26/19 0522  NA 134* 135 135 137 134*  K 4.3 4.4 4.0 3.8 4.2  CL 98 98 97* 99 96*  CO2 25 27 26 27 27   GLUCOSE 78 82 100* 84 77  BUN 21 21 17 18  32*  CREATININE 0.86 1.03* 1.01* 0.87 1.30*  CALCIUM 9.5 9.5 9.8 9.6 9.3   GFR: Estimated Creatinine Clearance: 52.3 mL/min (A) (by C-G formula based on SCr of 1.3 mg/dL  (H)). Liver Function Tests: Recent Labs  Lab 07/23/19 1716 07/24/19 0455 07/26/19 0522  AST 18 16 17   ALT 14 14 14   ALKPHOS 288* 267* 242*  BILITOT 0.5 0.5 0.3  PROT 8.1 8.0 7.6  ALBUMIN 3.2* 3.3* 3.2*   No results for input(s): LIPASE, AMYLASE in the last 168 hours. Recent Labs  Lab 07/23/19 1716  AMMONIA 17   Coagulation Profile: No results for input(s): INR, PROTIME in the last 168 hours. Cardiac Enzymes: No results for input(s): CKTOTAL, CKMB, CKMBINDEX, TROPONINI in the last 168 hours. BNP (last 3 results) No results for input(s): PROBNP in the last 8760 hours. HbA1C: No results for input(s): HGBA1C in the last 72 hours. CBG: Recent Labs  Lab 07/23/19 2027 07/24/19 0125  GLUCAP 88 81   Lipid Profile: No results for input(s): CHOL, HDL, LDLCALC, TRIG, CHOLHDL, LDLDIRECT in the last 72 hours. Thyroid Function Tests: No results for input(s): TSH, T4TOTAL, FREET4, T3FREE, THYROIDAB in the last 72 hours. Anemia Panel: No results for input(s): VITAMINB12, FOLATE, FERRITIN, TIBC, IRON, RETICCTPCT in the last 72 hours. Sepsis Labs: No results for input(s): PROCALCITON, LATICACIDVEN in the last 168 hours.  Recent Results (from the past 240 hour(s))  Aerobic/Anaerobic Culture (surgical/deep wound)     Status: None   Collection Time: 07/17/19 10:35 AM   Specimen: Wound; Synovial Fluid  Result Value Ref Range Status   Specimen Description   Final    WOUND LT SACROILIAC ASPIRATION Performed at [facility] [address] [place], [place] [address]    Special Requests   Final    Normal Performed at [facility], [place] [address], [place], [place] [address]    Gram Stain   Final    RARE WBC PRESENT, PREDOMINANTLY PMN NO ORGANISMS SEEN    Culture   Final    No growth aerobically or anaerobically. Performed at [facility], [place] [address] [place],  [address]    Report Status 07/22/2019 FINAL  Final  SARS  CORONAVIRUS 2 (TAT 6-24 HRS) Nasopharyngeal Nasopharyngeal Swab     Status: None   Collection Time: 07/22/19  2:52 PM   Specimen: Nasopharyngeal Swab  Result Value Ref Range Status   SARS Coronavirus 2 NEGATIVE NEGATIVE Final    Comment: (NOTE) SARS-CoV-2 target nucleic acids are NOT DETECTED. The SARS-CoV-2 RNA is generally detectable in upper and lower respiratory specimens during the acute phase of infection. Negative results do not preclude SARS-CoV-2 infection, do not rule out co-infections with other pathogens, and should not be used as the sole basis for treatment or other patient management decisions. Negative results must be combined with clinical observations, patient history, and epidemiological information. The expected result  is Negative. Fact Sheet for Patients: SugarRoll.be Fact Sheet for Healthcare Providers: https://www.woods-mathews.com/ This test is not yet approved or cleared by the [place] FDA and  has been authorized for detection and/or diagnosis of SARS-CoV-2 by FDA under an Emergency Use Authorization (EUA). This EUA will remain  in effect (meaning this test can be used) for the duration of the COVID-19 declaration under Section 56 4(b)(1) of the Act, 21 U.S.C. section 360bbb-3(b)(1), unless the authorization is terminated or revoked sooner. Performed at [facility], [place] [address], [place], [place] [address]   Culture, Urine     Status: None   Collection Time: 07/23/19  8:40 PM   Specimen: Urine, Random  Result Value Ref Range Status   Specimen Description   Final    URINE, RANDOM Performed at [facility] [address], [place], [place] [address]    Special Requests   Final    NONE Performed at [facility], [place] [address], [place], [place] [address]    Culture   Final    NO GROWTH Performed at [facility], [place] [address], [place], [place]  [address]    Report Status 07/24/2019 FINAL  Final     Radiology Studies: No results found.  Scheduled Meds: . allopurinol  150 mg Oral BID  . amLODipine  10 mg Oral Daily  . apixaban  5 mg Oral BID  . metoprolol tartrate  50 mg Oral BID  . mometasone-formoterol  2 puff Inhalation BID  . nystatin   Topical BID  . pantoprazole  40 mg Oral Daily  . polyethylene glycol  17 g Oral Daily  . pravastatin  20 mg Oral Daily  . QUEtiapine  25 mg Oral QHS   Continuous Infusions: . lactated ringers 75 mL/hr at 07/26/19 1139     LOS: 16 days   Marylu Lund, MD [facility] Pager On Amion  If 7PM-7AM, please contact night-coverage 07/26/2019, 3:18 PM

## 2019-07-26 NOTE — Progress Notes (Signed)
Patient does not want to start the IVF ordered until  She talks to the doctor first.

## 2019-07-27 LAB — SARS CORONAVIRUS 2 (TAT 6-24 HRS): SARS Coronavirus 2: NEGATIVE

## 2019-07-27 LAB — COMPREHENSIVE METABOLIC PANEL
ALT: 13 U/L (ref 0–44)
AST: 17 U/L (ref 15–41)
Albumin: 2.9 g/dL — ABNORMAL LOW (ref 3.5–5.0)
Alkaline Phosphatase: 232 U/L — ABNORMAL HIGH (ref 38–126)
Anion gap: 11 (ref 5–15)
BUN: 24 mg/dL — ABNORMAL HIGH (ref 8–23)
CO2: 23 mmol/L (ref 22–32)
Calcium: 9 mg/dL (ref 8.9–10.3)
Chloride: 97 mmol/L — ABNORMAL LOW (ref 98–111)
Creatinine, Ser: 1.03 mg/dL — ABNORMAL HIGH (ref 0.44–1.00)
GFR calc Af Amer: >60 mL/min (ref >60)
GFR calc non Af Amer: 53 mL/min — ABNORMAL LOW (ref >60)
Glucose, Bld: 80 mg/dL (ref 70–99)
Potassium: 4 mmol/L (ref 3.5–5.1)
Sodium: 131 mmol/L — ABNORMAL LOW (ref 135–145)
Total Bilirubin: <0.1 mg/dL — ABNORMAL LOW (ref 0.3–1.2)
Total Protein: 6.8 g/dL (ref 6.5–8.1)

## 2019-07-27 NOTE — TOC Progression Note (Signed)
Transition of Care [facility]) - Progression Note    Patient Details  Name: Erika [name] MRN: [id number] Date of Birth: 23-Sep-1940  [facility] CM/SW Contact  Sharnette Kitamura, Juliann Pulse, RN Phone Number: 07/27/2019, 10:18 AM  Clinical Narrative:   Has auth till 12/23. Await covid results. Has a bed @ [address] MD aware.    Expected Discharge Plan: Skilled Nursing Facility Barriers to Discharge: Other (comment)(awaiting covid.)  Expected Discharge Plan and Services Expected Discharge Plan: [place] In-house Referral: Clinical Social Work Discharge Planning Services: CM Consult Post Acute Care Choice: [place] Living arrangements for the past 2 months: Apartment                                       Social Determinants of Health (SDOH) Interventions    Readmission Risk Interventions No flowsheet data found.

## 2019-07-27 NOTE — Progress Notes (Signed)
PROGRESS NOTE    Erika [name]  HYI:[id number] DOB: 25-Jan-1941 DOA: 07/10/2019 PCP: [name], MD    Brief Narrative:  78 year old African-American female, morbidly obese with BMI of 48 kg/m, and past medical history significant for myeloproliferative neoplasm NOS-didnt see oncology for 7-71month, hypertension, asthma, paroxysmal atrial fibrillation and status post resection of lumbar spine mass.  Patient also has history of chronic lower back pain, with possible radiculopathy.  Patient presented with worsening right-sided lower back pain and the inability to ambulate over 2 days. -In the emergency room she was noted to have a white count of 107 compared to previous level of 61, creatinine of 1.4, uric acid of 14, ESR of 70, CT pelvis nonspecific, question septic arthritis versus sacroiliitis, seen by orthopedics did not feel this was consistent with septic arthritis -Seen by oncology for profound leukocytosis, concern for tumor lysis syndrome -Underwent bone marrow aspiration biopsy 12/7  Assessment & Plan:   Principal Problem:   Myeloproliferative disorder ([facility] Problems:   Obesity, Class III, BMI 40-49.9 (morbid obesity) ([facility]   Essential hypertension   Moderate persistent asthma   AF (paroxysmal atrial fibrillation) (HCC)   AKI (acute kidney injury) ([place]   Thrombocytopenia (H[facility])   Hyperuricemia   Tumor lysis syndrome   Agitation    Severe back pain/inability to ambulate/possible left-sided sacroiliitis versus septic left sacral iliac joint: -Patient has acute on chronic left hip and lower back pain along with radiculopathy -She was treated for paraspinal/lumbar TB in 2002, and also underwent hemilaminectomy of L4-L5 and S1, decompression of tumor and epidural space, which was later noted to be granulomatous disease from TB -Not have constitutional symptoms, fever chills and she has a chronically elevated ESR which could be explained by her progressive myelopathy  proliferative neoplasm.  -Seen by Orthopedics in consultation, infection felt to be less likely -However, given her worsening symptoms, limitations in ambulation and complex history of TB in her LS spine agree with MRI spine and left hip with and without contrast. [facility]  -MRI of spine negative for evidence of infectious process but left SI joint could not have septic arthritis ruled out.  -Patient underwent SI joint aspiration on 12/11, fluid Gram stain with no organism. Culture is NGTD x3 days. Will continue monitoring off Abx.  -She reports feeling better and being able to move her left leg after starting tizanidine which will be continued.  -Continue heating pad, tylenol as needed.  -Continue PT and OT. Planning SNF placement when stable, SW folowing -COVID test from 12/16 neg, repeat COVID testing needed for placement in progress  Severe leukocytosis, myeloproliferative Neoplasm NOS: -WBC 107 on presentation,  -Oncology Dr. KIrene Limbo following -She is s/p bone marrow biopsy, results per Hem/Onc, with no transformation/leukemia. Hydroxyurea held.  -No other hematologic interventions planned as of 12/11 per Heme with f/u planned 1-2 weeks after d/c -WBC currently stable at 89k on most recent check  Elevated uric acid level/tumor lysis syndrome -Uric acid was 14.1 on presentation. Down to 3.4 on most recent check -Calcium, potassium within normal range, kidney function stable -Continued on daily allopurinol, plan to cont on d/c  Paroxysmal atrial fibrillation: -Heart rate is controlled now but was slightly elevated earlier today -Continue metoprolol 12.5 mg p.o. twice daily.  -Continue with Apixaban 5 mg p.o. twice daily as tolerated -No sign of acute blood loss  Morbid obesity: -Needs aggressive lifestyle modification  Possible lower lobe infiltrates as per chest x-ray on presentation: -Repeat CXR with no infiltrate  and patient with no symptoms to indicate pneumonia.  -Continue monitoring off antibiotics.   Acute Kidney injury: -Initially improved with hydration -Pt noted to have decreased PO intake recently. Repeat Cr recently increased to 1.3.  -Improved with IVF hydration  Acute toxic metabolic encephalopathy -Recent finding this admit -Patient alert and oriented x 3, but had displayed paranoia and grandiose ideas -Head CT personally reviewed, neg -Now on seroquel 23m QHS -Per psychiatry recommendations add 12.530mPRN seroquel -Seems improved this AM  Bladder outlet obstruction -New finding this admit -repeated bladder outlet obstruction noted, now with indwelling cath  DVT prophylaxis: Eliquis Code Status: Full Family Communication: Pt in room, family not at bedside Disposition Plan: SNF, when stable  Consultants:   Heme/Onc  Ortho  IR  Procedures:  Bone Marrow biopsy scheduled for 07/13/19 Antimicrobials:   IV vancomycin 12/5-12/6  IV ceftriaxone  07/11/2019-07/12/2019.  IV cefepime discontinued on 12/6  Antimicrobials: Anti-infectives (From admission, onward)   Start     Dose/Rate Route Frequency Ordered Stop   07/12/19 1000  vancomycin (VANCOCIN) 1,500 mg in sodium chloride 0.9 % 500 mL IVPB  Status:  Discontinued     1,500 mg 250 mL/hr over 120 Minutes Intravenous Every 36 hours 07/11/19 0617 07/12/19 1537   07/11/19 1800  ceFEPIme (MAXIPIME) 2 g in sodium chloride 0.9 % 100 mL IVPB  Status:  Discontinued     2 g 200 mL/hr over 30 Minutes Intravenous Every 12 hours 07/11/19 0615 07/11/19 1445   07/11/19 1600  cefTRIAXone (ROCEPHIN) 2 g in sodium chloride 0.9 % 100 mL IVPB  Status:  Discontinued     2 g 200 mL/hr over 30 Minutes Intravenous Every 24 hours 07/11/19 1445 07/12/19 1537   07/10/19 2245  vancomycin (VANCOCIN) 2,000 mg in sodium chloride 0.9 % 500 mL IVPB     2,000 mg 250 mL/hr over 120 Minutes Intravenous  Once 07/10/19 2234 07/11/19 0234   07/10/19  2245  ceFEPIme (MAXIPIME) 2 g in sodium chloride 0.9 % 100 mL IVPB  Status:  Discontinued     2 g 200 mL/hr over 30 Minutes Intravenous Every 8 hours 07/10/19 2234 07/11/19 0615      Subjective: No complaints. Reports feeling well  Objective: Vitals:   07/26/19 2102 07/27/19 0403 07/27/19 0829 07/27/19 1254  BP: 129/65 110/65  109/61  Pulse: 92 79  84  Resp: _0 Temp: 98 F (36.7 C) 98.2 F (36.8 C)  97.9 F (36.6 C)  TempSrc: Oral Oral  Oral  SpO2: 94% 97% 95% 99%  Weight:      Height:        Intake/Output Summary (Last 24 hours) at 07/27/2019 1547 Last data filed at 07/27/2019 1255 Gross per 24 hour  Intake 2469.79 ml  Output 1150 ml  Net 1319.79 ml   Filed Weights   07/10/19 1639 07/11/19 1119 07/13/19 1058  Weight: 134.7 kg 135.1 kg 136 kg    Examination: General exam: Awake, laying in bed, in nad Respiratory system: Normal respiratory effort, no wheezing  Data Reviewed: I have personally reviewed following labs and imaging studies  CBC: Recent Labs  Lab 07/22/19 0536 07/23/19 1716  WBC 89.1* 89.4*  NEUTROABS 77.0*  --   HGB 8.8* 10.1*  HCT 27.9* 31.9*  MCV 98.6 97.0  PLT 263 [id number] Basic Metabolic Panel: Recent Labs  Lab 07/22/19 0536 07/23/19  1716 07/24/19 0455 07/26/19 0522 07/27/19 0447  NA 135 135 137 134* 131*  K 4.4 4.0 3.8 4.2 4.0  CL 98 97* 99 96* 97*  CO2 _0 GLUCOSE 82 100* 84 77 80  BUN _1 32* 24*  CREATININE 1.03* 1.01* 0.87 1.30* 1.03*  CALCIUM 9.5 9.8 9.6 9.3 9.0   GFR: Estimated Creatinine Clearance: 66 mL/min (A) (by C-G formula based on SCr of 1.03 mg/dL (H)). Liver Function Tests: Recent Labs  Lab 07/23/19 1716 07/24/19 0455 07/26/19 0522 07/27/19 0447  AST _2 ALT _3 ALKPHOS 288* 267* 242* 232*  BILITOT 0.5 0.5 0.3 <0.1*  PROT 8.1 8.0 7.6 6.8  ALBUMIN 3.2* 3.3* 3.2* 2.9*   No results for input(s): LIPASE, AMYLASE in the last 168 hours. Recent Labs  Lab  07/23/19 1716  AMMONIA 17   Coagulation Profile: No results for input(s): INR, PROTIME in the last 168 hours. Cardiac Enzymes: No results for input(s): CKTOTAL, CKMB, CKMBINDEX, TROPONINI in the last 168 hours. BNP (last 3 results) No results for input(s): PROBNP in the last 8760 hours. HbA1C: No results for input(s): HGBA1C in the last 72 hours. CBG: Recent Labs  Lab 07/23/19 2027 07/24/19 0125  GLUCAP 88 81   Lipid Profile: No results for input(s): CHOL, HDL, LDLCALC, TRIG, CHOLHDL, LDLDIRECT in the last 72 hours. Thyroid Function Tests: No results for input(s): TSH, T4TOTAL, FREET4, T3FREE, THYROIDAB in the last 72 hours. Anemia Panel: No results for input(s): VITAMINB12, FOLATE, FERRITIN, TIBC, IRON, RETICCTPCT in the last 72 hours. Sepsis Labs: No results for input(s): PROCALCITON, LATICACIDVEN in the last 168 hours.  Recent Results (from the past 240 hour(s))  SARS CORONAVIRUS 2 (TAT 6-24 HRS) Nasopharyngeal Nasopharyngeal Swab     Status: None   Collection Time: 07/22/19  2:52 PM   Specimen: Nasopharyngeal Swab  Result Value Ref Range Status   SARS Coronavirus 2 NEGATIVE NEGATIVE Final    Comment: (NOTE) SARS-CoV-2 target nucleic acids are NOT DETECTED. The SARS-CoV-2 RNA is generally detectable in upper and lower respiratory specimens during the acute phase of infection. Negative results do not preclude SARS-CoV-2 infection, do not rule out co-infections with other pathogens, and should not be used as the sole basis for treatment or other patient management decisions. Negative results must be combined with clinical observations, patient history, and epidemiological information. The expected result is Negative. Fact Sheet for Patients: SugarRoll.be Fact Sheet for Healthcare Providers: https://www.woods-mathews.com/ This test is not yet approved or cleared by the [place] FDA and  has been authorized for detection  and/or diagnosis of SARS-CoV-2 by FDA under an Emergency Use Authorization (EUA). This EUA will remain  in effect (meaning this test can be used) for the duration of the COVID-19 declaration under Section 56 4(b)(1) of the Act, 21 U.S.C. section 360bbb-3(b)(1), unless the authorization is terminated or revoked sooner. Performed at [facility], [place] [address] [place], [place] [address]   Culture, Urine     Status: None   Collection Time: 07/23/19  8:40 PM   Specimen: Urine, Random  Result Value Ref Range Status   Specimen Description   Final    URINE, RANDOM Performed at [place] [address], [place], [place] [address]    Special Requests   Final    NONE Performed at [facility], [place] [address] [place], [place] [address]    Culture  Final    NO GROWTH Performed at [facility], [place] [address], [place], [place] [address]    Report Status 07/24/2019 FINAL  Final     Radiology Studies: No results found.  Scheduled Meds: . allopurinol  150 mg Oral BID  . amLODipine  10 mg Oral Daily  . apixaban  5 mg Oral BID  . metoprolol tartrate  50 mg Oral BID  . mometasone-formoterol  2 puff Inhalation BID  . nystatin   Topical BID  . pantoprazole  40 mg Oral Daily  . polyethylene glycol  17 g Oral Daily  . pravastatin  20 mg Oral Daily  . QUEtiapine  25 mg Oral QHS   Continuous Infusions:    LOS: 17 days   Marylu Lund, MD [facility] Pager On Amion  If 7PM-7AM, please contact night-coverage 07/27/2019, 3:47 PM

## 2019-07-28 ENCOUNTER — Telehealth: Payer: Self-pay | Admitting: Hematology

## 2019-07-28 DIAGNOSIS — N3000 Acute cystitis without hematuria: Secondary | ICD-10-CM | POA: Diagnosis not present

## 2019-07-28 DIAGNOSIS — M549 Dorsalgia, unspecified: Secondary | ICD-10-CM | POA: Diagnosis not present

## 2019-07-28 DIAGNOSIS — Z20828 Contact with and (suspected) exposure to other viral communicable diseases: Secondary | ICD-10-CM | POA: Diagnosis not present

## 2019-07-28 DIAGNOSIS — G9341 Metabolic encephalopathy: Secondary | ICD-10-CM | POA: Diagnosis not present

## 2019-07-28 DIAGNOSIS — R442 Other hallucinations: Secondary | ICD-10-CM | POA: Diagnosis not present

## 2019-07-28 DIAGNOSIS — Z03818 Encounter for observation for suspected exposure to other biological agents ruled out: Secondary | ICD-10-CM | POA: Diagnosis not present

## 2019-07-28 DIAGNOSIS — R4182 Altered mental status, unspecified: Secondary | ICD-10-CM | POA: Diagnosis not present

## 2019-07-28 DIAGNOSIS — K5903 Drug induced constipation: Secondary | ICD-10-CM | POA: Diagnosis not present

## 2019-07-28 DIAGNOSIS — Z888 Allergy status to other drugs, medicaments and biological substances status: Secondary | ICD-10-CM | POA: Diagnosis not present

## 2019-07-28 DIAGNOSIS — Z833 Family history of diabetes mellitus: Secondary | ICD-10-CM | POA: Diagnosis not present

## 2019-07-28 DIAGNOSIS — R404 Transient alteration of awareness: Secondary | ICD-10-CM | POA: Diagnosis not present

## 2019-07-28 DIAGNOSIS — J45909 Unspecified asthma, uncomplicated: Secondary | ICD-10-CM | POA: Diagnosis not present

## 2019-07-28 DIAGNOSIS — D471 Chronic myeloproliferative disease: Secondary | ICD-10-CM | POA: Diagnosis not present

## 2019-07-28 DIAGNOSIS — B961 Klebsiella pneumoniae [K. pneumoniae] as the cause of diseases classified elsewhere: Secondary | ICD-10-CM | POA: Diagnosis not present

## 2019-07-28 DIAGNOSIS — M6281 Muscle weakness (generalized): Secondary | ICD-10-CM | POA: Diagnosis not present

## 2019-07-28 DIAGNOSIS — R410 Disorientation, unspecified: Secondary | ICD-10-CM | POA: Diagnosis not present

## 2019-07-28 DIAGNOSIS — F22 Delusional disorders: Secondary | ICD-10-CM | POA: Diagnosis not present

## 2019-07-28 DIAGNOSIS — R456 Violent behavior: Secondary | ICD-10-CM | POA: Diagnosis not present

## 2019-07-28 DIAGNOSIS — R451 Restlessness and agitation: Secondary | ICD-10-CM | POA: Diagnosis not present

## 2019-07-28 DIAGNOSIS — Z7401 Bed confinement status: Secondary | ICD-10-CM | POA: Diagnosis not present

## 2019-07-28 DIAGNOSIS — R41841 Cognitive communication deficit: Secondary | ICD-10-CM | POA: Diagnosis not present

## 2019-07-28 DIAGNOSIS — M2550 Pain in unspecified joint: Secondary | ICD-10-CM | POA: Diagnosis not present

## 2019-07-28 DIAGNOSIS — I480 Paroxysmal atrial fibrillation: Secondary | ICD-10-CM | POA: Diagnosis not present

## 2019-07-28 DIAGNOSIS — Z8249 Family history of ischemic heart disease and other diseases of the circulatory system: Secondary | ICD-10-CM | POA: Diagnosis not present

## 2019-07-28 DIAGNOSIS — Z823 Family history of stroke: Secondary | ICD-10-CM | POA: Diagnosis not present

## 2019-07-28 DIAGNOSIS — Z79899 Other long term (current) drug therapy: Secondary | ICD-10-CM | POA: Diagnosis not present

## 2019-07-28 DIAGNOSIS — N390 Urinary tract infection, site not specified: Secondary | ICD-10-CM | POA: Diagnosis not present

## 2019-07-28 DIAGNOSIS — R278 Other lack of coordination: Secondary | ICD-10-CM | POA: Diagnosis not present

## 2019-07-28 DIAGNOSIS — Z6841 Body Mass Index (BMI) 40.0 and over, adult: Secondary | ICD-10-CM | POA: Diagnosis not present

## 2019-07-28 DIAGNOSIS — Z7901 Long term (current) use of anticoagulants: Secondary | ICD-10-CM | POA: Diagnosis not present

## 2019-07-28 DIAGNOSIS — R2689 Other abnormalities of gait and mobility: Secondary | ICD-10-CM | POA: Diagnosis not present

## 2019-07-28 DIAGNOSIS — N179 Acute kidney failure, unspecified: Secondary | ICD-10-CM | POA: Diagnosis not present

## 2019-07-28 DIAGNOSIS — I10 Essential (primary) hypertension: Secondary | ICD-10-CM | POA: Diagnosis not present

## 2019-07-28 DIAGNOSIS — K219 Gastro-esophageal reflux disease without esophagitis: Secondary | ICD-10-CM | POA: Diagnosis not present

## 2019-07-28 DIAGNOSIS — R2681 Unsteadiness on feet: Secondary | ICD-10-CM | POA: Diagnosis not present

## 2019-07-28 DIAGNOSIS — E860 Dehydration: Secondary | ICD-10-CM | POA: Diagnosis not present

## 2019-07-28 DIAGNOSIS — M5137 Other intervertebral disc degeneration, lumbosacral region: Secondary | ICD-10-CM | POA: Diagnosis not present

## 2019-07-28 DIAGNOSIS — Z832 Family history of diseases of the blood and blood-forming organs and certain disorders involving the immune mechanism: Secondary | ICD-10-CM | POA: Diagnosis not present

## 2019-07-28 DIAGNOSIS — G8929 Other chronic pain: Secondary | ICD-10-CM | POA: Diagnosis not present

## 2019-07-28 DIAGNOSIS — Z7982 Long term (current) use of aspirin: Secondary | ICD-10-CM | POA: Diagnosis not present

## 2019-07-28 MED ORDER — QUETIAPINE FUMARATE 25 MG PO TABS: 25.0000 mg | ORAL_TABLET | Freq: Every day | ORAL | 0 refills | Status: DC

## 2019-07-28 MED ORDER — TIZANIDINE HCL 4 MG PO TABS: 4.0000 mg | ORAL_TABLET | Freq: Three times a day (TID) | ORAL | 0 refills | Status: DC | PRN

## 2019-07-28 MED ORDER — QUETIAPINE FUMARATE 25 MG PO TABS: 12.5000 mg | ORAL_TABLET | Freq: Two times a day (BID) | ORAL | 0 refills | Status: DC | PRN

## 2019-07-28 MED ORDER — ALLOPURINOL 300 MG PO TABS: 150.0000 mg | ORAL_TABLET | Freq: Two times a day (BID) | ORAL | 0 refills | Status: DC

## 2019-07-28 MED ORDER — NYSTATIN 100000 UNIT/GM EX POWD: Freq: Two times a day (BID) | CUTANEOUS | 0 refills | Status: DC

## 2019-07-28 MED ORDER — METOPROLOL TARTRATE 50 MG PO TABS: 50.0000 mg | ORAL_TABLET | Freq: Two times a day (BID) | ORAL | 0 refills | Status: DC

## 2019-07-28 NOTE — Discharge Summary (Signed)
Physician Discharge Summary  Erika [name] JFH:[id number] DOB: Dec 10, 1940 DOA: 07/10/2019  PCP: [name], MD  Admit date: 07/10/2019 Discharge date: 07/28/2019  Admitted From: Home Disposition:  SNF  Recommendations for Outpatient Follow-up:  1. Follow up with PCP in 1-2 weeks 2. Follow up with Hematology as scheduled 3. Routine foley care, please wean catheter off as tolerated  Discharge Condition:Stable CODE STATUS:Full Diet recommendation: Regular   Brief/Interim Summary: 78 year old African-American female, morbidly obese with BMI of 48 kg/m, and past medical history significant for myeloproliferative neoplasm NOS-didnt see oncology for 7-67month, hypertension, asthma, paroxysmal atrial fibrillation and status post resection of lumbar spine mass. Patient also has history of chronic lower back pain, with possible radiculopathy. Patient presented with worsening right-sided lower back pain and the inability to ambulate over 2 days. -In the emergency room she was noted to have a white count of 107 compared to previous level of 61, creatinine of 1.4, uric acid of 14, ESR of 70, CT pelvis nonspecific, question septic arthritis versus sacroiliitis, seen by orthopedics did not feel this was consistent with septic arthritis -Seen by oncology for profound leukocytosis, concern for tumor lysis syndrome -Underwent bone marrow aspiration biopsy 12/7  Discharge Diagnoses:  Principal Problem:   Myeloproliferative disorder ([facility] Active Problems:   Obesity, Class III, BMI 40-49.9 (morbid obesity) ([facility]   Essential hypertension   Moderate persistent asthma   AF (paroxysmal atrial fibrillation) (HCC)   AKI (acute kidney injury) ([facility]   Thrombocytopenia (HCC)   Hyperuricemia   Tumor lysis syndrome   Agitation   Severe back pain/inability to ambulate/possible left-sided sacroiliitis versus septic left sacral iliac joint: -Patient has acute on chronic left hip and lower back pain  along with radiculopathy -She was treated for paraspinal/lumbar TB in 2002, and also underwent hemilaminectomy of L4-L5 and S1, decompression of tumor and epidural space, which 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  -Seen by Orthopedics in consultation, infection felt to be less likely -However, given her worsening symptoms, limitations in ambulation and complex history of TB in her LS spine agree with MRI spine and left hip with and without contrast. [facility]  -MRI of spine negative for evidence of infectious process but left SI joint could not have septic arthritis ruled out.  -Patient underwent SI joint aspiration on 12/11, fluid Gram stain with no organism. Culture is NGTD x3 days. Will continue monitoring off Abx.  -She reports feeling better and being able to move her left leg after starting tizanidine which will be continued.  -Continue heating pad, tylenol as needed.  -Continue PT and OT. Planning SNF placement when stable, SW folowing -COVID test from 12/16 neg, repeat COVID testing neg  Severe leukocytosis, myeloproliferative Neoplasm NOS: -WBC 107 on presentation,  -Oncology Dr. KIrene Limbo following -She is s/p bone marrow biopsy, results per Hem/Onc, with no transformation/leukemia. Hydroxyurea held.  -No other hematologic interventions planned as of 12/11 per Heme with f/u planned 1-2 weeks after d/c -WBC currently stable at 89k on most recent check. Hematology aware  Elevated uric acid level/tumor lysis syndrome -Uric acid was 14.1 on presentation. Down to 3.4 on most recent check -Calcium, potassium within normal range, kidney function stable -Continued on daily allopurinol, plan to cont on d/c  Paroxysmal atrial  fibrillation: -Heart rate is  controlled now but was slightly elevated earlier today -Continue metoprolol 50 mg p.o. twice daily.  -Continue with Apixaban 5 mg p.o. twice daily as tolerated -No sign of acute blood loss  Morbid obesity: -Needs aggressive lifestyle modification  Possible lower lobe infiltrates as per chest x-ray on presentation: -Repeat CXR with no infiltrate and patient with no symptoms to indicate pneumonia.  -Continue monitoring off antibiotics.   Acute Kidney injury: -Initially improved with hydration -Pt noted to have decreased PO intake recently. Repeat Cr recently increased to 1.3.  -Improved with IVF hydration  Acute toxic metabolic encephalopathy -Recent finding this admit -Patient alert and oriented x 3, but had displayed paranoia and grandiose ideas -Head CT personally reviewed, neg -Now on seroquel 72m QHS -Per psychiatry recommendations add 12.526mPRN seroquel -Seems improved  Bladder outlet obstruction -New finding this admit -repeated bladder outlet obstruction noted, now with indwelling cath  Discharge Instructions   Allergies as of 07/28/2019      Reactions   Prednisone Other (See Comments)   REACTION: Psychosis, talking out of head, insomnia      Medication List    STOP taking these medications   fexofenadine 180 MG tablet Commonly known as: Allegra Allergy   lisinopril 5 MG tablet Commonly known as: ZESTRIL   potassium chloride SA 20 MEQ tablet Commonly known as: KLOR-CON   traMADol 50 MG tablet Commonly known as: ULTRAM     TAKE these medications   Advair Diskus 500-50 MCG/DOSE Aepb Generic drug: Fluticasone-Salmeterol INHALE 1 PUFF BY MOUTH TWICE DAILY What changed:   how much to take  how to take this  when to take this  additional instructions   albuterol 108 (90 Base) MCG/ACT inhaler Commonly known as: VENTOLIN HFA INHALE 1 TO 2 PUFFS BY MOUTH EVERY 6 HOURS AS NEEDED What changed: See the new instructions.   allopurinol 300 MG  tablet Commonly known as: ZYLOPRIM Take 0.5 tablets (150 mg total) by mouth 2 (two) times daily.   amLODipine 10 MG tablet Commonly known as: NORVASC Take 1 tablet (10 mg total) by mouth daily.   apixaban 5 MG Tabs tablet Commonly known as: Eliquis Take 1 tablet (5 mg total) by mouth 2 (two) times daily.   aspirin EC 81 MG tablet Take 81 mg by mouth daily.   diphenhydrAMINE 25 MG tablet Commonly known as: BENADRYL Take 25 mg by mouth as needed. Once a day prn   fluticasone 50 MCG/ACT nasal spray Commonly known as: FLONASE SHAKE LIQUID AND USE 1 SPRAY IN EACH NOSTRIL DAILY What changed: See the new instructions.   furosemide 20 MG tablet Commonly known as: LASIX TAKE 1 TABLET(20 MG) BY MOUTH DAILY AS NEEDED FOR EDEMA What changed: See the new instructions.   metoprolol tartrate 50 MG tablet Commonly known as: LOPRESSOR Take 1 tablet (50 mg total) by mouth 2 (two) times daily. What changed:   medication strength  See the new instructions.   nystatin powder Commonly known as: MYCOSTATIN/NYSTOP Apply topically 2 (two) times daily.   omeprazole 20 MG capsule Commonly known as: PRILOSEC TAKE 1 CAPSULE BY MOUTH EVERY DAY What changed: how much to take   pravastatin 20 MG tablet Commonly known as: PRAVACHOL Take 1 tablet (20 mg total) by mouth daily.   QUEtiapine 25 MG tablet Commonly known as: SEROQUEL Take 0.5 tablets (12.5 mg total) by mouth 2 (two) times daily as needed (agitation).   QUEtiapine 25 MG tablet Commonly known as: SEROQUEL  Take 1 tablet (25 mg total) by mouth at bedtime.   sodium chloride 0.65 % Soln nasal spray Commonly known as: OCEAN Place 1 spray into both nostrils as needed for congestion.   tiZANidine 4 MG tablet Commonly known as: ZANAFLEX Take 1 tablet (4 mg total) by mouth every 8 (eight) hours as needed for muscle spasms.       Contact information for follow-up providers    Brunetta Genera, MD. Schedule an appointment as  soon as possible for a visit.   Specialties: Hematology, Oncology Contact information: [place] [address] [phone]            Contact information for after-discharge care    Destination    [facility]erred [facility] .   Service: Skilled Nursing Contact information: [place] [address] [phone]                 Allergies  Allergen Reactions  . Prednisone Other (See Comments)    REACTION: Psychosis, talking out of head, insomnia    Consultations:  Hematology  Ortho  IR  Procedures/Studies: DG Chest 2 View  Result Date: 07/12/2019 CLINICAL DATA:  Patient with abnormalities on previous chest x-ray. EXAM: CHEST - 2 VIEW COMPARISON:  Chest x-ray 07/10/2019 FINDINGS: Marked cardiac enlargement similar to prior study accounting for decreased lung volume seen on the current exam. Hilar fullness is accentuated also by low depth of expansion. Basilar opacities are noted bilaterally. Interstitial markings are increased with pulmonary vascular congestion. In the right lung base at the site previously obscured by cardiac lead is a nodular density with rounded morphology. This measures approximately 2.3 cm. IMPRESSION: 1. Stable marked cardiac enlargement with pulmonary vascular congestion. Question mild edema with diminishing lung volumes, consider correlation with signs of heart failure or volume overload. 2. Basilar opacities may represent atelectasis or infection. 3. Area of density with rounded morphology in the right lower lobe along with right hilar fullness. Consider CT of the chest when the patient is able for further assessment to exclude nodule with hilar adenopathy. Electronically Signed   By: Zetta Bills M.D.   On: 07/12/2019 17:56   Chest Xray 2 View  Result Date: 07/10/2019 CLINICAL DATA:  Wheezing EXAM: CHEST - 2 VIEW COMPARISON:  05/29/2017 FINDINGS: Normal cardiomediastinal silhouette without  pneumothorax or pleural effusion. Patchy airspace opacities over the posterior lung bases on lateral view. IMPRESSION: 1. Suspect posterior lower lobe infiltrates on the lateral view. 2. Mild cardiomegaly Electronically Signed   By: Donavan Foil M.D.   On: 07/10/2019 22:53   CT HEAD WO CONTRAST  Result Date: 07/23/2019 CLINICAL DATA:  Encephalopathy. Cerebral hemorrhage. Aneurysm suspected. EXAM: CT HEAD WITHOUT CONTRAST TECHNIQUE: Contiguous axial images were obtained from the base of the skull through the vertex without intravenous contrast. COMPARISON:  02/17/2008 FINDINGS: Brain: Mild generalized atrophy. Mild small vessel change of the hemispheric white matter. No sign of acute infarction, mass lesion, hemorrhage, hydrocephalus or extra-axial collection. Vascular: There is atherosclerotic calcification of the major vessels at the base of the brain. Skull: Negative Sinuses/Orbits: Clear/normal Other: None IMPRESSION: No acute or reversible finding. Mild age related volume loss. Minimal small vessel change of the hemispheric white matter. Electronically Signed   By: Nelson Chimes M.D.   On: 07/23/2019 17:10   CT PELVIS WO CONTRAST  Result Date: 07/10/2019 CLINICAL DATA:  Hip pain, chronic possible lytic lesion left sacral ala. EXAM: CT PELVIS WITHOUT CONTRAST TECHNIQUE: Multidetector CT  imaging of the pelvis was performed following the standard protocol without intravenous contrast. COMPARISON:  Plain film study of the same date., bone biopsy images of 06/24/2017. FINDINGS: Urinary Tract:  Choose 1 Bowel: No signs of acute bowel process within visualized portions of the bowel. Vascular/Lymphatic: Not well evaluated given lack of intravenous contrast. Signs of calcific atherosclerotic change throughout the distal abdominal aorta and the iliac vasculature. Reproductive:  Calcified fibroid in the uterine fundus. Other:  No signs of ascites. Musculoskeletal: Signs of bony sclerosis about the left sacroiliac  joint, now associated with more bony erosion and indistinct margins than on the previous exam. Widening of the joint since the prior exam. Subtle stranding along the anterior margin of the joint becoming more pronounced inferiorly. The hips are located bilaterally. IMPRESSION: 1. Signs of bony sclerosis about the left sacroiliac joint, now associated with more bony erosion and indistinct margins than on the previous exam. Findings are concerning for septic arthritis versus active sacroiliitis. No signs of fracture. Small cortical 2. Signs of calcific atherosclerotic change throughout the distal abdominal aorta and iliac vasculature. 3. Calcified fibroid in the uterine fundus. 4. Aortic atherosclerosis. Aortic Atherosclerosis (ICD10-I70.0). Electronically Signed   By: Zetta Bills M.D.   On: 07/10/2019 19:02   MR Lumbar Spine W Wo Contrast  Result Date: 07/15/2019 CLINICAL DATA:  Low back pain with left leg weakness. Possible myeloproliferative disorder. EXAM: MRI LUMBAR SPINE WITHOUT AND WITH CONTRAST TECHNIQUE: Multiplanar and multiecho pulse sequences of the lumbar spine were obtained without and with intravenous contrast. CONTRAST:  56m GADAVIST GADOBUTROL 1 MMOL/ML IV SOLN COMPARISON:  None. FINDINGS: Segmentation:  Standard. Alignment:  Physiologic. Vertebrae:  No fracture, evidence of discitis, or bone lesion. Conus medullaris and cauda equina: Conus extends to the L2 level. Conus and cauda equina appear normal. Paraspinal and other soft tissues: Negative Disc levels: Visualized lower thoracic disc spaces are normal. T12-L1: Normal disc space and facets. No spinal canal or neuroforaminal stenosis. L1-L2: Normal disc space and facets. No spinal canal or neuroforaminal stenosis. L2-L3: Normal disc space and facets. No spinal canal or neuroforaminal stenosis. L3-L4: Small disc bulge with mild spinal canal narrowing. No neural foraminal stenosis. L4-L5: Remote left laminectomy. Mild disc bulge. Mild  bilateral foraminal narrowing, unchanged. No spinal canal stenosis. L5-S1: Remote left laminectomy. No spinal canal or neural foraminal stenosis. Unchanged. Visualized sacrum: Normal. IMPRESSION: 1. Unchanged examination with mild bilateral L4-L5 neural foraminal stenosis. 2. No spinal canal or neural foraminal stenosis. 3. Remote left laminectomy at L4-L5 and L5-S1. Electronically Signed   By: KUlyses JarredM.D.   On: 07/15/2019 22:26   MR HIP LEFT W WO CONTRAST  Result Date: 07/16/2019 CLINICAL DATA:  Left hip pain.  Pain and weakness. EXAM: MRI OF THE LEFT HIP WITHOUT AND WITH CONTRAST TECHNIQUE: Multiplanar, multisequence MR imaging was performed both before and after administration of intravenous contrast. CONTRAST:  110mGADAVIST GADOBUTROL 1 MMOL/ML IV SOLN COMPARISON:  None. FINDINGS: Bones: No hip fracture, dislocation or avascular necrosis. No periosteal reaction or bone destruction. No aggressive osseous lesion. Joint space narrowing and tiny marginal osteophytes of the right SI joint consistent with mild osteoarthritis. Small right SI joint effusion. Moderate left SI joint effusion with subchondral marrow edema and sclerosis in as well as articular surface irregularity and mild adjacent soft tissue edema. Post biopsy changes in the Degenerative disc disease with disc height loss at L4-5 and L5-S1. Articular cartilage and labrum Articular cartilage:  No chondral defect. Labrum: Grossly intact,  but evaluation is limited by lack of intraarticular fluid. Joint or bursal effusion Joint effusion: Small bilateral hip joint effusion. Moderate left SI joint effusion. Mild right SI joint effusion. Bursae: Moderate amount of fluid in the greater trochanteric bursa bilaterally. Muscles and tendons Flexors: Normal. Extensors: Normal. Abductors: Normal. Adductors: Normal. Gluteals: Normal. Hamstrings: Partial-thickness tear of the left hamstring origin. Other findings Miscellaneous: No pelvic free fluid. No fluid  collection or hematoma. No inguinal lymphadenopathy. No inguinal hernia. Small 10 mm uterine fibroid is noted. IMPRESSION: 1. Moderate left SI joint effusion with subchondral marrow edema and sclerosis in as well as articular surface irregularity and mild adjacent soft tissue edema. Given the asymmetry and additional findings this appearance can be seen with septic arthritis. Asymmetric bilateral sacroiliitis can be seen with osteoarthritis, reactive arthritis, crystalline arthropathy, but given the overall appearance septic arthritis should be excluded. 2. 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 3. Bilateral greater trochanteric bursitis. Electronically Signed   By: Kathreen Devoid   On: 07/16/2019 08:21   IR Fluoro Guide Ndl Plmt / BX  Result Date: 07/17/2019 CLINICAL DATA:  Left hip pain. MR suggests left sacroiliitis, possibly septic arthritis PROCEDURE: LEFT SACROILIAC JOINT ASPIRATION UNDER FLUOROSCOPY TECHNIQUE: The procedure, risks (including but not limited to bleeding, infection, organ damage ), benefits, and alternatives were explained to the patient. Questions regarding the procedure were encouraged and answered. The patient understands and consents to the procedure. patient placed prone on the fluoroscopy table. Operator donned sterile gloves and mask. A posterior approach was taken using a curved 20 gauge spinal needle. A scant amount of bloody fluid less than 1 mL could be aspirated from the joint despite vigorous aspiration and changes in needle position. The sample was sent for Gram stain and culture. The procedure was well-tolerated. FLUOROSCOPY TIME:  0.6 minute; 612 uGym2 DAP IMPRESSION: Technically successful left sacro-iliac joint aspiration under fluoroscopy Electronically Signed   By: Lucrezia Europe M.D.   On: 07/17/2019 13:46   CT BIOPSY  Result Date: 07/13/2019 INDICATION: History of MPN and CHEK 2 deletion now with progression of new anemia, thrombocytopenia and leukocytosis. Please  perform CT-guided bone marrow biopsy for tissue diagnostic purposes. Note, questioned left-sided sacroiliitis with identified on recent pelvic CT and as such the decision was made to proceed with bone marrow biopsy of the right iliac crest. EXAM: CT-GUIDED BONE MARROW BIOPSY AND ASPIRATION MEDICATIONS: None ANESTHESIA/SEDATION: Fentanyl 150 mcg IV; Versed 3 mg IV Sedation Time: 10 Minutes; The patient was continuously monitored during the procedure by the interventional radiology nurse under my direct supervision. COMPLICATIONS: None immediate. PROCEDURE: Informed consent was obtained from the patient following an explanation of the procedure, risks, benefits and alternatives. The patient understands, agrees and consents for the procedure. All questions were addressed. A time out was performed prior to the initiation of the procedure. The patient was positioned prone and non-contrast localization CT was performed of the pelvis to demonstrate the iliac marrow spaces. The operative site was prepped and draped in the usual sterile fashion. Under sterile conditions and local anesthesia, a 22 gauge spinal needle was utilized for procedural planning. Next, an 11 gauge coaxial bone biopsy needle was advanced into the right iliac marrow space. Needle position was confirmed with CT imaging. Initially, bone marrow aspiration was performed. Next, a bone marrow biopsy was obtained with the 11 gauge outer bone marrow device. Samples were prepared with the cytotechnologist and deemed adequate. The needle was removed intact. Hemostasis was obtained with compression  and a dressing was placed. The patient tolerated the procedure well without immediate post procedural complication. IMPRESSION: Successful CT guided right iliac bone marrow aspiration and core biopsy. Electronically Signed   By: Sandi Mariscal M.D.   On: 07/13/2019 11:06   DEXAScan  Result Date: 06/30/2019 EXAM: DUAL X-RAY ABSORPTIOMETRY (DXA) FOR BONE MINERAL DENSITY  IMPRESSION: Referring Physician:  [name] Your patient completed a BMD test using Lunar IDXA DXA system ( analysis version: 16 ) manufactured by [employer]. Technologist: AW PATIENT: Name: Kourtnie, Sachs Patient ID: [id number] Birth Date: 1941-01-26 Height:     65.0 in. Sex: Female Measured: 06/30/2019 Weight:     298.0 lbs. Indications: Advanced Age, Estrogen Deficient, Postmenopausal Fractures: None Treatments: None ASSESSMENT: The BMD measured at Femur Total Left is 0.848 g/cm2 with a T-score of -1.3. This patient is considered OSTEOPENIC according to [facility]) criteria. The scan quality is good. Site Region Measured Date Measured Age YA T-score BMD Significant CHANGE AP Spine  L1-L4      8/30[age]         -1.2    1.062 g/cm2 DualFemur Total Left 8/30/2019    [age]         -1.3    0.848 g/cm2 DualFemur Total Mean 8/30/2019    [age]         -1.2    0.852 g/cm2 World Health Organization [facility]) criteria for post-menopausal, Caucasian Women: Normal       T-score at or above -1 SD Osteopenia   T-score between -1 and -2.5 SD Osteoporosis T-score at or below -2.5 SD RECOMMENDATION: 1. All patients should optimize calcium and vitamin D intake. 2. Consider FDA approved medical therapies in postmenopausal women and men aged 67 years and older, based on the following: a. A hip or vertebral (clinical or morphometric) fracture b. T- score < or = -2.5 at the femoral neck or spine after appropriate evaluation to exclude secondary causes c. Low bone mass (T-score between -1.0 and -2.5 at the femoral neck or spine) and a 10 year probability of a hip fracture > or = 3% or a 10 year probability of a major osteoporosis-related fracture > or = 20% based on the [place]-adapted WHO algorithm d. Clinician judgment and/or patient preferences may indicate treatment for people with 10-year fracture probabilities above or below these levels FOLLOW-UP: Patients with diagnosis of osteoporosis or at high  risk for fracture should have regular bone mineral density tests. For patients eligible for Medicare, routine testing is allowed once every 2 years. The testing frequency can be increased to one year for patients who have rapidly progressing disease, those who are receiving or discontinuing medical therapy to restore bone mass, or have additional risk factors. I have reviewed this report and agree with the above findings. Mark A. Thornton Papas, M.D. [facility] FRAX* 10-year Probability of Fracture Based on femoral neck BMD: DualFemur (Right) Major Osteoporotic Fracture: 3.9% Hip Fracture:                0.6% Population:                  [place] (Black) Risk Factors:                None *FRAX is a Materials engineer of the [employer] for Metabolic Bone Disease, a World Pharmacologist (WHO) [facility]. ASSESSMENT: The probability of a major osteoporotic fracture is 3.9 % within the  next ten years. The probability of a hip fracture is 0.6 % within the next ten years. I have reviewed this report and agree with the above findings. Mark A. Thornton Papas, M.D. [facility] Electronically Signed   By: Lavonia Dana M.D.   On: 06/30/2019 09:33   DG CHEST PORT 1 VIEW  Result Date: 07/23/2019 CLINICAL DATA:  Pneumonia EXAM: PORTABLE CHEST 1 VIEW COMPARISON:  07/14/2019 FINDINGS: Cardiomegaly. Hilar prominence noted which could be related to vascular congestion, but cannot exclude adenopathy. Particularly in the right hilum. No confluent opacities or effusions. No edema. No acute bony abnormality. IMPRESSION: Cardiomegaly. Bilateral hilar prominence, particularly on the right. Cannot exclude adenopathy. This could be further evaluated with contrast-enhanced chest CT if felt clinically indicated. Electronically Signed   By: Rolm Baptise M.D.   On: 07/23/2019 19:32   DG CHEST PORT 1 VIEW  Result Date: 07/14/2019 CLINICAL DATA:  Pulmonary nodule. EXAM: PORTABLE CHEST 1 VIEW  COMPARISON:  Radiograph 07/12/2019. CT 05/31/2017 FINDINGS: Unchanged cardiomegaly. Unchanged mediastinal contours. The rounded density at the right lung base on prior radiograph is not seen currently. Pulmonary vascular congestion is similar to prior. Streaky bibasilar atelectasis. No new airspace disease or pneumothorax. No large pleural effusion. IMPRESSION: 1. Unchanged cardiomegaly and pulmonary vascular congestion. 2. The rounded density at the right lung base on prior radiograph is not seen currently. 3. Streaky bibasilar atelectasis. No new abnormality. Electronically Signed   By: Keith Rake M.D.   On: 07/14/2019 05:41   Mammogram Digital Screening  Result Date: 07/01/2019 CLINICAL DATA:  Screening. EXAM: DIGITAL SCREENING BILATERAL MAMMOGRAM WITH CAD COMPARISON:  Previous exam(s). ACR Breast Density Category b: There are scattered areas of fibroglandular density. FINDINGS: There are no findings suspicious for malignancy. Images were processed with CAD. IMPRESSION: No mammographic evidence of malignancy. A result letter of this screening mammogram will be mailed directly to the patient. RECOMMENDATION: Screening mammogram in one year. (Code:SM-B-01Y) BI-RADS CATEGORY  1: Negative. Electronically Signed   By: Lajean Manes M.D.   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 Please perform CT-guided bone marrow biopsy for tissue diagnostic purposes. Note, questioned left-sided sacroiliitis with identified on recent pelvic CT and as such the decision was made to proceed with bone marrow biopsy of the right iliac crest. EXAM: CT-GUIDED BONE MARROW BIOPSY AND ASPIRATION MEDICATIONS: None ANESTHESIA/SEDATION: Fentanyl 150 mcg IV; Versed 3 mg IV Sedation Time: 10 Minutes; The patient was continuously monitored during the procedure by the  interventional radiology nurse under my direct supervision. COMPLICATIONS: None immediate. PROCEDURE: Informed consent was obtained from the patient following an explanation of the procedure, risks, benefits and alternatives. The patient understands, agrees and consents for the procedure. All questions were addressed. A time out was performed prior to the initiation of the procedure. The patient was positioned prone and non-contrast localization CT was performed of the pelvis to demonstrate the iliac marrow spaces. The operative site was prepped and draped in the usual sterile fashion. Under sterile conditions and local anesthesia, a 22 gauge spinal needle was utilized for procedural planning. Next, an 11 gauge coaxial bone biopsy needle was advanced into the right iliac marrow space. Needle position was confirmed with CT imaging. Initially, bone marrow aspiration was performed. Next, a bone marrow biopsy was obtained with the 11 gauge outer bone marrow device. Samples were prepared with  the cytotechnologist and deemed adequate. The needle was removed intact. Hemostasis was obtained with compression and a dressing was placed. The patient tolerated the procedure well without immediate post procedural complication. IMPRESSION: Successful CT guided right iliac bone marrow aspiration and core biopsy. Electronically Signed   By: Sandi Mariscal M.D.   On: 07/13/2019 11:06   DG Hip Unilat W or Wo Pelvis 2-3 Views Left  Result Date: 07/10/2019 CLINICAL DATA:  Left-sided hip pain EXAM: DG HIP (WITH OR WITHOUT PELVIS) 2-3V LEFT COMPARISON:  CT 05/29/2017 FINDINGS: No fracture or malalignment. Mild joint space narrowing. Pubic symphysis and rami appear intact. Questionable lytic change involving the left sacral ala. IMPRESSION: 1. No acute osseous abnormality of left hip. Mild degenerative change 2. Questionable lytic process involving the left sacral ala. CT could be obtained for further evaluation. Electronically Signed    By: Donavan Foil M.D.   On: 07/10/2019 17:34     Subjective: Eager to go to SNF  Discharge Exam: Vitals:   07/27/19 2037 07/28/19 0552  BP:  (!) 121/57  Pulse:  74  Resp:  18  Temp:  97.8 F (36.6 C)  SpO2: 96% 99%   Vitals:   07/27/19 2027 07/27/19 2028 07/27/19 2037 07/28/19 0552  BP: (!) 109/50 (!) 109/50  (!) 121/57  Pulse: 83 83  74  Resp: _0 Temp: 99.3 F (37.4 C) 99.3 F (37.4 C)  97.8 F (36.6 C)  TempSrc: Oral Oral  Oral  SpO2: 96%  96% 99%  Weight:      Height:        General: Pt is alert, awake, not in acute distress Cardiovascular: RRR, S1/S2 +, no rubs, no gallops Respiratory: CTA bilaterally, no wheezing, no rhonchi Abdominal: Soft, NT, ND, bowel sounds + Extremities: no edema, no cyanosis   The results of significant diagnostics from this hospitalization (including imaging, microbiology, ancillary and laboratory) are listed below for reference.     Microbiology: Recent Results (from the past 240 hour(s))  SARS CORONAVIRUS 2 (TAT 6-24 HRS) Nasopharyngeal Nasopharyngeal Swab     Status: None   Collection Time: 07/22/19  2:52 PM   Specimen: Nasopharyngeal Swab  Result Value Ref Range Status   SARS Coronavirus 2 NEGATIVE NEGATIVE Final    Comment: (NOTE) SARS-CoV-2 target nucleic acids are NOT DETECTED. The SARS-CoV-2 RNA is generally detectable in upper and lower respiratory specimens during the acute phase of infection. Negative results do not preclude SARS-CoV-2 infection, do not rule out co-infections with other pathogens, and should not be used as the sole basis for treatment or other patient management decisions. Negative results must be combined with clinical observations, patient history, and epidemiological information. The expected result is Negative. Fact Sheet for Patients: SugarRoll.be Fact Sheet for Healthcare Providers: https://www.woods-mathews.com/ This test is not yet approved  or cleared by the [place] FDA and  has been authorized for detection and/or diagnosis of SARS-CoV-2 by FDA under an Emergency Use Authorization (EUA). This EUA will remain  in effect (meaning this test can be used) for the duration of the COVID-19 declaration under Section 56 4(b)(1) of the Act, 21 U.S.C. section 360bbb-3(b)(1), unless the authorization is terminated or revoked sooner. Performed at [facility], [place] [address] [place], [place] [address]   Culture, Urine     Status: None   Collection Time: 07/23/19  8:40 PM   Specimen: Urine, Random  Result Value Ref Range Status   Specimen Description  Final    URINE, RANDOM Performed at [facility], [place] [address], [place], [place] [address]    Special Requests   Final    NONE Performed at [facility], [place] [address] [place], [place] [address]    Culture   Final    NO GROWTH Performed at [facility], [place] [address], [place], [place] [address]    Report Status 07/24/2019 FINAL  Final  SARS CORONAVIRUS 2 (TAT 6-24 HRS) Nasopharyngeal Nasopharyngeal Swab     Status: None   Collection Time: 07/27/19  1:34 PM   Specimen: Nasopharyngeal Swab  Result Value Ref Range Status   SARS Coronavirus 2 NEGATIVE NEGATIVE Final    Comment: (NOTE) SARS-CoV-2 target nucleic acids are NOT DETECTED. The SARS-CoV-2 RNA is generally detectable in upper and lower respiratory specimens during the acute phase of infection. Negative results do not preclude SARS-CoV-2 infection, do not rule out co-infections with other pathogens, and should not be used as the sole basis for treatment or other patient management decisions. Negative results must be combined with clinical observations, patient history, and epidemiological information. The expected result is Negative. Fact Sheet for Patients: SugarRoll.be Fact Sheet for Healthcare  Providers: https://www.woods-mathews.com/ This test is not yet approved or cleared by the [place] FDA and  has been authorized for detection and/or diagnosis of SARS-CoV-2 by FDA under an Emergency Use Authorization (EUA). This EUA will remain  in effect (meaning this test can be used) for the duration of the COVID-19 declaration under Section 56 4(b)(1) of the Act, 21 U.S.C. section 360bbb-3(b)(1), unless the authorization is terminated or revoked sooner. Performed at [facility], [place] [address] [place], [place] [address]      Labs: BNP (last 3 results) No results for input(s): BNP in the last 8760 hours. Basic Metabolic Panel: Recent Labs  Lab 07/22/19 0536 07/23/19 1716 07/24/19 0455 07/26/19 0522 07/27/19 0447  NA 135 135 137 134* 131*  K 4.4 4.0 3.8 4.2 4.0  CL 98 97* 99 96* 97*  CO2 _0 GLUCOSE 82 100* 84 77 80  BUN _1 32* 24*  CREATININE 1.03* 1.01* 0.87 1.30* 1.03*  CALCIUM 9.5 9.8 9.6 9.3 9.0   Liver Function Tests: Recent Labs  Lab 07/23/19 1716 07/24/19 0455 07/26/19 0522 07/27/19 0447  AST _2 ALT _3 ALKPHOS 288* 267* 242* 232*  BILITOT 0.5 0.5 0.3 <0.1*  PROT 8.1 8.0 7.6 6.8  ALBUMIN 3.2* 3.3* 3.2* 2.9*   No results for input(s): LIPASE, AMYLASE in the last 168 hours. Recent Labs  Lab 07/23/19 1716  AMMONIA 17   CBC: Recent Labs  Lab 07/22/19 0536 07/23/19 1716  WBC 89.1* 89.4*  NEUTROABS 77.0*  --   HGB 8.8* 10.1*  HCT 27.9* 31.9*  MCV 98.6 97.0  PLT 263 341   Cardiac Enzymes: No results for input(s): CKTOTAL, CKMB, CKMBINDEX, TROPONINI in the last 168 hours. BNP: Invalid input(s): POCBNP CBG: Recent Labs  Lab 07/23/19 2027 07/24/19 0125  GLUCAP 88 81   D-Dimer No results for input(s): DDIMER in the last 72 hours. Hgb A1c No results for input(s): HGBA1C in the last 72 hours. Lipid Profile No results for input(s): CHOL, HDL, LDLCALC, TRIG, CHOLHDL, LDLDIRECT in  the last 72 hours. Thyroid function studies No results for input(s): TSH, T4TOTAL, T3FREE, THYROIDAB in the last 72 hours.  Invalid input(s): FREET3 Anemia work  up No results for input(s): VITAMINB12, FOLATE, FERRITIN, TIBC, IRON, RETICCTPCT in the last 72 hours. Urinalysis    Component Value Date/Time   COLORURINE YELLOW 07/23/2019 2041   APPEARANCEUR CLEAR 07/23/2019 2041   LABSPEC 1.015 07/23/2019 2041   PHURINE 7.0 07/23/2019 2041   GLUCOSEU NEGATIVE 07/23/2019 2041   HGBUR MODERATE (A) 07/23/2019 2041   [place] NEGATIVE 07/23/2019 2041   [place] NEGATIVE 07/23/2019 2041   PROTEINUR 30 (A) 07/23/2019 2041   UROBILINOGEN 1.0 09/09/2012 0051   NITRITE NEGATIVE 07/23/2019 2041   LEUKOCYTESUR NEGATIVE 07/23/2019 2041   Sepsis Labs Invalid input(s): PROCALCITONIN,  WBC,  LACTICIDVEN Microbiology Recent Results (from the past 240 hour(s))  SARS CORONAVIRUS 2 (TAT 6-24 HRS) Nasopharyngeal Nasopharyngeal Swab     Status: None   Collection Time: 07/22/19  2:52 PM   Specimen: Nasopharyngeal Swab  Result Value Ref Range Status   SARS Coronavirus 2 NEGATIVE NEGATIVE Final    Comment: (NOTE) SARS-CoV-2 target nucleic acids are NOT DETECTED. The SARS-CoV-2 RNA is generally detectable in upper and lower respiratory specimens during the acute phase of infection. Negative results do not preclude SARS-CoV-2 infection, do not rule out co-infections with other pathogens, and should not be used as the sole basis for treatment or other patient management decisions. Negative results must be combined with clinical observations, patient history, and epidemiological information. The expected result is Negative. Fact Sheet for Patients: SugarRoll.be Fact Sheet for Healthcare Providers: https://www.woods-mathews.com/ This test is not yet approved or cleared by the [place] FDA and  has been authorized for detection and/or diagnosis of  SARS-CoV-2 by FDA under an Emergency Use Authorization (EUA). This EUA will remain  in effect (meaning this test can be used) for the duration of the COVID-19 declaration under Section 56 4(b)(1) of the Act, 21 U.S.C. section 360bbb-3(b)(1), unless the authorization is terminated or revoked sooner. Performed at [facility], [place] [address] [place], [place] [address]   Culture, Urine     Status: None   Collection Time: 07/23/19  8:40 PM   Specimen: Urine, Random  Result Value Ref Range Status   Specimen Description   Final    URINE, RANDOM Performed at [place] [address] [place], [place] [address]    Special Requests   Final    NONE Performed at [facility], [place] [address], [place], [place] [address]    Culture   Final    NO GROWTH Performed at [facility], [place] [address] [place], [place] [address]    Report Status 07/24/2019 FINAL  Final  SARS CORONAVIRUS 2 (TAT 6-24 HRS) Nasopharyngeal Nasopharyngeal Swab     Status: None   Collection Time: 07/27/19  1:34 PM   Specimen: Nasopharyngeal Swab  Result Value Ref Range Status   SARS Coronavirus 2 NEGATIVE NEGATIVE Final    Comment: (NOTE) SARS-CoV-2 target nucleic acids are NOT DETECTED. The SARS-CoV-2 RNA is generally detectable in upper and lower respiratory specimens during the acute phase of infection. Negative results do not preclude SARS-CoV-2 infection, do not rule out co-infections with other pathogens, and should not be used as the sole basis for treatment or other patient management decisions. Negative results must be combined with clinical observations, patient history, and epidemiological information. The expected result is Negative. Fact Sheet for Patients: SugarRoll.be Fact Sheet for Healthcare Providers: https://www.woods-mathews.com/ This test is not yet approved or cleared by the [place] FDA and  has  been authorized for  detection and/or diagnosis of SARS-CoV-2 by FDA under an Emergency Use Authorization (EUA). This EUA will remain  in effect (meaning this test can be used) for the duration of the COVID-19 declaration under Section 56 4(b)(1) of the Act, 21 U.S.C. section 360bbb-3(b)(1), unless the authorization is terminated or revoked sooner. Performed at [facility], [place] [address], [place], [place] [address]    Time spent: 30 min  SIGNED:   Marylu Lund, MD  [facility] 07/28/2019, 10:03 AM  If 7PM-7AM, please contact night-coverage

## 2019-07-28 NOTE — Progress Notes (Signed)
PT Cancellation Note  Patient Details Name: Erika [name] MRN: [id number] DOB: 1941-07-23   Cancelled Treatment:    Reason Eval/Treat Not Completed: Other (comment)  Spoke with RN who reports pt d/cing to SNF and transport has been called.  Will hold PT at this time. Maggie Font, PT Acute Rehab Services Pager [phone] [facility] [phone]    Karlton Lemon 07/28/2019, 1:16 PM

## 2019-07-28 NOTE — TOC Transition Note (Signed)
Transition of Care [facility]) - CM/SW Discharge Note   Patient Details  Name: Erika [name] MRN: [id number] Date of Birth: 1940-11-04  Transition of [facility]) CM/SW Contact:  Dessa Phi, RN Phone Number: 07/28/2019, 10:32 AM   Clinical Narrative: discharge summary sent to [address] rep [facility] to accept. Awaiting rm,tel# for nurse to call report. [facility] for transport.      Final next level of care: Skilled Nursing Facility Barriers to Discharge: No Barriers Identified   Patient Goals and CMS Choice Patient states their goals for this hospitalization and ongoing recovery are:: to be home CMS Medicare.gov Compare Post Acute Care list provided to:: Patient Choice offered to / list presented to : Patient  Discharge Placement   Existing PASRR number confirmed : 07/22/19          Patient chooses bed at: [facility] Patient to be transferred to facility by: [facility] Name of family member notified: Jerral Bonito grand dtr Patient and family notified of of transfer: 07/28/19  Discharge Plan and Services In-house Referral: Clinical Social Work Discharge Planning Services: [employer] Consult Post Acute Care Choice: [facility]                               Social Determinants of Health (SDOH) Interventions     Readmission Risk Interventions No flowsheet data found.

## 2019-07-28 NOTE — Telephone Encounter (Signed)
Returned patient's phone call regarding rescheduling an appointment, left a voicemail. 

## 2019-08-01 ENCOUNTER — Inpatient Hospital Stay (HOSPITAL_COMMUNITY)
Admission: EM | Admit: 2019-08-01 | Discharge: 2019-08-05 | DRG: 689 | Disposition: A | Discharge status: Skilled Nursing Facility | Payer: PPO | Source: Skilled Nursing Facility | Attending: Internal Medicine | Admitting: Internal Medicine

## 2019-08-01 ENCOUNTER — Other Ambulatory Visit: Payer: Self-pay

## 2019-08-01 ENCOUNTER — Emergency Department (HOSPITAL_COMMUNITY): Payer: PPO

## 2019-08-01 DIAGNOSIS — Z7401 Bed confinement status: Secondary | ICD-10-CM | POA: Diagnosis not present

## 2019-08-01 DIAGNOSIS — N179 Acute kidney failure, unspecified: Secondary | ICD-10-CM | POA: Diagnosis not present

## 2019-08-01 DIAGNOSIS — I10 Essential (primary) hypertension: Secondary | ICD-10-CM | POA: Diagnosis not present

## 2019-08-01 DIAGNOSIS — D469 Myelodysplastic syndrome, unspecified: Secondary | ICD-10-CM | POA: Diagnosis present

## 2019-08-01 DIAGNOSIS — Z888 Allergy status to other drugs, medicaments and biological substances status: Secondary | ICD-10-CM

## 2019-08-01 DIAGNOSIS — Z03818 Encounter for observation for suspected exposure to other biological agents ruled out: Secondary | ICD-10-CM | POA: Diagnosis not present

## 2019-08-01 DIAGNOSIS — K219 Gastro-esophageal reflux disease without esophagitis: Secondary | ICD-10-CM | POA: Diagnosis present

## 2019-08-01 DIAGNOSIS — N3000 Acute cystitis without hematuria: Principal | ICD-10-CM | POA: Diagnosis present

## 2019-08-01 DIAGNOSIS — Z20828 Contact with and (suspected) exposure to other viral communicable diseases: Secondary | ICD-10-CM | POA: Diagnosis present

## 2019-08-01 DIAGNOSIS — R41841 Cognitive communication deficit: Secondary | ICD-10-CM | POA: Diagnosis not present

## 2019-08-01 DIAGNOSIS — N390 Urinary tract infection, site not specified: Secondary | ICD-10-CM | POA: Diagnosis present

## 2019-08-01 DIAGNOSIS — Z833 Family history of diabetes mellitus: Secondary | ICD-10-CM | POA: Diagnosis not present

## 2019-08-01 DIAGNOSIS — D471 Chronic myeloproliferative disease: Secondary | ICD-10-CM | POA: Diagnosis present

## 2019-08-01 DIAGNOSIS — M549 Dorsalgia, unspecified: Secondary | ICD-10-CM | POA: Diagnosis not present

## 2019-08-01 DIAGNOSIS — Z79899 Other long term (current) drug therapy: Secondary | ICD-10-CM

## 2019-08-01 DIAGNOSIS — E66813 Obesity, class 3: Secondary | ICD-10-CM | POA: Diagnosis present

## 2019-08-01 DIAGNOSIS — Z823 Family history of stroke: Secondary | ICD-10-CM | POA: Diagnosis not present

## 2019-08-01 DIAGNOSIS — G8929 Other chronic pain: Secondary | ICD-10-CM | POA: Diagnosis present

## 2019-08-01 DIAGNOSIS — Z7982 Long term (current) use of aspirin: Secondary | ICD-10-CM

## 2019-08-01 DIAGNOSIS — Z8249 Family history of ischemic heart disease and other diseases of the circulatory system: Secondary | ICD-10-CM | POA: Diagnosis not present

## 2019-08-01 DIAGNOSIS — R410 Disorientation, unspecified: Secondary | ICD-10-CM | POA: Diagnosis not present

## 2019-08-01 DIAGNOSIS — R404 Transient alteration of awareness: Secondary | ICD-10-CM | POA: Diagnosis not present

## 2019-08-01 DIAGNOSIS — I480 Paroxysmal atrial fibrillation: Secondary | ICD-10-CM | POA: Diagnosis not present

## 2019-08-01 DIAGNOSIS — Z832 Family history of diseases of the blood and blood-forming organs and certain disorders involving the immune mechanism: Secondary | ICD-10-CM

## 2019-08-01 DIAGNOSIS — Z6841 Body Mass Index (BMI) 40.0 and over, adult: Secondary | ICD-10-CM

## 2019-08-01 DIAGNOSIS — R456 Violent behavior: Secondary | ICD-10-CM | POA: Diagnosis not present

## 2019-08-01 DIAGNOSIS — E860 Dehydration: Secondary | ICD-10-CM | POA: Diagnosis present

## 2019-08-01 DIAGNOSIS — R4182 Altered mental status, unspecified: Secondary | ICD-10-CM | POA: Diagnosis not present

## 2019-08-01 DIAGNOSIS — G9341 Metabolic encephalopathy: Secondary | ICD-10-CM | POA: Diagnosis not present

## 2019-08-01 DIAGNOSIS — F22 Delusional disorders: Secondary | ICD-10-CM | POA: Diagnosis not present

## 2019-08-01 DIAGNOSIS — M5137 Other intervertebral disc degeneration, lumbosacral region: Secondary | ICD-10-CM | POA: Diagnosis present

## 2019-08-01 DIAGNOSIS — M6281 Muscle weakness (generalized): Secondary | ICD-10-CM | POA: Diagnosis not present

## 2019-08-01 DIAGNOSIS — B961 Klebsiella pneumoniae [K. pneumoniae] as the cause of diseases classified elsewhere: Secondary | ICD-10-CM | POA: Diagnosis not present

## 2019-08-01 DIAGNOSIS — R442 Other hallucinations: Secondary | ICD-10-CM | POA: Diagnosis not present

## 2019-08-01 DIAGNOSIS — R2689 Other abnormalities of gait and mobility: Secondary | ICD-10-CM | POA: Diagnosis not present

## 2019-08-01 DIAGNOSIS — J45909 Unspecified asthma, uncomplicated: Secondary | ICD-10-CM | POA: Diagnosis present

## 2019-08-01 DIAGNOSIS — R062 Wheezing: Secondary | ICD-10-CM | POA: Diagnosis not present

## 2019-08-01 DIAGNOSIS — M2550 Pain in unspecified joint: Secondary | ICD-10-CM | POA: Diagnosis not present

## 2019-08-01 DIAGNOSIS — D72829 Elevated white blood cell count, unspecified: Secondary | ICD-10-CM | POA: Diagnosis present

## 2019-08-01 DIAGNOSIS — Z8611 Personal history of tuberculosis: Secondary | ICD-10-CM

## 2019-08-01 DIAGNOSIS — Z7901 Long term (current) use of anticoagulants: Secondary | ICD-10-CM | POA: Diagnosis not present

## 2019-08-01 DIAGNOSIS — R2681 Unsteadiness on feet: Secondary | ICD-10-CM | POA: Diagnosis not present

## 2019-08-01 LAB — URINALYSIS, COMPLETE (UACMP) WITH MICROSCOPIC
Glucose, UA: NEGATIVE mg/dL
Ketones, ur: NEGATIVE mg/dL
Nitrite: NEGATIVE
Protein, ur: 100 mg/dL — AB
Specific Gravity, Urine: 1.023 (ref 1.005–1.030)
WBC, UA: >50 WBC/hpf — ABNORMAL HIGH (ref 0–5)
pH: 5 (ref 5.0–8.0)

## 2019-08-01 LAB — COMPREHENSIVE METABOLIC PANEL
ALT: 14 U/L (ref 0–44)
AST: 21 U/L (ref 15–41)
Albumin: 3.7 g/dL (ref 3.5–5.0)
Alkaline Phosphatase: 268 U/L — ABNORMAL HIGH (ref 38–126)
Anion gap: 11 (ref 5–15)
BUN: 28 mg/dL — ABNORMAL HIGH (ref 8–23)
CO2: 23 mmol/L (ref 22–32)
Calcium: 8.9 mg/dL (ref 8.9–10.3)
Chloride: 95 mmol/L — ABNORMAL LOW (ref 98–111)
Creatinine, Ser: 2.02 mg/dL — ABNORMAL HIGH (ref 0.44–1.00)
GFR calc Af Amer: 27 mL/min — ABNORMAL LOW (ref >60)
GFR calc non Af Amer: 23 mL/min — ABNORMAL LOW (ref >60)
Glucose, Bld: 89 mg/dL (ref 70–99)
Potassium: 4.7 mmol/L (ref 3.5–5.1)
Sodium: 129 mmol/L — ABNORMAL LOW (ref 135–145)
Total Bilirubin: 0.4 mg/dL (ref 0.3–1.2)
Total Protein: 8.5 g/dL — ABNORMAL HIGH (ref 6.5–8.1)

## 2019-08-01 LAB — CBC WITH DIFFERENTIAL/PLATELET
Abs Immature Granulocytes: 0 10*3/uL (ref 0.00–0.07)
Band Neutrophils: 55 %
Basophils Absolute: 0 10*3/uL (ref 0.0–0.1)
Basophils Relative: 0 %
Blasts: 0 %
Eosinophils Absolute: 0 10*3/uL (ref 0.0–0.5)
Eosinophils Relative: 0 %
HCT: 34.4 % — ABNORMAL LOW (ref 36.0–46.0)
Hemoglobin: 11 g/dL — ABNORMAL LOW (ref 12.0–15.0)
Lymphocytes Relative: 3 %
Lymphs Abs: 3 10*3/uL (ref 0.7–4.0)
MCH: 31.2 pg (ref 26.0–34.0)
MCHC: 32 g/dL (ref 30.0–36.0)
MCV: 97.5 fL (ref 80.0–100.0)
Metamyelocytes Relative: 0 %
Monocytes Absolute: 1 10*3/uL (ref 0.1–1.0)
Monocytes Relative: 1 %
Myelocytes: 0 %
Neutro Abs: 94.8 10*3/uL — ABNORMAL HIGH (ref 1.7–7.7)
Neutrophils Relative %: 41 %
Other: 0 %
Platelets: 215 10*3/uL (ref 150–400)
Promyelocytes Relative: 0 %
RBC: 3.53 MIL/uL — ABNORMAL LOW (ref 3.87–5.11)
RDW: 16 % — ABNORMAL HIGH (ref 11.5–15.5)
WBC Morphology: INCREASED
WBC: 98.8 10*3/uL (ref 4.0–10.5)
nRBC: 0 /100 WBC

## 2019-08-01 LAB — CBG MONITORING, ED: Glucose-Capillary: 77 mg/dL (ref 70–99)

## 2019-08-01 MED ORDER — SODIUM CHLORIDE 0.9 % IV BOLUS
500.0000 mL | Freq: Once | INTRAVENOUS | Status: AC
  Administered 2019-08-01: 19:00:00 500 mL via INTRAVENOUS

## 2019-08-01 MED ORDER — SODIUM CHLORIDE 0.9 % IV SOLN
1.0000 g | INTRAVENOUS | Status: DC
  Administered 2019-08-02 – 2019-08-03 (×2): 1 g via INTRAVENOUS
  Filled 2019-08-01: qty 10
  Filled 2019-08-01 (×2): qty 1

## 2019-08-01 MED ORDER — SODIUM CHLORIDE 0.9 % IV BOLUS
500.0000 mL | Freq: Once | INTRAVENOUS | Status: AC
  Administered 2019-08-01: 500 mL via INTRAVENOUS

## 2019-08-01 MED ORDER — ACETAMINOPHEN 325 MG PO TABS: 650.0000 mg | ORAL_TABLET | Freq: Four times a day (QID) | ORAL | Status: DC | PRN

## 2019-08-01 MED ORDER — SODIUM CHLORIDE 0.9 % IV SOLN: INTRAVENOUS | Status: DC

## 2019-08-01 MED ORDER — ONDANSETRON HCL 4 MG PO TABS: 4.0000 mg | ORAL_TABLET | Freq: Four times a day (QID) | ORAL | Status: DC | PRN

## 2019-08-01 MED ORDER — SODIUM CHLORIDE 0.9 % IV SOLN
1.0000 g | Freq: Once | INTRAVENOUS | Status: AC
  Administered 2019-08-01: 20:00:00 1 g via INTRAVENOUS
  Filled 2019-08-01: qty 10

## 2019-08-01 MED ORDER — ONDANSETRON HCL 4 MG/2ML IJ SOLN: 4.0000 mg | Freq: Four times a day (QID) | INTRAMUSCULAR | Status: DC | PRN

## 2019-08-01 MED ORDER — ACETAMINOPHEN 650 MG RE SUPP: 650.0000 mg | Freq: Four times a day (QID) | RECTAL | Status: DC | PRN

## 2019-08-01 MED ORDER — HEPARIN SODIUM (PORCINE) 5000 UNIT/ML IJ SOLN
5000.0000 [IU] | Freq: Three times a day (TID) | INTRAMUSCULAR | Status: DC
  Administered 2019-08-01: 5000 [IU] via SUBCUTANEOUS

## 2019-08-01 NOTE — ED Triage Notes (Signed)
78 yo female BIB [facility] from [facility]. Pt was admitted there last Monday for rehab of her lower back. Facility called because pt was more confused that usual. Pt was showing signs of paranoia, aggression and more combative for the past 24-36 hours. Pt has been refusing to take her medications. Pt was given .25mg  of ativan x2 doses prior to EMS arrival. EMS gave pt 5 of haldol in 2.5 mg increments x 2 approximately 10 minutes apart as per [facility]. No complaints of fever or COVID related symptoms as per [facility].  Vitals: bp 110 palpated Hr 94 rr 16 spo2 96 % on room air cbg 110 Temp 98.3  Pts granddaughter is listed as her POA and next of kin

## 2019-08-01 NOTE — ED Notes (Signed)
ED TO INPATIENT HANDOFF REPORT  ED Nurse Name and Phone #: Jeoffrey Massed Name/Age/Gender Erika [name] 78 y.o. female Room/Bed: WA15/WA15  Code Status   Code Status: Prior  Home/SNF/Other Skilled nursing facility Patient oriented to: self Is this baseline? No   Triage Complete: Triage complete  Chief Complaint AMS (altered mental status) [R41.82]  Triage Note 78 yo female BIB [facility] from [facility]. Pt was admitted there last Monday for rehab of her lower back. Facility called because pt was more confused that usual. Pt was showing signs of paranoia, aggression and more combative for the past 24-36 hours. Pt has been refusing to take her medications. Pt was given .25mg  of ativan x2 doses prior to EMS arrival. EMS gave pt 5 of haldol in 2.5 mg increments x 2 approximately 10 minutes apart as per [facility]. No complaints of fever or COVID related symptoms as per [facility].  Vitals: bp 110 palpated Hr 94 rr 16 spo2 96 % on room air cbg 110 Temp 98.3  Pts granddaughter is listed as her POA and next of kin    Allergies Allergies  Allergen Reactions  . Prednisone Other (See Comments)    REACTION: Psychosis, talking out of head, insomnia    Level of Care/Admitting Diagnosis ED Disposition    ED Disposition Condition [place]: [place] [[id number]]  Level of Care: Telemetry [5]  Admit to tele based on following criteria: Other see comments  Comments: sepsis  Covid Evaluation: Asymptomatic Screening Protocol (No Symptoms)  Diagnosis: AMS (altered mental status) [id number]  Admitting Physician: Elwyn Reach [[id number]]  Attending Physician: Elwyn Reach [[id number]]  Estimated length of stay: past midnight tomorrow  Certification:: I certify this patient will need inpatient services for at least 2 midnights       B Medical/Surgery History Past Medical History:  Diagnosis Date  . Anemia    with menses  . Asthma   . Back  pain    status post surgery 2002  . Breast cyst    Excesion with FNA, begnin in 2004.   . Degenerative joint disease of spine    Imaging 2005,  Degenerative hypertrophic facet arthritis changes L4-5 and L5-S1..   . History of shingles    Recurrent with post herpetic neuralgia.   [name] Hypertension   . Lymphadenopathy    Of the mediastinum, Right side CXR 2008, not read on 2010 cxr.   . Menopause   . Obesity    BMI 54  . Psychosis ([place])    Secondary to prednisone.  . Shingles   . Stasis dermatitis    W/ LE edema, prviously on lasix now on mazxide.   . SVT (supraventricular tachycardia) [facility]) June 2009   one run while hospitalized  . Tuberculosis    active TB treated in 2002, hx of paraspinal lumbar TB,    Past Surgical History:  Procedure Laterality Date  . BREAST CYST ASPIRATION Left   . Breast cyst biopsy    . CHOLECYSTECTOMY  02/02/2012   Procedure: LAPAROSCOPIC CHOLECYSTECTOMY;  Surgeon: Zenovia Jarred, MD;  Location: [place];  Service: General;  Laterality: N/A;  . Hemilaminectomy of L4, L5 and S1 decompression of tumor in epidural space  2000  . IR FLUORO GUIDED NEEDLE PLC ASPIRATION/INJECTION LOC  07/17/2019     A IV Location/Drains/Wounds Patient Lines/Drains/Airways Status   Active Line/Drains/Airways    Name:   Placement date:   Placement  time:   Site:   Days:   Peripheral IV 07/26/19 Left;Lateral Forearm   07/26/19    0858    Forearm   6   Peripheral IV 08/01/19 Right Antecubital   08/01/19    1836    Antecubital   less than 1   External Urinary Catheter   07/11/19    0757    --   21   Incision 02/02/12 Abdomen Other (Comment)   02/02/12    1310     2737          Intake/Output Last 24 hours  Intake/Output Summary (Last 24 hours) at 08/01/2019 2158 Last data filed at 08/01/2019 2152 Gross per 24 hour  Intake 600.11 ml  Output --  Net 600.11 ml    Labs/Imaging Results for orders placed or performed during the hospital encounter of 08/01/19 (from the past  48 hour(s))  CBG monitoring, ED     Status: None   Collection Time: 08/01/19  6:05 PM  Result Value Ref Range   Glucose-Capillary 77 70 - 99 mg/dL   Comment 1 Notify RN    Comment 2 Document in Chart   Comprehensive metabolic panel     Status: Abnormal   Collection Time: 08/01/19  6:07 PM  Result Value Ref Range   Sodium 129 (L) 135 - 145 mmol/L   Potassium 4.7 3.5 - 5.1 mmol/L   Chloride 95 (L) 98 - 111 mmol/L   CO2 23 22 - 32 mmol/L   Glucose, Bld 89 70 - 99 mg/dL   BUN 28 (H) 8 - 23 mg/dL   Creatinine, Ser 2.02 (H) 0.44 - 1.00 mg/dL   Calcium 8.9 8.9 - 10.3 mg/dL   Total Protein 8.5 (H) 6.5 - 8.1 g/dL   Albumin 3.7 3.5 - 5.0 g/dL   AST 21 15 - 41 U/L   ALT 14 0 - 44 U/L   Alkaline Phosphatase 268 (H) 38 - 126 U/L   Total Bilirubin 0.4 0.3 - 1.2 mg/dL   GFR calc non Af Amer 23 (L) >60 mL/min   GFR calc Af Amer 27 (L) >60 mL/min   Anion gap 11 5 - 15    Comment: Performed at [facility], [place] [address], [place], [place] [address]  CBC WITH DIFFERENTIAL     Status: Abnormal   Collection Time: 08/01/19  6:07 PM  Result Value Ref Range   WBC 98.8 (HH) 4.0 - 10.5 K/uL    Comment: CRITICAL RESULT CALLED TO, READ BACK BY AND VERIFIED WITH: J.WATLOOHAMILTON,RN 1925 BY V.WILKINS    RBC 3.53 (L) 3.87 - 5.11 MIL/uL   Hemoglobin 11.0 (L) 12.0 - 15.0 g/dL   HCT 34.4 (L) 36.0 - 46.0 %   MCV 97.5 80.0 - 100.0 fL   MCH 31.2 26.0 - 34.0 pg   MCHC 32.0 30.0 - 36.0 g/dL   RDW 16.0 (H) 11.5 - 15.5 %   Platelets 215 150 - 400 K/uL   Neutrophils Relative % 41 %   Lymphocytes Relative 3 %   Monocytes Relative 1 %   Eosinophils Relative 0 %   Basophils Relative 0 %   Band Neutrophils 55 %   Metamyelocytes Relative 0 %   Myelocytes 0 %   Promyelocytes Relative 0 %   Blasts 0 %   nRBC 0 0 /100 WBC   Other 0 %   Neutro Abs 94.8 (H) 1.7 - 7.7 K/uL   Lymphs Abs 3.0 0.7 - 4.0  K/uL   Monocytes Absolute 1.0 0.1 - 1.0 K/uL   Eosinophils Absolute 0.0 0.0 - 0.5 K/uL    Basophils Absolute 0.0 0.0 - 0.1 K/uL   Abs Immature Granulocytes 0.00 0.00 - 0.07 K/uL   WBC Morphology INCREASED BANDS (>20% BANDS)     Comment: VACUOLATED NEUTROPHILS   Burr Cells PRESENT     Comment: Performed at [facility], [place] [address], [place], [place] [address]  Urinalysis, Complete w Microscopic     Status: Abnormal   Collection Time: 08/01/19  6:07 PM  Result Value Ref Range   Color, Urine AMBER (A) YELLOW    Comment: BIOCHEMICALS MAY BE AFFECTED BY COLOR   APPearance CLOUDY (A) CLEAR   Specific Gravity, Urine 1.023 1.005 - 1.030   pH 5.0 5.0 - 8.0   Glucose, UA NEGATIVE NEGATIVE mg/dL   Hgb urine dipstick MODERATE (A) NEGATIVE   Bilirubin Urine SMALL (A) NEGATIVE   Ketones, ur NEGATIVE NEGATIVE mg/dL   Protein, ur 100 (A) NEGATIVE mg/dL   Nitrite NEGATIVE NEGATIVE   Leukocytes,Ua LARGE (A) NEGATIVE   RBC / HPF 11-20 0 - 5 RBC/hpf   WBC, UA >50 (H) 0 - 5 WBC/hpf   Bacteria, UA MANY (A) NONE SEEN   Squamous Epithelial / LPF 21-50 0 - 5   WBC Clumps PRESENT    Mucus PRESENT    Granular Casts, UA PRESENT    Non Squamous Epithelial 0-5 (A) NONE SEEN    Comment: Performed at [facility], [place] [address], [place], [place] [address]   CT HEAD WO CONTRAST  Result Date: 08/01/2019 CLINICAL DATA:  78 year old female with altered mental status. EXAM: CT HEAD WITHOUT CONTRAST TECHNIQUE: Contiguous axial images were obtained from the base of the skull through the vertex without intravenous contrast. COMPARISON:  Head CT dated 07/23/2019. FINDINGS: Brain: The ventricles and sulci appropriate size for patient's age. Mild periventricular and deep white matter chronic microvascular ischemic changes noted. There is no acute intracranial hemorrhage. No mass effect or midline shift. No extra-axial fluid collection. Vascular: No hyperdense vessel or unexpected calcification. Skull: Normal. Negative for fracture or focal lesion. Sinuses/Orbits: No acute  finding. Other: None IMPRESSION: 1. No acute intracranial pathology. 2. Mild chronic microvascular ischemic changes. Electronically Signed   By: Anner Crete M.D.   On: 08/01/2019 19:16    Pending Labs Unresulted Labs (From admission, onward)    Start     Ordered   08/01/19 1948  SARS CORONAVIRUS 2 (TAT 6-24 HRS) Nasopharyngeal  (Tier 3 (TAT 6-24 hrs))  Once,   STAT    Question Answer Comment  Is this test for diagnosis or screening Screening   Symptomatic for COVID-19 as defined by CDC No   Hospitalized for COVID-19 No   Admitted to ICU for COVID-19 No   Previously tested for COVID-19 Yes   Resident in a congregate (group) care setting Yes   Employed in healthcare setting No   Pregnant No      08/01/19 1947   08/01/19 1947  Blood culture (routine x 2)  BLOOD CULTURE X 2,   STAT     08/01/19 1946   08/01/19 1930  Urine culture  Once,   STAT     08/01/19 1929   Signed and Held  CBC  (heparin)  Once,   R    Comments: Baseline for heparin therapy IF NOT ALREADY DRAWN.  Notify MD if PLT < 100 K.    Signed  and Held   Signed and Held  Creatinine, serum  (heparin)  Once,   R    Comments: Baseline for heparin therapy IF NOT ALREADY DRAWN.    Signed and Held   Signed and Held  Comprehensive metabolic panel  Tomorrow morning,   R     Signed and Held   Signed and Held  CBC  Tomorrow morning,   R     Signed and Held          Vitals/Pain Today's Vitals   08/01/19 1800 08/01/19 1945 08/01/19 2100 08/01/19 2130  BP: (!) 114/52 (!) 117/56 122/64 (!) 107/57  Pulse: 90 90 96 91  Resp: 19 18 16 16   SpO2: 96% 93% 95% 95%    Isolation Precautions No active isolations  Medications Medications  sodium chloride 0.9 % bolus 500 mL (500 mLs Intravenous New Bag/Given (Non-Interop) 08/01/19 1839)    And  0.9 %  sodium chloride infusion ( Intravenous New Bag/Given (Non-Interop) 08/01/19 1840)  sodium chloride 0.9 % bolus 500 mL (0 mLs Intravenous Stopped 08/01/19 2023)  cefTRIAXone  (ROCEPHIN) 1 g in sodium chloride 0.9 % 100 mL IVPB (0 g Intravenous Stopped 08/01/19 2152)    Mobility walks with device High fall risk   Focused Assessments Cardiac Assessment Handoff:    Lab Results  Component Value Date   CKTOTAL 74 07/17/2019   CKMB 3.7 02/03/2012   TROPONINI <0.30 02/03/2012   Lab Results  Component Value Date   DDIMER  01/24/2009    0.29        AT THE INHOUSE ESTABLISHED CUTOFF VALUE OF 0.48 ug/mL FEU, THIS ASSAY HAS BEEN DOCUMENTED IN THE LITERATURE TO HAVE A SENSITIVITY AND NEGATIVE PREDICTIVE VALUE OF AT LEAST 98 TO 99%.  THE TEST RESULT SHOULD BE CORRELATED WITH AN ASSESSMENT OF THE CLINICAL PROBABILITY OF DVT / VTE.   Does the Patient currently have chest pain? No  , Pulmonary Assessment Handoff:  Lung sounds:   O2 Device: Room Air        R Recommendations: See Admitting Provider Note  Report given to:   Additional Notes:

## 2019-08-01 NOTE — ED Notes (Signed)
Date and time results received: 08/01/19 19:23   Test: WBC Critical Value: 99.3  Name of Provider Notified: Dr. Tomi Bamberger  Orders Received? Or Actions Taken?: n/a

## 2019-08-01 NOTE — ED Provider Notes (Signed)
[facility] DEPT Provider Note   CSN: [id number] Arrival date & time: 08/01/19  1705     History Chief Complaint  Patient presents with  . Altered Mental Status    Erika [name] is a 78 y.o. female.  HPI   Patient presents to the emergency room for evaluation of confusion.  The patient was admitted to [facility] and rehab last Monday for issues with lower back pain.  Facility called EMS because over the last 24 to 36 hours the patient has been more paranoid and aggressive.  She has been refusing to take her medications.  Patient has not had fevers.  There is no report of vomiting and diarrhea.  EMS gave the patient 2 doses of Ativan as well as 5 mg of Haldol.  On my exam the patient is sedated.  She will wake up and answer some of my questions but she falls back asleep.  She tells me she is here because she is dehydrated.  She is not able to provide me any history about her aggression or combativeness.   Past Medical History:  Diagnosis Date  . Anemia    with menses  . Asthma   . Back pain    status post surgery 2002  . Breast cyst    Excesion with FNA, begnin in 2004.   . Degenerative joint disease of spine    Imaging 2005,  Degenerative hypertrophic facet arthritis changes L4-5 and L5-S1..   . History of shingles    Recurrent with post herpetic neuralgia.   [name] Hypertension   . Lymphadenopathy    Of the mediastinum, Right side CXR 2008, not read on 2010 cxr.   . Menopause   . Obesity    BMI 54  . Psychosis ([place])    Secondary to prednisone.  . Shingles   . Stasis dermatitis    W/ LE edema, prviously on lasix now on mazxide.   . SVT (supraventricular tachycardia) [facility]) June 2009   one run while hospitalized  . Tuberculosis    active TB treated in 2002, hx of paraspinal lumbar TB,     Patient Active Problem List   Diagnosis Date Noted  . Agitation 07/24/2019  . Tumor lysis syndrome   . Hyperuricemia   . Thrombocytopenia ([place])     . Myeloproliferative disorder ([place]) 07/10/2019  . AKI (acute kidney injury) ([place]) 07/10/2019  . AF (paroxysmal atrial fibrillation) ([place]) 06/02/2017  . Leukocytosis 05/29/2017  . Anemia 05/29/2017  . Leg swelling 02/23/2016  . Hand cramps 11/22/2015  . GERD (gastroesophageal reflux disease) 02/09/2013  . OA (osteoarthritis) of knee 01/29/2012  . OA (osteoarthritis of spine) 10/18/2011  . Healthcare maintenance 04/25/2011  . Hypokalemia 02/26/2008  . Moderate persistent asthma 02/10/2008  . Hyperlipidemia 08/04/2007  . H/O Herpes zoster 08/29/2006  . Obesity, Class III, BMI 40-49.9 (morbid obesity) ([place]) 08/29/2006  . History of breast cyst 08/29/2006  . Essential hypertension 08/27/2006  . 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 BREAST CYST ASPIRATION Left   . Breast cyst biopsy    . CHOLECYSTECTOMY  02/02/2012   Procedure: LAPAROSCOPIC CHOLECYSTECTOMY;  Surgeon: Zenovia Jarred, MD;  Location: [facility];  Service: General;  Laterality: N/A;  . Hemilaminectomy of L4, L5 and S1 decompression of tumor in epidural space  2000  . IR FLUORO GUIDED NEEDLE PLC  ASPIRATION/INJECTION LOC  07/17/2019     OB History   No obstetric history on file.     Family History  Problem Relation Age of Onset  . Stroke Mother        at young age  . Heart disease Mother   . Heart attack Father   . Heart disease Father   . Cancer Sister        Unknown  . Stroke Brother   . Stroke Brother   . Breast cancer Other   . Sickle cell anemia Brother   . Diabetes Brother   . Stroke Brother     Social History   Tobacco Use  . Smoking status: Never Smoker  . Smokeless tobacco: Never Used  Substance Use Topics  . Alcohol use: No    Alcohol/week: 0.0 standard drinks  . Drug use: No    Home Medications Prior to Admission medications   Medication Sig  Start Date End Date Taking? Authorizing Provider  ADVAIR DISKUS 500-50 MCG/DOSE AEPB INHALE 1 PUFF BY MOUTH TWICE DAILY Patient taking differently: Inhale 1 puff into the lungs 2 (two) times daily.  03/18/19  Yes [name], MD  albuterol (PROVENTIL HFA;VENTOLIN HFA) 108 (90 Base) MCG/ACT inhaler INHALE 1 TO 2 PUFFS BY MOUTH EVERY 6 HOURS AS NEEDED Patient taking differently: Inhale 1-2 puffs into the lungs every 6 (six) hours as needed for wheezing or shortness of breath.  10/30/18  Yes Brunetta Genera, MD  allopurinol (ZYLOPRIM) 300 MG tablet Take 0.5 tablets (150 mg total) by mouth 2 (two) times daily. 07/28/19 08/27/19 Yes Donne Hazel, MD  amLODipine (NORVASC) 10 MG tablet Take 1 tablet (10 mg total) by mouth daily. 07/22/19  Yes [name], MD  apixaban (ELIQUIS) 5 MG TABS tablet Take 1 tablet (5 mg total) by mouth 2 (two) times daily. 12/10/18  Yes [name], MD  aspirin EC 81 MG tablet Take 81 mg by mouth daily.   Yes [provider]  diphenhydrAMINE (BENADRYL) 25 MG tablet Take 25 mg by mouth as needed. Once a day prn   Yes [provider]  fluticasone (FLONASE) 50 MCG/ACT nasal spray SHAKE LIQUID AND USE 1 SPRAY IN EACH NOSTRIL DAILY Patient taking differently: Place 1 spray into both nostrils daily.  04/16/19  Yes [name], MD  furosemide (LASIX) 20 MG tablet TAKE 1 TABLET(20 MG) BY MOUTH DAILY AS NEEDED FOR EDEMA Patient taking differently: Take 20 mg by mouth daily as needed for edema.  04/14/19  Yes [name], MD  metoprolol tartrate (LOPRESSOR) 50 MG tablet Take 1 tablet (50 mg total) by mouth 2 (two) times daily. 07/28/19 08/27/19 Yes Donne Hazel, MD  nystatin (MYCOSTATIN/NYSTOP) powder Apply topically 2 (two) times daily. 07/28/19  Yes Donne Hazel, MD  omeprazole (PRILOSEC) 20 MG capsule TAKE 1 CAPSULE BY MOUTH EVERY DAY Patient taking differently: Take 20 mg by mouth daily.  09/29/18  Yes [name], MD  pravastatin  (PRAVACHOL) 20 MG tablet Take 1 tablet (20 mg total) by mouth daily. 04/15/19  Yes [name], MD  QUEtiapine (SEROQUEL) 25 MG tablet Take 0.5 tablets (12.5 mg total) by mouth 2 (two) times daily as needed (agitation). 07/28/19  Yes Donne Hazel, MD  QUEtiapine (SEROQUEL) 25 MG tablet Take 1 tablet (25 mg total) by mouth at bedtime. Patient taking differently: Take 12.5 mg by mouth at bedtime.  07/28/19 08/27/19 Yes Donne Hazel, MD  tiZANidine (ZANAFLEX) 4 MG tablet Take 1 tablet (4 mg total) by mouth every 8 (eight) hours as needed for muscle spasms. 07/28/19  Yes Donne Hazel, MD  sodium chloride (OCEAN) 0.65 % SOLN nasal spray Place 1 spray into both nostrils as needed for congestion. Patient not taking: Reported on 03/20/2019 10/31/17   Lorella Nimrod, MD    Allergies    Prednisone  Review of Systems   Review of Systems  Constitutional: Negative for fever.  Respiratory: Negative for chest tightness and shortness of breath.   Cardiovascular: Negative for chest pain.  Gastrointestinal: Negative for abdominal pain.  All other systems reviewed and are negative.   Physical Exam Updated Vital Signs BP (!) 115/56 (BP Location: Left Arm)   Pulse 94   Resp 17   SpO2 97%   Physical Exam Vitals and nursing note reviewed.  Constitutional:      General: She is not in acute distress.    Appearance: She is well-developed.  HENT:     Head: Normocephalic and atraumatic.     Right Ear: External ear normal.     Left Ear: External ear normal.  Eyes:     General: No scleral icterus.       Right eye: No discharge.        Left eye: No discharge.     Conjunctiva/sclera: Conjunctivae normal.  Neck:     Trachea: No tracheal deviation.  Cardiovascular:     Rate and Rhythm: Normal rate and regular rhythm.  Pulmonary:     Effort: Pulmonary effort is normal. No respiratory distress.     Breath sounds: Normal breath sounds. No stridor. No wheezing or rales.  Abdominal:     General:  Bowel sounds are normal. There is no distension.     Palpations: Abdomen is soft.     Tenderness: There is no abdominal tenderness. There is no guarding or rebound.  Musculoskeletal:        General: No tenderness.     Cervical back: Neck supple.  Skin:    General: Skin is warm and dry.     Findings: No rash.  Neurological:     Mental Status: She is lethargic.     GCS: GCS eye subscore is 4. GCS verbal subscore is 4. GCS motor subscore is 6.     Cranial Nerves: No cranial nerve deficit (no facial droop, extraocular movements intact, no slurred speech).     Sensory: No sensory deficit.     Motor: No abnormal muscle tone or seizure activity.     Comments: 5-5 grip strength bilaterally, 5 out of 5 plantar flexion, patient does follow commands, she is confused about the year, patient falls asleep during my conversation but this is after she was given Ativan and Haldol by EMS     ED Results / Procedures / Treatments   Labs (all labs ordered are listed, but only abnormal results are displayed) Labs Reviewed  COMPREHENSIVE METABOLIC PANEL - Abnormal; Notable for the following components:      Result Value   Sodium 129 (*)    Chloride 95 (*)    BUN 28 (*)    Creatinine, Ser 2.02 (*)    Total Protein 8.5 (*)    Alkaline Phosphatase 268 (*)    GFR calc non Af Amer 23 (*)    GFR calc Af Amer 27 (*)    All other components within normal limits  CBC WITH DIFFERENTIAL/PLATELET - Abnormal; Notable for the following components:  WBC 98.8 (*)    RBC 3.53 (*)    Hemoglobin 11.0 (*)    HCT 34.4 (*)    RDW 16.0 (*)    Neutro Abs 94.8 (*)    All other components within normal limits  URINALYSIS, COMPLETE (UACMP) WITH MICROSCOPIC - Abnormal; Notable for the following components:   Color, Urine AMBER (*)    APPearance CLOUDY (*)    Hgb urine dipstick MODERATE (*)    Bilirubin Urine SMALL (*)    Protein, ur 100 (*)    Leukocytes,Ua LARGE (*)    WBC, UA >50 (*)    Bacteria, UA MANY (*)     Non Squamous Epithelial 0-5 (*)    All other components within normal limits  URINE CULTURE  CULTURE, BLOOD (ROUTINE X 2)  CULTURE, BLOOD (ROUTINE X 2)  SARS CORONAVIRUS 2 (TAT 6-24 HRS)  CBG MONITORING, ED    EKG None  Radiology CT HEAD WO CONTRAST  Result Date: 08/01/2019 CLINICAL DATA:  78 year old female with altered mental status. EXAM: CT HEAD WITHOUT CONTRAST TECHNIQUE: Contiguous axial images were obtained from the base of the skull through the vertex without intravenous contrast. COMPARISON:  Head CT dated 07/23/2019. FINDINGS: Brain: The ventricles and sulci appropriate size for patient's age. Mild periventricular and deep white matter chronic microvascular ischemic changes noted. There is no acute intracranial hemorrhage. No mass effect or midline shift. No extra-axial fluid collection. Vascular: No hyperdense vessel or unexpected calcification. Skull: Normal. Negative for fracture or focal lesion. Sinuses/Orbits: No acute finding. Other: None IMPRESSION: 1. No acute intracranial pathology. 2. Mild chronic microvascular ischemic changes. Electronically Signed   By: Anner Crete M.D.   On: 08/01/2019 19:16    Procedures .Critical Care Performed by: Dorie Rank, MD Authorized by: Dorie Rank, MD   Critical care provider statement:    Critical care time (minutes):  45   Critical care was time spent personally by me on the following activities:  Discussions with consultants, evaluation of patient's response to treatment, examination of patient, ordering and performing treatments and interventions, ordering and review of laboratory studies, ordering and review of radiographic studies, pulse oximetry, re-evaluation of patient's condition, obtaining history from patient or surrogate and review of old charts   (including critical care time)  Medications Ordered in ED Medications  sodium chloride 0.9 % bolus 500 mL (500 mLs Intravenous New Bag/Given (Non-Interop) 08/01/19 1839)     And  0.9 %  sodium chloride infusion ( Intravenous New Bag/Given (Non-Interop) 08/01/19 1840)  sodium chloride 0.9 % bolus 500 mL (has no administration in time range)  cefTRIAXone (ROCEPHIN) 1 g in sodium chloride 0.9 % 100 mL IVPB (has no administration in time range)    ED Course  I have reviewed the triage vital signs and the nursing notes.  Pertinent labs & imaging results that were available during my care of the patient were reviewed by me and considered in my medical decision making (see chart for details).  Clinical Course as of Jul 31 1956  Sat Aug 01, 2019  1927 Urinalysis is consistent with a urinary tract infection   [JK]  1928 Sodium is trending downward compared to recent values.  BUN and creatinine is elevated compared to recent value   [JK]  1948 White blood cell count is significantly elevated.  Predominantly neutrophils.  Patient does have history of myeloproliferative disorder.  At baseline she has an elevated white blood cell count   [JK]  1957 I attempted to  contact the patient's granddaughter who is listed at the power of attorney.  Unable to get through the listed telephone number   [JK]    Clinical Course User Index [JK] Dorie Rank, MD   MDM Rules/Calculators/A&P                      Patient presented with a change in her behavior.  Patient CT scan does not show any acute abnormality.  Patient's labs are notable for elevated BUN and creatinine and a urinalysis suggestive of a urinary tract infection.  Patient's white blood cell count however is elevated at 98.8 but she has a history of myelo proliferative disorder and is chronically elevated.  Patient does have evidence of acute kidney injury as well as urinary tract infection.  This is probably contributing to her behavioral changes.  Plan on admission for further treatment and monitoring. Final Clinical Impression(s) / ED Diagnoses Final diagnoses:  Acute cystitis without hematuria  AKI (acute kidney  injury) ([place])  Myeloproliferative disorder ([place])      Dorie Rank, MD 08/01/19 1958

## 2019-08-01 NOTE — H&P (Signed)
History and Physical   APIPHANY TROCHEZ [id number] DOB: 03-30-1941 DOA: 08/01/2019  Referring MD/NP/PA: Dr. Dorie Rank  PCP: [name], MD   Outpatient Specialists: None  Patient coming from: [facility]  Chief Complaint: Altered mental status and hallucination  HPI: Erika [name] is a 78 y.o. female with medical history significant of asthma, degenerative disc disease, chronic back pain, history of psychosis, hypertension morbid obesity and SVTs, Myeloproliferative disorder who was admitted to skilled facility last week after inpatient treatment for low back pain.  Patient was brought in due to progressive confusion over the last 2 days.  She has become paranoid and very aggressive. Patient confused, answering few questions and wanting drink of water. Patient now found to have UTI. .  ED Course: Temperature 98.8, BP 132/55, P 96, R 21, Sats 97% , WBC 98.8 Hb `11.0, Platelets 215 Sodium 129, Potassium 4.7, Chloride 95, Co2 23, BUN 28, Creatinine 2.02, Calcium 8.9. UA shows wbc >50, many Bacteria, Leucocytes. Urine and blood culture obtained. Head CT negative and Covid 19 negative.  Review of Systems: As per HPI otherwise 10 point review of systems negative.    Past Medical History:  Diagnosis Date  . Anemia    with menses  . Asthma   . Back pain    status post surgery 2002  . Breast cyst    Excesion with FNA, begnin in 2004.   . Degenerative joint disease of spine    Imaging 2005,  Degenerative hypertrophic facet arthritis changes L4-5 and L5-S1..   . History of shingles    Recurrent with post herpetic neuralgia.   [name] Hypertension   . Lymphadenopathy    Of the mediastinum, Right side CXR 2008, not read on 2010 cxr.   . Menopause   . Obesity    BMI 54  . Psychosis ([place])    Secondary to prednisone.  . Shingles   . Stasis dermatitis    W/ LE edema, prviously on lasix now on mazxide.   . SVT (supraventricular tachycardia) [facility]) June 2009     one run while hospitalized  . Tuberculosis    active TB treated in 2002, hx of paraspinal lumbar TB,     Past Surgical History:  Procedure Laterality Date  . BREAST CYST ASPIRATION Left   . Breast cyst biopsy    . CHOLECYSTECTOMY  02/02/2012   Procedure: LAPAROSCOPIC CHOLECYSTECTOMY;  Surgeon: Zenovia Jarred, MD;  Location: [place];  Service: General;  Laterality: N/A;  . Hemilaminectomy of L4, L5 and S1 decompression of tumor in epidural space  2000  . IR FLUORO GUIDED NEEDLE PLC ASPIRATION/INJECTION LOC  07/17/2019     reports that she has never smoked. She has never used smokeless tobacco. She reports that she does not drink alcohol or use drugs.  Allergies  Allergen Reactions  . Prednisone Other (See Comments)    REACTION: Psychosis, talking out of head, insomnia    Family History  Problem Relation Age of Onset  . Stroke Mother        at young age  . Heart disease Mother   . Heart attack Father   . Heart disease Father   . Cancer Sister        Unknown  . Stroke Brother   . Stroke Brother   . Breast cancer Other   . Sickle cell anemia Brother   . Diabetes Brother   . Stroke Brother      Prior  to Admission medications   Medication Sig Start Date End Date Taking? Authorizing Provider  ADVAIR DISKUS 500-50 MCG/DOSE AEPB INHALE 1 PUFF BY MOUTH TWICE DAILY Patient taking differently: Inhale 1 puff into the lungs 2 (two) times daily.  03/18/19  Yes [name], MD  albuterol (PROVENTIL HFA;VENTOLIN HFA) 108 (90 Base) MCG/ACT inhaler INHALE 1 TO 2 PUFFS BY MOUTH EVERY 6 HOURS AS NEEDED Patient taking differently: Inhale 1-2 puffs into the lungs every 6 (six) hours as needed for wheezing or shortness of breath.  10/30/18  Yes Brunetta Genera, MD  allopurinol (ZYLOPRIM) 300 MG tablet Take 0.5 tablets (150 mg total) by mouth 2 (two) times daily. 07/28/19 08/27/19 Yes Donne Hazel, MD  amLODipine (NORVASC) 10 MG tablet Take 1 tablet (10 mg total) by mouth daily.  07/22/19  Yes [name], MD  apixaban (ELIQUIS) 5 MG TABS tablet Take 1 tablet (5 mg total) by mouth 2 (two) times daily. 12/10/18  Yes [name], MD  aspirin EC 81 MG tablet Take 81 mg by mouth daily.   Yes [provider]  diphenhydrAMINE (BENADRYL) 25 MG tablet Take 25 mg by mouth as needed. Once a day prn   Yes [provider]  fluticasone (FLONASE) 50 MCG/ACT nasal spray SHAKE LIQUID AND USE 1 SPRAY IN EACH NOSTRIL DAILY Patient taking differently: Place 1 spray into both nostrils daily.  04/16/19  Yes [name], MD  furosemide (LASIX) 20 MG tablet TAKE 1 TABLET(20 MG) BY MOUTH DAILY AS NEEDED FOR EDEMA Patient taking differently: Take 20 mg by mouth daily as needed for edema.  04/14/19  Yes [name], MD  metoprolol tartrate (LOPRESSOR) 50 MG tablet Take 1 tablet (50 mg total) by mouth 2 (two) times daily. 07/28/19 08/27/19 Yes Donne Hazel, MD  nystatin (MYCOSTATIN/NYSTOP) powder Apply topically 2 (two) times daily. 07/28/19  Yes Donne Hazel, MD  omeprazole (PRILOSEC) 20 MG capsule TAKE 1 CAPSULE BY MOUTH EVERY DAY Patient taking differently: Take 20 mg by mouth daily.  09/29/18  Yes [name], MD  pravastatin (PRAVACHOL) 20 MG tablet Take 1 tablet (20 mg total) by mouth daily. 04/15/19  Yes [name], MD  QUEtiapine (SEROQUEL) 25 MG tablet Take 0.5 tablets (12.5 mg total) by mouth 2 (two) times daily as needed (agitation). 07/28/19  Yes Donne Hazel, MD  QUEtiapine (SEROQUEL) 25 MG tablet Take 1 tablet (25 mg total) by mouth at bedtime. Patient taking differently: Take 12.5 mg by mouth at bedtime.  07/28/19 08/27/19 Yes Donne Hazel, MD  tiZANidine (ZANAFLEX) 4 MG tablet Take 1 tablet (4 mg total) by mouth every 8 (eight) hours as needed for muscle spasms. 07/28/19  Yes Donne Hazel, MD  sodium chloride (OCEAN) 0.65 % SOLN nasal spray Place 1 spray into both nostrils as needed for congestion. Patient not taking: Reported on  03/20/2019 10/31/17   Lorella Nimrod, MD    Physical Exam: Vitals:   08/01/19 1719 08/01/19 1745 08/01/19 1800 08/01/19 1945  BP: (!) 115/56 (!) 132/55 (!) 114/52 (!) 117/56  Pulse: 94 89 90 90  Resp: 17 (!) 21 19 18   SpO2: 97% 96% 96% 93%      Constitutional: Confused with no acute distress Vitals:   08/01/19 1719 08/01/19 1745 08/01/19 1800 08/01/19 1945  BP: (!) 115/56 (!) 132/55 (!) 114/52 (!) 117/56  Pulse: 94 89 90 90  Resp: 17 (!) 21 19 18   SpO2: 97% 96%  96% 93%   Eyes: PERRL, lids and conjunctivae normal ENMT: Mucous membranes are dry posterior pharynx clear of any exudate or lesions.Normal dentition.  Neck: normal, supple, no masses, no thyromegaly Respiratory: clear to auscultation bilaterally, no wheezing, no crackles. Normal respiratory effort. No accessory muscle use.  Cardiovascular: Irregularly irregular rate and rhythm, no murmurs / rubs / gallops. No extremity edema. 2+ pedal pulses. No carotid bruits.  Abdomen: no tenderness, no masses palpated. No hepatosplenomegaly. Bowel sounds positive.  Musculoskeletal: no clubbing / cyanosis. No joint deformity upper and lower extremities. Good ROM, no contractures. Normal muscle tone.  Skin: no rashes, lesions, ulcers. No induration Neurologic: CN 2-12 grossly intact. Sensation intact, DTR normal. Strength 5/5 in all 4.  Psychiatric: Confused but partially responded to questions    Labs on Admission: I have personally reviewed following labs and imaging studies  CBC: Recent Labs  Lab 08/01/19 1807  WBC 98.8*  NEUTROABS 94.8*  HGB 11.0*  HCT 34.4*  MCV 97.5  PLT 123456   Basic Metabolic Panel: Recent Labs  Lab 07/26/19 0522 07/27/19 0447 08/01/19 1807  NA 134* 131* 129*  K 4.2 4.0 4.7  CL 96* 97* 95*  CO2 27 23 23   GLUCOSE 77 80 89  BUN 32* 24* 28*  CREATININE 1.30* 1.03* 2.02*  CALCIUM 9.3 9.0 8.9   GFR: CrCl cannot be calculated (Unknown ideal weight.). Liver Function Tests: Recent Labs  Lab  07/26/19 0522 07/27/19 0447 08/01/19 1807  AST 17 17 21   ALT 14 13 14   ALKPHOS 242* 232* 268*  BILITOT 0.3 <0.1* 0.4  PROT 7.6 6.8 8.5*  ALBUMIN 3.2* 2.9* 3.7   No results for input(s): LIPASE, AMYLASE in the last 168 hours. No results for input(s): AMMONIA in the last 168 hours. Coagulation Profile: No results for input(s): INR, PROTIME in the last 168 hours. Cardiac Enzymes: No results for input(s): CKTOTAL, CKMB, CKMBINDEX, TROPONINI in the last 168 hours. BNP (last 3 results) No results for input(s): PROBNP in the last 8760 hours. HbA1C: No results for input(s): HGBA1C in the last 72 hours. CBG: Recent Labs  Lab 08/01/19 1805  GLUCAP 77   Lipid Profile: No results for input(s): CHOL, HDL, LDLCALC, TRIG, CHOLHDL, LDLDIRECT in the last 72 hours. Thyroid Function Tests: No results for input(s): TSH, T4TOTAL, FREET4, T3FREE, THYROIDAB in the last 72 hours. Anemia Panel: No results for input(s): VITAMINB12, FOLATE, FERRITIN, TIBC, IRON, RETICCTPCT in the last 72 hours. Urine analysis:    Component Value Date/Time   COLORURINE AMBER (A) 08/01/2019 1807   APPEARANCEUR CLOUDY (A) 08/01/2019 1807   LABSPEC 1.023 08/01/2019 1807   PHURINE 5.0 08/01/2019 1807   GLUCOSEU NEGATIVE 08/01/2019 1807   HGBUR MODERATE (A) 08/01/2019 1807   BILIRUBINUR SMALL (A) 08/01/2019 1807   KETONESUR NEGATIVE 08/01/2019 1807   PROTEINUR 100 (A) 08/01/2019 1807   UROBILINOGEN 1.0 09/09/2012 0051   NITRITE NEGATIVE 08/01/2019 1807   LEUKOCYTESUR LARGE (A) 08/01/2019 1807   Sepsis Labs: @LABRCNTIP (procalcitonin:4,lacticidven:4) ) Recent Results (from the past 240 hour(s))  Culture, Urine     Status: None   Collection Time: 07/23/19  8:40 PM   Specimen: Urine, Random  Result Value Ref Range Status   Specimen Description   Final    URINE, RANDOM Performed at [facility], [place] [address] [place], [place] [address]    Special Requests   Final    NONE Performed at  [facility], [place] [address] [place], [place] [address]  Culture   Final    NO GROWTH Performed at [facility], [place] [address] [place], [place] [address]    Report Status 07/24/2019 FINAL  Final  SARS CORONAVIRUS 2 (TAT 6-24 HRS) Nasopharyngeal Nasopharyngeal Swab     Status: None   Collection Time: 07/27/19  1:34 PM   Specimen: Nasopharyngeal Swab  Result Value Ref Range Status   SARS Coronavirus 2 NEGATIVE NEGATIVE Final    Comment: (NOTE) SARS-CoV-2 target nucleic acids are NOT DETECTED. The SARS-CoV-2 RNA is generally detectable in upper and lower respiratory specimens during the acute phase of infection. Negative results do not preclude SARS-CoV-2 infection, do not rule out co-infections with other pathogens, and should not be used as the sole basis for treatment or other patient management decisions. Negative results must be combined with clinical observations, patient history, and epidemiological information. The expected result is Negative. Fact Sheet for Patients: SugarRoll.be Fact Sheet for Healthcare Providers: https://www.woods-mathews.com/ This test is not yet approved or cleared by the [place] FDA and  has been authorized for detection and/or diagnosis of SARS-CoV-2 by FDA under an Emergency Use Authorization (EUA). This EUA will remain  in effect (meaning this test can be used) for the duration of the COVID-19 declaration under Section 56 4(b)(1) of the Act, 21 U.S.C. section 360bbb-3(b)(1), unless the authorization is terminated or revoked sooner. Performed at [facility], [place] [address] [place],  [address]      Radiological Exams on Admission: CT HEAD WO CONTRAST  Result Date: 08/01/2019 CLINICAL DATA:  78 year old female with altered mental status. EXAM: CT HEAD WITHOUT CONTRAST TECHNIQUE: Contiguous axial images were obtained from the base of the skull through the  vertex without intravenous contrast. COMPARISON:  Head CT dated 07/23/2019. FINDINGS: Brain: The ventricles and sulci appropriate size for patient's age. Mild periventricular and deep white matter chronic microvascular ischemic changes noted. There is no acute intracranial hemorrhage. No mass effect or midline shift. No extra-axial fluid collection. Vascular: No hyperdense vessel or unexpected calcification. Skull: Normal. Negative for fracture or focal lesion. Sinuses/Orbits: No acute finding. Other: None IMPRESSION: 1. No acute intracranial pathology. 2. Mild chronic microvascular ischemic changes. Electronically Signed   By: Anner Crete M.D.   On: 08/01/2019 19:16    Assessment/Plan Principal Problem:   AMS (altered mental status) Active Problems:   Obesity, Class III, BMI 40-49.9 (morbid obesity) (HCC)   Essential hypertension   GERD (gastroesophageal reflux disease)   Leukocytosis   AF (paroxysmal atrial fibrillation) (HCC)   Myeloproliferative disorder (HCC)   AKI (acute kidney injury) ([place])   Acute lower UTI     #1. AMS: Due to UTI. Will treat underlying condition and monitor.  Hydrate patient and monitor mental status.  #2 UTI: Urine cultures: Urine and blood cultures obtained.  Empiric Rocephin   #3 myeloproliferative disorder: White count more than 98,000.  Continue monitoring  #4 essential hypertension: Continue blood pressure control  #5 GERD: Continue with PPIs  #6 morbid obesity: Monitor on dietary counseling  #7 paroxysmal atrial fibrillation: Continue rate control and anticoagulation  #8 acute kidney injury: Most likely due to dehydration.  Hydrate and monitor closely  DVT prophylaxis: Apixaban Code Status: Full code Family Communication: No family at bedside Disposition Plan: Back to skilled facility Consults called: None Admission status: Inpatient  Severity of Illness: The appropriate patient status for this patient is INPATIENT. Inpatient status is  judged to be reasonable and necessary in order to provide the required  intensity of service to ensure the patient's safety. The patient's presenting symptoms, physical exam findings, and initial radiographic and laboratory data in the context of their chronic comorbidities is felt to place them at high risk for further clinical deterioration. Furthermore, it is not anticipated that the patient will be medically stable for discharge from the hospital within 2 midnights of admission. The following factors support the patient status of inpatient.   " The patient's presenting symptoms include confusion. " The worrisome physical exam findings include confused status. " The initial radiographic and laboratory data are worrisome because of evidence of UTI. " The chronic co-morbidities include A. fib.   * I certify that at the point of admission it is my clinical judgment that the patient will require inpatient hospital care spanning beyond 2 midnights from the point of admission due to high intensity of service, high risk for further deterioration and high frequency of surveillance required.Barbette Merino MD [facility] Pager [phone]  If 7PM-7AM, please contact night-coverage [email or web address] Password [facility]  08/01/2019, 8:49 PM

## 2019-08-02 ENCOUNTER — Encounter (HOSPITAL_COMMUNITY): Payer: Self-pay | Admitting: Internal Medicine

## 2019-08-02 DIAGNOSIS — D471 Chronic myeloproliferative disease: Secondary | ICD-10-CM

## 2019-08-02 DIAGNOSIS — I10 Essential (primary) hypertension: Secondary | ICD-10-CM

## 2019-08-02 DIAGNOSIS — R4182 Altered mental status, unspecified: Secondary | ICD-10-CM

## 2019-08-02 DIAGNOSIS — N390 Urinary tract infection, site not specified: Secondary | ICD-10-CM

## 2019-08-02 LAB — SARS CORONAVIRUS 2 (TAT 6-24 HRS): SARS Coronavirus 2: NEGATIVE

## 2019-08-02 LAB — COMPREHENSIVE METABOLIC PANEL
ALT: 13 U/L (ref 0–44)
AST: 19 U/L (ref 15–41)
Albumin: 3.3 g/dL — ABNORMAL LOW (ref 3.5–5.0)
Alkaline Phosphatase: 231 U/L — ABNORMAL HIGH (ref 38–126)
Anion gap: 15 (ref 5–15)
BUN: 29 mg/dL — ABNORMAL HIGH (ref 8–23)
CO2: 24 mmol/L (ref 22–32)
Calcium: 8.4 mg/dL — ABNORMAL LOW (ref 8.9–10.3)
Chloride: 97 mmol/L — ABNORMAL LOW (ref 98–111)
Creatinine, Ser: 1.5 mg/dL — ABNORMAL HIGH (ref 0.44–1.00)
GFR calc Af Amer: 39 mL/min — ABNORMAL LOW (ref >60)
GFR calc non Af Amer: 33 mL/min — ABNORMAL LOW (ref >60)
Glucose, Bld: 51 mg/dL — ABNORMAL LOW (ref 70–99)
Potassium: 4.1 mmol/L (ref 3.5–5.1)
Sodium: 136 mmol/L (ref 135–145)
Total Bilirubin: 0.5 mg/dL (ref 0.3–1.2)
Total Protein: 7.6 g/dL (ref 6.5–8.1)

## 2019-08-02 LAB — CBC
HCT: 28.9 % — ABNORMAL LOW (ref 36.0–46.0)
Hemoglobin: 9 g/dL — ABNORMAL LOW (ref 12.0–15.0)
MCH: 30.7 pg (ref 26.0–34.0)
MCHC: 31.1 g/dL (ref 30.0–36.0)
MCV: 98.6 fL (ref 80.0–100.0)
Platelets: 194 10*3/uL (ref 150–400)
RBC: 2.93 MIL/uL — ABNORMAL LOW (ref 3.87–5.11)
RDW: 15.9 % — ABNORMAL HIGH (ref 11.5–15.5)
WBC: 76.7 10*3/uL (ref 4.0–10.5)
nRBC: 0 % (ref 0.0–0.2)

## 2019-08-02 MED ORDER — TIZANIDINE HCL 4 MG PO TABS
4.0000 mg | ORAL_TABLET | Freq: Three times a day (TID) | ORAL | Status: DC | PRN
  Administered 2019-08-02: 4 mg via ORAL
  Filled 2019-08-02: qty 1

## 2019-08-02 MED ORDER — ALLOPURINOL 300 MG PO TABS
150.0000 mg | ORAL_TABLET | Freq: Two times a day (BID) | ORAL | Status: DC
  Administered 2019-08-02 – 2019-08-05 (×8): 150 mg via ORAL
  Filled 2019-08-02 (×7): qty 1

## 2019-08-02 MED ORDER — DIPHENHYDRAMINE HCL 25 MG PO CAPS
25.0000 mg | ORAL_CAPSULE | Freq: Every evening | ORAL | Status: DC | PRN
  Filled 2019-08-02: qty 1

## 2019-08-02 MED ORDER — QUETIAPINE FUMARATE 25 MG PO TABS: 12.5000 mg | ORAL_TABLET | Freq: Two times a day (BID) | ORAL | Status: DC | PRN

## 2019-08-02 MED ORDER — ASPIRIN EC 81 MG PO TBEC
81.0000 mg | DELAYED_RELEASE_TABLET | Freq: Every day | ORAL | Status: DC
  Administered 2019-08-02 – 2019-08-05 (×4): 81 mg via ORAL
  Filled 2019-08-02 (×4): qty 1

## 2019-08-02 MED ORDER — APIXABAN 5 MG PO TABS
5.0000 mg | ORAL_TABLET | Freq: Two times a day (BID) | ORAL | Status: DC
  Administered 2019-08-02 – 2019-08-05 (×8): 5 mg via ORAL
  Filled 2019-08-02 (×7): qty 1

## 2019-08-02 MED ORDER — PRAVASTATIN SODIUM 20 MG PO TABS
20.0000 mg | ORAL_TABLET | Freq: Every day | ORAL | Status: DC
  Administered 2019-08-02 – 2019-08-04 (×3): 20 mg via ORAL
  Filled 2019-08-02 (×3): qty 1

## 2019-08-02 MED ORDER — NYSTATIN 100000 UNIT/GM EX POWD
Freq: Two times a day (BID) | CUTANEOUS | Status: DC
  Filled 2019-08-02: qty 15

## 2019-08-02 MED ORDER — AMLODIPINE BESYLATE 10 MG PO TABS
10.0000 mg | ORAL_TABLET | Freq: Every day | ORAL | Status: DC
  Administered 2019-08-02 – 2019-08-05 (×4): 10 mg via ORAL
  Filled 2019-08-02 (×4): qty 1

## 2019-08-02 MED ORDER — QUETIAPINE FUMARATE 25 MG PO TABS
12.5000 mg | ORAL_TABLET | Freq: Every day | ORAL | Status: DC
  Administered 2019-08-02 – 2019-08-04 (×4): 12.5 mg via ORAL
  Filled 2019-08-02 (×3): qty 1

## 2019-08-02 MED ORDER — ALBUTEROL SULFATE HFA 108 (90 BASE) MCG/ACT IN AERS: 1.0000 | INHALATION_SPRAY | Freq: Four times a day (QID) | RESPIRATORY_TRACT | Status: DC | PRN

## 2019-08-02 MED ORDER — METOPROLOL TARTRATE 50 MG PO TABS
50.0000 mg | ORAL_TABLET | Freq: Two times a day (BID) | ORAL | Status: DC
  Administered 2019-08-02 – 2019-08-05 (×8): 50 mg via ORAL
  Filled 2019-08-02 (×7): qty 1

## 2019-08-02 MED ORDER — PANTOPRAZOLE SODIUM 40 MG PO TBEC
40.0000 mg | DELAYED_RELEASE_TABLET | Freq: Every day | ORAL | Status: DC
  Administered 2019-08-02 – 2019-08-05 (×4): 40 mg via ORAL
  Filled 2019-08-02 (×4): qty 1

## 2019-08-02 MED ORDER — FLUTICASONE PROPIONATE 50 MCG/ACT NA SUSP
1.0000 | Freq: Every day | NASAL | Status: DC
  Administered 2019-08-03 – 2019-08-05 (×3): 1 via NASAL
  Filled 2019-08-02: qty 16

## 2019-08-02 NOTE — Progress Notes (Signed)
PROGRESS NOTE    Erika [name]  [id number] DOB: Jun 07, 1941 DOA: 08/01/2019 PCP: [name], MD   Brief Narrative:  78 year old with history of asthma, DDD, chronic low back pain, history of psychosis, HTN, morbid obesity, myeloproliferative disorder admitted from skilled nursing facility for confusion.  She was diagnosed with urinary tract infection.  CT of the head and COVID-19 negative.  Started on Rocephin.   Assessment & Plan:   Principal Problem:   AMS (altered mental status) Active Problems:   Obesity, Class III, BMI 40-49.9 (morbid obesity) (HCC)   Essential hypertension   GERD (gastroesophageal reflux disease)   Leukocytosis   AF (paroxysmal atrial fibrillation) (HCC)   Myeloproliferative disorder (HCC)   AKI (acute kidney injury) ([place])   Acute lower UTI  Acute metabolic encephalopathy secondary to urinary tract infection without hematuria -Follow-up urine cultures, continue IV Rocephin.  Neurochecks -CT of the head negative.  Encephalopathy seems to be improving  Chronic myeloproliferative disorder -WBC quite elevated.  Continue to monitor this  Essential hypertension -Norvasc 10 mg daily.  Metoprolol 50 mg twice daily  GERD -PPI  Morbid obesity with BMI greater than 40 -Counseled weight loss.  Paroxysmal atrial fibrillation -Metoprolol, Eliquis.  We will go ahead and consult PT OT and social worker to start disposition planning over next 24-48 hours  DVT prophylaxis: Eliquis Code Status: Full Family Communication: None at bedside Disposition Plan: Maintain hospital stay until mentation clears a bit more  Consultants:   None  Procedures:   None  Antimicrobials:   Rocephin day 2   Subjective: Seems to be awake this morning and answers basic questions.  She knows where she is in her name but does not recall why she was brought to the hospital.  Denies any complaints at the moment  Review of Systems Otherwise negative except as per  HPI, including: General: Denies fever, chills, night sweats or unintended weight loss. Resp: Denies cough, wheezing, shortness of breath. Cardiac: Denies chest pain, palpitations, orthopnea, paroxysmal nocturnal dyspnea. GI: Denies abdominal pain, nausea, vomiting, diarrhea or constipation GU: Denies dysuria, frequency, hesitancy or incontinence MS: Denies muscle aches, joint pain or swelling Neuro: Denies headache, neurologic deficits (focal weakness, numbness, tingling), abnormal gait Psych: Denies anxiety, depression, SI/HI/AVH Skin: Denies new rashes or lesions ID: Denies sick contacts, exotic exposures, travel  Objective: Vitals:   08/01/19 1946 08/01/19 2100 08/01/19 2130 08/01/19 2227  BP:  122/64 (!) 107/57 (!) 116/55  Pulse:  96 91 94  Resp:  16 16 17   Temp: 98.8 F (37.1 C)   98.6 F (37 C)  TempSrc: Oral   Oral  SpO2:  95% 95% 96%  Weight:    122.2 kg  Height:    5\' 6"  (1.676 m)    Intake/Output Summary (Last 24 hours) at 08/02/2019 1131 Last data filed at 08/02/2019 1000 Gross per 24 hour  Intake 1980.61 ml  Output 200 ml  Net 1780.61 ml   Filed Weights   08/01/19 2227  Weight: 122.2 kg    Examination:  General exam: Appears calm and comfortable  Respiratory system: Clear to auscultation. Respiratory effort normal. Cardiovascular system: S1 & S2 heard, RRR. No JVD, murmurs, rubs, gallops or clicks. No pedal edema. Gastrointestinal system: Abdomen is nondistended, soft and nontender. No organomegaly or masses felt. Normal bowel sounds heard. Central nervous system: Alert and oriented X2. No focal neurological deficits. Extremities: Symmetric 4 x 5 power. Skin: No rashes, lesions or ulcers Psychiatry: Judgement and insight appear poor.  Alert awake oriented to name and place.  Confused about time and how she got here    Data Reviewed:   CBC: Recent Labs  Lab 08/01/19 1807 08/02/19 0537  WBC 98.8* 76.7*  NEUTROABS 94.8*  --   HGB 11.0* 9.0*  HCT  34.4* 28.9*  MCV 97.5 98.6  PLT 215 [id number]   Basic Metabolic Panel: Recent Labs  Lab 07/27/19 0447 08/01/19 1807 08/02/19 0537  NA 131* 129* 136  K 4.0 4.7 4.1  CL 97* 95* 97*  CO2 23 23 24   GLUCOSE 80 89 51*  BUN 24* 28* 29*  CREATININE 1.03* 2.02* 1.50*  CALCIUM 9.0 8.9 8.4*   GFR: Estimated Creatinine Clearance: 42.6 mL/min (A) (by C-G formula based on SCr of 1.5 mg/dL (H)). Liver Function Tests: Recent Labs  Lab 07/27/19 0447 08/01/19 1807 08/02/19 0537  AST 17 21 19   ALT 13 14 13   ALKPHOS 232* 268* 231*  BILITOT <0.1* 0.4 0.5  PROT 6.8 8.5* 7.6  ALBUMIN 2.9* 3.7 3.3*   No results for input(s): LIPASE, AMYLASE in the last 168 hours. No results for input(s): AMMONIA in the last 168 hours. Coagulation Profile: No results for input(s): INR, PROTIME in the last 168 hours. Cardiac Enzymes: No results for input(s): CKTOTAL, CKMB, CKMBINDEX, TROPONINI in the last 168 hours. BNP (last 3 results) No results for input(s): PROBNP in the last 8760 hours. HbA1C: No results for input(s): HGBA1C in the last 72 hours. CBG: Recent Labs  Lab 08/01/19 1805  GLUCAP 77   Lipid Profile: No results for input(s): CHOL, HDL, LDLCALC, TRIG, CHOLHDL, LDLDIRECT in the last 72 hours. Thyroid Function Tests: No results for input(s): TSH, T4TOTAL, FREET4, T3FREE, THYROIDAB in the last 72 hours. Anemia Panel: No results for input(s): VITAMINB12, FOLATE, FERRITIN, TIBC, IRON, RETICCTPCT in the last 72 hours. Sepsis Labs: No results for input(s): PROCALCITON, LATICACIDVEN in the last 168 hours.  Recent Results (from the past 240 hour(s))  Culture, Urine     Status: None   Collection Time: 07/23/19  8:40 PM   Specimen: Urine, Random  Result Value Ref Range Status   Specimen Description   Final    URINE, RANDOM Performed at [facility] [address], [place], [place] [address]    Special Requests   Final    NONE Performed at [facility],  [place] [address], [place], [place] [address]    Culture   Final    NO GROWTH Performed at [facility], [place] [address], [place], [place] [address]    Report Status 07/24/2019 FINAL  Final  SARS CORONAVIRUS 2 (TAT 6-24 HRS) Nasopharyngeal Nasopharyngeal Swab     Status: None   Collection Time: 07/27/19  1:34 PM   Specimen: Nasopharyngeal Swab  Result Value Ref Range Status   SARS Coronavirus 2 NEGATIVE NEGATIVE Final    Comment: (NOTE) SARS-CoV-2 target nucleic acids are NOT DETECTED. The SARS-CoV-2 RNA is generally detectable in upper and lower respiratory specimens during the acute phase of infection. Negative results do not preclude SARS-CoV-2 infection, do not rule out co-infections with other pathogens, and should not be used as the sole basis for treatment or other patient management decisions. Negative results must be combined with clinical observations, patient history, and epidemiological information. The expected result is Negative. Fact Sheet for Patients: SugarRoll.be Fact Sheet for Healthcare Providers: https://www.woods-mathews.com/ This test is not yet approved or cleared by the [place] and  has been authorized  for detection and/or diagnosis of SARS-CoV-2 by FDA under an Emergency Use Authorization (EUA). This EUA will remain  in effect (meaning this test can be used) for the duration of the COVID-19 declaration under Section 56 4(b)(1) of the Act, 21 U.S.C. section 360bbb-3(b)(1), unless the authorization is terminated or revoked sooner. Performed at [facility], [place] [address] [place], [place] [address]   Blood culture (routine x 2)     Status: None (Preliminary result)   Collection Time: 08/01/19  8:12 PM   Specimen: BLOOD  Result Value Ref Range Status   Specimen Description   Final    BLOOD RIGHT ANTECUBITAL Performed at [facility] [address] [place], [place]  [address]    Special Requests   Final    BOTTLES DRAWN AEROBIC AND ANAEROBIC Blood Culture adequate volume Performed at [facility] [address] [place], [place] [address]    Culture   Final    NO GROWTH < 12 HOURS Performed at [facility] [address], [place], [place] [address]    Report Status PENDING  Incomplete  Blood culture (routine x 2)     Status: None (Preliminary result)   Collection Time: 08/01/19  8:12 PM   Specimen: BLOOD  Result Value Ref Range Status   Specimen Description   Final    BLOOD LEFT ANTECUBITAL Performed at [facility] [address], [place], [place] [address]    Special Requests   Final    BOTTLES DRAWN AEROBIC AND ANAEROBIC Blood Culture results may not be optimal due to an excessive volume of blood received in culture bottles Performed at [facility] [address], [place], [place] [address]    Culture  Setup Time ANAEROBIC BOTTLE ONLY NO ORGANISMS SEEN   Final   Culture   Final    NO GROWTH < 12 HOURS Performed at [facility], [place] [address], [place], [place] [address]    Report Status PENDING  Incomplete  SARS CORONAVIRUS 2 (TAT 6-24 HRS) Nasopharyngeal     Status: None   Collection Time: 08/01/19  8:12 PM   Specimen: Nasopharyngeal  Result Value Ref Range Status   SARS Coronavirus 2 NEGATIVE NEGATIVE Final    Comment: (NOTE) SARS-CoV-2 target nucleic acids are NOT DETECTED. The SARS-CoV-2 RNA is generally detectable in upper and lower respiratory specimens during the acute phase of infection. Negative results do not preclude SARS-CoV-2 infection, do not rule out co-infections with other pathogens, and should not be used as the sole basis for treatment or other patient management decisions. Negative results must be combined with clinical observations, patient history, and epidemiological information. The expected result is Negative. Fact Sheet for  Patients: SugarRoll.be Fact Sheet for Healthcare Providers: https://www.woods-mathews.com/ This test is not yet approved or cleared by the [place] FDA and  has been authorized for detection and/or diagnosis of SARS-CoV-2 by FDA under an Emergency Use Authorization (EUA). This EUA will remain  in effect (meaning this test can be used) for the duration of the COVID-19 declaration under Section 56 4(b)(1) of the Act, 21 U.S.C. section 360bbb-3(b)(1), unless the authorization is terminated or revoked sooner. Performed at [facility], [place] [address] [place],  [address]          Radiology Studies: CT HEAD WO CONTRAST  Result Date: 08/01/2019 CLINICAL DATA:  78 year old female with altered mental status. EXAM: CT HEAD WITHOUT CONTRAST TECHNIQUE:  Contiguous axial images were obtained from the base of the skull through the vertex without intravenous contrast. COMPARISON:  Head CT dated 07/23/2019. FINDINGS: Brain: The ventricles and sulci appropriate size for patient's age. Mild periventricular and deep white matter chronic microvascular ischemic changes noted. There is no acute intracranial hemorrhage. No mass effect or midline shift. No extra-axial fluid collection. Vascular: No hyperdense vessel or unexpected calcification. Skull: Normal. Negative for fracture or focal lesion. Sinuses/Orbits: No acute finding. Other: None IMPRESSION: 1. No acute intracranial pathology. 2. Mild chronic microvascular ischemic changes. Electronically Signed   By: Anner Crete M.D.   On: 08/01/2019 19:16        Scheduled Meds: . allopurinol  150 mg Oral BID  . amLODipine  10 mg Oral Daily  . apixaban  5 mg Oral BID  . aspirin EC  81 mg Oral Daily  . fluticasone  1 spray Each Nare Daily  . metoprolol tartrate  50 mg Oral BID  . nystatin   Topical BID  . pantoprazole  40 mg Oral Daily  . pravastatin  20 mg Oral q1800  . QUEtiapine  12.5 mg  Oral QHS   Continuous Infusions: . sodium chloride 125 mL/hr at 08/01/19 1840  . sodium chloride 100 mL/hr at 08/02/19 1010  . cefTRIAXone (ROCEPHIN)  IV       LOS: 1 day   Time spent= 25 mins    Coner Gibbard Arsenio Loader, MD [facility]  If 7PM-7AM, please contact night-coverage  08/02/2019, 11:31 AM

## 2019-08-02 NOTE — Plan of Care (Signed)

## 2019-08-02 NOTE — Progress Notes (Signed)
[facility] - BLOOD CULTURE  Erika [name] is an 78 y.o. female who presented to [facility] on 08/01/2019 with a chief complaint of confusion d/t UTI  Assessment:  1/4 BCx bottles growing GPCs in clusters (BCID not performed); source most likely contamination but given UTI, could be S saprophyticus from urinary source (UCx still pending)  Name of physician (or Provider) ContactedReesa Chew  Current antibiotics: Rocephin  Changes to prescribed antibiotics recommended: suspect contam as long as patient clinically stable/improved on Rocephin Patient is on recommended antibiotics - No changes needed  Lannie Yusuf A 08/02/2019  5:49 PM

## 2019-08-03 ENCOUNTER — Encounter (HOSPITAL_COMMUNITY): Payer: Self-pay | Admitting: Internal Medicine

## 2019-08-03 DIAGNOSIS — K219 Gastro-esophageal reflux disease without esophagitis: Secondary | ICD-10-CM

## 2019-08-03 LAB — CULTURE, BLOOD (ROUTINE X 2): Special Requests: ADEQUATE

## 2019-08-03 LAB — GLUCOSE, CAPILLARY: Glucose-Capillary: 87 mg/dL (ref 70–99)

## 2019-08-03 MED ORDER — ENSURE MAX PROTEIN PO LIQD
11.0000 [oz_av] | Freq: Two times a day (BID) | ORAL | Status: DC
  Administered 2019-08-03 – 2019-08-05 (×3): 11 [oz_av] via ORAL
  Filled 2019-08-03 (×5): qty 330

## 2019-08-03 MED ORDER — ADULT MULTIVITAMIN W/MINERALS CH
1.0000 | ORAL_TABLET | Freq: Every day | ORAL | Status: DC
  Administered 2019-08-03 – 2019-08-05 (×3): 1 via ORAL
  Filled 2019-08-03 (×3): qty 1

## 2019-08-03 MED ORDER — PRO-STAT SUGAR FREE PO LIQD
30.0000 mL | Freq: Every day | ORAL | Status: DC
  Administered 2019-08-03 – 2019-08-04 (×2): 30 mL via ORAL
  Filled 2019-08-03 (×2): qty 30

## 2019-08-03 NOTE — Evaluation (Signed)
Physical Therapy Evaluation Patient Details Name: Erika [name] MRN: [id number] DOB: 11-09-1940 Today's Date: 08/03/2019   History of Present Illness  78 yo female admitted from SNF with AMS, weakness. Hx of morbid obesity, anemia, asthma, DDD, chronic back pain, myeloproliferative disease, SVT, A fib, psychosis, shingles  Clinical Impression  On eval, pt required Max assist +2 for standing attempts and Min assist +2 for safety/equipment to perform a lateral scoot from bed to recliner. Attempted standing x 2 but pt was unable. Cognition was intact and pt was very pleasant throughout session. Unsure of patient's d/c plans at this time. Pt is still unable to stand or ambulate so recommendation is for pt to return to SNF for continued rehab. Will continue to follow and progress activity as tolerated.     Follow Up Recommendations SNF    Equipment Recommendations  None recommended by PT    Recommendations for Other Services OT consult     Precautions / Restrictions Precautions Precautions: Fall Restrictions Weight Bearing Restrictions: No      Mobility  Bed Mobility Overal bed mobility: Needs Assistance Bed Mobility: Supine to Sit     Supine to sit: Min assist;HOB elevated     General bed mobility comments: Increased time. Pt relied on bedrails.  Transfers Overall transfer level: Needs assistance Equipment used: Rolling walker (2 wheeled) Transfers: Sit to/from Stand;Lateral/Scoot Transfers Sit to Stand: Max assist;+2 physical assistance;+2 safety/equipment;From elevated surface        Lateral/Scoot Transfers: Min assist;+2 safety/equipment General transfer comment: Attempted sit to stand x 2 with Max A +2. Pt able to clear bottom but unable to fully extend knees enough to get to full standing. Lateral scoot xfer, bed to recliner-had +2 present for safety/equipment management  Ambulation/Gait                Stairs            Wheelchair Mobility     Modified Rankin (Stroke Patients Only)       Balance Overall balance assessment: Needs assistance Sitting-balance support: Feet supported Sitting balance-Leahy Scale: Good                                       Pertinent Vitals/Pain Pain Assessment: Faces Faces Pain Scale: Hurts a little bit Pain Location: L LE Pain Descriptors / Indicators: Sore Pain Intervention(s): Monitored during session;Repositioned    Home Living Family/patient expects to be discharged to:: Unsure   Available Help at Discharge: Family;Available PRN/intermittently Type of Home: Apartment Home Access: Level entry     Home Layout: One level Home Equipment: Walker - 4 wheels Additional Comments: lives with one son, second son comes by to help out, adult grandaughter helps with housecleaning. Both sons work during the day    Prior Function Level of Independence: Independent with assistive device(s)         Comments: rollator. drives herself to doctor appointments. son's do grocery shopping this year due to [place] exposure concerns.     Hand Dominance   Dominant Hand: Right    Extremity/Trunk Assessment   Upper Extremity Assessment Upper Extremity Assessment: Generalized weakness    Lower Extremity Assessment Lower Extremity Assessment: Generalized weakness    Cervical / Trunk Assessment Cervical / Trunk Exceptions: large body habitus  Communication   Communication: No difficulties  Cognition Arousal/Alertness: Awake/alert Behavior During Therapy: WFL for tasks assessed/performed Overall Cognitive Status: Within  Functional Limits for tasks assessed                                        General Comments      Exercises     Assessment/Plan    PT Assessment Patient needs continued PT services  PT Problem List Decreased strength;Decreased mobility;Decreased balance;Decreased activity tolerance;Decreased knowledge of use of DME       PT  Treatment Interventions DME instruction;Therapeutic activities;Therapeutic exercise;Gait training;Functional mobility training;Balance training    PT Goals (Current goals can be found in the Care Plan section)  Acute Rehab PT Goals Patient Stated Goal: to be able to get OOB and walk PT Goal Formulation: With patient Time For Goal Achievement: 08/17/19 Potential to Achieve Goals: Fair    Frequency Min 3X/week   Barriers to discharge        Co-evaluation               AM-PAC PT "6 Clicks" Mobility  Outcome Measure Help needed turning from your back to your side while in a flat bed without using bedrails?: A Little Help needed moving from lying on your back to sitting on the side of a flat bed without using bedrails?: A Little Help needed moving to and from a bed to a chair (including a wheelchair)?: A Lot Help needed standing up from a chair using your arms (e.g., wheelchair or bedside chair)?: Total Help needed to walk in hospital room?: Total Help needed climbing 3-5 steps with a railing? : Total 6 Click Score: 11    End of Session Equipment Utilized During Treatment: Gait belt Activity Tolerance: Patient tolerated treatment well Patient left: in chair;with call bell/phone within reach;with chair alarm set   PT Visit Diagnosis: Muscle weakness (generalized) (M62.81);Difficulty in walking, not elsewhere classified (R26.2)    Time: [id number] PT Time Calculation (min) (ACUTE ONLY): 18 min   Charges:   PT Evaluation $PT Eval Moderate Complexity: 1 Mod           Doreatha Massed, PT Acute Rehabilitation Services

## 2019-08-03 NOTE — Progress Notes (Signed)
Initial Nutrition Assessment  DOCUMENTATION CODES:   Morbid obesity  INTERVENTION:  - will order Ensure Max BID, each supplement provides 150 kcal and 30 grams protein. - will Will order 30 mL Prostat once/day, each supplement provides 100 kcal and 15 grams of protein. - will order daily multivitamin with minerals.   NUTRITION DIAGNOSIS:   Increased nutrient needs related to acute illness as evidenced by estimated needs.  GOAL:   Patient will meet greater than or equal to 90% of their needs  MONITOR:   PO intake, Supplement acceptance, Labs, Weight trends  REASON FOR ASSESSMENT:   Malnutrition Screening Tool  ASSESSMENT:   78 year old with history of asthma, DDD, chronic low back pain, history of psychosis, HTN, morbid obesity, and myeloproliferative disorder. She presented to the ED from SNF due to confusion and was diagnosed with UTI. CT head was negative. COVID-19 test negative. She was started on Rocephin and admitted.  No intakes documented since admission. She was confused at the time of admission but is now a/o x4. Patient denies any changes in appetite recently although she is unsure if she was eating well the few days PTA. She denies any abdominal pain/pressure or nausea. She is unsure if her weight has changed recently.  Per chart review, current weight is 269 lb. Weight had been trending up from 02/14/18-10/08/18. She then lost 12 lb (4% body weight) from 10/08/18-03/20/19. From 03/20/19-07/13/19 she regained this weight. Weight on 12/7 was 299 lb. This indicates 30 lb weight loss in the past 3 weeks; unsure if this is or is not accurate.   Per notes: - acute metabolic encephalopathy 2/2 UTI without hematuria--improving; CT head negative - chronic myeloproliferative disorder   Labs reviewed; Cl: 97 mmol/l, BUN: 29 mg/dl, creatinine: 1.5 mg/dl, Ca: 8.4 mg/dl, Alk Phos elevated, GFR: 39 ml/min. Medications reviewed.  IVF; NS @ 75 ml/hr.    NUTRITION - FOCUSED PHYSICAL  EXAM:  completed; no muscle or fat wasting, moderate edema to BLE.   Diet Order:   Diet Order            Diet Heart Room service appropriate? Yes; Fluid consistency: Thin  Diet effective now              EDUCATION NEEDS:   No education needs have been identified at this time  Skin:  Skin Assessment: Reviewed RN Assessment  Last BM:  12/28  Height:   Ht Readings from Last 1 Encounters:  08/01/19 _0  (1.676 m)    Weight:   Wt Readings from Last 1 Encounters:  08/01/19 122.2 kg    Ideal Body Weight:  59.1 kg  BMI:  Body mass index is 43.48 kg/m.  Estimated Nutritional Needs:   Kcal:  1900-2100 kcal  Protein:  90-105 grams  Fluid:  >/= 2 L/day     Jarome Matin, MS, RD, LDN, [facility] Inpatient Clinical Dietitian Pager # [phone] After hours/weekend pager # [phone]

## 2019-08-03 NOTE — Progress Notes (Signed)
PROGRESS NOTE    Erika [name]  [id number] DOB: May 06, 1941 DOA: 08/01/2019 PCP: [name], MD   Brief Narrative:  78 year old with history of asthma, DDD, chronic low back pain, history of psychosis, HTN, morbid obesity, myeloproliferative disorder admitted from skilled nursing facility for confusion.  She was diagnosed with urinary tract infection.  CT of the head and COVID-19 negative.  Started on Rocephin. UCx growing GNR.    Assessment & Plan:   Principal Problem:   AMS (altered mental status) Active Problems:   Obesity, Class III, BMI 40-49.9 (morbid obesity) (HCC)   Essential hypertension   GERD (gastroesophageal reflux disease)   Leukocytosis   AF (paroxysmal atrial fibrillation) (HCC)   Myeloproliferative disorder (HCC)   AKI (acute kidney injury) ([place])   Acute lower UTI  Acute metabolic encephalopathy secondary to urinary tract infection without hematuria; improved.  Gram Negative Rods.  -Ucx= GNR- Speciation and Susceptibility pending, continue IV Rocephin.  Neurochecks -CT of the head negative.  Encephalopathy seems to be improving  Chronic myeloproliferative disorder -WBC quite elevated.  Continue to monitor this  Essential hypertension -Norvasc 10 mg daily.  Metoprolol 50 mg twice daily  GERD -PPI  Morbid obesity with BMI greater than 40 -Counseled weight loss.  Paroxysmal atrial fibrillation -Metoprolol, Eliquis.  PT=SNF  DVT prophylaxis: Eliquis Code Status: Full Family Communication: Spoke with Gerald Stabs, son.  Disposition Plan: Awaiting for mentation to clear up. Improving daily. Hopefully we will have more culture data by tomorrow and her mentation would have returned to baseline.   Consultants:   None  Procedures:   None  Antimicrobials:   Rocephin day 3   Subjective: Sitting up on the bed this morning awake tolerating oral diet but somewhat confused about today's date and what might of brought her to the hospital.  Otherwise  denies any complaints and is in good spirits.  Review of Systems Otherwise negative except as per HPI, including: General = no fevers, chills, dizziness, malaise, fatigue HEENT/EYES = negative for pain, redness, loss of vision, double vision, blurred vision, loss of hearing, sore throat, hoarseness, dysphagia Cardiovascular= negative for chest pain, palpitation, murmurs, lower extremity swelling Respiratory/lungs= negative for shortness of breath, cough, hemoptysis, wheezing, mucus production Gastrointestinal= negative for nausea, vomiting,, abdominal pain, melena, hematemesis Genitourinary= negative for Dysuria, Hematuria, Change in Urinary Frequency MSK = Negative for arthralgia, myalgias, Back Pain, Joint swelling  Neurology= Negative for headache, seizures, numbness, tingling  Psychiatry= Negative for anxiety, depression, suicidal and homocidal ideation Allergy/Immunology= Medication/Food allergy as listed  Skin= Negative for Rash, lesions, ulcers, itching   Objective: Vitals:   08/01/19 2130 08/01/19 2227 08/02/19 2110 08/03/19 0640  BP: (!) 107/57 (!) 116/55 (!) 108/58 120/62  Pulse: 91 94 88 82  Resp: 16 17 18 18   Temp:  98.6 F (37 C) 98 F (36.7 C) 98.3 F (36.8 C)  TempSrc:  Oral Oral Oral  SpO2: 95% 96% 98% 95%  Weight:  122.2 kg    Height:  5\' 6"  (1.676 m)      Intake/Output Summary (Last 24 hours) at 08/03/2019 1125 Last data filed at 08/03/2019 0600 Gross per 24 hour  Intake 1709.24 ml  Output 1100 ml  Net 609.24 ml   Filed Weights   08/01/19 2227  Weight: 122.2 kg    Examination:  Constitutional: Not in acute distress Respiratory: Clear to auscultation bilaterally Cardiovascular: Normal sinus rhythm, no rubs Abdomen: Nontender nondistended good bowel sounds Musculoskeletal: No edema noted Skin: No rashes seen Neurologic:  CN 2-12 grossly intact.  And nonfocal Psychiatric: Slightly poor judgment and insight.  Awake alert oriented X 2 (baseline  AAOx3)  Data Reviewed:   CBC: Recent Labs  Lab 08/01/19 1807 08/02/19 0537  WBC 98.8* 76.7*  NEUTROABS 94.8*  --   HGB 11.0* 9.0*  HCT 34.4* 28.9*  MCV 97.5 98.6  PLT 215 [id number]   Basic Metabolic Panel: Recent Labs  Lab 08/01/19 1807 08/02/19 0537  NA 129* 136  K 4.7 4.1  CL 95* 97*  CO2 23 24  GLUCOSE 89 51*  BUN 28* 29*  CREATININE 2.02* 1.50*  CALCIUM 8.9 8.4*   GFR: Estimated Creatinine Clearance: 42.6 mL/min (A) (by C-G formula based on SCr of 1.5 mg/dL (H)). Liver Function Tests: Recent Labs  Lab 08/01/19 1807 08/02/19 0537  AST 21 19  ALT 14 13  ALKPHOS 268* 231*  BILITOT 0.4 0.5  PROT 8.5* 7.6  ALBUMIN 3.7 3.3*   No results for input(s): LIPASE, AMYLASE in the last 168 hours. No results for input(s): AMMONIA in the last 168 hours. Coagulation Profile: No results for input(s): INR, PROTIME in the last 168 hours. Cardiac Enzymes: No results for input(s): CKTOTAL, CKMB, CKMBINDEX, TROPONINI in the last 168 hours. BNP (last 3 results) No results for input(s): PROBNP in the last 8760 hours. HbA1C: No results for input(s): HGBA1C in the last 72 hours. CBG: Recent Labs  Lab 08/01/19 1805  GLUCAP 77   Lipid Profile: No results for input(s): CHOL, HDL, LDLCALC, TRIG, CHOLHDL, LDLDIRECT in the last 72 hours. Thyroid Function Tests: No results for input(s): TSH, T4TOTAL, FREET4, T3FREE, THYROIDAB in the last 72 hours. Anemia Panel: No results for input(s): VITAMINB12, FOLATE, FERRITIN, TIBC, IRON, RETICCTPCT in the last 72 hours. Sepsis Labs: No results for input(s): PROCALCITON, LATICACIDVEN in the last 168 hours.  Recent Results (from the past 240 hour(s))  SARS CORONAVIRUS 2 (TAT 6-24 HRS) Nasopharyngeal Nasopharyngeal Swab     Status: None   Collection Time: 07/27/19  1:34 PM   Specimen: Nasopharyngeal Swab  Result Value Ref Range Status   SARS Coronavirus 2 NEGATIVE NEGATIVE Final    Comment: (NOTE) SARS-CoV-2 target nucleic acids are NOT  DETECTED. The SARS-CoV-2 RNA is generally detectable in upper and lower respiratory specimens during the acute phase of infection. Negative results do not preclude SARS-CoV-2 infection, do not rule out co-infections with other pathogens, and should not be used as the sole basis for treatment or other patient management decisions. Negative results must be combined with clinical observations, patient history, and epidemiological information. The expected result is Negative. Fact Sheet for Patients: SugarRoll.be Fact Sheet for Healthcare Providers: https://www.woods-mathews.com/ This test is not yet approved or cleared by the [place] FDA and  has been authorized for detection and/or diagnosis of SARS-CoV-2 by FDA under an Emergency Use Authorization (EUA). This EUA will remain  in effect (meaning this test can be used) for the duration of the COVID-19 declaration under Section 56 4(b)(1) of the Act, 21 U.S.C. section 360bbb-3(b)(1), unless the authorization is terminated or revoked sooner. Performed at [facility], [place] [address] [place], [place] [address]   Urine culture     Status: Abnormal (Preliminary result)   Collection Time: 08/01/19  6:07 PM   Specimen: Urine, Clean Catch  Result Value Ref Range Status   Specimen Description   Final    URINE, CLEAN CATCH Performed at [facility], [place] [address], [place],  [address]  Special Requests   Final    NONE Performed at [facility], [place] [address] [place], [place] [address]    Culture (A)  Final    >=100,000 COLONIES/mL KLEBSIELLA PNEUMONIAE SUSCEPTIBILITIES TO FOLLOW Performed at [facility], [place] [address] [place], [place] [address]    Report Status PENDING  Incomplete  Blood culture (routine x 2)     Status: None (Preliminary result)   Collection Time: 08/01/19  8:12 PM   Specimen: BLOOD  Result Value Ref Range  Status   Specimen Description   Final    BLOOD RIGHT ANTECUBITAL Performed at [facility] [address], [place], [place] [address]    Special Requests   Final    BOTTLES DRAWN AEROBIC AND ANAEROBIC Blood Culture adequate volume Performed at [facility] [address] [place], [place] [address]    Culture  Setup Time   Final    GRAM POSITIVE COCCI IN CLUSTERS AEROBIC BOTTLE ONLY CRITICAL RESULT CALLED TO, READ BACK BY AND VERIFIED WITH: PHARMD DREW WOFFORD [id number] FCP    Culture   Final    GRAM POSITIVE COCCI IDENTIFICATION TO FOLLOW Performed at [facility], [place] [address], [place], [place] [address]    Report Status PENDING  Incomplete  Blood culture (routine x 2)     Status: None (Preliminary result)   Collection Time: 08/01/19  8:12 PM   Specimen: BLOOD  Result Value Ref Range Status   Specimen Description   Final    BLOOD LEFT ANTECUBITAL Performed at [place] [address], [place], [place] [address]    Special Requests   Final    BOTTLES DRAWN AEROBIC AND ANAEROBIC Blood Culture results may not be optimal due to an excessive volume of blood received in culture bottles Performed at [facility] [address] [place], [place] [address]    Culture  Setup Time ANAEROBIC BOTTLE ONLY NO ORGANISMS SEEN   Final   Culture   Final    NO GROWTH < 12 HOURS Performed at [facility], [place] [address], [place], [place] [address]    Report Status PENDING  Incomplete  SARS CORONAVIRUS 2 (TAT 6-24 HRS) Nasopharyngeal     Status: None   Collection Time: 08/01/19  8:12 PM   Specimen: Nasopharyngeal  Result Value Ref Range Status   SARS Coronavirus 2 NEGATIVE NEGATIVE Final    Comment: (NOTE) SARS-CoV-2 target nucleic acids are NOT DETECTED. The SARS-CoV-2 RNA is generally detectable in upper and lower respiratory specimens during the acute phase of infection. Negative results  do not preclude SARS-CoV-2 infection, do not rule out co-infections with other pathogens, and should not be used as the sole basis for treatment or other patient management decisions. Negative results must be combined with clinical observations, patient history, and epidemiological information. The expected result is Negative. Fact Sheet for Patients: SugarRoll.be Fact Sheet for Healthcare Providers: https://www.woods-mathews.com/ This test is not yet approved or cleared by the [place] FDA and  has been authorized for detection and/or diagnosis of SARS-CoV-2 by FDA under an Emergency Use Authorization (EUA). This EUA will remain  in effect (meaning this test can be used) for the duration of the COVID-19 declaration under Section 56 4(b)(1) of the Act, 21 U.S.C. section 360bbb-3(b)(1), unless the authorization is terminated or revoked sooner. Performed at [facility], [place] [address], [place], [place] [address]  Radiology Studies: CT HEAD WO CONTRAST  Result Date: 08/01/2019 CLINICAL DATA:  78 year old female with altered mental status. EXAM: CT HEAD WITHOUT CONTRAST TECHNIQUE: Contiguous axial images were obtained from the base of the skull through the vertex without intravenous contrast. COMPARISON:  Head CT dated 07/23/2019. FINDINGS: Brain: The ventricles and sulci appropriate size for patient's age. Mild periventricular and deep white matter chronic microvascular ischemic changes noted. There is no acute intracranial hemorrhage. No mass effect or midline shift. No extra-axial fluid collection. Vascular: No hyperdense vessel or unexpected calcification. Skull: Normal. Negative for fracture or focal lesion. Sinuses/Orbits: No acute finding. Other: None IMPRESSION: 1. No acute intracranial pathology. 2. Mild chronic microvascular ischemic changes. Electronically Signed   By: Anner Crete M.D.   On: 08/01/2019 19:16         Scheduled Meds: . allopurinol  150 mg Oral BID  . amLODipine  10 mg Oral Daily  . apixaban  5 mg Oral BID  . aspirin EC  81 mg Oral Daily  . fluticasone  1 spray Each Nare Daily  . metoprolol tartrate  50 mg Oral BID  . nystatin   Topical BID  . pantoprazole  40 mg Oral Daily  . pravastatin  20 mg Oral q1800  . QUEtiapine  12.5 mg Oral QHS   Continuous Infusions: . sodium chloride 50 mL/hr at 08/03/19 0948  . cefTRIAXone (ROCEPHIN)  IV 1 g (08/02/19 1937)     LOS: 2 days   Time spent= 25 mins    Elasia Furnish Arsenio Loader, MD [facility]  If 7PM-7AM, please contact night-coverage  08/03/2019, 11:25 AM

## 2019-08-03 NOTE — Evaluation (Signed)
Occupational Therapy Evaluation Patient Details Name: Erika [name] MRN: [id number] DOB: 1940/10/30 Today's Date: 08/03/2019    History of Present Illness 78 yo female admitted from SNF with AMS, weakness. Hx of morbid obesity, anemia, asthma, DDD, chronic back pain, myeloproliferative disease, SVT, A fib, psychosis, shingles   Clinical Impression   Pt admitted with AMS from SNF. Pt currently with functional limitations due to the deficits listed below (see OT Problem List).  Pt will benefit from skilled OT to increase their safety and independence with ADL and functional mobility for ADL to facilitate discharge to venue listed below.   Pt unable to stand and will likely need further rehab prior to DC back home.  Pt did well with lateral scoot transfer but not able to stand.  Pt very pleasant and willing to work with with therapy.     Follow Up Recommendations  SNF    Equipment Recommendations  Other (comment)(TBD next venue)    Recommendations for Other Services       Precautions / Restrictions Precautions Precautions: Fall Restrictions Weight Bearing Restrictions: No      Mobility Bed Mobility     Rolling: Min assist     Sit to supine: Mod assist   General bed mobility comments: Increased time. Pt relied on bedrails.  Transfers Overall transfer level: Needs assistance   Transfers: Sit to/from Stand;Lateral/Scoot Transfers          Lateral/Scoot Transfers: +2 safety/equipment;Mod assist      Balance Overall balance assessment: Needs assistance Sitting-balance support: Feet supported Sitting balance-Leahy Scale: Good                                     ADL either performed or assessed with clinical judgement   ADL Overall ADL's : Needs assistance/impaired Eating/Feeding: Set up;Sitting   Grooming: Set up;Sitting       Lower Body Bathing: Maximal assistance;Sitting/lateral leans;With adaptive equipment   Upper Body Dressing : Set  up;Sitting   Lower Body Dressing: Maximal assistance;Sitting/lateral leans                       Vision Patient Visual Report: No change from baseline              Pertinent Vitals/Pain Pain Assessment: No/denies pain     Hand Dominance Right   Extremity/Trunk Assessment         Cervical / Trunk Assessment Cervical / Trunk Exceptions: large body habitus   Communication Communication Communication: No difficulties   Cognition Arousal/Alertness: Awake/alert Behavior During Therapy: WFL for tasks assessed/performed Overall Cognitive Status: Within Functional Limits for tasks assessed                                                Home Living Family/patient expects to be discharged to:: Unsure   Available Help at Discharge: Family;Available PRN/intermittently Type of Home: Apartment Home Access: Level entry     Home Layout: One level     Bathroom Shower/Tub: Occupational psychologist: Standard     Home Equipment: Environmental consultant - 4 wheels   Additional Comments: lives with one son, second son comes by to help out, adult grandaughter helps with housecleaning. Both sons work during the day  Prior Functioning/Environment Level of Independence: Independent with assistive device(s)        Comments: rollator. drives herself to doctor appointments. son's do grocery shopping this year due to [place] exposure concerns.        OT Problem List: Decreased strength;Decreased activity tolerance;Impaired balance (sitting and/or standing);Decreased knowledge of use of DME or AE;Decreased knowledge of precautions;Obesity      OT Treatment/Interventions: Self-care/ADL training;Therapeutic exercise;Energy conservation;DME and/or AE instruction;Therapeutic activities;Patient/family education;Balance training    OT Goals(Current goals can be found in the care plan section) Acute Rehab OT Goals Patient Stated Goal: to be able to get OOB and  walk ADL Goals Pt Will Perform Lower Body Dressing: with min assist;sitting/lateral leans;with adaptive equipment Pt Will Transfer to Toilet: with min assist;bedside commode;squat pivot transfer Pt Will Perform Toileting - Clothing Manipulation and hygiene: with min assist;sitting/lateral leans  OT Frequency: Min 2X/week   Barriers to D/C: Decreased caregiver support             AM-PAC OT "6 Clicks" Daily Activity     Outcome Measure Help from another person eating meals?: None Help from another person taking care of personal grooming?: A Little Help from another person toileting, which includes using toliet, bedpan, or urinal?: Total Help from another person bathing (including washing, rinsing, drying)?: A Lot Help from another person to put on and taking off regular upper body clothing?: A Lot Help from another person to put on and taking off regular lower body clothing?: Total 6 Click Score: 13   End of Session Nurse Communication: Mobility status  Activity Tolerance: Patient tolerated treatment well Patient left: in bed;with call bell/phone within reach;with SCD's reapplied;with bed alarm set  OT Visit Diagnosis: Unsteadiness on feet (R26.81);Muscle weakness (generalized) (M62.81);Pain                Time: 0215-0256 OT Time Calculation (min): 41 min Charges:  OT General Charges $OT Visit: 1 Visit OT Evaluation $OT Eval Moderate Complexity: 1 Mod OT Treatments $Self Care/Home Management : 23-37 mins  Kari Baars, OT Acute Rehabilitation Services [phone] Office- [phone], Edwena Felty D 08/03/2019, 4:02 PM

## 2019-08-04 LAB — CBC
HCT: 29 % — ABNORMAL LOW (ref 36.0–46.0)
Hemoglobin: 9 g/dL — ABNORMAL LOW (ref 12.0–15.0)
MCH: 30.9 pg (ref 26.0–34.0)
MCHC: 31 g/dL (ref 30.0–36.0)
MCV: 99.7 fL (ref 80.0–100.0)
Platelets: 154 10*3/uL (ref 150–400)
RBC: 2.91 MIL/uL — ABNORMAL LOW (ref 3.87–5.11)
RDW: 15.6 % — ABNORMAL HIGH (ref 11.5–15.5)
WBC: 103.1 10*3/uL (ref 4.0–10.5)
nRBC: 0 % (ref 0.0–0.2)

## 2019-08-04 LAB — BASIC METABOLIC PANEL
Anion gap: 11 (ref 5–15)
BUN: 23 mg/dL (ref 8–23)
CO2: 21 mmol/L — ABNORMAL LOW (ref 22–32)
Calcium: 8.9 mg/dL (ref 8.9–10.3)
Chloride: 101 mmol/L (ref 98–111)
Creatinine, Ser: 0.91 mg/dL (ref 0.44–1.00)
GFR calc Af Amer: >60 mL/min (ref >60)
GFR calc non Af Amer: >60 mL/min (ref >60)
Glucose, Bld: 67 mg/dL — ABNORMAL LOW (ref 70–99)
Potassium: 4.2 mmol/L (ref 3.5–5.1)
Sodium: 133 mmol/L — ABNORMAL LOW (ref 135–145)

## 2019-08-04 LAB — URINE CULTURE: Culture: >=100000 — AB

## 2019-08-04 LAB — MAGNESIUM: Magnesium: 1.8 mg/dL (ref 1.7–2.4)

## 2019-08-04 MED ORDER — PRO-STAT SUGAR FREE PO LIQD: 30.0000 mL | Freq: Every day | ORAL | 0 refills | Status: DC

## 2019-08-04 MED ORDER — CEPHALEXIN 500 MG PO CAPS
500.0000 mg | ORAL_CAPSULE | Freq: Three times a day (TID) | ORAL | Status: DC
  Administered 2019-08-04 – 2019-08-05 (×3): 500 mg via ORAL
  Filled 2019-08-04 (×3): qty 1

## 2019-08-04 MED ORDER — ENSURE MAX PROTEIN PO LIQD: 11.0000 [oz_av] | Freq: Two times a day (BID) | ORAL | Status: AC

## 2019-08-04 MED ORDER — CEPHALEXIN 500 MG PO CAPS: 500.0000 mg | ORAL_CAPSULE | Freq: Three times a day (TID) | ORAL | Status: AC

## 2019-08-04 NOTE — Care Management Important Message (Signed)
Important Message  Patient Details IM Letter given to Marney Doctor RN to present to the Patient Name: Erika [name] MRN: [id number] Date of Birth: [birth year]   Medicare Important Message Given:  Yes     Kerin Salen 08/04/2019, 10:54 AM

## 2019-08-04 NOTE — TOC Transition Note (Addendum)
Transition of Care [facility]) - CM/SW Discharge Note   Patient Details  Name: Erika [name] MRN: [id number] Date of Birth: 1941/01/12  [facility] of [facility] CM/SW Contact:  Lynnell Catalan, RN Phone Number: 08/04/2019, 2:27 PM   Clinical Narrative:    Pt from [facility] for rehab and to return there at discharge. This CM submitted auth request for SNF at [place] for PTAR and SNF. Son called to inform of dc to [facility]. PT[facility] to transport.  Addendum 1533: This CM contacted [facility] again to check on auth status for SNF. Liaison stated that it was in med review and would not be completed today. MD and RN made aware.      Readmission Risk Interventions Readmission Risk Prevention Plan 08/04/2019  Transportation Screening Complete  PCP or Specialist Appt within 3-5 Days Complete  HRI or [place] Complete  Social Work Consult for [place] Planning/Counseling Complete  Palliative Care Screening Not Applicable  Medication Review Press photographer) Complete  Some recent data might be hidden

## 2019-08-04 NOTE — Discharge Summary (Signed)
Physician Discharge Summary  [name]:[id number] DOB: 1940-09-03 DOA: 08/01/2019  PCP: [name], MD  Admit date: 08/01/2019 Discharge date: 08/04/2019  Admitted From: [facility] Disposition:  [place]   Recommendations for Outpatient Follow-up:  1. Follow up with PCP in 1-2 weeks after discharge from rehab 2. Please obtain BMP in one week 3. Please provide at least 48 oz water per day  Home Health: N/A  Equipment/Devices: TBD at SNF  Discharge Condition: Fair  CODE STATUS: FULL Diet recommendation: Regular  Brief/Interim Summary: Erika [name] is a 78 y.o. F with a myeloproliferative neoplasm NOS, followed by Dr. Irene Limbo, baseline neutrophils ~90-100K, MO, A. fib on Eliquis, HTN, asthma, chronic obesity, and SVT who presented with confusion.  Found to have creatinine 1.9, pyuria.  Started on fluids and antibiotics.   PRINCIPAL HOSPITAL DIAGNOSIS: Acute metabolic encephalopathy due to dehydration    Discharge Diagnoses:   Acute metabolic encephalopathy Secondary to dehydration and UTI.  Started on ceftriaxone and IV fluids, mentation resolved to normal.  Urine culture growing Klebsiella, cefazolin susceptible.  Transition to Keflex to complete 5 days.  Encouraged to drink 48-64 ounces water per day.  AKI Patient baseline creatinine is 0.9, no chronic kidney disease.  At admission she had creatinine 2.02, which improved over 72 hours with IV fluids.  Hypertension  Myeloproliferative neoplasm NOS I discussed with hematology on-call, who reviewed her laboratory studies, and recommended no further work-up.  Given her neutrophils, sludging and vascular occlusion is unlikely.  Morbid obesity, BMI 43  Paroxysmal atrial fibrillation        Discharge Instructions  Discharge Instructions    Diet - low sodium heart healthy   Complete by: As directed    Discharge instructions   Complete by: As directed    You were admitted for confusion We  found this was from dehydration and urinary tract infection You improved with fluids and antibiotics  Make sure to drink six 8oz glasses of water daily Take cephalexin 500 mg three times daily for 2 more days (Dec 29 and Dec 30)  Follow up with Dr. Daryll Drown in 1 week after discharge from rehab   Increase activity slowly   Complete by: As directed      Allergies as of 08/04/2019      Reactions   Prednisone Other (See Comments)   REACTION: Psychosis, talking out of head, insomnia      Medication List    TAKE these medications   Advair Diskus 500-50 MCG/DOSE Aepb Generic drug: Fluticasone-Salmeterol INHALE 1 PUFF BY MOUTH TWICE DAILY What changed:   how much to take  how to take this  when to take this  additional instructions   albuterol 108 (90 Base) MCG/ACT inhaler Commonly known as: VENTOLIN HFA INHALE 1 TO 2 PUFFS BY MOUTH EVERY 6 HOURS AS NEEDED What changed: See the new instructions.   allopurinol 300 MG tablet Commonly known as: ZYLOPRIM Take 0.5 tablets (150 mg total) by mouth 2 (two) times daily.   amLODipine 10 MG tablet Commonly known as: NORVASC Take 1 tablet (10 mg total) by mouth daily.   apixaban 5 MG Tabs tablet Commonly known as: Eliquis Take 1 tablet (5 mg total) by mouth 2 (two) times daily.   aspirin EC 81 MG tablet Take 81 mg by mouth daily.   cephALEXin 500 MG capsule Commonly known as: KEFLEX Take 1 capsule (500 mg total) by mouth every 8 (eight) hours for 2 days.   diphenhydrAMINE 25  MG tablet Commonly known as: BENADRYL Take 25 mg by mouth as needed. Once a day prn   Ensure Max Protein Liqd Take 330 mLs (11 oz total) by mouth 2 (two) times daily.   feeding supplement (PRO-STAT SUGAR FREE 64) Liqd Take 30 mLs by mouth daily.   fluticasone 50 MCG/ACT nasal spray Commonly known as: FLONASE SHAKE LIQUID AND USE 1 SPRAY IN EACH NOSTRIL DAILY What changed: See the new instructions.   furosemide 20 MG tablet Commonly known as:  LASIX TAKE 1 TABLET(20 MG) BY MOUTH DAILY AS NEEDED FOR EDEMA What changed: See the new instructions.   metoprolol tartrate 50 MG tablet Commonly known as: LOPRESSOR Take 1 tablet (50 mg total) by mouth 2 (two) times daily.   nystatin powder Commonly known as: MYCOSTATIN/NYSTOP Apply topically 2 (two) times daily.   omeprazole 20 MG capsule Commonly known as: PRILOSEC TAKE 1 CAPSULE BY MOUTH EVERY DAY What changed: how much to take   pravastatin 20 MG tablet Commonly known as: PRAVACHOL Take 1 tablet (20 mg total) by mouth daily.   QUEtiapine 25 MG tablet Commonly known as: SEROQUEL Take 0.5 tablets (12.5 mg total) by mouth 2 (two) times daily as needed (agitation). What changed: Another medication with the same name was changed. Make sure you understand how and when to take each.   QUEtiapine 25 MG tablet Commonly known as: SEROQUEL Take 1 tablet (25 mg total) by mouth at bedtime. What changed: how much to take   sodium chloride 0.65 % Soln nasal spray Commonly known as: OCEAN Place 1 spray into both nostrils as needed for congestion.   tiZANidine 4 MG tablet Commonly known as: ZANAFLEX Take 1 tablet (4 mg total) by mouth every 8 (eight) hours as needed for muscle spasms.       Allergies  Allergen Reactions  . Prednisone Other (See Comments)    REACTION: Psychosis, talking out of head, insomnia    Consultations:     Procedures/Studies: DG Chest 2 View  Result Date: 07/12/2019 CLINICAL DATA:  Patient with abnormalities on previous chest x-ray. EXAM: CHEST - 2 VIEW COMPARISON:  Chest x-ray 07/10/2019 FINDINGS: Marked cardiac enlargement similar to prior study accounting for decreased lung volume seen on the current exam. Hilar fullness is accentuated also by low depth of expansion. Basilar opacities are noted bilaterally. Interstitial markings are increased with pulmonary vascular congestion. In the right lung base at the site previously obscured by cardiac lead  is a nodular density with rounded morphology. This measures approximately 2.3 cm. IMPRESSION: 1. Stable marked cardiac enlargement with pulmonary vascular congestion. Question mild edema with diminishing lung volumes, consider correlation with signs of heart failure or volume overload. 2. Basilar opacities may represent atelectasis or infection. 3. Area of density with rounded morphology in the right lower lobe along with right hilar fullness. Consider CT of the chest when the patient is able for further assessment to exclude nodule with hilar adenopathy. Electronically Signed   By: Zetta Bills M.D.   On: 07/12/2019 17:56   Chest Xray 2 View  Result Date: 07/10/2019 CLINICAL DATA:  Wheezing EXAM: CHEST - 2 VIEW COMPARISON:  05/29/2017 FINDINGS: Normal cardiomediastinal silhouette without pneumothorax or pleural effusion. Patchy airspace opacities over the posterior lung bases on lateral view. IMPRESSION: 1. Suspect posterior lower lobe infiltrates on the lateral view. 2. Mild cardiomegaly Electronically Signed   By: Donavan Foil M.D.   On: 07/10/2019 22:53   CT HEAD WO CONTRAST  Result Date: 08/01/2019  CLINICAL DATA:  78 year old female with altered mental status. EXAM: CT HEAD WITHOUT CONTRAST TECHNIQUE: Contiguous axial images were obtained from the base of the skull through the vertex without intravenous contrast. COMPARISON:  Head CT dated 07/23/2019. FINDINGS: Brain: The ventricles and sulci appropriate size for patient's age. Mild periventricular and deep white matter chronic microvascular ischemic changes noted. There is no acute intracranial hemorrhage. No mass effect or midline shift. No extra-axial fluid collection. Vascular: No hyperdense vessel or unexpected calcification. Skull: Normal. Negative for fracture or focal lesion. Sinuses/Orbits: No acute finding. Other: None IMPRESSION: 1. No acute intracranial pathology. 2. Mild chronic microvascular ischemic changes. Electronically Signed    By: Anner Crete M.D.   On: 08/01/2019 19:16   CT HEAD WO CONTRAST  Result Date: 07/23/2019 CLINICAL DATA:  Encephalopathy. Cerebral hemorrhage. Aneurysm suspected. EXAM: CT HEAD WITHOUT CONTRAST TECHNIQUE: Contiguous axial images were obtained from the base of the skull through the vertex without intravenous contrast. COMPARISON:  02/17/2008 FINDINGS: Brain: Mild generalized atrophy. Mild small vessel change of the hemispheric white matter. No sign of acute infarction, mass lesion, hemorrhage, hydrocephalus or extra-axial collection. Vascular: There is atherosclerotic calcification of the major vessels at the base of the brain. Skull: Negative Sinuses/Orbits: Clear/normal Other: None IMPRESSION: No acute or reversible finding. Mild age related volume loss. Minimal small vessel change of the hemispheric white matter. Electronically Signed   By: Nelson Chimes M.D.   On: 07/23/2019 17:10   CT PELVIS WO CONTRAST  Result Date: 07/10/2019 CLINICAL DATA:  Hip pain, chronic possible lytic lesion left sacral ala. EXAM: CT PELVIS WITHOUT CONTRAST TECHNIQUE: Multidetector CT imaging of the pelvis was performed following the standard protocol without intravenous contrast. COMPARISON:  Plain film study of the same date., bone biopsy images of 06/24/2017. FINDINGS: Urinary Tract:  Choose 1 Bowel: No signs of acute bowel process within visualized portions of the bowel. Vascular/Lymphatic: Not well evaluated given lack of intravenous contrast. Signs of calcific atherosclerotic change throughout the distal abdominal aorta and the iliac vasculature. Reproductive:  Calcified fibroid in the uterine fundus. Other:  No signs of ascites. Musculoskeletal: Signs of bony sclerosis about the left sacroiliac joint, now associated with more bony erosion and indistinct margins than on the previous exam. Widening of the joint since the prior exam. Subtle stranding along the anterior margin of the joint becoming more pronounced  inferiorly. The hips are located bilaterally. IMPRESSION: 1. Signs of bony sclerosis about the left sacroiliac joint, now associated with more bony erosion and indistinct margins than on the previous exam. Findings are concerning for septic arthritis versus active sacroiliitis. No signs of fracture. Small cortical 2. Signs of calcific atherosclerotic change throughout the distal abdominal aorta and iliac vasculature. 3. Calcified fibroid in the uterine fundus. 4. Aortic atherosclerosis. Aortic Atherosclerosis (ICD10-I70.0). Electronically Signed   By: Zetta Bills M.D.   On: 07/10/2019 19:02   MR Lumbar Spine W Wo Contrast  Result Date: 07/15/2019 CLINICAL DATA:  Low back pain with left leg weakness. Possible myeloproliferative disorder. EXAM: MRI LUMBAR SPINE WITHOUT AND WITH CONTRAST TECHNIQUE: Multiplanar and multiecho pulse sequences of the lumbar spine were obtained without and with intravenous contrast. CONTRAST:  28m GADAVIST GADOBUTROL 1 MMOL/ML IV SOLN COMPARISON:  None. FINDINGS: Segmentation:  Standard. Alignment:  Physiologic. Vertebrae:  No fracture, evidence of discitis, or bone lesion. Conus medullaris and cauda equina: Conus extends to the L2 level. Conus and cauda equina appear normal. Paraspinal and other soft tissues: Negative Disc levels: Visualized lower  thoracic disc spaces are normal. T12-L1: Normal disc space and facets. No spinal canal or neuroforaminal stenosis. L1-L2: Normal disc space and facets. No spinal canal or neuroforaminal stenosis. L2-L3: Normal disc space and facets. No spinal canal or neuroforaminal stenosis. L3-L4: Small disc bulge with mild spinal canal narrowing. No neural foraminal stenosis. L4-L5: Remote left laminectomy. Mild disc bulge. Mild bilateral foraminal narrowing, unchanged. No spinal canal stenosis. L5-S1: Remote left laminectomy. No spinal canal or neural foraminal stenosis. Unchanged. Visualized sacrum: Normal. IMPRESSION: 1. Unchanged examination with  mild bilateral L4-L5 neural foraminal stenosis. 2. No spinal canal or neural foraminal stenosis. 3. Remote left laminectomy at L4-L5 and L5-S1. Electronically Signed   By: Ulyses Jarred M.D.   On: 07/15/2019 22:26   MR HIP LEFT W WO CONTRAST  Result Date: 07/16/2019 CLINICAL DATA:  Left hip pain.  Pain and weakness. EXAM: MRI OF THE LEFT HIP WITHOUT AND WITH CONTRAST TECHNIQUE: Multiplanar, multisequence MR imaging was performed both before and after administration of intravenous contrast. CONTRAST:  33m GADAVIST GADOBUTROL 1 MMOL/ML IV SOLN COMPARISON:  None. FINDINGS: Bones: No hip fracture, dislocation or avascular necrosis. No periosteal reaction or bone destruction. No aggressive osseous lesion. Joint space narrowing and tiny marginal osteophytes of the right SI joint consistent with mild osteoarthritis. Small right SI joint effusion. Moderate left SI joint effusion with subchondral marrow edema and sclerosis in as well as articular surface irregularity and mild adjacent soft tissue edema. Post biopsy changes in the Degenerative disc disease with disc height loss at L4-5 and L5-S1. Articular cartilage and labrum Articular cartilage:  No chondral defect. Labrum: Grossly intact, but evaluation is limited by lack of intraarticular fluid. Joint or bursal effusion Joint effusion: Small bilateral hip joint effusion. Moderate left SI joint effusion. Mild right SI joint effusion. Bursae: Moderate amount of fluid in the greater trochanteric bursa bilaterally. Muscles and tendons Flexors: Normal. Extensors: Normal. Abductors: Normal. Adductors: Normal. Gluteals: Normal. Hamstrings: Partial-thickness tear of the left hamstring origin. Other findings Miscellaneous: No pelvic free fluid. No fluid collection or hematoma. No inguinal lymphadenopathy. No inguinal hernia. Small 10 mm uterine fibroid is noted. IMPRESSION: 1. Moderate left SI joint effusion with subchondral marrow edema and sclerosis in as well as articular  surface irregularity and mild adjacent soft tissue edema. Given the asymmetry and additional findings this appearance can be seen with septic arthritis. Asymmetric bilateral sacroiliitis can be seen with osteoarthritis, reactive arthritis, crystalline arthropathy, but given the overall appearance septic arthritis should be excluded. 2. 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 3. Bilateral greater trochanteric bursitis. Electronically Signed   By: HKathreen Devoid  On: 07/16/2019 08:21   IR Fluoro Guide Ndl Plmt / BX  Result Date: 07/17/2019 CLINICAL DATA:  Left hip pain. MR suggests left sacroiliitis, possibly septic arthritis PROCEDURE: LEFT SACROILIAC JOINT ASPIRATION UNDER FLUOROSCOPY TECHNIQUE: The procedure, risks (including but not limited to bleeding, infection, organ damage ), benefits, and alternatives were explained to the patient. Questions regarding the procedure were encouraged and answered. The patient understands and consents to the procedure. patient placed prone on the fluoroscopy table. Operator donned sterile gloves and mask. A posterior approach was taken using a curved 20 gauge spinal needle. A scant amount of bloody fluid less than 1 mL could be aspirated from the joint despite vigorous aspiration and changes in needle position. The sample was sent for Gram stain and culture. The procedure was well-tolerated. FLUOROSCOPY TIME:  0.6 minute; 612 uGym2 DAP IMPRESSION: Technically successful left  sacro-iliac joint aspiration under fluoroscopy Electronically Signed   By: Lucrezia Europe M.D.   On: 07/17/2019 13:46   CT BIOPSY  Result Date: 07/13/2019 INDICATION: History of MPN and CHEK 2 deletion now with progression of new anemia, thrombocytopenia and leukocytosis. Please perform CT-guided bone marrow biopsy for tissue diagnostic purposes. Note, questioned left-sided sacroiliitis with identified on recent pelvic CT and as such the decision was made to proceed with bone marrow biopsy of the right  iliac crest. EXAM: CT-GUIDED BONE MARROW BIOPSY AND ASPIRATION MEDICATIONS: None ANESTHESIA/SEDATION: Fentanyl 150 mcg IV; Versed 3 mg IV Sedation Time: 10 Minutes; The patient was continuously monitored during the procedure by the interventional radiology nurse under my direct supervision. COMPLICATIONS: None immediate. PROCEDURE: Informed consent was obtained from the patient following an explanation of the procedure, risks, benefits and alternatives. The patient understands, agrees and consents for the procedure. All questions were addressed. A time out was performed prior to the initiation of the procedure. The patient was positioned prone and non-contrast localization CT was performed of the pelvis to demonstrate the iliac marrow spaces. The operative site was prepped and draped in the usual sterile fashion. Under sterile conditions and local anesthesia, a 22 gauge spinal needle was utilized for procedural planning. Next, an 11 gauge coaxial bone biopsy needle was advanced into the right iliac marrow space. Needle position was confirmed with CT imaging. Initially, bone marrow aspiration was performed. Next, a bone marrow biopsy was obtained with the 11 gauge outer bone marrow device. Samples were prepared with the cytotechnologist and deemed adequate. The needle was removed intact. Hemostasis was obtained with compression and a dressing was placed. The patient tolerated the procedure well without immediate post procedural complication. IMPRESSION: Successful CT guided right iliac bone marrow aspiration and core biopsy. Electronically Signed   By: Sandi Mariscal M.D.   On: 07/13/2019 11:06   DG CHEST PORT 1 VIEW  Result Date: 07/23/2019 CLINICAL DATA:  Pneumonia EXAM: PORTABLE CHEST 1 VIEW COMPARISON:  07/14/2019 FINDINGS: Cardiomegaly. Hilar prominence noted which could be related to vascular congestion, but cannot exclude adenopathy. Particularly in the right hilum. No confluent opacities or effusions. No  edema. No acute bony abnormality. IMPRESSION: Cardiomegaly. Bilateral hilar prominence, particularly on the right. Cannot exclude adenopathy. This could be further evaluated with contrast-enhanced chest CT if felt clinically indicated. Electronically Signed   By: Rolm Baptise M.D.   On: 07/23/2019 19:32   DG CHEST PORT 1 VIEW  Result Date: 07/14/2019 CLINICAL DATA:  Pulmonary nodule. EXAM: PORTABLE CHEST 1 VIEW COMPARISON:  Radiograph 07/12/2019. CT 05/31/2017 FINDINGS: Unchanged cardiomegaly. Unchanged mediastinal contours. The rounded density at the right lung base on prior radiograph is not seen currently. Pulmonary vascular congestion is similar to prior. Streaky bibasilar atelectasis. No new airspace disease or pneumothorax. No large pleural effusion. IMPRESSION: 1. Unchanged cardiomegaly and pulmonary vascular congestion. 2. The rounded density at the right lung base on prior radiograph is not seen currently. 3. Streaky bibasilar atelectasis. No new abnormality. Electronically Signed   By: Keith Rake M.D.   On: 07/14/2019 05:41   CT BONE MARROW BIOPSY & ASPIRATION  Result Date: 07/13/2019 INDICATION: History of MPN and CHEK 2 deletion now with progression of new anemia, thrombocytopenia and leukocytosis. Please perform CT-guided bone marrow biopsy for tissue diagnostic purposes. Note, questioned left-sided sacroiliitis with identified on recent pelvic CT and as such the decision was made to proceed with bone marrow biopsy of the right iliac crest. EXAM: CT-GUIDED BONE MARROW  BIOPSY AND ASPIRATION MEDICATIONS: None ANESTHESIA/SEDATION: Fentanyl 150 mcg IV; Versed 3 mg IV Sedation Time: 10 Minutes; The patient was continuously monitored during the procedure by the interventional radiology nurse under my direct supervision. COMPLICATIONS: None immediate. PROCEDURE: Informed consent was obtained from the patient following an explanation of the procedure, risks, benefits and alternatives. The patient  understands, agrees and consents for the procedure. All questions were addressed. A time out was performed prior to the initiation of the procedure. The patient was positioned prone and non-contrast localization CT was performed of the pelvis to demonstrate the iliac marrow spaces. The operative site was prepped and draped in the usual sterile fashion. Under sterile conditions and local anesthesia, a 22 gauge spinal needle was utilized for procedural planning. Next, an 11 gauge coaxial bone biopsy needle was advanced into the right iliac marrow space. Needle position was confirmed with CT imaging. Initially, bone marrow aspiration was performed. Next, a bone marrow biopsy was obtained with the 11 gauge outer bone marrow device. Samples were prepared with the cytotechnologist and deemed adequate. The needle was removed intact. Hemostasis was obtained with compression and a dressing was placed. The patient tolerated the procedure well without immediate post procedural complication. IMPRESSION: Successful CT guided right iliac bone marrow aspiration and core biopsy. Electronically Signed   By: Sandi Mariscal M.D.   On: 07/13/2019 11:06   DG Hip Unilat W or Wo Pelvis 2-3 Views Left  Result Date: 07/10/2019 CLINICAL DATA:  Left-sided hip pain EXAM: DG HIP (WITH OR WITHOUT PELVIS) 2-3V LEFT COMPARISON:  CT 05/29/2017 FINDINGS: No fracture or malalignment. Mild joint space narrowing. Pubic symphysis and rami appear intact. Questionable lytic change involving the left sacral ala. IMPRESSION: 1. No acute osseous abnormality of left hip. Mild degenerative change 2. Questionable lytic process involving the left sacral ala. CT could be obtained for further evaluation. Electronically Signed   By: Donavan Foil M.D.   On: 07/10/2019 17:34       Subjective: Feeling well.  No confusion, dysuria, hematuria, abdominal pain, palpitations, chest discomfort, dyspnea.  Discharge Exam: Vitals:   08/03/19 2123 08/04/19 0636   BP: 130/62 (!) 105/57  Pulse: 84 87  Resp: 20 20  Temp: 98.3 F (36.8 C) 98.1 F (36.7 C)  SpO2: 94% 95%   Vitals:   08/03/19 1322 08/03/19 1650 08/03/19 2123 08/04/19 0636  BP: (!) 116/53 112/63 130/62 (!) 105/57  Pulse: 80 81 84 87  Resp:  17 20 20   Temp: 97.9 F (36.6 C) 98.6 F (37 C) 98.3 F (36.8 C) 98.1 F (36.7 C)  TempSrc: Oral Oral Oral Oral  SpO2: 95% 97% 94% 95%  Weight:      Height:        General: Pt is alert, awake, not in acute distress Cardiovascular: RRR, nl S1-S2, no murmurs appreciated.   No LE edema.   Respiratory: Normal respiratory rate and rhythm.  CTAB without rales or wheezes. Abdominal: Abdomen soft and non-tender.  No distension or HSM.   Neuro/Psych: Strength symmetric in upper and lower extremities.  Judgment and insight appear normal.   The results of significant diagnostics from this hospitalization (including imaging, microbiology, ancillary and laboratory) are listed below for reference.     Microbiology: Recent Results (from the past 240 hour(s))  SARS CORONAVIRUS 2 (TAT 6-24 HRS) Nasopharyngeal Nasopharyngeal Swab     Status: None   Collection Time: 07/27/19  1:34 PM   Specimen: Nasopharyngeal Swab  Result Value Ref Range Status  SARS Coronavirus 2 NEGATIVE NEGATIVE Final    Comment: (NOTE) SARS-CoV-2 target nucleic acids are NOT DETECTED. The SARS-CoV-2 RNA is generally detectable in upper and lower respiratory specimens during the acute phase of infection. Negative results do not preclude SARS-CoV-2 infection, do not rule out co-infections with other pathogens, and should not be used as the sole basis for treatment or other patient management decisions. Negative results must be combined with clinical observations, patient history, and epidemiological information. The expected result is Negative. Fact Sheet for Patients: SugarRoll.be Fact Sheet for Healthcare  Providers: https://www.woods-mathews.com/ This test is not yet approved or cleared by the [place] FDA and  has been authorized for detection and/or diagnosis of SARS-CoV-2 by FDA under an Emergency Use Authorization (EUA). This EUA will remain  in effect (meaning this test can be used) for the duration of the COVID-19 declaration under Section 56 4(b)(1) of the Act, 21 U.S.C. section 360bbb-3(b)(1), unless the authorization is terminated or revoked sooner. Performed at [facility], [place] [address] [place], [place] [address]   Urine culture     Status: Abnormal   Collection Time: 08/01/19  6:07 PM   Specimen: Urine, Clean Catch  Result Value Ref Range Status   Specimen Description   Final    URINE, CLEAN CATCH Performed at [facility], [place] [address], [place], [place] [address]    Special Requests   Final    NONE Performed at [facility], [place] [address] [address] [place], [place] [address]    Culture >=100,000 COLONIES/mL KLEBSIELLA PNEUMONIAE (A)  Final   Report Status 08/04/2019 FINAL  Final   Organism ID, Bacteria KLEBSIELLA PNEUMONIAE (A)  Final      Susceptibility   Klebsiella pneumoniae - MIC*    AMPICILLIN >=32 RESISTANT Resistant     CEFAZOLIN <=4 SENSITIVE Sensitive     CEFTRIAXONE <=1 SENSITIVE Sensitive     CIPROFLOXACIN <=0.25 SENSITIVE Sensitive     GENTAMICIN <=1 SENSITIVE Sensitive     IMIPENEM <=0.25 SENSITIVE Sensitive     NITROFURANTOIN 64 INTERMEDIATE Intermediate     TRIMETH/SULFA <=20 SENSITIVE Sensitive     AMPICILLIN/SULBACTAM >=32 RESISTANT Resistant     PIP/TAZO 32 INTERMEDIATE Intermediate     * >=100,000 COLONIES/mL KLEBSIELLA PNEUMONIAE  Blood culture (routine x 2)     Status: Abnormal   Collection Time: 08/01/19  8:12 PM   Specimen: BLOOD  Result Value Ref Range Status   Specimen Description   Final    BLOOD RIGHT ANTECUBITAL Performed at [facility] [address], [place], [place] [address]    Special Requests   Final    BOTTLES DRAWN AEROBIC AND ANAEROBIC Blood Culture adequate volume Performed at [facility] [address], [place], [place] [address]    Culture  Setup Time   Final    GRAM POSITIVE COCCI IN CLUSTERS AEROBIC BOTTLE ONLY CRITICAL RESULT CALLED TO, READ BACK BY AND VERIFIED WITH: PHARMD DREW WOFFORD [id number] FCP    Culture (A)  Final    STAPHYLOCOCCUS SPECIES (COAGULASE NEGATIVE) THE SIGNIFICANCE OF ISOLATING THIS ORGANISM FROM A SINGLE SET OF BLOOD CULTURES WHEN MULTIPLE SETS ARE DRAWN IS UNCERTAIN. PLEASE NOTIFY THE MICROBIOLOGY DEPARTMENT WITHIN ONE WEEK IF SPECIATION AND SENSITIVITIES ARE REQUIRED. Performed at [facility], [place] [address] [place], [place] [address]    Report Status 08/03/2019 FINAL  Final  Blood culture (routine x 2)     Status: None (  Preliminary result)   Collection Time: 08/01/19  8:12 PM   Specimen: BLOOD  Result Value Ref Range Status   Specimen Description   Final    BLOOD LEFT ANTECUBITAL Performed at [facility] [address], [place], [place] [address]    Special Requests   Final    BOTTLES DRAWN AEROBIC AND ANAEROBIC Blood Culture results may not be optimal due to an excessive volume of blood received in culture bottles Performed at [facility] [address] [place], [place] [address]    Culture  Setup Time PENDING  Incomplete   Culture   Final    NO GROWTH 2 DAYS Performed at [facility], [place] [address] [place], [place] [address]    Report Status PENDING  Incomplete  SARS CORONAVIRUS 2 (TAT 6-24 HRS) Nasopharyngeal     Status: None   Collection Time: 08/01/19  8:12 PM   Specimen: Nasopharyngeal  Result Value Ref Range Status   SARS Coronavirus 2 NEGATIVE NEGATIVE Final    Comment: (NOTE) SARS-CoV-2 target nucleic acids are NOT DETECTED. The SARS-CoV-2 RNA is generally detectable in upper and  lower respiratory specimens during the acute phase of infection. Negative results do not preclude SARS-CoV-2 infection, do not rule out co-infections with other pathogens, and should not be used as the sole basis for treatment or other patient management decisions. Negative results must be combined with clinical observations, patient history, and epidemiological information. The expected result is Negative. Fact Sheet for Patients: SugarRoll.be Fact Sheet for Healthcare Providers: https://www.woods-mathews.com/ This test is not yet approved or cleared by the [place] FDA and  has been authorized for detection and/or diagnosis of SARS-CoV-2 by FDA under an Emergency Use Authorization (EUA). This EUA will remain  in effect (meaning this test can be used) for the duration of the COVID-19 declaration under Section 56 4(b)(1) of the Act, 21 U.S.C. section 360bbb-3(b)(1), unless the authorization is terminated or revoked sooner. Performed at [facility], [place] [address], [place], [place] [address]      Labs: BNP (last 3 results) No results for input(s): BNP in the last 8760 hours. Basic Metabolic Panel: Recent Labs  Lab 08/01/19 1807 08/02/19 0537 08/04/19 0501  NA 129* 136 133*  K 4.7 4.1 4.2  CL 95* 97* 101  CO2 23 24 21*  GLUCOSE 89 51* 67*  BUN 28* 29* 23  CREATININE 2.02* 1.50* 0.91  CALCIUM 8.9 8.4* 8.9  MG  --   --  1.8   Liver Function Tests: Recent Labs  Lab 08/01/19 1807 08/02/19 0537  AST 21 19  ALT 14 13  ALKPHOS 268* 231*  BILITOT 0.4 0.5  PROT 8.5* 7.6  ALBUMIN 3.7 3.3*   No results for input(s): LIPASE, AMYLASE in the last 168 hours. No results for input(s): AMMONIA in the last 168 hours. CBC: Recent Labs  Lab 08/01/19 1807 08/02/19 0537 08/04/19 0501  WBC 98.8* 76.7* 103.1*  NEUTROABS 94.8*  --   --   HGB 11.0* 9.0* 9.0*  HCT 34.4* 28.9* 29.0*  MCV 97.5 98.6 99.7  PLT 215 194 154    Cardiac Enzymes: No results for input(s): CKTOTAL, CKMB, CKMBINDEX, TROPONINI in the last 168 hours. BNP: Invalid input(s): POCBNP CBG: Recent Labs  Lab 08/01/19 1805 08/03/19 1125  GLUCAP 77 87   D-Dimer No results for input(s): DDIMER in the last 72 hours. Hgb A1c No results for input(s): HGBA1C in the last 72 hours. Lipid  Profile No results for input(s): CHOL, HDL, LDLCALC, TRIG, CHOLHDL, LDLDIRECT in the last 72 hours. Thyroid function studies No results for input(s): TSH, T4TOTAL, T3FREE, THYROIDAB in the last 72 hours.  Invalid input(s): FREET3 Anemia work up No results for input(s): VITAMINB12, FOLATE, FERRITIN, TIBC, IRON, RETICCTPCT in the last 72 hours. Urinalysis    Component Value Date/Time   COLORURINE AMBER (A) 08/01/2019 1807   APPEARANCEUR CLOUDY (A) 08/01/2019 1807   LABSPEC 1.023 08/01/2019 1807   PHURINE 5.0 08/01/2019 1807   GLUCOSEU NEGATIVE 08/01/2019 1807   HGBUR MODERATE (A) 08/01/2019 1807   BILIRUBINUR SMALL (A) 08/01/2019 1807   KETONESUR NEGATIVE 08/01/2019 1807   PROTEINUR 100 (A) 08/01/2019 1807   UROBILINOGEN 1.0 09/09/2012 0051   NITRITE NEGATIVE 08/01/2019 1807   LEUKOCYTESUR LARGE (A) 08/01/2019 1807   Sepsis Labs Invalid input(s): PROCALCITONIN,  WBC,  LACTICIDVEN Microbiology Recent Results (from the past 240 hour(s))  SARS CORONAVIRUS 2 (TAT 6-24 HRS) Nasopharyngeal Nasopharyngeal Swab     Status: None   Collection Time: 07/27/19  1:34 PM   Specimen: Nasopharyngeal Swab  Result Value Ref Range Status   SARS Coronavirus 2 NEGATIVE NEGATIVE Final    Comment: (NOTE) SARS-CoV-2 target nucleic acids are NOT DETECTED. The SARS-CoV-2 RNA is generally detectable in upper and lower respiratory specimens during the acute phase of infection. Negative results do not preclude SARS-CoV-2 infection, do not rule out co-infections with other pathogens, and should not be used as the sole basis for treatment or other patient management  decisions. Negative results must be combined with clinical observations, patient history, and epidemiological information. The expected result is Negative. Fact Sheet for Patients: SugarRoll.be Fact Sheet for Healthcare Providers: https://www.woods-mathews.com/ This test is not yet approved or cleared by the [place] FDA and  has been authorized for detection and/or diagnosis of SARS-CoV-2 by FDA under an Emergency Use Authorization (EUA). This EUA will remain  in effect (meaning this test can be used) for the duration of the COVID-19 declaration under Section 56 4(b)(1) of the Act, 21 U.S.C. section 360bbb-3(b)(1), unless the authorization is terminated or revoked sooner. Performed at [facility], [place] [address], [place], [place] [address]   Urine culture     Status: Abnormal   Collection Time: 08/01/19  6:07 PM   Specimen: Urine, Clean Catch  Result Value Ref Range Status   Specimen Description   Final    URINE, CLEAN CATCH Performed at [facility], [place] [address], [place], [place] [address]    Special Requests   Final    NONE Performed at [facility], [place] [address] [place], [place] [address]    Culture >=100,000 COLONIES/mL KLEBSIELLA PNEUMONIAE (A)  Final   Report Status 08/04/2019 FINAL  Final   Organism ID, Bacteria KLEBSIELLA PNEUMONIAE (A)  Final      Susceptibility   Klebsiella pneumoniae - MIC*    AMPICILLIN >=32 RESISTANT Resistant     CEFAZOLIN <=4 SENSITIVE Sensitive     CEFTRIAXONE <=1 SENSITIVE Sensitive     CIPROFLOXACIN <=0.25 SENSITIVE Sensitive     GENTAMICIN <=1 SENSITIVE Sensitive     IMIPENEM <=0.25 SENSITIVE Sensitive     NITROFURANTOIN 64 INTERMEDIATE Intermediate     TRIMETH/SULFA <=20 SENSITIVE Sensitive     AMPICILLIN/SULBACTAM >=32 RESISTANT Resistant     PIP/TAZO 32 INTERMEDIATE Intermediate     * >=100,000 COLONIES/mL KLEBSIELLA PNEUMONIAE  Blood  culture (routine x 2)     Status: Abnormal  Collection Time: 08/01/19  8:12 PM   Specimen: BLOOD  Result Value Ref Range Status   Specimen Description   Final    BLOOD RIGHT ANTECUBITAL Performed at [facility] [address], [place], [place] [address]    Special Requests   Final    BOTTLES DRAWN AEROBIC AND ANAEROBIC Blood Culture adequate volume Performed at [facility] [address] [place], [place] [address]    Culture  Setup Time   Final    GRAM POSITIVE COCCI IN CLUSTERS AEROBIC BOTTLE ONLY CRITICAL RESULT CALLED TO, READ BACK BY AND VERIFIED WITH: PHARMD DREW WOFFORD [phone] [id number] FCP    Culture (A)  Final    STAPHYLOCOCCUS SPECIES (COAGULASE NEGATIVE) THE SIGNIFICANCE OF ISOLATING THIS ORGANISM FROM A SINGLE SET OF BLOOD CULTURES WHEN MULTIPLE SETS ARE DRAWN IS UNCERTAIN. PLEASE NOTIFY THE MICROBIOLOGY DEPARTMENT WITHIN ONE WEEK IF SPECIATION AND SENSITIVITIES ARE REQUIRED. Performed at [facility], [place] [address] [place], [name] [address]    Report Status 08/03/2019 FINAL  Final  Blood culture (routine x 2)     Status: None (Preliminary result)   Collection Time: 08/01/19  8:12 PM   Specimen: BLOOD  Result Value Ref Range Status   Specimen Description   Final    BLOOD LEFT ANTECUBITAL Performed at [facility] [address], [place], [place] [address]    Special Requests   Final    BOTTLES DRAWN AEROBIC AND ANAEROBIC Blood Culture results may not be optimal due to an excessive volume of blood received in culture bottles Performed at [facility] [address] [place], [place] [address]    Culture  Setup Time PENDING  Incomplete   Culture   Final    NO GROWTH 2 DAYS Performed at [facility], [place] [address], [place], [place] [address]    Report Status PENDING  Incomplete  SARS CORONAVIRUS 2 (TAT 6-24 HRS) Nasopharyngeal     Status: None   Collection Time: 08/01/19   8:12 PM   Specimen: Nasopharyngeal  Result Value Ref Range Status   SARS Coronavirus 2 NEGATIVE NEGATIVE Final    Comment: (NOTE) SARS-CoV-2 target nucleic acids are NOT DETECTED. The SARS-CoV-2 RNA is generally detectable in upper and lower respiratory specimens during the acute phase of infection. Negative results do not preclude SARS-CoV-2 infection, do not rule out co-infections with other pathogens, and should not be used as the sole basis for treatment or other patient management decisions. Negative results must be combined with clinical observations, patient history, and epidemiological information. The expected result is Negative. Fact Sheet for Patients: SugarRoll.be Fact Sheet for Healthcare Providers: https://www.woods-mathews.com/ This test is not yet approved or cleared by the [place] FDA and  has been authorized for detection and/or diagnosis of SARS-CoV-2 by FDA under an Emergency Use Authorization (EUA). This EUA will remain  in effect (meaning this test can be used) for the duration of the COVID-19 declaration under Section 56 4(b)(1) of the Act, 21 U.S.C. section 360bbb-3(b)(1), unless the authorization is terminated or revoked sooner. Performed at [facility], [place] [address], [place], [place] [address]      Time coordinating discharge: 35 minutes The  controlled substances registry was reviewed for this patient    SIGNED:   Edwin Dada, MD  [facility] 08/04/2019, 9:51 AM

## 2019-08-04 NOTE — NC FL2 (Signed)
[place] LEVEL OF CARE SCREENING TOOL     IDENTIFICATION  Patient Name: Erika [name] Birthdate: [birth year] Sex: female Admission Date (Current Location): 08/01/2019  [facility] and [place] Number:  Herbalist and Address:  [facility],  [place], [place]      Provider Number: [id number]  Attending Physician Name and Address:  Edwin Dada, *  Relative Name and Phone Number:  Gerald Stabs [phone]    Current Level of Care: Hospital Recommended Level of Care: [place] Prior Approval Number:    Date Approved/Denied:   PASRR Number: [id number]  Discharge Plan: SNF    Current Diagnoses: Patient Active Problem List   Diagnosis Date Noted  . AMS (altered mental status) 08/01/2019  . Acute lower UTI 08/01/2019  . Agitation 07/24/2019  . Tumor lysis syndrome   . Hyperuricemia   . Thrombocytopenia ([place])   . Myeloproliferative disorder ([place]) 07/10/2019  . AKI (acute kidney injury) ([place]) 07/10/2019  . AF (paroxysmal atrial fibrillation) ([place]) 06/02/2017  . Leukocytosis 05/29/2017  . Anemia 05/29/2017  . Leg swelling 02/23/2016  . Hand cramps 11/22/2015  . GERD (gastroesophageal reflux disease) 02/09/2013  . OA (osteoarthritis) of knee 01/29/2012  . OA (osteoarthritis of spine) 10/18/2011  . Healthcare maintenance 04/25/2011  . Hypokalemia 02/26/2008  . Moderate persistent asthma 02/10/2008  . Hyperlipidemia 08/04/2007  . H/O Herpes zoster 08/29/2006  . Obesity, Class III, BMI 40-49.9 (morbid obesity) ([place]) 08/29/2006  . History of breast cyst 08/29/2006  . Essential hypertension 08/27/2006  . 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    Orientation RESPIRATION BLADDER Height & Weight     Self, Time, Situation, Place  Normal Incontinent Weight: 122.2 kg Height:  5\' 6"  (167.6 cm)  BEHAVIORAL  SYMPTOMS/MOOD NEUROLOGICAL BOWEL NUTRITION STATUS      Continent Diet  AMBULATORY STATUS COMMUNICATION OF NEEDS Skin   Limited Assist Verbally Skin abrasions                       Personal Care Assistance Level of Assistance  Bathing, Dressing, Feeding Bathing Assistance: Maximum assistance Feeding assistance: Independent Dressing Assistance: Maximum assistance     Functional Limitations Info  Sight, Hearing, Speech Sight Info: Adequate Hearing Info: Adequate Speech Info: Adequate    SPECIAL CARE FACTORS FREQUENCY  PT (By licensed PT), OT (By licensed OT)     PT Frequency: 5x weekly OT Frequency: 5x weekly            Contractures Contractures Info: Not present    Additional Factors Info  Code Status, Allergies Code Status Info: Full code Allergies Info: Prednisone           Current Medications (08/04/2019):  This is the current hospital active medication list Current Facility-Administered Medications  Medication Dose Route Frequency Provider Last Rate Last Admin  . acetaminophen (TYLENOL) tablet 650 mg  650 mg Oral Q6H PRN Elwyn Reach, MD       Or  . acetaminophen (TYLENOL) suppository 650 mg  650 mg Rectal Q6H PRN Garba, Mohammad L, MD      . albuterol (VENTOLIN HFA) 108 (90 Base) MCG/ACT inhaler 1-2 puff  1-2 puff Inhalation Q6H PRN Gala Romney L, MD      . allopurinol (ZYLOPRIM) tablet 150 mg  150 mg Oral BID Elwyn Reach, MD   150 mg at 08/03/19 2109  .  amLODipine (NORVASC) tablet 10 mg  10 mg Oral Daily Elwyn Reach, MD   10 mg at 08/03/19 0946  . apixaban (ELIQUIS) tablet 5 mg  5 mg Oral BID Gala Romney L, MD   5 mg at 08/03/19 2110  . aspirin EC tablet 81 mg  81 mg Oral Daily Elwyn Reach, MD   81 mg at 08/03/19 0946  . cephALEXin (KEFLEX) capsule 500 mg  500 mg Oral Q8H Danford, Suann Larry, MD      . diphenhydrAMINE (BENADRYL) capsule 25 mg  25 mg Oral QHS PRN Gala Romney L, MD      . feeding supplement (PRO-STAT  SUGAR FREE 64) liquid 30 mL  30 mL Oral Daily Amin, Ankit Chirag, MD   30 mL at 08/03/19 1741  . fluticasone (FLONASE) 50 MCG/ACT nasal spray 1 spray  1 spray Each Nare Daily Elwyn Reach, MD   1 spray at 08/03/19 0948  . metoprolol tartrate (LOPRESSOR) tablet 50 mg  50 mg Oral BID Elwyn Reach, MD   50 mg at 08/03/19 2109  . multivitamin with minerals tablet 1 tablet  1 tablet Oral Daily Damita Lack, MD   1 tablet at 08/03/19 1741  . nystatin (MYCOSTATIN/NYSTOP) topical powder   Topical BID Elwyn Reach, MD   Given at 08/03/19 2119  . ondansetron (ZOFRAN) tablet 4 mg  4 mg Oral Q6H PRN Elwyn Reach, MD       Or  . ondansetron (ZOFRAN) injection 4 mg  4 mg Intravenous Q6H PRN Gala Romney L, MD      . pantoprazole (PROTONIX) EC tablet 40 mg  40 mg Oral Daily Elwyn Reach, MD   40 mg at 08/03/19 0946  . pravastatin (PRAVACHOL) tablet 20 mg  20 mg Oral q1800 Elwyn Reach, MD   20 mg at 08/03/19 1741  . protein supplement (ENSURE MAX) liquid  11 oz Oral BID Damita Lack, MD   11 oz at 08/03/19 2100  . QUEtiapine (SEROQUEL) tablet 12.5 mg  12.5 mg Oral BID PRN Gala Romney L, MD      . QUEtiapine (SEROQUEL) tablet 12.5 mg  12.5 mg Oral QHS Gala Romney L, MD   12.5 mg at 08/03/19 2108  . tiZANidine (ZANAFLEX) tablet 4 mg  4 mg Oral Q8H PRN Elwyn Reach, MD   4 mg at 08/02/19 1727     Discharge Medications: Please see discharge summary for a list of discharge medications.  Relevant Imaging Results:  Relevant Lab Results:   Additional Information SSN  [id number]  Kavir Savoca, Marjie Skiff, RN

## 2019-08-05 DIAGNOSIS — J45998 Other asthma: Secondary | ICD-10-CM | POA: Diagnosis not present

## 2019-08-05 DIAGNOSIS — M2550 Pain in unspecified joint: Secondary | ICD-10-CM | POA: Diagnosis not present

## 2019-08-05 DIAGNOSIS — M6281 Muscle weakness (generalized): Secondary | ICD-10-CM | POA: Diagnosis not present

## 2019-08-05 DIAGNOSIS — I480 Paroxysmal atrial fibrillation: Secondary | ICD-10-CM

## 2019-08-05 DIAGNOSIS — D471 Chronic myeloproliferative disease: Secondary | ICD-10-CM | POA: Diagnosis not present

## 2019-08-05 DIAGNOSIS — N179 Acute kidney failure, unspecified: Secondary | ICD-10-CM

## 2019-08-05 DIAGNOSIS — N178 Other acute kidney failure: Secondary | ICD-10-CM | POA: Diagnosis not present

## 2019-08-05 DIAGNOSIS — R2681 Unsteadiness on feet: Secondary | ICD-10-CM | POA: Diagnosis not present

## 2019-08-05 DIAGNOSIS — G9341 Metabolic encephalopathy: Secondary | ICD-10-CM | POA: Diagnosis not present

## 2019-08-05 DIAGNOSIS — R062 Wheezing: Secondary | ICD-10-CM | POA: Diagnosis not present

## 2019-08-05 DIAGNOSIS — N390 Urinary tract infection, site not specified: Secondary | ICD-10-CM | POA: Diagnosis not present

## 2019-08-05 DIAGNOSIS — N3000 Acute cystitis without hematuria: Secondary | ICD-10-CM | POA: Diagnosis not present

## 2019-08-05 DIAGNOSIS — R41841 Cognitive communication deficit: Secondary | ICD-10-CM | POA: Diagnosis not present

## 2019-08-05 DIAGNOSIS — E883 Tumor lysis syndrome: Secondary | ICD-10-CM | POA: Diagnosis not present

## 2019-08-05 DIAGNOSIS — Z029 Encounter for administrative examinations, unspecified: Secondary | ICD-10-CM | POA: Diagnosis not present

## 2019-08-05 DIAGNOSIS — I10 Essential (primary) hypertension: Secondary | ICD-10-CM | POA: Diagnosis not present

## 2019-08-05 DIAGNOSIS — M5416 Radiculopathy, lumbar region: Secondary | ICD-10-CM | POA: Diagnosis not present

## 2019-08-05 DIAGNOSIS — R262 Difficulty in walking, not elsewhere classified: Secondary | ICD-10-CM | POA: Diagnosis not present

## 2019-08-05 DIAGNOSIS — Z7401 Bed confinement status: Secondary | ICD-10-CM | POA: Diagnosis not present

## 2019-08-05 DIAGNOSIS — F22 Delusional disorders: Secondary | ICD-10-CM | POA: Diagnosis not present

## 2019-08-05 DIAGNOSIS — R2689 Other abnormalities of gait and mobility: Secondary | ICD-10-CM | POA: Diagnosis not present

## 2019-08-05 DIAGNOSIS — U071 COVID-19: Secondary | ICD-10-CM | POA: Diagnosis not present

## 2019-08-05 NOTE — Progress Notes (Signed)
PROGRESS NOTE    Erika [name]  [id number] DOB: [birth year] DOA: 08/01/2019 PCP: [name], MD     Assessment & Plan:   Principal Problem:   AMS (altered mental status) Active Problems:   Obesity, Class III, BMI 40-49.9 (morbid obesity) ([place])   Essential hypertension   GERD (gastroesophageal reflux disease)   Leukocytosis   AF (paroxysmal atrial fibrillation) (HCC)   Myeloproliferative disorder (HCC)   AKI (acute kidney injury) ([place])   Acute lower UTI  Acute metabolic encephalopathy Secondary to dehydration and UTI.  Started on ceftriaxone and IV fluids, mentation resolved to normal.  Urine culture growing Klebsiella, cefazolin susceptible.  Transitioned to Keflex to complete 5 days.  Encouraged to drink 48-64 ounces water per day.  AKI Patient baseline creatinine is 0.9, no chronic kidney disease.  At admission she had creatinine 2.02, which improved over 72 hours with IV fluids.  Hypertension  Myeloproliferative neoplasm NOS I discussed with hematology on-call, who reviewed her laboratory studies, and recommended no further work-up.  Given her neutrophils, sludging and vascular occlusion is unlikely.  Morbid obesity, BMI 43  Paroxysmal atrial fibrillation  Debility Prior to admit, was ambulating, now having difficulty with ambulation but is able to pull self up and sit at side of bed without difficulty Patient is eager to participate with SNF with goal of improving strength and mobility Pt is aware that the chance of significant recovery would be difficult given prolonged length of debility  Disposition Plan: SNF   Antimicrobials: Anti-infectives (From admission, onward)   Start     Dose/Rate Route Frequency Ordered Stop   08/04/19 1200  cephALEXin (KEFLEX) capsule 500 mg     500 mg Oral Every 8 hours 08/04/19 0948     08/04/19 0000  cephALEXin (KEFLEX) 500 MG capsule     500 mg Oral Every 8 hours 08/04/19 0951 08/06/19 2359   08/02/19 2000   cefTRIAXone (ROCEPHIN) 1 g in sodium chloride 0.9 % 100 mL IVPB  Status:  Discontinued     1 g 200 mL/hr over 30 Minutes Intravenous Every 24 hours 08/01/19 2223 08/04/19 0948   08/01/19 2015  cefTRIAXone (ROCEPHIN) 1 g in sodium chloride 0.9 % 100 mL IVPB     1 g 200 mL/hr over 30 Minutes Intravenous  Once 08/01/19 1929 08/01/19 2152       Subjective: Eager to go to rehab  Objective: Vitals:   08/04/19 0636 08/04/19 1450 08/04/19 2103 08/05/19 0631  BP: (!) 105/57 (!) 132/59 126/63 133/62  Pulse: 87 78 85 83  Resp: 20 18 17 16   Temp: 98.1 F (36.7 C) 98.5 F (36.9 C) 98.2 F (36.8 C) 97.8 F (36.6 C)  TempSrc: Oral Oral Oral Oral  SpO2: 95% 97% 96% 94%  Weight:      Height:        Intake/Output Summary (Last 24 hours) at 08/05/2019 1139 Last data filed at 08/05/2019 1100 Gross per 24 hour  Intake 720 ml  Output 900 ml  Net -180 ml   Filed Weights   08/01/19 2227  Weight: 122.2 kg    Examination: General exam: Awake, laying in bed, in nad Respiratory system: Normal respiratory effort, no wheezing Cardiovascular system: regular rate, s1, s2 Gastrointestinal system: Soft, nondistended, positive BS Central nervous system: CN2-12 grossly intact, UE strength intact, LE weakness B Extremities: Perfused, no clubbing Skin: Normal skin turgor, no notable skin lesions seen Psychiatry: Mood normal // no visual hallucinations   Data Reviewed: I  have personally reviewed following labs and imaging studies  CBC: Recent Labs  Lab 08/01/19 1807 08/02/19 0537 08/04/19 0501  WBC 98.8* 76.7* 103.1*  NEUTROABS 94.8*  --   --   HGB 11.0* 9.0* 9.0*  HCT 34.4* 28.9* 29.0*  MCV 97.5 98.6 99.7  PLT 215 194 [id number]   Basic Metabolic Panel: Recent Labs  Lab 08/01/19 1807 08/02/19 0537 08/04/19 0501  NA 129* 136 133*  K 4.7 4.1 4.2  CL 95* 97* 101  CO2 23 24 21*  GLUCOSE 89 51* 67*  BUN 28* 29* 23  CREATININE 2.02* 1.50* 0.91  CALCIUM 8.9 8.4* 8.9  MG  --   --  1.8    GFR: Estimated Creatinine Clearance: 70.2 mL/min (by C-G formula based on SCr of 0.91 mg/dL). Liver Function Tests: Recent Labs  Lab 08/01/19 1807 08/02/19 0537  AST 21 19  ALT 14 13  ALKPHOS 268* 231*  BILITOT 0.4 0.5  PROT 8.5* 7.6  ALBUMIN 3.7 3.3*   No results for input(s): LIPASE, AMYLASE in the last 168 hours. No results for input(s): AMMONIA in the last 168 hours. Coagulation Profile: No results for input(s): INR, PROTIME in the last 168 hours. Cardiac Enzymes: No results for input(s): CKTOTAL, CKMB, CKMBINDEX, TROPONINI in the last 168 hours. BNP (last 3 results) No results for input(s): PROBNP in the last 8760 hours. HbA1C: No results for input(s): HGBA1C in the last 72 hours. CBG: Recent Labs  Lab 08/01/19 1805 08/03/19 1125  GLUCAP 77 87   Lipid Profile: No results for input(s): CHOL, HDL, LDLCALC, TRIG, CHOLHDL, LDLDIRECT in the last 72 hours. Thyroid Function Tests: No results for input(s): TSH, T4TOTAL, FREET4, T3FREE, THYROIDAB in the last 72 hours. Anemia Panel: No results for input(s): VITAMINB12, FOLATE, FERRITIN, TIBC, IRON, RETICCTPCT in the last 72 hours. Sepsis Labs: No results for input(s): PROCALCITON, LATICACIDVEN in the last 168 hours.  Recent Results (from the past 240 hour(s))  SARS CORONAVIRUS 2 (TAT 6-24 HRS) Nasopharyngeal Nasopharyngeal Swab     Status: None   Collection Time: 07/27/19  1:34 PM   Specimen: Nasopharyngeal Swab  Result Value Ref Range Status   SARS Coronavirus 2 NEGATIVE NEGATIVE Final    Comment: (NOTE) SARS-CoV-2 target nucleic acids are NOT DETECTED. The SARS-CoV-2 RNA is generally detectable in upper and lower respiratory specimens during the acute phase of infection. Negative results do not preclude SARS-CoV-2 infection, do not rule out co-infections with other pathogens, and should not be used as the sole basis for treatment or other patient management decisions. Negative results must be combined with clinical  observations, patient history, and epidemiological information. The expected result is Negative. Fact Sheet for Patients: SugarRoll.be Fact Sheet for Healthcare Providers: https://www.woods-mathews.com/ This test is not yet approved or cleared by the [place] FDA and  has been authorized for detection and/or diagnosis of SARS-CoV-2 by FDA under an Emergency Use Authorization (EUA). This EUA will remain  in effect (meaning this test can be used) for the duration of the COVID-19 declaration under Section 56 4(b)(1) of the Act, 21 U.S.C. section 360bbb-3(b)(1), unless the authorization is terminated or revoked sooner. Performed at [facility], [place] [address], [place], [place] [address]   Urine culture     Status: Abnormal   Collection Time: 08/01/19  6:07 PM   Specimen: Urine, Clean Catch  Result Value Ref Range Status   Specimen Description   Final    URINE, CLEAN CATCH Performed at [facility], [place] [address], [place], [place] [address]    Special Requests   Final    NONE Performed at [facility], [place] [address], [place], [place] [address]    Culture >=100,000 COLONIES/mL KLEBSIELLA PNEUMONIAE (A)  Final   Report Status 08/04/2019 FINAL  Final   Organism ID, Bacteria KLEBSIELLA PNEUMONIAE (A)  Final      Susceptibility   Klebsiella pneumoniae - MIC*    AMPICILLIN >=32 RESISTANT Resistant     CEFAZOLIN <=4 SENSITIVE Sensitive     CEFTRIAXONE <=1 SENSITIVE Sensitive     CIPROFLOXACIN <=0.25 SENSITIVE Sensitive     GENTAMICIN <=1 SENSITIVE Sensitive     IMIPENEM <=0.25 SENSITIVE Sensitive     NITROFURANTOIN 64 INTERMEDIATE Intermediate     TRIMETH/SULFA <=20 SENSITIVE Sensitive     AMPICILLIN/SULBACTAM >=32 RESISTANT Resistant     PIP/TAZO 32 INTERMEDIATE Intermediate     * >=100,000 COLONIES/mL KLEBSIELLA PNEUMONIAE  Blood culture (routine x 2)     Status: Abnormal   Collection Time:  08/01/19  8:12 PM   Specimen: BLOOD  Result Value Ref Range Status   Specimen Description   Final    BLOOD RIGHT ANTECUBITAL Performed at [place] [address], [place], [place] [address]    Special Requests   Final    BOTTLES DRAWN AEROBIC AND ANAEROBIC Blood Culture adequate volume Performed at [place] [address] [place], [place] [address]    Culture  Setup Time   Final    GRAM POSITIVE COCCI IN CLUSTERS AEROBIC BOTTLE ONLY CRITICAL RESULT CALLED TO, READ BACK BY AND VERIFIED WITH: PHARMD DREW WOFFORD [id number] FCP    Culture (A)  Final    STAPHYLOCOCCUS SPECIES (COAGULASE NEGATIVE) THE SIGNIFICANCE OF ISOLATING THIS ORGANISM FROM A SINGLE SET OF BLOOD CULTURES WHEN MULTIPLE SETS ARE DRAWN IS UNCERTAIN. PLEASE NOTIFY THE MICROBIOLOGY DEPARTMENT WITHIN ONE WEEK IF SPECIATION AND SENSITIVITIES ARE REQUIRED. Performed at [facility], [place] [address], [place], [place] [address]    Report Status 08/03/2019 FINAL  Final  Blood culture (routine x 2)     Status: None (Preliminary result)   Collection Time: 08/01/19  8:12 PM   Specimen: BLOOD  Result Value Ref Range Status   Specimen Description   Final    BLOOD LEFT ANTECUBITAL Performed at [place] [address] [place], [place] [address]    Special Requests   Final    BOTTLES DRAWN AEROBIC AND ANAEROBIC Blood Culture results may not be optimal due to an excessive volume of blood received in culture bottles Performed at [place] [address], [place], [place] [address]    Culture  Setup Time PENDING  Incomplete   Culture   Final    NO GROWTH 4 DAYS Performed at [facility], [place] [address], [place], [place] [address]    Report Status PENDING  Incomplete  SARS CORONAVIRUS 2 (TAT 6-24 HRS) Nasopharyngeal     Status: None   Collection Time: 08/01/19  8:12 PM   Specimen: Nasopharyngeal  Result Value Ref Range  Status   SARS Coronavirus 2 NEGATIVE NEGATIVE Final    Comment: (NOTE) SARS-CoV-2 target nucleic acids are NOT DETECTED. The SARS-CoV-2 RNA is generally detectable in upper and lower respiratory specimens during the acute phase of infection. Negative results do not preclude SARS-CoV-2 infection, do not rule out co-infections with other pathogens, and should not be used as  the sole basis for treatment or other patient management decisions. Negative results must be combined with clinical observations, patient history, and epidemiological information. The expected result is Negative. Fact Sheet for Patients: SugarRoll.be Fact Sheet for Healthcare Providers: https://www.woods-mathews.com/ This test is not yet approved or cleared by the [place] FDA and  has been authorized for detection and/or diagnosis of SARS-CoV-2 by FDA under an Emergency Use Authorization (EUA). This EUA will remain  in effect (meaning this test can be used) for the duration of the COVID-19 declaration under Section 56 4(b)(1) of the Act, 21 U.S.C. section 360bbb-3(b)(1), unless the authorization is terminated or revoked sooner. Performed at [facility], [place] [address], [place], [place] [address]      Radiology Studies: No results found.  Scheduled Meds: . allopurinol  150 mg Oral BID  . amLODipine  10 mg Oral Daily  . apixaban  5 mg Oral BID  . aspirin EC  81 mg Oral Daily  . cephALEXin  500 mg Oral Q8H  . feeding supplement (PRO-STAT SUGAR FREE 64)  30 mL Oral Daily  . fluticasone  1 spray Each Nare Daily  . metoprolol tartrate  50 mg Oral BID  . multivitamin with minerals  1 tablet Oral Daily  . nystatin   Topical BID  . pantoprazole  40 mg Oral Daily  . pravastatin  20 mg Oral q1800  . Ensure Max Protein  11 oz Oral BID  . QUEtiapine  12.5 mg Oral QHS   Continuous Infusions:   LOS: 4 days   Marylu Lund, MD [facility] Pager On Amion   If 7PM-7AM, please contact night-coverage 08/05/2019, 11:39 AM

## 2019-08-05 NOTE — TOC Transition Note (Signed)
Transition of Care [facility]) - CM/SW Discharge Note   Patient Details  Name: Erika [name] MRN: [id number] Date of Birth: 1941-06-05  [facility] CM/SW Contact:  Lynnell Catalan, RN Phone Number: 08/05/2019, 12:28 PM   Clinical Narrative:    This CM received auth from [facility] for SNF [phone]). Auth for [facility] [id number]). [place] Liaison alerted of [place]. DC summary sent via hub and [facility] set up for transport.         Readmission Risk Interventions Readmission Risk Prevention Plan 08/04/2019  Transportation Screening Complete  PCP or Specialist Appt within 3-5 Days Complete  HRI or [place] Complete  Social Work Consult for [place] Planning/Counseling Complete  Palliative Care Screening Not Applicable  Medication Review [employer]) Complete  Some recent data might be hidden

## 2019-08-05 NOTE — Progress Notes (Signed)
Attempted to call report to [facility] place with no answer. The patient has been picked up by [facility].

## 2019-08-05 NOTE — Progress Notes (Signed)
Physical Therapy Treatment Patient Details Name: Erika [name] MRN: [id number] DOB: 12/21/1940 Today's Date: 08/05/2019    History of Present Illness 78 yo female admitted from SNF with AMS, weakness. Hx of morbid obesity, anemia, asthma, DDD, chronic back pain, myeloproliferative disease, SVT, A fib, psychosis, shingles    PT Comments    Patient made good progress with use of sara stedy for transfer training. Pt required decreased assist for bed mobility and was able to perform lateral and posterior/anterior scooting with min guard to reposition at EOB. Pt required Max assist +2 for initial stand from EOB and min-mod assist +2 from sara stedy paddles. Repeated sit<>stands performed for exercises and pt educated on ankle pumps for LE edema. Acute PT will continue to progress as able.   Follow Up Recommendations  SNF     Equipment Recommendations  None recommended by PT    Recommendations for Other Services OT consult     Precautions / Restrictions Precautions Precautions: Fall Restrictions Weight Bearing Restrictions: No    Mobility  Bed Mobility Overal bed mobility: Needs Assistance Bed Mobility: Supine to Sit     Supine to sit: HOB elevated;Min guard Sit to supine: Min assist;HOB elevated   General bed mobility comments: increased time, use of bed rails for supine to sit tranfer. light assist for LE's with sit to supine.  Transfers Overall transfer level: Needs assistance   Transfers: Sit to/from Stand;Lateral/Scoot Transfers Sit to Stand: Max assist;+2 physical assistance;+2 safety/equipment;From elevated surface;Mod assist        Lateral/Scoot Transfers: Min guard General transfer comment: pt required max assist +2 for sit to stand from EOB to initiate power up with sara stedy. pt able to shift hips forward and paddles brought down. min-mod assist +2 for sit to stand tranfers from paddles. 3x 5 reps performed for Sit<>Stand for LE strengthening with several  mintues rest between. verbal/tactile cues required for anterior weight shift at hips to improve upright posture. pt able to complete lateral scoots at EOB to reposition for Stedy setup and performed posterior scoot to move back to center of bed.  Ambulation/Gait            Stairs             Wheelchair Mobility    Modified Rankin (Stroke Patients Only)       Balance Overall balance assessment: Needs assistance Sitting-balance support: Feet supported Sitting balance-Leahy Scale: Good     Standing balance support: Bilateral upper extremity supported Standing balance-Leahy Scale: Poor Standing balance comment: pt reliant on external support             Cognition Arousal/Alertness: Awake/alert Behavior During Therapy: WFL for tasks assessed/performed Overall Cognitive Status: Within Functional Limits for tasks assessed               Exercises General Exercises - Lower Extremity Ankle Circles/Pumps: AROM;10 reps;Both;Supine Other Exercises Other Exercises: Pt perfromed 3x 1 minute of lateral weight shift while standing in Stedy to shift form Rt to Lt LE.    General Comments        Pertinent Vitals/Pain Pain Assessment: No/denies pain           PT Goals (current goals can now be found in the care plan section) Acute Rehab PT Goals Patient Stated Goal: to be able to get OOB and walk PT Goal Formulation: With patient Time For Goal Achievement: 08/17/19 Potential to Achieve Goals: Fair Progress towards PT goals: Progressing toward goals  Frequency    Min 3X/week      PT Plan Current plan remains appropriate       AM-PAC PT "6 Clicks" Mobility   Outcome Measure  Help needed turning from your back to your side while in a flat bed without using bedrails?: A Little Help needed moving from lying on your back to sitting on the side of a flat bed without using bedrails?: A Little Help needed moving to and from a bed to a chair (including a  wheelchair)?: A Lot Help needed standing up from a chair using your arms (e.g., wheelchair or bedside chair)?: Total Help needed to walk in hospital room?: Total Help needed climbing 3-5 steps with a railing? : Total 6 Click Score: 11    End of Session Equipment Utilized During Treatment: Gait belt Activity Tolerance: Patient tolerated treatment well Patient left: in chair;with call bell/phone within reach;with chair alarm set Nurse Communication: Mobility status PT Visit Diagnosis: Muscle weakness (generalized) (M62.81);Difficulty in walking, not elsewhere classified (R26.2)     Time: [id number] PT Time Calculation (min) (ACUTE ONLY): 23 min  Charges:  $Therapeutic Exercise: 8-22 mins $Therapeutic Activity: 8-22 mins                     Gwynneth Albright PT, DPT Physical Therapist with [facility]  08/05/2019 3:53 PM

## 2019-08-10 LAB — CULTURE, BLOOD (ROUTINE X 2): Culture: NO GROWTH

## 2019-08-13 ENCOUNTER — Telehealth: Payer: Self-pay | Admitting: Hematology

## 2019-08-13 ENCOUNTER — Encounter: Payer: Self-pay | Admitting: Hematology

## 2019-08-13 ENCOUNTER — Other Ambulatory Visit: Payer: Self-pay | Admitting: Hematology

## 2019-08-13 DIAGNOSIS — D471 Chronic myeloproliferative disease: Secondary | ICD-10-CM

## 2019-08-13 NOTE — Progress Notes (Unsigned)
Scheduling message sent for return to clinic with Dr. Irene Limbo in 3 to 4 weeks with labs for posthospitalization follow-up

## 2019-08-13 NOTE — Telephone Encounter (Signed)
Scheduled per 1/7 sch msg. Called and spoke with pt, confirmed 2/2 appt

## 2019-08-14 DIAGNOSIS — F22 Delusional disorders: Secondary | ICD-10-CM | POA: Diagnosis not present

## 2019-08-14 DIAGNOSIS — E883 Tumor lysis syndrome: Secondary | ICD-10-CM | POA: Diagnosis not present

## 2019-08-14 DIAGNOSIS — I10 Essential (primary) hypertension: Secondary | ICD-10-CM | POA: Diagnosis not present

## 2019-08-14 DIAGNOSIS — N390 Urinary tract infection, site not specified: Secondary | ICD-10-CM | POA: Diagnosis not present

## 2019-08-14 DIAGNOSIS — N178 Other acute kidney failure: Secondary | ICD-10-CM | POA: Diagnosis not present

## 2019-08-14 DIAGNOSIS — J45998 Other asthma: Secondary | ICD-10-CM | POA: Diagnosis not present

## 2019-08-14 DIAGNOSIS — I480 Paroxysmal atrial fibrillation: Secondary | ICD-10-CM | POA: Diagnosis not present

## 2019-08-18 DIAGNOSIS — N390 Urinary tract infection, site not specified: Secondary | ICD-10-CM | POA: Diagnosis not present

## 2019-08-18 DIAGNOSIS — N178 Other acute kidney failure: Secondary | ICD-10-CM | POA: Diagnosis not present

## 2019-08-18 DIAGNOSIS — I10 Essential (primary) hypertension: Secondary | ICD-10-CM | POA: Diagnosis not present

## 2019-08-18 DIAGNOSIS — I480 Paroxysmal atrial fibrillation: Secondary | ICD-10-CM | POA: Diagnosis not present

## 2019-08-18 DIAGNOSIS — R262 Difficulty in walking, not elsewhere classified: Secondary | ICD-10-CM | POA: Diagnosis not present

## 2019-08-18 DIAGNOSIS — D471 Chronic myeloproliferative disease: Secondary | ICD-10-CM | POA: Diagnosis not present

## 2019-08-18 DIAGNOSIS — J45998 Other asthma: Secondary | ICD-10-CM | POA: Diagnosis not present

## 2019-08-18 DIAGNOSIS — Z029 Encounter for administrative examinations, unspecified: Secondary | ICD-10-CM | POA: Diagnosis not present

## 2019-08-18 DIAGNOSIS — G9341 Metabolic encephalopathy: Secondary | ICD-10-CM | POA: Diagnosis not present

## 2019-08-18 DIAGNOSIS — M5416 Radiculopathy, lumbar region: Secondary | ICD-10-CM | POA: Diagnosis not present

## 2019-08-21 ENCOUNTER — Emergency Department (HOSPITAL_COMMUNITY): Payer: PPO

## 2019-08-21 ENCOUNTER — Encounter (HOSPITAL_COMMUNITY): Payer: Self-pay | Admitting: *Deleted

## 2019-08-21 ENCOUNTER — Other Ambulatory Visit: Payer: Self-pay

## 2019-08-21 ENCOUNTER — Inpatient Hospital Stay (HOSPITAL_COMMUNITY)
Admission: EM | Admit: 2019-08-21 | Discharge: 2019-08-27 | DRG: 281 | Disposition: A | Discharge status: Home Health | Payer: PPO | Attending: Student in an Organized Health Care Education/Training Program | Admitting: Student in an Organized Health Care Education/Training Program

## 2019-08-21 DIAGNOSIS — Z7901 Long term (current) use of anticoagulants: Secondary | ICD-10-CM

## 2019-08-21 DIAGNOSIS — J4540 Moderate persistent asthma, uncomplicated: Secondary | ICD-10-CM | POA: Diagnosis present

## 2019-08-21 DIAGNOSIS — I10 Essential (primary) hypertension: Secondary | ICD-10-CM | POA: Diagnosis present

## 2019-08-21 DIAGNOSIS — R0600 Dyspnea, unspecified: Secondary | ICD-10-CM

## 2019-08-21 DIAGNOSIS — I361 Nonrheumatic tricuspid (valve) insufficiency: Secondary | ICD-10-CM

## 2019-08-21 DIAGNOSIS — C946 Myelodysplastic disease, not classified: Secondary | ICD-10-CM | POA: Diagnosis not present

## 2019-08-21 DIAGNOSIS — M479 Spondylosis, unspecified: Secondary | ICD-10-CM | POA: Diagnosis present

## 2019-08-21 DIAGNOSIS — E785 Hyperlipidemia, unspecified: Secondary | ICD-10-CM | POA: Diagnosis not present

## 2019-08-21 DIAGNOSIS — Z79899 Other long term (current) drug therapy: Secondary | ICD-10-CM

## 2019-08-21 DIAGNOSIS — I110 Hypertensive heart disease with heart failure: Secondary | ICD-10-CM | POA: Diagnosis not present

## 2019-08-21 DIAGNOSIS — K219 Gastro-esophageal reflux disease without esophagitis: Secondary | ICD-10-CM | POA: Diagnosis not present

## 2019-08-21 DIAGNOSIS — I350 Nonrheumatic aortic (valve) stenosis: Secondary | ICD-10-CM | POA: Diagnosis not present

## 2019-08-21 DIAGNOSIS — I480 Paroxysmal atrial fibrillation: Secondary | ICD-10-CM | POA: Diagnosis not present

## 2019-08-21 DIAGNOSIS — R4182 Altered mental status, unspecified: Secondary | ICD-10-CM | POA: Diagnosis not present

## 2019-08-21 DIAGNOSIS — R609 Edema, unspecified: Secondary | ICD-10-CM

## 2019-08-21 DIAGNOSIS — Z823 Family history of stroke: Secondary | ICD-10-CM

## 2019-08-21 DIAGNOSIS — Z6841 Body Mass Index (BMI) 40.0 and over, adult: Secondary | ICD-10-CM | POA: Diagnosis not present

## 2019-08-21 DIAGNOSIS — I21A1 Myocardial infarction type 2: Secondary | ICD-10-CM | POA: Diagnosis present

## 2019-08-21 DIAGNOSIS — I509 Heart failure, unspecified: Secondary | ICD-10-CM | POA: Diagnosis not present

## 2019-08-21 DIAGNOSIS — Z832 Family history of diseases of the blood and blood-forming organs and certain disorders involving the immune mechanism: Secondary | ICD-10-CM

## 2019-08-21 DIAGNOSIS — D72829 Elevated white blood cell count, unspecified: Secondary | ICD-10-CM | POA: Diagnosis present

## 2019-08-21 DIAGNOSIS — R778 Other specified abnormalities of plasma proteins: Secondary | ICD-10-CM | POA: Diagnosis not present

## 2019-08-21 DIAGNOSIS — Z8611 Personal history of tuberculosis: Secondary | ICD-10-CM | POA: Diagnosis not present

## 2019-08-21 DIAGNOSIS — R0602 Shortness of breath: Secondary | ICD-10-CM | POA: Diagnosis not present

## 2019-08-21 DIAGNOSIS — D469 Myelodysplastic syndrome, unspecified: Secondary | ICD-10-CM | POA: Diagnosis present

## 2019-08-21 DIAGNOSIS — R011 Cardiac murmur, unspecified: Secondary | ICD-10-CM | POA: Diagnosis not present

## 2019-08-21 DIAGNOSIS — Z20822 Contact with and (suspected) exposure to covid-19: Secondary | ICD-10-CM | POA: Diagnosis not present

## 2019-08-21 DIAGNOSIS — R52 Pain, unspecified: Secondary | ICD-10-CM | POA: Diagnosis not present

## 2019-08-21 DIAGNOSIS — D471 Chronic myeloproliferative disease: Secondary | ICD-10-CM | POA: Diagnosis not present

## 2019-08-21 DIAGNOSIS — D638 Anemia in other chronic diseases classified elsewhere: Secondary | ICD-10-CM | POA: Diagnosis not present

## 2019-08-21 DIAGNOSIS — R531 Weakness: Secondary | ICD-10-CM

## 2019-08-21 DIAGNOSIS — Z8249 Family history of ischemic heart disease and other diseases of the circulatory system: Secondary | ICD-10-CM

## 2019-08-21 DIAGNOSIS — I5033 Acute on chronic diastolic (congestive) heart failure: Secondary | ICD-10-CM | POA: Diagnosis present

## 2019-08-21 DIAGNOSIS — Z888 Allergy status to other drugs, medicaments and biological substances status: Secondary | ICD-10-CM

## 2019-08-21 DIAGNOSIS — I5031 Acute diastolic (congestive) heart failure: Secondary | ICD-10-CM | POA: Insufficient documentation

## 2019-08-21 DIAGNOSIS — B0229 Other postherpetic nervous system involvement: Secondary | ICD-10-CM | POA: Diagnosis present

## 2019-08-21 DIAGNOSIS — Z833 Family history of diabetes mellitus: Secondary | ICD-10-CM | POA: Diagnosis not present

## 2019-08-21 DIAGNOSIS — E66813 Obesity, class 3: Secondary | ICD-10-CM | POA: Diagnosis present

## 2019-08-21 DIAGNOSIS — M79605 Pain in left leg: Secondary | ICD-10-CM | POA: Diagnosis not present

## 2019-08-21 DIAGNOSIS — Z7982 Long term (current) use of aspirin: Secondary | ICD-10-CM | POA: Diagnosis not present

## 2019-08-21 HISTORY — DX: Unspecified atrial flutter: I48.92

## 2019-08-21 LAB — BASIC METABOLIC PANEL
Anion gap: 11 (ref 5–15)
BUN: 24 mg/dL — ABNORMAL HIGH (ref 8–23)
CO2: 25 mmol/L (ref 22–32)
Calcium: 9.4 mg/dL (ref 8.9–10.3)
Chloride: 101 mmol/L (ref 98–111)
Creatinine, Ser: 1.04 mg/dL — ABNORMAL HIGH (ref 0.44–1.00)
GFR calc Af Amer: >60 mL/min (ref >60)
GFR calc non Af Amer: 52 mL/min — ABNORMAL LOW (ref >60)
Glucose, Bld: 71 mg/dL (ref 70–99)
Potassium: 4.3 mmol/L (ref 3.5–5.1)
Sodium: 137 mmol/L (ref 135–145)

## 2019-08-21 LAB — CBC WITH DIFFERENTIAL/PLATELET
Abs Immature Granulocytes: 8.94 10*3/uL — ABNORMAL HIGH (ref 0.00–0.07)
Basophils Absolute: 0.2 10*3/uL — ABNORMAL HIGH (ref 0.0–0.1)
Basophils Relative: 0 %
Eosinophils Absolute: 0.4 10*3/uL (ref 0.0–0.5)
Eosinophils Relative: 0 %
HCT: 25.9 % — ABNORMAL LOW (ref 36.0–46.0)
Hemoglobin: 8.2 g/dL — ABNORMAL LOW (ref 12.0–15.0)
Immature Granulocytes: 8 %
Lymphocytes Relative: 3 %
Lymphs Abs: 3 10*3/uL (ref 0.7–4.0)
MCH: 30.9 pg (ref 26.0–34.0)
MCHC: 31.7 g/dL (ref 30.0–36.0)
MCV: 97.7 fL (ref 80.0–100.0)
Monocytes Absolute: 3 10*3/uL — ABNORMAL HIGH (ref 0.1–1.0)
Monocytes Relative: 3 %
Neutro Abs: 93.1 10*3/uL — ABNORMAL HIGH (ref 1.7–7.7)
Neutrophils Relative %: 86 %
Platelets: 177 10*3/uL (ref 150–400)
RBC: 2.65 MIL/uL — ABNORMAL LOW (ref 3.87–5.11)
RDW: 17.7 % — ABNORMAL HIGH (ref 11.5–15.5)
WBC: 108.4 10*3/uL (ref 4.0–10.5)
nRBC: 0 % (ref 0.0–0.2)

## 2019-08-21 LAB — ECHOCARDIOGRAM COMPLETE
Height: 68 in
Weight: 4480 oz

## 2019-08-21 LAB — BRAIN NATRIURETIC PEPTIDE: B Natriuretic Peptide: 873.6 pg/mL — ABNORMAL HIGH (ref 0.0–100.0)

## 2019-08-21 LAB — RESPIRATORY PANEL BY RT PCR (FLU A&B, COVID)
Influenza A by PCR: NEGATIVE
Influenza B by PCR: NEGATIVE
SARS Coronavirus 2 by RT PCR: NEGATIVE

## 2019-08-21 LAB — TROPONIN I (HIGH SENSITIVITY)
Troponin I (High Sensitivity): 176 ng/L (ref <18)
Troponin I (High Sensitivity): 50 ng/L — ABNORMAL HIGH (ref <18)

## 2019-08-21 LAB — CBG MONITORING, ED: Glucose-Capillary: 84 mg/dL (ref 70–99)

## 2019-08-21 LAB — CK: Total CK: 32 U/L — ABNORMAL LOW (ref 38–234)

## 2019-08-21 MED ORDER — QUETIAPINE FUMARATE 25 MG PO TABS
12.5000 mg | ORAL_TABLET | Freq: Two times a day (BID) | ORAL | Status: DC | PRN
  Filled 2019-08-21: qty 1

## 2019-08-21 MED ORDER — FUROSEMIDE 20 MG PO TABS: 40.0000 mg | ORAL_TABLET | Freq: Every day | ORAL | Status: DC

## 2019-08-21 MED ORDER — FUROSEMIDE 10 MG/ML IJ SOLN
40.0000 mg | Freq: Two times a day (BID) | INTRAMUSCULAR | Status: DC
  Administered 2019-08-21 – 2019-08-23 (×4): 40 mg via INTRAVENOUS
  Filled 2019-08-21 (×4): qty 4

## 2019-08-21 MED ORDER — ONDANSETRON HCL 4 MG/2ML IJ SOLN: 4.0000 mg | Freq: Four times a day (QID) | INTRAMUSCULAR | Status: DC | PRN

## 2019-08-21 MED ORDER — ASPIRIN EC 81 MG PO TBEC
81.0000 mg | DELAYED_RELEASE_TABLET | Freq: Every day | ORAL | Status: DC
  Administered 2019-08-21 – 2019-08-27 (×7): 81 mg via ORAL
  Filled 2019-08-21 (×7): qty 1

## 2019-08-21 MED ORDER — SODIUM CHLORIDE 0.9 % IV SOLN: 250.0000 mL | INTRAVENOUS | Status: DC | PRN

## 2019-08-21 MED ORDER — QUETIAPINE FUMARATE 25 MG PO TABS
25.0000 mg | ORAL_TABLET | Freq: Every day | ORAL | Status: DC
  Administered 2019-08-21 – 2019-08-26 (×6): 25 mg via ORAL
  Filled 2019-08-21 (×7): qty 1

## 2019-08-21 MED ORDER — DIPHENHYDRAMINE HCL 25 MG PO CAPS: 25.0000 mg | ORAL_CAPSULE | Freq: Three times a day (TID) | ORAL | Status: DC | PRN

## 2019-08-21 MED ORDER — TIZANIDINE HCL 4 MG PO TABS: 4.0000 mg | ORAL_TABLET | Freq: Three times a day (TID) | ORAL | Status: DC | PRN

## 2019-08-21 MED ORDER — FUROSEMIDE 10 MG/ML IJ SOLN
40.0000 mg | Freq: Every day | INTRAMUSCULAR | Status: DC
  Administered 2019-08-21: 40 mg via INTRAVENOUS
  Filled 2019-08-21: qty 4

## 2019-08-21 MED ORDER — SODIUM CHLORIDE 0.9% FLUSH
3.0000 mL | Freq: Two times a day (BID) | INTRAVENOUS | Status: DC
  Administered 2019-08-21 – 2019-08-27 (×12): 3 mL via INTRAVENOUS

## 2019-08-21 MED ORDER — ALBUTEROL SULFATE (2.5 MG/3ML) 0.083% IN NEBU: 3.0000 mL | INHALATION_SOLUTION | Freq: Four times a day (QID) | RESPIRATORY_TRACT | Status: DC | PRN

## 2019-08-21 MED ORDER — SALINE SPRAY 0.65 % NA SOLN
1.0000 | NASAL | Status: DC | PRN
  Filled 2019-08-21: qty 44

## 2019-08-21 MED ORDER — MOMETASONE FURO-FORMOTEROL FUM 200-5 MCG/ACT IN AERO
2.0000 | INHALATION_SPRAY | Freq: Two times a day (BID) | RESPIRATORY_TRACT | Status: DC
  Administered 2019-08-22 – 2019-08-27 (×11): 2 via RESPIRATORY_TRACT
  Filled 2019-08-21: qty 8.8

## 2019-08-21 MED ORDER — ACETAMINOPHEN 325 MG PO TABS
650.0000 mg | ORAL_TABLET | ORAL | Status: DC | PRN
  Administered 2019-08-22 – 2019-08-25 (×3): 650 mg via ORAL
  Filled 2019-08-21 (×3): qty 2

## 2019-08-21 MED ORDER — PRO-STAT SUGAR FREE PO LIQD: 30.0000 mL | Freq: Every day | ORAL | Status: DC

## 2019-08-21 MED ORDER — PRAVASTATIN SODIUM 10 MG PO TABS
20.0000 mg | ORAL_TABLET | Freq: Every day | ORAL | Status: DC
  Administered 2019-08-21 – 2019-08-27 (×7): 20 mg via ORAL
  Filled 2019-08-21 (×7): qty 2

## 2019-08-21 MED ORDER — ALLOPURINOL 300 MG PO TABS
150.0000 mg | ORAL_TABLET | Freq: Two times a day (BID) | ORAL | Status: DC
  Administered 2019-08-21 – 2019-08-27 (×12): 150 mg via ORAL
  Filled 2019-08-21: qty 0.5
  Filled 2019-08-21 (×12): qty 1

## 2019-08-21 MED ORDER — FLUTICASONE PROPIONATE 50 MCG/ACT NA SUSP
1.0000 | Freq: Every day | NASAL | Status: DC
  Administered 2019-08-22 – 2019-08-27 (×6): 1 via NASAL
  Filled 2019-08-21 (×2): qty 16

## 2019-08-21 MED ORDER — APIXABAN 5 MG PO TABS
5.0000 mg | ORAL_TABLET | Freq: Two times a day (BID) | ORAL | Status: DC
  Administered 2019-08-21 – 2019-08-27 (×13): 5 mg via ORAL
  Filled 2019-08-21 (×14): qty 1

## 2019-08-21 MED ORDER — SODIUM CHLORIDE 0.9% FLUSH: 3.0000 mL | INTRAVENOUS | Status: DC | PRN

## 2019-08-21 MED ORDER — ENSURE MAX PROTEIN PO LIQD: 11.0000 [oz_av] | Freq: Two times a day (BID) | ORAL | Status: DC

## 2019-08-21 MED ORDER — PANTOPRAZOLE SODIUM 40 MG PO TBEC
40.0000 mg | DELAYED_RELEASE_TABLET | Freq: Every day | ORAL | Status: DC
  Administered 2019-08-21 – 2019-08-27 (×7): 40 mg via ORAL
  Filled 2019-08-21 (×7): qty 1

## 2019-08-21 MED ORDER — METOPROLOL TARTRATE 50 MG PO TABS
50.0000 mg | ORAL_TABLET | Freq: Two times a day (BID) | ORAL | Status: DC
  Administered 2019-08-21 – 2019-08-27 (×12): 50 mg via ORAL
  Filled 2019-08-21 (×12): qty 1

## 2019-08-21 MED ORDER — GUAIFENESIN-DM 100-10 MG/5ML PO SYRP
5.0000 mL | ORAL_SOLUTION | ORAL | Status: DC | PRN
  Administered 2019-08-21: 5 mL via ORAL
  Filled 2019-08-21: qty 5

## 2019-08-21 NOTE — ED Notes (Signed)
Date and time results received: 08/21/19 0732   Test: WBC Critical Value: 108.4  Name of Provider Notified: Dr. Francia Greaves  Orders Received? Or Actions Taken?: MD aware

## 2019-08-21 NOTE — H&P (Signed)
History and Physical    INAYAH [name] [id number] DOB: October 31, 1940 DOA: 08/21/2019  PCP: [name], MD   Patient coming from: Home  I have personally briefly reviewed patient's old medical records in [place]  Chief Complaint: Feeling weak  HPI: Erika [name] is a 79 y.o. female with medical history significant of asthma, degenerative disc disease, chronic back pain, history of psychosis, hypertension morbid obesity and SVTs, Myeloproliferative disorder who was discharged from rehab yesterday after 20 days of rehab.  Patient however reported she has not felt that her strength ever recovered compared to her baseline.  So she went home, but still unable to support herself and stand on her feet, and she felt her legs were more swelling compared to her baseline.  But she denied any chest pain no short of breath, does have little bit of wheezing but no cough.  She denies any numbness or weakness of her legs.  Denies any dysuria with diarrhea.  She was not sure whether she was on Lasix during rehab center stay.  ED Course: Troponin level level elevated to 176, cardiology is aware, she was found to have fluid overload with 2+ pitting edema, IV Lasix was started.  Review of Systems: As per HPI otherwise 10 point review of systems negative.    Past Medical History:  Diagnosis Date  . Anemia    with menses  . Asthma   . Atrial flutter, paroxysmal ([place])   . Back pain    status post surgery 2002  . Breast cyst    Excesion with FNA, begnin in 2004.   . Degenerative joint disease of spine    Imaging 2005,  Degenerative hypertrophic facet arthritis changes L4-5 and L5-S1..   . History of shingles    Recurrent with post herpetic neuralgia.   [name] Hypertension   . Lymphadenopathy    Of the mediastinum, Right side CXR 2008, not read on 2010 cxr.   . Menopause   . Obesity    BMI 54  . Psychosis (Erika [name])    Secondary to prednisone.  . Shingles   . Stasis dermatitis    W/ LE edema,  prviously on lasix now on mazxide.   . SVT (supraventricular tachycardia) [facility]) June 2009   one run while hospitalized  . Tuberculosis    active TB treated in 2002, hx of paraspinal lumbar TB,     Past Surgical History:  Procedure Laterality Date  . BREAST CYST ASPIRATION Left   . Breast cyst biopsy    . CHOLECYSTECTOMY  02/02/2012   Procedure: LAPAROSCOPIC CHOLECYSTECTOMY;  Surgeon: Zenovia Jarred, MD;  Location: [facility];  Service: General;  Laterality: N/A;  . Hemilaminectomy of L4, L5 and S1 decompression of tumor in epidural space  2000  . IR FLUORO GUIDED NEEDLE PLC ASPIRATION/INJECTION LOC  07/17/2019     reports that she has never smoked. She has never used smokeless tobacco. She reports that she does not drink alcohol or use drugs.  Allergies  Allergen Reactions  . Prednisone Other (See Comments)    REACTION: Psychosis, talking out of head, insomnia    Family History  Problem Relation Age of Onset  . Stroke Mother        at young age  . Heart disease Mother   . Heart attack Father   . Heart disease Father   . Cancer Sister        Unknown  . Stroke Brother   . Stroke Brother   .  Breast cancer Other   . Sickle cell anemia Brother   . Diabetes Brother   . Stroke Brother     Prior to Admission medications   Medication Sig Start Date End Date Taking? Authorizing Provider  ADVAIR DISKUS 500-50 MCG/DOSE AEPB INHALE 1 PUFF BY MOUTH TWICE DAILY Patient taking differently: Inhale 1 puff into the lungs 2 (two) times daily.  03/18/19  Yes [name], MD  albuterol (PROVENTIL HFA;VENTOLIN HFA) 108 (90 Base) MCG/ACT inhaler INHALE 1 TO 2 PUFFS BY MOUTH EVERY 6 HOURS AS NEEDED Patient taking differently: Inhale 1-2 puffs into the lungs every 6 (six) hours as needed for wheezing or shortness of breath.  10/30/18  Yes Brunetta Genera, MD  allopurinol (ZYLOPRIM) 300 MG tablet Take 0.5 tablets (150 mg total) by mouth 2 (two) times daily. 07/28/19 08/27/19 Yes Donne Hazel, MD  amLODipine (NORVASC) 10 MG tablet Take 1 tablet (10 mg total) by mouth daily. Patient taking differently: Take 5 mg by mouth daily.  07/22/19  Yes [name], MD  apixaban (ELIQUIS) 5 MG TABS tablet Take 1 tablet (5 mg total) by mouth 2 (two) times daily. 12/10/18  Yes [name], MD  aspirin EC 81 MG tablet Take 81 mg by mouth daily.   Yes [provider]  diphenhydrAMINE (BENADRYL) 25 MG tablet Take 25 mg by mouth as needed. Once a day prn   Yes [provider]  fluticasone (FLONASE) 50 MCG/ACT nasal spray SHAKE LIQUID AND USE 1 SPRAY IN EACH NOSTRIL DAILY Patient taking differently: Place 1 spray into both nostrils daily.  04/16/19  Yes [name], MD  metoprolol tartrate (LOPRESSOR) 50 MG tablet Take 1 tablet (50 mg total) by mouth 2 (two) times daily. 07/28/19 08/27/19 Yes Donne Hazel, MD  omeprazole (PRILOSEC) 20 MG capsule TAKE 1 CAPSULE BY MOUTH EVERY DAY Patient taking differently: Take 20 mg by mouth daily.  09/29/18  Yes [name], MD  pravastatin (PRAVACHOL) 20 MG tablet Take 1 tablet (20 mg total) by mouth daily. 04/15/19  Yes [name], MD  QUEtiapine (SEROQUEL) 25 MG tablet Take 1 tablet (25 mg total) by mouth at bedtime. Patient taking differently: Take 12.5 mg by mouth at bedtime.  07/28/19 08/27/19 Yes Donne Hazel, MD  sodium chloride (OCEAN) 0.65 % SOLN nasal spray Place 1 spray into both nostrils as needed for congestion. 10/31/17  Yes Lorella Nimrod, MD  Amino Acids-Protein Hydrolys (FEEDING SUPPLEMENT, PRO-STAT SUGAR FREE 64,) LIQD Take 30 mLs by mouth daily. 08/04/19   Danford, Suann Larry, MD  Ensure Max Protein (ENSURE MAX PROTEIN) LIQD Take 330 mLs (11 oz total) by mouth 2 (two) times daily. Patient not taking: Reported on 08/21/2019 08/04/19   Edwin Dada, MD  furosemide (LASIX) 20 MG tablet TAKE 1 TABLET(20 MG) BY MOUTH DAILY AS NEEDED FOR EDEMA Patient not taking: No sig reported 04/14/19   [name], MD   nystatin (MYCOSTATIN/NYSTOP) powder Apply topically 2 (two) times daily. Patient not taking: Reported on 08/21/2019 07/28/19   Donne Hazel, MD  QUEtiapine (SEROQUEL) 25 MG tablet Take 0.5 tablets (12.5 mg total) by mouth 2 (two) times daily as needed (agitation). Patient not taking: Reported on 08/21/2019 07/28/19   Donne Hazel, MD  tiZANidine (ZANAFLEX) 4 MG tablet Take 1 tablet (4 mg total) by mouth every 8 (eight) hours as needed for muscle spasms. Patient not taking: Reported on 08/21/2019 07/28/19   Marylu Lund  K, MD    Physical Exam: Vitals:   08/21/19 1300 08/21/19 1315 08/21/19 1330 08/21/19 1345  BP:   (!) 120/57 113/63  Pulse: 90 91 91 93  Resp: 17 18 19 20   Temp:      TempSrc:      SpO2: 94% 94% 98% 94%  Weight:      Height:        Constitutional: NAD, calm, comfortable Vitals:   08/21/19 1300 08/21/19 1315 08/21/19 1330 08/21/19 1345  BP:   (!) 120/57 113/63  Pulse: 90 91 91 93  Resp: 17 18 19 20   Temp:      TempSrc:      SpO2: 94% 94% 98% 94%  Weight:      Height:       Eyes: PERRL, lids and conjunctivae normal ENMT: Mucous membranes are moist. Posterior pharynx clear of any exudate or lesions.Normal dentition.  Neck: normal, supple, no masses, no thyromegaly Respiratory: clear to auscultation bilaterally, fine crackles on prior to lower fields more in the left. Normal respiratory effort. No accessory muscle use.  Cardiovascular: Soft systolic murmur at apex.  2+ extremity edema. 2+ pedal pulses. No carotid bruits.  Abdomen: no tenderness, no masses palpated. No hepatosplenomegaly. Bowel sounds positive.  Musculoskeletal: no clubbing / cyanosis. No joint deformity upper and lower extremities. Good ROM, no contractures. Normal muscle tone.  Skin: no rashes, lesions, ulcers. No induration Neurologic: CN 2-12 grossly intact. Sensation intact, DTR normal. Strength 5/5 in all 4.  Psychiatric: Normal judgment and insight. Alert and oriented x 3. Normal mood.      Labs on Admission: I have personally reviewed following labs and imaging studies  CBC: Recent Labs  Lab 08/21/19 0609  WBC 108.4*  NEUTROABS 93.1*  HGB 8.2*  HCT 25.9*  MCV 97.7  PLT 123XX123   Basic Metabolic Panel: Recent Labs  Lab 08/21/19 0609  NA 137  K 4.3  CL 101  CO2 25  GLUCOSE 71  BUN 24*  CREATININE 1.04*  CALCIUM 9.4   GFR: Estimated Creatinine Clearance: 64.7 mL/min (A) (by C-G formula based on SCr of 1.04 mg/dL (H)). Liver Function Tests: No results for input(s): AST, ALT, ALKPHOS, BILITOT, PROT, ALBUMIN in the last 168 hours. No results for input(s): LIPASE, AMYLASE in the last 168 hours. No results for input(s): AMMONIA in the last 168 hours. Coagulation Profile: No results for input(s): INR, PROTIME in the last 168 hours. Cardiac Enzymes: Recent Labs  Lab 08/21/19 0609  CKTOTAL 32*   BNP (last 3 results) No results for input(s): PROBNP in the last 8760 hours. HbA1C: No results for input(s): HGBA1C in the last 72 hours. CBG: Recent Labs  Lab 08/21/19 0632  GLUCAP 84   Lipid Profile: No results for input(s): CHOL, HDL, LDLCALC, TRIG, CHOLHDL, LDLDIRECT in the last 72 hours. Thyroid Function Tests: No results for input(s): TSH, T4TOTAL, FREET4, T3FREE, THYROIDAB in the last 72 hours. Anemia Panel: No results for input(s): VITAMINB12, FOLATE, FERRITIN, TIBC, IRON, RETICCTPCT in the last 72 hours. Urine analysis:    Component Value Date/Time   COLORURINE AMBER (A) 08/01/2019 1807   APPEARANCEUR CLOUDY (A) 08/01/2019 1807   LABSPEC 1.023 08/01/2019 1807   PHURINE 5.0 08/01/2019 1807   GLUCOSEU NEGATIVE 08/01/2019 1807   HGBUR MODERATE (A) 08/01/2019 1807   BILIRUBINUR SMALL (A) 08/01/2019 [place] 08/01/2019 1807   PROTEINUR 100 (A) 08/01/2019 1807   UROBILINOGEN 1.0 09/09/2012 0051   NITRITE NEGATIVE 08/01/2019 1807  LEUKOCYTESUR LARGE (A) 08/01/2019 1807    Radiological Exams on Admission: DG Chest Port 1 View   Result Date: 08/21/2019 CLINICAL DATA:  Shortness of breath EXAM: PORTABLE CHEST 1 VIEW COMPARISON:  07/23/2019 FINDINGS: Cardiomegaly and generalized interstitial coarsening which appears similar to before. No discrete Kerley lines. Pulmonary arteries are enlarged at the hila. No effusion or pneumothorax. IMPRESSION: Cardiomegaly and vascular congestion. Chronic pulmonary artery enlargement suggesting pulmonary hypertension. Electronically Signed   By: Monte Fantasia M.D.   On: 08/21/2019 05:18    EKG: Independently reviewed.   Assessment/Plan Active Problems:   CHF (congestive heart failure) (HCC)  Acute on chronic diastolic CHF decompensation, signs of fluid overload with crackles and 2+ edema, agreed with aggressive diuresis and monitor kidney function.  Hold amlodipine for worsening of leg edema.  Positive troponins, likely type II MI secondary to decompensated CHF.  Treat CHF.  Chronic ambulatory dysfunction with acute decompensation, PT/OT evaluation.  Chronic anemia, worked up in December 2020 for hematology evaluation, [place] secondary to chronic myelo proliferative disorder, low reticulocyte count and normal iron study.  Stable.  Paroxysmal A. Fib, controlled, continue Eliquis..  Morbid obesity, outpatient bariatric evaluation.    DVT prophylaxis: Eliquis Code Status: Full code Family Communication: None at bedside Disposition Plan: SNF versus rehab Consults called: Cardiology Admission status: Telemetry admission   Lequita Halt MD [facility] Pager [phone]  If 7PM-7AM, please contact night-coverage [email or web address] Password [facility] [place]  08/21/2019, 2:03 PM

## 2019-08-21 NOTE — Consult Note (Signed)
Cardiology Consult note.    Patient ID: FRONNIE SCHEULEN MRN: [id number]; DOB: 1940/11/21   Admission date: 08/21/2019  Primary Care Provider: [name], MD Primary Cardiologist: New  Chief Complaint:  Weakness   Patient Profile:   Erika [name] is a 79 y.o. female with hx of HTN, remote SVT, paroxysmal atrial flutter on Eliquis, myeloproliferative neoplasm NOS (Followed by Dr. Irene Limbo), obesity, chronic neurophils, chronic back pain s/p laminectomy and  DDD presents for evaluation of weakness and  is being seen today for the evaluation of CHF and elevated troponin  at the request of Dr. Francia Greaves.   Seen by Dr. Angelena Form once 05/2011 for tachycardia. No follow up.    Admitted 05/2017 for leukocytosis. Hospital course complicated by Aflutter RVR. Converted to sinus in IV metoprolol. Started Eliquis for anticoagulation. She was not seen by cardiologist but recommended outpatient follow up. Never happed.   Echo 05/2017 showed LVEF of 50-55%. Mild MR.   Admitted 12/4- 12/22 for possible left-sided sacroiliitis versus septic left sacral iliac joint.  History of Present Illness:   Erika [name] most recently patient was admitted 3 weeks ago with AMS in setting of AKI, dehydration and UTI. Treated with IV fluids and abx. Renal function normalized. Continued home PRN lasix. Discharged rehab facility. She did not received much of therapy at facility except last week. She was discharge home yesterday evening. However, at home she could not move her leg. Says weak and pain in her left leg. Lives with sons who has hx of strong and cann't provide any Environmental consultant. She unable to move her self in her bed and EMS was called. Brought to Er for evaluation.   BNP 873, Hs-troponin 50>>176, CK total 32, SCr 1.04, K 4.3, WBC 108 K/uL, Hgb 8.2 CXR: Cardiomegaly and vascular congestion. Chronic pulmonary artery enlargement suggesting pulmonary hypertension. COVID and influenza panel negative.  Vital stable  She  sleeps on 2 pillows chronically. Has noted LE edema since discharge. Can not recall if she got any lasix at facility. Feels weak. No leg pain since here. She thinks this was positional at home. No chest pain, dyspnea, palpitation of melena.   Heart Pathway Score:     Past Medical History:  Diagnosis Date  . Anemia    with menses  . Asthma   . Atrial flutter, paroxysmal ([place])   . Back pain    status post surgery 2002  . Breast cyst    Excesion with FNA, begnin in 2004.   . Degenerative joint disease of spine    Imaging 2005,  Degenerative hypertrophic facet arthritis changes L4-5 and L5-S1..   . History of shingles    Recurrent with post herpetic neuralgia.   [name] Hypertension   . Lymphadenopathy    Of the mediastinum, Right side CXR 2008, not read on 2010 cxr.   . Menopause   . Obesity    BMI 54  . Psychosis ([place])    Secondary to prednisone.  . Shingles   . Stasis dermatitis    W/ LE edema, prviously on lasix now on mazxide.   . SVT (supraventricular tachycardia) [facility]) June 2009   one run while hospitalized  . Tuberculosis    active TB treated in 2002, hx of paraspinal lumbar TB,     Past Surgical History:  Procedure Laterality Date  . BREAST CYST ASPIRATION Left   . Breast cyst biopsy    . CHOLECYSTECTOMY  02/02/2012   Procedure: LAPAROSCOPIC CHOLECYSTECTOMY;  Surgeon: Merri Ray  Grandville Silos, MD;  Location: [facility];  Service: General;  Laterality: N/A;  . Hemilaminectomy of L4, L5 and S1 decompression of tumor in epidural space  2000  . IR FLUORO GUIDED NEEDLE PLC ASPIRATION/INJECTION LOC  07/17/2019     Medications Prior to Admission: Prior to Admission medications   Medication Sig Start Date End Date Taking? Authorizing Provider  ADVAIR DISKUS 500-50 MCG/DOSE AEPB INHALE 1 PUFF BY MOUTH TWICE DAILY Patient taking differently: Inhale 1 puff into the lungs 2 (two) times daily.  03/18/19   [name], MD  albuterol (PROVENTIL HFA;VENTOLIN HFA) 108 (90 Base) MCG/ACT inhaler  INHALE 1 TO 2 PUFFS BY MOUTH EVERY 6 HOURS AS NEEDED Patient taking differently: Inhale 1-2 puffs into the lungs every 6 (six) hours as needed for wheezing or shortness of breath.  10/30/18   Brunetta Genera, MD  allopurinol (ZYLOPRIM) 300 MG tablet Take 0.5 tablets (150 mg total) by mouth 2 (two) times daily. 07/28/19 08/27/19  Donne Hazel, MD  Amino Acids-Protein Hydrolys (FEEDING SUPPLEMENT, PRO-STAT SUGAR FREE 64,) LIQD Take 30 mLs by mouth daily. 08/04/19   Danford, Suann Larry, MD  amLODipine (NORVASC) 10 MG tablet Take 1 tablet (10 mg total) by mouth daily. 07/22/19   [name], MD  apixaban (ELIQUIS) 5 MG TABS tablet Take 1 tablet (5 mg total) by mouth 2 (two) times daily. 12/10/18   [name], MD  aspirin EC 81 MG tablet Take 81 mg by mouth daily.    [provider]  diphenhydrAMINE (BENADRYL) 25 MG tablet Take 25 mg by mouth as needed. Once a day prn    [provider]  Ensure Max Protein (ENSURE MAX PROTEIN) LIQD Take 330 mLs (11 oz total) by mouth 2 (two) times daily. 08/04/19   Danford, Suann Larry, MD  fluticasone (FLONASE) 50 MCG/ACT nasal spray SHAKE LIQUID AND USE 1 SPRAY IN EACH NOSTRIL DAILY Patient taking differently: Place 1 spray into both nostrils daily.  04/16/19   [name], MD  furosemide (LASIX) 20 MG tablet TAKE 1 TABLET(20 MG) BY MOUTH DAILY AS NEEDED FOR EDEMA Patient taking differently: Take 20 mg by mouth daily as needed for edema.  04/14/19   [name], MD  metoprolol tartrate (LOPRESSOR) 50 MG tablet Take 1 tablet (50 mg total) by mouth 2 (two) times daily. 07/28/19 08/27/19  Donne Hazel, MD  nystatin (MYCOSTATIN/NYSTOP) powder Apply topically 2 (two) times daily. 07/28/19   Donne Hazel, MD  omeprazole (PRILOSEC) 20 MG capsule TAKE 1 CAPSULE BY MOUTH EVERY DAY Patient taking differently: Take 20 mg by mouth daily.  09/29/18   [name], MD  pravastatin (PRAVACHOL) 20 MG tablet Take 1 tablet (20 mg total) by  mouth daily. 04/15/19   [name], MD  QUEtiapine (SEROQUEL) 25 MG tablet Take 0.5 tablets (12.5 mg total) by mouth 2 (two) times daily as needed (agitation). 07/28/19   Donne Hazel, MD  QUEtiapine (SEROQUEL) 25 MG tablet Take 1 tablet (25 mg total) by mouth at bedtime. Patient taking differently: Take 12.5 mg by mouth at bedtime.  07/28/19 08/27/19  Donne Hazel, MD  sodium chloride (OCEAN) 0.65 % SOLN nasal spray Place 1 spray into both nostrils as needed for congestion. Patient not taking: Reported on 03/20/2019 10/31/17   Lorella Nimrod, MD  tiZANidine (ZANAFLEX) 4 MG tablet Take 1 tablet (4 mg total) by mouth every 8 (eight) hours as needed for muscle spasms. 07/28/19  Donne Hazel, MD     Allergies:    Allergies  Allergen Reactions  . Prednisone Other (See Comments)    REACTION: Psychosis, talking out of head, insomnia    Social History:   Social History   Socioeconomic History  . Marital status: Widowed    Spouse name: Not on file  . Number of children: Not on file  . Years of education: Not on file  . Highest education level: Not on file  Occupational History  . 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 Smoking status: Never Smoker  . Smokeless tobacco: Never Used  Substance and Sexual Activity  . Alcohol use: No    Alcohol/week: 0.0 standard drinks  . Drug use: No  . Sexual activity: Not Currently    Birth control/protection: Post-menopausal  Other Topics Concern  . Not on file  Social History Narrative   Current Social History 03/20/2019        Patient lives alone in a ground floor apartment which is 1 story. There are not steps up to the entrance the patient uses. One son stays with her once in a while.      Patient's method of transportation is personal car.      The highest level of education was some high school: 11th grade.      The patient currently retired.      Identified  important Relationships are "My friend Mart Piggs, my nieces, my grandkids, a lot of people.       Pets : None       Interests / Fun: "Playing word games on my phone, watch TV, Talking on phone."       Current Stressors: "Nothing, really. I try not to let anything stress me."       Religious / Personal Beliefs: "I believe in God."       L. Ducatte, RN, BSN    Social Determinants of Health   Financial Resource Strain:   . Difficulty of Paying Living Expenses: Not on file  Food Insecurity:   . Worried About Charity fundraiser in the Last Year: Not on file  . Ran Out of Food in the Last Year: Not on file  Transportation Needs:   . Lack of Transportation (Medical): Not on file  . Lack of Transportation (Non-Medical): Not on file  Physical Activity:   . Days of Exercise per Week: Not on file  . Minutes of Exercise per Session: Not on file  Stress:   . Feeling of Stress : Not on file  Social Connections:   . Frequency of Communication with Friends and Family: Not on file  . Frequency of Social Gatherings with Friends and Family: Not on file  . Attends Religious Services: Not on file  . Active Member of Clubs or Organizations: Not on file  . Attends Archivist Meetings: Not on file  . Marital Status: Not on file  Intimate Partner Violence:   . Fear of Current or Ex-Partner: Not on file  . Emotionally Abused: Not on file  . Physically Abused: Not on file  . Sexually Abused: Not on file    Family History:  The patient's family history includes Breast cancer in an other family member; Cancer in her sister; Diabetes in her brother; Heart attack in her father; Heart disease in her father and mother; Sickle cell anemia in her brother; Stroke in her brother,  brother, brother, and mother.    ROS:  Please see the history of present illness.  All other ROS reviewed and negative.     Physical Exam/Data:   Vitals:   08/21/19 0915 08/21/19 0930 08/21/19 0945 08/21/19 1000    BP: (!) 123/52 (!) 129/52 (!) 119/54 (!) 115/53  Pulse: 86 86 85 86  Resp: 20  18 17   Temp:      TempSrc:      SpO2:  92% 93% 92%  Weight:      Height:       No intake or output data in the 24 hours ending 08/21/19 1013 Last 3 Weights 08/21/2019 08/01/2019 07/13/2019  Weight (lbs) 280 lb 269 lb 6.4 oz 299 lb 13.2 oz  Weight (kg) 127.007 kg 122.2 kg 136 kg     Body mass index is 42.57 kg/m.  General:  Obese female in no acute distress HEENT: normal Lymph: no adenopathy Neck: + JVD Endocrine:  No thryomegaly Vascular: No carotid bruits; FA pulses 2+ bilaterally without bruits  Cardiac:  normal S1, S2; RRR; 3/6 systolic murmur Lungs:  clear to auscultation bilaterally, no wheezing, rhonchi or rales  Abd: soft, nontender, no hepatomegaly  Ext: 1-2+  Edema bilaterally  Musculoskeletal:  No deformities, BUE and BLE strength normal and equal Skin: warm and dry  Neuro:  CNs 2-12 intact, no focal abnormalities noted Psych:  Normal affect    EKG:  The ECG that was done 08/21/19  was personally reviewed and demonstrates SR at rate of 90 bpm  Relevant CV Studies: Echo 05/2017 ------------------------------------------------------------------- Study Conclusions  - Left ventricle: The cavity size was normal. Wall thickness was   increased in a pattern of mild LVH. Systolic function was normal.   The estimated ejection fraction was in the range of 50% to 55%.   Although no diagnostic regional wall motion abnormality was   identified, this possibility cannot be completely excluded on the   basis of this study. - Mitral valve: There was mild regurgitation. - Left atrium: The atrium was mildly dilated.  Laboratory Data:  High Sensitivity Troponin:   Recent Labs  Lab 08/21/19 0609  TROPONINIHS 176*      Chemistry Recent Labs  Lab 08/21/19 0609  NA 137  K 4.3  CL 101  CO2 25  GLUCOSE 71  BUN 24*  CREATININE 1.04*  CALCIUM 9.4  GFRNONAA 52*  GFRAA >60  ANIONGAP 11    Hematology Recent Labs  Lab 08/21/19 0609  WBC 108.4*  RBC 2.65*  HGB 8.2*  HCT 25.9*  MCV 97.7  MCH 30.9  MCHC 31.7  RDW 17.7*  PLT 177   BNP Recent Labs  Lab 08/21/19 0609  BNP 873.6*     Radiology/Studies:  DG Chest Port 1 View  Result Date: 08/21/2019 CLINICAL DATA:  Shortness of breath EXAM: PORTABLE CHEST 1 VIEW COMPARISON:  07/23/2019 FINDINGS: Cardiomegaly and generalized interstitial coarsening which appears similar to before. No discrete Kerley lines. Pulmonary arteries are enlarged at the hila. No effusion or pneumothorax. IMPRESSION: Cardiomegaly and vascular congestion. Chronic pulmonary artery enlargement suggesting pulmonary hypertension. Electronically Signed   By: Monte Fantasia M.D.   On: 08/21/2019 05:18    Assessment and Plan:   1. Bilateral lower extremity edema/ presumed acute CHF exacerbation - Patient has evidence of volume overload by exam and labs. BNP 873. CXR with vascular congestion. Noted worsening edema since discharged to facility. Takes PRN lasix but unsure if it was given to facility  or note. Echo in 2018 showed LVEF of 50-55% and mild MR. Will update echo. Admit and start lasix 40mg  BID. Strict I & O.   2. Myeloproliferative neoplasm NOS - Followed by Dr. Irene Limbo - Since last month her WBC is ~100K.  - Hgb 8.2 today (down from recent labs)  3. Elevated troponin - Cycle enzyme. EKG without ischemic changes. No chest pain.   4. Paroxysmal atrial flutter - Solitary episode in 2018. On Eliquis for anticoagulation. CHADSVASCs score of 4 for Age, HTN and sex. Maintaining sinus rhythm.   5. L leg pain - Recent long admission for possible left-sided sacroiliitis versus septic left sacral iliac joint.   For questions or updates, please contact [facility] Please consult [email or web address] for contact info under        Jarrett Soho, PA  08/21/2019 10:13 AM

## 2019-08-21 NOTE — Progress Notes (Addendum)
Physical Therapy Evaluation Patient Details Name: Erika [name] MRN: [id number] DOB: 1941-04-23 Today's Date: 08/21/2019   History of Present Illness  Erika [name] is a 79 y.o. female with hx of HTN, remote SVT, paroxysmal atrial flutter on Eliquis, myeloproliferative neoplasm NOS (Followed by Dr. Irene Limbo), obesity, chronic neurophils, chronic back pain s/p laminectomy and  DDD presents weakness, inability to care for self, for the evaluation of CHF and elevated troponin. Discharged home one day from SNF.  Clinical Impression  Pt evaluated in ED; pleasant and willing to work with therapy. Unfortunately, pt states when she was at [facility], she did not receive PT until her last week there. She has not stood without ambulation equipment since prior to admission in mid December. When she was discharged home from SNF, her sons were unable to care for her. On PT evaluation, pt presents with gross weakness and increased edema in bilateral lower extremities. Requiring moderate assist for bed mobility; unable to transfer to standing from stretcher and with one assist. Would benefit from post acute rehab for intensive strengthening and transfer training.     Follow Up Recommendations Post Acute Rehab (would CIR be an option for this pt? Unable to transfer at home and not receiving rehab at [facility])    Equipment Recommendations  3in1 (PT) (bariatric)   Recommendations for Other Services       Precautions / Restrictions Precautions Precautions: Fall Restrictions Weight Bearing Restrictions: No      Mobility  Bed Mobility Overal bed mobility: Needs Assistance Bed Mobility: Supine to Sit;Sit to Supine Rolling: Min assist   Supine to sit: Mod assist Sit to supine: Mod assist   General bed mobility comments: MinA to roll to both sides in order to remove wet bed pad. ModA for BLE negotiation with sitting up; good use of arms  Transfers                 General transfer comment: unable; on  stretcher in ED  Ambulation/Gait                Stairs            Wheelchair Mobility    Modified Rankin (Stroke Patients Only)       Balance Overall balance assessment: Needs assistance Sitting-balance support: Feet unsupported Sitting balance-Leahy Scale: Good                                       Pertinent Vitals/Pain Pain Assessment: Faces Faces Pain Scale: Hurts even more Pain Location: bilateral knees with movement Pain Descriptors / Indicators: Grimacing Pain Intervention(s): Monitored during session;Repositioned    Home Living Family/patient expects to be discharged to:: Private residence Living Arrangements: Children Available Help at Discharge: Family;Available PRN/intermittently Type of Home: Apartment Home Access: Level entry     Home Layout: One level Home Equipment: Walker - 4 wheels;Other (comment);Hospital bed;Wheelchair - manual(hoyer lift) Additional Comments: lives with one son, second son comes by to help out, adult grandaughter helps with housecleaning. Both sons work during the day    Prior Function Level of Independence: Needs assistance   Gait / Transfers Assistance Needed: prior to 07/19/2019, pt ambulating with a walker. Since stay at SNF, has been unable to stand. Using stedy to transfer  ADL's / Homemaking Assistance Needed: Requiring assist for ADL's from staff at [facility]  Dominant Hand: Right    Extremity/Trunk Assessment   Upper Extremity Assessment Upper Extremity Assessment: RUE deficits/detail;LUE deficits/detail RUE Deficits / Details: Strength 5/5 LUE Deficits / Details: Strength 5/5    Lower Extremity Assessment Lower Extremity Assessment: RLE deficits/detail;LLE deficits/detail RLE Deficits / Details: Grossly 2/5 strength LLE Deficits / Details: Grossly 2/5 strength    Cervical / Trunk Assessment Cervical / Trunk Assessment: Other exceptions Cervical / Trunk  Exceptions: large body habitus  Communication   Communication: No difficulties  Cognition Arousal/Alertness: Awake/alert Behavior During Therapy: WFL for tasks assessed/performed Overall Cognitive Status: Within Functional Limits for tasks assessed                                        General Comments      Exercises     Assessment/Plan    PT Assessment Patient needs continued PT services  PT Problem List Decreased strength;Decreased mobility;Decreased balance;Decreased activity tolerance;Decreased knowledge of use of DME       PT Treatment Interventions DME instruction;Therapeutic activities;Therapeutic exercise;Gait training;Functional mobility training;Balance training    PT Goals (Current goals can be found in the Care Plan section)  Acute Rehab PT Goals Patient Stated Goal: to be able to stand PT Goal Formulation: With patient Time For Goal Achievement: 09/04/19 Potential to Achieve Goals: Fair    Frequency Min 3X/week   Barriers to discharge        Co-evaluation               AM-PAC PT "6 Clicks" Mobility  Outcome Measure Help needed turning from your back to your side while in a flat bed without using bedrails?: A Little Help needed moving from lying on your back to sitting on the side of a flat bed without using bedrails?: A Lot Help needed moving to and from a bed to a chair (including a wheelchair)?: A Lot Help needed standing up from a chair using your arms (e.g., wheelchair or bedside chair)?: Total Help needed to walk in hospital room?: Total Help needed climbing 3-5 steps with a railing? : Total 6 Click Score: 10    End of Session   Activity Tolerance: Patient tolerated treatment well Patient left: in bed;with call bell/phone within reach   PT Visit Diagnosis: Muscle weakness (generalized) (M62.81);Difficulty in walking, not elsewhere classified (R26.2)    Time: [id number] PT Time Calculation (min) (ACUTE ONLY): 30  min   Charges:   PT Evaluation $PT Eval Moderate Complexity: 1 Mod PT Treatments $Therapeutic Activity: 8-22 mins        Ellamae Sia, PT, DPT Acute Rehabilitation Services Pager [phone] Office [phone]   Willy Eddy 08/21/2019, 5:08 PM

## 2019-08-21 NOTE — ED Provider Notes (Signed)
[facility] EMERGENCY DEPARTMENT Provider Note   CSN: [id number] Arrival date & time: 08/21/19  0445     History Chief Complaint  Patient presents with  . Leg Pain    Erika [name] is a 79 y.o. female.  Patient presents to the emergency department for evaluation of multiple problems.  Patient was recently hospitalized for dehydration and urinary tract infection.  Upon discharge she went to a nursing home for rehab.  She was apparently found to be unable to perform the physical therapy and was discharged back to home from the nursing home yesterday pending 24-hour home care.  She reports that no one came to her house and she has been lying in the bed since she was brought home, unable to get up or move.  She lives with her son but he has had a stroke and she is his caregiver.  She called for an ambulance this morning because she has been unable to get off the bed since yesterday.  While trying to get up this morning she started to experience moderate to severe pain in her upper thigh when she moved.        Past Medical History:  Diagnosis Date  . Anemia    with menses  . Asthma   . Back pain    status post surgery 2002  . Breast cyst    Excesion with FNA, begnin in 2004.   . Degenerative joint disease of spine    Imaging 2005,  Degenerative hypertrophic facet arthritis changes L4-5 and L5-S1..   . History of shingles    Recurrent with post herpetic neuralgia.   [name] Hypertension   . Lymphadenopathy    Of the mediastinum, Right side CXR 2008, not read on 2010 cxr.   . Menopause   . Obesity    BMI 54  . Psychosis ([place])    Secondary to prednisone.  . Shingles   . Stasis dermatitis    W/ LE edema, prviously on lasix now on mazxide.   . SVT (supraventricular tachycardia) [facility]) June 2009   one run while hospitalized  . Tuberculosis    active TB treated in 2002, hx of paraspinal lumbar TB,     Patient Active Problem List   Diagnosis Date Noted  . AMS  (altered mental status) 08/01/2019  . Acute lower UTI 08/01/2019  . Agitation 07/24/2019  . Tumor lysis syndrome   . Hyperuricemia   . Thrombocytopenia ([place])   . Myeloproliferative disorder ([place]) 07/10/2019  . AKI (acute kidney injury) ([place]) 07/10/2019  . AF (paroxysmal atrial fibrillation) ([place]) 06/02/2017  . Leukocytosis 05/29/2017  . Anemia 05/29/2017  . Leg swelling 02/23/2016  . Hand cramps 11/22/2015  . GERD (gastroesophageal reflux disease) 02/09/2013  . OA (osteoarthritis) of knee 01/29/2012  . OA (osteoarthritis of spine) 10/18/2011  . Healthcare maintenance 04/25/2011  . Hypokalemia 02/26/2008  . Moderate persistent asthma 02/10/2008  . Hyperlipidemia 08/04/2007  . H/O Herpes zoster 08/29/2006  . Obesity, Class III, BMI 40-49.9 (morbid obesity) ([place]) 08/29/2006  . History of breast cyst 08/29/2006  . Essential hypertension 08/27/2006  . 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 BREAST CYST ASPIRATION Left   . Breast cyst biopsy    . CHOLECYSTECTOMY  02/02/2012   Procedure: LAPAROSCOPIC CHOLECYSTECTOMY;  Surgeon: Zenovia Jarred, MD;  Location: [facility];  Service: General;  Laterality: N/A;  . Hemilaminectomy of L4, L5 and S1 decompression of tumor in epidural space  2000  . IR FLUORO GUIDED NEEDLE PLC ASPIRATION/INJECTION LOC  07/17/2019     OB History   No obstetric history on file.     Family History  Problem Relation Age of Onset  . Stroke Mother        at young age  . Heart disease Mother   . Heart attack Father   . Heart disease Father   . Cancer Sister        Unknown  . Stroke Brother   . Stroke Brother   . Breast cancer Other   . Sickle cell anemia Brother   . Diabetes Brother   . Stroke Brother     Social History   Tobacco Use  . Smoking status: Never Smoker  . Smokeless tobacco: Never Used    Substance Use Topics  . Alcohol use: No    Alcohol/week: 0.0 standard drinks  . Drug use: No    Home Medications Prior to Admission medications   Medication Sig Start Date End Date Taking? Authorizing Provider  ADVAIR DISKUS 500-50 MCG/DOSE AEPB INHALE 1 PUFF BY MOUTH TWICE DAILY Patient taking differently: Inhale 1 puff into the lungs 2 (two) times daily.  03/18/19   [name], MD  albuterol (PROVENTIL HFA;VENTOLIN HFA) 108 (90 Base) MCG/ACT inhaler INHALE 1 TO 2 PUFFS BY MOUTH EVERY 6 HOURS AS NEEDED Patient taking differently: Inhale 1-2 puffs into the lungs every 6 (six) hours as needed for wheezing or shortness of breath.  10/30/18   Brunetta Genera, MD  allopurinol (ZYLOPRIM) 300 MG tablet Take 0.5 tablets (150 mg total) by mouth 2 (two) times daily. 07/28/19 08/27/19  Donne Hazel, MD  Amino Acids-Protein Hydrolys (FEEDING SUPPLEMENT, PRO-STAT SUGAR FREE 64,) LIQD Take 30 mLs by mouth daily. 08/04/19   Danford, Suann Larry, MD  amLODipine (NORVASC) 10 MG tablet Take 1 tablet (10 mg total) by mouth daily. 07/22/19   [name], MD  apixaban (ELIQUIS) 5 MG TABS tablet Take 1 tablet (5 mg total) by mouth 2 (two) times daily. 12/10/18   [name], MD  aspirin EC 81 MG tablet Take 81 mg by mouth daily.    [provider]  diphenhydrAMINE (BENADRYL) 25 MG tablet Take 25 mg by mouth as needed. Once a day prn    [provider]  Ensure Max Protein (ENSURE MAX PROTEIN) LIQD Take 330 mLs (11 oz total) by mouth 2 (two) times daily. 08/04/19   Danford, Suann Larry, MD  fluticasone (FLONASE) 50 MCG/ACT nasal spray SHAKE LIQUID AND USE 1 SPRAY IN EACH NOSTRIL DAILY Patient taking differently: Place 1 spray into both nostrils daily.  04/16/19   [name], MD  furosemide (LASIX) 20 MG tablet TAKE 1 TABLET(20 MG) BY MOUTH DAILY AS NEEDED FOR EDEMA Patient taking differently: Take 20 mg by mouth daily as needed for edema.  04/14/19   [name], MD   metoprolol tartrate (LOPRESSOR) 50 MG tablet Take 1 tablet (50 mg total) by mouth 2 (two) times daily. 07/28/19 08/27/19  Donne Hazel, MD  nystatin (MYCOSTATIN/NYSTOP) powder Apply topically 2 (two) times daily. 07/28/19   Donne Hazel, MD  omeprazole (PRILOSEC) 20 MG capsule TAKE 1 CAPSULE BY MOUTH EVERY DAY Patient taking differently: Take 20 mg by mouth daily.  09/29/18   [name], MD  pravastatin (PRAVACHOL) 20 MG tablet Take 1  tablet (20 mg total) by mouth daily. 04/15/19   [name], MD  QUEtiapine (SEROQUEL) 25 MG tablet Take 0.5 tablets (12.5 mg total) by mouth 2 (two) times daily as needed (agitation). 07/28/19   Donne Hazel, MD  QUEtiapine (SEROQUEL) 25 MG tablet Take 1 tablet (25 mg total) by mouth at bedtime. Patient taking differently: Take 12.5 mg by mouth at bedtime.  07/28/19 08/27/19  Donne Hazel, MD  sodium chloride (OCEAN) 0.65 % SOLN nasal spray Place 1 spray into both nostrils as needed for congestion. Patient not taking: Reported on 03/20/2019 10/31/17   Lorella Nimrod, MD  tiZANidine (ZANAFLEX) 4 MG tablet Take 1 tablet (4 mg total) by mouth every 8 (eight) hours as needed for muscle spasms. 07/28/19   Donne Hazel, MD    Allergies    Prednisone  Review of Systems   Review of Systems  Respiratory: Positive for shortness of breath.   All other systems reviewed and are negative.   Physical Exam Updated Vital Signs BP 140/64 (BP Location: Right Arm)   Pulse (!) 102   Temp 98.1 F (36.7 C) (Oral)   Resp (!) 21   Ht 5\' 8"  (1.727 m)   Wt 127 kg   SpO2 95%   BMI 42.57 kg/m   Physical Exam Vitals and nursing note reviewed.  Constitutional:      General: She is not in acute distress.    Appearance: She is well-developed. She is obese.  HENT:     Head: Normocephalic and atraumatic.     Right Ear: Hearing normal.     Left Ear: Hearing normal.     Nose: Nose normal.  Eyes:     Conjunctiva/sclera: Conjunctivae normal.     Pupils:  Pupils are equal, round, and reactive to light.  Cardiovascular:     Rate and Rhythm: Regular rhythm.     Heart sounds: S1 normal and S2 normal. No murmur. No friction rub. No gallop.   Pulmonary:     Effort: Pulmonary effort is normal. Tachypnea present. No respiratory distress.     Breath sounds: Wheezing present.  Chest:     Chest wall: No tenderness.  Abdominal:     General: Bowel sounds are normal.     Palpations: Abdomen is soft.     Tenderness: There is no abdominal tenderness. There is no guarding or rebound. Negative signs include Murphy's sign and McBurney's sign.     Hernia: No hernia is present.  Musculoskeletal:        General: Normal range of motion.     Cervical back: Normal range of motion and neck supple.  Skin:    General: Skin is warm and dry.     Findings: No rash.  Neurological:     Mental Status: She is alert and oriented to person, place, and time.     GCS: GCS eye subscore is 4. GCS verbal subscore is 5. GCS motor subscore is 6.     Cranial Nerves: No cranial nerve deficit.     Sensory: No sensory deficit.     Coordination: Coordination normal.  Psychiatric:        Speech: Speech normal.        Behavior: Behavior normal.        Thought Content: Thought content normal.     ED Results / Procedures / Treatments   Labs (all labs ordered are listed, but only abnormal results are displayed) Labs Reviewed  CBC WITH DIFFERENTIAL/PLATELET -  Abnormal; Notable for the following components:      Result Value   RBC 2.65 (*)    Hemoglobin 8.2 (*)    HCT 25.9 (*)    RDW 17.7 (*)    All other components within normal limits  BASIC METABOLIC PANEL  BRAIN NATRIURETIC PEPTIDE  CK  CBG MONITORING, ED  TROPONIN I (HIGH SENSITIVITY)  TROPONIN I (HIGH SENSITIVITY)    EKG None ED ECG REPORT   Date: 08/21/2019  Rate: 92  Rhythm: normal sinus rhythm  QRS Axis: normal  Intervals: normal  ST/T Wave abnormalities: normal  Conduction Disutrbances:none   Narrative Interpretation:   Old EKG Reviewed: unchanged  I have personally reviewed the EKG tracing and agree with the computerized printout as noted.  Radiology DG Chest Port 1 View  Result Date: 08/21/2019 CLINICAL DATA:  Shortness of breath EXAM: PORTABLE CHEST 1 VIEW COMPARISON:  07/23/2019 FINDINGS: Cardiomegaly and generalized interstitial coarsening which appears similar to before. No discrete Kerley lines. Pulmonary arteries are enlarged at the hila. No effusion or pneumothorax. IMPRESSION: Cardiomegaly and vascular congestion. Chronic pulmonary artery enlargement suggesting pulmonary hypertension. Electronically Signed   By: Monte Fantasia M.D.   On: 08/21/2019 05:18    Procedures Procedures (including critical care time)  Medications Ordered in ED Medications - No data to display  ED Course  I have reviewed the triage vital signs and the nursing notes.  Pertinent labs & imaging results that were available during my care of the patient were reviewed by me and considered in my medical decision making (see chart for details).    MDM Rules/Calculators/A&P                      Patient presents to the emergency department for evaluation of leg pain.  Patient was just discharged yesterday from skilled nursing facility where she was rehabbing after a hospital stay.  When she got home she was unable to care for herself.  She has not been able to get out of bed since she was placed there upon arriving home.  She was supposed to be getting 24-hour care but no one has showed up at home to care for her.  She was struggling to try to get out of bed and had some pain in the left thigh.  This appears to be soft tissue in nature, no evidence of any bone injury or hip injury.  Will perform basic work-up to rule out any underlying condition but suspect will need social work consult. Will sign out to oncoming ER physician. Final Clinical Impression(s) / ED Diagnoses Final diagnoses:  General  weakness    Rx / DC Orders ED Discharge Orders    None       Palmira Stickle, Gwenyth Allegra, MD 08/21/19 0700

## 2019-08-21 NOTE — Discharge Planning (Signed)
[facility] contacted son, [name] regarding disposition goals and expections for pt/.  Son states he wants the safest disposition possible.  Awaiting PT/OT evaluation for disposition recommendations.

## 2019-08-21 NOTE — ED Notes (Addendum)
ED TO INPATIENT HANDOFF REPORT  ED Nurse Name and Phone #:   S Name/Age/Gender Erika [name] 79 y.o. female Room/Bed: 020C/020C  Code Status   Code Status: Full Code  Home/SNF/Other Home Patient oriented to: self, place, time and situation Is this baseline? Yes   Triage Complete: Triage complete  Chief Complaint CHF (congestive heart failure) ([place]) [I50.9]  Triage Note Patient presents to ED via [facility] from home states she was discharged from [facility]. States she has been laying in her bed since d/c. Patient had a depends on and was incont of urine and stool. States her son lives with her however she came in today because she knew she would be alone after her son went to work and she couldn't do anything for herself. Patient is alert oriented.     Allergies Allergies  Allergen Reactions  . Prednisone Other (See Comments)    REACTION: Psychosis, talking out of head, insomnia    Level of Care/Admitting Diagnosis ED Disposition    ED Disposition Condition [place]: [place] [[id number]]  Level of Care: Telemetry Medical [104]  Covid Evaluation: Asymptomatic Screening Protocol (No Symptoms)  Diagnosis: CHF (congestive heart failure) [facility]) [id number]  Admitting Physician: Lequita Halt [id number]  Attending Physician: Lequita Halt [id number]  Estimated length of stay: 3 - 4 days  Certification:: I certify this patient will need inpatient services for at least 2 midnights       B Medical/Surgery History Past Medical History:  Diagnosis Date  . Anemia    with menses  . Asthma   . Atrial flutter, paroxysmal ([place])   . Back pain    status post surgery 2002  . Breast cyst    Excesion with FNA, begnin in 2004.   . Degenerative joint disease of spine    Imaging 2005,  Degenerative hypertrophic facet arthritis changes L4-5 and L5-S1..   . History of shingles    Recurrent with post herpetic neuralgia.   [name]  Hypertension   . Lymphadenopathy    Of the mediastinum, Right side CXR 2008, not read on 2010 cxr.   . Menopause   . Obesity    BMI 54  . Psychosis ([place])    Secondary to prednisone.  . Shingles   . Stasis dermatitis    W/ LE edema, prviously on lasix now on mazxide.   . SVT (supraventricular tachycardia) [facility]) June 2009   one run while hospitalized  . Tuberculosis    active TB treated in 2002, hx of paraspinal lumbar TB,    Past Surgical History:  Procedure Laterality Date  . BREAST CYST ASPIRATION Left   . Breast cyst biopsy    . CHOLECYSTECTOMY  02/02/2012   Procedure: LAPAROSCOPIC CHOLECYSTECTOMY;  Surgeon: Zenovia Jarred, MD;  Location: [place];  Service: General;  Laterality: N/A;  . Hemilaminectomy of L4, L5 and S1 decompression of tumor in epidural space  2000  . IR FLUORO GUIDED NEEDLE PLC ASPIRATION/INJECTION LOC  07/17/2019     A IV Location/Drains/Wounds Patient Lines/Drains/Airways Status   Active Line/Drains/Airways    Name:   Placement date:   Placement time:   Site:   Days:   Peripheral IV 08/21/19 Left Antecubital   08/21/19    0804    Antecubital   less than 1   External Urinary Catheter   08/21/19    0558    --   less than 1  Incision 02/02/12 Abdomen Other (Comment)   02/02/12    1310     2757          Intake/Output Last 24 hours  Intake/Output Summary (Last 24 hours) at 08/21/2019 1714 Last data filed at 08/21/2019 1320 Gross per 24 hour  Intake --  Output 1650 ml  Net -1650 ml    Labs/Imaging Results for orders placed or performed during the hospital encounter of 08/21/19 (from the past 48 hour(s))  CBC with Differential     Status: Abnormal   Collection Time: 08/21/19  6:09 AM  Result Value Ref Range   WBC 108.4 (HH) 4.0 - 10.5 K/uL    Comment: REPEATED TO VERIFY WHITE COUNT CONFIRMED ON SMEAR THIS CRITICAL RESULT HAS VERIFIED AND BEEN CALLED TO N. COHUT, RN BY JULIE MACEDA DEL ANGEL ON 01 15 2021 AT 0732, AND HAS BEEN READ BACK.  THIS  CRITICAL RESULT HAS VERIFIED AND BEEN CALLED TO N. KOHUT, RN BY JULIE MACEDA DEL ANGEL ON 01 15 2021 AT 0732, AND HAS BEEN READ BACK.     RBC 2.65 (L) 3.87 - 5.11 MIL/uL   Hemoglobin 8.2 (L) 12.0 - 15.0 g/dL   HCT 25.9 (L) 36.0 - 46.0 %   MCV 97.7 80.0 - 100.0 fL   MCH 30.9 26.0 - 34.0 pg   MCHC 31.7 30.0 - 36.0 g/dL   RDW 17.7 (H) 11.5 - 15.5 %   Platelets 177 150 - 400 K/uL   nRBC 0.0 0.0 - 0.2 %   Neutrophils Relative % 86 %   Neutro Abs 93.1 (H) 1.7 - 7.7 K/uL   Lymphocytes Relative 3 %   Lymphs Abs 3.0 0.7 - 4.0 K/uL   Monocytes Relative 3 %   Monocytes Absolute 3.0 (H) 0.1 - 1.0 K/uL   Eosinophils Relative 0 %   Eosinophils Absolute 0.4 0.0 - 0.5 K/uL   Basophils Relative 0 %   Basophils Absolute 0.2 (H) 0.0 - 0.1 K/uL   WBC Morphology DOHLE BODIES     Comment: MODERATE LEFT SHIFT (>5% METAS AND MYELOS,OCC PRO NOTED) TOXIC GRANULATION    Immature Granulocytes 8 %   Abs Immature Granulocytes 8.94 (H) 0.00 - 0.07 K/uL    Comment: Performed at [facility], [address] [place], [place] [id number]  Basic metabolic panel     Status: Abnormal   Collection Time: 08/21/19  6:09 AM  Result Value Ref Range   Sodium 137 135 - 145 mmol/L   Potassium 4.3 3.5 - 5.1 mmol/L   Chloride 101 98 - 111 mmol/L   CO2 25 22 - 32 mmol/L   Glucose, Bld 71 70 - 99 mg/dL   BUN 24 (H) 8 - 23 mg/dL   Creatinine, Ser 1.04 (H) 0.44 - 1.00 mg/dL   Calcium 9.4 8.9 - 10.3 mg/dL   GFR calc non Af Amer 52 (L) >60 mL/min   GFR calc Af Amer >60 >60 mL/min   Anion gap 11 5 - 15    Comment: Performed at [facility], [place] [address] [place], [place] [address]  Brain natriuretic peptide     Status: Abnormal   Collection Time: 08/21/19  6:09 AM  Result Value Ref Range   B Natriuretic Peptide 873.6 (H) 0.0 - 100.0 pg/mL    Comment: Performed at [facility] [address], [place],  [address]  Troponin I (High Sensitivity)     Status: Abnormal   Collection Time: 08/21/19  6:09  AM  Result Value Ref Range   Troponin I (High Sensitivity) 176 (HH) <18 ng/L    Comment: CRITICAL RESULT CALLED TO, READ BACK BY AND VERIFIED WITHRhona Leavens RN [phone] BY A BENNETT (NOTE) Elevated high sensitivity troponin I (hsTnI) values and significant  changes across serial measurements may suggest ACS but many other  chronic and acute conditions are known to elevate hsTnI results.  Refer to the Links section for chest pain algorithms and additional  guidance. Performed at [facility], [place] [address], [place], [place] [address]   CK     Status: Abnormal   Collection Time: 08/21/19  6:09 AM  Result Value Ref Range   Total CK 32 (L) 38 - 234 U/L    Comment: Performed at [facility], [place] [address], [place], [place] [address]  POC CBG, ED     Status: None   Collection Time: 08/21/19  6:32 AM  Result Value Ref Range   Glucose-Capillary 84 70 - 99 mg/dL  Troponin I (High Sensitivity)     Status: Abnormal   Collection Time: 08/21/19  8:08 AM  Result Value Ref Range   Troponin I (High Sensitivity) 50 (H) <18 ng/L    Comment: DELTA CHECK NOTED (NOTE) Elevated high sensitivity troponin I (hsTnI) values and significant  changes across serial measurements may suggest ACS but many other  chronic and acute conditions are known to elevate hsTnI results.  Refer to the Links section for chest pain algorithms and additional  guidance. Performed at [facility], [place] [address], [place], [place] [address]   Respiratory Panel by RT PCR (Flu A&B, Covid) - Nasopharyngeal Swab     Status: None   Collection Time: 08/21/19  8:08 AM   Specimen: Nasopharyngeal Swab  Result Value Ref Range   SARS Coronavirus 2 by RT PCR NEGATIVE NEGATIVE    Comment: (NOTE) SARS-CoV-2 target nucleic acids are NOT DETECTED. The SARS-CoV-2 RNA is generally detectable in upper respiratoy specimens during the acute phase of infection. The lowest concentration of SARS-CoV-2 viral copies this  assay can detect is 131 copies/mL. A negative result does not preclude SARS-Cov-2 infection and should not be used as the sole basis for treatment or other patient management decisions. A negative result may occur with  improper specimen collection/handling, submission of specimen other than nasopharyngeal swab, presence of viral mutation(s) within the areas targeted by this assay, and inadequate number of viral copies (<131 copies/mL). A negative result must be combined with clinical observations, patient history, and epidemiological information. The expected result is Negative. Fact Sheet for Patients:  PinkCheek.be Fact Sheet for Healthcare Providers:  GravelBags.it This test is not yet ap proved or cleared by the [place] FDA and  has been authorized for detection and/or diagnosis of SARS-CoV-2 by FDA under an Emergency Use Authorization (EUA). This EUA will remain  in effect (meaning this test can be used) for the duration of the COVID-19 declaration under Section 564(b)(1) of the Act, 21 U.S.C. section 360bbb-3(b)(1), unless the authorization is terminated or revoked sooner.    Influenza A by PCR NEGATIVE NEGATIVE   Influenza B by PCR NEGATIVE NEGATIVE    Comment: (NOTE) The Xpert Xpress SARS-CoV-2/FLU/RSV assay is intended as an aid in  the diagnosis of influenza from Nasopharyngeal swab specimens and  should not be used as a sole basis for treatment. Nasal washings and  aspirates are unacceptable for Xpert Xpress SARS-CoV-2/FLU/RSV  testing. Fact  Sheet for Patients: PinkCheek.be Fact Sheet for Healthcare Providers: GravelBags.it This test is not yet approved or cleared by the [place] FDA and  has been authorized for detection and/or diagnosis of SARS-CoV-2 by  FDA under an Emergency Use Authorization (EUA). This EUA will remain  in effect (meaning  this test can be used) for the duration of the  Covid-19 declaration under Section 564(b)(1) of the Act, 21  U.S.C. section 360bbb-3(b)(1), unless the authorization is  terminated or revoked. Performed at [facility], [place] [address] [place], [place] [address]    DG Chest Port 1 View  Result Date: 08/21/2019 CLINICAL DATA:  Shortness of breath EXAM: PORTABLE CHEST 1 VIEW COMPARISON:  07/23/2019 FINDINGS: Cardiomegaly and generalized interstitial coarsening which appears similar to before. No discrete [place] lines. Pulmonary arteries are enlarged at the hila. No effusion or pneumothorax. IMPRESSION: Cardiomegaly and vascular congestion. Chronic pulmonary artery enlargement suggesting pulmonary hypertension. Electronically Signed   By: Monte Fantasia M.D.   On: 08/21/2019 05:18    Pending Labs Unresulted Labs (From admission, onward)    Start     Ordered   08/22/19 [id number]  Basic metabolic panel  Daily,   R     08/21/19 1401   08/22/19 0500  Magnesium  Tomorrow morning,   R     08/21/19 1420   08/22/19 0500  Phosphorus  Tomorrow morning,   R     08/21/19 1420   08/21/19 0609  Pathologist smear review  Once,   R     08/21/19 0609          Vitals/Pain Today's Vitals   08/21/19 1345 08/21/19 1400 08/21/19 1626 08/21/19 1628  BP: 113/63 135/79 115/71   Pulse: 93 88 96   Resp: 20 15 17    Temp:      TempSrc:      SpO2: 94% 94% 95%   Weight:      Height:      PainSc:   0-No pain 0-No pain    Isolation Precautions No active isolations  Medications Medications  furosemide (LASIX) injection 40 mg (has no administration in time range)  QUEtiapine (SEROQUEL) tablet 12.5 mg (has no administration in time range)  tiZANidine (ZANAFLEX) tablet 4 mg (has no administration in time range)  allopurinol (ZYLOPRIM) tablet 150 mg (150 mg Oral Given 08/21/19 1623)  aspirin EC tablet 81 mg (81 mg Oral Given 08/21/19 1624)  metoprolol tartrate (LOPRESSOR) tablet 50 mg (50 mg Oral Not  Given 08/21/19 1500)  pravastatin (PRAVACHOL) tablet 20 mg (20 mg Oral Given 08/21/19 1622)  QUEtiapine (SEROQUEL) tablet 25 mg (has no administration in time range)  pantoprazole (PROTONIX) EC tablet 40 mg (40 mg Oral Given 08/21/19 1621)  apixaban (ELIQUIS) tablet 5 mg (5 mg Oral Given 08/21/19 1624)  mometasone-formoterol (DULERA) 200-5 MCG/ACT inhaler 2 puff (has no administration in time range)  albuterol (PROVENTIL) (2.5 MG/3ML) 0.083% nebulizer solution 3 mL (has no administration in time range)  diphenhydrAMINE (BENADRYL) capsule 25 mg (has no administration in time range)  sodium chloride (OCEAN) 0.65 % nasal spray 1 spray (has no administration in time range)  fluticasone (FLONASE) 50 MCG/ACT nasal spray 1 spray (1 spray Each Nare Not Given 08/21/19 1626)  sodium chloride flush (NS) 0.9 % injection 3 mL (3 mLs Intravenous Given 08/21/19 1626)  sodium chloride flush (NS) 0.9 % injection 3 mL (has no administration in time range)  0.9 %  sodium chloride infusion (has no administration in time  range)  acetaminophen (TYLENOL) tablet 650 mg (has no administration in time range)  ondansetron (ZOFRAN) injection 4 mg (has no administration in time range)    Mobility walks with device Moderate fall risk   Focused Assessments Cardiac Assessment Handoff:  Cardiac Rhythm: Normal sinus rhythm Lab Results  Component Value Date   CKTOTAL 32 (L) 08/21/2019   CKMB 3.7 02/03/2012   TROPONINI <0.30 02/03/2012   Lab Results  Component Value Date   DDIMER  01/24/2009    0.29        AT THE INHOUSE ESTABLISHED CUTOFF VALUE OF 0.48 ug/mL FEU, THIS ASSAY HAS BEEN DOCUMENTED IN THE LITERATURE TO HAVE A SENSITIVITY AND NEGATIVE PREDICTIVE VALUE OF AT LEAST 98 TO 99%.  THE TEST RESULT SHOULD BE CORRELATED WITH AN ASSESSMENT OF THE CLINICAL PROBABILITY OF DVT / VTE.   Does the Patient currently have chest pain? No     R Recommendations: See Admitting Provider Note  Report given to:    Additional Notes:   Acute on chronic diastolic CHF, fluid overload with crackles and 2+ edema.  Plan to aggressively diuresis and monitor kidney function.  Hold amlodipine for worsening of leg edema. Positive troponins - Related to decompensated CHF Chronic ambulatory dysfunction with acute decompensation, PT/OT evaluation. Chronic anemia, worked up in December 2020 for hematology evaluation, likely secondary to chronic myelo proliferative disorder, low reticulocyte count and normal iron study.  Stable. Paroxysmal A. Fib, controlled, continue Eliquis.. Morbid obesity, outpatient bariatric evaluation.

## 2019-08-21 NOTE — Progress Notes (Signed)
  Echocardiogram 2D Echocardiogram has been performed.  Licet Dunphy A Mattis Featherly 08/21/2019, 2:06 PM

## 2019-08-21 NOTE — Progress Notes (Signed)
Received message from [facility]; they have been following patient for DCP; CM updated; B Pennie Rushing Advanced Care Supervisor

## 2019-08-21 NOTE — ED Provider Notes (Addendum)
Patient has been seen after prior ED providers.  Patient reports that she feels comfortable at this time.  She specifically denies chest pain or current shortness of breath.  Lab work-up is notable for elevated initial troponin of 176.  EKG is without evidence of acute ST elevation or other acute ischemia.  Cardiology is aware of elevated troponin and will evaluate the patient in the ED.    I suspect that patient will require admission for further work-up and treatment.   Valarie Merino, MD 08/21/19 [id number]    Valarie Merino, MD 08/21/19 [id number]    Valarie Merino, MD 08/21/19 1258

## 2019-08-21 NOTE — Plan of Care (Signed)

## 2019-08-21 NOTE — ED Triage Notes (Signed)
Patient presents to ED via [facility] from home states she was discharged from [facility]. States she has been laying in her bed since d/c. Patient had a depends on and was incont of urine and stool. States her son lives with her however she came in today because she knew she would be alone after her son went to work and she couldn't do anything for herself. Patient is alert oriented.

## 2019-08-22 DIAGNOSIS — I350 Nonrheumatic aortic (valve) stenosis: Secondary | ICD-10-CM

## 2019-08-22 DIAGNOSIS — D471 Chronic myeloproliferative disease: Secondary | ICD-10-CM

## 2019-08-22 DIAGNOSIS — I10 Essential (primary) hypertension: Secondary | ICD-10-CM

## 2019-08-22 LAB — BASIC METABOLIC PANEL
Anion gap: 14 (ref 5–15)
BUN: 23 mg/dL (ref 8–23)
CO2: 28 mmol/L (ref 22–32)
Calcium: 9.3 mg/dL (ref 8.9–10.3)
Chloride: 94 mmol/L — ABNORMAL LOW (ref 98–111)
Creatinine, Ser: 1.13 mg/dL — ABNORMAL HIGH (ref 0.44–1.00)
GFR calc Af Amer: 55 mL/min — ABNORMAL LOW (ref >60)
GFR calc non Af Amer: 47 mL/min — ABNORMAL LOW (ref >60)
Glucose, Bld: 26 mg/dL — CL (ref 70–99)
Potassium: 4 mmol/L (ref 3.5–5.1)
Sodium: 136 mmol/L (ref 135–145)

## 2019-08-22 LAB — MAGNESIUM: Magnesium: 2 mg/dL (ref 1.7–2.4)

## 2019-08-22 LAB — PHOSPHORUS: Phosphorus: 6 mg/dL — ABNORMAL HIGH (ref 2.5–4.6)

## 2019-08-22 LAB — GLUCOSE, CAPILLARY: Glucose-Capillary: 77 mg/dL (ref 70–99)

## 2019-08-22 NOTE — Progress Notes (Signed)
Rehab Admissions Coordinator Note:  Patient was screened by Raechel Ache for appropriateness for an Inpatient Acute Rehab Consult. After chart review, it appears this patient has been having difficulty with mobility over an extended period of time. Given that pt does not have 24/7 A at DC, it is unlikely that the pt would get to a Mod I level after an IP Rehab stay. Also given current working diagnosis, it does not appear that this patient has the medical complexity to warrant an IP Rehab stay.  At this time, we are recommending [place].  AC will not request CIR consult for this patient.   Raechel Ache 08/22/2019, 9:53 AM  I can be reached at [phone].

## 2019-08-22 NOTE — Progress Notes (Signed)
PROGRESS NOTE    Erika [name]  [id number] DOB: 02-18-1941 DOA: 08/21/2019 PCP: [name], MD     Brief Narrative:  Erika [name] is a 79 y.o. female with medical history significant of asthma, degenerative disc disease, chronic back pain, history of psychosis, hypertension morbid obesity and SVTs, myeloproliferative disorderwho was discharged from rehab yesterday after 20 days of rehab. She returned back to the hospital with complaints of weakness as well as some peripheral edema.  New events last 24 hours / Subjective: States that her breathing has improved, continues to have some lower extremity edema.  Had a hypoglycemic episode overnight which was asymptomatic and improved this morning.  Assessment & Plan:   Principal Problem:   Acute on chronic diastolic CHF (congestive heart failure) (HCC) Active Problems:   Obesity, Class III, BMI 40-49.9 (morbid obesity) ([place])   Essential hypertension   Leukocytosis   Myeloproliferative disorder (HCC)   Aortic stenosis    Acute on chronic diastolic CHF -Cardiogram [id number] revealed EF 55 to 60% -Cardiology following -Continue IV Lasix -Daily weights and strict I's and O's -Continue Lopressor  Hyperlipidemia -Continue Pravachol  Elevated troponin -Demand ischemia, troponin 176-->50 -No complaints of chest pain  Chronic ambulatory dysfunction with acute decompensation -PT/OT recommending CIR, although patient may not be a good candidate for CIR per rehab coordinator.  SNF placement most likely needed  Myeloproliferative disorder -Followed by Dr. Irene Limbo  Paroxysmal A. Fib -Continue Eliquis  Morbid obesity -Estimated body mass index is 44.54 kg/m as calculated from the following:   Height as of this encounter: 5\' 7"  (1.702 m).   Weight as of this encounter: 129 kg.    DVT prophylaxis: Eliquis Code Status: Full code Family Communication: No family at bedside Disposition Plan: SNF placement pending when  clinically improved  Patient is noted to be an internal medicine teaching service patient.  Discussed with resident on-call.  They will pick up patient on their service 1/17 a.m.   Consultants:   Cardiology   Antimicrobials:  Anti-infectives (From admission, onward)   None        Objective: Vitals:   08/22/19 0450 08/22/19 0452 08/22/19 0729 08/22/19 0736  BP:  120/62  (!) 117/53  Pulse:  88  88  Resp:  20  18  Temp:  98.2 F (36.8 C)  98.3 F (36.8 C)  TempSrc:  Oral  Oral  SpO2:  92% 94% 92%  Weight: 129 kg     Height:        Intake/Output Summary (Last 24 hours) at 08/22/2019 1046 Last data filed at 08/22/2019 0908 Gross per 24 hour  Intake 840 ml  Output 3451 ml  Net -2611 ml   Filed Weights   08/21/19 0518 08/21/19 1842 08/22/19 0450  Weight: 127 kg 129 kg 129 kg    Examination:  General exam: Appears calm and comfortable  Respiratory system: Diminished breath sounds. Respiratory effort normal. No respiratory distress. No conversational dyspnea.  Cardiovascular system: S1 & S2 heard, RRR. No murmurs. + Nonpitting bilateral pedal edema. Gastrointestinal system: Abdomen is nondistended, soft and nontender. Normal bowel sounds heard. Central nervous system: Alert and oriented. No focal neurological deficits. Speech clear.  Extremities: Symmetric in appearance  Skin: No rashes, lesions or ulcers on exposed skin  Psychiatry: Judgement and insight appear normal. Mood & affect appropriate.   Data Reviewed: I have personally reviewed following labs and imaging studies  CBC: Recent Labs  Lab 08/21/19 0609  WBC 108.4*  NEUTROABS  93.1*  HGB 8.2*  HCT 25.9*  MCV 97.7  PLT [id number]   Basic Metabolic Panel: Recent Labs  Lab 08/21/19 0609 08/22/19 0513  NA 137 136  K 4.3 4.0  CL 101 94*  CO2 25 28  GLUCOSE 71 26*  BUN 24* 23  CREATININE 1.04* 1.13*  CALCIUM 9.4 9.3  MG  --  2.0  PHOS  --  6.0*   GFR: Estimated Creatinine Clearance: 59.2 mL/min (A)  (by C-G formula based on SCr of 1.13 mg/dL (H)). Liver Function Tests: No results for input(s): AST, ALT, ALKPHOS, BILITOT, PROT, ALBUMIN in the last 168 hours. No results for input(s): LIPASE, AMYLASE in the last 168 hours. No results for input(s): AMMONIA in the last 168 hours. Coagulation Profile: No results for input(s): INR, PROTIME in the last 168 hours. Cardiac Enzymes: Recent Labs  Lab 08/21/19 0609  CKTOTAL 32*   BNP (last 3 results) No results for input(s): PROBNP in the last 8760 hours. HbA1C: No results for input(s): HGBA1C in the last 72 hours. CBG: Recent Labs  Lab 08/21/19 0632 08/22/19 0703  GLUCAP 84 77   Lipid Profile: No results for input(s): CHOL, HDL, LDLCALC, TRIG, CHOLHDL, LDLDIRECT in the last 72 hours. Thyroid Function Tests: No results for input(s): TSH, T4TOTAL, FREET4, T3FREE, THYROIDAB in the last 72 hours. Anemia Panel: No results for input(s): VITAMINB12, FOLATE, FERRITIN, TIBC, IRON, RETICCTPCT in the last 72 hours. Sepsis Labs: No results for input(s): PROCALCITON, LATICACIDVEN in the last 168 hours.  Recent Results (from the past 240 hour(s))  Respiratory Panel by RT PCR (Flu A&B, Covid) - Nasopharyngeal Swab     Status: None   Collection Time: 08/21/19  8:08 AM   Specimen: Nasopharyngeal Swab  Result Value Ref Range Status   SARS Coronavirus 2 by RT PCR NEGATIVE NEGATIVE Final    Comment: (NOTE) SARS-CoV-2 target nucleic acids are NOT DETECTED. The SARS-CoV-2 RNA is generally detectable in upper respiratoy specimens during the acute phase of infection. The lowest concentration of SARS-CoV-2 viral copies this assay can detect is 131 copies/mL. A negative result does not preclude SARS-Cov-2 infection and should not be used as the sole basis for treatment or other patient management decisions. A negative result may occur with  improper specimen collection/handling, submission of specimen other than nasopharyngeal swab, presence of viral  mutation(s) within the areas targeted by this assay, and inadequate number of viral copies (<131 copies/mL). A negative result must be combined with clinical observations, patient history, and epidemiological information. The expected result is Negative. Fact Sheet for Patients:  PinkCheek.be Fact Sheet for Healthcare Providers:  GravelBags.it This test is not yet ap proved or cleared by the [place] FDA and  has been authorized for detection and/or diagnosis of SARS-CoV-2 by FDA under an Emergency Use Authorization (EUA). This EUA will remain  in effect (meaning this test can be used) for the duration of the COVID-19 declaration under Section 564(b)(1) of the Act, 21 U.S.C. section 360bbb-3(b)(1), unless the authorization is terminated or revoked sooner.    Influenza A by PCR NEGATIVE NEGATIVE Final   Influenza B by PCR NEGATIVE NEGATIVE Final    Comment: (NOTE) The Xpert Xpress SARS-CoV-2/FLU/RSV assay is intended as an aid in  the diagnosis of influenza from Nasopharyngeal swab specimens and  should not be used as a sole basis for treatment. Nasal washings and  aspirates are unacceptable for Xpert Xpress SARS-CoV-2/FLU/RSV  testing. Fact Sheet for Patients: PinkCheek.be Fact Sheet for Healthcare Providers:  GravelBags.it This test is not yet approved or cleared by the [place] and  has been authorized for detection and/or diagnosis of SARS-CoV-2 by  FDA under an Emergency Use Authorization (EUA). This EUA will remain  in effect (meaning this test can be used) for the duration of the  Covid-19 declaration under Section 564(b)(1) of the Act, 21  U.S.C. section 360bbb-3(b)(1), unless the authorization is  terminated or revoked. Performed at [facility], [place] [address] [place],  [address]       Radiology Studies: DG Chest Port 1  View  Result Date: 08/21/2019 CLINICAL DATA:  Shortness of breath EXAM: PORTABLE CHEST 1 VIEW COMPARISON:  07/23/2019 FINDINGS: Cardiomegaly and generalized interstitial coarsening which appears similar to before. No discrete Kerley lines. Pulmonary arteries are enlarged at the hila. No effusion or pneumothorax. IMPRESSION: Cardiomegaly and vascular congestion. Chronic pulmonary artery enlargement suggesting pulmonary hypertension. Electronically Signed   By: Monte Fantasia M.D.   On: 08/21/2019 05:18   ECHOCARDIOGRAM COMPLETE  Result Date: 08/21/2019   ECHOCARDIOGRAM REPORT   Patient Name:   ATARI EDLEY [facility] Date of Exam: 08/21/2019 Medical Rec #:  [id number]       Height:       68.0 in Accession #:    [id number]      Weight:       280.0 lb Date of Birth:  [birth year]        BSA:          2.36 m Patient Age:    14 years        BP:           113/63 mmHg Patient Gender: F               HR:           96 bpm. Exam Location:  Inpatient Procedure: 2D Echo Indications:    Elevated Troponin  History:        Patient has prior history of Echocardiogram examinations, most                 recent 06/02/2017. COPD, Arrythmias:Atrial Fibrillation; Risk                 Factors:Hypertension and Dyslipidemia.  Sonographer:    Vikki Ports Turrentine Referring Phys: [id number] Hilda  1. Left ventricular ejection fraction, by visual estimation, is 55 to 60%. The left ventricle has normal function. There is mildly increased left ventricular hypertrophy.  2. The aortic valve is abnormal. Moderate calcifications. Aortic valve regurgitation is not visualized. Moderate aortic valve stenosis (Vmax 3.0 m/s, MG 22 mmHg, AVA 1.4 cm^2, DI 0.45)  3. Global right ventricle has normal systolic function.The right ventricular size is mildly enlarged.  4. Left atrial size was moderately dilated.  5. Right atrial size was mildly dilated.  6. The mitral valve is normal in structure. Trivial mitral valve regurgitation.  7. The  tricuspid valve is normal in structure.  8. The pulmonic valve was not well visualized. Pulmonic valve regurgitation is not visualized.  9. The tricuspid regurgitant velocity is 3.28 m/s, and with an assumed right atrial pressure of 3 mmHg, the estimated right ventricular systolic pressure is moderately elevated at 46.0 mmHg. 10. The inferior vena cava is normal in size with greater than 50% respiratory variability, suggesting right atrial pressure of 3 mmHg. FINDINGS  Left Ventricle: Left ventricular ejection fraction, by visual estimation, is 55 to 60%. The left ventricle has normal function.  The left ventricle has no regional wall motion abnormalities. There is mildly increased left ventricular hypertrophy. Left ventricular diastolic parameters were normal. Right Ventricle: The right ventricular size is mildly enlarged. No increase in right ventricular wall thickness. Global RV systolic function is has normal systolic function. The tricuspid regurgitant velocity is 3.28 m/s, and with an assumed right atrial  pressure of 3 mmHg, the estimated right ventricular systolic pressure is moderately elevated at 46.0 mmHg. Left Atrium: Left atrial size was moderately dilated. Right Atrium: Right atrial size was mildly dilated Pericardium: There is no evidence of pericardial effusion. Mitral Valve: The mitral valve is normal in structure. Trivial mitral valve regurgitation. Tricuspid Valve: The tricuspid valve is normal in structure. Tricuspid valve regurgitation moderate. Aortic Valve: The aortic valve is abnormal. Aortic valve regurgitation is not visualized. Moderate aortic stenosis is present. There is moderate calcification of the aortic valve. Aortic valve mean gradient measures 18.2 mmHg. Aortic valve peak gradient measures 32.3 mmHg. Aortic valve area, by VTI measures 1.52 cm. Pulmonic Valve: The pulmonic valve was not well visualized. Pulmonic valve regurgitation is not visualized. Pulmonic regurgitation is not  visualized. Aorta: The aortic root is normal in size and structure. Venous: The inferior vena cava is normal in size with greater than 50% respiratory variability, suggesting right atrial pressure of 3 mmHg. IAS/Shunts: The interatrial septum was not well visualized.  LEFT VENTRICLE PLAX 2D LVIDd:         4.71 cm  Diastology LVIDs:         3.29 cm  LV e' lateral:   10.80 cm/s LV PW:         0.89 cm  LV E/e' lateral: 11.2 LV IVS:        0.86 cm  LV e' medial:    9.14 cm/s LVOT diam:     2.00 cm  LV E/e' medial:  13.2 LV SV:         59 ml LV SV Index:   23.34 LVOT Area:     3.14 cm  RIGHT VENTRICLE RV S prime:     19.70 cm/s TAPSE (M-mode): 3.2 cm LEFT ATRIUM              Index       RIGHT ATRIUM           Index LA diam:        4.60 cm  1.95 cm/m  RA Area:     25.00 cm LA Vol (A2C):   88.7 ml  37.62 ml/m RA Volume:   80.10 ml  33.97 ml/m LA Vol (A4C):   113.0 ml 47.92 ml/m LA Biplane Vol: 105.0 ml 44.53 ml/m  AORTIC VALVE AV Area (Vmax):    1.56 cm AV Area (Vmean):   1.61 cm AV Area (VTI):     1.52 cm AV Vmax:           284.20 cm/s AV Vmean:          197.600 cm/s AV VTI:            0.582 m AV Peak Grad:      32.3 mmHg AV Mean Grad:      18.2 mmHg LVOT Vmax:         141.00 cm/s LVOT Vmean:        101.000 cm/s LVOT VTI:          0.281 m LVOT/AV VTI ratio: 0.48  AORTA Ao Root diam: 2.50 cm MITRAL VALVE  TRICUSPID VALVE MV Area (PHT): 3.99 cm              TR Peak grad:   43.0 mmHg MV PHT:        55.10 msec            TR Vmax:        328.00 cm/s MV Decel Time: 190 msec MV E velocity: 121.00 cm/s 103 cm/s  SHUNTS MV A velocity: 127.00 cm/s 70.3 cm/s Systemic VTI:  0.28 m MV E/A ratio:  0.95        1.5       Systemic Diam: 2.00 cm  Oswaldo Milian MD Electronically signed by Oswaldo Milian MD Signature Date/Time: 08/21/2019/8:37:08 PM    Final       Scheduled Meds: . allopurinol  150 mg Oral BID  . apixaban  5 mg Oral BID  . aspirin EC  81 mg Oral Daily  . fluticasone  1  spray Each Nare Daily  . furosemide  40 mg Intravenous BID  . metoprolol tartrate  50 mg Oral BID  . mometasone-formoterol  2 puff Inhalation BID  . pantoprazole  40 mg Oral Daily  . pravastatin  20 mg Oral Daily  . QUEtiapine  25 mg Oral QHS  . sodium chloride flush  3 mL Intravenous Q12H   Continuous Infusions: . sodium chloride       LOS: 1 day      Time spent: 40 minutes   Dessa Phi, DO [facility] 08/22/2019, 10:46 AM   Available via Epic secure chat 7am-7pm After these hours, please refer to coverage provider listed on [email or web address]

## 2019-08-22 NOTE — Progress Notes (Signed)
Lab called with glucose result of 26. Pt is AOx4. Skin warm and dry to touch. CBG 77. Will notify MD on call.

## 2019-08-22 NOTE — Progress Notes (Signed)
DAILY PROGRESS NOTE   Patient Name: Erika [name] Date of Encounter: 08/22/2019 Cardiologist: No primary care provider on file.  Chief Complaint   Feels better  Patient Profile   Erika [name] is a 79 y.o. female with hx of HTN, remote SVT, paroxysmal atrial flutter on Eliquis, myeloproliferative neoplasm NOS (Followed by Dr. Irene Limbo), obesity, chronic neurophils, chronic back pain s/p laminectomy and  DDD presents for evaluation of weakness and  is being seen today for the evaluation of CHF and elevated troponin  at the request of Dr. Francia Greaves.   Subjective   Diuresed -2.2 L overnight - creatinine up to 1.13 form 1.04. BNP was 873. Echo shows LVEF 55-60%, moderate aortic stenosis (mean gradient 22 mmHg). Weight appears stable.  Objective   Vitals:   08/22/19 0450 08/22/19 0452 08/22/19 0729 08/22/19 0736  BP:  120/62  (!) 117/53  Pulse:  88  88  Resp:  20  18  Temp:  98.2 F (36.8 C)  98.3 F (36.8 C)  TempSrc:  Oral  Oral  SpO2:  92% 94% 92%  Weight: 129 kg     Height:        Intake/Output Summary (Last 24 hours) at 08/22/2019 0932 Last data filed at 08/22/2019 0908 Gross per 24 hour  Intake 840 ml  Output 2850 ml  Net -2010 ml   Filed Weights   08/21/19 0518 08/21/19 1842 08/22/19 0450  Weight: 127 kg 129 kg 129 kg    Physical Exam   General appearance: alert, no distress and moderately obese Neck: JVD - 2 cm above sternal notch, no carotid bruit and thyroid not enlarged, symmetric, no tenderness/mass/nodules Lungs: diminished breath sounds bibasilar Heart: regular rate and rhythm, S1, S2 normal and systolic murmur: systolic ejection 3/6, crescendo at 2nd right intercostal space Abdomen: soft, non-tender; bowel sounds normal; no masses,  no organomegaly and obese Extremities: edema trace bilateral Pulses: 2+ and symmetric Skin: Skin color, texture, turgor normal. No rashes or lesions Neurologic: Grossly normal Psych: Pleasant  Inpatient Medications      Scheduled Meds: . allopurinol  150 mg Oral BID  . apixaban  5 mg Oral BID  . aspirin EC  81 mg Oral Daily  . fluticasone  1 spray Each Nare Daily  . furosemide  40 mg Intravenous BID  . metoprolol tartrate  50 mg Oral BID  . mometasone-formoterol  2 puff Inhalation BID  . pantoprazole  40 mg Oral Daily  . pravastatin  20 mg Oral Daily  . QUEtiapine  25 mg Oral QHS  . sodium chloride flush  3 mL Intravenous Q12H    Continuous Infusions: . sodium chloride      PRN Meds: sodium chloride, acetaminophen, albuterol, diphenhydrAMINE, guaiFENesin-dextromethorphan, ondansetron (ZOFRAN) IV, QUEtiapine, sodium chloride, sodium chloride flush, tiZANidine   Labs   Results for orders placed or performed during the hospital encounter of 08/21/19 (from the past 48 hour(s))  CBC with Differential     Status: Abnormal   Collection Time: 08/21/19  6:09 AM  Result Value Ref Range   WBC 108.4 (HH) 4.0 - 10.5 K/uL    Comment: REPEATED TO VERIFY WHITE COUNT CONFIRMED ON SMEAR THIS CRITICAL RESULT HAS VERIFIED AND BEEN CALLED TO N. COHUT, RN BY JULIE MACEDA DEL ANGEL ON 01 15 2021 AT 0732, AND HAS BEEN READ BACK.  THIS CRITICAL RESULT HAS VERIFIED AND BEEN CALLED TO N. KOHUT, RN BY JULIE MACEDA DEL ANGEL ON 01 15 2021 AT 0732, AND  HAS BEEN READ BACK.     RBC 2.65 (L) 3.87 - 5.11 MIL/uL   Hemoglobin 8.2 (L) 12.0 - 15.0 g/dL   HCT 25.9 (L) 36.0 - 46.0 %   MCV 97.7 80.0 - 100.0 fL   MCH 30.9 26.0 - 34.0 pg   MCHC 31.7 30.0 - 36.0 g/dL   RDW 17.7 (H) 11.5 - 15.5 %   Platelets 177 150 - 400 K/uL   nRBC 0.0 0.0 - 0.2 %   Neutrophils Relative % 86 %   Neutro Abs 93.1 (H) 1.7 - 7.7 K/uL   Lymphocytes Relative 3 %   Lymphs Abs 3.0 0.7 - 4.0 K/uL   Monocytes Relative 3 %   Monocytes Absolute 3.0 (H) 0.1 - 1.0 K/uL   Eosinophils Relative 0 %   Eosinophils Absolute 0.4 0.0 - 0.5 K/uL   Basophils Relative 0 %   Basophils Absolute 0.2 (H) 0.0 - 0.1 K/uL   WBC Morphology DOHLE BODIES     Comment:  MODERATE LEFT SHIFT (>5% METAS AND MYELOS,OCC PRO NOTED) TOXIC GRANULATION    Immature Granulocytes 8 %   Abs Immature Granulocytes 8.94 (H) 0.00 - 0.07 K/uL    Comment: Performed at [facility], [address], [place], [place] [id number]  Basic metabolic panel     Status: Abnormal   Collection Time: 08/21/19  6:09 AM  Result Value Ref Range   Sodium 137 135 - 145 mmol/L   Potassium 4.3 3.5 - 5.1 mmol/L   Chloride 101 98 - 111 mmol/L   CO2 25 22 - 32 mmol/L   Glucose, Bld 71 70 - 99 mg/dL   BUN 24 (H) 8 - 23 mg/dL   Creatinine, Ser 1.04 (H) 0.44 - 1.00 mg/dL   Calcium 9.4 8.9 - 10.3 mg/dL   GFR calc non Af Amer 52 (L) >60 mL/min   GFR calc Af Amer >60 >60 mL/min   Anion gap 11 5 - 15    Comment: Performed at [facility], [place] [address], [place], [place] [address]  Brain natriuretic peptide     Status: Abnormal   Collection Time: 08/21/19  6:09 AM  Result Value Ref Range   B Natriuretic Peptide 873.6 (H) 0.0 - 100.0 pg/mL    Comment: Performed at [facility] [address] [place], [place] [address]  Troponin I (High Sensitivity)     Status: Abnormal   Collection Time: 08/21/19  6:09 AM  Result Value Ref Range   Troponin I (High Sensitivity) 176 (HH) <18 ng/L    Comment: CRITICAL RESULT CALLED TO, READ BACK BY AND VERIFIED WITHRhona Leavens RN [phone] BY A BENNETT (NOTE) Elevated high sensitivity troponin I (hsTnI) values and significant  changes across serial measurements may suggest ACS but many other  chronic and acute conditions are known to elevate hsTnI results.  Refer to the Links section for chest pain algorithms and additional  guidance. Performed at [facility], [place] [address] [place], [place] [address]   CK     Status: Abnormal   Collection Time: 08/21/19  6:09 AM  Result Value Ref Range   Total CK 32 (L) 38 - 234 U/L    Comment: Performed at [facility], [place] [address] [place], [place] [address]  POC CBG, ED     Status:  None   Collection Time: 08/21/19  6:32 AM  Result Value Ref Range   Glucose-Capillary 84 70 -  99 mg/dL  Troponin I (High Sensitivity)     Status: Abnormal   Collection Time: 08/21/19  8:08 AM  Result Value Ref Range   Troponin I (High Sensitivity) 50 (H) <18 ng/L    Comment: DELTA CHECK NOTED (NOTE) Elevated high sensitivity troponin I (hsTnI) values and significant  changes across serial measurements may suggest ACS but many other  chronic and acute conditions are known to elevate hsTnI results.  Refer to the Links section for chest pain algorithms and additional  guidance. Performed at [facility], [place] [address], [place], [place] [address]   Respiratory Panel by RT PCR (Flu A&B, Covid) - Nasopharyngeal Swab     Status: None   Collection Time: 08/21/19  8:08 AM   Specimen: Nasopharyngeal Swab  Result Value Ref Range   SARS Coronavirus 2 by RT PCR NEGATIVE NEGATIVE    Comment: (NOTE) SARS-CoV-2 target nucleic acids are NOT DETECTED. The SARS-CoV-2 RNA is generally detectable in upper respiratoy specimens during the acute phase of infection. The lowest concentration of SARS-CoV-2 viral copies this assay can detect is 131 copies/mL. A negative result does not preclude SARS-Cov-2 infection and should not be used as the sole basis for treatment or other patient management decisions. A negative result may occur with  improper specimen collection/handling, submission of specimen other than nasopharyngeal swab, presence of viral mutation(s) within the areas targeted by this assay, and inadequate number of viral copies (<131 copies/mL). A negative result must be combined with clinical observations, patient history, and epidemiological information. The expected result is Negative. Fact Sheet for Patients:  PinkCheek.be Fact Sheet for Healthcare Providers:  GravelBags.it This test is not yet ap proved or cleared by the  [place] FDA and  has been authorized for detection and/or diagnosis of SARS-CoV-2 by FDA under an Emergency Use Authorization (EUA). This EUA will remain  in effect (meaning this test can be used) for the duration of the COVID-19 declaration under Section 564(b)(1) of the Act, 21 U.S.C. section 360bbb-3(b)(1), unless the authorization is terminated or revoked sooner.    Influenza A by PCR NEGATIVE NEGATIVE   Influenza B by PCR NEGATIVE NEGATIVE    Comment: (NOTE) The Xpert Xpress SARS-CoV-2/FLU/RSV assay is intended as an aid in  the diagnosis of influenza from Nasopharyngeal swab specimens and  should not be used as a sole basis for treatment. Nasal washings and  aspirates are unacceptable for Xpert Xpress SARS-CoV-2/FLU/RSV  testing. Fact Sheet for Patients: PinkCheek.be Fact Sheet for Healthcare Providers: GravelBags.it This test is not yet approved or cleared by the [place] FDA and  has been authorized for detection and/or diagnosis of SARS-CoV-2 by  FDA under an Emergency Use Authorization (EUA). This EUA will remain  in effect (meaning this test can be used) for the duration of the  Covid-19 declaration under Section 564(b)(1) of the Act, 21  U.S.C. section 360bbb-3(b)(1), unless the authorization is  terminated or revoked. Performed at [facility], [place] [address], [place], [place] [id number]   Basic metabolic panel     Status: Abnormal   Collection Time: 08/22/19  5:13 AM  Result Value Ref Range   Sodium 136 135 - 145 mmol/L   Potassium 4.0 3.5 - 5.1 mmol/L   Chloride 94 (L) 98 - 111 mmol/L   CO2 28 22 - 32 mmol/L   Glucose, Bld 26 (LL) 70 - 99 mg/dL    Comment: CRITICAL RESULT CALLED TO, READ BACK BY AND VERIFIED WITH:  E.[facility] RN @ 0700 08/22/2019 BY C.EDENS    BUN 23 8 - 23 mg/dL   Creatinine, Ser 1.13 (H) 0.44 - 1.00 mg/dL   Calcium 9.3 8.9 - 10.3 mg/dL   GFR calc non Af Amer 47 (L) >60  mL/min   GFR calc Af Amer 55 (L) >60 mL/min   Anion gap 14 5 - 15    Comment: Performed at [facility] [address] [place], [place] [address]  Magnesium     Status: None   Collection Time: 08/22/19  5:13 AM  Result Value Ref Range   Magnesium 2.0 1.7 - 2.4 mg/dL    Comment: Performed at [facility] [address] [place], [place] [address]  Phosphorus     Status: Abnormal   Collection Time: 08/22/19  5:13 AM  Result Value Ref Range   Phosphorus 6.0 (H) 2.5 - 4.6 mg/dL    Comment: Performed at [facility] [address], [place], [place] [address]  Glucose, capillary     Status: None   Collection Time: 08/22/19  7:03 AM  Result Value Ref Range   Glucose-Capillary 77 70 - 99 mg/dL    ECG   Sinus with PAC's (1/15) - Personally Reviewed  Telemetry   Sinus rhythm - Personally Reviewed  Radiology    DG Chest Port 1 View  Result Date: 08/21/2019 CLINICAL DATA:  Shortness of breath EXAM: PORTABLE CHEST 1 VIEW COMPARISON:  07/23/2019 FINDINGS: Cardiomegaly and generalized interstitial coarsening which appears similar to before. No discrete Kerley lines. Pulmonary arteries are enlarged at the hila. No effusion or pneumothorax. IMPRESSION: Cardiomegaly and vascular congestion. Chronic pulmonary artery enlargement suggesting pulmonary hypertension. Electronically Signed   By: Monte Fantasia M.D.   On: 08/21/2019 05:18   ECHOCARDIOGRAM COMPLETE  Result Date: 08/21/2019   ECHOCARDIOGRAM REPORT   Patient Name:   Erika [name] [facility] Date of Exam: 08/21/2019 Medical Rec #:  [id number]       Height:       68.0 in Accession #:    [id number]      Weight:       280.0 lb Date of Birth:  Apr 26, 1941        BSA:          2.36 m Patient Age:    39 years        BP:           113/63 mmHg Patient Gender: F               HR:           96 bpm. Exam Location:  Inpatient Procedure: 2D Echo Indications:    Elevated Troponin  History:        Patient has prior history of Echocardiogram  examinations, most                 recent 06/02/2017. COPD, Arrythmias:Atrial Fibrillation; Risk                 Factors:Hypertension and Dyslipidemia.  Sonographer:    Vikki Ports Turrentine Referring Phys: [id number] [place]  1. Left ventricular ejection fraction, by visual estimation, is 55 to 60%. The left ventricle has normal function. There is mildly increased left ventricular hypertrophy.  2. The aortic valve is abnormal. Moderate calcifications. Aortic valve regurgitation is not visualized. Moderate aortic valve stenosis (Vmax 3.0 m/s, MG 22 mmHg, AVA 1.4 cm^2, DI 0.45)  3. Global right ventricle has normal  systolic function.The right ventricular size is mildly enlarged.  4. Left atrial size was moderately dilated.  5. Right atrial size was mildly dilated.  6. The mitral valve is normal in structure. Trivial mitral valve regurgitation.  7. The tricuspid valve is normal in structure.  8. The pulmonic valve was not well visualized. Pulmonic valve regurgitation is not visualized.  9. The tricuspid regurgitant velocity is 3.28 m/s, and with an assumed right atrial pressure of 3 mmHg, the estimated right ventricular systolic pressure is moderately elevated at 46.0 mmHg. 10. The inferior vena cava is normal in size with greater than 50% respiratory variability, suggesting right atrial pressure of 3 mmHg. FINDINGS  Left Ventricle: Left ventricular ejection fraction, by visual estimation, is 55 to 60%. The left ventricle has normal function. The left ventricle has no regional wall motion abnormalities. There is mildly increased left ventricular hypertrophy. Left ventricular diastolic parameters were normal. Right Ventricle: The right ventricular size is mildly enlarged. No increase in right ventricular wall thickness. Global RV systolic function is has normal systolic function. The tricuspid regurgitant velocity is 3.28 m/s, and with an assumed right atrial  pressure of 3 mmHg, the estimated right  ventricular systolic pressure is moderately elevated at 46.0 mmHg. Left Atrium: Left atrial size was moderately dilated. Right Atrium: Right atrial size was mildly dilated Pericardium: There is no evidence of pericardial effusion. Mitral Valve: The mitral valve is normal in structure. Trivial mitral valve regurgitation. Tricuspid Valve: The tricuspid valve is normal in structure. Tricuspid valve regurgitation moderate. Aortic Valve: The aortic valve is abnormal. Aortic valve regurgitation is not visualized. Moderate aortic stenosis is present. There is moderate calcification of the aortic valve. Aortic valve mean gradient measures 18.2 mmHg. Aortic valve peak gradient measures 32.3 mmHg. Aortic valve area, by VTI measures 1.52 cm. Pulmonic Valve: The pulmonic valve was not well visualized. Pulmonic valve regurgitation is not visualized. Pulmonic regurgitation is not visualized. Aorta: The aortic root is normal in size and structure. Venous: The inferior vena cava is normal in size with greater than 50% respiratory variability, suggesting right atrial pressure of 3 mmHg. IAS/Shunts: The interatrial septum was not well visualized.  LEFT VENTRICLE PLAX 2D LVIDd:         4.71 cm  Diastology LVIDs:         3.29 cm  LV e' lateral:   10.80 cm/s LV PW:         0.89 cm  LV E/e' lateral: 11.2 LV IVS:        0.86 cm  LV e' medial:    9.14 cm/s LVOT diam:     2.00 cm  LV E/e' medial:  13.2 LV SV:         59 ml LV SV Index:   23.34 LVOT Area:     3.14 cm  RIGHT VENTRICLE RV S prime:     19.70 cm/s TAPSE (M-mode): 3.2 cm LEFT ATRIUM              Index       RIGHT ATRIUM           Index LA diam:        4.60 cm  1.95 cm/m  RA Area:     25.00 cm LA Vol (A2C):   88.7 ml  37.62 ml/m RA Volume:   80.10 ml  33.97 ml/m LA Vol (A4C):   113.0 ml 47.92 ml/m LA Biplane Vol: 105.0 ml 44.53 ml/m  AORTIC VALVE AV Area (Vmax):  1.56 cm AV Area (Vmean):   1.61 cm AV Area (VTI):     1.52 cm AV Vmax:           284.20 cm/s AV Vmean:           197.600 cm/s AV VTI:            0.582 m AV Peak Grad:      32.3 mmHg AV Mean Grad:      18.2 mmHg LVOT Vmax:         141.00 cm/s LVOT Vmean:        101.000 cm/s LVOT VTI:          0.281 m LVOT/AV VTI ratio: 0.48  AORTA Ao Root diam: 2.50 cm MITRAL VALVE                         TRICUSPID VALVE MV Area (PHT): 3.99 cm              TR Peak grad:   43.0 mmHg MV PHT:        55.10 msec            TR Vmax:        328.00 cm/s MV Decel Time: 190 msec MV E velocity: 121.00 cm/s 103 cm/s  SHUNTS MV A velocity: 127.00 cm/s 70.3 cm/s Systemic VTI:  0.28 m MV E/A ratio:  0.95        1.5       Systemic Diam: 2.00 cm  Oswaldo Milian MD Electronically signed by Oswaldo Milian MD Signature Date/Time: 08/21/2019/8:37:08 PM    Final     Cardiac Studies   See echo above  Assessment   Principal Problem:   Acute on chronic diastolic CHF (congestive heart failure) (HCC) Active Problems:   Obesity, Class III, BMI 40-49.9 (morbid obesity) (HCC)   Essential hypertension   Leukocytosis   Myeloproliferative disorder (HCC)   Aortic stenosis   Plan   1. Ms. Beehler reports her breathing is better today - echo shows normal LVEF with moderate aortic stenosis - will need to follow-up on this as an outpatient. Would continue diuresis today- monitor creatinine.  Time Spent Directly with Patient:  I have spent a total of 25 minutes with the patient reviewing hospital notes, telemetry, EKGs, labs and examining the patient as well as establishing an assessment and plan that was discussed personally with the patient.  > 50% of time was spent in direct patient care.  Length of Stay:  LOS: 1 day   Pixie Casino, MD, [facility], [place] Director of the [facility] Lip[facility]  Card[facility]ovascular [facility] Reduction [facility] Diplomate of the American Board of Clinical Lipidology Attending Cardiologist  Direct Dial: [phone]  Fax: [phone]  Website:   [email or web address] 08/22/2019, 9:32 AM

## 2019-08-22 NOTE — Progress Notes (Signed)
IMTS will resume care for Erika [name] on morning of 08/23/19 at 7 am.    Delice Bison, DO IMTS, PGY-2 08/22/19, 12:16 PM

## 2019-08-22 NOTE — Plan of Care (Signed)
  Problem: Nutrition: Goal: Adequate nutrition will be maintained Outcome: Completed/Met   Problem: Coping: Goal: Level of anxiety will decrease Outcome: Completed/Met   Problem: Elimination: Goal: Will not experience complications related to bowel motility Outcome: Completed/Met Goal: Will not experience complications related to urinary retention Outcome: Completed/Met   Problem: Pain Managment: Goal: General experience of comfort will improve Outcome: Completed/Met   Problem: Safety: Goal: Ability to remain free from injury will improve Outcome: Completed/Met   Problem: Skin Integrity: Goal: Risk for impaired skin integrity will decrease Outcome: Completed/Met

## 2019-08-22 NOTE — Progress Notes (Signed)
CSW acknowledges consult for SNF. OT/PT is recommending CIR at this time. Please re-consult if disposition plan changes. Consult is being closed out. 

## 2019-08-23 ENCOUNTER — Other Ambulatory Visit: Payer: Self-pay

## 2019-08-23 LAB — BASIC METABOLIC PANEL
Anion gap: 10 (ref 5–15)
BUN: 26 mg/dL — ABNORMAL HIGH (ref 8–23)
CO2: 31 mmol/L (ref 22–32)
Calcium: 8.9 mg/dL (ref 8.9–10.3)
Chloride: 94 mmol/L — ABNORMAL LOW (ref 98–111)
Creatinine, Ser: 1.24 mg/dL — ABNORMAL HIGH (ref 0.44–1.00)
GFR calc Af Amer: 49 mL/min — ABNORMAL LOW (ref >60)
GFR calc non Af Amer: 42 mL/min — ABNORMAL LOW (ref >60)
Glucose, Bld: 77 mg/dL (ref 70–99)
Potassium: 3.8 mmol/L (ref 3.5–5.1)
Sodium: 135 mmol/L (ref 135–145)

## 2019-08-23 LAB — CBC
HCT: 24.9 % — ABNORMAL LOW (ref 36.0–46.0)
Hemoglobin: 8 g/dL — ABNORMAL LOW (ref 12.0–15.0)
MCH: 31 pg (ref 26.0–34.0)
MCHC: 32.1 g/dL (ref 30.0–36.0)
MCV: 96.5 fL (ref 80.0–100.0)
Platelets: 193 10*3/uL (ref 150–400)
RBC: 2.58 MIL/uL — ABNORMAL LOW (ref 3.87–5.11)
RDW: 17.8 % — ABNORMAL HIGH (ref 11.5–15.5)
WBC: 99.7 10*3/uL (ref 4.0–10.5)
nRBC: 0 % (ref 0.0–0.2)

## 2019-08-23 NOTE — Progress Notes (Signed)
Patient resting comfortably during shift report. Denies complaints.  

## 2019-08-23 NOTE — Progress Notes (Signed)
Patient rate is remaining WNL at this time, she appears to be back in SR.   Pt denies complaints, reports zero symptoms during period of atrial arrhythmia.

## 2019-08-23 NOTE — Progress Notes (Signed)
Occupational Therapy Treatment Patient Details Name: Erika [name] MRN: [id number] DOB: March 31, 1941 Today's Date: 08/23/2019    History of present illness Erika [name] is a 79 y.o. female with hx of HTN, remote SVT, paroxysmal atrial flutter on Eliquis, myeloproliferative neoplasm NOS (Followed by Dr. Irene Limbo), obesity, chronic neurophils, chronic back pain s/p laminectomy and  DDD presents weakness, inability to care for self, for the evaluation of CHF and elevated troponin. Discharged home one day from SNF.   OT comments  Session to place pt back to bed. Pt's sheets removed and changed by OTR. Pt requiring maxA + stedy for transfer back to bed. Pt requiring heaxy maxA +1 with use of bed pan to return to standing. Pt modA for BLE management from EOB to supine. Pt in trendelenberg for scooting up towards [place] with [place].  Pt's needs in reach and RN in room upon leaving. Pt would benefit from continued OT skilled services. OT following acutely.    Follow Up Recommendations  SNF;Supervision/Assistance - 24 hour(if pt refuses SNF, will need equipment for home)    Equipment Recommendations  3 in 1 bedside commode;Other (comment)(bari 3in1; hoyer lift with pads if pt chooses d/c home)    Recommendations for Other Services      Precautions / Restrictions Precautions Precautions: Fall Restrictions Weight Bearing Restrictions: No       Mobility Bed Mobility Overal bed mobility: Needs Assistance Bed Mobility: Sit to Supine     Supine to sit: Min assist;Mod assist Sit to supine: Mod assist   General bed mobility comments: Mod A for BLE management  Transfers Overall transfer level: Needs assistance   Transfers: Sit to/from Stand Sit to Stand: Max assist;From elevated surface(+1 heavy maxA)              Balance Overall balance assessment: Needs assistance Sitting-balance support: Feet unsupported Sitting balance-Leahy Scale: Good     Standing balance support:  Bilateral upper extremity supported Standing balance-Leahy Scale: Poor Standing balance comment: pt reliant on external support; holding onto stedy                           ADL either performed or assessed with clinical judgement   ADL Overall ADL's : Needs assistance/impaired Eating/Feeding: Set up;Sitting   Grooming: Set up;Sitting   Upper Body Bathing: Minimal assistance;Sitting Upper Body Bathing Details (indicate cue type and reason): minA for washing bacl; pt set-upA for front Lower Body Bathing: Moderate assistance;Maximal assistance;Sitting/lateral leans;Sit to/from stand Lower Body Bathing Details (indicate cue type and reason): using STEDY to stand upright for peri care Upper Body Dressing : Set up;Sitting   Lower Body Dressing: Maximal assistance;Total assistance;Sitting/lateral leans;Sit to/from stand Lower Body Dressing Details (indicate cue type and reason): not enough space between BLEs to donn underwear in standing Toilet Transfer: Maximal assistance;BSC Toilet Transfer Details (indicate cue type and reason): STEDY heavy maxA using bed pad to stand from elevated position Toileting- Clothing Manipulation and Hygiene: Maximal assistance;Sitting/lateral lean;Sit to/from stand Toileting - Clothing Manipulation Details (indicate cue type and reason): maxA for pericare anterior and posterior     Functional mobility during ADLs: Maximal assistance;+2 for physical assistance;+2 for safety/equipment General ADL Comments: Pt limited by pain in B/L knees, decreased strength, decreased mobility and decreased acitivty tolerance for functional tasks.     Vision Baseline Vision/History: No visual deficits Vision Assessment?: No apparent visual deficits   Perception     Praxis  Cognition Arousal/Alertness: Awake/alert Behavior During Therapy: WFL for tasks assessed/performed Overall Cognitive Status: Within Functional Limits for tasks assessed                                           Exercises     Shoulder Instructions       General Comments VSS on RA.    Pertinent Vitals/ Pain       Pain Assessment: Faces Faces Pain Scale: Hurts little more Pain Location: bilateral knees with movement Pain Descriptors / Indicators: Discomfort;Grimacing Pain Intervention(s): Limited activity within patient's tolerance;Monitored during session  Home Living Family/patient expects to be discharged to:: Private residence Living Arrangements: Children Available Help at Discharge: Family;Available PRN/intermittently Type of Home: Apartment Home Access: Level entry     Home Layout: One level     Bathroom Shower/Tub: Occupational psychologist: Standard     Home Equipment: Environmental consultant - 4 wheels;Other (comment);Hospital bed;Wheelchair - manual   Additional Comments: lives with one son, second son comes by to help out, adult grandaughter helps with housecleaning. Both sons work during the day      Prior Functioning/Environment Level of Independence: Needs assistance  Gait / Transfers Assistance Needed: prior to 07/19/2019, pt ambulating with a walker. Since stay at SNF, has been unable to stand. Using stedy to transfer ADL's / Homemaking Assistance Needed: Requiring assist for ADL's from staff at SNF   Comments: rollator. drives herself to doctor appointments. son's do grocery shopping this year due to [place] exposure concerns.   Frequency  Min 2X/week        Progress Toward Goals  OT Goals(current goals can now be found in the care plan section)     Acute Rehab OT Goals Patient Stated Goal: to go home OT Goal Formulation: With patient Time For Goal Achievement: 09/06/19 Potential to Achieve Goals: Good ADL Goals Pt Will Perform Lower Body Dressing: with mod assist;with adaptive equipment;sitting/lateral leans;sit to/from stand Pt Will Transfer to Toilet: with mod assist;squat pivot transfer;bedside commode Pt Will  Perform Toileting - Clothing Manipulation and hygiene: with mod assist;sitting/lateral leans;sit to/from stand Pt/caregiver will Perform Home Exercise Program: Increased strength;Both right and left upper extremity;With Supervision Additional ADL Goal #1: Pt will increase to modA for ADL functional transfers  Plan      Co-evaluation                 AM-PAC OT "6 Clicks" Daily Activity     Outcome Measure   Help from another person eating meals?: None Help from another person taking care of personal grooming?: A Little Help from another person toileting, which includes using toliet, bedpan, or urinal?: Total Help from another person bathing (including washing, rinsing, drying)?: A Lot Help from another person to put on and taking off regular upper body clothing?: A Lot Help from another person to put on and taking off regular lower body clothing?: Total 6 Click Score: 13    End of Session Equipment Utilized During Treatment: Gait belt  OT Visit Diagnosis: Unsteadiness on feet (R26.81);Muscle weakness (generalized) (M62.81);Pain Pain - part of body: Knee   Activity Tolerance Patient tolerated treatment well   Patient Left in chair;with call bell/phone within reach   Nurse Communication Mobility status;Need for lift equipment        Time: [id number] OT Time Calculation (min): 18 min  Charges: OT General Charges $  OT Visit: 1 Visit OT Evaluation $OT Eval Moderate Complexity: 1 Mod OT Treatments $Self Care/Home Management : 8-22 mins $Therapeutic Activity: 8-22 mins  AllisonC OTR/L Acute Rehabilitation Services Pager: [phone] Office: [phone] C 08/23/2019, 3:26 PM

## 2019-08-23 NOTE — Progress Notes (Signed)
   Interim Summary: Erika [name] was admitted to [facility] hospitalist service on 1/15 for weakness, shortness of breath and swelling in the setting of HFpEF exacerbation. Cardiology was consulted for assistance with management. She has been diuresing well with IV Lasix 40 mg BID. Care was transferred to [facility] 1/17, as she if followed in clinic by Dr. Daryll Drown.   Subjective: Erika [name] is feeling well this morning. She denies any shortness of breath or chest pain. Notes her swelling has improved.    Objective:  Vital signs in last 24 hours: Vitals:   08/22/19 2050 08/23/19 0021 08/23/19 0452 08/23/19 0723  BP: 120/71 (!) 94/40 (!) 108/45   Pulse: 94 85 84   Resp: 20 20 18    Temp: 99.2 F (37.3 C) 98.8 F (37.1 C) 97.9 F (36.6 C)   TempSrc: Oral Oral Oral   SpO2: 97% 97% 91% 94%  Weight:   127.6 kg   Height:       General: awake, alert, sitting up in bed in NAD CV: RRR; SEM at RUSB  Pulm: normal work of breathing; lungs CTAB Ext: 1+ edema to knees bilaterally   Assessment/Plan:  Principal Problem:   Acute on chronic diastolic CHF (congestive heart failure) (HCC) Active Problems:   Obesity, Class III, BMI 40-49.9 (morbid obesity) (HCC)   Essential hypertension   Leukocytosis   Myeloproliferative disorder (HCC)   Aortic stenosis  Acute on chronic HFpEF - clinically improved - cardiology following; recommended holding IV lasix for today given creatinine bump - continue trending BMPs - strict I&Os, daily weights  - continue Lopressor  - PT/OT recommended CIR but not deemed a good candidate; TOC consulted for SNF placement   PAF - rate controlled on Lopressor - continue Eliquis    Dispo: Anticipated discharge in 1-2 days pending optimization of volume status.   Ladona Horns, MD 08/23/2019, 7:27 AM Pager: [phone]

## 2019-08-23 NOTE — Progress Notes (Signed)
Call from [facility] noting change in rhythm to Afib, which is noted as a previous diagnosis for patient. Rate is elevated 100-120, will proceed with ordered metoprolol and continue to monitor.

## 2019-08-23 NOTE — Progress Notes (Signed)
Rounded on patient to find OT beginning patient assist back to bed, appreciated by this RN/3E staff. Pt successfully transferred back to bed. Denies other complaints.

## 2019-08-23 NOTE — Evaluation (Signed)
Occupational Therapy Evaluation Patient Details Name: Erika [name] MRN: [id number] DOB: 1940/09/19 Today's Date: 08/23/2019    History of Present Illness Erika [name] is a 79 y.o. female with hx of HTN, remote SVT, paroxysmal atrial flutter on Eliquis, myeloproliferative neoplasm NOS (Followed by Dr. Irene Limbo), obesity, chronic neurophils, chronic back pain s/p laminectomy and  DDD presents weakness, inability to care for self, for the evaluation of CHF and elevated troponin. Discharged home one day from SNF.   Clinical Impression   Pt PTA: pt reports recent d/c from SNF. Pt lives with sons and requiring assist for ADL and mobility. Pt currently limited by pain in B/L knees, decreased strength, decreased mobility and decreased acitivty tolerance for functional tasks. Pt requiring set-upA to maxA/totalA for ADL at this time. Pt sitting EOB for ADL with set-upA. Pt using stedy with maxA +1 today (but much easier with +2) for transfer and sit to stands to recliner. Pt able to stand using stedy  for peri care ~2 mins. Pt would greatly benefit from continued OT skilled services for ADL, mobility and energy conservation.  OT to continue to work with pt on becoming more independent with lateral scooting. OT following acutely.  **Pt undecided about wanting to go to SNF. Pt would require lift equipment at home if she went home soon.        Follow Up Recommendations  SNF;Supervision/Assistance - 24 hour(if pt refusing SNF, pt with HH and lift equipment)    Equipment Recommendations  3 in 1 bedside commode;Other (comment)(bari 3in1; hoyer lift with pads if pt chooses d/c home)    Recommendations for Other Services       Precautions / Restrictions Precautions Precautions: Fall Restrictions Weight Bearing Restrictions: No      Mobility Bed Mobility Overal bed mobility: Needs Assistance Bed Mobility: Supine to Sit     Supine to sit: Min assist;Mod assist     General bed mobility  comments: Mod A for trunk elevation and sweeping BLEs to side  Transfers Overall transfer level: Needs assistance   Transfers: Sit to/from Stand Sit to Stand: Max assist;From elevated surface(+2 for future visit, but possible with +1 today)              Balance Overall balance assessment: Needs assistance Sitting-balance support: Feet unsupported Sitting balance-Leahy Scale: Good       Standing balance-Leahy Scale: Poor Standing balance comment: pt reliant on external support; holding onto stedy                           ADL either performed or assessed with clinical judgement   ADL Overall ADL's : Needs assistance/impaired Eating/Feeding: Set up;Sitting   Grooming: Set up;Sitting   Upper Body Bathing: Minimal assistance;Sitting Upper Body Bathing Details (indicate cue type and reason): minA for washing bacl; pt set-upA for front Lower Body Bathing: Moderate assistance;Maximal assistance;Sitting/lateral leans;Sit to/from stand Lower Body Bathing Details (indicate cue type and reason): using STEDY to stand upright for peri care Upper Body Dressing : Set up;Sitting   Lower Body Dressing: Maximal assistance;Total assistance;Sitting/lateral leans;Sit to/from stand Lower Body Dressing Details (indicate cue type and reason): not enough space between BLEs to donn underwear in standing Toilet Transfer: Maximal assistance;BSC Toilet Transfer Details (indicate cue type and reason): STEDY heavy maxA using bed pad to stand from elevated position Toileting- Clothing Manipulation and Hygiene: Maximal assistance;Sitting/lateral lean;Sit to/from stand Toileting - Clothing Manipulation Details (indicate cue type and reason):  maxA for pericare anterior and posterior     Functional mobility during ADLs: Maximal assistance;+2 for physical assistance;+2 for safety/equipment General ADL Comments: Pt limited by pain in B/L knees, decreased strength, decreased mobility and decreased  acitivty tolerance for functional tasks.     Vision Baseline Vision/History: No visual deficits Vision Assessment?: No apparent visual deficits     Perception     Praxis      Pertinent Vitals/Pain Pain Assessment: Faces Faces Pain Scale: Hurts little more Pain Location: bilateral knees with movement Pain Descriptors / Indicators: Discomfort;Grimacing Pain Intervention(s): Monitored during session     Hand Dominance Right   Extremity/Trunk Assessment Upper Extremity Assessment Upper Extremity Assessment: Overall WFL for tasks assessed RUE Deficits / Details: Strength 5/5 LUE Deficits / Details: Strength 5/5   Lower Extremity Assessment Lower Extremity Assessment: Generalized weakness;Defer to PT evaluation   Cervical / Trunk Assessment Cervical / Trunk Assessment: Other exceptions Cervical / Trunk Exceptions: large body habitus   Communication Communication Communication: No difficulties   Cognition Arousal/Alertness: Awake/alert Behavior During Therapy: WFL for tasks assessed/performed Overall Cognitive Status: Within Functional Limits for tasks assessed                                     General Comments  VSS on RA.    Exercises     Shoulder Instructions      Home Living Family/patient expects to be discharged to:: Private residence Living Arrangements: Children Available Help at Discharge: Family;Available PRN/intermittently Type of Home: Apartment Home Access: Level entry     Home Layout: One level     Bathroom Shower/Tub: Occupational psychologist: Standard     Home Equipment: Environmental consultant - 4 wheels;Other (comment);Hospital bed;Wheelchair - manual   Additional Comments: lives with one son, second son comes by to help out, adult grandaughter helps with housecleaning. Both sons work during the day      Prior Functioning/Environment Level of Independence: Needs assistance  Gait / Transfers Assistance Needed: prior to  07/19/2019, pt ambulating with a walker. Since stay at SNF, has been unable to stand. Using stedy to transfer ADL's / Homemaking Assistance Needed: Requiring assist for ADL's from staff at SNF   Comments: rollator. drives herself to doctor appointments. son's do grocery shopping this year due to [place] exposure concerns.        OT Problem List: Decreased range of motion;Decreased activity tolerance;Impaired balance (sitting and/or standing);Decreased knowledge of use of DME or AE;Pain;Increased edema      OT Treatment/Interventions: Self-care/ADL training;Therapeutic exercise;Energy conservation;DME and/or AE instruction;Therapeutic activities;Patient/family education;Balance training    OT Goals(Current goals can be found in the care plan section) Acute Rehab OT Goals Patient Stated Goal: to go home OT Goal Formulation: With patient Time For Goal Achievement: 09/06/19 Potential to Achieve Goals: Good ADL Goals Pt Will Perform Lower Body Dressing: with mod assist;with adaptive equipment;sitting/lateral leans;sit to/from stand Pt Will Transfer to Toilet: with mod assist;squat pivot transfer;bedside commode Pt Will Perform Toileting - Clothing Manipulation and hygiene: with mod assist;sitting/lateral leans;sit to/from stand Pt/caregiver will Perform Home Exercise Program: Increased strength;Both right and left upper extremity;With Supervision Additional ADL Goal #1: Pt will increase to modA for ADL functional transfers  OT Frequency: Min 2X/week   Barriers to D/C: Decreased caregiver support  sons work during the day       Co-evaluation  AM-PAC OT "6 Clicks" Daily Activity     Outcome Measure Help from another person eating meals?: None Help from another person taking care of personal grooming?: A Little Help from another person toileting, which includes using toliet, bedpan, or urinal?: Total Help from another person bathing (including washing, rinsing, drying)?:  A Lot Help from another person to put on and taking off regular upper body clothing?: A Lot Help from another person to put on and taking off regular lower body clothing?: Total 6 Click Score: 13   End of Session Equipment Utilized During Treatment: Gait belt Nurse Communication: Mobility status;Need for lift equipment  Activity Tolerance: Patient tolerated treatment well Patient left: in chair;with call bell/phone within reach  OT Visit Diagnosis: Unsteadiness on feet (R26.81);Muscle weakness (generalized) (M62.81);Pain Pain - part of body: Knee(b/l)                Time: [id number] OT Time Calculation (min): 43 min Charges:  OT General Charges $OT Visit: 1 Visit OT Evaluation $OT Eval Moderate Complexity: 1 Mod OT Treatments $Self Care/Home Management : 8-22 mins $Therapeutic Activity: 8-22 mins  Jefferey Pica OTR/L Acute Rehabilitation Services Pager: [phone] Office: [phone]   Aaima Gaddie C 08/23/2019, 2:45 PM

## 2019-08-23 NOTE — Progress Notes (Signed)
DAILY PROGRESS NOTE   Patient Name: Erika [name] Date of Encounter: 08/23/2019 Cardiologist: No primary care provider on file.  Chief Complaint   No complaints  Patient Profile   Erika [name] is a 79 y.o. female with hx of HTN, remote SVT, paroxysmal atrial flutter on Eliquis, myeloproliferative neoplasm NOS (Followed by Dr. Irene Limbo), obesity, chronic neurophils, chronic back pain s/p laminectomy and  DDD presents for evaluation of weakness and  is being seen today for the evaluation of CHF and elevated troponin  at the request of Dr. Francia Greaves.   Subjective   Diuresed another 1.3L overnight - creatinine up to 1.24. Echo shows LVEF 55-60%, moderate aortic stenosis (mean gradient 22 mmHg). Weight down 2 Kg.  Objective   Vitals:   08/23/19 0021 08/23/19 0452 08/23/19 0723 08/23/19 0821  BP: (!) 94/40 (!) 108/45  (!) 118/58  Pulse: 85 84  85  Resp: 20 18    Temp: 98.8 F (37.1 C) 97.9 F (36.6 C)  98.2 F (36.8 C)  TempSrc: Oral Oral  Oral  SpO2: 97% 91% 94% 97%  Weight:  127.6 kg    Height:        Intake/Output Summary (Last 24 hours) at 08/23/2019 0859 Last data filed at 08/23/2019 0820 Gross per 24 hour  Intake 1160 ml  Output 2276 ml  Net -1116 ml   Filed Weights   08/21/19 1842 08/22/19 0450 08/23/19 0452  Weight: 129 kg 129 kg 127.6 kg    Physical Exam   General appearance: alert, no distress and moderately obese Neck: no carotid bruit, no JVD and thyroid not enlarged, symmetric, no tenderness/mass/nodules Lungs: clear to auscultation bilaterally Heart: regular rate and rhythm, S1, S2 normal and systolic murmur: systolic ejection 3/6, crescendo at 2nd right intercostal space Abdomen: soft, non-tender; bowel sounds normal; no masses,  no organomegaly and obese Extremities: edema minimal Pulses: 2+ and symmetric Skin: Skin color, texture, turgor normal. No rashes or lesions Neurologic: Grossly normal Psych: Pleasant  Inpatient Medications    Scheduled  Meds: . allopurinol  150 mg Oral BID  . apixaban  5 mg Oral BID  . aspirin EC  81 mg Oral Daily  . fluticasone  1 spray Each Nare Daily  . furosemide  40 mg Intravenous BID  . metoprolol tartrate  50 mg Oral BID  . mometasone-formoterol  2 puff Inhalation BID  . pantoprazole  40 mg Oral Daily  . pravastatin  20 mg Oral Daily  . QUEtiapine  25 mg Oral QHS  . sodium chloride flush  3 mL Intravenous Q12H    Continuous Infusions: . sodium chloride      PRN Meds: sodium chloride, acetaminophen, albuterol, diphenhydrAMINE, guaiFENesin-dextromethorphan, ondansetron (ZOFRAN) IV, QUEtiapine, sodium chloride, sodium chloride flush, tiZANidine   Labs   Results for orders placed or performed during the hospital encounter of 08/21/19 (from the past 48 hour(s))  Basic metabolic panel     Status: Abnormal   Collection Time: 08/22/19  5:13 AM  Result Value Ref Range   Sodium 136 135 - 145 mmol/L   Potassium 4.0 3.5 - 5.1 mmol/L   Chloride 94 (L) 98 - 111 mmol/L   CO2 28 22 - 32 mmol/L   Glucose, Bld 26 (LL) 70 - 99 mg/dL    Comment: CRITICAL RESULT CALLED TO, READ BACK BY AND VERIFIED WITH: E.WINDHAM RN @ 0700 08/22/2019 BY C.EDENS    BUN 23 8 - 23 mg/dL   Creatinine, Ser 1.13 (H) 0.44 -  1.00 mg/dL   Calcium 9.3 8.9 - 10.3 mg/dL   GFR calc non Af Amer 47 (L) >60 mL/min   GFR calc Af Amer 55 (L) >60 mL/min   Anion gap 14 5 - 15    Comment: Performed at [facility] [address] [place], [place] [address]  Magnesium     Status: None   Collection Time: 08/22/19  5:13 AM  Result Value Ref Range   Magnesium 2.0 1.7 - 2.4 mg/dL    Comment: Performed at [facility] [address], [place], [place] [address]  Phosphorus     Status: Abnormal   Collection Time: 08/22/19  5:13 AM  Result Value Ref Range   Phosphorus 6.0 (H) 2.5 - 4.6 mg/dL    Comment: Performed at [facility] [address] [place], [place] [address]  Glucose, capillary     Status: None    Collection Time: 08/22/19  7:03 AM  Result Value Ref Range   Glucose-Capillary 77 70 - 99 mg/dL  CBC tmrw AM     Status: Abnormal   Collection Time: 08/23/19  6:06 AM  Result Value Ref Range   WBC 99.7 (HH) 4.0 - 10.5 K/uL    Comment: WHITE COUNT CONFIRMED ON SMEAR CRITICAL VALUE NOTED.  VALUE IS CONSISTENT WITH PREVIOUSLY REPORTED AND CALLED VALUE.    RBC 2.58 (L) 3.87 - 5.11 MIL/uL   Hemoglobin 8.0 (L) 12.0 - 15.0 g/dL   HCT 24.9 (L) 36.0 - 46.0 %   MCV 96.5 80.0 - 100.0 fL   MCH 31.0 26.0 - 34.0 pg   MCHC 32.1 30.0 - 36.0 g/dL   RDW 17.8 (H) 11.5 - 15.5 %   Platelets 193 150 - 400 K/uL   nRBC 0.0 0.0 - 0.2 %    Comment: Performed at [facility] [address], [place], [place] [id number]  Basic metabolic panel     Status: Abnormal   Collection Time: 08/23/19  6:06 AM  Result Value Ref Range   Sodium 135 135 - 145 mmol/L   Potassium 3.8 3.5 - 5.1 mmol/L   Chloride 94 (L) 98 - 111 mmol/L   CO2 31 22 - 32 mmol/L   Glucose, Bld 77 70 - 99 mg/dL   BUN 26 (H) 8 - 23 mg/dL   Creatinine, Ser 1.24 (H) 0.44 - 1.00 mg/dL   Calcium 8.9 8.9 - 10.3 mg/dL   GFR calc non Af Amer 42 (L) >60 mL/min   GFR calc Af Amer 49 (L) >60 mL/min   Anion gap 10 5 - 15    Comment: Performed at [facility] [address], [place],  [address]    ECG   N/A  Telemetry   Sinus rhythm - Personally Reviewed  Radiology    ECHOCARDIOGRAM COMPLETE  Result Date: 08/21/2019   ECHOCARDIOGRAM REPORT   Patient Name:   Erika [name] [facility] Date of Exam: 08/21/2019 Medical Rec #:  [id number]       Height:       68.0 in Accession #:    [id number]      Weight:       280.0 lb Date of Birth:  [birth year]        BSA:          2.36 m Patient Age:    35 years        BP:           113/63  mmHg Patient Gender: F               HR:           96 bpm. Exam Location:  Inpatient Procedure: 2D Echo Indications:    Elevated Troponin  History:        Patient has prior history of Echocardiogram examinations, most                  recent 06/02/2017. COPD, Arrythmias:Atrial Fibrillation; Risk                 Factors:Hypertension and Dyslipidemia.  Sonographer:    Vikki Ports Turrentine Referring Phys: [id number] [place]  1. Left ventricular ejection fraction, by visual estimation, is 55 to 60%. The left ventricle has normal function. There is mildly increased left ventricular hypertrophy.  2. The aortic valve is abnormal. Moderate calcifications. Aortic valve regurgitation is not visualized. Moderate aortic valve stenosis (Vmax 3.0 m/s, MG 22 mmHg, AVA 1.4 cm^2, DI 0.45)  3. Global right ventricle has normal systolic function.The right ventricular size is mildly enlarged.  4. Left atrial size was moderately dilated.  5. Right atrial size was mildly dilated.  6. The mitral valve is normal in structure. Trivial mitral valve regurgitation.  7. The tricuspid valve is normal in structure.  8. The pulmonic valve was not well visualized. Pulmonic valve regurgitation is not visualized.  9. The tricuspid regurgitant velocity is 3.28 m/s, and with an assumed right atrial pressure of 3 mmHg, the estimated right ventricular systolic pressure is moderately elevated at 46.0 mmHg. 10. The inferior vena cava is normal in size with greater than 50% respiratory variability, suggesting right atrial pressure of 3 mmHg. FINDINGS  Left Ventricle: Left ventricular ejection fraction, by visual estimation, is 55 to 60%. The left ventricle has normal function. The left ventricle has no regional wall motion abnormalities. There is mildly increased left ventricular hypertrophy. Left ventricular diastolic parameters were normal. Right Ventricle: The right ventricular size is mildly enlarged. No increase in right ventricular wall thickness. Global RV systolic function is has normal systolic function. The tricuspid regurgitant velocity is 3.28 m/s, and with an assumed right atrial  pressure of 3 mmHg, the estimated right ventricular systolic  pressure is moderately elevated at 46.0 mmHg. Left Atrium: Left atrial size was moderately dilated. Right Atrium: Right atrial size was mildly dilated Pericardium: There is no evidence of pericardial effusion. Mitral Valve: The mitral valve is normal in structure. Trivial mitral valve regurgitation. Tricuspid Valve: The tricuspid valve is normal in structure. Tricuspid valve regurgitation moderate. Aortic Valve: The aortic valve is abnormal. Aortic valve regurgitation is not visualized. Moderate aortic stenosis is present. There is moderate calcification of the aortic valve. Aortic valve mean gradient measures 18.2 mmHg. Aortic valve peak gradient measures 32.3 mmHg. Aortic valve area, by VTI measures 1.52 cm. Pulmonic Valve: The pulmonic valve was not well visualized. Pulmonic valve regurgitation is not visualized. Pulmonic regurgitation is not visualized. Aorta: The aortic root is normal in size and structure. Venous: The inferior vena cava is normal in size with greater than 50% respiratory variability, suggesting right atrial pressure of 3 mmHg. IAS/Shunts: The interatrial septum was not well visualized.  LEFT VENTRICLE PLAX 2D LVIDd:         4.71 cm  Diastology LVIDs:         3.29 cm  LV e' lateral:   10.80 cm/s LV PW:         0.89 cm  LV E/e' lateral: 11.2 LV IVS:        0.86 cm  LV e' medial:    9.14 cm/s LVOT diam:     2.00 cm  LV E/e' medial:  13.2 LV SV:         59 ml LV SV Index:   23.34 LVOT Area:     3.14 cm  RIGHT VENTRICLE RV S prime:     19.70 cm/s TAPSE (M-mode): 3.2 cm LEFT ATRIUM              Index       RIGHT ATRIUM           Index LA diam:        4.60 cm  1.95 cm/m  RA Area:     25.00 cm LA Vol (A2C):   88.7 ml  37.62 ml/m RA Volume:   80.10 ml  33.97 ml/m LA Vol (A4C):   113.0 ml 47.92 ml/m LA Biplane Vol: 105.0 ml 44.53 ml/m  AORTIC VALVE AV Area (Vmax):    1.56 cm AV Area (Vmean):   1.61 cm AV Area (VTI):     1.52 cm AV Vmax:           284.20 cm/s AV Vmean:          197.600 cm/s AV  VTI:            0.582 m AV Peak Grad:      32.3 mmHg AV Mean Grad:      18.2 mmHg LVOT Vmax:         141.00 cm/s LVOT Vmean:        101.000 cm/s LVOT VTI:          0.281 m LVOT/AV VTI ratio: 0.48  AORTA Ao Root diam: 2.50 cm MITRAL VALVE                         TRICUSPID VALVE MV Area (PHT): 3.99 cm              TR Peak grad:   43.0 mmHg MV PHT:        55.10 msec            TR Vmax:        328.00 cm/s MV Decel Time: 190 msec MV E velocity: 121.00 cm/s 103 cm/s  SHUNTS MV A velocity: 127.00 cm/s 70.3 cm/s Systemic VTI:  0.28 m MV E/A ratio:  0.95        1.5       Systemic Diam: 2.00 cm  Oswaldo Milian MD Electronically signed by Oswaldo Milian MD Signature Date/Time: 08/21/2019/8:37:08 PM    Final     Cardiac Studies   See echo above  Assessment   Principal Problem:   Acute on chronic diastolic CHF (congestive heart failure) (HCC) Active Problems:   Obesity, Class III, BMI 40-49.9 (morbid obesity) (HCC)   Essential hypertension   Leukocytosis   Myeloproliferative disorder (HCC)   Aortic stenosis   Plan   Ms. Herbold has a rising creatinine - likely at diuresis endpoint. Hold further IV lasix - may transition to oral lasix tomorrow. Have to be careful not to lower preload too much given moderate AS.  Time Spent Directly with Patient:  I have spent a total of 25 minutes with the patient reviewing hospital notes, telemetry, EKGs, labs and examining the patient as well as establishing an assessment and plan that was discussed personally with  the patient.  > 50% of time was spent in direct patient care.  Length of Stay:  LOS: 2 days   Pixie Casino, MD, [facility], [place] Director of the Advanced Lipid Disorders &  Cardiovascular Risk Reduction Clinic Diplomate of the American Board of Clinical Lipidology Attending Cardiologist  Direct Dial: [phone]  Fax: [phone]  Website:  [email or web address] 08/23/2019, 8:58  AM

## 2019-08-23 NOTE — Progress Notes (Signed)
Patient is awake, eating lunch in bed upon rounding.  OT is with the patient, to begin therapy.   Further UOP progress, suction canister emptied/charted.  Pt denies other complaints/needs at this time.

## 2019-08-24 DIAGNOSIS — I480 Paroxysmal atrial fibrillation: Secondary | ICD-10-CM

## 2019-08-24 DIAGNOSIS — Z79899 Other long term (current) drug therapy: Secondary | ICD-10-CM

## 2019-08-24 DIAGNOSIS — R011 Cardiac murmur, unspecified: Secondary | ICD-10-CM

## 2019-08-24 DIAGNOSIS — Z7901 Long term (current) use of anticoagulants: Secondary | ICD-10-CM

## 2019-08-24 LAB — BASIC METABOLIC PANEL
Anion gap: 15 (ref 5–15)
BUN: 26 mg/dL — ABNORMAL HIGH (ref 8–23)
CO2: 30 mmol/L (ref 22–32)
Calcium: 9.1 mg/dL (ref 8.9–10.3)
Chloride: 92 mmol/L — ABNORMAL LOW (ref 98–111)
Creatinine, Ser: 1.23 mg/dL — ABNORMAL HIGH (ref 0.44–1.00)
GFR calc Af Amer: 49 mL/min — ABNORMAL LOW (ref >60)
GFR calc non Af Amer: 43 mL/min — ABNORMAL LOW (ref >60)
Glucose, Bld: <20 mg/dL — CL (ref 70–99)
Potassium: 3.3 mmol/L — ABNORMAL LOW (ref 3.5–5.1)
Sodium: 137 mmol/L (ref 135–145)

## 2019-08-24 LAB — GLUCOSE, CAPILLARY
Glucose-Capillary: 86 mg/dL (ref 70–99)
Glucose-Capillary: 80 mg/dL (ref 70–99)
Glucose-Capillary: 90 mg/dL (ref 70–99)
Glucose-Capillary: 93 mg/dL (ref 70–99)

## 2019-08-24 LAB — MAGNESIUM: Magnesium: 1.8 mg/dL (ref 1.7–2.4)

## 2019-08-24 MED ORDER — FUROSEMIDE 40 MG PO TABS
40.0000 mg | ORAL_TABLET | Freq: Every day | ORAL | Status: DC
  Administered 2019-08-24 – 2019-08-27 (×4): 40 mg via ORAL
  Filled 2019-08-24 (×4): qty 1

## 2019-08-24 MED ORDER — POTASSIUM CHLORIDE CRYS ER 20 MEQ PO TBCR
40.0000 meq | EXTENDED_RELEASE_TABLET | Freq: Once | ORAL | Status: AC
  Administered 2019-08-24: 11:00:00 40 meq via ORAL
  Filled 2019-08-24: qty 2

## 2019-08-24 NOTE — Progress Notes (Signed)
Inpatient Rehab Admissions:  Inpatient Rehab Consult received.  Please see note from Raechel Ache, rehab admissions coordinator, on 1/16.  Per PT/OT evaluation, pt does not have 24/7 assist available at home, which would be the recommendation at discharge from CIR.  Will sign off at this time.   Signed: Shann Medal, PT, DPT Admissions Coordinator [phone] 08/24/19  3:15 PM

## 2019-08-24 NOTE — Progress Notes (Signed)
Lab called with critical result: Glucose 20. Assessed pt  Pt is alert and oriented x4 and denies feelings of hypoglycemia. CBG was 77 on glucometer.  Gave pt 8 oz juice.  Will recheck sugar.

## 2019-08-24 NOTE — Discharge Instructions (Signed)

## 2019-08-24 NOTE — Progress Notes (Signed)
Physical Therapy Treatment Patient Details Name: Erika [name] MRN: [id number] DOB: 05/18/1941 Today's Date: 08/24/2019    History of Present Illness Erika [name] is a 79 y.o. female with hx of HTN, remote SVT, paroxysmal atrial flutter on Eliquis, myeloproliferative neoplasm NOS (Followed by Dr. Irene Limbo), obesity, chronic neurophils, chronic back pain s/p laminectomy and  DDD presents weakness, inability to care for self, for the evaluation of CHF and elevated troponin. Discharged home one day from SNF.    PT Comments    Patient progressing slowly towards Pt goals. Highly motivated to improve strength and walk again. Did not have a good experience at [facility]. Today, worked on LE strengthening and standing tolerance with stedy. Continues to require stedy due to bil knee weakness. VSS throughout. Discharge recommendation updated to CIR as pt highly motivated and has family support. Will follow.   Follow Up Recommendations  Supervision for mobility/OOB;Supervision/Assistance - 24 hour;CIR     Equipment Recommendations  3in1 (PT)    Recommendations for Other Services       Precautions / Restrictions Precautions Precautions: Fall Restrictions Weight Bearing Restrictions: No    Mobility  Bed Mobility Overal bed mobility: Needs Assistance Bed Mobility: Supine to Sit     Supine to sit: Min guard;HOB elevated     General bed mobility comments: Able to get to EOB with heavy use of rail, no assist.  Transfers Overall transfer level: Needs assistance Equipment used: Ambulation equipment used Transfers: Sit to/from Stand Sit to Stand: Max assist;Mod assist;+2 physical assistance         General transfer comment: Practiced standing bouts from EOB x3 using stedy with Max A of 2 initially progressing to mod A from hip plates on stedy with cues for technique. Cues for hip extension and upright. Total A transfer to chair via stedy  Ambulation/Gait                  Stairs             Wheelchair Mobility    Modified Rankin (Stroke Patients Only)       Balance Overall balance assessment: Needs assistance Sitting-balance support: Feet supported;No upper extremity supported Sitting balance-Leahy Scale: Good     Standing balance support: During functional activity;Bilateral upper extremity supported Standing balance-Leahy Scale: Poor Standing balance comment: pt reliant on external support; holding onto stedy, Able to stand for ~45 seconds, ~40 seconds, 35 seconds with cues for hip extension, knee extension, breathing and upright posture                            Cognition Arousal/Alertness: Awake/alert Behavior During Therapy: WFL for tasks assessed/performed Overall Cognitive Status: Within Functional Limits for tasks assessed                                        Exercises      General Comments General comments (skin integrity, edema, etc.): VSS on RA.      Pertinent Vitals/Pain Pain Assessment: Faces Faces Pain Scale: Hurts little more Pain Location: bilateral knees with movement Pain Descriptors / Indicators: Discomfort;Grimacing Pain Intervention(s): Repositioned;Monitored during session    Home Living                      Prior Function  PT Goals (current goals can now be found in the care plan section) Progress towards PT goals: Progressing toward goals    Frequency    Min 3X/week      PT Plan Current plan remains appropriate    Co-evaluation              AM-PAC PT "6 Clicks" Mobility   Outcome Measure  Help needed turning from your back to your side while in a flat bed without using bedrails?: None Help needed moving from lying on your back to sitting on the side of a flat bed without using bedrails?: A Little Help needed moving to and from a bed to a chair (including a wheelchair)?: Total Help needed standing up from a chair using your arms  (e.g., wheelchair or bedside chair)?: A Lot Help needed to walk in hospital room?: Total Help needed climbing 3-5 steps with a railing? : Total 6 Click Score: 12    End of Session Equipment Utilized During Treatment: Gait belt Activity Tolerance: Patient tolerated treatment well Patient left: in chair;with call bell/phone within reach;with chair alarm set Nurse Communication: Mobility status;Need for lift equipment(stedy) PT Visit Diagnosis: Muscle weakness (generalized) (M62.81);Difficulty in walking, not elsewhere classified (R26.2)     Time: [id number] PT Time Calculation (min) (ACUTE ONLY): 23 min  Charges:  $Therapeutic Activity: 23-37 mins                     Marisa Severin, PT, DPT Acute Rehabilitation Services Pager [phone] Office [phone]       Marguarite Arbour A Sabra Heck 08/24/2019, 12:39 PM

## 2019-08-24 NOTE — Progress Notes (Signed)
DAILY PROGRESS NOTE   Patient Name: Erika [name] Date of Encounter: 08/24/2019 Cardiologist: Peter Martinique, MD  Chief Complaint   No complaints  Patient Profile   Erika [name] is a 79 y.o. female with hx of HTN, remote SVT, paroxysmal atrial flutter on Eliquis, myeloproliferative neoplasm NOS (Followed by Dr. Irene Limbo), obesity, chronic neurophils, chronic back pain s/p laminectomy and  DDD presents for evaluation of weakness and  is being seen today for the evaluation of CHF and elevated troponin  at the request of Dr. Francia Greaves.   Subjective   Negative another 1L overnight - creatinine stabilized at 1.23-1.24. K 3.3 today.  Objective   Vitals:   08/23/19 2118 08/24/19 0029 08/24/19 0518 08/24/19 0818  BP:  (!) 102/50 (!) 97/44   Pulse: 82 80 80   Resp: 18 18 18    Temp:  98 F (36.7 C) (!) 97.4 F (36.3 C)   TempSrc:  Oral Oral   SpO2: 94% 94% 98% 93%  Weight:   126.1 kg   Height:        Intake/Output Summary (Last 24 hours) at 08/24/2019 0906 Last data filed at 08/24/2019 0537 Gross per 24 hour  Intake 483 ml  Output 1800 ml  Net -1317 ml   Filed Weights   08/22/19 0450 08/23/19 0452 08/24/19 0518  Weight: 129 kg 127.6 kg 126.1 kg    Physical Exam   General appearance: alert, no distress and moderately obese Neck: no carotid bruit, no JVD and thyroid not enlarged, symmetric, no tenderness/mass/nodules Lungs: clear to auscultation bilaterally Heart: regular rate and rhythm, S1, S2 normal and systolic murmur: systolic ejection 3/6, crescendo at 2nd right intercostal space Abdomen: soft, non-tender; bowel sounds normal; no masses,  no organomegaly and obese Extremities: edema minimal Pulses: 2+ and symmetric Skin: Skin color, texture, turgor normal. No rashes or lesions Neurologic: Grossly normal Psych: Pleasant  Inpatient Medications    Scheduled Meds: . allopurinol  150 mg Oral BID  . apixaban  5 mg Oral BID  . aspirin EC  81 mg Oral Daily  .  fluticasone  1 spray Each Nare Daily  . furosemide  40 mg Oral Daily  . metoprolol tartrate  50 mg Oral BID  . mometasone-formoterol  2 puff Inhalation BID  . pantoprazole  40 mg Oral Daily  . potassium chloride  40 mEq Oral Once  . pravastatin  20 mg Oral Daily  . QUEtiapine  25 mg Oral QHS  . sodium chloride flush  3 mL Intravenous Q12H    Continuous Infusions: . sodium chloride      PRN Meds: sodium chloride, acetaminophen, albuterol, diphenhydrAMINE, guaiFENesin-dextromethorphan, ondansetron (ZOFRAN) IV, QUEtiapine, sodium chloride, sodium chloride flush, tiZANidine   Labs   Results for orders placed or performed during the hospital encounter of 08/21/19 (from the past 48 hour(s))  CBC tmrw AM     Status: Abnormal   Collection Time: 08/23/19  6:06 AM  Result Value Ref Range   WBC 99.7 (HH) 4.0 - 10.5 K/uL    Comment: WHITE COUNT CONFIRMED ON SMEAR CRITICAL VALUE NOTED.  VALUE IS CONSISTENT WITH PREVIOUSLY REPORTED AND CALLED VALUE.    RBC 2.58 (L) 3.87 - 5.11 MIL/uL   Hemoglobin 8.0 (L) 12.0 - 15.0 g/dL   HCT 24.9 (L) 36.0 - 46.0 %   MCV 96.5 80.0 - 100.0 fL   MCH 31.0 26.0 - 34.0 pg   MCHC 32.1 30.0 - 36.0 g/dL   RDW 17.8 (H) 11.5 -  15.5 %   Platelets 193 150 - 400 K/uL   nRBC 0.0 0.0 - 0.2 %    Comment: Performed at [facility], [place] [address], [place], [place] [id number]  Basic metabolic panel     Status: Abnormal   Collection Time: 08/23/19  6:06 AM  Result Value Ref Range   Sodium 135 135 - 145 mmol/L   Potassium 3.8 3.5 - 5.1 mmol/L   Chloride 94 (L) 98 - 111 mmol/L   CO2 31 22 - 32 mmol/L   Glucose, Bld 77 70 - 99 mg/dL   BUN 26 (H) 8 - 23 mg/dL   Creatinine, Ser 1.24 (H) 0.44 - 1.00 mg/dL   Calcium 8.9 8.9 - 10.3 mg/dL   GFR calc non Af Amer 42 (L) >60 mL/min   GFR calc Af Amer 49 (L) >60 mL/min   Anion gap 10 5 - 15    Comment: Performed at [facility] [address] [place], [place] [id number]  Basic metabolic panel     Status: Abnormal    Collection Time: 08/24/19  4:51 AM  Result Value Ref Range   Sodium 137 135 - 145 mmol/L   Potassium 3.3 (L) 3.5 - 5.1 mmol/L   Chloride 92 (L) 98 - 111 mmol/L   CO2 30 22 - 32 mmol/L   Glucose, Bld <20 (LL) 70 - 99 mg/dL    Comment: REPEATED TO VERIFY CRITICAL RESULT CALLED TO, READ BACK BY AND VERIFIED WITH: SMITH M,RN 08/24/19 0700 WAYK    BUN 26 (H) 8 - 23 mg/dL   Creatinine, Ser 1.23 (H) 0.44 - 1.00 mg/dL   Calcium 9.1 8.9 - 10.3 mg/dL   GFR calc non Af Amer 43 (L) >60 mL/min   GFR calc Af Amer 49 (L) >60 mL/min   Anion gap 15 5 - 15    Comment: Performed at [facility] [address], [place], [place] [address]  Magnesium     Status: None   Collection Time: 08/24/19  4:51 AM  Result Value Ref Range   Magnesium 1.8 1.7 - 2.4 mg/dL    Comment: Performed at [facility] [address] [place],  [address]  Glucose, capillary     Status: None   Collection Time: 08/24/19  7:19 AM  Result Value Ref Range   Glucose-Capillary 86 70 - 99 mg/dL    ECG   N/A  Telemetry   Sinus rhythm - Personally Reviewed  Radiology    No results found.  Cardiac Studies   See echo above  Assessment   Principal Problem:   Acute on chronic diastolic CHF (congestive heart failure) (HCC) Active Problems:   Obesity, Class III, BMI 40-49.9 (morbid obesity) ([place])   Essential hypertension   Leukocytosis   Myeloproliferative disorder ([place])   Aortic stenosis   Plan   Erika [name] is euvolemic at this point - creatinine has stabilized. I think she could likely be discharged from a cardiac standpoint. Seems that she may need rehab to improve strength and walking. She will need follow-up of aortic stenosis. Will order potassium repletion today. Transition to oral lasix. Follow-up with Dr. Martinique or APP at [facility].  [facility] will sign off.   Medication Recommendations:  As above Other recommendations (labs, testing, etc):  none Follow up as an outpatient:   Dr. Martinique   Time Spent Directly with Patient:  I have spent a total of 25 minutes with the patient  reviewing hospital notes, telemetry, EKGs, labs and examining the patient as well as establishing an assessment and plan that was discussed personally with the patient.  > 50% of time was spent in direct patient care.  Length of Stay:  LOS: 3 days   Pixie Casino, MD, [facility], [place] Director of the Advanced Lipid Disorders &  Cardiovascular Risk Reduction Clinic Diplomate of the American Board of Clinical Lipidology Attending Cardiologist  Direct Dial: [phone]  Fax: [phone]  Website:  [email or web address] 08/24/2019, 9:06 AM

## 2019-08-24 NOTE — Progress Notes (Signed)
   Subjective: Pt seen at the bedside this morning. Feeling well. Denies shortness of breath, chest pain, palpitations, or leg swelling. No acute concerns at this time. She is interested in discharge to CIR or SNF placement due to decreased mobility, waiting to speak with social work.   Objective:  Vital signs in last 24 hours: Vitals:   08/23/19 1942 08/23/19 2118 08/24/19 0029 08/24/19 0518  BP: (!) 124/57  (!) 102/50 (!) 97/44  Pulse: 90 82 80 80  Resp: 18 18 18 18   Temp: 98.9 F (37.2 C)  98 F (36.7 C) (!) 97.4 F (36.3 C)  TempSrc: Oral  Oral Oral  SpO2: 94% 94% 94% 98%  Weight:    126.1 kg  Height:       Physical Exam Vitals and nursing note reviewed.  Constitutional:      General: She is not in acute distress.    Appearance: She is not ill-appearing.  Cardiovascular:     Rate and Rhythm: Normal rate and regular rhythm.     Heart sounds: Murmur present.  Pulmonary:     Effort: Pulmonary effort is normal.     Breath sounds: Normal breath sounds.  Musculoskeletal:     Comments: Trace dependent pitting edema of bilateral LE up to the knees.  Skin:    General: Skin is warm and dry.  Neurological:     Mental Status: She is alert.    Assessment/Plan:  Principal Problem:   Acute on chronic diastolic CHF (congestive heart failure) (HCC) Active Problems:   Obesity, Class III, BMI 40-49.9 (morbid obesity) ([place])   Essential hypertension   Leukocytosis   Myeloproliferative disorder (HCC)   Aortic stenosis  Acute on chronic HFpEF - clinically improved and euvolemic - net negative 1L yesterday with weight down 1.5kg - K 3.3, Cr stablized 1.23 << 1.24 << 1.13 Cardiology consulted  - transition to PO lasix  - K repletion  - needs rehab to improve strength and walking  - outpatient follow-up for aortic stenosis, Dr. Martinique at [facility] - continue trending BMPs - strict I&Os, daily weights  - continue Lopressor   - CIR initially deemed pt to not be a good  candidate, however PT continues to recommend CIR given pt is highly motivated and has family support - re-consulted CIR - TOC consult also in for SNF placement  PAF - rate controlled on Lopressor - continue Eliquis    Diet - heart healthy Fluids - none DVT ppx - apixaban 5mg  BID CODE STATUS - FULL CODE  Dispo: Medically stable for discharge, awaiting CIR eval and safe discharge plan   Ladona Horns, MD 08/24/2019, 6:18 AM Pager: [phone]

## 2019-08-24 NOTE — Progress Notes (Signed)
  Date: 08/24/2019  Patient name: Erika [name] record number: [id number]  Date of birth: [birth year]        I have seen and evaluated this patient and I have discussed the plan of care with the house staff. Please see their note for complete details. I concur with their findings with the following additions/corrections: Erika [name] was seen this morning on team rounds.  She is euvolemic and medically stable to be transferred once a SNF.  Is arranged.  Bartholomew Crews, MD 08/24/2019, 4:43 PM

## 2019-08-25 LAB — GLUCOSE, CAPILLARY
Glucose-Capillary: 79 mg/dL (ref 70–99)
Glucose-Capillary: 92 mg/dL (ref 70–99)

## 2019-08-25 LAB — BASIC METABOLIC PANEL
Anion gap: 10 (ref 5–15)
BUN: 24 mg/dL — ABNORMAL HIGH (ref 8–23)
CO2: 31 mmol/L (ref 22–32)
Calcium: 9.2 mg/dL (ref 8.9–10.3)
Chloride: 97 mmol/L — ABNORMAL LOW (ref 98–111)
Creatinine, Ser: 1.21 mg/dL — ABNORMAL HIGH (ref 0.44–1.00)
GFR calc Af Amer: 50 mL/min — ABNORMAL LOW (ref >60)
GFR calc non Af Amer: 43 mL/min — ABNORMAL LOW (ref >60)
Glucose, Bld: 51 mg/dL — ABNORMAL LOW (ref 70–99)
Potassium: 4.1 mmol/L (ref 3.5–5.1)
Sodium: 138 mmol/L (ref 135–145)

## 2019-08-25 LAB — PATHOLOGIST SMEAR REVIEW

## 2019-08-25 NOTE — Plan of Care (Signed)
  Problem: Clinical Measurements: Goal: Will remain free from infection Outcome: Progressing Note:  Pt has remained free from infection during my care.    Problem: Activity: Goal: Risk for activity intolerance will decrease Outcome: Progressing Note: Pt is able to get OOB to the [facility] with the steedy during my care.    Problem: Nutrition: Goal: Adequate nutrition will be maintained 08/25/2019 1533 by Earney Hamburg, RN Note: Pt has been able to eat at least 75% of all meals during my care.  08/25/2019 1533 by Earney Hamburg, RN Reactivated   Problem: Coping: Goal: Level of anxiety will decrease Reactivated   Problem: Elimination: Goal: Will not experience complications related to bowel motility Reactivated Goal: Will not experience complications related to urinary retention Reactivated

## 2019-08-25 NOTE — Care Management Important Message (Signed)
Important Message  Patient Details  Name: Erika [name] MRN: [id number] Date of Birth: 03-Jun-1941   Medicare Important Message Given:  Yes     [name] 08/25/2019, 12:44 PM

## 2019-08-25 NOTE — TOC Progression Note (Signed)
Transition of Care [facility]) - Progression Note    Patient Details  Name: Erika [name] MRN: [id number] Date of Birth: 11/03/1940  Transition of Care [facility]) CM/SW [place], [place] Work Phone Number: 08/25/2019, 5:13 PM  Clinical Narrative:    MSW Intern spoke with PT's son chris. He seemed Confused and irritated to learn that his mother was no longer planning on going to rehab. MSW Intern began the discussion about DME and HH. Son was disinterested in having the discussion and voiced his concerns about her coming home, including accessibility issues. MSW Intern allowed space for his concerns and encouraged him to speak with his family about the concerns. He asked to speak with MSW Intern tomorrow, however another SW's information was given, because they are coverage. Son says he will follow up tomorrow before 5. SW will continue to follow.  Expected Discharge Plan: [place] Barriers to Discharge: Equipment Delay  Expected Discharge Plan and Services Expected Discharge Plan: [place] Choice: Home Health, Durable Medical Equipment Living arrangements for the past 2 months: Apartment                 DME Arranged: Wheelchair manual, Environmental consultant, 3-N-1 DME Agency: [employer]       HH Arranged: PT, OT           Social Determinants of Health (SDOH) Interventions    Readmission Risk Interventions Readmission Risk Prevention Plan 08/04/2019  Transportation Screening Complete  PCP or Specialist Appt within 3-5 Days Complete  HRI or [place] Complete  Social Work Consult for [place] Planning/Counseling Complete  Palliative Care Screening Not Applicable  Medication Review Press photographer) Complete  Some recent data might be hidden

## 2019-08-25 NOTE — Plan of Care (Signed)
  Problem: Education: Goal: Knowledge of General Education information will improve Description: Including pain rating scale, medication(s)/side effects and non-pharmacologic comfort measures Outcome: Progressing   Problem: Health Behavior/Discharge Planning: Goal: Ability to manage health-related needs will improve Outcome: Progressing   Problem: Clinical Measurements: Goal: Ability to maintain clinical measurements within normal limits will improve Outcome: Progressing Goal: Will remain free from infection Outcome: Progressing Goal: Diagnostic test results will improve Outcome: Progressing Goal: Respiratory complications will improve Outcome: Progressing Goal: Cardiovascular complication will be avoided Outcome: Progressing   Problem: Activity: Goal: Risk for activity intolerance will decrease Outcome: Progressing   Problem: Education: Goal: Ability to demonstrate management of disease process will improve Outcome: Progressing Goal: Ability to verbalize understanding of medication therapies will improve Outcome: Progressing Goal: Individualized Educational Video(s) Outcome: Progressing   Problem: Activity: Goal: Capacity to carry out activities will improve Outcome: Progressing   Problem: Cardiac: Goal: Ability to achieve and maintain adequate cardiopulmonary perfusion will improve Outcome: Progressing   

## 2019-08-25 NOTE — TOC Progression Note (Signed)
Transition of Care [facility]) - Progression Note    Patient Details  Name: Erika [name] MRN: [id number] Date of Birth: 1940-08-10  Transition of Care [facility]) CM/SW [place], [place] Phone Number: 08/25/2019, 5:01 PM  Clinical Narrative:   CSW received call from Admissions at [facility] upon reviewing patient's referral, and indicated that patient has used her SNF days and would be private pay. CSW met with patient to discuss how the insurance will not cover her to return to SNF at this time and that she will either need to private pay for SNF or go home with home health and equipment. Patient indicated that she does not have the financial resources to private pay for SNF, so will need to be setup to return home. CSW asked about equipment, and patient said she has no equipment at home. CSW discussed equipment needs based on performance with PT today, and patient said that anything that she would qualify for would be helpful. CSW asked if patient's son who lives with her would be able to learn how to use a lift to assist her with getting up out of bed and she thought that he would be able to help out with that. Patient said to reach out to [name] first to talk about equipment, and then to talk to Gerald Stabs if [name] wasn't available.  CSW attempted to reach out to patient's son, [name], per patient's request. [name] didn't answer, left a voicemail. CSW to follow.    Expected Discharge Plan: [place] Barriers to Discharge: Equipment Delay  Expected Discharge Plan and Services Expected Discharge Plan: [place] Choice: Home Health, Durable Medical Equipment Living arrangements for the past 2 months: Apartment                 DME Arranged: Wheelchair manual, Environmental consultant, 3-N-1 DME Agency: [employer]       HH Arranged: PT, OT           Social Determinants of Health (SDOH) Interventions    Readmission Risk  Interventions Readmission Risk Prevention Plan 08/04/2019  Transportation Screening Complete  PCP or Specialist Appt within 3-5 Days Complete  HRI or [place] Complete  Social Work Consult for [place] Planning/Counseling Complete  Palliative Care Screening Not Applicable  Medication Review Press photographer) Complete  Some recent data might be hidden

## 2019-08-25 NOTE — NC FL2 (Signed)
[place] LEVEL OF CARE SCREENING TOOL     IDENTIFICATION  Patient Name: Erika [name] Birthdate: 12/23/1940 Sex: female Admission Date (Current Location): 08/21/2019  [facility] and [place] Number:  Herbalist and Address:  [facility], [place] [address] [place], [place] [address]      Provider Number: [id number]  Attending Physician Name and Address:  Axel Filler, *  Relative Name and Phone Number:  Trimeka Gander [phone]    Current Level of Care: Hospital Recommended Level of Care: [place] Prior Approval Number:    Date Approved/Denied:   PASRR Number: [id number]  Discharge Plan: SNF    Current Diagnoses: Patient Active Problem List   Diagnosis Date Noted  . Aortic stenosis 08/22/2019  . Acute diastolic (congestive) heart failure ([place]) 08/21/2019  . Acute on chronic diastolic CHF (congestive heart failure) ([place])   . General weakness   . AMS (altered mental status) 08/01/2019  . Acute lower UTI 08/01/2019  . Agitation 07/24/2019  . Tumor lysis syndrome   . Hyperuricemia   . Thrombocytopenia ([place])   . Myeloproliferative disorder ([place]) 07/10/2019  . AKI (acute kidney injury) ([place]) 07/10/2019  . AF (paroxysmal atrial fibrillation) ([place]) 06/02/2017  . Leukocytosis 05/29/2017  . Anemia 05/29/2017  . Leg swelling 02/23/2016  . Hand cramps 11/22/2015  . GERD (gastroesophageal reflux disease) 02/09/2013  . OA (osteoarthritis) of knee 01/29/2012  . OA (osteoarthritis of spine) 10/18/2011  . Healthcare maintenance 04/25/2011  . Hypokalemia 02/26/2008  . Moderate persistent asthma 02/10/2008  . Hyperlipidemia 08/04/2007  . H/O Herpes zoster 08/29/2006  . Obesity, Class III, BMI 40-49.9 (morbid obesity) ([place]) 08/29/2006  . History of breast cyst 08/29/2006  . Essential hypertension 08/27/2006  . 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 08/01/1999    Orientation RESPIRATION BLADDER Height & Weight     Self, Time, Situation, Place  Normal External catheter, Incontinent Weight: 278 lb (126.1 kg) Height:  5\' 7"  (170.2 cm)  BEHAVIORAL SYMPTOMS/MOOD NEUROLOGICAL BOWEL NUTRITION STATUS      Continent Diet(See Discharge Summary)  AMBULATORY STATUS COMMUNICATION OF NEEDS Skin   Limited Assist Verbally Skin abrasions(R thigh and posterier abrasions, MASD on Buttock W/ skin cream)                       Personal Care Assistance Level of Assistance  Bathing, Feeding, Dressing Bathing Assistance: Maximum assistance Feeding assistance: Independent Dressing Assistance: Maximum assistance     Functional Limitations Info  Sight, Hearing, Speech Sight Info: Adequate Hearing Info: Adequate Speech Info: Adequate    SPECIAL CARE FACTORS FREQUENCY  PT (By licensed PT), OT (By licensed OT)     PT Frequency: 5x week OT Frequency: 5x week            Contractures Contractures Info: Present    Additional Factors Info  Code Status, Allergies Code Status Info: Full code Allergies Info: Prednisone           Current Medications (08/25/2019):  This is the current hospital active medication list Current Facility-Administered Medications  Medication Dose Route Frequency Provider Last Rate Last Admin  . 0.9 %  sodium chloride infusion  250 mL Intravenous PRN Wynetta Fines T, MD      . acetaminophen (TYLENOL) tablet 650 mg  650 mg Oral Q4H PRN Lequita Halt, MD   650 mg at 08/23/19 2037  .  albuterol (PROVENTIL) (2.5 MG/3ML) 0.083% nebulizer solution 3 mL  3 mL Inhalation Q6H PRN Wynetta Fines T, MD      . allopurinol (ZYLOPRIM) tablet 150 mg  150 mg Oral BID Wynetta Fines T, MD   150 mg at 08/25/19 0918  . apixaban (ELIQUIS) tablet 5 mg  5 mg Oral BID Wynetta Fines T, MD   5 mg at 08/25/19 [id number]  . aspirin EC tablet 81 mg  81 mg Oral Daily Wynetta Fines T, MD   81 mg at 08/25/19 0917  . diphenhydrAMINE  (BENADRYL) capsule 25 mg  25 mg Oral Q8H PRN Wynetta Fines T, MD      . fluticasone [facility]) 50 MCG/ACT nasal spray 1 spray  1 spray Each Nare Daily Lequita Halt, MD   1 spray at 08/25/19 0918  . furosemide (LASIX) tablet 40 mg  40 mg Oral Daily Pixie Casino, MD   40 mg at 08/25/19 0918  . guaiFENesin-dextromethorphan (ROBITUSSIN DM) 100-10 MG/5ML syrup 5 mL  5 mL Oral Q4H PRN Wynetta Fines T, MD   5 mL at 08/21/19 2026  . metoprolol tartrate (LOPRESSOR) tablet 50 mg  50 mg Oral BID Wynetta Fines T, MD   50 mg at 08/25/19 0917  . mometasone-formoterol (DULERA) 200-5 MCG/ACT inhaler 2 puff  2 puff Inhalation BID Lequita Halt, MD   2 puff at 08/25/19 [phone]  . ondansetron (ZOFRAN) injection 4 mg  4 mg Intravenous Q6H PRN Wynetta Fines T, MD      . pantoprazole (PROTONIX) EC tablet 40 mg  40 mg Oral Daily Wynetta Fines T, MD   40 mg at 08/25/19 0917  . pravastatin (PRAVACHOL) tablet 20 mg  20 mg Oral Daily Wynetta Fines T, MD   20 mg at 08/25/19 0917  . QUEtiapine (SEROQUEL) tablet 12.5 mg  12.5 mg Oral BID PRN Wynetta Fines T, MD      . QUEtiapine (SEROQUEL) tablet 25 mg  25 mg Oral QHS Wynetta Fines T, MD   25 mg at 08/24/19 2114  . sodium chloride (OCEAN) 0.65 % nasal spray 1 spray  1 spray Each Nare PRN Wynetta Fines T, MD      . sodium chloride flush (NS) 0.9 % injection 3 mL  3 mL Intravenous Q12H Wynetta Fines T, MD   3 mL at 08/25/19 0923  . sodium chloride flush (NS) 0.9 % injection 3 mL  3 mL Intravenous PRN Wynetta Fines T, MD      . tiZANidine (ZANAFLEX) tablet 4 mg  4 mg Oral Q8H PRN Lequita Halt, MD         Discharge Medications: Please see discharge summary for a list of discharge medications.  Relevant Imaging Results:  Relevant Lab Results:   Additional Information SS# [id number]  Kirstie Peri, Student-Social Work

## 2019-08-25 NOTE — Progress Notes (Signed)
   Subjective: Pt seen at the bedside this morning. Feeling well, without acute concerns. Expresses desire to continue to work with PT in order to improve mobility/walking. Denies chest pain or shortness of breath.  Objective:  Vital signs in last 24 hours: Vitals:   08/24/19 2002 08/24/19 2013 08/25/19 0051 08/25/19 0358  BP: (!) 110/51  112/63 (!) 122/58  Pulse: 80  84 86  Resp: 17  17 17   Temp: 98.2 F (36.8 C)  98.2 F (36.8 C) 98 F (36.7 C)  TempSrc: Oral  Oral Oral  SpO2: 98% 99% 92% 94%  Weight:   126.1 kg   Height:       Physical Exam Vitals and nursing note reviewed.  Constitutional:      General: She is not in acute distress.    Appearance: She is not ill-appearing.  Cardiovascular:     Rate and Rhythm: Normal rate and regular rhythm.     Heart sounds: Murmur present.  Pulmonary:     Effort: Pulmonary effort is normal.  Musculoskeletal:     Comments: Trace bilateral pitting edema  Skin:    General: Skin is warm and dry.  Neurological:     Mental Status: She is alert.    Assessment/Plan:  Principal Problem:   Acute on chronic diastolic CHF (congestive heart failure) (HCC) Active Problems:   Obesity, Class III, BMI 40-49.9 (morbid obesity) ([place])   Essential hypertension   Leukocytosis   Myeloproliferative disorder (HCC)   Aortic stenosis  Acute on chronic HFpEF Clinically stable and euvolemic. - net negative 1.2L yesterday, with weight stable at 126.1kg - K improved to 4.1, Cr stable at 1.2 - continue PO lasix 40mg  daily Cardiology consulted             - needs rehab to improve strength and walking             - outpatient follow-up for aortic stenosis, Dr. Martinique at [facility] - strict I&Os, daily weights  - continue Lopressor   PAF - rate controlled on Lopressor - continue Eliquis  Diet - heart healthy Fluids - none DVT ppx - apixaban 5mg  BID CODE STATUS - FULL CODE  Dispo: Medically stable. Anticipated discharge pending SNF  placement. TOC consulted, insurance authorization pending, COVID test order placed.  Ladona Horns, MD 08/25/2019, 6:32 AM Pager: [phone]

## 2019-08-25 NOTE — Progress Notes (Signed)
Physical Therapy Treatment Patient Details Name: Erika [name] MRN: [id number] DOB: 02-08-1941 Today's Date: 08/25/2019    History of Present Illness Erika [name] is a 79 y.o. female with hx of HTN, remote SVT, paroxysmal atrial flutter on Eliquis, myeloproliferative neoplasm NOS (Followed by Dr. Irene Limbo), obesity, chronic neurophils, chronic back pain s/p laminectomy and  DDD presents weakness, inability to care for self, for the evaluation of CHF and elevated troponin. Discharged home one day from SNF.    PT Comments    Pt progressing with functional mobility today, able to tolerate increased amount of time in standing and performed sit<>stands within stedy with less assist than previous session. Pt performed x 1 sit<>stand from elevated bed within stedy Mod assist, performed x 4 sit<>stands from elevated stedy seat with min assist. Pt completed upper body bathing while maintaining sitting balance on stedy seat, pt maintained standing balance with use of stedy while therapist assisted with lower body bathing. Discussed progressing to RW next therapy session, pt very eager and willing to try. Will continue to follow acutely to progress functional mobility, continue to recommend CIR to maximize functional independence with mobility.    Follow Up Recommendations  Supervision for mobility/OOB;Supervision/Assistance - 24 hour;CIR     Equipment Recommendations  3in1 (PT)    Recommendations for Other Services       Precautions / Restrictions Precautions Precautions: Fall Restrictions Weight Bearing Restrictions: No    Mobility  Bed Mobility Overal bed mobility: Needs Assistance Bed Mobility: Supine to Sit Rolling: Min assist   Supine to sit: HOB elevated;Supervision     General bed mobility comments: Able to get to EOB with heavy use of rail, no assist.  Transfers Overall transfer level: Needs assistance Equipment used: Ambulation equipment used Transfers: Sit to/from  Stand Sit to Stand: Min assist;Mod assist;From elevated surface         General transfer comment: Standing from elevated EOB within stedy Mod assist +1 today, performed repeated sit<>stands (x 4) from elevated stedy seat with min assist and pt pulling her self up with stedy grab bar. In standing cues for increased hip/trunk extension.  Ambulation/Gait                 Stairs             Wheelchair Mobility    Modified Rankin (Stroke Patients Only)       Balance Overall balance assessment: Needs assistance Sitting-balance support: Feet supported;No upper extremity supported Sitting balance-Leahy Scale: Good Sitting balance - Comments: Pt seated on stedy seat without UE support while washing her face and upper body   Standing balance support: During functional activity;Bilateral upper extremity supported Standing balance-Leahy Scale: Poor Standing balance comment: Pt reliant on UE support using stedy to maintain standing balance, pt able to maintain standing balance within stedy with supervision while therapist assisted with lower body bathing today. Pt maintained standing position within stedy for bouts of 1-2 min                            Cognition Arousal/Alertness: Awake/alert Behavior During Therapy: WFL for tasks assessed/performed Overall Cognitive Status: Within Functional Limits for tasks assessed                                        Exercises      General Comments  Pertinent Vitals/Pain Pain Assessment: No/denies pain    Home Living                      Prior Function            PT Goals (current goals can now be found in the care plan section) Progress towards PT goals: Progressing toward goals    Frequency    Min 3X/week      PT Plan Current plan remains appropriate    Co-evaluation              AM-PAC PT "6 Clicks" Mobility   Outcome Measure  Help needed turning from your  back to your side while in a flat bed without using bedrails?: None Help needed moving from lying on your back to sitting on the side of a flat bed without using bedrails?: A Little Help needed moving to and from a bed to a chair (including a wheelchair)?: Total Help needed standing up from a chair using your arms (e.g., wheelchair or bedside chair)?: A Lot Help needed to walk in hospital room?: Total Help needed climbing 3-5 steps with a railing? : Total 6 Click Score: 12    End of Session Equipment Utilized During Treatment: Gait belt Activity Tolerance: Patient tolerated treatment well Patient left: in chair;with call bell/phone within reach;with chair alarm set Nurse Communication: Mobility status;Need for lift equipment(stedy) PT Visit Diagnosis: Muscle weakness (generalized) (M62.81);Difficulty in walking, not elsewhere classified (R26.2)     Time: [id number] PT Time Calculation (min) (ACUTE ONLY): 23 min  Charges:  $Therapeutic Activity: 23-37 mins(23)                     Netta Corrigan, PT, DPT, CSRS Acute Rehab Office [place] 08/25/2019, 11:20 AM

## 2019-08-25 NOTE — TOC Initial Note (Signed)
Transition of Care [facility]) - Initial/Assessment Note    Patient Details  Name: Erika [name] MRN: [id number] Date of Birth: Jul 09, 1941  Transition of Care [facility]) CM/SW Contact:    Kirstie Peri, [place] Work Phone Number: 08/25/2019, 11:19 AM  Clinical Narrative:                  MSW Intern went to speak with PT to discuss goals of care. Pt was sitting on edge of bed, alert, and pleasant. PT was interested in rehab, but we did discuss that she had a bad experience at [facility] and would not like to go back.  She stated that she has a lot of help at home from her sons, but feels like she needs rehab to get walking again, before she feels safe going home. She requested to remain in [place], if at all possible. SW will continue to follow. Expected Discharge Plan: Skilled Nursing Facility Barriers to Discharge: Continued Medical Work up   Patient Goals and CMS Choice Patient states their goals for this hospitalization and ongoing recovery are:: Patient would like to go to rehab, so she can get stronger, and be able to go home. CMS Medicare.gov Compare Post Acute Care list provided to:: Patient Choice offered to / list presented to : Patient  Expected Discharge Plan and Services Expected Discharge Plan: [place] Choice: [place] Living arrangements for the past 2 months: [place]                                      Prior Living Arrangements/Services Living arrangements for the past 2 months: [place] Lives with:: Adult Children Patient language and need for interpreter reviewed:: Yes Do you feel safe going back to the place where you live?: Yes      Need for Family Participation in Patient Care: Yes (Comment) Care giver support system in place?: Yes (comment)   Criminal Activity/Legal Involvement Pertinent to Current Situation/Hospitalization: No - Comment as needed  Activities of  Daily Living Home Assistive Devices/Equipment: None ADL Screening (condition at time of admission) Patient's cognitive ability adequate to safely complete daily activities?: Yes Is the patient deaf or have difficulty hearing?: No Does the patient have difficulty seeing, even when wearing glasses/contacts?: No Does the patient have difficulty concentrating, remembering, or making decisions?: No Patient able to express need for assistance with ADLs?: Yes Does the patient have difficulty dressing or bathing?: Yes Independently performs ADLs?: No Communication: Independent Dressing (OT): Needs assistance Is this a change from baseline?: Pre-admission baseline Grooming: Needs assistance Is this a change from baseline?: Pre-admission baseline Feeding: Independent Is this a change from baseline?: Pre-admission baseline Bathing: Needs assistance Is this a change from baseline?: Pre-admission baseline Toileting: Needs assistance Is this a change from baseline?: Pre-admission baseline In/Out Bed: Needs assistance Is this a change from baseline?: Pre-admission baseline Walks in Home: Needs assistance Is this a change from baseline?: Pre-admission baseline Does the patient have difficulty walking or climbing stairs?: Yes Weakness of Legs: Both Weakness of Arms/Hands: None  Permission Sought/Granted Permission sought to share information with : Facility Sport and exercise psychologist    Share Information with NAME: Arminda [place]es     Permission granted to share info w Relationship: Son  Permission granted to share info w Contact Information: [phone]  Emotional Assessment Appearance:: Appears stated age Attitude/Demeanor/Rapport:  Engaged Affect (typically observed): Appropriate Orientation: : Oriented to Self, Oriented to Place, Oriented to  Time, Oriented to Situation Alcohol / Substance Use: Not Applicable Psych Involvement: No (comment)  Admission diagnosis:  CHF (congestive heart  failure) (HCC) [I50.9] General weakness [R53.1] Elevated troponin [R77.8] Dyspnea, unspecified type [R06.00] Patient Active Problem List   Diagnosis Date Noted  . Aortic stenosis 08/22/2019  . Acute diastolic (congestive) heart failure ([place]) 08/21/2019  . Acute on chronic diastolic CHF (congestive heart failure) ([place])   . General weakness   . AMS (altered mental status) 08/01/2019  . Acute lower UTI 08/01/2019  . Agitation 07/24/2019  . Tumor lysis syndrome   . Hyperuricemia   . Thrombocytopenia ([place])   . Myeloproliferative disorder ([place]) 07/10/2019  . AKI (acute kidney injury) ([place]) 07/10/2019  . AF (paroxysmal atrial fibrillation) ([place]) 06/02/2017  . Leukocytosis 05/29/2017  . Anemia 05/29/2017  . Leg swelling 02/23/2016  . Hand cramps 11/22/2015  . GERD (gastroesophageal reflux disease) 02/09/2013  . OA (osteoarthritis) of knee 01/29/2012  . OA (osteoarthritis of spine) 10/18/2011  . Healthcare maintenance 04/25/2011  . Hypokalemia 02/26/2008  . Moderate persistent asthma 02/10/2008  . Hyperlipidemia 08/04/2007  . H/O Herpes zoster 08/29/2006  . Obesity, Class III, BMI 40-49.9 (morbid obesity) ([place]) 08/29/2006  . History of breast cyst 08/29/2006  . Essential hypertension 08/27/2006  . 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   PCP:  [name], MD Pharmacy:   [facility] [place], [address] [place] [address] Phone: [phone] Fax: [phone]  [id number] - [place], [place] [address] [place] [address] Phone: [phone] Fax: [phone]     Social Determinants of Health ([place]) Interventions    Readmission Risk Interventions Readmission Risk Prevention Plan 08/04/2019   Transportation Screening Complete  PCP or Specialist Appt within 3-5 Days Complete  HRI or [place] Complete  Social Work Consult for [place] Planning/Counseling Complete  Palliative Care Screening Not Applicable  Medication Review Press photographer) Complete  Some recent data might be hidden

## 2019-08-26 LAB — BASIC METABOLIC PANEL
Anion gap: 12 (ref 5–15)
BUN: 25 mg/dL — ABNORMAL HIGH (ref 8–23)
CO2: 29 mmol/L (ref 22–32)
Calcium: 9.2 mg/dL (ref 8.9–10.3)
Chloride: 94 mmol/L — ABNORMAL LOW (ref 98–111)
Creatinine, Ser: 1.15 mg/dL — ABNORMAL HIGH (ref 0.44–1.00)
GFR calc Af Amer: 54 mL/min — ABNORMAL LOW (ref >60)
GFR calc non Af Amer: 46 mL/min — ABNORMAL LOW (ref >60)
Glucose, Bld: 71 mg/dL (ref 70–99)
Potassium: 4.1 mmol/L (ref 3.5–5.1)
Sodium: 135 mmol/L (ref 135–145)

## 2019-08-26 MED ORDER — FUROSEMIDE 40 MG PO TABS: 40.0000 mg | ORAL_TABLET | Freq: Every day | ORAL | 0 refills | Status: DC

## 2019-08-26 MED FILL — FUROSEMIDE 40 MG TABLET: 40 | 30 days supply | Qty: 30 | Fill #0

## 2019-08-26 NOTE — Progress Notes (Signed)
Occupational Therapy Treatment Patient Details Name: Erika [name] MRN: [id number] DOB: 01/24/1941 Today's Date: 08/26/2019    History of present illness Erika [name] is a 79 y.o. female with hx of HTN, remote SVT, paroxysmal atrial flutter on Eliquis, myeloproliferative neoplasm NOS (Followed by Dr. Irene Limbo), obesity, chronic neurophils, chronic back pain s/p laminectomy and  DDD presents weakness, inability to care for self, for the evaluation of CHF and elevated troponin. Discharged home one day from SNF.   OT comments  Pt making gradual progress towards OT goals this session. Session focus on functional mobility and transfer training as pt and family declined SNF d/t financial concerns with pt now planning to DC home with [facility]. Pt unable to stand and pivot feet needing MAX A +2 for lateral scoot from EOB<>recliner. Assisted pt with toilet transfer with stedy with pt still needing MAX +2 assist for pericare and sit>stand from EOB and BSC.  Highly recommend w/c for DC as pt would need it for DR apts and functional mobility within the home. Updated DCs to DME listed below as pt is currently unsafe to return home at current level of function. Will let OTR know about change in POC.    Follow Up Recommendations  SNF;Supervision/Assistance - 24 hour;Other (comment)(if pt refuses SNF, will need equipment for home)    Equipment Recommendations  3 in 1 bedside commode;Other (comment);Wheelchair (measurements OT);Wheelchair cushion (measurements OT);Hospital bed(bari bilateral drop arm 3in1; hoyer lift with pads if pt chooses d/c home)    Recommendations for Other Services      Precautions / Restrictions Precautions Precautions: Fall Restrictions Weight Bearing Restrictions: No       Mobility Bed Mobility               General bed mobility comments: sitting EOB upon OT arrival  Transfers Overall transfer level: Needs assistance Equipment used: Ambulation equipment used Transfers: Sit  to/from [facility];Lateral/Scoot Transfers Sit to Stand: Mod assist;+2 physical assistance;From elevated surface        Lateral/Scoot Transfers: Max assist;+2 physical assistance General transfer comment: pt completed stand pivot transfer from EOB>BSC via stedy with MOD A +2 for sit>stand from EOB. Pt able to stand from stedy with min guard. MAX A +2 for lateral scoot from EOB<>recliner needing cues for body mechanics as pt with difficulty shifting weight anteriorly d/t body habitus.    Balance Overall balance assessment: Needs assistance Sitting-balance support: Feet supported;No upper extremity supported Sitting balance-Leahy Scale: Good     Standing balance support: Bilateral upper extremity supported Standing balance-Leahy Scale: Poor Standing balance comment: reliant on BUE support in stedy                           ADL either performed or assessed with clinical judgement   ADL Overall ADL's : Needs assistance/impaired     Grooming: Wash/dry hands;Sitting;Set up       Lower Body Bathing: +2 for physical assistance;Sit to/from stand;Moderate assistance Lower Body Bathing Details (indicate cue type and reason): pt required MOD A +2 for sit>stand to complete posterior pericare, pt able to complete pericare with set- up but would require [place] for cleanliness         Toilet Transfer: Maximal assistance;+2 for physical assistance;Requires wide/bariatric Toilet Transfer Details (indicate cue type and reason): STEDY heavy maxA using bed pad to stand from elevated position Toileting- Clothing Manipulation and Hygiene: Moderate assistance;Sit to/from stand Toileting - Water quality scientist Details (indicate  cue type and reason): pt able to complete pericare in standing with set- up assist but noted to lean heavily on stedy and would require MAX A for cleanliness   Tub/Shower Transfer Details (indicate cue type and reason): verbally demo'ed use of 3n1 in  as tub shower seat Functional mobility during ADLs: Maximal assistance;+2 for physical assistance;+2 for safety/equipment General ADL Comments: Pt limited by pain in B/L knees, decreased strength, decreased mobility and decreased acitivty tolerance for functional tasks. session focus on functional mobility/ transfers from EOB<> recliner via lateral scoot needing MAX A +2     Vision Baseline Vision/History: No visual deficits Patient Visual Report: No change from baseline Vision Assessment?: No apparent visual deficits   Perception     Praxis      Cognition Arousal/Alertness: Awake/alert Behavior During Therapy: WFL for tasks assessed/performed Overall Cognitive Status: Within Functional Limits for tasks assessed                                          Exercises     Shoulder Instructions       General Comments VSS on RA; Issued pt gait belt to assist with safe transfers at home.     Pertinent Vitals/ Pain       Pain Assessment: No/denies pain  Home Living                                          Prior Functioning/Environment              Frequency  Min 2X/week        Progress Toward Goals  OT Goals(current goals can now be found in the care plan section)  Progress towards OT goals: Progressing toward goals  Acute Rehab OT Goals Patient Stated Goal: to go home OT Goal Formulation: With patient Time For Goal Achievement: 09/06/19 Potential to Achieve Goals: Good  Plan Discharge plan needs to be updated    Co-evaluation                 AM-PAC OT "6 Clicks" Daily Activity     Outcome Measure   Help from another person eating meals?: None Help from another person taking care of personal grooming?: A Little Help from another person toileting, which includes using toliet, bedpan, or urinal?: A Lot Help from another person bathing (including washing, rinsing, drying)?: A Lot Help from another person to put on and  taking off regular upper body clothing?: A Lot Help from another person to put on and taking off regular lower body clothing?: A Lot 6 Click Score: 15    End of Session Equipment Utilized During Treatment: Gait belt;Other (comment)(BSC)  OT Visit Diagnosis: Unsteadiness on feet (R26.81);Muscle weakness (generalized) (M62.81);Pain Pain - part of body: Knee   Activity Tolerance Patient tolerated treatment well   Patient Left in bed;with call bell/phone within reach;Other (comment)(sitting EOB)   Nurse Communication          [id number] OT Time Calculation (min): 34 min  Charges: OT General Charges $OT Visit: 1 Visit OT Treatments $Self Care/Home Management : 23-37 mins  Erika Clam., Erika [name] Acute Rehabilitation Services [phone]    Erika [name] 08/26/2019, 12:33 PM

## 2019-08-26 NOTE — TOC Progression Note (Addendum)
Transition of Care [facility]) - Progression Note    Patient Details  Name: Erika [name] MRN: [id number] Date of Birth: 11/13/1940  Transition of [facility]) CM/SW Contact  Zenon Mayo, RN Phone Number: 08/26/2019, 5:00 PM  Clinical Narrative:    [facility] will be delivering the  3 n 1, walker , w/chair  And hoyer lift to patient's home.  NCM spoke with son Gerald Stabs, he states patient can not come home without a hospital bed.  [facility] has some hospital beds, NCM faxed orders over to them , they plan to deliver the hospital bed on 1/20.  NCM informed son , Gerald Stabs of this information, NCM offered choice to Gerald Stabs for [facility] services, he states he does not have a preference.  NCM made referral to [facility] with [facility] for [place], [place], [place].  Tommi Rumps states he can take referral.  Soc will begin 24 to 48 hrs post dc.  NCM found out patient is active with [facility] will notify Cassie that she is being dc today 1/21.    Expected Discharge Plan: [place] Barriers to Discharge: Equipment Delay  Expected Discharge Plan and Services Expected Discharge Plan: [place]   Discharge Planning Services: CM Consult Post Acute Care Choice: Durable Medical Equipment, Home Health Living arrangements for the past 2 months: Apartment Expected Discharge Date: 08/26/19               DME Arranged: 3-N-1, Gilford Rile rolling, Wheelchair manual, Hospital bed(hoyer lift) DME Agency: [facility]) Date DME Agency Contacted: 08/26/19 Time DME Agency Contacted: [phone] Representative spoke with at DME Agency: Edwyna Ready  ([facility]), rep with [facility] HH Arranged: RN, Disease Management, PT, OT, Nurse's Aide [place] Agency: [place] Date [facility] Agency Contacted: 08/26/19 Time [place]: [phone] Representative spoke with at [place]: [place] ([place]) Interventions    Readmission Risk Interventions Readmission Risk Prevention Plan 08/26/2019  08/04/2019  Transportation Screening Complete Complete  PCP or Specialist Appt within 3-5 Days - Complete  HRI or [place] - Complete  Social Work Consult for [place] Planning/Counseling - Complete  Palliative Care Screening - Not Applicable  Medication Review Press photographer) Complete Complete  HRI or Home Care Consult Complete -  SW Recovery Care/Counseling Consult Complete -  Palliative Care Screening Not Applicable -  [place] Not Applicable -  Some recent data might be hidden

## 2019-08-26 NOTE — TOC Progression Note (Addendum)
Transition of Care [facility]) - Progression Note    Patient Details  Name: TRUDIE TIMM MRN: [id number] Date of Birth: [birth year]  Transition of Care [facility]) CM/SW [place], [place] Phone Number: [phone] 08/26/2019, 12:27 PM  Clinical Narrative:     CSW received 9 calls from [place], patient's son at [phone]. CSW called him back he reports he is concerned about patient's discharge home and that she needs a hospital bed . Chris requested [employer]. He states he is at work today and has family coming after work to help arrange the home for hospital bed to be delivered.   TOC team will work on ordering hospital bed to be delivered to home for discharge tomorrow, MD made aware and requested narrative for hospital bed order. Pending at this time.   Expected Discharge Plan: [place] Barriers to Discharge: Equipment Delay  Expected Discharge Plan and Services Expected Discharge Plan: [place], Durable Medical Equipment Living arrangements for the past 2 months: Apartment Expected Discharge Date: 08/26/19               DME Arranged: Wheelchair manual, Gilford Rile, 3-N-1 DME Agency: [facility]       HH Arranged: PT, OT           Social Determinants of Health (SDOH) Interventions    Readmission Risk Interventions Readmission Risk Prevention Plan 08/04/2019  Transportation Screening Complete  PCP or Specialist Appt within 3-5 Days Complete  HRI or [employer] Complete  Social Work Consult for [place] Planning/Counseling Complete  Palliative Care Screening Not Applicable  Medication Review Press photographer) Complete  Some recent data might be hidden

## 2019-08-26 NOTE — TOC Progression Note (Signed)
Transition of Care [facility]) - Progression Note    Patient Details  Name: Erika [name] MRN: [id number] Date of Birth: 1941-05-08  Transition of Care [facility]) CM/SW Contact  Zenon Mayo, RN Phone Number: 08/26/2019, 4:10 PM  Clinical Narrative:    [facility] will be delivering the  3 n 1, walker , w/chair  And hoyer lift to patient's home.  NCM spoke with son Gerald Stabs, he states patient can not come home without a hospital bed. [facility] does not have any more hospital beds, [facility] does not carry hospital beds.  NCM contacted [employer] insurance asked for help to locate hospital bed they gave in network to NCM as [place]  In home patient, [facility], [facility], and [facility].  [facility] does not have any more hospital beds. , [facility] is for women with breast cancer.  NCM received call from Amy with [employer] she states [place] has beds. [phone] and fax [phone]. NCM faxed information to [facility] , they plan to deliver hospital bed to patient tomorrow,  rep states [facility] [phone].     Expected Discharge Plan: [place] Barriers to Discharge: Equipment Delay  Expected Discharge Plan and Services Expected Discharge Plan: [place], Durable Medical Equipment Living arrangements for the past 2 months: Apartment Expected Discharge Date: 08/26/19               DME Arranged: Wheelchair manual, Gilford Rile, 3-N-1 DME Agency: [facility]       HH Arranged: PT, OT           Social Determinants of Health (SDOH) Interventions    Readmission Risk Interventions Readmission Risk Prevention Plan 08/04/2019  Transportation Screening Complete  PCP or Specialist Appt within 3-5 Days Complete  HRI or [place] Complete  Social Work Consult for [place] Planning/Counseling Complete  Palliative Care Screening Not Applicable  Medication Review Human resources officer) Complete  Some recent data might be hidden

## 2019-08-26 NOTE — Discharge Summary (Signed)
Name: Erika [name] MRN: [id number] DOB: 05-18-1941 79 y.o. PCP: [name], MD  Date of Admission: 08/21/2019  4:45 AM Date of Discharge: 08/27/2019 Attending Physician: Axel Filler, *  Discharge Diagnosis: Principal Problem:   Acute on chronic diastolic CHF (congestive heart failure) ([place]) Active Problems:   Obesity, Class III, BMI 40-49.9 (morbid obesity) ([place])   Essential hypertension   Leukocytosis   Myeloproliferative disorder ([place])   Aortic stenosis  Discharge Medications: Allergies as of 08/27/2019      Reactions   Prednisone Other (See Comments)   REACTION: Psychosis, talking out of head, insomnia      Medication List    STOP taking these medications   diphenhydrAMINE 25 MG tablet Commonly known as: BENADRYL   tiZANidine 4 MG tablet Commonly known as: ZANAFLEX     TAKE these medications   Advair Diskus 500-50 MCG/DOSE Aepb Generic drug: Fluticasone-Salmeterol INHALE 1 PUFF BY MOUTH TWICE DAILY What changed:   how much to take  how to take this  when to take this  additional instructions   albuterol 108 (90 Base) MCG/ACT inhaler Commonly known as: VENTOLIN HFA INHALE 1 TO 2 PUFFS BY MOUTH EVERY 6 HOURS AS NEEDED What changed: See the new instructions.   allopurinol 300 MG tablet Commonly known as: ZYLOPRIM Take 0.5 tablets (150 mg total) by mouth 2 (two) times daily.   amLODipine 10 MG tablet Commonly known as: NORVASC Take 1 tablet (10 mg total) by mouth daily. What changed: how much to take   apixaban 5 MG Tabs tablet Commonly known as: Eliquis Take 1 tablet (5 mg total) by mouth 2 (two) times daily.   aspirin EC 81 MG tablet Take 81 mg by mouth daily.   Ensure Max Protein Liqd Take 330 mLs (11 oz total) by mouth 2 (two) times daily.   feeding supplement (PRO-STAT SUGAR FREE 64) Liqd Take 30 mLs by mouth daily.   fluticasone 50 MCG/ACT nasal spray Commonly known as: FLONASE SHAKE LIQUID AND USE 1 SPRAY IN EACH  NOSTRIL DAILY What changed: See the new instructions.   furosemide 40 MG tablet Commonly known as: LASIX Take 1 tablet (40 mg total) by mouth daily. What changed:   medication strength  See the new instructions.   metoprolol tartrate 50 MG tablet Commonly known as: LOPRESSOR Take 1 tablet (50 mg total) by mouth 2 (two) times daily.   nystatin powder Commonly known as: MYCOSTATIN/NYSTOP Apply topically 2 (two) times daily.   omeprazole 20 MG capsule Commonly known as: PRILOSEC TAKE 1 CAPSULE BY MOUTH EVERY DAY What changed: how much to take   pravastatin 20 MG tablet Commonly known as: PRAVACHOL Take 1 tablet (20 mg total) by mouth daily.   QUEtiapine 25 MG tablet Commonly known as: SEROQUEL Take 1 tablet (25 mg total) by mouth at bedtime. What changed:   how much to take  Another medication with the same name was removed. Continue taking this medication, and follow the directions you see here.   sodium chloride 0.65 % Soln nasal spray Commonly known as: OCEAN Place 1 spray into both nostrils as needed for congestion.            Durable Medical Equipment  (From admission, onward)         Start     Ordered   08/26/19 1227  For home use only DME Hospital bed  Once    Question Answer Comment  Length of Need Lifetime   Patient  has (list medical condition): CHF   The above medical condition requires: Patient requires the ability to reposition frequently   Head must be elevated greater than: 30 degrees   Bed type Semi-electric   Trapeze Bar Yes   Support Surface: Gel Overlay      08/26/19 1227   08/26/19 1151  For home use only DME Walker rolling  Once    Question Answer Comment  Walker: With [place]   Patient needs a walker to treat with the following condition Weakness      08/26/19 1150   08/26/19 1149  DME 3-in-1  Once    Comments: Drop arm   08/26/19 1149   08/26/19 1148  DME standard manual wheelchair with seat cushion  Once    Comments:  Patient suffers from congestive heart failure which impairs their ability to perform daily activities like bathing, dressing and grooming in the home.  A walker will not resolve issue with performing activities of daily living. A wheelchair will allow patient to safely perform daily activities. Patient can safely propel the wheelchair in the home or has a caregiver who can provide assistance. Length of need Lifetime. Accessories: elevating leg rests (ELRs), wheel locks, extensions and anti-tippers. HD   08/26/19 1148   08/26/19 1144  For home use only DME Other see comment  Once    Comments: Harrel Lemon lift  Question:  Length of Need  Answer:  Lifetime   08/26/19 1143   08/26/19 0748  DME Walker  Once    Question Answer Comment  Walker: With [place] Wheels   Patient needs a walker to treat with the following condition Heart failure ([place])      08/26/19 0749          Disposition and follow-up:   Ms.Kalene H Hagan was discharged from [facility] in Stable condition.  At the hospital follow up visit please address:  1.  Acute on chronic HFpEF - discharged on 40mg  PO lasix daily - wt at discharge 122 kg - will need follow-up with Dr. Martinique, cardiologist, for moderate aortic stenosis seen on echo  Physical deconditioning - unfortunately pt used SNF days, so [facility] services were maximized for discharge home - ensure safety and functioning at home with home health therapies and DME - continue surveillance of med list to decrease risk of confusion/falls at home  - decreased Seroquel dose at bedtime and discontinued benadryl and muscle relaxant at discharge  2.  Labs / imaging needed at time of follow-up: NONE  3.  Pending labs/ test needing follow-up: NONE  Follow-up Appointments: Follow-up Information    [facility] Follow up.   Why: 3 n 1, rolling walker, w/chair , hoyer lift Contact information: [address] [place] [address] [phone]         [facility] Follow up.   Why: hospital bed Contact information: [address] [place] [address] [phone]        [facility] Follow up.   Specialty: Home Health Services Why: [place] , [place], [place], [place] Contact information: [address] [place] [address] [phone]           Hospital Course by problem list: 1. Ms. Viscusi presented with generalized weakness and signs of volume overload on exam with 2+ LE pitting edema and crackles on auscultation. BNP elevated to 873. Troponin trend 176 >> 50. Cardiology was consulted for assistance in diuresis and began IV lasix 40mg   BID. Repeat echo with LVEF 55-60% and moderate aortic stenosis (mean gradient 56mmHg). Pt diuresed well and was overall net negative 6L and wt down 7kg, with discharge wt 122kg. She was transitioned to 40mg  PO lasix on 1/18 to be continued at discharge.  Pt was evaluated by PT/OT who recommended SNF placement. Unfortunately, pt had just left a SNF facility for presentation to the hospital, and therefore did not have any SNF days left per her insurance. She was not a candidate for CIR therapy. Discharge was coordinated with pt's son for maximized home health and DME needs. At discharge, decreased Seroquel dose at bedtime to 25mg  and discontinued benadryl and muscle relaxant to decrease burden of centrally acting meds.  Discharge Vitals:   BP (!) 133/57 (BP Location: Right Arm)   Pulse 88   Temp 98 F (36.7 C) (Oral)   Resp 16   Ht 5\' 7"  (1.702 m)   Wt 122 kg   SpO2 98%   BMI 42.13 kg/m   Pertinent Labs, Studies, and Procedures:  CBC Latest Ref Rng & Units 08/23/2019 08/21/2019 08/04/2019  WBC 4.0 - 10.5 K/uL 99.7(HH) 108.4(HH) 103.1(HH)  Hemoglobin 12.0 - 15.0 g/dL 8.0(L) 8.2(L) 9.0(L)  Hematocrit 36.0 - 46.0 % 24.9(L) 25.9(L) 29.0(L)  Platelets 150 - 400 K/uL 193 177 154   BMP Latest Ref Rng & Units 08/26/2019 08/25/2019 08/24/2019  Glucose 70 - 99 mg/dL 71 51(L) <20(LL)  BUN 8  - 23 mg/dL 25(H) 24(H) 26(H)  Creatinine 0.44 - 1.00 mg/dL 1.15(H) 1.21(H) 1.23(H)  BUN/Creat Ratio 12 - 28 - - -  Sodium 135 - 145 mmol/L 135 138 137  Potassium 3.5 - 5.1 mmol/L 4.1 4.1 3.3(L)  Chloride 98 - 111 mmol/L 94(L) 97(L) 92(L)  CO2 22 - 32 mmol/L 29 31 30   Calcium 8.9 - 10.3 mg/dL 9.2 9.2 9.1   BNP    Component Value Date/Time   BNP 873.6 (H) 08/21/2019 0609    CXR 08/21/2019 CLINICAL DATA:  Shortness of breath  EXAM: PORTABLE CHEST 1 VIEW  COMPARISON:  07/23/2019  FINDINGS: Cardiomegaly and generalized interstitial coarsening which appears similar to before. No discrete Kerley lines. Pulmonary arteries are enlarged at the hila. No effusion or pneumothorax.  IMPRESSION: Cardiomegaly and vascular congestion. Chronic pulmonary artery enlargement suggesting pulmonary hypertension.   ECHO 08/21/2019 IMPRESSIONS  1. Left ventricular ejection fraction, by visual estimation, is 55 to 60%. The left ventricle has normal function. There is mildly increased left ventricular hypertrophy.  2. The aortic valve is abnormal. Moderate calcifications. Aortic valve regurgitation is not visualized. Moderate aortic valve stenosis (Vmax 3.0 m/s, MG 22 mmHg, AVA 1.4 cm^2, DI 0.45)  3. Global right ventricle has normal systolic function.The right ventricular size is mildly enlarged.  4. Left atrial size was moderately dilated.  5. Right atrial size was mildly dilated.  6. The mitral valve is normal in structure. Trivial mitral valve regurgitation.  7. The tricuspid valve is normal in structure.  8. The pulmonic valve was not well visualized. Pulmonic valve regurgitation is not visualized.  9. The tricuspid regurgitant velocity is 3.28 m/s, and with an assumed right atrial pressure of 3 mmHg, the estimated right ventricular systolic pressure is moderately elevated at 46.0 mmHg. 10. The inferior vena cava is normal in size with greater than 50% respiratory variability, suggesting  right atrial pressure of 3 mmHg.   Discharge Instructions: Discharge Instructions    Call MD for:  difficulty breathing, headache or visual disturbances   Complete by:  As directed    Call MD for:  extreme fatigue   Complete by: As directed    Call MD for:  hives   Complete by: As directed    Call MD for:  persistant dizziness or light-headedness   Complete by: As directed    Call MD for:  persistant nausea and vomiting   Complete by: As directed    Call MD for:  redness, tenderness, or signs of infection (pain, swelling, redness, odor or green/yellow discharge around incision site)   Complete by: As directed    Call MD for:  severe uncontrolled pain   Complete by: As directed    Call MD for:  temperature >100.4   Complete by: As directed    Diet - low sodium heart healthy   Complete by: As directed    Discharge instructions   Complete by: As directed    Ms. Stocks,  You were seen in the hospital after feeling weak at home, which was found to be related to some extra fluid on your body. This was treated with IV and oral lasix that allowed you to urinate off the excess fluid. Please begin taking 40mg  oral lasix everyday. Follow-up with your cardiologist, Dr. Martinique, for your congestive heart failure and heart murmur (related to aortic stenosis).  Additionally, home health services with physical therapy have been arranges to help you with your mobility at home. Please stop taking Benadryl and tizanidine at home as these can make you confused and worsen your weakness. Follow-up with Dr. Daryll Drown, to continue monitoring how you are doing at home.  Thank you for letting us be a part of your care!   Increase activity slowly   Complete by: As directed       Signed: Ladona Horns, MD 08/27/2019, 8:45 AM   Pager: [phone]

## 2019-08-26 NOTE — Progress Notes (Cosign Needed)
    Durable Medical Equipment  (From admission, onward)         Start     Ordered   08/26/19 1151  For home use only DME Walker rolling  Once    Question Answer Comment  Walker: With [place] Wheels   Patient needs a walker to treat with the following condition Weakness      08/26/19 1150   08/26/19 1149  DME 3-in-1  Once    Comments: Drop arm   08/26/19 1149   08/26/19 1148  DME standard manual wheelchair with seat cushion  Once    Comments: Patient suffers from congestive heart failure which impairs their ability to perform daily activities like bathing, dressing and grooming in the home.  A walker will not resolve issue with performing activities of daily living. A wheelchair will allow patient to safely perform daily activities. Patient can safely propel the wheelchair in the home or has a caregiver who can provide assistance. Length of need Lifetime. Accessories: elevating leg rests (ELRs), wheel locks, extensions and anti-tippers. HD   08/26/19 1148   08/26/19 1144  For home use only DME Other see comment  Once    Comments: Harrel Lemon lift  Question:  Length of Need  Answer:  Lifetime   08/26/19 1143   08/26/19 0748  DME Walker  Once    Question Answer Comment  Walker: With 5 Inch Wheels   Patient needs a walker to treat with the following condition Heart failure ([place])      08/26/19 [id number]

## 2019-08-26 NOTE — Progress Notes (Signed)
    Durable Medical Equipment  (From admission, onward)         Start     Ordered   08/26/19 1227  For home use only DME Hospital bed  Once    Question Answer Comment  Length of Need Lifetime   Patient has (list medical condition): CHF   The above medical condition requires: Patient requires the ability to reposition frequently   Head must be elevated greater than: 30 degrees   Bed type Semi-electric   Trapeze Bar Yes   Support Surface: Gel Overlay      08/26/19 1227   08/26/19 1151  For home use only DME Walker rolling  Once    Question Answer Comment  Walker: With [place]   Patient needs a walker to treat with the following condition Weakness      08/26/19 1150   08/26/19 1149  DME 3-in-1  Once    Comments: Drop arm   08/26/19 1149   08/26/19 1148  DME standard manual wheelchair with seat cushion  Once    Comments: Patient suffers from congestive heart failure which impairs their ability to perform daily activities like bathing, dressing and grooming in the home.  A walker will not resolve issue with performing activities of daily living. A wheelchair will allow patient to safely perform daily activities. Patient can safely propel the wheelchair in the home or has a caregiver who can provide assistance. Length of need Lifetime. Accessories: elevating leg rests (ELRs), wheel locks, extensions and anti-tippers. HD   08/26/19 1148   08/26/19 1144  For home use only DME Other see comment  Once    Comments: Harrel Lemon lift  Question:  Length of Need  Answer:  Lifetime   08/26/19 1143   08/26/19 0748  DME Walker  Once    Question Answer Comment  Walker: With 5 Inch Wheels   Patient needs a walker to treat with the following condition Heart failure ([place])      08/26/19 [id number]

## 2019-08-26 NOTE — Progress Notes (Signed)
   Subjective: Ms. Turro was seen and evaluated at bedside. Discussed difficult circumstances with her rehab days being used up. She plans to go home with her son, but he is somewhat disabled himself with a recent stroke. We plan to maximize her home health services based on physical therapy's recommendations.  She denies any shortness of breath, chest pain or swelling in her legs.   Objective:  Vital signs in last 24 hours: Vitals:   08/25/19 1949 08/25/19 2037 08/26/19 0401 08/26/19 0405  BP:  (!) 124/51 (!) 100/52   Pulse:  88 82   Resp:  18 18   Temp:  98.7 F (37.1 C) 98.1 F (36.7 C)   TempSrc:  Oral Oral   SpO2: 96% 97% 93%   Weight:    124.9 kg  Height:       General: awake, alert, pleasant female lying in bed in NAD Neck: no JVD CV: RRR; SEM Pulm: normal work of breathing on room air; lungs CTAB Ext: trace pitting edema in BLE  Assessment/Plan:  Principal Problem:   Acute on chronic diastolic CHF (congestive heart failure) ([facility]) Active Problems:   Obesity, Class III, BMI 40-49.9 (morbid obesity) ([facility])   Essential hypertension   Leukocytosis   Myeloproliferative disorder ([facility])   Aortic stenosis  Physical deconditioning Per social work, pt has used SNF days and is not able to pay for a facility personally. CIR has declined her case due to lack of medical complexity. Discharge at this time is planned for tomorrow with maximized [facility] and DME, orders placed. - greatly appreciate therapy and the transitions of care team's help in this difficult scenario   Acute on chronic HFpEF Clinically stable and euvolemic. - net negative 86cc yesterday, with weight down 1.2kg - over course of admission, net negative 5.8L - continue PO lasix 40mg  daily - labs (K and Cr) stable - Cardiology consulted, outpatient follow-up for aortic stenosis, Dr. Martinique at [facility] - strict I&Os, daily weights  - continue Lopressor  PAF - rate controlled on Lopressor - continue Eliquis   Diet - heart healthy Fluids - none DVT ppx - apixaban 5mg  BID CODE STATUS - FULL CODE  Dispo: Anticipated discharge tomorrow  Ladona Horns, MD 08/26/2019, 7:39 AM Pager: [phone]

## 2019-08-27 DIAGNOSIS — Z888 Allergy status to other drugs, medicaments and biological substances status: Secondary | ICD-10-CM

## 2019-08-27 NOTE — Progress Notes (Signed)
Physical Therapy Treatment Patient Details Name: Erika [name] MRN: [id number] DOB: [birth year] Today's Date: 08/27/2019    History of Present Illness Erika [name] is a 79 y.o. female with hx of HTN, remote SVT, paroxysmal atrial flutter on Eliquis, myeloproliferative neoplasm NOS (Followed by Dr. Irene Limbo), obesity, chronic neurophils, chronic back pain s/p laminectomy and  DDD presents weakness, inability to care for self, for the evaluation of CHF and elevated troponin. Discharged home one day from SNF.    PT Comments    Patient seen for mobility progression. This session focused on functional transfer training. Pt continues to require min/mod A using Stedy standing frame for safe OOB transfer to [facility]. Attempted to stand with RW from elevated bed height however pt unable to power up with assistance.  Continue to recommend post acute rehab however plan is for pt to d/c home. Pt will need maximized [facility] services.    Follow Up Recommendations  Supervision for mobility/OOB;Supervision/Assistance - 24 hour;CIR     Equipment Recommendations  Drop arm 3in1 (PT)    Recommendations for Other Services OT consult     Precautions / Restrictions Precautions Precautions: Fall Restrictions Weight Bearing Restrictions: No    Mobility  Bed Mobility Overal bed mobility: Needs Assistance Bed Mobility: Supine to Sit;Sit to Supine     Supine to sit: HOB elevated;Supervision Sit to supine: Supervision   General bed mobility comments: supervision for safety  Transfers Overall transfer level: Needs assistance Equipment used: Ambulation equipment used;Rolling walker (2 wheeled) Transfers: Sit to/from Stand;Stand Pivot Transfers Sit to Stand: Min assist;Mod assist         General transfer comment: initially attempted to stand from EOB with RW however pt unable to stand despite assistance; Stedy standing frame used for sit to stand X 3; mod A to stand from EOB with standing frame and min A for  stedy seat  Ambulation/Gait                 Stairs             Wheelchair Mobility    Modified Rankin (Stroke Patients Only)       Balance Overall balance assessment: Needs assistance Sitting-balance support: Feet supported;No upper extremity supported Sitting balance-Leahy Scale: Good     Standing balance support: Bilateral upper extremity supported Standing balance-Leahy Scale: Poor Standing balance comment: pt unable to stand fully upright with standing frame and tends to lean on forearms                            Cognition Arousal/Alertness: Awake/alert Behavior During Therapy: WFL for tasks assessed/performed Overall Cognitive Status: Within Functional Limits for tasks assessed                                        Exercises      General Comments        Pertinent Vitals/Pain Pain Assessment: No/denies pain    Home Living                      Prior Function            PT Goals (current goals can now be found in the care plan section) Acute Rehab PT Goals Patient Stated Goal: to go home Progress towards PT goals: Progressing toward goals    Frequency  Min 3X/week      PT Plan Current plan remains appropriate    Co-evaluation              AM-PAC PT "6 Clicks" Mobility   Outcome Measure  Help needed turning from your back to your side while in a flat bed without using bedrails?: None Help needed moving from lying on your back to sitting on the side of a flat bed without using bedrails?: A Little Help needed moving to and from a bed to a chair (including a wheelchair)?: Total Help needed standing up from a chair using your arms (e.g., wheelchair or bedside chair)?: A Lot Help needed to walk in hospital room?: Total Help needed climbing 3-5 steps with a railing? : Total 6 Click Score: 12    End of Session Equipment Utilized During Treatment: Gait belt Activity Tolerance: Patient  tolerated treatment well Patient left: in chair;with call bell/phone within reach;with chair alarm set Nurse Communication: Mobility status(stedy) PT Visit Diagnosis: Muscle weakness (generalized) (M62.81);Difficulty in walking, not elsewhere classified (R26.2)     Time: 1430-1500 PT Time Calculation (min) (ACUTE ONLY): 30 min  Charges:  $Gait Training: 8-22 mins $Therapeutic Activity: 8-22 mins                     Earney Navy, PTA Acute Rehabilitation Services Pager: [phone] Office: [phone]     Darliss Cheney 08/27/2019, 5:20 PM

## 2019-08-27 NOTE — Progress Notes (Signed)
   Subjective: Pt seen at the bedside this morning. She is feeling well, and is anticipating going home today after delivery of the hospital bed to her apartment. Has no acute concerns at this time.  Objective:  Vital signs in last 24 hours: Vitals:   08/26/19 1103 08/26/19 1953 08/26/19 2056 08/27/19 0614  BP: (!) 102/54  (!) 133/57   Pulse: 84  89   Resp: 19  19   Temp: 97.7 F (36.5 C)  98 F (36.7 C)   TempSrc: Oral  Oral   SpO2: 98% 96% 97%   Weight:    122 kg  Height:       Physical Exam Vitals reviewed.  Constitutional:      General: She is not in acute distress.    Appearance: She is not ill-appearing.  Pulmonary:     Effort: Pulmonary effort is normal.  Musculoskeletal:     Comments: Trace bilateral pitting edema  Skin:    General: Skin is warm and dry.  Neurological:     Mental Status: She is alert.  Psychiatric:        Mood and Affect: Mood normal.    Assessment/Plan:  Principal Problem:   Acute on chronic diastolic CHF (congestive heart failure) (H[facility]) Active Problems:   Obesity, Class III, BMI 40-49.9 (morbid obesity) ([place])   Essential hypertension   Leukocytosis   Myeloproliferative disorder ([place])   Aortic stenosis  Physical deconditioning Per social work, pt has used SNF days and is not able to pay for a facility personally. Discharge planned with maximized HH and DME, orders placed. - greatly appreciate therapy and the transitions of care team's help in this difficult scenario  - discharge today after delivery of hospital bed  Acute on chronic HFpEF Clinicallystableand euvolemic. - net positive 155cc yesterday, with weight up 2.9kg - continue PO lasix 40mg  daily - Cardiology consulted, outpatient follow-up for aortic stenosis, Dr. Martinique at [facility] - strict I&Os, daily weights  - continue Lopressor  PAF - rate controlled on Lopressor - continue Eliquis  Diet - heart healthy Fluids - none DVT ppx - apixaban 5mg  BID CODE STATUS  - FULL CODE  Dispo: Anticipated discharge today  Ladona Horns, MD 08/27/2019, 6:44 AM Pager: [phone]

## 2019-08-27 NOTE — TOC Transition Note (Addendum)
Transition of Care [facility]) - CM/SW Discharge Note   Patient Details  Name: Erika [name] MRN: [id number] Date of Birth: 1941/07/15  Transition of Care [facility]) CM/SW Contact:  Zenon Mayo, RN Phone Number: 08/27/2019, 10:45 AM   Clinical Narrative:    Patient is for dc today, NCM spoke with Jeneen Rinks with [facility], he states they will have hospital bed delivered today.  NCM gave him son's phone number to coordinate bed delivery.  NCM found out patient is active with [facility] , lvm to notify Cassie with [facility] that patient is for dc today and needs HHRN , [place], HHOT, HHAIDE.  Awaiting call back from [facility] and call back from [place] to let NCM know bed has been delivered before patient is discharged to home.  She will need ambulance transport home. Received call from Cassie, she states they will be able to send PT/OT out first to assess need for [facility] and HHAIDE and if needed then will provide services.  Will let MD know.   Final next level of care: [place] Barriers to Discharge: No Barriers Identified   Patient Goals and CMS Choice Patient states their goals for this hospitalization and ongoing recovery are:: go home CMS Medicare.gov Compare Post Acute Care list provided to:: Patient Represenative (must comment) Choice offered to / list presented to : Adult Children  Discharge Placement                       Discharge Plan and Services   Discharge Planning Services: CM Consult Post Acute Care Choice: Durable Medical Equipment, Home Health          DME Arranged: 3-N-1, Wheelchair manual, Walker rolling(hospital bed) DME Agency: [facility]( hospital bed)) Date DME Agency Contacted: 08/26/19 Time DME Agency Contacted: 1500 Representative spoke with at DME Agency: [place]: RN, PT, OT, Nurse's Aide [place] Agency: [facility] [place] Date [place]: 08/27/19 Time [place]: 44 Representative spoke with at [place]: Cassie  Social Determinants of Health ([place]) Interventions     Readmission Risk Interventions Readmission Risk Prevention Plan 08/26/2019 08/04/2019  Transportation Screening Complete Complete  PCP or Specialist Appt within 3-5 Days - Complete  HRI or [place] - Complete  Social Work Consult for [place] Planning/Counseling - Complete  Palliative Care Screening - Not Applicable  Medication Review Press photographer) Complete Complete  HRI or Home Care Consult Complete -  SW Recovery Care/Counseling Consult Complete -  Palliative Care Screening Not Applicable -  [place] Not Applicable -  Some recent data might be hidden

## 2019-08-27 NOTE — Progress Notes (Signed)
    Durable Medical Equipment  (From admission, onward)         Start     Ordered   08/27/19 1336  For home use only DME Hospital bed  Once    Question Answer Comment  Length of Need Lifetime   Patient has (list medical condition): CHF   The above medical condition requires: Patient requires the ability to reposition frequently   Head must be elevated greater than: 30 degrees   Bed type Semi-electric   Trapeze Bar Yes   Support Surface: Alternating Pressure Pad and Pump      08/27/19 1336   08/26/19 1151  For home use only DME Walker rolling  Once    Question Answer Comment  Walker: With [place]   Patient needs a walker to treat with the following condition Weakness      08/26/19 1150   08/26/19 1149  DME 3-in-1  Once    Comments: Drop arm   08/26/19 1149   08/26/19 1148  DME standard manual wheelchair with seat cushion  Once    Comments: Patient suffers from congestive heart failure which impairs their ability to perform daily activities like bathing, dressing and grooming in the home.  A walker will not resolve issue with performing activities of daily living. A wheelchair will allow patient to safely perform daily activities. Patient can safely propel the wheelchair in the home or has a caregiver who can provide assistance. Length of need Lifetime. Accessories: elevating leg rests (ELRs), wheel locks, extensions and anti-tippers. HD   08/26/19 1148   08/26/19 1144  For home use only DME Other see comment  Once    Comments: Harrel Lemon lift  Question:  Length of Need  Answer:  Lifetime   08/26/19 1143   08/26/19 0748  DME Walker  Once    Question Answer Comment  Walker: With 5 Inch Wheels   Patient needs a walker to treat with the following condition Heart failure ([place])      08/26/19 [id number]

## 2019-08-27 NOTE — TOC Transition Note (Addendum)
Transition of Care [facility]) - CM/SW Discharge Note   Patient Details  Name: Erika [name] MRN: [id number] Date of Birth: 1941-04-04  Transition of Care [facility]) CM/SW Contact:  Zenon Mayo, RN Phone Number: 08/27/2019, 12:06 PM   Clinical Narrative:    Patient is for dc today, NCM spoke with Jeneen Rinks with [facility], he states they will have hospital bed delivered today.  NCM gave him son's phone number to coordinate bed delivery.  NCM found out patient is active with [facility] , lvm to notify Cassie with [facility] that patient is for dc today and needs HHRN , [place], HHOT, HHAIDE.  Awaiting call back from [facility] and call back from [place] to let NCM know bed has been delivered before patient is discharged to home.  She will need ambulance transport home. Received call from Cassie, she states they will be able to send PT/OT out first to assess need for [facility] and HHAIDE and if needed then will provide services.  Will let MD know. [employer] [facility] called said they spoke with son and tried to deliver bed earlier today and son Designer, television/film set) states he will be at the house at 4 to receive delivery of bed and that's that.  NCM informed MD and Staff RN, left ptar phone number for Staff RN Mikle Bosworth to call once bed has been delivered.  Ambulance forms at front desk on unit.  NCM spoke with Jeneen Rinks with [facility], they are at the house now setting up hospital bed.  NCM spoke with Gerald Stabs and he said to schedule ambulance pick up at 5:30.  NCM called ptar for transport at 5:30 and informed Administrator.     Final next level of care: [place] Barriers to Discharge: Equipment Delay   Patient Goals and CMS Choice Patient states their goals for this hospitalization and ongoing recovery are:: go home CMS Medicare.gov Compare Post Acute Care list provided to:: Patient Represenative (must comment)(son) Choice offered to / list presented to : Adult Children  Discharge Placement                        Discharge Plan and Services   Discharge Planning Services: CM Consult Post Acute Care Choice: Durable Medical Equipment, Home Health          DME Arranged: 3-N-1, Wheelchair manual, Walker rolling(hospital bed) DME Agency: [facility]( hospital bed)) Date DME Agency Contacted: 08/26/19 Time DME Agency Contacted: 1500 Representative spoke with at DME Agency: [place]: OT, PT [place] Agency: [facility] [place] Date [place]: 08/27/19 Time [place]: 1206 Representative spoke with at [place]: Cassie  Social Determinants of Health ([place]) Interventions     Readmission Risk Interventions Readmission Risk Prevention Plan 08/26/2019 08/04/2019  Transportation Screening Complete Complete  PCP or Specialist Appt within 3-5 Days - Complete  HRI or [place] - Complete  Social Work Consult for [place] Planning/Counseling - Complete  Palliative Care Screening - Not Applicable  Medication Review Press photographer) Complete Complete  HRI or Home Care Consult Complete -  SW Recovery Care/Counseling Consult Complete -  Palliative Care Screening Not Applicable -  [place] Not Applicable -  Some recent data might be hidden

## 2019-08-28 DIAGNOSIS — I5031 Acute diastolic (congestive) heart failure: Secondary | ICD-10-CM | POA: Diagnosis not present

## 2019-08-28 DIAGNOSIS — I5033 Acute on chronic diastolic (congestive) heart failure: Secondary | ICD-10-CM | POA: Diagnosis not present

## 2019-08-28 DIAGNOSIS — R278 Other lack of coordination: Secondary | ICD-10-CM | POA: Diagnosis not present

## 2019-08-28 DIAGNOSIS — R531 Weakness: Secondary | ICD-10-CM | POA: Diagnosis not present

## 2019-08-28 DIAGNOSIS — I350 Nonrheumatic aortic (valve) stenosis: Secondary | ICD-10-CM | POA: Diagnosis not present

## 2019-09-07 ENCOUNTER — Ambulatory Visit: Payer: PPO

## 2019-09-08 ENCOUNTER — Inpatient Hospital Stay: Payer: PPO | Attending: Hematology | Admitting: Hematology

## 2019-09-08 ENCOUNTER — Telehealth: Payer: Self-pay | Admitting: Internal Medicine

## 2019-09-08 ENCOUNTER — Ambulatory Visit (INDEPENDENT_AMBULATORY_CARE_PROVIDER_SITE_OTHER): Payer: PPO | Admitting: Internal Medicine

## 2019-09-08 ENCOUNTER — Inpatient Hospital Stay: Payer: PPO

## 2019-09-08 ENCOUNTER — Other Ambulatory Visit: Payer: Self-pay

## 2019-09-08 DIAGNOSIS — I5032 Chronic diastolic (congestive) heart failure: Secondary | ICD-10-CM | POA: Diagnosis not present

## 2019-09-08 DIAGNOSIS — R278 Other lack of coordination: Secondary | ICD-10-CM | POA: Diagnosis not present

## 2019-09-08 DIAGNOSIS — Z79899 Other long term (current) drug therapy: Secondary | ICD-10-CM | POA: Diagnosis not present

## 2019-09-08 DIAGNOSIS — I350 Nonrheumatic aortic (valve) stenosis: Secondary | ICD-10-CM

## 2019-09-08 DIAGNOSIS — I5031 Acute diastolic (congestive) heart failure: Secondary | ICD-10-CM | POA: Diagnosis not present

## 2019-09-08 DIAGNOSIS — I5033 Acute on chronic diastolic (congestive) heart failure: Secondary | ICD-10-CM | POA: Diagnosis not present

## 2019-09-08 DIAGNOSIS — R531 Weakness: Secondary | ICD-10-CM | POA: Diagnosis not present

## 2019-09-08 NOTE — Telephone Encounter (Signed)
Returned call to [employer]. Verbal auth given for referral to [facility] SW. Will route to PCP for agreement/denial. Hubbard Hartshorn, RN, BSN

## 2019-09-08 NOTE — Telephone Encounter (Signed)
[facility] Nurse calling to request  Orders for a Education officer, museum for [employer].  Please call back.

## 2019-09-09 ENCOUNTER — Encounter: Payer: Self-pay | Admitting: Physician Assistant

## 2019-09-09 ENCOUNTER — Encounter: Payer: Self-pay | Admitting: Internal Medicine

## 2019-09-09 ENCOUNTER — Telehealth (INDEPENDENT_AMBULATORY_CARE_PROVIDER_SITE_OTHER): Payer: PPO | Admitting: Physician Assistant

## 2019-09-09 VITALS — Ht 67.0 in

## 2019-09-09 DIAGNOSIS — I5032 Chronic diastolic (congestive) heart failure: Secondary | ICD-10-CM

## 2019-09-09 DIAGNOSIS — I10 Essential (primary) hypertension: Secondary | ICD-10-CM | POA: Diagnosis not present

## 2019-09-09 DIAGNOSIS — I350 Nonrheumatic aortic (valve) stenosis: Secondary | ICD-10-CM | POA: Diagnosis not present

## 2019-09-09 DIAGNOSIS — I480 Paroxysmal atrial fibrillation: Secondary | ICD-10-CM

## 2019-09-09 DIAGNOSIS — I5033 Acute on chronic diastolic (congestive) heart failure: Secondary | ICD-10-CM | POA: Insufficient documentation

## 2019-09-09 NOTE — Patient Instructions (Signed)
Medication Instructions:  Your physician recommends that you continue on your current medications as directed. Please refer to the Current Medication list given to you today.  *If you need a refill on your cardiac medications before your next appointment, please call your pharmacy*  Lab Work: Your physician recommends that you return for lab work in 1 week for BMET to check your kidney function.  If you have labs (blood work) drawn today and your tests are completely normal, you will receive your results only by: [name] MyChart Message (if you have MyChart) OR . A paper copy in the mail If you have any lab test that is abnormal or we need to change your treatment, we will call you to review the results.   Follow-Up: At [facility], you and your health needs are our priority.  As part of our continuing mission to provide you with exceptional heart care, we have created designated Provider Care Teams.  These Care Teams include your primary Cardiologist (physician) and Advanced Practice Providers (APPs -  Physician Assistants and Nurse Practitioners) who all work together to provide you with the care you need, when you need it.  Your next appointment:   3 month(s) (a scheduler will call you to schedule this appointment)  The format for your next appointment:   In Person  Provider:   Peter Martinique, MD  Other Instructions  Your weight scale is in our office and will be at check in for you to pick up next time you are here. If you are able to come here to have your blood work done, you can pick it up then.

## 2019-09-09 NOTE — Telephone Encounter (Signed)
Agree 

## 2019-09-09 NOTE — Assessment & Plan Note (Signed)
Patient was admitted 1/15-1/21 for acute heart failure exacerbation.  She diuresed well with IV Lasix and was discharged home on Lasix 40 mg daily.  Discharge weight was 122 kg.  She reports doing well since being home.  She is getting physical therapy 2 times per week.  She is compliant with her Lasix dose and reports good urine output on it.  Unfortunately she does not have a scale at home and does not weigh herself daily.  She reports some dyspnea on exertion that has been improving with PT.  Denies orthopnea and PND. She was also found to have moderate AS on echo. Appears to be asymptomatic at this time. She was advised by the inpatient team to follow up with her cardiologist Dr. Martinique.  -Continue Lasix 40 mg daily   - In person follow up in 2 weeks - [facility] consult place given multiple recent admissions

## 2019-09-09 NOTE — Progress Notes (Signed)
Virtual Visit via Telephone Note   This visit type was conducted due to national recommendations for restrictions regarding the COVID-19 Pandemic (e.g. social distancing) in an effort to limit this patient's exposure and mitigate transmission in our community.  Due to her co-morbid illnesses, this patient is at least at moderate risk for complications without adequate follow up.  This format is felt to be most appropriate for this patient at this time.  The patient did not have access to video technology/had technical difficulties with video requiring transitioning to audio format only (telephone).  All issues noted in this document were discussed and addressed.  No physical exam could be performed with this format.  Please refer to the patient's chart for her  consent to telehealth for [facility].   Date:  09/11/2019   ID:  Erika [name], DOB Feb 08, 1941, MRN [id number]  Patient Location: Home Provider Location: Office  PCP:  [name], MD  Cardiologist:  Peter Martinique, MD  Electrophysiologist:  None   Evaluation Performed:  Follow-Up Visit  Chief Complaint:  followup  History of Present Illness:    Erika [name] is a 79 y.o. female with past medical history of renal SVT, asthma, hypertension, paroxysmal atrial flutter on Eliquis, myeloproliferative neoplasm and GERD. Patient was last seen by Dr. Angelena Form in October 2012 for evaluation of tachycardia.  EKG at the time demonstrated sinus rhythm with PACs and first-degree AV block.  She was admitted in October 2018 for leukocytosis.  Hospital course was complicated by atrial flutter with RVR.  She was started on Eliquis at the time for anticoagulation therapy.  She was not seen by a cardiologist however recommended outpatient follow-up which did not happen.  Echocardiogram at the time showed EF 50 to 55% with mild MR. She was admitted in Dec 2020 with confusion, AKI, UTI and dehydration.  More recently, patient was admitted with  worsening leg weakness and pain.  She also noted increasing lower extremity edema for 3 weeks.  Chest x-ray showed cardiomegaly with vascular congestion.  BNP 873.  She was treated with IV Lasix.  Echocardiogram obtained on 08/21/2019 showed EF 55 to 60%, moderate aortic stenosis, trivial mitral regurgitation, RVSP 46 mmHg.  She was discharged on 40 mg daily of Lasix.  Her discharge weight was 122 kg.  Patient was contacted today via telephone visit.  She denies any shortness of breath or lower extremity edema.  She is unable to check her weight at home due to lack of home scale.  We will attempt to get her scale on the next visit.  She has been compliant with 40 mg daily of Lasix and has noted good urinary output.  I recommended a basic metabolic panel to check her renal function and electrolytes.  Otherwise she denies any chest discomfort and can follow-up in 3 months.  The patient does not have symptoms concerning for COVID-19 infection (fever, chills, cough, or new shortness of breath).    Past Medical History:  Diagnosis Date  . Anemia    with menses  . Asthma   . Atrial flutter, paroxysmal ([place])   . Back pain    status post surgery 2002  . Breast cyst    Excesion with FNA, begnin in 2004.   . Degenerative joint disease of spine    Imaging 2005,  Degenerative hypertrophic facet arthritis changes L4-5 and L5-S1..   . History of shingles    Recurrent with post herpetic neuralgia.   [name] Hypertension   .  Lymphadenopathy    Of the mediastinum, Right side CXR 2008, not read on 2010 cxr.   . Menopause   . Obesity    BMI 54  . Psychosis ([place])    Secondary to prednisone.  . Shingles   . Stasis dermatitis    W/ LE edema, prviously on lasix now on mazxide.   . SVT (supraventricular tachycardia) [facility]) June 2009   one run while hospitalized  . Tuberculosis    active TB treated in 2002, hx of paraspinal lumbar TB,    Past Surgical History:  Procedure Laterality Date  . BREAST CYST  ASPIRATION Left   . Breast cyst biopsy    . CHOLECYSTECTOMY  02/02/2012   Procedure: LAPAROSCOPIC CHOLECYSTECTOMY;  Surgeon: Zenovia Jarred, MD;  Location: [place];  Service: General;  Laterality: N/A;  . Hemilaminectomy of L4, L5 and S1 decompression of tumor in epidural space  2000  . IR FLUORO GUIDED NEEDLE PLC ASPIRATION/INJECTION LOC  07/17/2019     Current Meds  Medication Sig  . ADVAIR DISKUS 500-50 MCG/DOSE AEPB INHALE 1 PUFF BY MOUTH TWICE DAILY (Patient taking differently: Inhale 1 puff into the lungs 2 (two) times daily. )  . albuterol (PROVENTIL HFA;VENTOLIN HFA) 108 (90 Base) MCG/ACT inhaler INHALE 1 TO 2 PUFFS BY MOUTH EVERY 6 HOURS AS NEEDED (Patient taking differently: Inhale 1-2 puffs into the lungs every 6 (six) hours as needed for wheezing or shortness of breath. )  . Amino Acids-Protein Hydrolys (FEEDING SUPPLEMENT, PRO-STAT SUGAR FREE 64,) LIQD Take 30 mLs by mouth daily.  [name] amLODipine (NORVASC) 10 MG tablet Take 1 tablet (10 mg total) by mouth daily. (Patient taking differently: Take 5 mg by mouth daily. )  . apixaban (ELIQUIS) 5 MG TABS tablet Take 1 tablet (5 mg total) by mouth 2 (two) times daily.  [name] aspirin EC 81 MG tablet Take 81 mg by mouth daily.  . Ensure Max Protein (ENSURE MAX PROTEIN) LIQD Take 330 mLs (11 oz total) by mouth 2 (two) times daily.  . fluticasone (FLONASE) 50 MCG/ACT nasal spray SHAKE LIQUID AND USE 1 SPRAY IN EACH NOSTRIL DAILY (Patient taking differently: Place 1 spray into both nostrils daily. )  . furosemide (LASIX) 40 MG tablet Take 1 tablet (40 mg total) by mouth daily.  [name] nystatin (MYCOSTATIN/NYSTOP) powder Apply topically 2 (two) times daily.  [name] omeprazole (PRILOSEC) 20 MG capsule TAKE 1 CAPSULE BY MOUTH EVERY DAY (Patient taking differently: Take 20 mg by mouth daily. )  . pravastatin (PRAVACHOL) 20 MG tablet Take 1 tablet (20 mg total) by mouth daily.  . sodium chloride (OCEAN) 0.65 % SOLN nasal spray Place 1 spray into both nostrils as  needed for congestion.     Allergies:   Prednisone   Social History   Tobacco Use  . Smoking status: Never Smoker  . Smokeless tobacco: Never Used  Substance Use Topics  . Alcohol use: No    Alcohol/week: 0.0 standard drinks  . Drug use: No     Family Hx: The patient's family history includes Breast cancer in an other family member; Cancer in her sister; Diabetes in her brother; Heart attack in her father; Heart disease in her father and mother; Sickle cell anemia in her brother; Stroke in her brother, brother, brother, and mother.  ROS:   Please see the history of present illness.     All other systems reviewed and are negative.   Prior CV studies:   The following studies were reviewed  today:  Echo 08/21/2019 IMPRESSIONS    1. Left ventricular ejection fraction, by visual estimation, is 55 to  60%. The left ventricle has normal function. There is mildly increased  left ventricular hypertrophy.  2. The aortic valve is abnormal. Moderate calcifications. Aortic valve  regurgitation is not visualized. Moderate aortic valve stenosis (Vmax 3.0  m/s, MG 22 mmHg, AVA 1.4 cm^2, DI 0.45)  3. Global right ventricle has normal systolic function.The right  ventricular size is mildly enlarged.  4. Left atrial size was moderately dilated.  5. Right atrial size was mildly dilated.  6. The mitral valve is normal in structure. Trivial mitral valve  regurgitation.  7. The tricuspid valve is normal in structure.  8. The pulmonic valve was not well visualized. Pulmonic valve  regurgitation is not visualized.  9. The tricuspid regurgitant velocity is 3.28 m/s, and with an assumed  right atrial pressure of 3 mmHg, the estimated right ventricular systolic  pressure is moderately elevated at 46.0 mmHg.  10. The inferior vena cava is normal in size with greater than 50%  respiratory variability, suggesting right atrial pressure of 3 mmHg.   FINDINGS  Left Ventricle: Left  ventricular ejection fraction, by visual estimation,  is 55 to 60%. The left ventricle has normal function. The left ventricle  has no regional wall motion abnormalities. There is mildly increased left  ventricular hypertrophy. Left  ventricular diastolic parameters were normal.   Labs/Other Tests and Data Reviewed:    EKG:  An ECG dated 08/21/2019 was personally reviewed today and demonstrated:  Normal sinus rhythm without significant ST-T wave changes.  Recent Labs: 08/02/2019: ALT 13 08/21/2019: B Natriuretic Peptide 873.6 08/23/2019: Hemoglobin 8.0; Platelets 193 08/24/2019: Magnesium 1.8 08/26/2019: BUN 25; Creatinine, Ser 1.15; Potassium 4.1; Sodium 135   Recent Lipid Panel Lab Results  Component Value Date/Time   CHOL 164 06/20/2016 10:44 AM   TRIG 63 06/20/2016 10:44 AM   HDL 86 06/20/2016 10:44 AM   CHOLHDL 1.9 06/20/2016 10:44 AM   CHOLHDL 2.9 03/10/2014 10:16 AM   LDLCALC 65 06/20/2016 10:44 AM    Wt Readings from Last 3 Encounters:  08/27/19 268 lb 15.4 oz (122 kg)  08/01/19 269 lb 6.4 oz (122.2 kg)  07/13/19 299 lb 13.2 oz (136 kg)     Objective:    Vital Signs:  Ht 5\' 7"  (1.702 m)   BMI 42.13 kg/m    VITAL SIGNS:  reviewed  ASSESSMENT & PLAN:    1. Chronic diastolic heart failure: She has been compliant with her diuretics since recent discharge.  She will need a basic metabolic panel.  I recommended she obtain daily weight, we will get her scale at home to keep weight diary  2. Hypertension: Continue on current medication  3. Paroxysmal atrial flutter on Eliquis: Maintaining sinus rhythm based on recent EKG.  4. Myeloproliferative neoplasm: Followed by oncology service  COVID-19 Education: The signs and symptoms of COVID-19 were discussed with the patient and how to seek care for testing (follow up with PCP or arrange E-visit).  The importance of social distancing was discussed today.  Time:   Today, I have spent 8 minutes with the patient with  telehealth technology discussing the above problems.     Medication Adjustments/Labs and Tests Ordered: Current medicines are reviewed at length with the patient today.  Concerns regarding medicines are outlined above.   Tests Ordered: Orders Placed This Encounter  Procedures  . Basic metabolic panel    Medication Changes:  No orders of the defined types were placed in this encounter.   Follow Up:  Either In Person or Virtual in 3 month(s)  Signed, Almyra Deforest, [place]  09/11/2019 11:52 PM    [place]

## 2019-09-09 NOTE — Progress Notes (Signed)
  [facility] Internal Medicine Residency Telephone Encounter Continuity Care Appointment  HPI:   This telephone encounter was created for [name] on 09/09/2019 for the following purpose/cc Hospital follow-up for acute heart failure exacerbation.   Past Medical History:  Past Medical History:  Diagnosis Date  . Anemia    with menses  . Asthma   . Atrial flutter, paroxysmal ([place])   . Back pain    status post surgery 2002  . Breast cyst    Excesion with FNA, begnin in 2004.   . Degenerative joint disease of spine    Imaging 2005,  Degenerative hypertrophic facet arthritis changes L4-5 and L5-S1..   . History of shingles    Recurrent with post herpetic neuralgia.   [name] Hypertension   . Lymphadenopathy    Of the mediastinum, Right side CXR 2008, not read on 2010 cxr.   . Menopause   . Obesity    BMI 54  . 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  . Shingles   . Stasis dermatitis    W/ LE edema, prviously on lasix now on mazxide.   . SVT (supraventricular tachycardia) [facility]) June 2009   one run while hospitalized  . Tuberculosis    active TB treated in 2002, hx of paraspinal lumbar TB,       ROS:      Assessment / Plan / Recommendations:   Please see A&P under problem oriented charting for assessment of the patient's acute and chronic medical conditions.   As always, pt is advised that if symptoms worsen or new symptoms arise, they should go to an urgent care facility or to to ER for further evaluation.   Consent and Medical Decision Making:   Patient discussed with Dr. Angelia Mould  This is a telephone encounter between Erika [name] and Erika [name] on 09/09/2019 for problem stated above. The visit was conducted with the patient located at home and Erika [name] at [facility]. The patient's identity was confirmed using their DOB and current address. The patient has consented to being evaluated through a telephone encounter and understands the  associated risks (an examination cannot be done and the patient may need to come in for an appointment) / benefits (allows the patient to remain at home, decreasing exposure to coronavirus). I personally spent 15 minutes on medical discussion.

## 2019-09-13 NOTE — Progress Notes (Signed)
Internal Medicine Clinic Attending  Case discussed with Dr. Santos-Sanchez at the time of the visit.  We reviewed the resident's history and exam and pertinent patient test results.  I agree with the assessment, diagnosis, and plan of care documented in the resident's note.    

## 2019-09-14 ENCOUNTER — Telehealth: Payer: Self-pay | Admitting: Hematology

## 2019-09-14 NOTE — Telephone Encounter (Signed)
Called to reschedule per 2/3 sch msg. Pt informed me that she would call back to get appts scheduled.

## 2019-09-19 ENCOUNTER — Other Ambulatory Visit: Payer: Self-pay | Admitting: Internal Medicine

## 2019-09-21 DIAGNOSIS — Z5189 Encounter for other specified aftercare: Secondary | ICD-10-CM | POA: Diagnosis not present

## 2019-09-22 ENCOUNTER — Telehealth: Payer: Self-pay

## 2019-09-22 NOTE — Telephone Encounter (Signed)
Dr. Daryll Drown,  Please see note below.  I asked [facility] if they could go out and perform a bedside glucose fingerstick and the [facility] nurse told me they could not do this procedure.  They could only repeat the venous stick if you wanted to order this. Thank you, SChaplin, RN,BSN

## 2019-09-22 NOTE — Telephone Encounter (Signed)
Received faxed lab results from [facility] via [facility].  Was a BMP, abnormals were: Creatinine 1.11 eGFR 56 Glucose 37  This nurse called [facility] [place], Tracie and asked if lab, particularly glucose result was followed up on?  Leana Roe states she "received a call from lab through the night regarding glucose result of 37, but states she figured it was an error because pt was laughing, sitting up on side of bed yesterday, talking normally, and had no signs of hypoglycemia".  This nurse asked if [facility] was required to report critical values to MD, she states "I don't know".  Tracie states value was not reported.    Will forward to Dr. Daryll Drown to advise.  [facility] nurse states they are not scheduled to go out again until later in the week.   Thank you, SChaplin, RN,BSN

## 2019-09-24 NOTE — Telephone Encounter (Signed)
I would like the HHN to recheck by venous; please give them a voice order?  Can you call family and see if they have a glucometer at home?  The family could assist her in doing a fingerstick and report back to [place].  Thanks!  Definitely find out if they are reporting out critical abnormals to anyone, as this should have been.

## 2019-09-25 ENCOUNTER — Other Ambulatory Visit: Payer: Self-pay | Admitting: Internal Medicine

## 2019-09-25 ENCOUNTER — Telehealth: Payer: Self-pay

## 2019-09-25 DIAGNOSIS — I5032 Chronic diastolic (congestive) heart failure: Secondary | ICD-10-CM

## 2019-09-25 NOTE — Telephone Encounter (Signed)
Thank you :)

## 2019-09-25 NOTE — Telephone Encounter (Signed)
Erika [name],PT, from [facility] - requesting verbal orders "To continue PT for 2 times a week x 4 weeks"; stated pt has decrease endurance and some sob. VO given - if not appropriate, let me know. Thanks

## 2019-09-25 NOTE — Telephone Encounter (Signed)
Returned call to [place]. No answer. Left message on VM requesting return call. Hubbard Hartshorn, BSN, RN-BC

## 2019-09-25 NOTE — Telephone Encounter (Signed)
Betsy with [facility] requesting VO for PT. Please call back.

## 2019-09-25 NOTE — Telephone Encounter (Signed)
Agree. Thanks

## 2019-09-25 NOTE — Telephone Encounter (Signed)
Spoke with Leana Roe, RN Case Manager at [employer] [employer]. Patient's next visit is Monday 09/28/2019. She will draw a BMP and report any critical results to [facility]. She states patient does not have a glucometer at home. Hubbard Hartshorn, BSN, RN-BC

## 2019-09-26 DIAGNOSIS — I5033 Acute on chronic diastolic (congestive) heart failure: Secondary | ICD-10-CM | POA: Diagnosis not present

## 2019-09-26 DIAGNOSIS — R4182 Altered mental status, unspecified: Secondary | ICD-10-CM | POA: Diagnosis not present

## 2019-09-26 DIAGNOSIS — R531 Weakness: Secondary | ICD-10-CM | POA: Diagnosis not present

## 2019-09-27 DIAGNOSIS — I509 Heart failure, unspecified: Secondary | ICD-10-CM | POA: Diagnosis not present

## 2019-09-28 ENCOUNTER — Other Ambulatory Visit: Payer: Self-pay

## 2019-09-28 DIAGNOSIS — J4530 Mild persistent asthma, uncomplicated: Secondary | ICD-10-CM

## 2019-09-28 DIAGNOSIS — K219 Gastro-esophageal reflux disease without esophagitis: Secondary | ICD-10-CM

## 2019-09-28 DIAGNOSIS — I480 Paroxysmal atrial fibrillation: Secondary | ICD-10-CM

## 2019-09-28 NOTE — Telephone Encounter (Signed)
Received fax from [place] for refills. I called pt to be sure she has changed pharmacies; stated she wants to try this mail order pharmacy b/c her meds will be delivered since she cannot go to the pharmacy for herself. And if not satisfied she will change back. Stated she's out of Lasix,she wants it sent to [facility].

## 2019-09-28 NOTE — Telephone Encounter (Signed)
Next appt scheduled  3/26 with PCP.

## 2019-09-28 NOTE — Telephone Encounter (Signed)
furosemide (LASIX) 40 MG tablet, REFILL REQUEST @  [facility] [phone] - [place], [address] AT [place] [phone] (Phone) [phone] (Fax)

## 2019-09-29 ENCOUNTER — Other Ambulatory Visit: Payer: Self-pay | Admitting: Internal Medicine

## 2019-09-29 ENCOUNTER — Telehealth: Payer: Self-pay | Admitting: *Deleted

## 2019-09-29 ENCOUNTER — Telehealth: Payer: Self-pay | Admitting: Hematology

## 2019-09-29 MED ORDER — FLUTICASONE PROPIONATE 50 MCG/ACT NA SUSP: 1.0000 | Freq: Every day | NASAL | 3 refills | Status: DC

## 2019-09-29 MED ORDER — METOPROLOL TARTRATE 50 MG PO TABS: 50.0000 mg | ORAL_TABLET | Freq: Two times a day (BID) | ORAL | 0 refills | Status: DC

## 2019-09-29 MED ORDER — PRAVASTATIN SODIUM 20 MG PO TABS: 20.0000 mg | ORAL_TABLET | Freq: Every day | ORAL | 3 refills | Status: DC

## 2019-09-29 MED ORDER — ASPIRIN EC 81 MG PO TBEC: 81.0000 mg | DELAYED_RELEASE_TABLET | Freq: Every day | ORAL | 3 refills | Status: DC

## 2019-09-29 MED ORDER — NYSTATIN 100000 UNIT/GM EX POWD: Freq: Two times a day (BID) | CUTANEOUS | 3 refills | Status: DC

## 2019-09-29 MED ORDER — APIXABAN 5 MG PO TABS: 5.0000 mg | ORAL_TABLET | Freq: Two times a day (BID) | ORAL | 11 refills | Status: DC

## 2019-09-29 MED ORDER — QUETIAPINE FUMARATE 25 MG PO TABS: 12.5000 mg | ORAL_TABLET | Freq: Every day | ORAL | 11 refills | Status: DC

## 2019-09-29 MED ORDER — OMEPRAZOLE 20 MG PO CPDR: 20.0000 mg | DELAYED_RELEASE_CAPSULE | Freq: Every day | ORAL | 3 refills | Status: DC

## 2019-09-29 MED ORDER — QUETIAPINE FUMARATE 25 MG PO TABS: 12.5000 mg | ORAL_TABLET | Freq: Every day | ORAL | 0 refills | Status: DC

## 2019-09-29 MED ORDER — ALBUTEROL SULFATE HFA 108 (90 BASE) MCG/ACT IN AERS: 1.0000 | INHALATION_SPRAY | Freq: Four times a day (QID) | RESPIRATORY_TRACT | 11 refills | Status: DC | PRN

## 2019-09-29 MED ORDER — METOPROLOL TARTRATE 50 MG PO TABS: 50.0000 mg | ORAL_TABLET | Freq: Two times a day (BID) | ORAL | 11 refills | Status: DC

## 2019-09-29 MED ORDER — FUROSEMIDE 40 MG PO TABS: 40.0000 mg | ORAL_TABLET | Freq: Every day | ORAL | 0 refills | Status: DC

## 2019-09-29 MED ORDER — NYSTATIN 100000 UNIT/GM EX POWD: Freq: Two times a day (BID) | CUTANEOUS | 0 refills | Status: DC

## 2019-09-29 MED ORDER — AMLODIPINE BESYLATE 10 MG PO TABS: 5.0000 mg | ORAL_TABLET | Freq: Every day | ORAL | 3 refills | Status: DC

## 2019-09-29 MED ORDER — SALINE SPRAY 0.65 % NA SOLN: 1.0000 | NASAL | 0 refills | Status: AC | PRN

## 2019-09-29 MED ORDER — ADVAIR DISKUS 500-50 MCG/DOSE IN AEPB: 1.0000 | INHALATION_SPRAY | Freq: Two times a day (BID) | RESPIRATORY_TRACT | 3 refills | Status: DC

## 2019-09-29 NOTE — Telephone Encounter (Signed)
Is this a worsening compared to last week?  Or her new baseline?  If worsening and weight worsening, would have her come in.

## 2019-09-29 NOTE — Addendum Note (Signed)
Addended by: Ebbie Latus on: 09/29/2019 11:23 AM   Modules accepted: Orders

## 2019-09-29 NOTE — Telephone Encounter (Signed)
Scheduled 3/24 appt per 2/22 sch msg. Left pt a voicemail with appt details and mailed a reminder letter and calendar.

## 2019-09-29 NOTE — Telephone Encounter (Signed)
They do not have anyway of knowing her weight This was first visit w/ this PT person, pt stated this was her norm. Should get lab results tomorrow

## 2019-09-29 NOTE — Telephone Encounter (Signed)
[facility] nurse calls and states  Labs were drawn today  BLE 3+edema Pt very short of breath on exertion and less at rest 02sat 93 to 95 She is asked to inform pt to call 911 if short of breath becomes worse or has chest pain or both together Pt's son is suppose to be picking up a scale for daily weights but she doesn't know when he will do so Refused appt in [place] at this time, agreeable until labs received. Do you agree? Advise?

## 2019-09-30 ENCOUNTER — Other Ambulatory Visit: Payer: PPO | Admitting: General Practice

## 2019-09-30 ENCOUNTER — Telehealth: Payer: Self-pay

## 2019-09-30 NOTE — Telephone Encounter (Signed)
Received faxed results for BMP from [facility] with following message, collect date 09/29/2019:  "Whole blood, unspun or partially spun gel barrier tube was received more than 6 hours since collection.  A false elevation of K, Phos and LD as well as a false decrease in glucose may occur due to prolonged contact with red cells".  BMP Results:  Urea Nitrogen   20 Creatinine  0.93 BUN/Creatinine ratio N/A Sodium  139 Potassium   3.3  L Chloride  96   L Carbon Dioxide 21 Calcium  9.2  Protein, total  7.2 Albumin  3.6 Globulin  3.6 Albumin/Globulin ratio 1.0 Bilirubin, total  0.3 Alkaline Phosphatase 342  AST   15 ALT    7  Glucose  Test not performed (No suitable specimen received)    Dr. Daryll Drown, Please advise if you would like labs redrawn or would rather pt come in for appt in [facility] and labs. Will place hardcopy in PCP's box. Thank you, Higinio Roger, RN,BSN

## 2019-10-01 ENCOUNTER — Other Ambulatory Visit: Payer: Self-pay | Admitting: *Deleted

## 2019-10-01 MED ORDER — POTASSIUM CHLORIDE ER 10 MEQ PO TBCR: 20.0000 meq | EXTENDED_RELEASE_TABLET | Freq: Every day | ORAL | 0 refills | Status: DC

## 2019-10-01 NOTE — Telephone Encounter (Signed)
Labs appear stable, apart from the low K.   Will order KCL 62meq to take once daily for 5 days.  She has an appointment in 1 month with me, which should be appropriate.   Can you give a VO for a repeat BMET in 2 weeks to check potassium?   Also, contact patient/family to pick up new medication.   Thanks!  Gilles Chiquito, MD

## 2019-10-01 NOTE — Telephone Encounter (Signed)
ERROR

## 2019-10-01 NOTE — Telephone Encounter (Signed)
See comment

## 2019-10-03 ENCOUNTER — Other Ambulatory Visit: Payer: Self-pay | Admitting: Internal Medicine

## 2019-10-05 ENCOUNTER — Other Ambulatory Visit: Payer: Self-pay | Admitting: Internal Medicine

## 2019-10-05 DIAGNOSIS — I350 Nonrheumatic aortic (valve) stenosis: Secondary | ICD-10-CM | POA: Diagnosis not present

## 2019-10-05 DIAGNOSIS — I5031 Acute diastolic (congestive) heart failure: Secondary | ICD-10-CM | POA: Diagnosis not present

## 2019-10-05 DIAGNOSIS — I5033 Acute on chronic diastolic (congestive) heart failure: Secondary | ICD-10-CM | POA: Diagnosis not present

## 2019-10-05 DIAGNOSIS — R531 Weakness: Secondary | ICD-10-CM | POA: Diagnosis not present

## 2019-10-05 DIAGNOSIS — R278 Other lack of coordination: Secondary | ICD-10-CM | POA: Diagnosis not present

## 2019-10-05 NOTE — Telephone Encounter (Signed)
She can continue to take the potassium she has at home.  Please have home health nurse draw a BMET at next visit.  Thanks

## 2019-10-05 NOTE — Telephone Encounter (Signed)
This was a short term Rx only.  Can you see if she received it?  Thank you

## 2019-10-05 NOTE — Telephone Encounter (Signed)
Called pt - stated she has been taking potassium 20 meg 1/2 tab daily since Friday. I also called [facility] -stated pt has not pick up last rx written on 2/25 per Dr Daryll Drown. Pt had stated she already has some at home. Please advise.

## 2019-10-06 DIAGNOSIS — Z5189 Encounter for other specified aftercare: Secondary | ICD-10-CM | POA: Diagnosis not present

## 2019-10-06 NOTE — Telephone Encounter (Signed)
Called pt - informed to continue taking potassium as at home per Dr Daryll Drown also will need to do blood work to check K+ level. Stated she uses [facility].  I called [facility] [phone])- Talked to [facility], nursing supervisor, verbal order given for BMET per Dr Daryll Drown. Stated nurse will see pt today; if unable, will schedule another visit for later in the week.

## 2019-10-07 ENCOUNTER — Telehealth: Payer: Self-pay

## 2019-10-07 NOTE — Telephone Encounter (Signed)
Received faxed copy of BMP reseults from [facility] with following results:  BMP lab collected on 10/06/2019:  Glucose   36 LL  (Result consistent with prolonged exposure to red blood cells.       Interpret result with Caution.  BUN    17 Creatinine   1.03 H  eGFR (non-Afr.American) 53 L eGFR (Afr. American)  61 BUN/Creatinine Ration 17 Sodium   138 Potassium   4.1 Chloride   99 Carbon Dioxide  26 Calcium   9.4  Will forward to PCP and place in her box. SChaplin, RN,BSN

## 2019-10-08 NOTE — Telephone Encounter (Signed)
Thank you.   Please call patient and ask her to stop Potassium supplement.   Gilles Chiquito, MD

## 2019-10-08 NOTE — Telephone Encounter (Signed)
TC to patient, VM obtained, message left to call traige back. SChaplin, RN,BSN

## 2019-10-09 NOTE — Telephone Encounter (Signed)
Spoke to pt's son and notified him pt's K+ level was normal and she could stop taking the K+ supplement, verbalized understanding. SChaplin, RN,BSN

## 2019-10-09 NOTE — Telephone Encounter (Signed)
Thank you :)

## 2019-10-12 ENCOUNTER — Ambulatory Visit: Payer: Self-pay | Admitting: *Deleted

## 2019-10-12 ENCOUNTER — Ambulatory Visit: Payer: PPO

## 2019-10-12 ENCOUNTER — Telehealth: Payer: PPO

## 2019-10-12 DIAGNOSIS — I480 Paroxysmal atrial fibrillation: Secondary | ICD-10-CM

## 2019-10-12 DIAGNOSIS — I10 Essential (primary) hypertension: Secondary | ICD-10-CM

## 2019-10-12 DIAGNOSIS — I5032 Chronic diastolic (congestive) heart failure: Secondary | ICD-10-CM

## 2019-10-12 NOTE — Progress Notes (Signed)
Opened in erro

## 2019-10-12 NOTE — Chronic Care Management (AMB) (Signed)
Chronic Care Management   Initial Visit Note  10/12/2019 Name: Erika [name]: [id number] DOB: 02-Nov-1940  Referred by: Erika Falcon, MD Reason for referral : Chronic Care Management (Initial outreach HF, HTN)   Erika [name] is a 79 y.o. year old female who is a primary care patient of Erika Falcon, MD. The CCM team was consulted for assistance with chronic disease management and care coordination needs related to CHF and HTN and HLD.   Review of patient status, including review of consultants reports, relevant laboratory and other test results, and collaboration with appropriate care team members and the patient's provider was performed as part of comprehensive patient evaluation and provision of chronic care management services.    SDOH (Social Determinants of Health) assessments performed: Yes See Care Plan activities for detailed interventions related to SDOH)     Medications: Outpatient Encounter Medications as of 10/12/2019  Medication Sig Note   ADVAIR DISKUS 500-50 MCG/DOSE AEPB Inhale 1 puff into the lungs 2 (two) times daily.    albuterol (VENTOLIN HFA) 108 (90 Base) MCG/ACT inhaler Inhale 1-2 puffs into the lungs every 6 (six) hours as needed for wheezing or shortness of breath.    allopurinol (ZYLOPRIM) 300 MG tablet Take 0.5 tablets (150 mg total) by mouth 2 (two) times daily.    Amino Acids-Protein Hydrolys (FEEDING SUPPLEMENT, PRO-STAT SUGAR FREE 64,) LIQD Take 30 mLs by mouth daily. 08/21/2019: Not on MAR   amLODipine (NORVASC) 10 MG tablet Take 0.5 tablets (5 mg total) by mouth daily.    apixaban (ELIQUIS) 5 MG TABS tablet Take 1 tablet (5 mg total) by mouth 2 (two) times daily.    aspirin EC 81 MG tablet Take 1 tablet (81 mg total) by mouth daily.    Ensure Max Protein (ENSURE MAX PROTEIN) LIQD Take 330 mLs (11 oz total) by mouth 2 (two) times daily.    fluticasone (FLONASE) 50 MCG/ACT nasal spray Place 1 spray into both nostrils daily.    furosemide  (LASIX) 40 MG tablet Take 1 tablet (40 mg total) by mouth daily.    metoprolol tartrate (LOPRESSOR) 50 MG tablet Take 1 tablet (50 mg total) by mouth 2 (two) times daily.    nystatin (MYCOSTATIN/NYSTOP) powder Apply topically 2 (two) times daily.    omeprazole (PRILOSEC) 20 MG capsule Take 1 capsule (20 mg total) by mouth daily.    potassium chloride (KLOR-CON) 10 MEQ tablet Take 2 tablets (20 mEq total) by mouth daily for 5 days.    pravastatin (PRAVACHOL) 20 MG tablet Take 1 tablet (20 mg total) by mouth daily.    QUEtiapine (SEROQUEL) 25 MG tablet Take 0.5 tablets (12.5 mg total) by mouth at bedtime.    sodium chloride (OCEAN) 0.65 % SOLN nasal spray Place 1 spray into both nostrils as needed for congestion.    No facility-administered encounter medications on file as of 10/12/2019.     Objective:  BP Readings from Last 3 Encounters:  08/27/19 132/66  08/05/19 133/62  07/28/19 (!) 151/70   Lab Results  Component Value Date   LDLCALC 65 06/20/2016- LDL overdue    Lab Results  Component Value Date   CHOL 164 06/20/2016   CHOL 204 (H) 03/10/2014   CHOL 217 (H) 02/09/2013   Lab Results  Component Value Date   HDL 86 06/20/2016   HDL 70 03/10/2014   HDL 73 02/09/2013   Lab Results  Component Value Date   LDLCALC 65 06/20/2016   [place]  116 (H) 03/10/2014   LDLCALC 131 (H) 02/09/2013   Lab Results  Component Value Date   TRIG 63 06/20/2016   TRIG 90 03/10/2014   TRIG 66 02/09/2013   Lab Results  Component Value Date   CHOLHDL 1.9 06/20/2016   CHOLHDL 2.9 03/10/2014   CHOLHDL 3.0 02/09/2013   No results found for: LDLDIRECT  Goals Addressed            This Visit's Progress     Patient Stated    " I hope to be able to get around better and feel better in a few weeks" (pt-stated)       [place] (see longitudinal plan of care for additional care plan information)   Current Barriers:   Chronic Disease Management support, education, and care  coordination needs related to transportation needs in a patient with CHF and HTN - patient states she has had difficulty ambulating and caring for self due deconditioning related to 3 hospitalizations between December 2020 and January 2021. She states she is currently working with home health to get stronger and more mobile and is hopeful she will continue to progress.   Case Manager Clinical Goal(s):   Over the next 30 days, patient will work with BSW to address needs related to  transportation issues  in patient with CHF and HTN  Interventions:   Collaborated with BSW to initiate plan of care to address needs related to  transportation  in patient with CHF and HTN  Patient Self Care Activities:   Self administers medications as prescribed  Attends all scheduled provider appointments  Calls pharmacy for medication refills   Initial goal documentation       Erika [name] was given information about Chronic Care Management services today including:  1. CCM service includes personalized support from designated clinical staff supervised by her physician, including individualized plan of care and coordination with other care providers 2. 24/7 contact phone numbers for assistance for urgent and routine care needs. 3. Service will only be billed when office clinical staff spend 20 minutes or more in a month to coordinate care. 4. Only one practitioner may furnish and bill the service in a calendar month. 5. The patient may stop CCM services at any time (effective at the end of the month) by phone call to the office staff. 6. The patient will be responsible for cost sharing (co-pay) of up to 20% of the service fee (after annual deductible is met).  Patient agreed to services and verbal consent obtained.   Plan:   The care management team will reach out to the patient again over the next 7 days.  Kelli Churn RN, CCM, [facility] RN Care Manager [phone]

## 2019-10-12 NOTE — Patient Instructions (Signed)
Visit Information  Goals Addressed            This Visit's Progress     Patient Stated   . " I hope to be able to get around better and feel better in a few weeks" (pt-stated)       [place] (see longitudinal plan of care for additional care plan information)   Current Barriers:  . Chronic Disease Management support, education, and care coordination needs related to transportation needs in a patient with CHF and HTN - patient states she has had difficulty ambulating and caring for self due deconditioning related to 3 hospitalizations between December 2020 and January 2021. She states she is currently working with home health to get stronger and more mobile and is hopeful she will continue to progress.   Case Manager Clinical Goal(s):  [name] Over the next 30 days, patient will work with BSW to address needs related to  transportation issues  in patient with CHF and HTN  Interventions:  . Collaborated with BSW to initiate plan of care to address needs related to  transportation  in patient with CHF and HTN  Patient Self Care Activities:  . Self administers medications as prescribed . Attends all scheduled provider appointments . Calls pharmacy for medication refills   Initial goal documentation        The patient verbalized understanding of instructions provided today and declined a print copy of patient instruction materials.   The care management team will reach out to the patient again over the next 7 days.   Kelli Churn RN, CCM, [facility] RN Care Manager [phone]

## 2019-10-12 NOTE — Patient Instructions (Signed)
Social Worker Visit Information  Goals we discussed today:  Goals Addressed            This Visit's Progress   . "I can't get too and from the car right now without help" (pt-stated)       CARE PLAN ENTRY (see longtitudinal plan of care for additional care plan information)  Current Barriers:  . Transportation    Social Work Clinical Goal(s):  . Over the next 30 days, patient will work with SW to address concerns related to transportation   Interventions: . Patient interviewed and appropriate assessments performed . Collaborated with RN Case Manager  . Collaborated with [facility] staff to confirm assistance for upcoming appointments. . Completed SCAT application and submitted to eligibility.   Patient Self Care Activities:  . Performs ADL's independently . Unable to perform IADLs independently  Initial goal documentation         Materials provided: Yes:   Ms. Rawl was given information about Chronic Care Management services today including:  1. CCM service includes personalized support from designated clinical staff supervised by her physician, including individualized plan of care and coordination with other care providers 2. 24/7 contact phone numbers for assistance for urgent and routine care needs. 3. Service will only be billed when office clinical staff spend 20 minutes or more in a month to coordinate care. 4. Only one practitioner may furnish and bill the service in a calendar month. 5. The patient may stop CCM services at any time (effective at the end of the month) by phone call to the office staff. 6. The patient will be responsible for cost sharing (co-pay) of up to 20% of the service fee (after annual deductible is met).  Patient agreed to services and verbal consent obtained.   The patient verbalized understanding of instructions provided today and declined a print copy of patient instruction materials.   Follow up plan: SW will follow up  with patient by phone over the next three weeks    Total time spent performing care coordination and/or care management activities with the patient by phone or face to face = 20 minutes.   Nadalie Laughner, BSW Embedded Care Coordination Social Worker [facility] [phone]                              

## 2019-10-12 NOTE — Chronic Care Management (AMB) (Signed)
  Chronic Care Management    Social Work CCM Outreach Note  10/12/2019 Name: Erika [name] MRN: [id number] DOB: Sep 12, 1940  Erika [name] is a 79 y.o. year old female who is a primary care patient of [name], MD . The CCM team was consulted for assistance with Transportation Needs .   LCSW reached out to Erika [name] today by phone to introduce and offer CCM services.   Erika [name] was given information about Chronic Care Management services today including:  1. CCM service includes personalized support from designated clinical staff supervised by her physician, including individualized plan of care and coordination with other care providers 2. 24/7 contact phone numbers for assistance for urgent and routine care needs. 3. Service will only be billed when office clinical staff spend 20 minutes or more in a month to coordinate care. 4. Only one practitioner may furnish and bill the service in a calendar month. 5. The patient may stop CCM services at any time (effective at the end of the month) by phone call to the office staff. 6. The patient will be responsible for cost sharing (co-pay) of up to 20% of the service fee (after annual deductible is met). Goals Addressed            This Visit's Progress   . "I can't get too and from the car right now without help" (pt-stated)       [place] (see longtitudinal plan of care for additional care plan information)  Current Barriers:  . Transportation    Social Work Clinical Goal(s):  [name] Over the next 30 days, patient will work with SW to address concerns related to transportation   Interventions: . Patient interviewed and appropriate assessments performed . Collaborated with RN Case Freight forwarder  . Collaborated with [facility] staff to confirm assistance for upcoming appointments. . Completed SCAT application and submitted to eligibility.   Patient Self Care Activities:  . Performs ADL's independently . Unable to  perform IADLs independently  Initial goal documentation         Patient agreed to services and verbal consent obtained.    Follow Up Plan: SW will follow up with patient by phone over the next three weeks   Total time spent performing care coordination and/or care management activities with the patient by phone or face to face 20 minutes.   Erika [name], [place] Coordination Social Worker [place] [phone]

## 2019-10-13 ENCOUNTER — Ambulatory Visit: Payer: PPO | Admitting: *Deleted

## 2019-10-13 DIAGNOSIS — I480 Paroxysmal atrial fibrillation: Secondary | ICD-10-CM

## 2019-10-13 DIAGNOSIS — I5032 Chronic diastolic (congestive) heart failure: Secondary | ICD-10-CM

## 2019-10-13 DIAGNOSIS — I10 Essential (primary) hypertension: Secondary | ICD-10-CM

## 2019-10-13 NOTE — Chronic Care Management (AMB) (Signed)
Chronic Care Management   Follow Up Note   10/13/2019 Name: Erika [name] MRN: [id number] DOB: 10-14-1940  Referred by: [name], MD Reason for referral : Chronic Care Management (Completing initial assessment HTN, CHF)   Erika [name] is a 79 y.o. year old female who is a primary care patient of [name], MD. The CCM team was consulted for assistance with chronic disease management and care coordination needs.  Follow up call to complete initial assessment that was started 10/12/19.  Review of patient status, including review of consultants reports, relevant laboratory and other test results, and collaboration with appropriate care team members and the patient's provider was performed as part of comprehensive patient evaluation and provision of chronic care management services.    SDOH (Social Determinants of Health) assessments performed: No See Care Plan activities for detailed interventions related to [facility])     Outpatient Encounter Medications as of 10/13/2019  Medication Sig Note   ADVAIR DISKUS 500-50 MCG/DOSE AEPB Inhale 1 puff into the lungs 2 (two) times daily.    albuterol (VENTOLIN HFA) 108 (90 Base) MCG/ACT inhaler Inhale 1-2 puffs into the lungs every 6 (six) hours as needed for wheezing or shortness of breath.    amLODipine (NORVASC) 10 MG tablet Take 0.5 tablets (5 mg total) by mouth daily.    apixaban (ELIQUIS) 5 MG TABS tablet Take 1 tablet (5 mg total) by mouth 2 (two) times daily.    aspirin EC 81 MG tablet Take 1 tablet (81 mg total) by mouth daily.    fluticasone (FLONASE) 50 MCG/ACT nasal spray Place 1 spray into both nostrils daily.    furosemide (LASIX) 40 MG tablet Take 1 tablet (40 mg total) by mouth daily.    metoprolol tartrate (LOPRESSOR) 50 MG tablet Take 1 tablet (50 mg total) by mouth 2 (two) times daily.    omeprazole (PRILOSEC) 20 MG capsule Take 1 capsule (20 mg total) by mouth daily.    pravastatin (PRAVACHOL) 20 MG tablet Take 1  tablet (20 mg total) by mouth daily.    QUEtiapine (SEROQUEL) 25 MG tablet Take 0.5 tablets (12.5 mg total) by mouth at bedtime.    sodium chloride (OCEAN) 0.65 % SOLN nasal spray Place 1 spray into both nostrils as needed for congestion.    allopurinol (ZYLOPRIM) 300 MG tablet Take 0.5 tablets (150 mg total) by mouth 2 (two) times daily.    Amino Acids-Protein Hydrolys (FEEDING SUPPLEMENT, PRO-STAT SUGAR FREE 64,) LIQD Take 30 mLs by mouth daily. 08/21/2019: Not on MAR   Ensure Max Protein (ENSURE MAX PROTEIN) LIQD Take 330 mLs (11 oz total) by mouth 2 (two) times daily. (Patient not taking: Reported on 10/13/2019)    nystatin (MYCOSTATIN/NYSTOP) powder Apply topically 2 (two) times daily. (Patient not taking: Reported on 10/13/2019)    potassium chloride (KLOR-CON) 10 MEQ tablet Take 2 tablets (20 mEq total) by mouth daily for 5 days.    No facility-administered encounter medications on file as of 10/13/2019.     Objective:  Per echocardiogram results of 08/21/19  Left Ventricle: Left ventricular ejection fraction, by visual estimation, is 55 to 60%. The left ventricle has normal function. The left ventricle has no regional wall motion abnormalities. There is mildly increased left ventricular hypertrophy. Left ventricular diastolic parameters were normal.     Goals      Patient Stated    " I hope to be able to get around better and feel better in a few weeks" (  pt-stated)     CARE PLAN ENTRY (see longitudinal plan of care for additional care plan information)   Current Barriers:   Chronic Disease Management support, education, and care coordination needs related to transportation needs in a patient with CHF and HTN - patient states she has had difficulty ambulating and caring for self due deconditioning related to 3 hospitalizations between December 2020 and January 2021. She states she is currently working with home health to get stronger and more mobile and is hopeful she will  continue to progress.   Case Manager Clinical Goal(s):   Over the next 30 days, patient will work with BSW to address needs related to  transportation issues  in patient with CHF and HTN  Interventions:   Collaborated with BSW to initiate plan of care to address needs related to  transportation  in patient with CHF and HTN  Patient Self Care Activities:   Self administers medications as prescribed  Attends all scheduled provider appointments  Calls pharmacy for medication refills   Initial goal documentation      "I can't get too and from the car right now without help" (pt-stated)     [place] (see longtitudinal plan of care for additional care plan information)  Current Barriers:   Transportation    Social Work Clinical Goal(s):   Over the next 30 days, patient will work with SW to address concerns related to transportation   Interventions:  Patient interviewed and appropriate assessments performed  Collaborated with RN Case Secretary/administrator with [employer] staff to confirm assistance for upcoming appointments.  Completed SCAT application and submitted to eligibility.   Patient Self Care Activities:   Performs ADL's independently  Unable to perform IADLs independently  Initial goal documentation      "I know I'm suppose to weight myself daily but I don't have any scales; my son can't get off work early enough to pick them up at the [place] office before they close" (pt-stated)     [place] (see longitudinal plan of care for additional care plan information)   Current Barriers:   Knowledge deficit related to basic heart failure pathophysiology and self care management  Patient does not have readable scale  Case Manager Clinical Goal(s):   Over the next 30 days, patient will weigh self daily and record  Over the next 30 days, patient will verbalize understanding of Heart Failure Action Plan and when to call  doctor  Over the next 30 days, patient will take all Heart Failure mediations as prescribed  Over the next 30 days, patient will weigh daily and record (notifying MD of 3 lb weight gain over night or 5 lb in a week)  Interventions:   Basic overview and discussion of pathophysiology of Heart Failure reviewed   Provided written education on low sodium, heart healthy diet  Reviewed Heart Failure Action Plan in depth and provided written copy  Assessed need for readable accurate scales in home- RNCM will contact [place] to arrange for scale pick up during business hours  Provided education about placing scale on hard, flat surface  Advised patient to weigh each morning after emptying bladder  Discussed importance of daily weight and advised patient to weigh and record daily  Reviewed role of diuretics in prevention of fluid overload and management of heart failure  Mailed [place] Management spiral calendar and Emmi education articles on : Heart Healthy Diet, Heart Failure- Keeping  Track of your Weight Each Day, Heart Failure and Atrial Fibrillation, Heart Failure and Preserved Ejection Fraction and Heart Failure: When to Call Your Doctor or 911  Patient Self Care Activities:   Takes Heart Failure Medications as prescribed  Weighs daily and record (notifying MD of 3 lb weight gain over night or 5 lb in a week)  Verbalizes understanding of and follows CHF Action Plan  Adheres to low sodium diet, heart healthy diet  Initial goal documentation       Plan:   The care management team will reach out to the patient again over the next 30 days.    Kelli Churn RN, CCM, [facility] RN Care Manager [phone]

## 2019-10-13 NOTE — Patient Instructions (Signed)
Visit Information  Goals Addressed            This Visit's Progress     Patient Stated   . "I know I'm suppose to weight myself daily but I don't have any scales; my son can't get off work early enough to pick them up at the [place] office before they close" (pt-stated)       Erika [name] (see longitudinal plan of care for additional care plan information)   Current Barriers:  [name] Knowledge deficit related to basic heart failure pathophysiology and self care management . Patient does not have readable scale  Case Manager Clinical Goal(s):  [name] Over the next 30 days, patient will weigh self daily and record . Over the next 30 days, patient will verbalize understanding of Heart Failure Action Plan and when to call doctor . Over the next 30 days, patient will take all Heart Failure mediations as prescribed . Over the next 30 days, patient will weigh daily and record (notifying MD of 3 lb weight gain over night or 5 lb in a week)  Interventions:  . Basic overview and discussion of pathophysiology of Heart Failure reviewed  . Provided written education on low sodium, heart healthy diet . Reviewed Heart Failure Action Plan in depth and provided written copy . Assessed need for readable accurate scales in home- RNCM will contact [place] to arrange for scale pick up during business hours . Provided education about placing scale on hard, flat surface . Advised patient to weigh each morning after emptying bladder . Discussed importance of daily weight and advised patient to weigh and record daily . Reviewed role of diuretics in prevention of fluid overload and management of heart failure . Mailed [employer] calendar and Emmi education articles on : Heart Healthy Diet, Heart Failure- Keeping Track of your Weight Each Day, Heart Failure and Atrial Fibrillation, Heart Failure and Preserved Ejection Fraction and Heart Failure: When to  Call Your Doctor or 911  Patient Self Care Activities:  . Takes Heart Failure Medications as prescribed . Weighs daily and record (notifying MD of 3 lb weight gain over night or 5 lb in a week) . Verbalizes understanding of and follows CHF Action Plan . Adheres to low sodium diet, heart healthy diet  Initial goal documentation        The patient verbalized understanding of instructions provided today and declined a print copy of patient instruction materials.   The care management team will reach out to the patient again over the next 30 days.   Kelli Churn RN, CCM, [facility] RN Care Manager [phone]

## 2019-10-14 ENCOUNTER — Telehealth: Payer: Self-pay

## 2019-10-14 ENCOUNTER — Encounter: Payer: Self-pay | Admitting: Cardiology

## 2019-10-14 NOTE — Telephone Encounter (Signed)
Received a message patient needs scales.Spoke to patient's case manager Kelli Churn [phone] she will pick up scales left at [facility] office front desk.

## 2019-10-14 NOTE — Telephone Encounter (Signed)
error 

## 2019-10-15 ENCOUNTER — Ambulatory Visit: Payer: PPO | Admitting: *Deleted

## 2019-10-15 DIAGNOSIS — I10 Essential (primary) hypertension: Secondary | ICD-10-CM

## 2019-10-15 DIAGNOSIS — I5032 Chronic diastolic (congestive) heart failure: Secondary | ICD-10-CM

## 2019-10-15 NOTE — Patient Instructions (Signed)
Visit Information  Goals Addressed            This Visit's Progress     Patient Stated   . "I know I'm suppose to weight myself daily but I don't have any scales; my son can't get off work early enough to pick them up at the [place] office before they close" (pt-stated)       Erika [name] (see longitudinal plan of care for additional care plan information)   Current Barriers:  [name] Knowledge deficit related to basic heart failure pathophysiology and self care management . Patient does not have readable scale  Case Manager Clinical Goal(s):  [name] Over the next 30 days, patient will weigh self daily and record . Over the next 30 days, patient will verbalize understanding of Heart Failure Action Plan and when to call doctor . Over the next 30 days, patient will take all Heart Failure mediations as prescribed . Over the next 30 days, patient will weigh daily and record (notifying MD of 3 lb weight gain over night or 5 lb in a week)  Interventions:  . Basic overview and discussion of pathophysiology of Heart Failure reviewed  . Provided written education on low sodium, heart healthy diet . Reviewed Heart Failure Action Plan in depth and provided written copy . Assessed need for readable accurate scales in home- RNCM will contact [place] to arrange for scale pick up during business hours. Scales picked up from office by this RNCM on 10/14/19 . Provided education about placing scale on hard, flat surface . Advised patient to weigh each morning after emptying bladder . Discussed importance of daily weight and advised patient to weigh and record daily . Reviewed role of diuretics in prevention of fluid overload and management of heart failure . Mailed [employer] calendar and Emmi education articles on : Heart Healthy Diet, Heart Failure- Keeping Track of your Weight Each Day, Heart Failure and Atrial Fibrillation, Heart Failure and  Preserved Ejection Fraction and Heart Failure: When to Call Your Doctor or 911  Patient Self Care Activities:  . Takes Heart Failure Medications as prescribed . Weighs daily and record (notifying MD of 3 lb weight gain over night or 5 lb in a week) . Verbalizes understanding of and follows CHF Action Plan . Adheres to low sodium diet, heart healthy diet  Please see past updates related to this goal by clicking on the "Past Updates" button in the selected goal         The patient verbalized understanding of instructions provided today and declined a print copy of patient instruction materials.   The care management team will reach out to the patient again over the next 14 days.   Kelli Churn RN, CCM, [facility] RN Care Manager [phone]

## 2019-10-15 NOTE — Chronic Care Management (AMB) (Signed)
Chronic Care Management   Follow Up Note   10/15/2019 Name: Erika [name] MRN: [id number] DOB: [birth year]  Referred by: [name], MD Reason for referral : No chief complaint on file.   Erika [name] is a 79 y.o. year old female who is a primary care patient of [name], MD. The CCM team was consulted for assistance with chronic disease management and care coordination needs.    Review of patient status, including review of consultants reports, relevant laboratory and other test results, and collaboration with appropriate care team members and the patient's provider was performed as part of comprehensive patient evaluation and provision of chronic care management services.    Spoke with patient via phone to arrange cardiac scale pickup. She says her son works in [employer] and cannot get to [facility] to pick up scales during business hours. Discussed other options of scale delivery. Patient reports she has been nauseated today and drinking Gatorade, denies fever, increased dyspnea or other symptoms. Advised patient to call internal medicine clinic for urgent visit if symptoms worsen or do not improve.  SDOH (Social Determinants of Health) assessments performed: No See Care Plan activities for detailed interventions related to [facility])     Outpatient Encounter Medications as of 10/15/2019  Medication Sig Note  . ADVAIR DISKUS 500-50 MCG/DOSE AEPB Inhale 1 puff into the lungs 2 (two) times daily.   [name] albuterol (VENTOLIN HFA) 108 (90 Base) MCG/ACT inhaler Inhale 1-2 puffs into the lungs every 6 (six) hours as needed for wheezing or shortness of breath.   . allopurinol (ZYLOPRIM) 300 MG tablet Take 0.5 tablets (150 mg total) by mouth 2 (two) times daily.   . Amino Acids-Protein Hydrolys (FEEDING SUPPLEMENT, PRO-STAT SUGAR FREE 64,) LIQD Take 30 mLs by mouth daily. 08/21/2019: Not on MAR  . amLODipine (NORVASC) 10 MG tablet Take 0.5 tablets (5 mg total) by mouth daily.     [name] apixaban (ELIQUIS) 5 MG TABS tablet Take 1 tablet (5 mg total) by mouth 2 (two) times daily.   [name] aspirin EC 81 MG tablet Take 1 tablet (81 mg total) by mouth daily.   . Ensure Max Protein (ENSURE MAX PROTEIN) LIQD Take 330 mLs (11 oz total) by mouth 2 (two) times daily. (Patient not taking: Reported on 10/13/2019)   . fluticasone (FLONASE) 50 MCG/ACT nasal spray Place 1 spray into both nostrils daily.   . furosemide (LASIX) 40 MG tablet Take 1 tablet (40 mg total) by mouth daily.   . metoprolol tartrate (LOPRESSOR) 50 MG tablet Take 1 tablet (50 mg total) by mouth 2 (two) times daily.   [name] nystatin (MYCOSTATIN/NYSTOP) powder Apply topically 2 (two) times daily. (Patient not taking: Reported on 10/13/2019)   . omeprazole (PRILOSEC) 20 MG capsule Take 1 capsule (20 mg total) by mouth daily.   . potassium chloride (KLOR-CON) 10 MEQ tablet Take 2 tablets (20 mEq total) by mouth daily for 5 days.   . pravastatin (PRAVACHOL) 20 MG tablet Take 1 tablet (20 mg total) by mouth daily.   . QUEtiapine (SEROQUEL) 25 MG tablet Take 0.5 tablets (12.5 mg total) by mouth at bedtime.   . sodium chloride (OCEAN) 0.65 % SOLN nasal spray Place 1 spray into both nostrils as needed for congestion.    No facility-administered encounter medications on file as of 10/15/2019.     Objective:   Goals Addressed            This Visit's Progress  Patient Stated   . "I know I'm suppose to weight myself daily but I don't have any scales; my son can't get off work early enough to pick them up at the [place] office before they close" (pt-stated)       [place] (see longitudinal plan of care for additional care plan information)   Current Barriers:  [name] Knowledge deficit related to basic heart failure pathophysiology and self care management . Patient does not have readable scale  Case Manager Clinical Goal(s):  [name] Over the next 30 days, patient will weigh self daily and record . Over the next 30 days,  patient will verbalize understanding of Heart Failure Action Plan and when to call doctor . Over the next 30 days, patient will take all Heart Failure mediations as prescribed . Over the next 30 days, patient will weigh daily and record (notifying MD of 3 lb weight gain over night or 5 lb in a week)  Interventions:  . Basic overview and discussion of pathophysiology of Heart Failure reviewed  . Provided written education on low sodium, heart healthy diet . Reviewed Heart Failure Action Plan in depth and provided written copy . Assessed need for readable accurate scales in home- RNCM will contact [place] to arrange for scale pick up during business hours. Scales picked up from office by this RNCM on 10/14/19 . Provided education about placing scale on hard, flat surface . Advised patient to weigh each morning after emptying bladder . Discussed importance of daily weight and advised patient to weigh and record daily . Reviewed role of diuretics in prevention of fluid overload and management of heart failure . Mailed [employer] calendar and Emmi education articles on : Heart Healthy Diet, Heart Failure- Keeping Track of your Weight Each Day, Heart Failure and Atrial Fibrillation, Heart Failure and Preserved Ejection Fraction and Heart Failure: When to Call Your Doctor or 911  Patient Self Care Activities:  . Takes Heart Failure Medications as prescribed . Weighs daily and record (notifying MD of 3 lb weight gain over night or 5 lb in a week) . Verbalizes understanding of and follows CHF Action Plan . Adheres to low sodium diet, heart healthy diet  Please see past updates related to this goal by clicking on the "Past Updates" button in the selected goal          Plan:   Telephone follow up appointment with care management team member scheduled for: 10/27/19 at 3:30 pm   Kelli Churn RN, CCM, [facility] RN Care  Manager [phone]

## 2019-10-21 ENCOUNTER — Ambulatory Visit: Payer: PPO | Admitting: *Deleted

## 2019-10-21 DIAGNOSIS — I10 Essential (primary) hypertension: Secondary | ICD-10-CM

## 2019-10-21 DIAGNOSIS — I5032 Chronic diastolic (congestive) heart failure: Secondary | ICD-10-CM

## 2019-10-21 DIAGNOSIS — I480 Paroxysmal atrial fibrillation: Secondary | ICD-10-CM

## 2019-10-21 NOTE — Chronic Care Management (AMB) (Signed)
Chronic Care Management   Follow Up Note   10/21/2019 Name: Erika [name] MRN: [id number] DOB: [birth year]  Referred by: [name], MD Reason for referral : Chronic Care Management (HF, HTN, A Fib, asthma)   Erika [name] is a 79 y.o. year old female who is a primary care patient of [name], MD. The CCM team was consulted for assistance with chronic disease management and care coordination needs.    Review of patient status, including review of consultants reports, relevant laboratory and other test results, and collaboration with appropriate care team members and the patient's provider was performed as part of comprehensive patient evaluation and provision of chronic care management services.     SDOH (Social Determinants of Health) assessments performed: No See Care Plan activities for detailed interventions related to [facility])     Outpatient Encounter Medications as of 10/21/2019  Medication Sig Note  . ADVAIR DISKUS 500-50 MCG/DOSE AEPB Inhale 1 puff into the lungs 2 (two) times daily.   [name] albuterol (VENTOLIN HFA) 108 (90 Base) MCG/ACT inhaler Inhale 1-2 puffs into the lungs every 6 (six) hours as needed for wheezing or shortness of breath.   . allopurinol (ZYLOPRIM) 300 MG tablet Take 0.5 tablets (150 mg total) by mouth 2 (two) times daily.   . Amino Acids-Protein Hydrolys (FEEDING SUPPLEMENT, PRO-STAT SUGAR FREE 64,) LIQD Take 30 mLs by mouth daily. 08/21/2019: Not on MAR  . amLODipine (NORVASC) 10 MG tablet Take 0.5 tablets (5 mg total) by mouth daily.   [name] apixaban (ELIQUIS) 5 MG TABS tablet Take 1 tablet (5 mg total) by mouth 2 (two) times daily.   [name] aspirin EC 81 MG tablet Take 1 tablet (81 mg total) by mouth daily.   . Ensure Max Protein (ENSURE MAX PROTEIN) LIQD Take 330 mLs (11 oz total) by mouth 2 (two) times daily. (Patient not taking: Reported on 10/13/2019)   . fluticasone (FLONASE) 50 MCG/ACT nasal spray Place 1 spray into both nostrils daily.   . furosemide  (LASIX) 40 MG tablet Take 1 tablet (40 mg total) by mouth daily.   . metoprolol tartrate (LOPRESSOR) 50 MG tablet Take 1 tablet (50 mg total) by mouth 2 (two) times daily.   [name] nystatin (MYCOSTATIN/NYSTOP) powder Apply topically 2 (two) times daily. (Patient not taking: Reported on 10/13/2019)   . omeprazole (PRILOSEC) 20 MG capsule Take 1 capsule (20 mg total) by mouth daily.   . potassium chloride (KLOR-CON) 10 MEQ tablet Take 2 tablets (20 mEq total) by mouth daily for 5 days.   . pravastatin (PRAVACHOL) 20 MG tablet Take 1 tablet (20 mg total) by mouth daily.   . QUEtiapine (SEROQUEL) 25 MG tablet Take 0.5 tablets (12.5 mg total) by mouth at bedtime.   . sodium chloride (OCEAN) 0.65 % SOLN nasal spray Place 1 spray into both nostrils as needed for congestion.    No facility-administered encounter medications on file as of 10/21/2019.     Objective:   Goals Addressed            This Visit's Progress     Patient Stated   . "I know I'm suppose to weight myself daily but I don't have any scales; my son can't get off work early enough to pick them up at the cardiologist office before they close" (pt-stated)       [place] (see longitudinal plan of care for additional care plan information)   Current Barriers:  [name] Knowledge deficit related  to basic heart failure pathophysiology and self care management . Patient does not have readable scale and depends on son who works 2 jobs to provide transportation and pick up scales which he has not been able to do. Patient also c/o nausea x  4 days- says she thinks it's one of her medications and she stopped taking the Seroquel because she thought it was causing the nausea.  Case Manager Clinical Goal(s):  [name] Over the next 30 days, patient will weigh self daily and record . Over the next 30 days, patient will verbalize understanding of Heart Failure Action Plan and when to call doctor . Over the next 30 days, patient will take all Heart Failure  mediations as prescribed . Over the next 30 days, patient will weigh daily and record (notifying MD of 3 lb weight gain over night or 5 lb in a week)  Interventions:  . Basic overview and discussion of pathophysiology of Heart Failure reviewed  . Provided written education on low sodium, heart healthy diet . Reviewed Heart Failure Action Plan in depth and provided written copy . Assessed need for readable accurate scales in home- called patient on 10/21/19 and advised her this RNCM will bring scales to the [facility] on  10/28/19 when she has her appointment with the oncologist. . Provided education about placing scale on hard, flat surface . Advised patient to weigh each morning after emptying bladder . Discussed importance of daily weight and advised patient to weigh and record daily . Reviewed role of diuretics in prevention of fluid overload and management of heart failure . Mailed [employer] calendar and Emmi education articles on : Heart Healthy Diet, Heart Failure- Keeping Track of your Weight Each Day, Heart Failure and Atrial Fibrillation, Heart Failure and Preserved Ejection Fraction and Heart Failure: When to Call Your Doctor or 911 . 10/21/19 Encouraged her to call the clinic to arrange an acute visit since her nausea has not subsided and she may need her medications adjusted and the prolonged nausea further investigated.   Patient Self Care Activities:  . Takes Heart Failure Medications as prescribed . Weighs daily and record (notifying MD of 3 lb weight gain over night or 5 lb in a week) . Verbalizes understanding of and follows CHF Action Plan . Adheres to low sodium diet, heart healthy diet  Please see past updates related to this goal by clicking on the "Past Updates" button in the selected goal          Plan:   The care management team will reach out to the patient again over the next 7 days.    Kelli Churn RN, CCM,  [facility] RN Care Manager [phone]

## 2019-10-21 NOTE — Patient Instructions (Signed)
Visit Information 1) Please call the clinic to arrange an acute visit if your nausea continues.  Goals Addressed            This Visit's Progress     Patient Stated   . "I know I'm suppose to weight myself daily but I don't have any scales; my son can't get off work early enough to pick them up at the [place] office before they close" (pt-stated)       [place] (see longitudinal plan of care for additional care plan information)   Current Barriers:  [name] Knowledge deficit related to basic heart failure pathophysiology and self care management . Patient does not have readable scale and depends on son who works 2 jobs to provide transportation and pick up scales which he has not been able to do. Patient also c/o nausea  x 4 days- says she thinks it's one of her medications and she stopped taking the Seroquel because she thought it was causing the nausea.  Case Manager Clinical Goal(s):  [name] Over the next 30 days, patient will weigh self daily and record . Over the next 30 days, patient will verbalize understanding of Heart Failure Action Plan and when to call doctor . Over the next 30 days, patient will take all Heart Failure mediations as prescribed . Over the next 30 days, patient will weigh daily and record (notifying MD of 3 lb weight gain over night or 5 lb in a week)  Interventions:  . Basic overview and discussion of pathophysiology of Heart Failure reviewed  . Provided written education on low sodium, heart healthy diet . Reviewed Heart Failure Action Plan in depth and provided written copy . Assessed need for readable accurate scales in home- called patient on 10/21/19 and advised her this RNCM will bring scales to the [facility] on  10/28/19 when she has her appointment with the oncologist. . Provided education about placing scale on hard, flat surface . Advised patient to weigh each morning after emptying bladder . Discussed importance of daily weight and advised  patient to weigh and record daily . Reviewed role of diuretics in prevention of fluid overload and management of heart failure . Mailed [employer] calendar and Emmi education articles on : Heart Healthy Diet, Heart Failure- Keeping Track of your Weight Each Day, Heart Failure and Atrial Fibrillation, Heart Failure and Preserved Ejection Fraction and Heart Failure: When to Call Your Doctor or 911 . 10/21/19 Encouraged her to call the clinic for an acute appointment since her nausea has not subsided and she may need her medications adjusted and the prolonged nausea further investigated.   Patient Self Care Activities:  . Takes Heart Failure Medications as prescribed . Weighs daily and record (notifying MD of 3 lb weight gain over night or 5 lb in a week) . Verbalizes understanding of and follows CHF Action Plan . Adheres to low sodium diet, heart healthy diet  Please see past updates related to this goal by clicking on the "Past Updates" button in the selected goal         The patient verbalized understanding of instructions provided today and declined a print copy of patient instruction materials.   The care management team will reach out to the patient again over the next 7 days.   Kelli Churn RN, CCM, [facility] RN Care Manager [phone]

## 2019-10-22 NOTE — Progress Notes (Signed)
Internal Medicine Clinic Resident   I have personally reviewed this encounter including the documentation in this note and/or discussed this patient with the care management provider. I will address any urgent items identified by the care management provider and will communicate my actions to the patient's PCP. I have reviewed the patient's CCM visit with my supervising attending.  I have sent message to front desk to schedule patient for [facility] appointment to evaluate nausea  Jeanmarie Hubert, MD 10/22/2019

## 2019-10-22 NOTE — Progress Notes (Signed)
Internal Medicine Clinic Attending  CCM services provided by the care management provider and their documentation were discussed with Dr. Benjamine Mola soon after the resident saw the patient.  We reviewed the pertinent findings, urgent action items addressed by the resident and non-urgent items to be addressed by the PCP.  I agree with the assessment, diagnosis, and plan of care documented in the CCM and resident's note.  Gilles Chiquito, MD 10/22/2019

## 2019-10-22 NOTE — Progress Notes (Signed)
Internal Medicine Clinic Attending  CCM services provided by the care management provider and their documentation were discussed with Dr. Benjamine Mola soon after review of the CCM note.  We reviewed the pertinent findings, urgent action items addressed by the resident and non-urgent items to be addressed by the PCP.  I agree with the assessment, diagnosis, and plan of care documented in the CCM and resident's note.  Larey Dresser, MD 10/22/2019

## 2019-10-23 ENCOUNTER — Other Ambulatory Visit: Payer: Self-pay

## 2019-10-23 ENCOUNTER — Ambulatory Visit: Payer: PPO | Admitting: Internal Medicine

## 2019-10-23 DIAGNOSIS — R110 Nausea: Secondary | ICD-10-CM

## 2019-10-23 NOTE — Progress Notes (Signed)
Attempted to call X 2.  No answer at given number.    She will follow up next week in [facility].    Gilles Chiquito, MD

## 2019-10-24 DIAGNOSIS — R531 Weakness: Secondary | ICD-10-CM | POA: Diagnosis not present

## 2019-10-24 DIAGNOSIS — I5033 Acute on chronic diastolic (congestive) heart failure: Secondary | ICD-10-CM | POA: Diagnosis not present

## 2019-10-24 DIAGNOSIS — R4182 Altered mental status, unspecified: Secondary | ICD-10-CM | POA: Diagnosis not present

## 2019-10-25 DIAGNOSIS — I509 Heart failure, unspecified: Secondary | ICD-10-CM | POA: Diagnosis not present

## 2019-10-26 ENCOUNTER — Other Ambulatory Visit: Payer: Self-pay | Admitting: Internal Medicine

## 2019-10-27 ENCOUNTER — Ambulatory Visit: Payer: PPO | Admitting: *Deleted

## 2019-10-27 DIAGNOSIS — I480 Paroxysmal atrial fibrillation: Secondary | ICD-10-CM

## 2019-10-27 DIAGNOSIS — I5032 Chronic diastolic (congestive) heart failure: Secondary | ICD-10-CM

## 2019-10-27 DIAGNOSIS — I10 Essential (primary) hypertension: Secondary | ICD-10-CM

## 2019-10-27 NOTE — Patient Instructions (Signed)
Visit Information It was nice speaking with you today and I am glad your nausea has resolved. Don't forget to pick up your cardiac scales at the cancer center tomorrow. Begin weighing every morning after you urinate and write down your weight in the calendar I sent you in the mail on the weight log.   Goals Addressed            This Visit's Progress     Patient Stated   . " I hope to be able to get around better and feel better in a few weeks" (pt-stated)       Erika [name] (see longitudinal plan of care for additional care plan information)   Current Barriers:  . Chronic Disease Management support, education, and care coordination needs related to transportation needs in a patient with CHF and HTN - patient states she feels better and is stronger and feels well enough to attend oncology appointment at [facility] on 10/28/19  Case Manager Clinical Goal(s):  [name] Over the next 30 days, patient will work with BSW to address needs related to  transportation issues  in patient with CHF and HTN  Interventions:  . Collaborated with BSW to initiate plan of care to address needs related to  transportation  in patient with CHF and HTN- application for [place] completed and submitted by clinic BSW on 10/12/19  Patient Self Care Activities:  . Self administers medications as prescribed . Attends all scheduled provider appointments . Calls pharmacy for medication refills   Please see past updates related to this goal by clicking on the "Past Updates" button in the selected goal      . "I can't get too and from the car right now without help" (pt-stated)       Erika [name] (see longitudinal plan of care for additional care plan information)  Current Barriers:  . Transportation    Social Work Clinical Goal(s):  [name] Over the next 30 days, patient will work with SW to address concerns related to transportation   Interventions: . Patient interviewed and appropriate assessments  performed . Collaborated with RN Case Freight forwarder  . Collaborated with [facility] [employer] staff to confirm assistance for upcoming appointments. . Completed [employer]AT application and submitted to eligibility.  . 10/27/19- RNCM notified [facility] social worker that [place] application has been completed and submitted by [place] [facility] [place] social worker so that appointment tomorrow with cancer center social worker to complete SCAT application can be cancelled  Patient Self Care Activities:  . Performs ADL's independently . Unable to perform IADLs independently  Please see past updates related to this goal by clicking on the "Past Updates" button in the selected goal      . "I know I'm suppose to weight myself daily but I don't have any scales; my son can't get off work early enough to pick them up at the [place] office before they close" (pt-stated)       Erika [name] (see longitudinal plan of care for additional care plan information)   Current Barriers:  [name] Knowledge deficit related to basic heart failure pathophysiology and self care management . Patient does not have readable scale and depends on son who works 2 jobs to provide transportation and pick up scales which he has not been able to do. Called patient 10/27/19- She states she feels much better. Nausea has resolved. She verifies she will attend the appointment at the [facility] with  the oncologist tomorrow at 12 noon. She says [place] is to provider her transportation. (her [employer] transportation is pending approval- a completed application was submitted on 10/12/19)   Case Manager Clinical Goal(s):  [name] Over the next 30 days, patient will weigh self daily and record . Over the next 30 days, patient will verbalize understanding of Heart Failure Action Plan and when to call doctor . Over the next 30 days, patient will take all Heart Failure mediations as prescribed . Over the next 30 days, patient will weigh  daily and record (notifying MD of 3 lb weight gain over night or 5 lb in a week)  Interventions:  . Basic overview and discussion of pathophysiology of Heart Failure reviewed  . Provided written education on low sodium, heart healthy diet . Reviewed Heart Failure Action Plan in depth and provided written copy . Assessed need for readable accurate scales in home- called patient on 10/27/19 and advised her this RNCM took cardiac scales to the [facility] and [place] at the front desk will give to Dr Grier Mitts nurse to give to patient at her appointment on 10/28/19. [name] Provided education about placing scale on hard, flat surface . Advised patient to weigh each morning after emptying bladder . Discussed importance of daily weight and advised patient to weigh and record daily . Reviewed role of diuretics in prevention of fluid overload and management of heart failure Patient Self Care Activities:  . Takes Heart Failure Medications as prescribed . Weighs daily and record (notifying MD of 3 lb weight gain over night or 5 lb in a week) . Verbalizes understanding of and follows CHF Action Plan . Adheres to low sodium diet, heart healthy diet  Please see past updates related to this goal by clicking on the "Past Updates" button in the selected goal         The patient verbalized understanding of instructions provided today and declined a print copy of patient instruction materials.   The care management team will reach out to the patient again over the next 14 days.   Kelli Churn RN, CCM, [facility] RN Care Manager [phone]

## 2019-10-27 NOTE — Chronic Care Management (AMB) (Signed)
Chronic Care Management   Follow Up Note   10/27/2019 Name: Erika [name] MRN: [id number] DOB: 18-Apr-1941  Referred by: [name], MD Reason for referral : No chief complaint on file.   Erika [name] is a 79 y.o. year old female who is a primary care patient of [name], MD. The CCM team was consulted for assistance with chronic disease management and care coordination needs.    Review of patient status, including review of consultants reports, relevant laboratory and other test results, and collaboration with appropriate care team members and the patient's provider was performed as part of comprehensive patient evaluation and provision of chronic care management services.    SDOH (Social Determinants of Health) assessments performed: No See Care Plan activities for detailed interventions related to [facility])     Outpatient Encounter Medications as of 10/27/2019  Medication Sig Note  . ADVAIR DISKUS 500-50 MCG/DOSE AEPB Inhale 1 puff into the lungs 2 (two) times daily.   [name] albuterol (VENTOLIN HFA) 108 (90 Base) MCG/ACT inhaler Inhale 1-2 puffs into the lungs every 6 (six) hours as needed for wheezing or shortness of breath.   . allopurinol (ZYLOPRIM) 300 MG tablet Take 0.5 tablets (150 mg total) by mouth 2 (two) times daily.   . Amino Acids-Protein Hydrolys (FEEDING SUPPLEMENT, PRO-STAT SUGAR FREE 64,) LIQD Take 30 mLs by mouth daily. 08/21/2019: Not on MAR  . amLODipine (NORVASC) 10 MG tablet Take 0.5 tablets (5 mg total) by mouth daily.   [name] apixaban (ELIQUIS) 5 MG TABS tablet Take 1 tablet (5 mg total) by mouth 2 (two) times daily.   [name] aspirin EC 81 MG tablet Take 1 tablet (81 mg total) by mouth daily.   . Ensure Max Protein (ENSURE MAX PROTEIN) LIQD Take 330 mLs (11 oz total) by mouth 2 (two) times daily. (Patient not taking: Reported on 10/13/2019)   . fluticasone (FLONASE) 50 MCG/ACT nasal spray Place 1 spray into both nostrils daily.   . furosemide (LASIX) 40 MG tablet  TAKE 1 TABLET(40 MG) BY MOUTH DAILY   . metoprolol tartrate (LOPRESSOR) 50 MG tablet Take 1 tablet (50 mg total) by mouth 2 (two) times daily.   [name] nystatin (MYCOSTATIN/NYSTOP) powder Apply topically 2 (two) times daily. (Patient not taking: Reported on 10/13/2019)   . omeprazole (PRILOSEC) 20 MG capsule Take 1 capsule (20 mg total) by mouth daily.   . potassium chloride (KLOR-CON) 10 MEQ tablet Take 2 tablets (20 mEq total) by mouth daily for 5 days.   . pravastatin (PRAVACHOL) 20 MG tablet Take 1 tablet (20 mg total) by mouth daily.   . QUEtiapine (SEROQUEL) 25 MG tablet Take 0.5 tablets (12.5 mg total) by mouth at bedtime.   . sodium chloride (OCEAN) 0.65 % SOLN nasal spray Place 1 spray into both nostrils as needed for congestion.    No facility-administered encounter medications on file as of 10/27/2019.     Objective:  BP Readings from Last 3 Encounters:  08/27/19 132/66  08/05/19 133/62  07/28/19 (!) 151/70    Wt Readings from Last 3 Encounters:  08/27/19 268 lb 15.4 oz (122 kg)  08/01/19 269 lb 6.4 oz (122.2 kg)  07/13/19 299 lb 13.2 oz (136 kg)    Goals Addressed            This Visit's Progress     Patient Stated   . " I hope to be able to get around better and feel better in a few weeks" (  pt-stated)       CARE PLAN ENTRY (see longitudinal plan of care for additional care plan information)   Current Barriers:  . Chronic Disease Management support, education, and care coordination needs related to transportation needs in a patient with CHF and HTN - patient states she feels better and is stronger and feels well enough to attend oncology appointment at [facility] on 10/28/19  Case Manager Clinical Goal(s):  [name] Over the next 30 days, patient will work with BSW to address needs related to  transportation issues  in patient with CHF and HTN  Interventions:  . Collaborated with BSW to initiate plan of care to address needs related to  transportation  in  patient with CHF and HTN- application for [place] completed and submitted by clinic BSW on 10/12/19  Patient Self Care Activities:  . Self administers medications as prescribed . Attends all scheduled provider appointments . Calls pharmacy for medication refills   Please see past updates related to this goal by clicking on the "Past Updates" button in the selected goal      . "I can't get too and from the car right now without help" (pt-stated)       [place] (see longitudinal plan of care for additional care plan information)  Current Barriers:  . Transportation    Social Work Clinical Goal(s):  [name] Over the next 30 days, patient will work with SW to address concerns related to transportation   Interventions: . Patient interviewed and appropriate assessments performed . Collaborated with RN Case Freight forwarder  . Collaborated with [facility] [employer] staff to confirm assistance for upcoming appointments. . Completed SCAT application and submitted to eligibility.  . 10/27/19- RNCM notified [facility] social worker that May application has been completed and submitted by [facility] [place] social worker so that appointment tomorrow with [facility] center social worker to complete SCAT application can be cancelled  Patient Self Care Activities:  . Performs ADL's independently . Unable to perform IADLs independently  Please see past updates related to this goal by clicking on the "Past Updates" button in the selected goal      . "I know I'm suppose to weight myself daily but I don't have any scales; my son can't get off work early enough to pick them up at the cardiologist office before they close" (pt-stated)       [place] (see longitudinal plan of care for additional care plan information)   Current Barriers:  [name] Knowledge deficit related to basic heart failure pathophysiology and self care management . Patient does not have readable scale and depends on son  who works 2 jobs to provide transportation and pick up scales which he has not been able to do. Called patient 10/27/19- She states she feels much better. Nausea has resolved. She verifies she will attend the appointment at the [facility] with the oncologist tomorrow at 12 noon. She says [employer] is to provider her transportation. (her SCAT transportation is pending approval- a completed application was submitted on 10/12/19)   Case Manager Clinical Goal(s):  [name] Over the next 30 days, patient will weigh self daily and record . Over the next 30 days, patient will verbalize understanding of Heart Failure Action Plan and when to call doctor . Over the next 30 days, patient will take all Heart Failure mediations as prescribed . Over the next 30 days, patient will weigh daily and record (notifying MD of  3 lb weight gain over night or 5 lb in a week)  Interventions:  . Basic overview and discussion of pathophysiology of Heart Failure reviewed  . Provided written education on low sodium, heart healthy diet . Reviewed Heart Failure Action Plan in depth and provided written copy . Assessed need for readable accurate scales in home- called patient on 10/27/19 and advised her this RNCM took cardiac scales to the [facility] and [place] at the front desk will give to Dr Grier Mitts nurse to give to patient at her appointment on 10/28/19. [name] Provided education about placing scale on hard, flat surface . Advised patient to weigh each morning after emptying bladder . Discussed importance of daily weight and advised patient to weigh and record daily . Reviewed role of diuretics in prevention of fluid overload and management of heart failure Patient Self Care Activities:  . Takes Heart Failure Medications as prescribed . Weighs daily and record (notifying MD of 3 lb weight gain over night or 5 lb in a week) . Verbalizes understanding of and follows CHF Action Plan . Adheres to low sodium diet, heart healthy  diet  Please see past updates related to this goal by clicking on the "Past Updates" button in the selected goal        Plan:   The care management team will reach out to the patient again over the next 14 days.    Kelli Churn RN, CCM, [facility] RN Care Manager [phone]

## 2019-10-27 NOTE — Progress Notes (Signed)
HEMATOLOGY/ONCOLOGY CLINIC NOTE  Date of Service: 10/28/19    Inpatient Attending: .Brunetta Genera, MD  CC:  F/u MPN NOS chek2 mutation +ve  HPI  Patient is a very pleasant 79 year old female with a history of morbid obesity, anemia, asthma, hypertension, lumbar spinal TB in 2002 who presented with acute onset of lower back pain on Monday. She reports that she drank some pepsi and tea on that day and her back pain began shortly after. No initial injury to precipitate her back pain. It gradually worsened until she was seen in the ED on 05/30/17. Labs during admission revealed WBC count of 55.2k with predominantly neutrophilia of 48.8k and some monocytosis at 2.2k.   She had minimal anemia at 11.5 with an MCV of 92 and a normal platelet count of 163k.  At that time she had CT A/P while in the ED which was normal and without splenomegaly. Additionally, she also had MRI of the L-spine which only showed degenerative changes. She was subsequently admitted for further workup and pain control and to rule out a source of infection.   Of note, the patient has a remote prior h/o epidural mass in 2002 (hemilaminectomy and decompression of tumor in epidural space 2002) which was thought to be secondary to lumbar epidural tuberculosis. She was placed on a course of antibiotics for several months following her diagnosis.   Following her admission, we were consulted for further workup. No recent new medications. She reports that her inhaler does contain steroids, but otherwise she has not been on a course of steroids recently. She is UTD with her regular cancer screenings. She does not feel any different now than she did 35moto a year ago.  No focal symptoms suggestive of infection other than the back pain at this time.  On ROS, she denies fever, chills, night sweats, unexpected weight loss, abdominal pain, sore throat, joint swelling, or any other associated symptoms. Her back pain is much improved  with muscle relaxants. She does note some right great toe pain, but otherwise she has no other acute symptoms.   INTERVAL HISTORY:   Erika KILLOUGHis here for follow up of persistent leucocytosis/neutrophilia.consistent with chronic MPN NOS with Chek2 mutation. The patient's last visit with [facility] on 10/07/2018. The pt reports that she is doing well overall.  The pt reports she was in the hospital in January for chronic diastolic heart failure. She currently has home PT a few times per week. Pt has progressed being able to walk with a walker. Pt lives at home with her son. Her back pain and leg swelling is stable at this time. Her breathing has been okay, considering that she has asthma. Pt believes that being able to walk around is helping her breathing.   Pt stopped taking Seroquel as it was causing severe dry mouth. She denies any issues sleeping. Pt has also discontinued taking Allopurinol at this time. She states that she was not given a refill on for that medication. She is scheduled to see her PCP on Friday.   Pt has not been able to set up an appointment to receive the COVID19 vaccine yet.   Lab results today (10/28/19) of CBC w/diff is as follows: all values are WNL except for WBC at 117.3K, RBC at 2.55, Hgb at 7.6, HCT at 24.3, RDW at 16.1, Neutro Abs at 100.1K, Mono Abs at 2.3K, Abs Immature Granulocytes at 11.33K, WBC Morphology shows "Toxic Granulation".  On review of systems, pt  reports back pain, leg swelling and denies fevers, chills, night sweats, bone pain, headaches, change in vision, SOB, fatigue, lightheadedness, dizziness, sleeplessness, abdominal pain, constipation and any other symptoms.    MEDICAL HISTORY:  Past Medical History:  Diagnosis Date  . Anemia    with menses  . Asthma   . Atrial flutter, paroxysmal ([place])   . Back pain    status post surgery 2002  . Breast cyst    Excesion with FNA, begnin in 2004.   . Degenerative joint disease of spine    Imaging  2005,  Degenerative hypertrophic facet arthritis changes L4-5 and L5-S1..   . History of shingles    Recurrent with post herpetic neuralgia.   [name] Hypertension   . Lymphadenopathy    Of the mediastinum, Right side CXR 2008, not read on 2010 cxr.   . Menopause   . Obesity    BMI 54  . Psychosis ([place])    Secondary to prednisone.  . Shingles   . Stasis dermatitis    W/ LE edema, prviously on lasix now on mazxide.   . SVT (supraventricular tachycardia) [facility]) June 2009   one run while hospitalized  . Tuberculosis    active TB treated in 2002, hx of paraspinal lumbar TB,     SURGICAL HISTORY: Past Surgical History:  Procedure Laterality Date  . BREAST CYST ASPIRATION Left   . Breast cyst biopsy    . CHOLECYSTECTOMY  02/02/2012   Procedure: LAPAROSCOPIC CHOLECYSTECTOMY;  Surgeon: Zenovia Jarred, MD;  Location: [facility];  Service: General;  Laterality: N/A;  . Hemilaminectomy of L4, L5 and S1 decompression of tumor in epidural space  2000  . IR FLUORO GUIDED NEEDLE PLC ASPIRATION/INJECTION LOC  07/17/2019    SOCIAL HISTORY: Social History   Socioeconomic History  . Marital status: Widowed    Spouse name: Not on file  . Number of children: Not on file  . Years of education: Not on file  . Highest education level: Not on file  Occupational History  . 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 Smoking status: Never Smoker  . Smokeless tobacco: Never Used  Substance and Sexual Activity  . Alcohol use: No    Alcohol/week: 0.0 standard drinks  . Drug use: No  . Sexual activity: Not Currently    Birth control/protection: Post-menopausal  Other Topics Concern  . Not on file  Social History Narrative   Current Social History 03/20/2019        Patient lives alone in a ground floor apartment which is 1 story. There are not steps up to the entrance the patient uses. One son stays with her once in a while.      Patient's  method of transportation is personal car.      The highest level of education was some high school: 11th grade.      The patient currently retired.      Identified important Relationships are "My friend Mart Piggs, my nieces, my grandkids, a lot of people.       Pets : None       Interests / Fun: "Playing word games on my phone, watch TV, Talking on phone."       Current Stressors: "Nothing, really. 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 Difficulty of Paying Living Expenses: Not very hard  Food Insecurity: No Food Insecurity  . Worried About Charity fundraiser in the Last Year: Never true  . Ran Out of Food in the Last Year: Never true  Transportation Needs: Unmet Transportation Needs  . Lack of Transportation (Medical): Yes  . Lack of Transportation (Non-Medical): No  Physical Activity:   . Days of Exercise per Week:   . Minutes of Exercise per Session:   Stress:   . Feeling of Stress :   Social Connections:   . Frequency of Communication with Friends and Family:   . Frequency of Social Gatherings with Friends and Family:   . Attends Religious Services:   . Active Member of Clubs or Organizations:   . Attends Archivist Meetings:   [name] Marital Status:   Intimate Partner Violence:   . Fear of Current or Ex-Partner:   . Emotionally Abused:   [name] Physically Abused:   . Sexually Abused:     FAMILY HISTORY: Family History  Problem Relation Age of Onset  . Stroke Mother        at young age  . Heart disease Mother   . Heart attack Father   . Heart disease Father   . Cancer Sister        Unknown  . Stroke Brother   . Stroke Brother   . Breast cancer Other   . Sickle cell anemia Brother   . Diabetes Brother   . Stroke Brother     ALLERGIES:  is allergic to prednisone.  MEDICATIONS:  . Current  Outpatient Medications:  .  ADVAIR DISKUS 500-50 MCG/DOSE AEPB, Inhale 1 puff into the lungs 2 (two) times daily., Disp: 180 each, Rfl: 3 .  albuterol (VENTOLIN HFA) 108 (90 Base) MCG/ACT inhaler, Inhale 1-2 puffs into the lungs every 6 (six) hours as needed for wheezing or shortness of breath., Disp: 18 g, Rfl: 11 .  allopurinol (ZYLOPRIM) 300 MG tablet, Take 0.5 tablets (150 mg total) by mouth 2 (two) times daily., Disp: 30 tablet, Rfl: 0 .  Amino Acids-Protein Hydrolys (FEEDING SUPPLEMENT, PRO-STAT SUGAR FREE 64,) LIQD, Take 30 mLs by mouth daily., Disp: 887 mL, Rfl: 0 .  amLODipine (NORVASC) 10 MG tablet, Take 0.5 tablets (5 mg total) by mouth daily., Disp: 90 tablet, Rfl: 3 .  apixaban (ELIQUIS) 5 MG TABS tablet, Take 1 tablet (5 mg total) by mouth 2 (two) times daily., Disp: 60 tablet, Rfl: 11 .  aspirin EC 81 MG tablet, Take 1 tablet (81 mg total) by mouth daily., Disp: 90 tablet, Rfl: 3 .  Ensure Max Protein (ENSURE MAX PROTEIN) LIQD, Take 330 mLs (11 oz total) by mouth 2 (two) times daily. (Patient not taking: Reported on 10/13/2019), Disp:  , Rfl:  .  fluticasone (FLONASE) 50 MCG/ACT nasal spray, Place 1 spray into both nostrils daily., Disp: 16 g, Rfl: 3 .  furosemide (LASIX) 40 MG tablet, TAKE 1 TABLET(40 MG) BY MOUTH DAILY, Disp: 90 tablet, Rfl: 1 .  metoprolol tartrate (LOPRESSOR) 50 MG tablet, Take 1 tablet (50 mg total) by mouth 2 (two) times daily., Disp: 60 tablet, Rfl: 11 .  nystatin (MYCOSTATIN/NYSTOP) powder, Apply topically 2 (two) times daily. (Patient not taking: Reported on 10/13/2019), Disp: 15 g, Rfl: 3 .  omeprazole (PRILOSEC) 20 MG capsule, Take 1 capsule (20 mg total) by mouth  daily., Disp: 90 capsule, Rfl: 3 .  potassium chloride (KLOR-CON) 10 MEQ tablet, Take 2 tablets (20 mEq total) by mouth daily for 5 days., Disp: 10 tablet, Rfl: 0 .  pravastatin (PRAVACHOL) 20 MG tablet, Take 1 tablet (20 mg total) by mouth daily., Disp: 90 tablet, Rfl: 3 .  QUEtiapine (SEROQUEL) 25 MG  tablet, Take 0.5 tablets (12.5 mg total) by mouth at bedtime., Disp: 15 tablet, Rfl: 11 .  sodium chloride (OCEAN) 0.65 % SOLN nasal spray, Place 1 spray into both nostrils as needed for congestion., Disp: 88 mL, Rfl: 0   REVIEW OF SYSTEMS:   A 10+ POINT REVIEW OF SYSTEMS WAS OBTAINED including neurology, dermatology, psychiatry, cardiac, respiratory, lymph, extremities, GI, GU, Musculoskeletal, constitutional, breasts, reproductive, HEENT.  All pertinent positives are noted in the HPI.  All others are negative.   PHYSICAL EXAMINATION:  Vitals:   10/28/19 1225  BP: 135/64  Pulse: 87  Resp: 18  Temp: 98.7 F (37.1 C)  TempSrc: Temporal  SpO2: 100%  Weight: 266 lb (120.7 kg)  Height: 5' 7" (1.702 m)  .  Exam was given in a wheelchair   GENERAL:alert, in no acute distress and comfortable SKIN: no acute rashes, no significant lesions EYES: conjunctiva are pink and non-injected, sclera anicteric OROPHARYNX: MMM, no exudates, no oropharyngeal erythema or ulceration NECK: supple, no JVD LYMPH:  no palpable lymphadenopathy in the cervical, axillary or inguinal regions LUNGS: clear to auscultation b/l with normal respiratory effort HEART: regular rate & rhythm ABDOMEN:  normoactive bowel sounds , non tender, not distended. No palpable hepatosplenomegaly.  Extremity: 1+ pedal edema b/l PSYCH: alert & oriented x 3 with fluent speech NEURO: no focal motor/sensory deficits   LABORATORY DATA:  I have reviewed the data as listed   .       CBC Latest Ref Rng & Units 10/28/2019 08/23/2019 08/21/2019  WBC 4.0 - 10.5 K/uL 117.3(HH) 99.7(HH) 108.4(HH)  Hemoglobin 12.0 - 15.0 g/dL 7.6(L) 8.0(L) 8.2(L)  Hematocrit 36.0 - 46.0 % 24.3(L) 24.9(L) 25.9(L)  Platelets 150 - 400 K/uL 365 193 177   . CBC    Component Value Date/Time   WBC 117.3 (HH) 10/28/2019 1144   RBC 2.55 (L) 10/28/2019 1144   HGB 7.6 (L) 10/28/2019 1144   HGB 11.4 (L) 03/06/2018 0910   HGB 11.3 (L) 07/24/2017 1402     HCT 24.3 (L) 10/28/2019 1144   HCT 27.7 (L) 07/12/2019 0129   HCT 36.0 07/24/2017 1402   PLT 365 10/28/2019 1144   PLT 166 03/06/2018 0910   PLT 172 07/24/2017 1402   MCV 95.3 10/28/2019 1144   MCV 93.3 07/24/2017 1402   MCH 29.8 10/28/2019 1144   MCHC 31.3 10/28/2019 1144   RDW 16.1 (H) 10/28/2019 1144   RDW 15.6 (H) 07/24/2017 1402   LYMPHSABS 3.3 10/28/2019 1144   LYMPHSABS 2.9 07/24/2017 1402   MONOABS 2.3 (H) 10/28/2019 1144   MONOABS 1.8 (H) 07/24/2017 1402   EOSABS 0.1 10/28/2019 1144   EOSABS 1.1 (H) 07/24/2017 1402   BASOSABS 0.1 10/28/2019 1144   BASOSABS 0.2 (H) 07/24/2017 1402   . CMP Latest Ref Rng & Units 08/26/2019 08/25/2019 08/24/2019  Glucose 70 - 99 mg/dL 71 51(L) <20(LL)  BUN 8 - 23 mg/dL 25(H) 24(H) 26(H)  Creatinine 0.44 - 1.00 mg/dL 1.15(H) 1.21(H) 1.23(H)  Sodium 135 - 145 mmol/L 135 138 137  Potassium 3.5 - 5.1 mmol/L 4.1 4.1 3.3(L)  Chloride 98 - 111 mmol/L 94(L) 97(L) 92(L)  CO2 22 - 32 mmol/L _0 Calcium 8.9 - 10.3 mg/dL 9.2 9.2 9.1  Total Protein 6.5 - 8.1 g/dL - - -  Total Bilirubin 0.3 - 1.2 mg/dL - - -  Alkaline Phos 38 - 126 U/L - - -  AST 15 - 41 U/L - - -  ALT 0 - 44 U/L - - -    RADIOGRAPHIC STUDIES: I have personally reviewed the radiological images as listed and agreed with the findings in the report. No results found.  ASSESSMENT & PLAN:   Erika [name] is a delightful, well-appearing 79 y.o. woman with   #1 Leukocytosis?predominantly Neutrophilia with some monocytosis CMML vs MPN NOS with chek 2 mutation Patient initially presented with a WBC count of 55.2k with 48.8k neutrophils.  WBC counts relatively stable today.  Peripheral blood smear personally previously reviewed-shows significant neutrophilia with left shift.  Increased bands with a few myelocytes and metamyelocytes.  Pelgeroid neutrophils.  No significant increased blasts.  Monocytosis present.  CT abdomen done for workup of her back pain did not show any  acute intra-abdominal or pelvic findings. He does not have any overt splenomegaly on her CT abdomen -which is somewhat argues against a chronic myeloproliferative disorder though does not rule this out.  MRI of the lumbar spine shows old surgical and degenerative changes but no evidence of tumor or infectious collections.  Leucocytosis - improved at 34.3k today. No fevers/chills or other constitutional symptoms  Foundation One heme with Chek 2 mutation. No other findings noted.  CT chest on 05/31/2017: IMPRESSION: 1. Mild ground-glass opacities suggest mild pulmonary edema. Small bilateral pleural effusions and basilar atelectasis. No pulmonary infiltrate. Mild subcarinal mediastinal adenopathy is likely reactive. Cardiomegaly.  JAK2 and BCR-ABL mutations neg. LDH levels were within normal limits and did not suggest a high-grade lymphoma.  06/24/17 BM bx- this showed hypercellular bone marrow with granulocytic proliferation. Cytogenetic analysis was normal.   #2 mild normocytic anemia. HGb at 11.4 today   PLAN:  -Discussed pt labwork today, 10/28/19; WBC continues to increase, significant anemia -Offered pt the option of a blood transfusion due to Hgb <8 - pt does not want at this time  -Advised pt that her condition is similar to CML, but she does not have a targetable mutation -Previously used Hydroxyurea to treat Leukocytosis but caused significant anemia. Will hold off on Hydroxyurea at this time.  -Advised pt that our only treatment option for her disease is a transplant, but she is not a candidate due to advanced age and comorbidities   -Will watch with labs and give blood transfusions prn  -Recommend pt continue to watch sodium intake due to heart disease  -Recommend pt receive the COVID19 vaccine when available  -Recommend pt stay up to date with her age appropriate cancer screenings with her PCP. -Recommend pt f/u to see her PCP as scheduled  -Rx low-dose Allopurinol -Will see  back in 4 weeks with labs and 1 unit of PRBC -Pt advised to contact if she becomes light headed, dizzy, or significantly fatigued   FOLLOW UP: RTC with Dr Irene Limbo with labs and appointment for PRBC transfusion in 1 month   The total time spent in the appt was 30 minutes and more than 50% was on counseling and direct patient cares.  All of the patient's questions were answered with apparent satisfaction. The patient knows to call the clinic with any problems, questions or concerns.   Sullivan Lone MD MS AAHIVMS [place]  Hematology/Oncology Physician [facility]  (Office):       [phone] (Work cell):  [phone] (Fax):           [phone]  I, Yevette Edwards, am acting as a scribe for Dr. Sullivan Lone.   .I have reviewed the above documentation for accuracy and completeness, and I agree with the above. Brunetta Genera MD

## 2019-10-28 ENCOUNTER — Other Ambulatory Visit: Payer: Self-pay

## 2019-10-28 ENCOUNTER — Inpatient Hospital Stay: Payer: PPO | Admitting: General Practice

## 2019-10-28 ENCOUNTER — Telehealth: Payer: Self-pay | Admitting: General Practice

## 2019-10-28 ENCOUNTER — Telehealth: Payer: Self-pay | Admitting: Hematology

## 2019-10-28 ENCOUNTER — Inpatient Hospital Stay: Payer: PPO | Attending: Hematology

## 2019-10-28 ENCOUNTER — Inpatient Hospital Stay (HOSPITAL_BASED_OUTPATIENT_CLINIC_OR_DEPARTMENT_OTHER): Payer: PPO | Admitting: Hematology

## 2019-10-28 VITALS — BP 135/64 | HR 87 | Temp 98.7°F | Resp 18 | Ht 67.0 in | Wt 266.0 lb

## 2019-10-28 DIAGNOSIS — Z79899 Other long term (current) drug therapy: Secondary | ICD-10-CM | POA: Diagnosis not present

## 2019-10-28 DIAGNOSIS — D649 Anemia, unspecified: Secondary | ICD-10-CM | POA: Diagnosis not present

## 2019-10-28 DIAGNOSIS — D471 Chronic myeloproliferative disease: Secondary | ICD-10-CM

## 2019-10-28 DIAGNOSIS — J40 Bronchitis, not specified as acute or chronic: Secondary | ICD-10-CM

## 2019-10-28 DIAGNOSIS — D72829 Elevated white blood cell count, unspecified: Secondary | ICD-10-CM | POA: Diagnosis not present

## 2019-10-28 DIAGNOSIS — M549 Dorsalgia, unspecified: Secondary | ICD-10-CM | POA: Insufficient documentation

## 2019-10-28 DIAGNOSIS — I5032 Chronic diastolic (congestive) heart failure: Secondary | ICD-10-CM | POA: Diagnosis not present

## 2019-10-28 DIAGNOSIS — I130 Hypertensive heart and chronic kidney disease with heart failure and stage 1 through stage 4 chronic kidney disease, or unspecified chronic kidney disease: Secondary | ICD-10-CM | POA: Insufficient documentation

## 2019-10-28 LAB — CBC WITH DIFFERENTIAL/PLATELET
Abs Immature Granulocytes: 11.33 10*3/uL — ABNORMAL HIGH (ref 0.00–0.07)
Basophils Absolute: 0.1 10*3/uL (ref 0.0–0.1)
Basophils Relative: 0 %
Eosinophils Absolute: 0.1 10*3/uL (ref 0.0–0.5)
Eosinophils Relative: 0 %
HCT: 24.3 % — ABNORMAL LOW (ref 36.0–46.0)
Hemoglobin: 7.6 g/dL — ABNORMAL LOW (ref 12.0–15.0)
Immature Granulocytes: 10 %
Lymphocytes Relative: 3 %
Lymphs Abs: 3.3 10*3/uL (ref 0.7–4.0)
MCH: 29.8 pg (ref 26.0–34.0)
MCHC: 31.3 g/dL (ref 30.0–36.0)
MCV: 95.3 fL (ref 80.0–100.0)
Monocytes Absolute: 2.3 10*3/uL — ABNORMAL HIGH (ref 0.1–1.0)
Monocytes Relative: 2 %
Neutro Abs: 100.1 10*3/uL — ABNORMAL HIGH (ref 1.7–7.7)
Neutrophils Relative %: 85 %
Platelets: 365 10*3/uL (ref 150–400)
RBC: 2.55 MIL/uL — ABNORMAL LOW (ref 3.87–5.11)
RDW: 16.1 % — ABNORMAL HIGH (ref 11.5–15.5)
WBC: 117.3 10*3/uL (ref 4.0–10.5)
nRBC: 0.1 % (ref 0.0–0.2)

## 2019-10-28 NOTE — Telephone Encounter (Signed)
Scheduled appt per 3/24 los - gave patient AVS and calender per los   

## 2019-10-28 NOTE — Telephone Encounter (Signed)
[facility] CSW Progress Notes  Call to patient to let her know that I have cancelled appt today to complete SCAT application w her - this has alredy been done by [place].  Pt asked about whether scales were at [facility] for her to pick up at todays appt - per note, these were to be given to Dr Pollie Meyer RN to give to patient. Message sent to Manya Silvas w patient concern.  Edwyna Shell, LCSW Clinical Social Worker Phone:  [phone]

## 2019-10-29 NOTE — Progress Notes (Signed)
Internal Medicine Clinic Resident   I have personally reviewed this encounter including the documentation in this note and/or discussed this patient with the care management provider. I will address any urgent items identified by the care management provider and will communicate my actions to the patient's PCP. I have reviewed the patient's CCM visit with my supervising attending.  Onnie Hatchel, MD 10/29/2019    

## 2019-10-30 ENCOUNTER — Ambulatory Visit (INDEPENDENT_AMBULATORY_CARE_PROVIDER_SITE_OTHER): Payer: PPO | Admitting: Internal Medicine

## 2019-10-30 ENCOUNTER — Encounter: Payer: Self-pay | Admitting: Internal Medicine

## 2019-10-30 ENCOUNTER — Ambulatory Visit: Payer: PPO

## 2019-10-30 ENCOUNTER — Telehealth: Payer: Self-pay | Admitting: *Deleted

## 2019-10-30 ENCOUNTER — Other Ambulatory Visit: Payer: Self-pay

## 2019-10-30 DIAGNOSIS — I480 Paroxysmal atrial fibrillation: Secondary | ICD-10-CM

## 2019-10-30 DIAGNOSIS — E876 Hypokalemia: Secondary | ICD-10-CM

## 2019-10-30 DIAGNOSIS — I10 Essential (primary) hypertension: Secondary | ICD-10-CM

## 2019-10-30 DIAGNOSIS — R682 Dry mouth, unspecified: Secondary | ICD-10-CM | POA: Diagnosis not present

## 2019-10-30 DIAGNOSIS — E790 Hyperuricemia without signs of inflammatory arthritis and tophaceous disease: Secondary | ICD-10-CM | POA: Diagnosis not present

## 2019-10-30 DIAGNOSIS — E883 Tumor lysis syndrome: Secondary | ICD-10-CM | POA: Diagnosis not present

## 2019-10-30 DIAGNOSIS — N179 Acute kidney failure, unspecified: Secondary | ICD-10-CM | POA: Diagnosis not present

## 2019-10-30 MED ORDER — ALLOPURINOL 300 MG PO TABS: 150.0000 mg | ORAL_TABLET | Freq: Two times a day (BID) | ORAL | 3 refills | Status: DC

## 2019-10-30 NOTE — Progress Notes (Signed)
[facility] Internal Medicine Residency Telephone Encounter Continuity Care Appointment  HPI:   This telephone encounter was created for [name] on 10/30/2019 for the following purpose/cc medication review.  Reports doing well.  She has a couple of medicines which are giving her dry mouth.  It makes her sick and she feels that her nausea is coming from that.  Quetiapine and Pravachol seem to be the worst actors.  She is not sure why she is on quetiapine and would like to stop it.  We discussed stopping these serially and seeing if that helps.  I asked her to stop quetiapine first and then pravachol a week later if her dry mouth is not better.   Taking K 10 meq per day.  No fast heart beat, chest pain.  Gets SOB when she tries to walk.  This has been always an issue for her.  We reviewed her other medications and she was taking them all as prescribed.  She noted that Dr. Irene Limbo would like her to stay on allopurinol (no note yet from his most recent visit), so I sent a refill in for that medication, 150mg  BID.   I would like her to get a BMET to check her potassium.  Will plan to have blood drawn through her home health service.     Past Medical History:  Past Medical History:  Diagnosis Date  . Anemia    with menses  . Asthma   . Atrial flutter, paroxysmal ([place])   . Back pain    status post surgery 2002  . Breast cyst    Excesion with FNA, begnin in 2004.   . Degenerative joint disease of spine    Imaging 2005,  Degenerative hypertrophic facet arthritis changes L4-5 and L5-S1..   . History of shingles    Recurrent with post herpetic neuralgia.   [name] Hypertension   . Lymphadenopathy    Of the mediastinum, Right side CXR 2008, not read on 2010 cxr.   . Menopause   . Obesity    BMI 54  . Psychosis ([place])    Secondary to prednisone.  . Shingles   . Stasis dermatitis    W/ LE edema, prviously on lasix now on mazxide.   . SVT (supraventricular tachycardia) [facility]) June 2009   one run while hospitalized  . Tuberculosis    active TB treated in 2002, hx of paraspinal lumbar TB,       ROS:   Dry mouth, nausea, chronic SOB.  Denies chest pain, dizziness, falls, fever, chills.     Assessment / Plan / Recommendations:   Please see A&P under problem oriented charting for assessment of the patient's acute and chronic medical conditions.   As always, pt is advised that if symptoms worsen or new symptoms arise, they should go to an urgent care facility or to to ER for further evaluation.   Consent and Medical Decision Making:   This is a telephone encounter between Erika [name] and Erika [name] on 10/30/2019 for follow up of medications. The visit was conducted with the patient located at home and Erika [name] at [facility]. The patient's identity was confirmed using their DOB and current address. The patient has consented to being evaluated through a telephone encounter and understands the associated risks (an examination cannot be done and the patient may need to come in for an appointment) / benefits (allows the patient to remain at home, decreasing exposure to coronavirus). I personally spent 15 minutes  on medical discussion.     Return to clinic in 4 months.

## 2019-10-30 NOTE — Assessment & Plan Note (Signed)
Have sent in a refill for allopurinol.  Erika [name] has 1 month follow up with Dr. Irene Limbo.

## 2019-10-30 NOTE — Telephone Encounter (Signed)
I called [facility], talked to [place]. Verbal order given for BMP per Dr Daryll Drown; stated pt only has PT but she will also order nurse visit x 1 to draw lab which will be done next week.

## 2019-10-30 NOTE — Assessment & Plan Note (Signed)
She is currently on K 31meq per day and furosemide.   Plan Recheck BMET for renal function and K Will be done at home through her home health agency, discussed with Holley Raring to help arrange this (nursing).

## 2019-10-30 NOTE — Patient Instructions (Signed)
Visit Information  Goals Addressed            This Visit's Progress   . COMPLETED: "I can't get too and from the car right now without help" (pt-stated)       CARE PLAN ENTRY (see longitudinal plan of care for additional care plan information)  Current Barriers:  . Transportation    Social Work Clinical Goal(s):  [name] Over the next 30 days, patient will work with SW to address concerns related to transportation   Interventions: .  Informed patient that she was approved for SCAT services and will receive a certification packet via mail.  Provided her with my contact information and encouraged her to call if additional need arise between now and next follow up or if she does not receive packet.  Patient Self Care Activities:  . Performs ADL's independently . Unable to perform IADLs independently  Please see past updates related to this goal by clicking on the "Past Updates" button in the selected goal         Patient verbalizes understanding of instructions provided today.   Face to Face appointment with care management team member scheduled for: 4.23.21      [place], [place] Coordination Social Worker [place] [phone]

## 2019-10-30 NOTE — Assessment & Plan Note (Signed)
Reports doing well. She is taking her metoprolol and apixiban without issue.  No reports of palpitations, chest pain, bleeding.   Plan Continue current therapy.  Consider EKG at next visit.

## 2019-10-30 NOTE — Patient Instructions (Signed)
Stop seroquel.   If dry mouth no better, stop pravachol.   Used teach back method to confirm her understanding.

## 2019-10-30 NOTE — Progress Notes (Signed)
Internal Medicine Clinic Resident  I have personally reviewed this encounter including the documentation in this note and/or discussed this patient with the care management provider. I will address any urgent items identified by the care management provider and will communicate my actions to the patient's PCP. I have reviewed the patient's CCM visit with my supervising attending.  Merel Santoli, MD 10/30/2019    

## 2019-10-30 NOTE — Assessment & Plan Note (Signed)
Will plan to re-check a BMET with her home health team.

## 2019-10-30 NOTE — Assessment & Plan Note (Signed)
Have sent in a refill for allopurinol and stressed to her the importance of taking this medication.

## 2019-10-30 NOTE — Chronic Care Management (AMB) (Signed)
  Care Management   Follow Up Note   10/30/2019 Name: Erika [name] MRN: [id number] DOB: 13-Apr-1941  Referred by: [name], MD Reason for referral : Care Coordination (Transportation )   Erika [name] is a 79 y.o. year old female who is a primary care patient of [name], MD. The care management team was consulted for assistance with care management and care coordination needs.    Review of patient status, including review of consultants reports, relevant laboratory and other test results, and collaboration with appropriate care team members and the patient's provider was performed as part of comprehensive patient evaluation and provision of chronic care management services.    SDOH (Social Determinants of Health) assessments performed: No See Care Plan activities for detailed interventions related to [facility])     Advanced Directives: See Care Plan and Vynca application for related entries.   Goals Addressed            This Visit's Progress   . COMPLETED: "I can't get too and from the car right now without help" (pt-stated)       CARE PLAN ENTRY (see longitudinal plan of care for additional care plan information)  Current Barriers:  . Transportation    Social Work Clinical Goal(s):  [name] Over the next 30 days, patient will work with SW to address concerns related to transportation   Interventions: .  Informed patient that she was approved for SCAT services and will receive a certification packet via mail.  Provided her with my contact information and encouraged her to call if additional need arise between now and next follow up or if she does not receive packet.  Patient Self Care Activities:  . Performs ADL's independently . Unable to perform IADLs independently  Please see past updates related to this goal by clicking on the "Past Updates" button in the selected goal          Face to Face appointment with care management team member scheduled for:  11/27/19   Ronn Melena, [place] Coordination Social Worker [facility] [phone]

## 2019-10-30 NOTE — Assessment & Plan Note (Signed)
She feels that this is medication related.  She notes that it is causing her to feel nauseous and dry heave.  She is drinking pedialyte and gatorade until it passes.  She would like to stop her Seroquel, which she is not even sure why she takes.  She notes no issues with sleeping.  It may have been started during a hospitalization and now that she is home she no longer needs it.  She also feels the pravachol is making this issue worse.  We discussed the following plan.  Stop the seroquel for 1 week.  If dry mouth does not improve or does not resolve, stop the pravachol.  She reports understanding.  If issue persists, she will need to come in to be seen.

## 2019-11-02 NOTE — Progress Notes (Signed)
Internal Medicine Clinic Attending  CCM services provided by the care management provider and their documentation were discussed with Dr. Harbrecht. We reviewed the pertinent findings, urgent action items addressed by the resident and non-urgent items to be addressed by the PCP.  I agree with the assessment, diagnosis, and plan of care documented in the CCM and resident's note.  Parrish Daddario, MD 11/02/2019 

## 2019-11-03 NOTE — Telephone Encounter (Signed)
Thank you :)

## 2019-11-04 ENCOUNTER — Telehealth: Payer: Self-pay | Admitting: *Deleted

## 2019-11-04 NOTE — Telephone Encounter (Signed)
agree

## 2019-11-04 NOTE — Telephone Encounter (Signed)
Pt calls to check before getting her vaccine for COVID, stating she is on "blood thinners", she is reassured that she can still get the vaccine and just to make the staff at vaccine site aware. Encouraged to get vaccine Do you agree?

## 2019-11-05 NOTE — Progress Notes (Signed)
Internal Medicine Clinic Attending  CCM services provided by the care management provider and their documentation were discussed with Dr. Harbrecht. We reviewed the pertinent findings, urgent action items addressed by the resident and non-urgent items to be addressed by the PCP.  I agree with the assessment, diagnosis, and plan of care documented in the CCM and resident's note.  Deondray Ospina, MD 11/05/2019 

## 2019-11-06 DIAGNOSIS — I350 Nonrheumatic aortic (valve) stenosis: Secondary | ICD-10-CM | POA: Diagnosis not present

## 2019-11-06 DIAGNOSIS — R531 Weakness: Secondary | ICD-10-CM | POA: Diagnosis not present

## 2019-11-06 DIAGNOSIS — I5031 Acute diastolic (congestive) heart failure: Secondary | ICD-10-CM | POA: Diagnosis not present

## 2019-11-06 DIAGNOSIS — I5033 Acute on chronic diastolic (congestive) heart failure: Secondary | ICD-10-CM | POA: Diagnosis not present

## 2019-11-06 DIAGNOSIS — R278 Other lack of coordination: Secondary | ICD-10-CM | POA: Diagnosis not present

## 2019-11-11 ENCOUNTER — Telehealth: Payer: Self-pay | Admitting: *Deleted

## 2019-11-11 NOTE — Telephone Encounter (Signed)
Okay!  We may need to reset our parameters.  I would like to see her in person first.  Had a [place] appointment last time.

## 2019-11-11 NOTE — Telephone Encounter (Signed)
Lyna Poser, RN with [facility] called to report patient's weight today is 252 lbs. Their parameters are to call if weight outside 280-290 lbs. Patient's last weight in Epic 266 on 10/28/2019. Patient states she's been laying off the junk food and moving around more. No adverse symptoms to report per Tracie. Hubbard Hartshorn, BSN, RN-BC

## 2019-11-18 NOTE — Telephone Encounter (Signed)
Good morning.  No Appointments are available to see you. Your next clinic is not until  12/25/2019.  Offered the patient an appointment with [facility] and she declined and would prefer to see you instead.  Please advise.

## 2019-11-19 NOTE — Telephone Encounter (Signed)
Please have her schedule with [facility].  If an afternoon appointment, I will come down and see her and make sure she is comfortable with the plan made by resident and [facility] attending.  They will just need to text me to let me know she is here.   EBM

## 2019-11-20 ENCOUNTER — Other Ambulatory Visit: Payer: Self-pay | Admitting: Internal Medicine

## 2019-11-20 NOTE — Telephone Encounter (Signed)
I would wait and have this addressed at her appointment.  Needs repeat blood work first.

## 2019-11-20 NOTE — Telephone Encounter (Signed)
Needs refill on potassium chloride (KLOR-CON) 10 MEQ tablet(Expired)  ;pt contact [phone]   [facility] [phone] - [place], [place]

## 2019-11-20 NOTE — Telephone Encounter (Signed)
Spoke with the patient this morning and she was okay with the plan to be seen by [facility]. Note added to let you know when she arrives on 11/24/2019 @ 2:45 pm.

## 2019-11-20 NOTE — Telephone Encounter (Signed)
5 day supply sent 10/01/2019 following CMP with [facility] at 3.3 on 09/29/2019. Please advise if patient is to continue this. Hubbard Hartshorn, BSN, RN-BC

## 2019-11-23 NOTE — Telephone Encounter (Signed)
Attempted to relay info below. No answer. Left message on VM requesting return call. Patient has appt tomorrow in [facility]. Hubbard Hartshorn, RN, BSN

## 2019-11-24 ENCOUNTER — Ambulatory Visit (INDEPENDENT_AMBULATORY_CARE_PROVIDER_SITE_OTHER): Payer: PPO | Admitting: Internal Medicine

## 2019-11-24 VITALS — BP 135/65 | HR 94 | Temp 97.5°F | Ht 67.0 in | Wt 248.9 lb

## 2019-11-24 DIAGNOSIS — R531 Weakness: Secondary | ICD-10-CM | POA: Diagnosis not present

## 2019-11-24 DIAGNOSIS — I5032 Chronic diastolic (congestive) heart failure: Secondary | ICD-10-CM

## 2019-11-24 DIAGNOSIS — R4182 Altered mental status, unspecified: Secondary | ICD-10-CM | POA: Diagnosis not present

## 2019-11-24 DIAGNOSIS — J4540 Moderate persistent asthma, uncomplicated: Secondary | ICD-10-CM

## 2019-11-24 DIAGNOSIS — I5033 Acute on chronic diastolic (congestive) heart failure: Secondary | ICD-10-CM | POA: Diagnosis not present

## 2019-11-24 NOTE — Progress Notes (Signed)
Acute Office Visit  Subjective:    Patient ID: Erika [name], female    DOB: Sep 09, 1940, 79 y.o.   MRN: [id number]  Cc: chronic HFpEF  HPI Patient is in today for follow-up on chronic HFpEF, specifically her weight parameters for monitoring at home. Please see problem based charting for further details.   Past Medical History:  Diagnosis Date  . Anemia    with menses  . Asthma   . Atrial flutter, paroxysmal ([place])   . Back pain    status post surgery 2002  . Breast cyst    Excesion with FNA, begnin in 2004.   . Degenerative joint disease of spine    Imaging 2005,  Degenerative hypertrophic facet arthritis changes L4-5 and L5-S1..   . History of shingles    Recurrent with post herpetic neuralgia.   [name] Hypertension   . Lymphadenopathy    Of the mediastinum, Right side CXR 2008, not read on 2010 cxr.   . Menopause   . Obesity    BMI 54  . Psychosis ([place])    Secondary to prednisone.  . Shingles   . Stasis dermatitis    W/ LE edema, prviously on lasix now on mazxide.   . SVT (supraventricular tachycardia) [facility]) June 2009   one run while hospitalized  . Tuberculosis    active TB treated in 2002, hx of paraspinal lumbar TB,     Past Surgical History:  Procedure Laterality Date  . BREAST CYST ASPIRATION Left   . Breast cyst biopsy    . CHOLECYSTECTOMY  02/02/2012   Procedure: LAPAROSCOPIC CHOLECYSTECTOMY;  Surgeon: Zenovia Jarred, MD;  Location: [facility];  Service: General;  Laterality: N/A;  . Hemilaminectomy of L4, L5 and S1 decompression of tumor in epidural space  2000  . IR FLUORO GUIDED NEEDLE PLC ASPIRATION/INJECTION LOC  07/17/2019    Family History  Problem Relation Age of Onset  . Stroke Mother        at young age  . Heart disease Mother   . Heart attack Father   . Heart disease Father   . Cancer Sister        Unknown  . Stroke Brother   . Stroke Brother   . Breast cancer Other   . Sickle cell anemia Brother   . Diabetes Brother   . Stroke Brother       Social History   Socioeconomic History  . Marital status: Widowed    Spouse name: Not on file  . Number of children: Not on file  . Years of education: Not on file  . Highest education level: Not on file  Occupational History  . 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 Smoking status: Never Smoker  . Smokeless tobacco: Never Used  Substance and Sexual Activity  . Alcohol use: No    Alcohol/week: 0.0 standard drinks  . Drug use: No  . Sexual activity: Not Currently    Birth control/protection: Post-menopausal  Other Topics Concern  . Not on file  Social History Narrative   Current Social History 03/20/2019        Patient lives alone in a ground floor apartment which is 1 story. There are not steps up to the entrance the patient uses. One son stays with her once in a while.      Patient's method of transportation is personal car.      The highest  level of education was some high school: 11th grade.      The patient currently retired.      Identified important Relationships are "My friend Mart Piggs, my nieces, my grandkids, a lot of people.       Pets : None       Interests / Fun: "Playing word games on my phone, watch TV, Talking on phone."       Current Stressors: "Nothing, really. I try not to let anything stress me."       Religious / Personal Beliefs: "I believe in God."       L. Ducatte, RN, BSN    Social Determinants of Health   Financial Resource Strain: Low Risk   . Difficulty of Paying Living Expenses: Not very hard  Food Insecurity: No Food Insecurity  . Worried About Charity fundraiser in the Last Year: Never true  . Ran Out of Food in the Last Year: Never true  Transportation Needs: Unmet Transportation Needs  . Lack of Transportation (Medical): Yes  . Lack of Transportation (Non-Medical): No  Physical Activity:   . Days of Exercise per Week:   . Minutes of Exercise per Session:    Stress:   . Feeling of Stress :   Social Connections:   . Frequency of Communication with Friends and Family:   . Frequency of Social Gatherings with Friends and Family:   . Attends Religious Services:   . Active Member of Clubs or Organizations:   . Attends Archivist Meetings:   [name] Marital Status:   Intimate Partner Violence:   . Fear of Current or Ex-Partner:   . Emotionally Abused:   [name] Physically Abused:   . Sexually Abused:     Outpatient Medications Prior to Visit  Medication Sig Dispense Refill  . ADVAIR DISKUS 500-50 MCG/DOSE AEPB Inhale 1 puff into the lungs 2 (two) times daily. 180 each 3  . albuterol (VENTOLIN HFA) 108 (90 Base) MCG/ACT inhaler Inhale 1-2 puffs into the lungs every 6 (six) hours as needed for wheezing or shortness of breath. 18 g 11  . allopurinol (ZYLOPRIM) 300 MG tablet Take 0.5 tablets (150 mg total) by mouth 2 (two) times daily. 90 tablet 3  . amLODipine (NORVASC) 10 MG tablet Take 0.5 tablets (5 mg total) by mouth daily. 90 tablet 3  . apixaban (ELIQUIS) 5 MG TABS tablet Take 1 tablet (5 mg total) by mouth 2 (two) times daily. 60 tablet 11  . aspirin EC 81 MG tablet Take 1 tablet (81 mg total) by mouth daily. 90 tablet 3  . Ensure Max Protein (ENSURE MAX PROTEIN) LIQD Take 330 mLs (11 oz total) by mouth 2 (two) times daily. (Patient not taking: Reported on 10/13/2019)    . fluticasone (FLONASE) 50 MCG/ACT nasal spray Place 1 spray into both nostrils daily. 16 g 3  . furosemide (LASIX) 40 MG tablet TAKE 1 TABLET(40 MG) BY MOUTH DAILY 90 tablet 1  . metoprolol tartrate (LOPRESSOR) 50 MG tablet Take 1 tablet (50 mg total) by mouth 2 (two) times daily. 60 tablet 11  . omeprazole (PRILOSEC) 20 MG capsule Take 1 capsule (20 mg total) by mouth daily. 90 capsule 3  . potassium chloride (KLOR-CON) 10 MEQ tablet Take 2 tablets (20 mEq total) by mouth daily for 5 days. 10 tablet 0  . pravastatin (PRAVACHOL) 20 MG tablet Take 1 tablet (20 mg total) by  mouth daily. 90 tablet 3  . sodium chloride (OCEAN)  0.65 % SOLN nasal spray Place 1 spray into both nostrils as needed for congestion. 88 mL 0   No facility-administered medications prior to visit.    Allergies  Allergen Reactions  . Prednisone Other (See Comments)    REACTION: Psychosis, talking out of head, insomnia    Review of Systems  Constitutional: Negative for activity change, chills and fever.  Respiratory: Negative for cough and shortness of breath.   Cardiovascular: Negative for chest pain and palpitations.  Gastrointestinal: Negative for abdominal distention.  Neurological: Negative for dizziness, weakness, light-headedness and headaches.       Objective:    Physical Exam Constitutional:      General: She is not in acute distress.    Appearance: Normal appearance.  Eyes:     Conjunctiva/sclera: Conjunctivae normal.  Neck:     Vascular: No JVD.  Cardiovascular:     Rate and Rhythm: Normal rate and regular rhythm.     Heart sounds: Murmur present.  Pulmonary:     Effort: Pulmonary effort is normal.     Breath sounds: Normal breath sounds.  Abdominal:     General: There is no distension.     Palpations: Abdomen is soft.     Tenderness: There is no abdominal tenderness.  Musculoskeletal:     Comments: Trace edema in BLE   Neurological:     General: No focal deficit present.     Mental Status: She is alert and oriented to person, place, and time.  Psychiatric:        Mood and Affect: Mood normal.        Behavior: Behavior normal.     BP 135/65 (BP Location: Right Arm, Patient Position: Sitting, Cuff Size: Normal)   Pulse 94   Temp (!) 97.5 F (36.4 C) (Oral)   Ht 5' 7"  (1.702 m)   Wt 248 lb 14.4 oz (112.9 kg)   SpO2 100%   BMI 38.98 kg/m  Wt Readings from Last 3 Encounters:  11/24/19 248 lb 14.4 oz (112.9 kg)  10/28/19 266 lb (120.7 kg)  08/27/19 268 lb 15.4 oz (122 kg)    Health Maintenance Due  Topic Date Due  . COVID-19 Vaccine (1) Never  done    There are no preventive care reminders to display for this patient.   Lab Results  Component Value Date   TSH 1.446 04/25/2011   Lab Results  Component Value Date   WBC 117.3 (HH) 10/28/2019   HGB 7.6 (L) 10/28/2019   HCT 24.3 (L) 10/28/2019   MCV 95.3 10/28/2019   PLT 365 10/28/2019   Lab Results  Component Value Date   NA 137 11/24/2019   K 4.4 11/24/2019   CHLORIDE 103 07/24/2017   CO2 22 11/24/2019   GLUCOSE 61 (L) 11/24/2019   BUN 17 11/24/2019   CREATININE 0.99 11/24/2019   BILITOT 0.5 08/02/2019   ALKPHOS 231 (H) 08/02/2019   AST 19 08/02/2019   ALT 13 08/02/2019   PROT 7.6 08/02/2019   ALBUMIN 3.3 (L) 08/02/2019   CALCIUM 9.5 11/24/2019   ANIONGAP 12 08/26/2019   EGFR 57 (L) 07/24/2017   Lab Results  Component Value Date   CHOL 164 06/20/2016   Lab Results  Component Value Date   HDL 86 06/20/2016   Lab Results  Component Value Date   LDLCALC 65 06/20/2016   Lab Results  Component Value Date   TRIG 63 06/20/2016   Lab Results  Component Value Date   CHOLHDL 1.9  06/20/2016   Lab Results  Component Value Date   HGBA1C 5.5 02/01/2012       Assessment & Plan:   Problem List Items Addressed This Visit      Cardiovascular and Mediastinum   Chronic heart failure with preserved ejection fraction (HFpEF) ([place])    Patient presents for follow-up on chronic HFpEF. She has lost 20 lbs since the beginning of the year with diet changes and increasing her activity. As a result, she needs adjustment in goal dry weight. She appears euvolemic on exam today and denies any symptoms of volume overload. Will make her new dry weight 248 lbs. She was instructed to call with 3 lb weight gain in 24 hours or 5 lb in 1 week.  Continue current medication regimen.         Respiratory   Moderate persistent asthma - Primary (Chronic)   Relevant Orders   BMP8+Anion Gap (Completed)       No orders of the defined types were placed in this  encounter.    Delice Bison, DO

## 2019-11-24 NOTE — Patient Instructions (Addendum)
Erika [name], It was great seeing you again! Glad to see you are doing so well. We will check some blood work today.   For your weight, 248 will be your new dry weight to go off of.  Please call if you gain 2-3 (250-251) lbs in a day or 5 lbs in 1 week (253).   We will not make any changes to your medications today. Your blood pressure looks great!   Take care, Dr. Koleen Distance

## 2019-11-25 ENCOUNTER — Encounter: Payer: Self-pay | Admitting: Internal Medicine

## 2019-11-25 DIAGNOSIS — I509 Heart failure, unspecified: Secondary | ICD-10-CM | POA: Diagnosis not present

## 2019-11-25 LAB — BMP8+ANION GAP
Anion Gap: 17 mmol/L (ref 10.0–18.0)
BUN/Creatinine Ratio: 17 (ref 12–28)
BUN: 17 mg/dL (ref 8–27)
CO2: 22 mmol/L (ref 20–29)
Calcium: 9.5 mg/dL (ref 8.7–10.3)
Chloride: 98 mmol/L (ref 96–106)
Creatinine, Ser: 0.99 mg/dL (ref 0.57–1.00)
GFR calc Af Amer: 64 mL/min/{1.73_m2} (ref >59)
GFR calc non Af Amer: 56 mL/min/{1.73_m2} — ABNORMAL LOW (ref >59)
Glucose: 61 mg/dL — ABNORMAL LOW (ref 65–99)
Potassium: 4.4 mmol/L (ref 3.5–5.2)
Sodium: 137 mmol/L (ref 134–144)

## 2019-11-25 NOTE — Assessment & Plan Note (Signed)
Patient presents for follow-up on chronic HFpEF. She has lost 20 lbs since the beginning of the year with diet changes and increasing her activity. As a result, she needs adjustment in goal dry weight. She appears euvolemic on exam today and denies any symptoms of volume overload. Will make her new dry weight 248 lbs. She was instructed to call with 3 lb weight gain in 24 hours or 5 lb in 1 week.  Continue current medication regimen.

## 2019-11-27 ENCOUNTER — Telehealth: Payer: PPO

## 2019-11-27 ENCOUNTER — Ambulatory Visit: Payer: Self-pay

## 2019-11-27 NOTE — Chronic Care Management (AMB) (Signed)
  Chronic Care Management   Outreach Note  11/27/2019 Name: Erika [name] MRN: [id number] DOB: 11-30-1940  Referred by: [name], MD Reason for referral : Care Coordination (Transportation )   An unsuccessful telephone outreach was attempted today. The patient was referred to the case management Social Worker for assistance with transportation.  Patient has been approved for SCAT services.  Attempted to reach her today to ensure she has received certification packet.   Follow Up Plan: In basket message to [facility], Kelli Churn, to inquire with patient about receipt of certification packet during her next scheduled outreach.      Ronn Melena, [place] Coordination Social Worker [place] [phone]

## 2019-11-30 ENCOUNTER — Other Ambulatory Visit: Payer: Self-pay

## 2019-11-30 ENCOUNTER — Inpatient Hospital Stay: Payer: PPO | Attending: Hematology | Admitting: Hematology

## 2019-11-30 ENCOUNTER — Inpatient Hospital Stay: Payer: PPO

## 2019-11-30 ENCOUNTER — Telehealth: Payer: Self-pay | Admitting: Hematology

## 2019-11-30 VITALS — BP 124/62 | HR 84 | Temp 99.1°F | Resp 18 | Ht 67.0 in | Wt 251.7 lb

## 2019-11-30 DIAGNOSIS — Z809 Family history of malignant neoplasm, unspecified: Secondary | ICD-10-CM | POA: Diagnosis not present

## 2019-11-30 DIAGNOSIS — D471 Chronic myeloproliferative disease: Secondary | ICD-10-CM | POA: Diagnosis not present

## 2019-11-30 DIAGNOSIS — Z833 Family history of diabetes mellitus: Secondary | ICD-10-CM | POA: Diagnosis not present

## 2019-11-30 DIAGNOSIS — Z8249 Family history of ischemic heart disease and other diseases of the circulatory system: Secondary | ICD-10-CM | POA: Insufficient documentation

## 2019-11-30 DIAGNOSIS — Z7982 Long term (current) use of aspirin: Secondary | ICD-10-CM | POA: Insufficient documentation

## 2019-11-30 DIAGNOSIS — Z803 Family history of malignant neoplasm of breast: Secondary | ICD-10-CM | POA: Insufficient documentation

## 2019-11-30 DIAGNOSIS — Z79899 Other long term (current) drug therapy: Secondary | ICD-10-CM | POA: Insufficient documentation

## 2019-11-30 DIAGNOSIS — D72829 Elevated white blood cell count, unspecified: Secondary | ICD-10-CM | POA: Diagnosis not present

## 2019-11-30 DIAGNOSIS — Z823 Family history of stroke: Secondary | ICD-10-CM | POA: Diagnosis not present

## 2019-11-30 DIAGNOSIS — I10 Essential (primary) hypertension: Secondary | ICD-10-CM | POA: Insufficient documentation

## 2019-11-30 DIAGNOSIS — D649 Anemia, unspecified: Secondary | ICD-10-CM | POA: Diagnosis not present

## 2019-11-30 LAB — CBC WITH DIFFERENTIAL/PLATELET
Abs Immature Granulocytes: 6.56 10*3/uL — ABNORMAL HIGH (ref 0.00–0.07)
Basophils Absolute: 0.1 10*3/uL (ref 0.0–0.1)
Basophils Relative: 0 %
Eosinophils Absolute: 0.5 10*3/uL (ref 0.0–0.5)
Eosinophils Relative: 1 %
HCT: 27.3 % — ABNORMAL LOW (ref 36.0–46.0)
Hemoglobin: 8.4 g/dL — ABNORMAL LOW (ref 12.0–15.0)
Immature Granulocytes: 8 %
Lymphocytes Relative: 4 %
Lymphs Abs: 3.1 10*3/uL (ref 0.7–4.0)
MCH: 29.5 pg (ref 26.0–34.0)
MCHC: 30.8 g/dL (ref 30.0–36.0)
MCV: 95.8 fL (ref 80.0–100.0)
Monocytes Absolute: 2.3 10*3/uL — ABNORMAL HIGH (ref 0.1–1.0)
Monocytes Relative: 3 %
Neutro Abs: 71.9 10*3/uL — ABNORMAL HIGH (ref 1.7–7.7)
Neutrophils Relative %: 84 %
Platelets: 151 10*3/uL (ref 150–400)
RBC: 2.85 MIL/uL — ABNORMAL LOW (ref 3.87–5.11)
RDW: 17.6 % — ABNORMAL HIGH (ref 11.5–15.5)
WBC: 84.4 10*3/uL (ref 4.0–10.5)
nRBC: 0 % (ref 0.0–0.2)

## 2019-11-30 LAB — IRON AND TIBC
Iron: 36 ug/dL — ABNORMAL LOW (ref 41–142)
Saturation Ratios: 10 % — ABNORMAL LOW (ref 21–57)
TIBC: 350 ug/dL (ref 236–444)
UIBC: 314 ug/dL (ref 120–384)

## 2019-11-30 LAB — URIC ACID: Uric Acid, Serum: 5.6 mg/dL (ref 2.5–7.1)

## 2019-11-30 LAB — CMP (CANCER CENTER ONLY)
ALT: 7 U/L (ref 0–44)
AST: 13 U/L — ABNORMAL LOW (ref 15–41)
Albumin: 3.4 g/dL — ABNORMAL LOW (ref 3.5–5.0)
Alkaline Phosphatase: 327 U/L — ABNORMAL HIGH (ref 38–126)
Anion gap: 9 (ref 5–15)
BUN: 18 mg/dL (ref 8–23)
CO2: 26 mmol/L (ref 22–32)
Calcium: 9 mg/dL (ref 8.9–10.3)
Chloride: 105 mmol/L (ref 98–111)
Creatinine: 1.03 mg/dL — ABNORMAL HIGH (ref 0.44–1.00)
GFR, Est AFR Am: >60 mL/min (ref >60)
GFR, Estimated: 53 mL/min — ABNORMAL LOW (ref >60)
Glucose, Bld: 92 mg/dL (ref 70–99)
Potassium: 3.4 mmol/L — ABNORMAL LOW (ref 3.5–5.1)
Sodium: 140 mmol/L (ref 135–145)
Total Bilirubin: 0.3 mg/dL (ref 0.3–1.2)
Total Protein: 7.7 g/dL (ref 6.5–8.1)

## 2019-11-30 LAB — FERRITIN: Ferritin: 278 ng/mL (ref 11–307)

## 2019-11-30 LAB — SAMPLE TO BLOOD BANK

## 2019-11-30 NOTE — Telephone Encounter (Signed)
Scheduled per los. Gave avs and calendar  

## 2019-11-30 NOTE — Progress Notes (Signed)
HEMATOLOGY/ONCOLOGY CLINIC NOTE  Date of Service: 11/30/19    Inpatient Attending: .Brunetta Genera, MD  CC:  F/u MPN NOS chek2 mutation +ve  HPI  Patient is a very pleasant 79 year old female with a history of morbid obesity, anemia, asthma, hypertension, lumbar spinal TB in 2002 who presented with acute onset of lower back pain on Monday. She reports that she drank some pepsi and tea on that day and her back pain began shortly after. No initial injury to precipitate her back pain. It gradually worsened until she was seen in the ED on 05/30/17. Labs during admission revealed WBC count of 55.2k with predominantly neutrophilia of 48.8k and some monocytosis at 2.2k.   She had minimal anemia at 11.5 with an MCV of 92 and a normal platelet count of 163k.  At that time she had CT A/P while in the ED which was normal and without splenomegaly. Additionally, she also had MRI of the L-spine which only showed degenerative changes. She was subsequently admitted for further workup and pain control and to rule out a source of infection.   Of note, the patient has a remote prior h/o epidural mass in 2002 (hemilaminectomy and decompression of tumor in epidural space 2002) which was thought to be secondary to lumbar epidural tuberculosis. She was placed on a course of antibiotics for several months following her diagnosis.   Following her admission, we were consulted for further workup. No recent new medications. She reports that her inhaler does contain steroids, but otherwise she has not been on a course of steroids recently. She is UTD with her regular cancer screenings. She does not feel any different now than she did 19moto a year ago.  No focal symptoms suggestive of infection other than the back pain at this time.  On ROS, she denies fever, chills, night sweats, unexpected weight loss, abdominal pain, sore throat, joint swelling, or any other associated symptoms. Her back pain is much improved  with muscle relaxants. She does note some right great toe pain, but otherwise she has no other acute symptoms.   INTERVAL HISTORY:   Erika CALDEIRAis here for follow up of persistent leucocytosis/neutrophilia.consistent with chronic MPN NOS with Chek2 mutation. The patient's last visit with [facility] on 10/28/2019. The pt reports that she is doing well overall.  The pt reports that she has felt well and has no new concerns. Her back and hip pain have been stable and well-controlled. Pt has had one dose of the COVID19 vaccine and will be receiving her second dose today.   Lab results today (11/30/19) of CBC w/diff and CMP is as follows: all values are WNL except for WBC at 84.4K, RBC at 2.85, Hgb at 8.4, HCT at 27.3, RDW at 17.6, Neutro Abs at 71.9K, Mono Abs at 2.3K, WBC Morphology shows "Toxic Granulation", Abs Immature Granulocytes at 6.56K, Potassium at 3.4, Creatinine at 1.03, Albumin at 3.4, AST at 13, ALP at 327. 11/30/2019 Ferritin at 278 11/30/2019 Uric Acid at 5.6 11/30/2019 Iron and TIBC is as follows: Iron at 36, TIBC at 350, Sat Ratios at 10, UIBC at 314  On review of systems, pt denies fatigue, lightheadedness, dizziness, leg swelling, new bone pain, low appetite, unexpected weight loss, fevers, chills, abdominal pain, constipation, diarrhea and any other symptoms.   MEDICAL HISTORY:  Past Medical History:  Diagnosis Date  . Anemia    with menses  . Asthma   . Atrial flutter, paroxysmal ([place]   .  Back pain    status post surgery 2002  . Breast cyst    Excesion with FNA, begnin in 2004.   . Degenerative joint disease of spine    Imaging 2005,  Degenerative hypertrophic facet arthritis changes L4-5 and L5-S1..   . History of shingles    Recurrent with post herpetic neuralgia.   [name] Hypertension   . Lymphadenopathy    Of the mediastinum, Right side CXR 2008, not read on 2010 cxr.   . Menopause   . Obesity    BMI 54  . Psychosis ([place])    Secondary to prednisone.  .  Shingles   . Stasis dermatitis    W/ LE edema, prviously on lasix now on mazxide.   . SVT (supraventricular tachycardia) [facility]) June 2009   one run while hospitalized  . Tuberculosis    active TB treated in 2002, hx of paraspinal lumbar TB,     SURGICAL HISTORY: Past Surgical History:  Procedure Laterality Date  . BREAST CYST ASPIRATION Left   . Breast cyst biopsy    . CHOLECYSTECTOMY  02/02/2012   Procedure: LAPAROSCOPIC CHOLECYSTECTOMY;  Surgeon: Zenovia Jarred, MD;  Location: [facility];  Service: General;  Laterality: N/A;  . Hemilaminectomy of L4, L5 and S1 decompression of tumor in epidural space  2000  . IR FLUORO GUIDED NEEDLE PLC ASPIRATION/INJECTION LOC  07/17/2019    SOCIAL HISTORY: Social History   Socioeconomic History  . Marital status: Widowed    Spouse name: Not on file  . Number of children: Not on file  . Years of education: Not on file  . Highest education level: Not on file  Occupational History  . 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 Smoking status: Never Smoker  . Smokeless tobacco: Never Used  Substance and Sexual Activity  . Alcohol use: No    Alcohol/week: 0.0 standard drinks  . Drug use: No  . Sexual activity: Not Currently    Birth control/protection: Post-menopausal  Other Topics Concern  . Not on file  Social History Narrative   Current Social History 03/20/2019        Patient lives alone in a ground floor apartment which is 1 story. There are not steps up to the entrance the patient uses. One son stays with her once in a while.      Patient's method of transportation is personal car.      The highest level of education was some high school: 11th grade.      The patient currently retired.      Identified important Relationships are "My friend Mart Piggs, my nieces, my grandkids, a lot of people.       Pets : None       Interests / Fun: "Playing word games on my phone,  watch TV, Talking on phone."       Current Stressors: "Nothing, really. I try not to let anything stress me."       Religious / Personal Beliefs: "I believe in God."       L. Ducatte, RN, BSN    Social Determinants of Health   Financial Resource Strain: Low Risk   . Difficulty of Paying Living Expenses: Not very hard  Food Insecurity: No Food Insecurity  . Worried About Charity fundraiser in the Last Year: Never true  . Ran Out of Food in the Last Year: Never true  Transportation Needs:  Unmet Transportation Needs  . Lack of Transportation (Medical): Yes  . Lack of Transportation (Non-Medical): No  Physical Activity:   . Days of Exercise per Week:   . Minutes of Exercise per Session:   Stress:   . Feeling of Stress :   Social Connections:   . Frequency of Communication with Friends and Family:   . Frequency of Social Gatherings with Friends and Family:   . Attends Religious Services:   . Active Member of Clubs or Organizations:   . Attends Archivist Meetings:   [name] Marital Status:   Intimate Partner Violence:   . Fear of Current or Ex-Partner:   . Emotionally Abused:   [name] Physically Abused:   . Sexually Abused:     FAMILY HISTORY: Family History  Problem Relation Age of Onset  . Stroke Mother        at young age  . Heart disease Mother   . Heart attack Father   . Heart disease Father   . Cancer Sister        Unknown  . Stroke Brother   . Stroke Brother   . Breast cancer Other   . Sickle cell anemia Brother   . Diabetes Brother   . Stroke Brother     ALLERGIES:  is allergic to prednisone.  MEDICATIONS:  . Current Outpatient Medications:  .  ADVAIR DISKUS 500-50 MCG/DOSE AEPB, Inhale 1 puff into the lungs 2 (two) times daily., Disp: 180 each, Rfl: 3 .  albuterol (VENTOLIN HFA) 108 (90 Base) MCG/ACT inhaler, Inhale 1-2 puffs into the lungs every 6 (six) hours as needed for wheezing or shortness of breath., Disp: 18 g, Rfl: 11 .  allopurinol  (ZYLOPRIM) 300 MG tablet, Take 0.5 tablets (150 mg total) by mouth 2 (two) times daily., Disp: 90 tablet, Rfl: 3 .  amLODipine (NORVASC) 10 MG tablet, Take 0.5 tablets (5 mg total) by mouth daily., Disp: 90 tablet, Rfl: 3 .  apixaban (ELIQUIS) 5 MG TABS tablet, Take 1 tablet (5 mg total) by mouth 2 (two) times daily., Disp: 60 tablet, Rfl: 11 .  aspirin EC 81 MG tablet, Take 1 tablet (81 mg total) by mouth daily., Disp: 90 tablet, Rfl: 3 .  fluticasone (FLONASE) 50 MCG/ACT nasal spray, Place 1 spray into both nostrils daily., Disp: 16 g, Rfl: 3 .  furosemide (LASIX) 40 MG tablet, TAKE 1 TABLET(40 MG) BY MOUTH DAILY, Disp: 90 tablet, Rfl: 1 .  omeprazole (PRILOSEC) 20 MG capsule, Take 1 capsule (20 mg total) by mouth daily., Disp: 90 capsule, Rfl: 3 .  pravastatin (PRAVACHOL) 20 MG tablet, Take 1 tablet (20 mg total) by mouth daily., Disp: 90 tablet, Rfl: 3 .  sodium chloride (OCEAN) 0.65 % SOLN nasal spray, Place 1 spray into both nostrils as needed for congestion., Disp: 88 mL, Rfl: 0 .  Ensure Max Protein (ENSURE MAX PROTEIN) LIQD, Take 330 mLs (11 oz total) by mouth 2 (two) times daily. (Patient not taking: Reported on 10/13/2019), Disp:  , Rfl:  .  metoprolol tartrate (LOPRESSOR) 50 MG tablet, Take 1 tablet (50 mg total) by mouth 2 (two) times daily., Disp: 60 tablet, Rfl: 11 .  potassium chloride (KLOR-CON) 10 MEQ tablet, Take 2 tablets (20 mEq total) by mouth daily for 5 days., Disp: 10 tablet, Rfl: 0   REVIEW OF SYSTEMS:   A 10+ POINT REVIEW OF SYSTEMS WAS OBTAINED including neurology, dermatology, psychiatry, cardiac, respiratory, lymph, extremities, GI, GU, Musculoskeletal, constitutional, breasts, reproductive, HEENT.  All pertinent positives are noted in the HPI.  All others are negative.   PHYSICAL EXAMINATION:  Vitals:   11/30/19 1052  BP: 124/62  Pulse: 84  Resp: 18  Temp: 99.1 F (37.3 C)  TempSrc: Temporal  SpO2: 100%  Weight: 251 lb 11.2 oz (114.2 kg)  Height: 5' 7"  (1.702  m)  .  Exam was given in a wheelchair   GENERAL:alert, in no acute distress and comfortable SKIN: no acute rashes, no significant lesions EYES: conjunctiva are pink and non-injected, sclera anicteric OROPHARYNX: MMM, no exudates, no oropharyngeal erythema or ulceration NECK: supple, no JVD LYMPH:  no palpable lymphadenopathy in the cervical, axillary or inguinal regions LUNGS: clear to auscultation b/l with normal respiratory effort HEART: regular rate & rhythm ABDOMEN:  normoactive bowel sounds , non tender, not distended. No palpable hepatosplenomegaly.  Extremity: no pedal edema PSYCH: alert & oriented x 3 with fluent speech NEURO: no focal motor/sensory deficits  LABORATORY DATA:  I have reviewed the data as listed   .       CBC Latest Ref Rng & Units 11/30/2019 10/28/2019 08/23/2019  WBC 4.0 - 10.5 K/uL 84.4(HH) 117.3(HH) 99.7(HH)  Hemoglobin 12.0 - 15.0 g/dL 8.4(L) 7.6(L) 8.0(L)  Hematocrit 36.0 - 46.0 % 27.3(L) 24.3(L) 24.9(L)  Platelets 150 - 400 K/uL 151 365 193   . CBC    Component Value Date/Time   WBC 84.4 (HH) 11/30/2019 1002   RBC 2.85 (L) 11/30/2019 1002   HGB 8.4 (L) 11/30/2019 1002   HGB 11.4 (L) 03/06/2018 0910   HGB 11.3 (L) 07/24/2017 1402   HCT 27.3 (L) 11/30/2019 1002   HCT 27.7 (L) 07/12/2019 0129   HCT 36.0 07/24/2017 1402   PLT 151 11/30/2019 1002   PLT 166 03/06/2018 0910   PLT 172 07/24/2017 1402   MCV 95.8 11/30/2019 1002   MCV 93.3 07/24/2017 1402   MCH 29.5 11/30/2019 1002   MCHC 30.8 11/30/2019 1002   RDW 17.6 (H) 11/30/2019 1002   RDW 15.6 (H) 07/24/2017 1402   LYMPHSABS 3.1 11/30/2019 1002   LYMPHSABS 2.9 07/24/2017 1402   MONOABS 2.3 (H) 11/30/2019 1002   MONOABS 1.8 (H) 07/24/2017 1402   EOSABS 0.5 11/30/2019 1002   EOSABS 1.1 (H) 07/24/2017 1402   BASOSABS 0.1 11/30/2019 1002   BASOSABS 0.2 (H) 07/24/2017 1402   . CMP Latest Ref Rng & Units 11/30/2019 11/24/2019 08/26/2019  Glucose 70 - 99 mg/dL 92 61(L) 71  BUN 8 - 23  mg/dL 18 17 25(H)  Creatinine 0.44 - 1.00 mg/dL 1.03(H) 0.99 1.15(H)  Sodium 135 - 145 mmol/L 140 137 135  Potassium 3.5 - 5.1 mmol/L 3.4(L) 4.4 4.1  Chloride 98 - 111 mmol/L 105 98 94(L)  CO2 22 - 32 mmol/L 26 22 29   Calcium 8.9 - 10.3 mg/dL 9.0 9.5 9.2  Total Protein 6.5 - 8.1 g/dL 7.7 - -  Total Bilirubin 0.3 - 1.2 mg/dL 0.3 - -  Alkaline Phos 38 - 126 U/L 327(H) - -  AST 15 - 41 U/L 13(L) - -  ALT 0 - 44 U/L 7 - -    RADIOGRAPHIC STUDIES: I have personally reviewed the radiological images as listed and agreed with the findings in the report. No results found.  ASSESSMENT & PLAN:   Erika [name] is a delightful, well-appearing 79 y.o. woman with   #1 Leukocytosis?predominantly Neutrophilia with some monocytosis CMML vs MPN NOS with chek 2 mutation Patient initially presented with a WBC count of  55.2k with 48.8k neutrophils.  WBC counts relatively stable today.  Peripheral blood smear personally previously reviewed-shows significant neutrophilia with left shift.  Increased bands with a few myelocytes and metamyelocytes.  Pelgeroid neutrophils.  No significant increased blasts.  Monocytosis present.  CT abdomen done for workup of her back pain did not show any acute intra-abdominal or pelvic findings. He does not have any overt splenomegaly on her CT abdomen -which is somewhat argues against a chronic myeloproliferative disorder though does not rule this out.  MRI of the lumbar spine shows old surgical and degenerative changes but no evidence of tumor or infectious collections.  Leucocytosis - improved at 34.3k today. No fevers/chills or other constitutional symptoms  Foundation One heme with Chek 2 mutation. No other findings noted.  CT chest on 05/31/2017: IMPRESSION: 1. Mild ground-glass opacities suggest mild pulmonary edema. Small bilateral pleural effusions and basilar atelectasis. No pulmonary infiltrate. Mild subcarinal mediastinal adenopathy is likely reactive.  Cardiomegaly.  JAK2 and BCR-ABL mutations neg. LDH levels were within normal limits and did not suggest a high-grade lymphoma.  06/24/17 BM bx- this showed hypercellular bone marrow with granulocytic proliferation. Cytogenetic analysis was normal.   #2 mild normocytic anemia. HGb at 11.4 today   PLAN:  -Discussed pt labwork today, 11/30/19; Hgb has improved, WBC has come down, Potassium is low, other blood chemistries are stable  -Discussed 11/30/2019 Ferritin is well-replaced  -Discussed 11/30/2019 Uric Acid is WNL -Discussed 11/30/2019 Iron and TIBC is as follows: Iron at 36, TIBC at 350, Sat Ratios at 10, UIBC at 314 -Will watch with labs and give blood transfusions prn - no indication for a transfusion today  -Recommend pt restart Potassium supplement or increase dietary Potassium  -Recommend pt f/u as scheduled for the second does of the COVID19 vaccine  -Will continue to hold off on Hydroxyurea at this time.  -Will see back in 3 months with labs    FOLLOW UP: RTC with Dr Irene Limbo with labs in 3 months Schedule for 1 unit of PRBC with f/u visit in 3 months if needed   The total time spent in the appt was 20 minutes and more than 50% was on counseling and direct patient cares.  All of the patient's questions were answered with apparent satisfaction. The patient knows to call the clinic with any problems, questions or concerns.   Sullivan Lone MD [place] AAHIVMS [facility] Hematology/Oncology Physician [facility]  (Office):       [phone] (Work cell):  [phone] (Fax):           [phone]  I, Yevette Edwards, am acting as a scribe for Dr. Sullivan Lone.   .I have reviewed the above documentation for accuracy and completeness, and I agree with the above. Brunetta Genera MD

## 2019-12-01 ENCOUNTER — Ambulatory Visit: Payer: PPO | Admitting: *Deleted

## 2019-12-01 DIAGNOSIS — I5032 Chronic diastolic (congestive) heart failure: Secondary | ICD-10-CM

## 2019-12-01 DIAGNOSIS — I480 Paroxysmal atrial fibrillation: Secondary | ICD-10-CM

## 2019-12-01 DIAGNOSIS — I10 Essential (primary) hypertension: Secondary | ICD-10-CM

## 2019-12-01 NOTE — Progress Notes (Signed)
Internal Medicine Clinic Resident   I have personally reviewed this encounter including the documentation in this note and/or discussed this patient with the care management provider. I will address any urgent items identified by the care management provider and will communicate my actions to the patient's PCP. I have reviewed the patient's CCM visit with my supervising attending.  Brandon Michelene [place], MD 12/01/2019    

## 2019-12-01 NOTE — Chronic Care Management (AMB) (Signed)
Chronic Care Management   Follow Up Note   12/01/2019 Name: Erika [name] MRN: [id number] DOB: Feb 07, 1941  Referred by: [name], MD Reason for referral : Chronic Care Management (Asthma, HTN, HF)   Erika [name] is a 79 y.o. year old female who is a primary care patient of [name], MD. The CCM team was consulted for assistance with chronic disease management and care coordination needs.    Review of patient status, including review of consultants reports, relevant laboratory and other test results, and collaboration with appropriate care team members and the patient's provider was performed as part of comprehensive patient evaluation and provision of chronic care management services.    SDOH (Social Determinants of Health) assessments performed: No See Care Plan activities for detailed interventions related to [facility])     Outpatient Encounter Medications as of 12/01/2019  Medication Sig Note  . ADVAIR DISKUS 500-50 MCG/DOSE AEPB Inhale 1 puff into the lungs 2 (two) times daily.   [name] albuterol (VENTOLIN HFA) 108 (90 Base) MCG/ACT inhaler Inhale 1-2 puffs into the lungs every 6 (six) hours as needed for wheezing or shortness of breath. 12/01/2019: States she uses once daily not prn- clarified purpose and use of rescue inhaler  . allopurinol (ZYLOPRIM) 300 MG tablet Take 0.5 tablets (150 mg total) by mouth 2 (two) times daily.   [name] amLODipine (NORVASC) 10 MG tablet Take 0.5 tablets (5 mg total) by mouth daily.   [name] apixaban (ELIQUIS) 5 MG TABS tablet Take 1 tablet (5 mg total) by mouth 2 (two) times daily.   [name] aspirin EC 81 MG tablet Take 1 tablet (81 mg total) by mouth daily.   . Ensure Max Protein (ENSURE MAX PROTEIN) LIQD Take 330 mLs (11 oz total) by mouth 2 (two) times daily. (Patient taking differently: Take 11 oz by mouth 2 (two) times daily. She drinks regular Ensure not Ensure Max and she prefers chocolate) 12/01/2019: Will contact [employer] rep for coupons and/or free  samples  . fluticasone (FLONASE) 50 MCG/ACT nasal spray Place 1 spray into both nostrils daily.   . furosemide (LASIX) 40 MG tablet TAKE 1 TABLET(40 MG) BY MOUTH DAILY   . omeprazole (PRILOSEC) 20 MG capsule Take 1 capsule (20 mg total) by mouth daily.   . pravastatin (PRAVACHOL) 20 MG tablet Take 1 tablet (20 mg total) by mouth daily.   . metoprolol tartrate (LOPRESSOR) 50 MG tablet Take 1 tablet (50 mg total) by mouth 2 (two) times daily. 12/01/2019: Says she is taking 1/2 of a 25 mg tablet once daily- will clarify dosage with provider  . NYAMYC powder    . potassium chloride (KLOR-CON) 10 MEQ tablet Take 2 tablets (20 mEq total) by mouth daily for 5 days. 12/01/2019: Patient states she is not taking K+   . sodium chloride (OCEAN) 0.65 % SOLN nasal spray Place 1 spray into both nostrils as needed for congestion.    No facility-administered encounter medications on file as of 12/01/2019.   Objective:  Wt Readings from Last 3 Encounters:  11/30/19 251 lb 11.2 oz (114.2 kg)  11/24/19 248 lb 14.4 oz (112.9 kg)  10/28/19 266 lb (120.7 kg)    Goals Addressed            This Visit's Progress     Patient Stated   . COMPLETED: " I hope to be able to get around better and feel better in a few weeks" (pt-stated)       CARE  PLAN ENTRY (see longitudinal plan of care for additional care plan information)   Current Barriers:  . Chronic Disease Management support, education, and care coordination needs related to transportation needs in a patient with CHF and HTN - patient states she feels better and stronger and is back to driving but appreciates the work done by [employer] in getting her certified for [employer] transportation should she need it in the future.  Case Manager Clinical Goal(s):  [name] Over the next 30 days, patient will work with BSW to address needs related to  transportation issues  in patient with CHF and HTN  Interventions:  . Advised patient she has been certified for [place]  and should have received a SCAT certification packet in the mail  Patient Self Care Activities:  . Self administers medications as prescribed . Attends all scheduled provider appointments . Calls pharmacy for medication refills   Please see past updates related to this goal by clicking on the "Past Updates" button in the selected goal      . "i feel so much better now that I've completed home health physical therapy and can drive again. " (pt-stated)       [place] (see longitudinal plan of care for additional care plan information)   Current Barriers:  [name] Knowledge deficit related to basic heart failure pathophysiology and self care management- patient states she feels much better after she received home health p[physical therapy and is now back to driving and can walk to her car without dyspnea. She says she received a good report form her oncologist yesterday and was glad she did not have to have a blood transfusion because her Hgb had increased. She voices good understanding of her treatment plan for HF and, HTN a fib and asthma, Says she is writing her daily weights in the calendar this CCM RN mailed to her. States she received her second Covid vaccination yesterday.   Case Manager Clinical Goal(s):  [name] Over the next 30 days, patient will weigh self daily and record- met . Over the next 30 days, patient will verbalize understanding of Heart Failure Action Plan and when to call doctor- met . Over the next 30 days, patient will take all Heart Failure mediations as prescribed- not met patient taking metoprolol only daily not twice daily and is taking 1/2 of table, not a whole tablet . Over the next 30 days, patient will weigh daily and record (notifying MD of 3 lb weight gain over night or 5 lb in a week)- met  Interventions:  . Reviewed Heart Failure, asthma, a fib and HTN  treatment plans in depth  . Reviewed dry weight established as 248 lbs on 11/24/19 and reviewed weight gain  parameters indicating need for  provider notification  . Will clarify dose of metoprolol with provider . Reviewed role of diuretics in prevention of fluid overload and management of heart failure . Provided patient with phone number to arrange Covid vaccination for her homebound friend Patient Self Care Activities:  . Takes Heart Failure Medications as prescribed . Weighs daily and record (notifying MD of 3 lb weight gain over night or 5 lb in a week) . Verbalizes understanding of and follows CHF Action Plan . Adheres to low sodium diet, heart healthy diet  Please see past updates related to this goal by clicking on the "Past Updates" button in the selected goal          Plan:   The care management  team will reach out to the patient again over the next 30 days.    Kelli Churn RN, CCM, [facility] RN Care Manager [phone]

## 2019-12-01 NOTE — Patient Instructions (Signed)
Visit Information It was so nice speaking with you today and I am so glad you are feeling so much better. Keep up the good work with weighing yourself each day and recording the weight.  I will call you about the Ensure once I have heard back from the [employer] reps.  Goals Addressed            This Visit's Progress     Patient Stated   . COMPLETED: " I hope to be able to get around better and feel better in a few weeks" (pt-stated)       CARE PLAN ENTRY (see longitudinal plan of care for additional care plan information)   Current Barriers:  . Chronic Disease Management support, education, and care coordination needs related to transportation needs in a patient with CHF and HTN - patient states she feels better and stronger and is back to driving but appreciates the work done by [employer] in getting her certified for [employer] transportation should she need it in the future.  Case Manager Clinical Goal(s):  [name] Over the next 30 days, patient will work with BSW to address needs related to  transportation issues  in patient with CHF and HTN  Interventions:  . Advised patient she has been certified for [employer] and should have received a [employer] certification packet in the mail  Patient Self Care Activities:  . Self administers medications as prescribed . Attends all scheduled provider appointments . Calls pharmacy for medication refills   Please see past updates related to this goal by clicking on the "Past Updates" button in the selected goal      . "i feel so much better now that I've completed home health physical therapy and can drive again. " (pt-stated)       [place] (see longitudinal plan of care for additional care plan information)   Current Barriers:  [name] Knowledge deficit related to basic heart failure pathophysiology and self care management- patient states she feels much better after she received home health p[physical therapy and is now back to driving and can walk  to her car without dyspnea. She says she received a good report form her oncologist yesterday and was glad she did not have to have a blood transfusion because her Hgb had increased. She voices good understanding of her treatment plan for HF and, HTN a fib and asthma, Says she is writing her daily weights in the calendar this CCM RN mailed to her. States she received her second Covid vaccination yesterday.   Case Manager Clinical Goal(s):  [name] Over the next 30 days, patient will weigh self daily and record- met . Over the next 30 days, patient will verbalize understanding of Heart Failure Action Plan and when to call doctor- met . Over the next 30 days, patient will take all Heart Failure mediations as prescribed- not met patient taking metoprolol only daily not twice daily and is taking 1/2 of table, not a whole tablet . Over the next 30 days, patient will weigh daily and record (notifying MD of 3 lb weight gain over night or 5 lb in a week)- met  Interventions:  . Reviewed Heart Failure, asthma, a fib and HTN  treatment plans in depth  . Reviewed dry weight established as 248 lbs on 11/24/19 and reviewed weight gain parameters indicating need for  provider notification  . Will clarify dose of metoprolol with provider . Reviewed role of diuretics in prevention of fluid overload and  management of heart failure . Provided patient with phone number to arrange Covid vaccination for her homebound friend Patient Self Care Activities:  . Takes Heart Failure Medications as prescribed . Weighs daily and record (notifying MD of 3 lb weight gain over night or 5 lb in a week) . Verbalizes understanding of and follows CHF Action Plan . Adheres to low sodium diet, heart healthy diet  Please see past updates related to this goal by clicking on the "Past Updates" button in the selected goal         The patient verbalized understanding of instructions provided today and declined a print copy of patient  instruction materials.   The care management team will reach out to the patient again over the next 30 days.   Kelli Churn RN, CCM, [facility] RN Care Manager [phone]

## 2019-12-02 NOTE — Progress Notes (Signed)
Internal Medicine Clinic Attending  CCM services provided by the care management provider and their documentation were discussed with Dr. Winfrey. We reviewed the pertinent findings, urgent action items addressed by the resident and non-urgent items to be addressed by the PCP.  I agree with the assessment, diagnosis, and plan of care documented in the CCM and resident's note.  Anes Rigel, MD 12/02/2019  

## 2019-12-02 NOTE — Progress Notes (Signed)
Internal Medicine Clinic Attending  I saw and evaluated the patient.  I personally confirmed the key portions of the history and exam documented by Dr. Bloomfield and I reviewed pertinent patient test results.  The assessment, diagnosis, and plan were formulated together and I agree with the documentation in the resident's note.  

## 2019-12-22 ENCOUNTER — Telehealth: Payer: PPO

## 2019-12-22 ENCOUNTER — Ambulatory Visit: Payer: Self-pay | Admitting: *Deleted

## 2019-12-22 DIAGNOSIS — I10 Essential (primary) hypertension: Secondary | ICD-10-CM

## 2019-12-22 DIAGNOSIS — J4540 Moderate persistent asthma, uncomplicated: Secondary | ICD-10-CM

## 2019-12-22 DIAGNOSIS — I5032 Chronic diastolic (congestive) heart failure: Secondary | ICD-10-CM

## 2019-12-22 NOTE — Chronic Care Management (AMB) (Signed)
  Chronic Care Management   Outreach Note  12/22/2019 Name: Erika [name] MRN: [id number] DOB: 11/08/1940  Referred by: [name], MD Reason for referral : Chronic Care Management (HF, HTN, A fib, asthma)   An unsuccessful telephone outreach was attempted today. The patient was referred to the case management team for assistance with care management and care coordination.   Follow Up Plan: A HIPPA compliant phone message was left for the patient providing contact information and requesting a return call.  The care management team will reach out to the patient again over the next 7 days.   Kelli Churn RN, CCM, [facility] RN Care Manager [phone]

## 2019-12-24 DIAGNOSIS — R531 Weakness: Secondary | ICD-10-CM | POA: Diagnosis not present

## 2019-12-24 DIAGNOSIS — R4182 Altered mental status, unspecified: Secondary | ICD-10-CM | POA: Diagnosis not present

## 2019-12-24 DIAGNOSIS — I5033 Acute on chronic diastolic (congestive) heart failure: Secondary | ICD-10-CM | POA: Diagnosis not present

## 2019-12-25 DIAGNOSIS — I509 Heart failure, unspecified: Secondary | ICD-10-CM | POA: Diagnosis not present

## 2019-12-29 ENCOUNTER — Telehealth: Payer: PPO

## 2019-12-30 ENCOUNTER — Ambulatory Visit: Payer: PPO | Admitting: *Deleted

## 2019-12-30 DIAGNOSIS — I10 Essential (primary) hypertension: Secondary | ICD-10-CM

## 2019-12-30 DIAGNOSIS — J4540 Moderate persistent asthma, uncomplicated: Secondary | ICD-10-CM

## 2019-12-30 DIAGNOSIS — I5032 Chronic diastolic (congestive) heart failure: Secondary | ICD-10-CM

## 2019-12-30 NOTE — Progress Notes (Signed)
Internal Medicine Clinic Resident  I have personally reviewed this encounter including the documentation in this note and/or discussed this patient with the care management provider. I will address any urgent items identified by the care management provider and will communicate my actions to the patient's PCP. I have reviewed the patient's CCM visit with my supervising attending, Dr Vincent.  Vahini Chundi, MD 12/30/2019    

## 2019-12-30 NOTE — Patient Instructions (Signed)
Visit Information It was so nice speaking with you today and I am so glad you continue to feel so well.  You are doing a great job of managing your health issues.   Goals Addressed            This Visit's Progress     Patient Stated   . "i feel so much better now that I've completed home health physical therapy and can drive again. " (pt-stated)       CARE PLAN ENTRY (see longitudinal plan of care for additional care plan information)   Current Barriers:  [name] Knowledge deficit related to basic heart failure pathophysiology and self care management- patient states she continues to do well, is very pleased that she is able to drive again and can walk to her car without dyspnea. She says her nausea, anorexia and extremely dry mouth resolved completely after she stopped the Seroquel. She continues to voice good understanding of her treatment plan for HF and, HTN a fib and asthma, she says she continues to weigh herself daily and record her weights in the [place] [facility] calendar this CCM RN mailed to her. She says she will see her oncologist in July and has no other routine appointments scheduled until then.  Case Manager Clinical Goal(s):  [name] Over the next 30 days, patient will weigh self daily and record- met . Over the next 30 days, patient will verbalize understanding of Heart Failure Action Plan and when to call doctor- met . Over the next 30 days, patient will take all Heart Failure mediations as prescribed- not met patient taking metoprolol only daily not twice daily and is taking 1/2 of table, not a whole tablet . Over the next 30 days, patient will weigh daily and record (notifying MD of 3 lb weight gain over night or 5 lb in a week)- met  Interventions:  . Reviewed Heart Failure, asthma, a fib and HTN  treatment plans  . Reviewed dry weight established as 248 lbs on 11/24/19 and reviewed weight gain parameters indicating need for  provider notification   . Reviewed role of diuretics in prevention of fluid overload and management of heart failure . Provided positive reinforcement for patient's excellent disease self management skills    Patient Self Care Activities:  . Takes Heart Failure Medications as prescribed . Weighs daily and record (notifying MD of 3 lb weight gain over night or 5 lb in a week) . Verbalizes understanding of and follows CHF Action Plan . Adheres to low sodium diet, heart healthy diet  Please see past updates related to this goal by clicking on the "Past Updates" button in the selected goal         The patient verbalized understanding of instructions provided today and declined a print copy of patient instruction materials.   The care management team will reach out to the patient again over the next 30 days.   Kelli Churn RN, CCM, [facility] RN Care Manager [phone]

## 2019-12-30 NOTE — Chronic Care Management (AMB) (Signed)
Chronic Care Management   Follow Up Note   12/30/2019 Name: Erika [name] MRN: [id number] DOB: 1941-06-10  Referred by: [name], MD Reason for referral : Chronic Care Management (HF, HTN, A fib, asthma)   Erika [name] is a 79 y.o. year old female who is a primary care patient of [name], MD. The CCM team was consulted for assistance with chronic disease management and care coordination needs.    Review of patient status, including review of consultants reports, relevant laboratory and other test results, and collaboration with appropriate care team members and the patient's provider was performed as part of comprehensive patient evaluation and provision of chronic care management services.    SDOH (Social Determinants of Health) assessments performed: No See Care Plan activities for detailed interventions related to [facility])     Outpatient Encounter Medications as of 12/30/2019  Medication Sig Note  . ADVAIR DISKUS 500-50 MCG/DOSE AEPB Inhale 1 puff into the lungs 2 (two) times daily.   [name] albuterol (VENTOLIN HFA) 108 (90 Base) MCG/ACT inhaler Inhale 1-2 puffs into the lungs every 6 (six) hours as needed for wheezing or shortness of breath. 12/01/2019: States she uses once daily not prn- clarified purpose and use of rescue inhaler  . allopurinol (ZYLOPRIM) 300 MG tablet Take 0.5 tablets (150 mg total) by mouth 2 (two) times daily.   [name] amLODipine (NORVASC) 10 MG tablet Take 0.5 tablets (5 mg total) by mouth daily.   [name] apixaban (ELIQUIS) 5 MG TABS tablet Take 1 tablet (5 mg total) by mouth 2 (two) times daily.   [name] aspirin EC 81 MG tablet Take 1 tablet (81 mg total) by mouth daily.   . Ensure Max Protein (ENSURE MAX PROTEIN) LIQD Take 330 mLs (11 oz total) by mouth 2 (two) times daily. (Patient taking differently: Take 11 oz by mouth 2 (two) times daily. She drinks regular Ensure not Ensure Max and she prefers chocolate) 12/01/2019: Will contact [employer] rep for coupons and/or free  samples  . fluticasone (FLONASE) 50 MCG/ACT nasal spray Place 1 spray into both nostrils daily.   . furosemide (LASIX) 40 MG tablet TAKE 1 TABLET(40 MG) BY MOUTH DAILY   . metoprolol tartrate (LOPRESSOR) 50 MG tablet Take 1 tablet (50 mg total) by mouth 2 (two) times daily. 12/01/2019: Says she is taking 1/2 of a 25 mg tablet once daily- will clarify dosage with provider  . NYAMYC powder    . omeprazole (PRILOSEC) 20 MG capsule Take 1 capsule (20 mg total) by mouth daily.   . potassium chloride (KLOR-CON) 10 MEQ tablet Take 2 tablets (20 mEq total) by mouth daily for 5 days. 12/01/2019: Patient states she is not taking K+   . pravastatin (PRAVACHOL) 20 MG tablet Take 1 tablet (20 mg total) by mouth daily.   . sodium chloride (OCEAN) 0.65 % SOLN nasal spray Place 1 spray into both nostrils as needed for congestion.    No facility-administered encounter medications on file as of 12/30/2019.     Objective:  Wt Readings from Last 3 Encounters:  11/30/19 251 lb 11.2 oz (114.2 kg)  11/24/19 248 lb 14.4 oz (112.9 kg)  10/28/19 266 lb (120.7 kg)   BP Readings from Last 3 Encounters:  11/30/19 124/62  11/24/19 135/65  10/28/19 135/64    Goals Addressed            This Visit's Progress     Patient Stated   . "i feel so much better  now that I've completed home health physical therapy and can drive again. " (pt-stated)       CARE PLAN ENTRY (see longitudinal plan of care for additional care plan information)   Current Barriers:  [name] Knowledge deficit related to basic heart failure pathophysiology and self care management- patient states she continues to do well, is very pleased that she is able to drive again and can walk to her car without dyspnea. She says her nausea, anorexia and extremely dry mouth resolved completely after she stopped the Seroquel. She continues to voice good understanding of her treatment plan for HF and, HTN a fib and asthma, she says she continues to weigh herself daily  and record her weights in the [place] [facility] calendar this CCM RN mailed to her. She says she will see her oncologist in July and has no other routine appointments scheduled until then.  Case Manager Clinical Goal(s):  [name] Over the next 30 days, patient will weigh self daily and record- met . Over the next 30 days, patient will verbalize understanding of Heart Failure Action Plan and when to call doctor- met . Over the next 30 days, patient will take all Heart Failure mediations as prescribed- not met patient taking metoprolol only daily not twice daily and is taking 1/2 of table, not a whole tablet . Over the next 30 days, patient will weigh daily and record (notifying MD of 3 lb weight gain over night or 5 lb in a week)- met  Interventions:  . Reviewed Heart Failure, asthma, a fib and HTN  treatment plans  . Reviewed dry weight established as 248 lbs on 11/24/19 and reviewed weight gain parameters indicating need for  provider notification  . Reviewed role of diuretics in prevention of fluid overload and management of heart failure . ProvidedProvided positive reinforcement for patient's excellent disease self management skills    Patient Self Care Activities:  . Takes Heart Failure Medications as prescribed . Weighs daily and record (notifying MD of 3 lb weight gain over night or 5 lb in a week) . Verbalizes understanding of and follows CHF Action Plan . Adheres to low sodium diet, heart healthy diet  Please see past updates related to this goal by clicking on the "Past Updates" button in the selected goal          Plan:   The care management team will reach out to the patient again over the next 30 days.    Kelli Churn RN, CCM, [facility] RN Care Manager [phone]

## 2019-12-30 NOTE — Progress Notes (Signed)
Internal Medicine Clinic Attending  CCM services provided by the care management provider and their documentation were discussed with Dr. Chundi. We reviewed the pertinent findings, urgent action items addressed by the resident and non-urgent items to be addressed by the PCP.  I agree with the assessment, diagnosis, and plan of care documented in the CCM and resident's note.  Jameison Haji Thomas Heinz Eckert, MD 12/30/2019  

## 2020-01-01 ENCOUNTER — Other Ambulatory Visit: Payer: Self-pay | Admitting: Internal Medicine

## 2020-01-24 DIAGNOSIS — R4182 Altered mental status, unspecified: Secondary | ICD-10-CM | POA: Diagnosis not present

## 2020-01-24 DIAGNOSIS — I5033 Acute on chronic diastolic (congestive) heart failure: Secondary | ICD-10-CM | POA: Diagnosis not present

## 2020-01-24 DIAGNOSIS — R531 Weakness: Secondary | ICD-10-CM | POA: Diagnosis not present

## 2020-01-25 DIAGNOSIS — I509 Heart failure, unspecified: Secondary | ICD-10-CM | POA: Diagnosis not present

## 2020-01-26 ENCOUNTER — Ambulatory Visit: Payer: PPO | Admitting: *Deleted

## 2020-01-26 DIAGNOSIS — I10 Essential (primary) hypertension: Secondary | ICD-10-CM

## 2020-01-26 DIAGNOSIS — I5032 Chronic diastolic (congestive) heart failure: Secondary | ICD-10-CM

## 2020-01-26 DIAGNOSIS — J4540 Moderate persistent asthma, uncomplicated: Secondary | ICD-10-CM

## 2020-01-26 NOTE — Chronic Care Management (AMB) (Signed)
Chronic Care Management   Follow Up Note   01/26/2020 Name: Erika [name] MRN: [id number] DOB: [birth year]  Referred by: [name], MD Reason for referral : Chronic Care Management (HTN, HF, Asthma)   Erika [name] is a 79 y.o. year old female who is a primary care patient of [name], MD. The CCM team was consulted for assistance with chronic disease management and care coordination needs.    Review of patient status, including review of consultants reports, relevant laboratory and other test results, and collaboration with appropriate care team members and the patient's provider was performed as part of comprehensive patient evaluation and provision of chronic care management services.    SDOH (Social Determinants of Health) assessments performed: No See Care Plan activities for detailed interventions related to [facility])     Outpatient Encounter Medications as of 01/26/2020  Medication Sig Note  . NYAMYC powder APPLY TOPICALLY TWICE DAILY   . ADVAIR DISKUS 500-50 MCG/DOSE AEPB Inhale 1 puff into the lungs 2 (two) times daily.   [name] albuterol (VENTOLIN HFA) 108 (90 Base) MCG/ACT inhaler Inhale 1-2 puffs into the lungs every 6 (six) hours as needed for wheezing or shortness of breath. 12/01/2019: States she uses once daily not prn- clarified purpose and use of rescue inhaler  . allopurinol (ZYLOPRIM) 300 MG tablet Take 0.5 tablets (150 mg total) by mouth 2 (two) times daily.   [name] amLODipine (NORVASC) 10 MG tablet Take 0.5 tablets (5 mg total) by mouth daily.   [name] apixaban (ELIQUIS) 5 MG TABS tablet Take 1 tablet (5 mg total) by mouth 2 (two) times daily.   [name] aspirin EC 81 MG tablet Take 1 tablet (81 mg total) by mouth daily.   . Ensure Max Protein (ENSURE MAX PROTEIN) LIQD Take 330 mLs (11 oz total) by mouth 2 (two) times daily. (Patient taking differently: Take 11 oz by mouth 2 (two) times daily. She drinks regular Ensure not Ensure Max and she prefers chocolate) 12/01/2019: Will  contact [employer] rep for coupons and/or free samples  . fluticasone (FLONASE) 50 MCG/ACT nasal spray Place 1 spray into both nostrils daily.   . furosemide (LASIX) 40 MG tablet TAKE 1 TABLET(40 MG) BY MOUTH DAILY   . metoprolol tartrate (LOPRESSOR) 50 MG tablet Take 1 tablet (50 mg total) by mouth 2 (two) times daily. 12/01/2019: Says she is taking 1/2 of a 25 mg tablet once daily- will clarify dosage with provider  . omeprazole (PRILOSEC) 20 MG capsule Take 1 capsule (20 mg total) by mouth daily.   . potassium chloride (KLOR-CON) 10 MEQ tablet Take 2 tablets (20 mEq total) by mouth daily for 5 days. 12/01/2019: Patient states she is not taking K+   . pravastatin (PRAVACHOL) 20 MG tablet Take 1 tablet (20 mg total) by mouth daily.   . sodium chloride (OCEAN) 0.65 % SOLN nasal spray Place 1 spray into both nostrils as needed for congestion.    No facility-administered encounter medications on file as of 01/26/2020.     Objective:   BP Readings from Last 3 Encounters:  11/30/19 124/62  11/24/19 135/65  10/28/19 135/64   Wt Readings from Last 3 Encounters:  11/30/19 251 lb 11.2 oz (114.2 kg)  11/24/19 248 lb 14.4 oz (112.9 kg)  10/28/19 266 lb (120.7 kg)    Goals Addressed              This Visit's Progress     Patient Stated   .  "  I feel so much better now that I've completed home health physical therapy and can drive again. " (pt-stated)        CARE PLAN ENTRY (see longitudinal plan of care for additional care plan information)   Current Barriers:  [name] Knowledge deficit related to basic heart failure pathophysiology and self care management- patient states she continues to do well, is very pleased that she is able to drive again and can walk to her car without dyspnea. She continues to voice good understanding of her treatment plan for HF and, HTN a fib and asthma, she says she continues to weigh herself daily and record her weights in the [place] [facility]  calendar this CCM RN mailed to her. She says she will see her oncologist in July and has no other routine appointments scheduled until then. She also says she is due to see her primary care provider in July.   Case Manager Clinical Goal(s):  [name] Over the next 30 - 60 days, patient will weigh self daily and record- met . Over the next 30 - 60 days, patient will verbalize understanding of Heart Failure Action Plan and when to call doctor- met . Over the next 30 - 60 days, patient will take all Heart Failure mediations as prescribed- not met patient taking metoprolol only daily not twice daily and is taking 1/2 of table, not a whole tablet . Over the next 30 - 60 days, patient will weigh daily and record (notifying MD of 3 lb weight gain over night or 5 lb in a week)- met  Interventions:  . Reviewed Heart Failure, asthma, a fib and HTN  treatment plans  . Reviewed dry weight established as 248 lbs on 11/24/19 and reviewed weight gain parameters indicating need for  provider notification  . Reviewed role of diuretics in prevention of fluid overload and management of heart failure . Provided positive reinforcement for patient's excellent disease self management skills  Patient Self Care Activities:  . Takes Heart Failure Medications as prescribed . Weighs daily and record (notifying MD of 3 lb weight gain over night or 5 lb in a week) . Verbalizes understanding of and follows CHF Action Plan . Adheres to low sodium diet, heart healthy diet  Please see past updates related to this goal by clicking on the "Past Updates" button in the selected goal          Plan:   The care management team will reach out to the patient again over the next 30-60 days.    Kelli Churn RN, CCM, [facility] RN Care Manager [phone]

## 2020-01-26 NOTE — Patient Instructions (Signed)
Visit Information It was nice speaking with you today and I am glad you continue to feel well.   Goals Addressed              This Visit's Progress     Patient Stated   .  "I feel so much better now that I've completed home health physical therapy and can drive again. " (pt-stated)        CARE PLAN ENTRY (see longitudinal plan of care for additional care plan information)   Current Barriers:  [name] Knowledge deficit related to basic heart failure pathophysiology and self care management- patient states she continues to do well, is very pleased that she is able to drive again and can walk to her car without dyspnea. She continues to voice good understanding of her treatment plan for HF and, HTN a fib and asthma, she says she continues to weigh herself daily and record her weights in the [place] [facility] calendar this CCM RN mailed to her. She says she will see her oncologist in July and has no other routine appointments scheduled until then. She also says she is due to see her primary care provider in July.   Case Manager Clinical Goal(s):  [name] Over the next 30 - 60 days, patient will weigh self daily and record- met . Over the next 30 - 60 days, patient will verbalize understanding of Heart Failure Action Plan and when to call doctor- met . Over the next 30 - 60 days, patient will take all Heart Failure mediations as prescribed- not met patient taking metoprolol only daily not twice daily and is taking 1/2 of table, not a whole tablet . Over the next 30 - 60 days, patient will weigh daily and record (notifying MD of 3 lb weight gain over night or 5 lb in a week)- met  Interventions:  . Reviewed Heart Failure, asthma, a fib and HTN  treatment plans  . Reviewed dry weight established as 248 lbs on 11/24/19 and reviewed weight gain parameters indicating need for  provider notification  . Reviewed role of diuretics in prevention of fluid overload and management of heart  failure . Provided positive reinforcement for patient's excellent disease self management skills  Patient Self Care Activities:  . Takes Heart Failure Medications as prescribed . Weighs daily and record (notifying MD of 3 lb weight gain over night or 5 lb in a week) . Verbalizes understanding of and follows CHF Action Plan . Adheres to low sodium diet, heart healthy diet  Please see past updates related to this goal by clicking on the "Past Updates" button in the selected goal         The patient verbalized understanding of instructions provided today and declined a print copy of patient instruction materials.   The care management team will reach out to the patient again over the next 30-60 days.   Kelli Churn RN, CCM, [facility] RN Care Manager [phone]

## 2020-01-26 NOTE — Progress Notes (Signed)
Internal Medicine Clinic Attending  CCM services provided by the care management provider and their documentation were discussed with Dr. Basaraba. We reviewed the pertinent findings, urgent action items addressed by the resident and non-urgent items to be addressed by the PCP.  I agree with the assessment, diagnosis, and plan of care documented in the CCM and resident's note.  Bernadetta Roell, MD 01/26/2020 

## 2020-01-26 NOTE — Progress Notes (Signed)
Internal Medicine Clinic Resident  I have personally reviewed this encounter including the documentation in this note and/or discussed this patient with the care management provider. I will address any urgent items identified by the care management provider and will communicate my actions to the patient's PCP. I have reviewed the patient's CCM visit with my supervising attending, Dr Guilloud.  Ellis Koffler, MD 01/26/2020    

## 2020-02-23 DIAGNOSIS — R4182 Altered mental status, unspecified: Secondary | ICD-10-CM | POA: Diagnosis not present

## 2020-02-23 DIAGNOSIS — I5033 Acute on chronic diastolic (congestive) heart failure: Secondary | ICD-10-CM | POA: Diagnosis not present

## 2020-02-23 DIAGNOSIS — R531 Weakness: Secondary | ICD-10-CM | POA: Diagnosis not present

## 2020-02-24 DIAGNOSIS — I509 Heart failure, unspecified: Secondary | ICD-10-CM | POA: Diagnosis not present

## 2020-02-29 NOTE — Progress Notes (Signed)
HEMATOLOGY/ONCOLOGY CLINIC NOTE  Date of Service: 03/01/20    Inpatient Attending: .Brunetta Genera, MD  CC:  F/u MPN NOS chek2 mutation +ve  HPI  Patient is a very pleasant 79 year old female with a history of morbid obesity, anemia, asthma, hypertension, lumbar spinal TB in 2002 who presented with acute onset of lower back pain on Monday. She reports that she drank some pepsi and tea on that day and her back pain began shortly after. No initial injury to precipitate her back pain. It gradually worsened until she was seen in the ED on 05/30/17. Labs during admission revealed WBC count of 55.2k with predominantly neutrophilia of 48.8k and some monocytosis at 2.2k.   She had minimal anemia at 11.5 with an MCV of 92 and a normal platelet count of 163k.  At that time she had CT A/P while in the ED which was normal and without splenomegaly. Additionally, she also had MRI of the L-spine which only showed degenerative changes. She was subsequently admitted for further workup and pain control and to rule out a source of infection.   Of note, the patient has a remote prior h/o epidural mass in 2002 (hemilaminectomy and decompression of tumor in epidural space 2002) which was thought to be secondary to lumbar epidural tuberculosis. She was placed on a course of antibiotics for several months following her diagnosis.   Following her admission, we were consulted for further workup. No recent new medications. She reports that her inhaler does contain steroids, but otherwise she has not been on a course of steroids recently. She is UTD with her regular cancer screenings. She does not feel any different now than she did 53moto a year ago.  No focal symptoms suggestive of infection other than the back pain at this time.  On ROS, she denies fever, chills, night sweats, unexpected weight loss, abdominal pain, sore throat, joint swelling, or any other associated symptoms. Her back pain is much improved  with muscle relaxants. She does note some right great toe pain, but otherwise she has no other acute symptoms.   INTERVAL HISTORY:  Erika JOUBERTis here for follow up of persistent leucocytosis/neutrophilia consistent with chronic MPN NOS with Chek2 mutation. The patient's last visit with [facility] on 11/30/2019. The pt reports that she is doing well overall.  The pt reports that she has been feeling well and denies any new symptoms. She does not currently take a daily multivitamin, but drinks an Ensure supplement every morning. Pt admits that she is not drinking the recommended amount of water daily.   Lab results today (03/01/20) of CBC w/diff and CMP is as follows: all values are WNL except for WBC at 79.6K, RBC at 2.69, Hgb at 7.8, HCT at 25.1, RDW at 16.8, Neutro Abs at 69.1K, Mono Abs at 2.1K, Eos Abs at 0.6K, Abs Immature Granulocytes at 4.55K, BUN at 24, Creatinine at 1.30, Albumin at 3.4, ALP at 349, GFR Est Afr Am at 46. 03/01/2020 LDH at 228  On review of systems, pt denies fatigue, lightheadedness, dizziness, new bone pain, fevers, chills, abdominal pain, abnormal/excessive bleeding, SOB, chest pain and any other symptoms.   MEDICAL HISTORY:  Past Medical History:  Diagnosis Date  . Anemia    with menses  . Asthma   . [place]   . Back pain    status post surgery 2002  . Breast cyst    Excesion with FNA, begnin in 2004.   .[name]  Degenerative joint disease of spine    Imaging 2005,  Degenerative hypertrophic facet arthritis changes L4-5 and L5-S1..   . History of shingles    Recurrent with post herpetic neuralgia.   [name] Hypertension   . Lymphadenopathy    Of the mediastinum, Right side CXR 2008, not read on 2010 cxr.   . Menopause   . Obesity    BMI 54  . Psychosis ([place])    Secondary to prednisone.  . Shingles   . Stasis dermatitis    W/ LE edema, prviously on lasix now on mazxide.   . SVT (supraventricular tachycardia) [facility]) June 2009   one run  while hospitalized  . Tuberculosis    active TB treated in 2002, hx of paraspinal lumbar TB,     SURGICAL HISTORY: Past Surgical History:  Procedure Laterality Date  . BREAST CYST ASPIRATION Left   . Breast cyst biopsy    . CHOLECYSTECTOMY  02/02/2012   Procedure: LAPAROSCOPIC CHOLECYSTECTOMY;  Surgeon: Zenovia Jarred, MD;  Location: [facility];  Service: General;  Laterality: N/A;  . Hemilaminectomy of L4, L5 and S1 decompression of tumor in epidural space  2000  . IR FLUORO GUIDED NEEDLE PLC ASPIRATION/INJECTION LOC  07/17/2019    SOCIAL HISTORY: Social History   Socioeconomic History  . Marital status: Widowed    Spouse name: Not on file  . Number of children: Not on file  . Years of education: Not on file  . Highest education level: Not on file  Occupational History  . 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 Smoking status: Never Smoker  . Smokeless tobacco: Never Used  Vaping Use  . Vaping Use: Never used  Substance and Sexual Activity  . Alcohol use: No    Alcohol/week: 0.0 standard drinks  . Drug use: No  . Sexual activity: Not Currently    Birth control/protection: Post-menopausal  Other Topics Concern  . Not on file  Social History Narrative   Current Social History 03/20/2019        Patient lives alone in a ground floor apartment which is 1 story. There are not steps up to the entrance the patient uses. One son stays with her once in a while.      Patient's method of transportation is personal car.      The highest level of education was some high school: 11th grade.      The patient currently retired.      Identified important Relationships are "My friend Mart Piggs, my nieces, my grandkids, a lot of people.       Pets : None       Interests / Fun: "Playing word games on my phone, watch TV, Talking on phone."       Current Stressors: "Nothing, really. I try not to let anything stress  me."       Religious / Personal Beliefs: "I believe in God."       L. Ducatte, RN, BSN    Social Determinants of Health   Financial Resource Strain: Low Risk   . Difficulty of Paying Living Expenses: Not very hard  Food Insecurity: No Food Insecurity  . Worried About Charity fundraiser in the Last Year: Never true  . Ran Out of Food in the Last Year: Never true  Transportation Needs: Unmet Transportation Needs  . Lack of Transportation (Medical): Yes  . Lack of Transportation (Non-Medical):  No  Physical Activity:   . Days of Exercise per Week:   . Minutes of Exercise per Session:   Stress:   . Feeling of Stress :   Social Connections:   . Frequency of Communication with Friends and Family:   . Frequency of Social Gatherings with Friends and Family:   . Attends Religious Services:   . Active Member of Clubs or Organizations:   . Attends Archivist Meetings:   [name] Marital Status:   Intimate Partner Violence:   . Fear of Current or Ex-Partner:   . Emotionally Abused:   [name] Physically Abused:   . Sexually Abused:     FAMILY HISTORY: Family History  Problem Relation Age of Onset  . Stroke Mother        at young age  . Heart disease Mother   . Heart attack Father   . Heart disease Father   . Cancer Sister        Unknown  . Stroke Brother   . Stroke Brother   . Breast cancer Other   . Sickle cell anemia Brother   . Diabetes Brother   . Stroke Brother     ALLERGIES:  is allergic to prednisone and seroquel [quetiapine fumarate].  MEDICATIONS:  . Current Outpatient Medications:  .  NYAMYC powder, APPLY TOPICALLY TWICE DAILY, Disp: 15 g, Rfl: 3 .  ADVAIR DISKUS 500-50 MCG/DOSE AEPB, Inhale 1 puff into the lungs 2 (two) times daily., Disp: 180 each, Rfl: 3 .  albuterol (VENTOLIN HFA) 108 (90 Base) MCG/ACT inhaler, Inhale 1-2 puffs into the lungs every 6 (six) hours as needed for wheezing or shortness of breath., Disp: 18 g, Rfl: 11 .  allopurinol (ZYLOPRIM) 300  MG tablet, Take 0.5 tablets (150 mg total) by mouth 2 (two) times daily., Disp: 90 tablet, Rfl: 3 .  amLODipine (NORVASC) 10 MG tablet, Take 0.5 tablets (5 mg total) by mouth daily., Disp: 90 tablet, Rfl: 3 .  apixaban (ELIQUIS) 5 MG TABS tablet, Take 1 tablet (5 mg total) by mouth 2 (two) times daily., Disp: 60 tablet, Rfl: 11 .  aspirin EC 81 MG tablet, Take 1 tablet (81 mg total) by mouth daily., Disp: 90 tablet, Rfl: 3 .  Ensure Max Protein (ENSURE MAX PROTEIN) LIQD, Take 330 mLs (11 oz total) by mouth 2 (two) times daily. (Patient taking differently: Take 11 oz by mouth 2 (two) times daily. She drinks regular Ensure not Ensure Max and she prefers chocolate), Disp:  , Rfl:  .  fluticasone (FLONASE) 50 MCG/ACT nasal spray, Place 1 spray into both nostrils daily., Disp: 16 g, Rfl: 3 .  furosemide (LASIX) 40 MG tablet, TAKE 1 TABLET(40 MG) BY MOUTH DAILY, Disp: 90 tablet, Rfl: 1 .  metoprolol tartrate (LOPRESSOR) 50 MG tablet, Take 1 tablet (50 mg total) by mouth 2 (two) times daily., Disp: 60 tablet, Rfl: 11 .  omeprazole (PRILOSEC) 20 MG capsule, Take 1 capsule (20 mg total) by mouth daily., Disp: 90 capsule, Rfl: 3 .  potassium chloride (KLOR-CON) 10 MEQ tablet, Take 2 tablets (20 mEq total) by mouth daily for 5 days., Disp: 10 tablet, Rfl: 0 .  pravastatin (PRAVACHOL) 20 MG tablet, Take 1 tablet (20 mg total) by mouth daily., Disp: 90 tablet, Rfl: 3 .  sodium chloride (OCEAN) 0.65 % SOLN nasal spray, Place 1 spray into both nostrils as needed for congestion., Disp: 88 mL, Rfl: 0   REVIEW OF SYSTEMS:   A 10+ POINT REVIEW OF  SYSTEMS WAS OBTAINED including neurology, dermatology, psychiatry, cardiac, respiratory, lymph, extremities, GI, GU, Musculoskeletal, constitutional, breasts, reproductive, HEENT.  All pertinent positives are noted in the HPI.  All others are negative.   PHYSICAL EXAMINATION:  Vitals:   03/01/20 1011  BP: (!) 130/62  Pulse: 79  Resp: 18  Temp: 98.1 F (36.7 C)    TempSrc: Temporal  SpO2: 97%  Weight: (!) 258 lb 1.6 oz (117.1 kg)  Height: _0  (1.702 m)  .  Exam was given in a chair   GENERAL:alert, in no acute distress and comfortable SKIN: no acute rashes, no significant lesions EYES: conjunctiva are pink and non-injected, sclera anicteric OROPHARYNX: MMM, no exudates, no oropharyngeal erythema or ulceration NECK: supple, no JVD LYMPH:  no palpable lymphadenopathy in the cervical, axillary or inguinal regions LUNGS: clear to auscultation b/l with normal respiratory effort HEART: regular rate & rhythm ABDOMEN:  normoactive bowel sounds , non tender, not distended. No palpable hepatosplenomegaly.  Extremity: trace pedal edema b/l PSYCH: alert & oriented x 3 with fluent speech NEURO: no focal motor/sensory deficits  LABORATORY DATA:  I have reviewed the data as listed   .       CBC Latest Ref Rng & Units 03/01/2020 11/30/2019 10/28/2019  WBC 4.0 - 10.5 K/uL 79.6(HH) 84.4(HH) 117.3(HH)  Hemoglobin 12.0 - 15.0 g/dL 7.8(L) 8.4(L) 7.6(L)  Hematocrit 36 - 46 % 25.1(L) 27.3(L) 24.3(L)  Platelets 150 - 400 K/uL 170 151 365   . CBC    Component Value Date/Time   WBC 79.6 (HH) 03/01/2020 0934   RBC 2.69 (L) 03/01/2020 0934   HGB 7.8 (L) 03/01/2020 0934   HGB 11.4 (L) 03/06/2018 0910   HGB 11.3 (L) 07/24/2017 1402   HCT 25.1 (L) 03/01/2020 0934   HCT 27.7 (L) 07/12/2019 0129   HCT 36.0 07/24/2017 1402   PLT 170 03/01/2020 0934   PLT 166 03/06/2018 0910   PLT 172 07/24/2017 1402   MCV 93.3 03/01/2020 0934   MCV 93.3 07/24/2017 1402   MCH 29.0 03/01/2020 0934   MCHC 31.1 03/01/2020 0934   RDW 16.8 (H) 03/01/2020 0934   RDW 15.6 (H) 07/24/2017 1402   LYMPHSABS 3.1 03/01/2020 0934   LYMPHSABS 2.9 07/24/2017 1402   MONOABS 2.1 (H) 03/01/2020 0934   MONOABS 1.8 (H) 07/24/2017 1402   EOSABS 0.6 (H) 03/01/2020 0934   EOSABS 1.1 (H) 07/24/2017 1402   BASOSABS 0.1 03/01/2020 0934   BASOSABS 0.2 (H) 07/24/2017 1402   . CMP Latest  Ref Rng & Units 03/01/2020 11/30/2019 11/24/2019  Glucose 70 - 99 mg/dL 84 92 61(L)  BUN 8 - 23 mg/dL 24(H) 18 17  Creatinine 0.44 - 1.00 mg/dL 1.30(H) 1.03(H) 0.99  Sodium 135 - 145 mmol/L 139 140 137  Potassium 3.5 - 5.1 mmol/L 4.4 3.4(L) 4.4  Chloride 98 - 111 mmol/L 103 105 98  CO2 22 - 32 mmol/L _1 Calcium 8.9 - 10.3 mg/dL 9.9 9.0 9.5  Total Protein 6.5 - 8.1 g/dL 8.0 7.7 -  Total Bilirubin 0.3 - 1.2 mg/dL 0.3 0.3 -  Alkaline Phos 38 - 126 U/L 349(H) 327(H) -  AST 15 - 41 U/L 17 13(L) -  ALT 0 - 44 U/L 11 7 -    RADIOGRAPHIC STUDIES: I have personally reviewed the radiological images as listed and agreed with the findings in the report. No results found.  ASSESSMENT & PLAN:   Erika [name] is a delightful, well-appearing 79 y.o. woman with   #  1 Leukocytosis?predominantly Neutrophilia with some monocytosis CMML vs MPN NOS with chek 2 mutation Patient initially presented with a WBC count of 55.2k with 48.8k neutrophils.  WBC counts relatively stable today.  Peripheral blood smear personally previously reviewed-shows significant neutrophilia with left shift.  Increased bands with a few myelocytes and metamyelocytes.  Pelgeroid neutrophils.  No significant increased blasts.  Monocytosis present.  CT abdomen done for workup of her back pain did not show any acute intra-abdominal or pelvic findings. He does not have any overt splenomegaly on her CT abdomen -which is somewhat argues against a chronic myeloproliferative disorder though does not rule this out.  MRI of the lumbar spine shows old surgical and degenerative changes but no evidence of tumor or infectious collections.  Leucocytosis - improved at 34.3k today. No fevers/chills or other constitutional symptoms  Foundation One heme with Chek 2 mutation. No other findings noted.  CT chest on 05/31/2017: IMPRESSION: 1. Mild ground-glass opacities suggest mild pulmonary edema. Small bilateral pleural effusions and  basilar atelectasis. No pulmonary infiltrate. Mild subcarinal mediastinal adenopathy is likely reactive. Cardiomegaly.  JAK2 and BCR-ABL mutations neg. LDH levels were within normal limits and did not suggest a high-grade lymphoma.  06/24/17 BM bx- this showed hypercellular bone marrow with granulocytic proliferation. Cytogenetic analysis was normal.   #2 mild normocytic anemia. HGb at 11.4 today   PLAN:  -Discussed pt labwork today, 03/01/20; mild anemia, WBC came down, PLT are nml, blood chemistries reflect dehydration, LDH is stable -No lab or clinical evidence of transformation of pt's chronic MPN at this time.  -Advised pt that when Hgb <8 we would offer a blood transfusion to improve symptomatic anemia. Pt does not appear to be symptomatic currently and prefers not to receive a blood transfusion at this time.  -Will continue to watch labs and give blood transfusions prn. -Advised pt to contact if she begins feeling significantly fatigued, lightheaded, or dizzy.  -Recommended that the pt continue to eat well and drink at least 48-64 oz of water each day. -Recommend pt take a multivitamin or B-complex vitamin daily  -Will see back in 3 months with labs    FOLLOW UP: RTC with Dr Irene Limbo with labs in 3 months Schedule for 1 unit of PRBC with f/u visit in 3 months if needed   The total time spent in the appt was 20 minutes and more than 50% was on counseling and direct patient cares.  All of the patient's questions were answered with apparent satisfaction. The patient knows to call the clinic with any problems, questions or concerns.   Sullivan Lone MD [place] AAHIVMS [facility] Hematology/Oncology Physician [facility]  (Office):       [phone] (Work cell):  [phone] (Fax):           [phone]   I, Yevette Edwards, am acting as a scribe for Dr. Sullivan Lone.   .I have reviewed the above documentation for accuracy and completeness, and I agree with the  above. Brunetta Genera MD

## 2020-03-01 ENCOUNTER — Ambulatory Visit: Payer: PPO | Admitting: *Deleted

## 2020-03-01 ENCOUNTER — Telehealth: Payer: Self-pay | Admitting: Hematology

## 2020-03-01 ENCOUNTER — Telehealth: Payer: Self-pay

## 2020-03-01 ENCOUNTER — Inpatient Hospital Stay: Payer: PPO

## 2020-03-01 ENCOUNTER — Other Ambulatory Visit: Payer: Self-pay

## 2020-03-01 ENCOUNTER — Inpatient Hospital Stay: Payer: PPO | Attending: Hematology | Admitting: Hematology

## 2020-03-01 VITALS — BP 130/62 | HR 79 | Temp 98.1°F | Resp 18 | Ht 67.0 in | Wt 258.1 lb

## 2020-03-01 DIAGNOSIS — D72829 Elevated white blood cell count, unspecified: Secondary | ICD-10-CM | POA: Insufficient documentation

## 2020-03-01 DIAGNOSIS — D649 Anemia, unspecified: Secondary | ICD-10-CM

## 2020-03-01 DIAGNOSIS — D471 Chronic myeloproliferative disease: Secondary | ICD-10-CM | POA: Diagnosis not present

## 2020-03-01 DIAGNOSIS — I10 Essential (primary) hypertension: Secondary | ICD-10-CM

## 2020-03-01 DIAGNOSIS — I480 Paroxysmal atrial fibrillation: Secondary | ICD-10-CM

## 2020-03-01 DIAGNOSIS — J4540 Moderate persistent asthma, uncomplicated: Secondary | ICD-10-CM

## 2020-03-01 DIAGNOSIS — I5032 Chronic diastolic (congestive) heart failure: Secondary | ICD-10-CM

## 2020-03-01 LAB — CMP (CANCER CENTER ONLY)
ALT: 11 U/L (ref 0–44)
AST: 17 U/L (ref 15–41)
Albumin: 3.4 g/dL — ABNORMAL LOW (ref 3.5–5.0)
Alkaline Phosphatase: 349 U/L — ABNORMAL HIGH (ref 38–126)
Anion gap: 9 (ref 5–15)
BUN: 24 mg/dL — ABNORMAL HIGH (ref 8–23)
CO2: 27 mmol/L (ref 22–32)
Calcium: 9.9 mg/dL (ref 8.9–10.3)
Chloride: 103 mmol/L (ref 98–111)
Creatinine: 1.3 mg/dL — ABNORMAL HIGH (ref 0.44–1.00)
GFR, Est AFR Am: 46 mL/min — ABNORMAL LOW (ref >60)
GFR, Estimated: 40 mL/min — ABNORMAL LOW (ref >60)
Glucose, Bld: 84 mg/dL (ref 70–99)
Potassium: 4.4 mmol/L (ref 3.5–5.1)
Sodium: 139 mmol/L (ref 135–145)
Total Bilirubin: 0.3 mg/dL (ref 0.3–1.2)
Total Protein: 8 g/dL (ref 6.5–8.1)

## 2020-03-01 LAB — CBC WITH DIFFERENTIAL/PLATELET
Abs Immature Granulocytes: 4.55 10*3/uL — ABNORMAL HIGH (ref 0.00–0.07)
Basophils Absolute: 0.1 10*3/uL (ref 0.0–0.1)
Basophils Relative: 0 %
Eosinophils Absolute: 0.6 10*3/uL — ABNORMAL HIGH (ref 0.0–0.5)
Eosinophils Relative: 1 %
HCT: 25.1 % — ABNORMAL LOW (ref 36.0–46.0)
Hemoglobin: 7.8 g/dL — ABNORMAL LOW (ref 12.0–15.0)
Immature Granulocytes: 6 %
Lymphocytes Relative: 4 %
Lymphs Abs: 3.1 10*3/uL (ref 0.7–4.0)
MCH: 29 pg (ref 26.0–34.0)
MCHC: 31.1 g/dL (ref 30.0–36.0)
MCV: 93.3 fL (ref 80.0–100.0)
Monocytes Absolute: 2.1 10*3/uL — ABNORMAL HIGH (ref 0.1–1.0)
Monocytes Relative: 3 %
Neutro Abs: 69.1 10*3/uL — ABNORMAL HIGH (ref 1.7–7.7)
Neutrophils Relative %: 86 %
Platelets: 170 10*3/uL (ref 150–400)
RBC: 2.69 MIL/uL — ABNORMAL LOW (ref 3.87–5.11)
RDW: 16.8 % — ABNORMAL HIGH (ref 11.5–15.5)
WBC: 79.6 10*3/uL (ref 4.0–10.5)
nRBC: 0 % (ref 0.0–0.2)

## 2020-03-01 LAB — SAMPLE TO BLOOD BANK

## 2020-03-01 LAB — LACTATE DEHYDROGENASE: LDH: 228 U/L — ABNORMAL HIGH (ref 98–192)

## 2020-03-01 NOTE — Telephone Encounter (Signed)
CRITICAL VALUE STICKER  CRITICAL VALUE: WBC 79.6  RECEIVER (on-site recipient of call): Wendall Mola, RN  DATE & TIME NOTIFIED: 03/01/20 @ 1002  MESSENGER (representative from lab): Newt Minion, MT  MD NOTIFIED: Dr. Sullivan Lone  TIME OF NOTIFICATION: 1005  RESPONSE: Acknowledged. No new orders received.

## 2020-03-01 NOTE — Chronic Care Management (AMB) (Addendum)
  Chronic Care Management   Note  03/01/2020 Name: Erika [name] MRN: [id number] DOB: 16-Dec-1940  Had planned to meet with patient prior to her appointment at the [facility] to provide her with batteries for her scale so she can continue with daily weights and discuss troubleshooting if battery replacement does not resolve the malfunction.. This CCM RN stayed until 5 minutes prior to her 9:15 am appointment but patient had not arrived. She later called to thank this CCM RN for delivering the batteries  Follow up plan: Telephone follow up appointment with care management team member scheduled for:03/03/20 at 1:00 pm to complete follow up assessment.  Kelli Churn RN, CCM, [facility] RN Care Manager [phone]

## 2020-03-01 NOTE — Telephone Encounter (Signed)
Per Dr. Irene Limbo, no blood transfusion required today. Infusion RN made aware.

## 2020-03-01 NOTE — Telephone Encounter (Signed)
Scheduled per 07/27 los, patient has been called and voicemail was left. 

## 2020-03-03 ENCOUNTER — Telehealth: Payer: PPO

## 2020-03-03 ENCOUNTER — Telehealth: Payer: Self-pay | Admitting: *Deleted

## 2020-03-03 NOTE — Telephone Encounter (Signed)
  Chronic Care Management   Outreach Note  03/03/2020 Name: Erika [name] MRN: [id number] DOB: 1941/02/19  Referred by: [name], MD Reason for referral : Chronic Care Management (HF,HTN,Asthma)   An unsuccessful telephone outreach was attempted today. The patient was referred to the case management team for assistance with care management and care coordination.   Follow Up Plan: A HIPPA compliant phone message was left for the patient providing contact information and requesting a return call.  If no return call from patient, the care management team will reach out to the patient again over the next 7-10 days.   Kelli Churn RN, CCM, [facility] RN Care Manager [phone]

## 2020-03-10 ENCOUNTER — Telehealth: Payer: Self-pay | Admitting: *Deleted

## 2020-03-10 ENCOUNTER — Telehealth: Payer: PPO

## 2020-03-10 NOTE — Telephone Encounter (Signed)
°  Chronic Care Management   Outreach Note  03/10/2020 Name: Erika [name] MRN: [id number] DOB: 1940-12-04  Referred by: [name], MD Reason for referral : Chronic Care Management (HF,  HTN, A fib, Asthma)   A second unsuccessful telephone outreach was attempted today. The patient was referred to the case management team for assistance with care management and care coordination.   Follow Up Plan: A HIPPA compliant phone message was left for the patient providing contact information and requesting a return call.  The care management team will reach out to the patient again over the next 7-10 days.   Kelli Churn RN, CCM, [facility] RN Care Manager [phone]

## 2020-03-17 ENCOUNTER — Telehealth: Payer: PPO

## 2020-03-17 ENCOUNTER — Ambulatory Visit: Payer: PPO | Admitting: *Deleted

## 2020-03-17 DIAGNOSIS — I480 Paroxysmal atrial fibrillation: Secondary | ICD-10-CM

## 2020-03-17 DIAGNOSIS — J4540 Moderate persistent asthma, uncomplicated: Secondary | ICD-10-CM

## 2020-03-17 DIAGNOSIS — I5032 Chronic diastolic (congestive) heart failure: Secondary | ICD-10-CM

## 2020-03-17 DIAGNOSIS — I10 Essential (primary) hypertension: Secondary | ICD-10-CM

## 2020-03-17 NOTE — Chronic Care Management (AMB) (Signed)
Chronic Care Management   Follow Up Note   03/17/2020 Name: Erika [name] MRN: [id number] DOB: 12/10/1940  Referred by: [name], MD Reason for referral : Chronic Care Management (HF, HTN, Afib, asthma)   RABECKA [name] is a 79 y.o. year old female who is a primary care patient of [name], MD. The CCM team was consulted for assistance with chronic disease management and care coordination needs.    Review of patient status, including review of consultants reports, relevant laboratory and other test results, and collaboration with appropriate care team members and the patient's provider was performed as part of comprehensive patient evaluation and provision of chronic care management services.    SDOH (Social Determinants of Health) assessments performed: No See Care Plan activities for detailed interventions related to [facility])     Outpatient Encounter Medications as of 03/17/2020  Medication Sig Note  . NYAMYC powder APPLY TOPICALLY TWICE DAILY   . ADVAIR DISKUS 500-50 MCG/DOSE AEPB Inhale 1 puff into the lungs 2 (two) times daily.   [name] albuterol (VENTOLIN HFA) 108 (90 Base) MCG/ACT inhaler Inhale 1-2 puffs into the lungs every 6 (six) hours as needed for wheezing or shortness of breath. 12/01/2019: States she uses once daily not prn- clarified purpose and use of rescue inhaler  . allopurinol (ZYLOPRIM) 300 MG tablet Take 0.5 tablets (150 mg total) by mouth 2 (two) times daily.   [name] amLODipine (NORVASC) 10 MG tablet Take 0.5 tablets (5 mg total) by mouth daily.   [name] apixaban (ELIQUIS) 5 MG TABS tablet Take 1 tablet (5 mg total) by mouth 2 (two) times daily.   [name] aspirin EC 81 MG tablet Take 1 tablet (81 mg total) by mouth daily.   . Ensure Max Protein (ENSURE MAX PROTEIN) LIQD Take 330 mLs (11 oz total) by mouth 2 (two) times daily. (Patient taking differently: Take 11 oz by mouth 2 (two) times daily. She drinks regular Ensure not Ensure Max and she prefers chocolate) 12/01/2019:  Will contact [employer] rep for coupons and/or free samples  . fluticasone (FLONASE) 50 MCG/ACT nasal spray Place 1 spray into both nostrils daily.   . furosemide (LASIX) 40 MG tablet TAKE 1 TABLET(40 MG) BY MOUTH DAILY   . metoprolol tartrate (LOPRESSOR) 50 MG tablet Take 1 tablet (50 mg total) by mouth 2 (two) times daily. 12/01/2019: Says she is taking 1/2 of a 25 mg tablet once daily- will clarify dosage with provider  . omeprazole (PRILOSEC) 20 MG capsule Take 1 capsule (20 mg total) by mouth daily.   . potassium chloride (KLOR-CON) 10 MEQ tablet Take 2 tablets (20 mEq total) by mouth daily for 5 days. 12/01/2019: Patient states she is not taking K+   . pravastatin (PRAVACHOL) 20 MG tablet Take 1 tablet (20 mg total) by mouth daily.   . sodium chloride (OCEAN) 0.65 % SOLN nasal spray Place 1 spray into both nostrils as needed for congestion.    No facility-administered encounter medications on file as of 03/17/2020.     Objective:  Wt Readings from Last 3 Encounters:  03/01/20 (!) 258 lb 1.6 oz (117.1 kg)  11/30/19 251 lb 11.2 oz (114.2 kg)  11/24/19 248 lb 14.4 oz (112.9 kg)    Goals Addressed              This Visit's Progress     Patient Stated   .  "I feel so much better now that I've completed home health physical therapy and  can drive again. " (pt-stated)        CARE PLAN ENTRY (see longitudinal plan of care for additional care plan information)   Current Barriers:  [name] Knowledge deficit related to basic heart failure pathophysiology and self care management- patient states she continues to do well, is very pleased that she is able to drive again and can walk to her car without dyspnea. She continues to voice good understanding of her treatment plan for HF and, HTN a fib and asthma, she says she continues to weigh herself daily and record her weights in the [facility] calendar this CCM RN mailed to her. She says she saw her oncologist on 7/27 and  received a good report, she says she is wearing her mask when she is outside the home and has received both vaccinations. Says she has no other routine appointments scheduled for now and will see her primary care provider in October.  Case Manager Clinical Goal(s):  [name] Over the next 30 - 60 days, patient will weigh self daily and record- met . Over the next 30 - 60 days, patient will verbalize understanding of Heart Failure Action Plan and when to call doctor- met . Over the next 30 - 60 days, patient will take all Heart Failure mediations as prescribed- not met patient taking metoprolol only daily not twice daily and is taking 1/2 of table, not a whole tablet . Over the next 30 - 60 days, patient will weigh daily and record (notifying MD of 3 lb weight gain over night or 5 lb in a week)- met  Interventions:  . Reviewed Heart Failure, asthma, a fib and HTN  treatment plans  . Reviewed medications with patient and discussed medication taking behavior . Discussed plans with patient for ongoing care management follow up and provided patient with direct contact information for care management team . Reviewed dry weight established as 248 lbs on 11/24/19 and reviewed weight gain parameters indicating need for  provider notification  . Reviewed role of diuretics in prevention of fluid overload and management of heart failure . Provided positive reinforcement for patient's excellent disease self management skills . Reviewed scheduled/upcoming provider appointments   Patient Self Care Activities:  . Takes Heart Failure Medications as prescribed . Weighs daily and record (notifying MD of 3 lb weight gain over night or 5 lb in a week) . Verbalizes understanding of and follows CHF Action Plan . Adheres to low sodium diet, heart healthy diet  Please see past updates related to this goal by clicking on the "Past Updates" button in the selected goal          Plan:   The care management team will reach  out to the patient again over the next 30-60 days.    Kelli Churn RN, CCM, [facility] RN Care Manager [phone]

## 2020-03-17 NOTE — Patient Instructions (Signed)
Visit Information It was nice speaking with you today.  I am glad you continue to feel well.  Goals Addressed              This Visit's Progress     Patient Stated     "I feel so much better now that I've completed home health physical therapy and can drive again. " (pt-stated)        CARE PLAN ENTRY (see longitudinal plan of care for additional care plan information)   Current Barriers:   Knowledge deficit related to basic heart failure pathophysiology and self care management- patient states she continues to do well, is very pleased that she is able to drive again and can walk to her car without dyspnea. She continues to voice good understanding of her treatment plan for HF and, HTN a fib and asthma, she says she continues to weigh herself daily and record her weights in the [facility] calendar this CCM RN mailed to her. She says she saw her oncologist on 7/27 and received a good report, she says she is wearing her mask when she is outside the home and has received both vaccinations. Says she has no other routine appointments scheduled for now and will see her primary care provider in October.  Case Manager Clinical Goal(s):   Over the next 30 - 60 days, patient will weigh self daily and record- met  Over the next 30 - 60 days, patient will verbalize understanding of Heart Failure Action Plan and when to call doctor- met  Over the next 30 - 60 days, patient will take all Heart Failure mediations as prescribed- not met patient taking metoprolol only daily not twice daily and is taking 1/2 of table, not a whole tablet  Over the next 30 - 60 days, patient will weigh daily and record (notifying MD of 3 lb weight gain over night or 5 lb in a week)- met  Interventions:   Reviewed Heart Failure, asthma, a fib and HTN  treatment plans   Reviewed medications with patient and discussed medication taking behavior  Discussed plans with patient for ongoing  care management follow up and provided patient with direct contact information for care management team  Reviewed dry weight established as 248 lbs on 11/24/19 and reviewed weight gain parameters indicating need for  provider notification   Reviewed role of diuretics in prevention of fluid overload and management of heart failure  Provided positive reinforcement for patient's excellent disease self management skills  Reviewed scheduled/upcoming provider appointments   Patient Self Care Activities:   Takes Heart Failure Medications as prescribed  Weighs daily and record (notifying MD of 3 lb weight gain over night or 5 lb in a week)  Verbalizes understanding of and follows CHF Action Plan  Adheres to low sodium diet, heart healthy diet  Please see past updates related to this goal by clicking on the "Past Updates" button in the selected goal         The patient verbalized understanding of instructions provided today and declined a print copy of patient instruction materials.   The care management team will reach out to the patient again over the next 30-60 days.   Kelli Churn RN, CCM, [facility] RN Care Manager [phone]

## 2020-03-18 NOTE — Progress Notes (Signed)
Internal Medicine Clinic Resident  I have personally reviewed this encounter including the documentation in this note and/or discussed this patient with the care management provider. I will address any urgent items identified by the care management provider and will communicate my actions to the patient's PCP. I have reviewed the patient's CCM visit with my supervising attending, Dr Heber [place].  Sanjuana Letters, MD 03/18/2020

## 2020-03-23 ENCOUNTER — Other Ambulatory Visit: Payer: Self-pay

## 2020-03-23 NOTE — Telephone Encounter (Signed)
furosemide (LASIX) 40 MG tablet, REFILL REQUEST @  [facility] [phone] - [place], [address] Phone:  [phone]  Fax:  [phone]

## 2020-03-24 MED ORDER — FUROSEMIDE 40 MG PO TABS: 40.0000 mg | ORAL_TABLET | Freq: Every day | ORAL | 1 refills | Status: DC

## 2020-03-25 DIAGNOSIS — R531 Weakness: Secondary | ICD-10-CM | POA: Diagnosis not present

## 2020-03-25 DIAGNOSIS — I5033 Acute on chronic diastolic (congestive) heart failure: Secondary | ICD-10-CM | POA: Diagnosis not present

## 2020-03-25 DIAGNOSIS — R4182 Altered mental status, unspecified: Secondary | ICD-10-CM | POA: Diagnosis not present

## 2020-03-26 DIAGNOSIS — I509 Heart failure, unspecified: Secondary | ICD-10-CM | POA: Diagnosis not present

## 2020-04-25 DIAGNOSIS — R531 Weakness: Secondary | ICD-10-CM | POA: Diagnosis not present

## 2020-04-25 DIAGNOSIS — I5033 Acute on chronic diastolic (congestive) heart failure: Secondary | ICD-10-CM | POA: Diagnosis not present

## 2020-04-25 DIAGNOSIS — R4182 Altered mental status, unspecified: Secondary | ICD-10-CM | POA: Diagnosis not present

## 2020-04-26 DIAGNOSIS — I509 Heart failure, unspecified: Secondary | ICD-10-CM | POA: Diagnosis not present

## 2020-05-10 ENCOUNTER — Telehealth: Payer: Self-pay | Admitting: *Deleted

## 2020-05-10 ENCOUNTER — Other Ambulatory Visit: Payer: Self-pay | Admitting: Internal Medicine

## 2020-05-10 ENCOUNTER — Telehealth: Payer: PPO

## 2020-05-10 NOTE — Telephone Encounter (Signed)
  Chronic Care Management   Outreach Note  05/10/2020 Name: Erika [name] MRN: [id number] DOB: June 22, 1941  Referred by: [name], MD Reason for referral : Chronic Care Management (HF, HTN, AFib, Asthma, myeloproliferative neoplasm)   An unsuccessful telephone outreach was attempted today. The patient was referred to the case management team for assistance with care management and care coordination.   Follow Up Plan: A HIPAA compliant phone message was left for the patient providing contact information and requesting a return call.  The care management team will reach out to the patient again over the next 7-10 days.   Kelli Churn RN, CCM, [facility] RN Care Manager [phone]

## 2020-05-11 ENCOUNTER — Ambulatory Visit: Payer: PPO

## 2020-05-11 NOTE — Telephone Encounter (Signed)
Needs labs for K prior to filling.

## 2020-05-12 ENCOUNTER — Ambulatory Visit (INDEPENDENT_AMBULATORY_CARE_PROVIDER_SITE_OTHER): Payer: PPO | Admitting: *Deleted

## 2020-05-12 ENCOUNTER — Other Ambulatory Visit: Payer: Self-pay

## 2020-05-12 DIAGNOSIS — Z23 Encounter for immunization: Secondary | ICD-10-CM | POA: Diagnosis not present

## 2020-05-17 ENCOUNTER — Telehealth: Payer: PPO

## 2020-05-17 ENCOUNTER — Telehealth: Payer: Self-pay | Admitting: *Deleted

## 2020-05-17 NOTE — Telephone Encounter (Signed)
°  Chronic Care Management   Outreach Note  05/17/2020 Name: Erika [name] MRN: [id number] DOB: March 05, 1941  Referred by: [name], MD Reason for referral : Chronic Care Management (HF, HTN, AFib, Asthma, myeloproliferative neoplasm)   A second unsuccessful telephone outreach was attempted today. The patient was referred to the case management team for assistance with care management and care coordination.   Follow Up Plan: A HIPAA compliant phone message was left for the patient providing contact information and requesting a return call.  The care management team will reach out to the patient again over the next 7-10 days.   Kelli Churn RN, CCM, [facility] RN Care Manager [phone]

## 2020-05-20 ENCOUNTER — Telehealth: Payer: Self-pay

## 2020-05-20 MED ORDER — FLUTICASONE PROPIONATE 50 MCG/ACT NA SUSP
1.0000 | Freq: Every day | NASAL | 3 refills | Status: DC
Start: 2020-05-20 — End: 2020-05-23

## 2020-05-20 NOTE — Telephone Encounter (Signed)
Refilled flonase.  Agree potassium not indicated without blood work and visit.

## 2020-05-20 NOTE — Telephone Encounter (Addendum)
Terri from [facility] had called in asking for refill on potassium. Explained this med was refused by PCP on 05/10/2020 as patient would need blood work prior to refilling. Of note, only 10 tabs with no refills were sent on 10/01/2019. There was no mention of Flonase in our conversation.  Hubbard Hartshorn, BSN, RN-BC

## 2020-05-20 NOTE — Telephone Encounter (Signed)
Terrie with [facility] called and states she just spoke to Lauren and needs to speak to her again.  Something about K=, labwork, and flonase. No notes in yet, but she states she just hung up with you. SChaplin, RN,BSN

## 2020-05-23 ENCOUNTER — Other Ambulatory Visit: Payer: Self-pay | Admitting: Internal Medicine

## 2020-05-23 ENCOUNTER — Ambulatory Visit (INDEPENDENT_AMBULATORY_CARE_PROVIDER_SITE_OTHER): Payer: PPO | Admitting: Internal Medicine

## 2020-05-23 ENCOUNTER — Encounter: Payer: Self-pay | Admitting: Internal Medicine

## 2020-05-23 ENCOUNTER — Other Ambulatory Visit: Payer: Self-pay

## 2020-05-23 VITALS — BP 134/67 | HR 74 | Temp 97.5°F | Ht 67.0 in | Wt 266.2 lb

## 2020-05-23 DIAGNOSIS — I5032 Chronic diastolic (congestive) heart failure: Secondary | ICD-10-CM | POA: Diagnosis not present

## 2020-05-23 DIAGNOSIS — R062 Wheezing: Secondary | ICD-10-CM

## 2020-05-23 DIAGNOSIS — M7989 Other specified soft tissue disorders: Secondary | ICD-10-CM

## 2020-05-23 DIAGNOSIS — I110 Hypertensive heart disease with heart failure: Secondary | ICD-10-CM

## 2020-05-23 LAB — BRAIN NATRIURETIC PEPTIDE: B Natriuretic Peptide: 1123.9 pg/mL — ABNORMAL HIGH (ref 0.0–100.0)

## 2020-05-23 MED ORDER — FUROSEMIDE 40 MG PO TABS: 40.0000 mg | ORAL_TABLET | Freq: Two times a day (BID) | ORAL | 0 refills | Status: DC

## 2020-05-23 MED ORDER — FLUTICASONE PROPIONATE 50 MCG/ACT NA SUSP
1.0000 | Freq: Every day | NASAL | 3 refills | Status: DC
Start: 2020-05-23 — End: 2020-05-25

## 2020-05-23 NOTE — Patient Instructions (Signed)
Thank you, [name] for allowing [place] to provide your care today. Today we discussed lower extremity swelling.    I have ordered the following labs for you:   Lab Orders     BMP8+Anion Gap     Magnesium     Brain natriuretic peptide   I will call if any are abnormal. All of your labs can be accessed through "My Chart".  Referrals and tests ordered today:   Referral Orders  No referral(s) requested today     I have ordered the following medication/changed the following medications:   Medications Discontinued During This Encounter  Medication Reason   furosemide (LASIX) 40 MG tablet      Meds ordered this encounter  Medications   furosemide (LASIX) 40 MG tablet    Sig: Take 1 tablet (40 mg total) by mouth 2 (two) times daily for 5 days.    Dispense:  10 tablet    Refill:  0     Follow up: 1 week (Friday)    Remember:   Should you have any questions or concerns please call the internal medicine clinic at [phone].     Marianna Payment, D.O. [place]

## 2020-05-23 NOTE — Progress Notes (Signed)
CC: Lower extremity edema  HPI:  [name] is a 79 y.o. female with a past medical history stated below and presents today for lower extremity edema. Please see problem based assessment and plan for additional details.  Past Medical History:  Diagnosis Date  . Anemia    with menses  . Asthma   . Atrial flutter, paroxysmal ([place])   . Back pain    status post surgery 2002  . Breast cyst    Excesion with FNA, begnin in 2004.   . Degenerative joint disease of spine    Imaging 2005,  Degenerative hypertrophic facet arthritis changes L4-5 and L5-S1..   . History of shingles    Recurrent with post herpetic neuralgia.   [name] Hypertension   . Lymphadenopathy    Of the mediastinum, Right side CXR 2008, not read on 2010 cxr.   . Menopause   . Obesity    BMI 54  . Psychosis ([place])    Secondary to prednisone.  . Shingles   . Stasis dermatitis    W/ LE edema, prviously on lasix now on mazxide.   . SVT (supraventricular tachycardia) [facility]) June 2009   one run while hospitalized  . Tuberculosis    active TB treated in 2002, hx of paraspinal lumbar TB,     Current Outpatient Medications on File Prior to Visit  Medication Sig Dispense Refill  . ADVAIR DISKUS 500-50 MCG/DOSE AEPB Inhale 1 puff into the lungs 2 (two) times daily. 180 each 3  . albuterol (VENTOLIN HFA) 108 (90 Base) MCG/ACT inhaler Inhale 1-2 puffs into the lungs every 6 (six) hours as needed for wheezing or shortness of breath. 18 g 11  . allopurinol (ZYLOPRIM) 300 MG tablet Take 0.5 tablets (150 mg total) by mouth 2 (two) times daily. 90 tablet 3  . amLODipine (NORVASC) 10 MG tablet Take 0.5 tablets (5 mg total) by mouth daily. 90 tablet 3  . apixaban (ELIQUIS) 5 MG TABS tablet Take 1 tablet (5 mg total) by mouth 2 (two) times daily. 60 tablet 11  . aspirin EC 81 MG tablet Take 1 tablet (81 mg total) by mouth daily. 90 tablet 3  . Ensure Max Protein (ENSURE MAX PROTEIN) LIQD Take 330 mLs (11 oz total) by mouth 2  (two) times daily. (Patient taking differently: Take 11 oz by mouth 2 (two) times daily. She drinks regular Ensure not Ensure Max and she prefers chocolate)    . fluticasone (FLONASE) 50 MCG/ACT nasal spray Place 1 spray into both nostrils daily. 16 g 3  . metoprolol tartrate (LOPRESSOR) 50 MG tablet Take 1 tablet (50 mg total) by mouth 2 (two) times daily. 60 tablet 11  . NYAMYC powder APPLY TOPICALLY TWICE DAILY 15 g 3  . omeprazole (PRILOSEC) 20 MG capsule Take 1 capsule (20 mg total) by mouth daily. 90 capsule 3  . potassium chloride (KLOR-CON) 10 MEQ tablet Take 2 tablets (20 mEq total) by mouth daily for 5 days. 10 tablet 0  . pravastatin (PRAVACHOL) 20 MG tablet Take 1 tablet (20 mg total) by mouth daily. 90 tablet 3  . sodium chloride (OCEAN) 0.65 % SOLN nasal spray Place 1 spray into both nostrils as needed for congestion. 88 mL 0   No current facility-administered medications on file prior to visit.    Family History  Problem Relation Age of Onset  . Stroke Mother        at young age  . Heart disease Mother   .  Heart attack Father   . Heart disease Father   . Cancer Sister        Unknown  . Stroke Brother   . Stroke Brother   . Breast cancer Other   . Sickle cell anemia Brother   . Diabetes Brother   . Stroke Brother     Social History   Socioeconomic History  . Marital status: Widowed    Spouse name: Not on file  . Number of children: Not on file  . Years of education: Not on file  . Highest education level: Not on file  Occupational History  . 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 Smoking status: Never Smoker  . Smokeless tobacco: Never Used  Vaping Use  . Vaping Use: Never used  Substance and Sexual Activity  . Alcohol use: No    Alcohol/week: 0.0 standard drinks  . Drug use: No  . Sexual activity: Not Currently    Birth control/protection: Post-menopausal  Other Topics Concern  . Not  on file  Social History Narrative   Current Social History 03/20/2019        Patient lives alone in a ground floor apartment which is 1 story. There are not steps up to the entrance the patient uses. One son stays with her once in a while.      Patient's method of transportation is personal car.      The highest level of education was some high school: 11th grade.      The patient currently retired.      Identified important Relationships are "My friend Mart Piggs, my nieces, my grandkids, a lot of people.       Pets : None       Interests / Fun: "Playing word games on my phone, watch TV, Talking on phone."       Current Stressors: "Nothing, really. I try not to let anything stress me."       Religious / Personal Beliefs: "I believe in God."       L. [place], RN, BSN    Social Determinants of Health   Financial Resource Strain: Low Risk   . Difficulty of Paying Living Expenses: Not very hard  Food Insecurity: No Food Insecurity  . Worried About Charity fundraiser in the Last Year: Never true  . Ran Out of Food in the Last Year: Never true  Transportation Needs: Unmet Transportation Needs  . Lack of Transportation (Medical): Yes  . Lack of Transportation (Non-Medical): No  Physical Activity:   . Days of Exercise per Week: Not on file  . Minutes of Exercise per Session: Not on file  Stress:   . Feeling of Stress : Not on file  Social Connections:   . Frequency of Communication with Friends and Family: Not on file  . Frequency of Social Gatherings with Friends and Family: Not on file  . Attends Religious Services: Not on file  . Active Member of Clubs or Organizations: Not on file  . Attends Archivist Meetings: Not on file  . Marital Status: Not on file  Intimate Partner Violence:   . Fear of Current or Ex-Partner: Not on file  . Emotionally Abused: Not on file  . Physically Abused: Not on file  . Sexually Abused: Not on file    Review of Systems: ROS  negative except for what is noted on the assessment and plan.  Vitals:   05/23/20 1314  BP: 134/67  Pulse: 74  Temp: (!) 97.5 F (36.4 C)  TempSrc: Oral  SpO2: 96%  Weight: 266 lb 3.2 oz (120.7 kg)  Height: 5\' 7"  (1.702 m)     Physical Exam: Physical Exam Constitutional:      Appearance: Normal appearance.  HENT:     Head: Normocephalic and atraumatic.  Neck:     Vascular: JVD (4-5 cm above sternal angle) present.  Cardiovascular:     Rate and Rhythm: Normal rate.     Pulses: Normal pulses.     Heart sounds: Normal heart sounds. No murmur heard.  No gallop.   Pulmonary:     Effort: Pulmonary effort is normal.     Breath sounds: Wheezing (faint expiratory wheezing bilateral mid to upper lung fields) present. No rales.  Abdominal:     General: There is no distension.     Palpations: Abdomen is soft.  Musculoskeletal:        General: Tenderness (TTP of LLE) present. Normal range of motion.     Cervical back: Normal range of motion.     Right lower leg: Edema (1-2+ pitting) present.     Left lower leg: Edema (2-3+ pitting) present.  Skin:    General: Skin is warm and dry.  Neurological:     General: No focal deficit present.     Mental Status: She is alert and oriented to person, place, and time.      Assessment & Plan:   See Encounters Tab for problem based charting.  Patient discussed with Dr. Bertell Maria, D.O. [facility], PGY-2 Pager: [phone], Phone: [phone] Date 05/24/2020 Time 10:14 AM

## 2020-05-23 NOTE — Addendum Note (Signed)
Addended by: Gilles Chiquito B on: 05/23/2020 03:06 PM   Modules accepted: Orders

## 2020-05-24 ENCOUNTER — Encounter: Payer: Self-pay | Admitting: Internal Medicine

## 2020-05-24 LAB — BMP8+ANION GAP
Anion Gap: 19 mmol/L — ABNORMAL HIGH (ref 10.0–18.0)
BUN/Creatinine Ratio: 16 (ref 12–28)
BUN: 23 mg/dL (ref 8–27)
CO2: 21 mmol/L (ref 20–29)
Calcium: 9.7 mg/dL (ref 8.7–10.3)
Chloride: 104 mmol/L (ref 96–106)
Creatinine, Ser: 1.41 mg/dL — ABNORMAL HIGH (ref 0.57–1.00)
GFR calc Af Amer: 41 mL/min/{1.73_m2} — ABNORMAL LOW (ref >59)
GFR calc non Af Amer: 36 mL/min/{1.73_m2} — ABNORMAL LOW (ref >59)
Glucose: 68 mg/dL (ref 65–99)
Potassium: 4.5 mmol/L (ref 3.5–5.2)
Sodium: 144 mmol/L (ref 134–144)

## 2020-05-24 LAB — MAGNESIUM: Magnesium: 2.3 mg/dL (ref 1.6–2.3)

## 2020-05-24 NOTE — Assessment & Plan Note (Addendum)
Patient presents for evaluation and management of lower extremity swelling of her left leg.  Patient states that she has a 1 week history of left lower extremity swelling with pain to palpation throughout her lower leg.  Patient states that she denies any repetitive motions or trauma to the leg.  Patient states that she is not employed and lives a primarily sedentary lifestyle.  She denies ever having a DVT in the past.  She denies chest pain or worsening shortness of breath with exertion.   Patient does have a history of diastolic heart failure with her most recent echo on 08/21/2019 showing EF of 55 to 60%, mildly increased left ventricular hypertrophy, moderate aortic stenosis right ventricular enlargement, left atrial moderately dilated right atria mildly dilated and moderately elevated right ventricular systolic pressure.  Currently takes furosemide 40 mg daily for medication management.   She does not have any recent admissions for heart failure but does have a history of atrial fibrillation with normal pulse on evaluation today.  Her weight is 266 pounds which is elevated from her last known dry weight of 248 pounds on 11/24/2019.  Patient sleeps in a hospital bed at home and does not have good insight into shortness of breath when laying flat.  On evaluation patient has bilateral lower extremity edema.  Right leg has 1-2+ pitting edema to her knees and her left leg has 2-3+ pitting edema to her knees.  Significant pain to palpation of the left lower extremity.  There is no associated redness or warmth of the left lower extremity.  Patient does not have any abdominal fullness or bloating.  JVD is present at 5-6 cm above the sternal angle. She has bilateral faint mid to upper respiratory wheezing with expiration.  Plan: 1.  I will put in a BMP to assess kidney function electrolytes, magnesium, and BNP 2.  I will increase her furosemide to 80 mg daily (i.e. 40 mg twice daily) for 5 days 3.  I will  get a left lower extremity Doppler ultrasound to rule out DVT 4.  I will reevaluate her on Friday for improved symptoms.

## 2020-05-25 ENCOUNTER — Telehealth: Payer: Self-pay | Admitting: Internal Medicine

## 2020-05-25 ENCOUNTER — Telehealth: Payer: PPO

## 2020-05-25 ENCOUNTER — Telehealth: Payer: Self-pay | Admitting: *Deleted

## 2020-05-25 ENCOUNTER — Telehealth: Payer: Self-pay

## 2020-05-25 DIAGNOSIS — R4182 Altered mental status, unspecified: Secondary | ICD-10-CM | POA: Diagnosis not present

## 2020-05-25 DIAGNOSIS — I5033 Acute on chronic diastolic (congestive) heart failure: Secondary | ICD-10-CM | POA: Diagnosis not present

## 2020-05-25 DIAGNOSIS — R531 Weakness: Secondary | ICD-10-CM | POA: Diagnosis not present

## 2020-05-25 NOTE — Telephone Encounter (Signed)
With lab results from most recent clinic appointment and to assess how she is tolerating her increased dose of Lasix.  I left a HIPAA compliant voicemail to call us back.  Be glad to speak with her at that time regarding her results.  Marianna Payment, D.O. [facility], PGY-2 Pager: [phone], Phone: [phone] Date 05/25/2020 Time 10:41 AM

## 2020-05-25 NOTE — Telephone Encounter (Signed)
Pt missed your call, pls contact pt back regarding labs [phone]

## 2020-05-25 NOTE — Telephone Encounter (Signed)
°  Chronic Care Management   Outreach Note  05/25/2020 Name: Erika [name] MRN: [id number] DOB: [birth year]  Referred by: [name], MD Reason for referral : Chronic Care Management (HF, HTN, AFib, Asthma, myeloproliferative neoplasm)   Third unsuccessful telephone outreach was attempted today. The patient was referred to the case management team for assistance with care management and care coordination. .   Follow Up Plan: Chart review indicates patient was seen in the clinic on 05/23/20 for c/o of lower extremity swelling, left worse than right  and pain in left leg. Left message for patient advising that Dr Marianna Payment left a message for her earlier to day to discuss her lab work and to assess her tolerance of increased Lasix dosage and requesting that she return his call. Advised patient this CCM RN will call her again next week and provided her with this CCM RN's direct dial number should she wish to speak with this CCM RN before next week.     Kelli Churn RN, CCM, [facility] RN Care Manager [phone]

## 2020-05-26 DIAGNOSIS — I509 Heart failure, unspecified: Secondary | ICD-10-CM | POA: Diagnosis not present

## 2020-05-26 NOTE — Progress Notes (Signed)
Internal Medicine Clinic Attending  Case discussed with Dr. Coe  At the time of the visit.  We reviewed the resident's history and exam and pertinent patient test results.  I agree with the assessment, diagnosis, and plan of care documented in the resident's note.  

## 2020-05-27 ENCOUNTER — Ambulatory Visit (HOSPITAL_COMMUNITY)
Admission: RE | Admit: 2020-05-27 | Discharge: 2020-05-27 | Disposition: A | Discharge status: Home / Self Care | Payer: PPO | Source: Ambulatory Visit | Attending: Internal Medicine | Admitting: Internal Medicine

## 2020-05-27 ENCOUNTER — Ambulatory Visit (INDEPENDENT_AMBULATORY_CARE_PROVIDER_SITE_OTHER): Payer: PPO | Admitting: Internal Medicine

## 2020-05-27 ENCOUNTER — Other Ambulatory Visit: Payer: Self-pay

## 2020-05-27 ENCOUNTER — Encounter: Payer: Self-pay | Admitting: Internal Medicine

## 2020-05-27 VITALS — BP 144/60 | HR 72 | Temp 98.0°F | Ht 67.0 in | Wt 265.0 lb

## 2020-05-27 DIAGNOSIS — M7989 Other specified soft tissue disorders: Secondary | ICD-10-CM | POA: Insufficient documentation

## 2020-05-27 DIAGNOSIS — R0602 Shortness of breath: Secondary | ICD-10-CM | POA: Diagnosis not present

## 2020-05-27 DIAGNOSIS — I5032 Chronic diastolic (congestive) heart failure: Secondary | ICD-10-CM | POA: Diagnosis not present

## 2020-05-27 DIAGNOSIS — I110 Hypertensive heart disease with heart failure: Secondary | ICD-10-CM

## 2020-05-27 DIAGNOSIS — J811 Chronic pulmonary edema: Secondary | ICD-10-CM | POA: Diagnosis not present

## 2020-05-27 DIAGNOSIS — I517 Cardiomegaly: Secondary | ICD-10-CM | POA: Diagnosis not present

## 2020-05-27 LAB — BASIC METABOLIC PANEL
Anion gap: 10 (ref 5–15)
BUN: 22 mg/dL (ref 8–23)
CO2: 25 mmol/L (ref 22–32)
Calcium: 9.4 mg/dL (ref 8.9–10.3)
Chloride: 101 mmol/L (ref 98–111)
Creatinine, Ser: 1.4 mg/dL — ABNORMAL HIGH (ref 0.44–1.00)
GFR, Estimated: 39 mL/min — ABNORMAL LOW (ref >60)
Glucose, Bld: 64 mg/dL — ABNORMAL LOW (ref 70–99)
Potassium: 4.2 mmol/L (ref 3.5–5.1)
Sodium: 136 mmol/L (ref 135–145)

## 2020-05-27 LAB — MAGNESIUM: Magnesium: 2 mg/dL (ref 1.7–2.4)

## 2020-05-27 NOTE — Progress Notes (Signed)
Left lower extremity venous duplex completed. Refer to "CV Proc" under chart review to view preliminary results.  05/27/2020 9:07 AM Kelby Aline., MHA, RVT, RDCS, RDMS

## 2020-05-27 NOTE — Progress Notes (Signed)
Patient called.  Patient aware.  

## 2020-05-27 NOTE — Patient Instructions (Signed)
Thank you, Ms.Chauncey Cruel Ratledge for allowing [place] to provide your care today. Today we discussed heart failure.    I have ordered the following labs for you:   Lab Orders     Magnesium     Basic metabolic panel   Tests ordered today:  Chest X-ray  Referrals ordered today:   [facility] at home program  I have ordered the following medication/changed the following medications:   Stop the following medications: There are no discontinued medications.   Start the following medications: No orders of the defined types were placed in this encounter.    Follow up: 1 week    Remember: if your kidney function worsens then you will need to be admitted to the hospital.  Should you have any questions or concerns please call the internal medicine clinic at [phone].     Marianna Payment, D.O. [place]

## 2020-05-27 NOTE — Progress Notes (Signed)
CC: Heart failure  HPI:  [name] is a 79 y.o. female with a past medical history stated below and presents today for heart failure. Please see problem based assessment and plan for additional details.  Past Medical History:  Diagnosis Date  . Anemia    with menses  . Asthma   . Atrial flutter, paroxysmal ([place])   . Back pain    status post surgery 2002  . Breast cyst    Excesion with FNA, begnin in 2004.   . Degenerative joint disease of spine    Imaging 2005,  Degenerative hypertrophic facet arthritis changes L4-5 and L5-S1..   . History of shingles    Recurrent with post herpetic neuralgia.   [name] Hypertension   . Lymphadenopathy    Of the mediastinum, Right side CXR 2008, not read on 2010 cxr.   . Menopause   . Obesity    BMI 54  . Psychosis ([place])    Secondary to prednisone.  . Shingles   . Stasis dermatitis    W/ LE edema, prviously on lasix now on mazxide.   . SVT (supraventricular tachycardia) [facility]) June 2009   one run while hospitalized  . Tuberculosis    active TB treated in 2002, hx of paraspinal lumbar TB,     Current Outpatient Medications on File Prior to Visit  Medication Sig Dispense Refill  . ADVAIR DISKUS 500-50 MCG/DOSE AEPB Inhale 1 puff into the lungs 2 (two) times daily. 180 each 3  . albuterol (VENTOLIN HFA) 108 (90 Base) MCG/ACT inhaler Inhale 1-2 puffs into the lungs every 6 (six) hours as needed for wheezing or shortness of breath. 18 g 11  . allopurinol (ZYLOPRIM) 300 MG tablet Take 0.5 tablets (150 mg total) by mouth 2 (two) times daily. 90 tablet 3  . amLODipine (NORVASC) 10 MG tablet Take 0.5 tablets (5 mg total) by mouth daily. 90 tablet 3  . apixaban (ELIQUIS) 5 MG TABS tablet Take 1 tablet (5 mg total) by mouth 2 (two) times daily. 60 tablet 11  . aspirin EC 81 MG tablet Take 1 tablet (81 mg total) by mouth daily. 90 tablet 3  . Ensure Max Protein (ENSURE MAX PROTEIN) LIQD Take 330 mLs (11 oz total) by mouth 2 (two) times daily.  (Patient taking differently: Take 11 oz by mouth 2 (two) times daily. She drinks regular Ensure not Ensure Max and she prefers chocolate)    . fluticasone (FLONASE) 50 MCG/ACT nasal spray SHAKE LIQUID AND INSTILL ONE (1) SPRAY IN EACH NOSTRIL ONCE DAILY 16 g 4  . furosemide (LASIX) 40 MG tablet Take 1 tablet (40 mg total) by mouth 2 (two) times daily for 5 days. 10 tablet 0  . metoprolol tartrate (LOPRESSOR) 50 MG tablet Take 1 tablet (50 mg total) by mouth 2 (two) times daily. 60 tablet 11  . NYAMYC powder APPLY TOPICALLY TWICE DAILY 15 g 3  . omeprazole (PRILOSEC) 20 MG capsule Take 1 capsule (20 mg total) by mouth daily. 90 capsule 3  . potassium chloride (KLOR-CON) 10 MEQ tablet Take 2 tablets (20 mEq total) by mouth daily for 5 days. 10 tablet 0  . pravastatin (PRAVACHOL) 20 MG tablet Take 1 tablet (20 mg total) by mouth daily. 90 tablet 3  . sodium chloride (OCEAN) 0.65 % SOLN nasal spray Place 1 spray into both nostrils as needed for congestion. 88 mL 0   No current facility-administered medications on file prior to visit.    Family  History  Problem Relation Age of Onset  . Stroke Mother        at young age  . Heart disease Mother   . Heart attack Father   . Heart disease Father   . Cancer Sister        Unknown  . Stroke Brother   . Stroke Brother   . Breast cancer Other   . Sickle cell anemia Brother   . Diabetes Brother   . Stroke Brother     Social History   Socioeconomic History  . Marital status: Widowed    Spouse name: Not on file  . Number of children: Not on file  . Years of education: Not on file  . Highest education level: Not on file  Occupational History  . 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 Smoking status: Never Smoker  . Smokeless tobacco: Never Used  Vaping Use  . Vaping Use: Never used  Substance and Sexual Activity  . Alcohol use: No    Alcohol/week: 0.0 standard drinks  .  Drug use: No  . Sexual activity: Not Currently    Birth control/protection: Post-menopausal  Other Topics Concern  . Not on file  Social History Narrative   Current Social History 03/20/2019        Patient lives alone in a ground floor apartment which is 1 story. There are not steps up to the entrance the patient uses. One son stays with her once in a while.      Patient's method of transportation is personal car.      The highest level of education was some high school: 11th grade.      The patient currently retired.      Identified important Relationships are "My friend Mart Piggs, my nieces, my grandkids, a lot of people.       Pets : None       Interests / Fun: "Playing word games on my phone, watch TV, Talking on phone."       Current Stressors: "Nothing, really. I try not to let anything stress me."       Religious / Personal Beliefs: "I believe in God."       L. Ducatte, RN, BSN    Social Determinants of Health   Financial Resource Strain: Low Risk   . Difficulty of Paying Living Expenses: Not very hard  Food Insecurity: No Food Insecurity  . Worried About Charity fundraiser in the Last Year: Never true  . Ran Out of Food in the Last Year: Never true  Transportation Needs: Unmet Transportation Needs  . Lack of Transportation (Medical): Yes  . Lack of Transportation (Non-Medical): No  Physical Activity:   . Days of Exercise per Week: Not on file  . Minutes of Exercise per Session: Not on file  Stress:   . Feeling of Stress : Not on file  Social Connections:   . Frequency of Communication with Friends and Family: Not on file  . Frequency of Social Gatherings with Friends and Family: Not on file  . Attends Religious Services: Not on file  . Active Member of Clubs or Organizations: Not on file  . Attends Archivist Meetings: Not on file  . Marital Status: Not on file  Intimate Partner Violence:   . Fear of Current or Ex-Partner: Not on file  .  Emotionally Abused: Not on file  . Physically Abused:  Not on file  . Sexually Abused: Not on file    Review of Systems: ROS negative except for what is noted on the assessment and plan.  Vitals:   05/27/20 0909 05/27/20 0911  BP: (!) 144/60   Pulse: 72   Temp: 98 F (36.7 C)   TempSrc: Oral   SpO2: 93% 97%  Weight: 265 lb (120.2 kg)   Height: 5\' 7"  (1.702 m)      Physical Exam: Physical Exam Constitutional:      Appearance: Normal appearance. She is obese.  HENT:     Head: Normocephalic and atraumatic.  Neck:     Vascular: JVD present.  Cardiovascular:     Rate and Rhythm: Normal rate.     Pulses: Normal pulses.     Heart sounds: Murmur (systolic) heard.   Pulmonary:     Effort: Pulmonary effort is normal.     Breath sounds: Decreased breath sounds present.  Abdominal:     General: There is no distension.     Palpations: Abdomen is soft.  Musculoskeletal:        General: Swelling (3+ pitting edema L>R) present.     Cervical back: Normal range of motion.  Skin:    General: Skin is warm and dry.  Neurological:     General: No focal deficit present.     Mental Status: She is alert. Mental status is at baseline.  Psychiatric:        Mood and Affect: Mood normal.      Assessment & Plan:   See Encounters Tab for problem based charting.  Patient discussed with Dr. Lars Mage, D.O. [facility], PGY-2 Pager: [phone], Phone: [phone] Date 05/28/2020 Time 7:02 AM

## 2020-05-28 ENCOUNTER — Encounter: Payer: Self-pay | Admitting: Internal Medicine

## 2020-05-28 DIAGNOSIS — I509 Heart failure, unspecified: Secondary | ICD-10-CM | POA: Diagnosis not present

## 2020-05-28 NOTE — Assessment & Plan Note (Addendum)
Patient presents for revaluation for acute on chronic diastolic CHF. Patient was recently prescribed lasix 40 mg BID for 5 days in an effort to manage her hypervolemia in the out patient setting. Patient has only had 1 lb weight loss since her previous appointment and therefore, has failed outpatient management. She continues to have significant lower extremity pitting edema, JVD, orthopnea, and decreased exeresis tolerance. Furthermore, her kidney function has worsened from a baseline of 1.2 to 1.4 during this time. Patient would qualify for admission for further evaluation and management of her acute decompensated heart failure. Therefore, I will consult heart failure a home program to administer IV diuretics starting tomorrow. Patient will need another Echo this year. Will need to set this up at her follow up appointment.    Plan: 1. Referral to heart failure at home  2. Start furosemide 80 mg IV daily 3. Check BMP and Mg every day that she is receiving IV diuretics  4. Replace potassium as needed with 40 mEq of KCl daily.  5. Notify PCP if patient does not achieve good urine output at home or needs a higher dose of furosemide.  6. CXR today 7. Follow up in 1 week

## 2020-05-29 DIAGNOSIS — I509 Heart failure, unspecified: Secondary | ICD-10-CM | POA: Diagnosis not present

## 2020-05-29 NOTE — Progress Notes (Signed)
HEMATOLOGY/ONCOLOGY CLINIC NOTE  Date of Service: 06/01/20    Inpatient Attending: .Erika Genera, MD  CC:  F/u MPN NOS chek2 mutation +ve  HPI  Patient is a very pleasant 79 year old female with a history of morbid obesity, anemia, asthma, hypertension, lumbar spinal TB in 2002 who presented with acute onset of lower back pain on Monday. She reports that she drank some pepsi and tea on that day and her back pain began shortly after. No initial injury to precipitate her back pain. It gradually worsened until she was seen in the ED on 05/30/17. Labs during admission revealed WBC count of 55.2k with predominantly neutrophilia of 48.8k and some monocytosis at 2.2k.   She had minimal anemia at 11.5 with an MCV of 92 and a normal platelet count of 163k.  At that time she had CT A/P while in the ED which was normal and without splenomegaly. Additionally, she also had MRI of the L-spine which only showed degenerative changes. She was subsequently admitted for further workup and pain control and to rule out a source of infection.   Of note, the patient has a remote prior h/o epidural mass in 2002 (hemilaminectomy and decompression of tumor in epidural space 2002) which was thought to be secondary to lumbar epidural tuberculosis. She was placed on a course of antibiotics for several months following her diagnosis.   Following her admission, we were consulted for further workup. No recent new medications. She reports that her inhaler does contain steroids, but otherwise she has not been on a course of steroids recently. She is UTD with her regular cancer screenings. She does not feel any different now than she did 16moto a year ago.  No focal symptoms suggestive of infection other than the back pain at this time.  On ROS, she denies fever, chills, night sweats, unexpected weight loss, abdominal pain, sore throat, joint swelling, or any other associated symptoms. Her back pain is much improved  with muscle relaxants. She does note some right great toe pain, but otherwise she has no other acute symptoms.   INTERVAL HISTORY:   Erika MCILVAINis here for follow up of persistent leucocytosis/neutrophilia consistent with chronic MPN NOS with Chek2 mutation. The patient's last visit with [facility] on 03/01/2020. The pt reports that she is doing well overall.  The pt reports that she has been more short of breath and had developed bilateral leg swelling left more than right. She has been started on Lasix and notes that she has lost 6 pounds in the last week with improvement in her breathing. She notes her left leg still swollen and she had an ultrasound of the left lower extremity which showed no evidence of DVT. She is already on aspirin and Eliquis for cardiac issues.  Labs today showed significant anemia with a drop in hemoglobin to 6.5. Discussed with patient and she provided verbal informed consent for PRBC transfusion for symptomatic anemia. We also discussed that getting additional work-up to look at other etiologies of her anemia including possibility of plasma cell neoplasm and to rule out other etiologies other than her MPN/MDS.  MEDICAL HISTORY:  Past Medical History:  Diagnosis Date  . Anemia    with menses  . Asthma   . [place]   . Back pain    status post surgery 2002  . Breast cyst    Excesion with FNA, begnin in 2004.   . Degenerative joint disease of spine  Imaging 2005,  Degenerative hypertrophic facet arthritis changes L4-5 and L5-S1..   . History of shingles    Recurrent with post herpetic neuralgia.   [name] Hypertension   . Lymphadenopathy    Of the mediastinum, Right side CXR 2008, not read on 2010 cxr.   . Menopause   . Obesity    BMI 54  . Psychosis ([place])    Secondary to prednisone.  . Shingles   . Stasis dermatitis    W/ LE edema, prviously on lasix now on mazxide.   . SVT (supraventricular tachycardia) [facility]) June 2009   one  run while hospitalized  . Tuberculosis    active TB treated in 2002, hx of paraspinal lumbar TB,     SURGICAL HISTORY: Past Surgical History:  Procedure Laterality Date  . BREAST CYST ASPIRATION Left   . Breast cyst biopsy    . CHOLECYSTECTOMY  02/02/2012   Procedure: LAPAROSCOPIC CHOLECYSTECTOMY;  Surgeon: Zenovia Jarred, MD;  Location: [place];  Service: General;  Laterality: N/A;  . Hemilaminectomy of L4, L5 and S1 decompression of tumor in epidural space  2000  . IR FLUORO GUIDED NEEDLE PLC ASPIRATION/INJECTION LOC  07/17/2019    SOCIAL HISTORY: Social History   Socioeconomic History  . Marital status: Widowed    Spouse name: Not on file  . Number of children: Not on file  . Years of education: Not on file  . Highest education level: Not on file  Occupational History  . 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 Smoking status: Never Smoker  . Smokeless tobacco: Never Used  Vaping Use  . Vaping Use: Never used  Substance and Sexual Activity  . Alcohol use: No    Alcohol/week: 0.0 standard drinks  . Drug use: No  . Sexual activity: Not Currently    Birth control/protection: Post-menopausal  Other Topics Concern  . Not on file  Social History Narrative   Current Social History 03/20/2019        Patient lives alone in a ground floor apartment which is 1 story. There are not steps up to the entrance the patient uses. One son stays with her once in a while.      Patient's method of transportation is personal car.      The highest level of education was some high school: 11th grade.      The patient currently retired.      Identified important Relationships are "My friend Mart Piggs, my nieces, my grandkids, a lot of people.       Pets : None       Interests / Fun: "Playing word games on my phone, watch TV, Talking on phone."       Current Stressors: "Nothing, really. I try not to let anything stress  me."       Religious / Personal Beliefs: "I believe in God."       L. Ducatte, RN, BSN    Social Determinants of Health   Financial Resource Strain: Low Risk   . Difficulty of Paying Living Expenses: Not very hard  Food Insecurity: No Food Insecurity  . Worried About Charity fundraiser in the Last Year: Never true  . Ran Out of Food in the Last Year: Never true  Transportation Needs: Unmet Transportation Needs  . Lack of Transportation (Medical): Yes  . Lack of Transportation (Non-Medical): No  Physical Activity:   . Days  of Exercise per Week: Not on file  . Minutes of Exercise per Session: Not on file  Stress:   . Feeling of Stress : Not on file  Social Connections:   . Frequency of Communication with Friends and Family: Not on file  . Frequency of Social Gatherings with Friends and Family: Not on file  . Attends Religious Services: Not on file  . Active Member of Clubs or Organizations: Not on file  . Attends Archivist Meetings: Not on file  . Marital Status: Not on file  Intimate Partner Violence:   . Fear of Current or Ex-Partner: Not on file  . Emotionally Abused: Not on file  . Physically Abused: Not on file  . Sexually Abused: Not on file    FAMILY HISTORY: Family History  Problem Relation Age of Onset  . Stroke Mother        at young age  . Heart disease Mother   . Heart attack Father   . Heart disease Father   . Cancer Sister        Unknown  . Stroke Brother   . Stroke Brother   . Breast cancer Other   . Sickle cell anemia Brother   . Diabetes Brother   . Stroke Brother     ALLERGIES:  is allergic to prednisone and seroquel [quetiapine fumarate].  MEDICATIONS:  . Current Outpatient Medications:  .  ADVAIR DISKUS 500-50 MCG/DOSE AEPB, Inhale 1 puff into the lungs 2 (two) times daily., Disp: 180 each, Rfl: 3 .  albuterol (VENTOLIN HFA) 108 (90 Base) MCG/ACT inhaler, Inhale 1-2 puffs into the lungs every 6 (six) hours as needed for wheezing  or shortness of breath., Disp: 18 g, Rfl: 11 .  allopurinol (ZYLOPRIM) 300 MG tablet, Take 0.5 tablets (150 mg total) by mouth 2 (two) times daily., Disp: 90 tablet, Rfl: 3 .  amLODipine (NORVASC) 10 MG tablet, Take 0.5 tablets (5 mg total) by mouth daily., Disp: 90 tablet, Rfl: 3 .  apixaban (ELIQUIS) 5 MG TABS tablet, Take 1 tablet (5 mg total) by mouth 2 (two) times daily., Disp: 60 tablet, Rfl: 11 .  aspirin EC 81 MG tablet, Take 1 tablet (81 mg total) by mouth daily., Disp: 90 tablet, Rfl: 3 .  Ensure Max Protein (ENSURE MAX PROTEIN) LIQD, Take 330 mLs (11 oz total) by mouth 2 (two) times daily. (Patient taking differently: Take 11 oz by mouth 2 (two) times daily. She drinks regular Ensure not Ensure Max and she prefers chocolate), Disp:  , Rfl:  .  fluticasone (FLONASE) 50 MCG/ACT nasal spray, SHAKE LIQUID AND INSTILL ONE (1) SPRAY IN EACH NOSTRIL ONCE DAILY, Disp: 16 g, Rfl: 4 .  furosemide (LASIX) 40 MG tablet, Take 1 tablet (40 mg total) by mouth 2 (two) times daily for 5 days., Disp: 10 tablet, Rfl: 0 .  metoprolol tartrate (LOPRESSOR) 50 MG tablet, Take 1 tablet (50 mg total) by mouth 2 (two) times daily., Disp: 60 tablet, Rfl: 11 .  NYAMYC powder, APPLY TOPICALLY TWICE DAILY, Disp: 15 g, Rfl: 3 .  omeprazole (PRILOSEC) 20 MG capsule, Take 1 capsule (20 mg total) by mouth daily., Disp: 90 capsule, Rfl: 3 .  potassium chloride (KLOR-CON) 10 MEQ tablet, Take 2 tablets (20 mEq total) by mouth daily for 5 days., Disp: 10 tablet, Rfl: 0 .  pravastatin (PRAVACHOL) 20 MG tablet, Take 1 tablet (20 mg total) by mouth daily., Disp: 90 tablet, Rfl: 3 .  sodium chloride (OCEAN) 0.65 %  SOLN nasal spray, Place 1 spray into both nostrils as needed for congestion., Disp: 88 mL, Rfl: 0   REVIEW OF SYSTEMS:   A 10+ POINT REVIEW OF SYSTEMS WAS OBTAINED including neurology, dermatology, psychiatry, cardiac, respiratory, lymph, extremities, GI, GU, Musculoskeletal, constitutional, breasts, reproductive,  HEENT.  All pertinent positives are noted in the HPI.  All others are negative.   PHYSICAL EXAMINATION:  There were no vitals filed for this visit..  nad GENERAL:alert, in no acute distress and comfortable SKIN: no acute rashes, no significant lesions EYES: conjunctiva are pink and non-injected, sclera anicteric OROPHARYNX: MMM, no exudates, no oropharyngeal erythema or ulceration NECK: supple, no JVD LYMPH:  no palpable lymphadenopathy in the cervical, axillary or inguinal regions LUNGS: clear to auscultation b/l with normal respiratory effort HEART: regular rate & rhythm ABDOMEN:  normoactive bowel sounds , non tender, not distended. No palpable hepatosplenomegaly.  Extremity: Bilateral lower extremity edema left more than right, no redness warmth or tenderness to palpation over the calves. PSYCH: alert & oriented x 3 with fluent speech NEURO: no focal motor/sensory deficits  LABORATORY DATA:  I have reviewed the data as listed   .       CBC Latest Ref Rng & Units 03/01/2020 11/30/2019 10/28/2019  WBC 4.0 - 10.5 K/uL 79.6(HH) 84.4(HH) 117.3(HH)  Hemoglobin 12.0 - 15.0 g/dL 7.8(L) 8.4(L) 7.6(L)  Hematocrit 36 - 46 % 25.1(L) 27.3(L) 24.3(L)  Platelets 150 - 400 K/uL 170 151 365   . CBC    Component Value Date/Time   WBC 79.6 (HH) 03/01/2020 0934   RBC 2.69 (L) 03/01/2020 0934   HGB 7.8 (L) 03/01/2020 0934   HGB 11.4 (L) 03/06/2018 0910   HGB 11.3 (L) 07/24/2017 1402   HCT 25.1 (L) 03/01/2020 0934   HCT 27.7 (L) 07/12/2019 0129   HCT 36.0 07/24/2017 1402   PLT 170 03/01/2020 0934   PLT 166 03/06/2018 0910   PLT 172 07/24/2017 1402   MCV 93.3 03/01/2020 0934   MCV 93.3 07/24/2017 1402   MCH 29.0 03/01/2020 0934   MCHC 31.1 03/01/2020 0934   RDW 16.8 (H) 03/01/2020 0934   RDW 15.6 (H) 07/24/2017 1402   LYMPHSABS 3.1 03/01/2020 0934   LYMPHSABS 2.9 07/24/2017 1402   MONOABS 2.1 (H) 03/01/2020 0934   MONOABS 1.8 (H) 07/24/2017 1402   EOSABS 0.6 (H) 03/01/2020  0934   EOSABS 1.1 (H) 07/24/2017 1402   BASOSABS 0.1 03/01/2020 0934   BASOSABS 0.2 (H) 07/24/2017 1402   . CMP Latest Ref Rng & Units 05/27/2020 05/23/2020 03/01/2020  Glucose 70 - 99 mg/dL 64(L) 68 84  BUN 8 - 23 mg/dL 22 23 24(H)  Creatinine 0.44 - 1.00 mg/dL 1.40(H) 1.41(H) 1.30(H)  Sodium 135 - 145 mmol/L 136 144 139  Potassium 3.5 - 5.1 mmol/L 4.2 4.5 4.4  Chloride 98 - 111 mmol/L 101 104 103  CO2 22 - 32 mmol/L _0 Calcium 8.9 - 10.3 mg/dL 9.4 9.7 9.9  Total Protein 6.5 - 8.1 g/dL - - 8.0  Total Bilirubin 0.3 - 1.2 mg/dL - - 0.3  Alkaline Phos 38 - 126 U/L - - 349(H)  AST 15 - 41 U/L - - 17  ALT 0 - 44 U/L - - 11    RADIOGRAPHIC STUDIES: I have personally reviewed the radiological images as listed and agreed with the findings in the report. DG Chest 2 View  Result Date: 05/28/2020 CLINICAL DATA:  Shortness of breath. Bilateral leg swelling for the past  2 weeks. EXAM: CHEST - 2 VIEW COMPARISON:  08/21/2019 FINDINGS: Enlarged cardiac silhouette with an interval increase in size. No significant change in diffuse prominence of the pulmonary vasculature and interstitial markings. No pleural fluid. Diffuse osteopenia. The central pulmonary arteries remain enlarged. Cholecystectomy clips. IMPRESSION: Interval cardiomegaly and stable chronic interstitial lung disease, mild pulmonary vascular congestion and enlarged central pulmonary arteries, compatible with pulmonary arterial hypertension. Electronically Signed   By: Claudie Revering M.D.   On: 05/28/2020 20:02   VAS [place] LOWER EXTREMITY VENOUS (DVT)  Result Date: 05/27/2020  Lower Venous DVTStudy Indications: Swelling.  Limitations: Body habitus and poor ultrasound/tissue interface. Comparison Study: No prior study Performing Technologist: Maudry Mayhew MHA, RDMS, RVT, RDCS  Examination Guidelines: A complete evaluation includes B-mode imaging, spectral Doppler, color Doppler, and power Doppler as needed of all accessible  portions of each vessel. Bilateral testing is considered an integral part of a complete examination. Limited examinations for reoccurring indications may be performed as noted. The reflux portion of the exam is performed with the patient in reverse Trendelenburg.  +-----+---------------+---------+-----------+----------+--------------+ RIGHTCompressibilityPhasicitySpontaneityPropertiesThrombus Aging +-----+---------------+---------+-----------+----------+--------------+ CFV  Full           Yes      Yes                                 +-----+---------------+---------+-----------+----------+--------------+   +---------+---------------+---------+-----------+----------+--------------+ LEFT     CompressibilityPhasicitySpontaneityPropertiesThrombus Aging +---------+---------------+---------+-----------+----------+--------------+ CFV      Full           No       Yes                                 +---------+---------------+---------+-----------+----------+--------------+ SFJ      Full                                                        +---------+---------------+---------+-----------+----------+--------------+ FV Prox  Full                                                        +---------+---------------+---------+-----------+----------+--------------+ FV Mid   Full                                                        +---------+---------------+---------+-----------+----------+--------------+ FV DistalFull                                                        +---------+---------------+---------+-----------+----------+--------------+ PFV      Full                                                        +---------+---------------+---------+-----------+----------+--------------+  POP      Full           No       Yes                                 +---------+---------------+---------+-----------+----------+--------------+ PTV      Full                                                         +---------+---------------+---------+-----------+----------+--------------+   Left Technical Findings: Not visualized segments include peroneal veins.   Summary: RIGHT: - No evidence of common femoral vein obstruction.  LEFT: - There is no evidence of deep vein thrombosis in the lower extremity. However, portions of this examination were limited- see technologist comments above.  - No cystic structure found in the popliteal fossa.  *See table(s) above for measurements and observations. Electronically signed by Servando Snare MD on 05/27/2020 at 1:10:35 PM.    Final     ASSESSMENT & PLAN:   DIASIA HENKEN is a delightful, well-appearing 79 y.o. woman with   #1 Leukocytosis?predominantly Neutrophilia with some monocytosis CMML vs MPN NOS with chek 2 mutation Patient initially presented with a WBC count of 55.2k with 48.8k neutrophils.  WBC counts relatively stable today.  Peripheral blood smear personally previously reviewed-shows significant neutrophilia with left shift.  Increased bands with a few myelocytes and metamyelocytes.  Pelgeroid neutrophils.  No significant increased blasts.  Monocytosis present.  CT abdomen done for workup of her back pain did not show any acute intra-abdominal or pelvic findings. He does not have any overt splenomegaly on her CT abdomen -which is somewhat argues against a chronic myeloproliferative disorder though does not rule this out.  MRI of the lumbar spine shows old surgical and degenerative changes but no evidence of tumor or infectious collections.  Leucocytosis - improved at 34.3k today. No fevers/chills or other constitutional symptoms  Foundation One heme with Chek 2 mutation. No other findings noted.  CT chest on 05/31/2017: IMPRESSION: 1. Mild ground-glass opacities suggest mild pulmonary edema. Small bilateral pleural effusions and basilar atelectasis. No pulmonary infiltrate. Mild subcarinal mediastinal  adenopathy is likely reactive. Cardiomegaly.  JAK2 and BCR-ABL mutations neg. LDH levels were within normal limits and did not suggest a high-grade lymphoma.  06/24/17 BM bx- this showed hypercellular bone marrow with granulocytic proliferation. Cytogenetic analysis was normal.   #2 severe normocytic anemia  #3 CHF/moderate aortic stenosis/paroxysmal atrial flutter on chronic anticoagulation.  #4 bilateral lower extremity swelling left more than right. Ultrasound negative for DVT on 10/22. Cannot rule out venous reflux issues.  PLAN:  -Discussed labs today which showed increasing leukocytosis and significant anemia with a hemoglobin of 6.5 with symptoms of shortness of breath dyspnea exertion and some element of fluid overload. -With informed consent requested 2 units of PRBCs for treatment of symptomatic anemia today. Would add IV Lasix to premedications to reduce the chance of fluid overload. -We will send additional labs for anemia work-up including repeat myeloma panel and serum free light chains to rule out progression of her small plasma cell population. -We discussed that if her anemia becomes limiting and there is no other easy fix to this we would have to attributed to her MPN/MDS and discuss treatment  options upon next visit in 1 month.  -Continue follow-up with cardiology for management of her cardiac issues, anticoagulation management. -Continue follow-up with primary care physician for management of her Lasix and further work-up of her lower extremity swelling. Consider ultrasound for venous reflux studies and possible referral to vascular surgery. We shall defer this to her primary care physician.    FOLLOW UP: Additional labs today 2 units of PRBC transfusions today Return to clinic with Dr. Irene Limbo with labs and appointment for 1 unit of PRBCs in 1 month   The total time spent in the appt was 30 minutes and more than 50% was on counseling and direct patient cares.  All of  the patient's questions were answered with apparent satisfaction. The patient knows to call the clinic with any problems, questions or concerns.   Sullivan Lone MD [place] AAHIVMS [facility] Hematology/Oncology Physician [facility]  (Office):       [phone] (Work cell):  [phone] (Fax):           [phone]   I, Yevette Edwards, am acting as a scribe for Dr. Sullivan Lone.   .I have reviewed the above documentation for accuracy and completeness, and I agree with the above. Erika Genera MD

## 2020-05-30 ENCOUNTER — Encounter (HOSPITAL_COMMUNITY): Payer: PPO

## 2020-05-30 ENCOUNTER — Encounter: Payer: Self-pay | Admitting: *Deleted

## 2020-05-30 DIAGNOSIS — I5032 Chronic diastolic (congestive) heart failure: Secondary | ICD-10-CM | POA: Diagnosis not present

## 2020-05-30 DIAGNOSIS — R600 Localized edema: Secondary | ICD-10-CM | POA: Diagnosis not present

## 2020-05-30 DIAGNOSIS — I509 Heart failure, unspecified: Secondary | ICD-10-CM | POA: Diagnosis not present

## 2020-05-30 NOTE — Progress Notes (Signed)

## 2020-05-30 NOTE — Progress Notes (Signed)
Internal Medicine Clinic Attending  Case discussed with Dr. Coe  At the time of the visit.  We reviewed the resident's history and exam and pertinent patient test results.  I agree with the assessment, diagnosis, and plan of care documented in the resident's note.  

## 2020-05-31 ENCOUNTER — Other Ambulatory Visit: Payer: Self-pay | Admitting: *Deleted

## 2020-05-31 DIAGNOSIS — I5043 Acute on chronic combined systolic (congestive) and diastolic (congestive) heart failure: Secondary | ICD-10-CM | POA: Diagnosis not present

## 2020-05-31 DIAGNOSIS — D471 Chronic myeloproliferative disease: Secondary | ICD-10-CM

## 2020-05-31 DIAGNOSIS — D649 Anemia, unspecified: Secondary | ICD-10-CM

## 2020-06-01 ENCOUNTER — Other Ambulatory Visit: Payer: Self-pay | Admitting: *Deleted

## 2020-06-01 ENCOUNTER — Inpatient Hospital Stay: Payer: PPO | Admitting: Hematology

## 2020-06-01 ENCOUNTER — Inpatient Hospital Stay: Payer: PPO | Attending: Hematology

## 2020-06-01 ENCOUNTER — Other Ambulatory Visit: Payer: Self-pay

## 2020-06-01 ENCOUNTER — Inpatient Hospital Stay: Payer: PPO

## 2020-06-01 ENCOUNTER — Telehealth: Payer: PPO

## 2020-06-01 VITALS — BP 125/66 | HR 74 | Temp 96.5°F | Resp 18 | Ht 67.0 in | Wt 262.3 lb

## 2020-06-01 DIAGNOSIS — Z803 Family history of malignant neoplasm of breast: Secondary | ICD-10-CM | POA: Insufficient documentation

## 2020-06-01 DIAGNOSIS — I509 Heart failure, unspecified: Secondary | ICD-10-CM | POA: Diagnosis not present

## 2020-06-01 DIAGNOSIS — R0602 Shortness of breath: Secondary | ICD-10-CM | POA: Diagnosis not present

## 2020-06-01 DIAGNOSIS — Z7901 Long term (current) use of anticoagulants: Secondary | ICD-10-CM | POA: Diagnosis not present

## 2020-06-01 DIAGNOSIS — M7989 Other specified soft tissue disorders: Secondary | ICD-10-CM | POA: Diagnosis not present

## 2020-06-01 DIAGNOSIS — D72829 Elevated white blood cell count, unspecified: Secondary | ICD-10-CM | POA: Insufficient documentation

## 2020-06-01 DIAGNOSIS — D649 Anemia, unspecified: Secondary | ICD-10-CM

## 2020-06-01 DIAGNOSIS — D471 Chronic myeloproliferative disease: Secondary | ICD-10-CM

## 2020-06-01 DIAGNOSIS — Z823 Family history of stroke: Secondary | ICD-10-CM | POA: Diagnosis not present

## 2020-06-01 DIAGNOSIS — D72821 Monocytosis (symptomatic): Secondary | ICD-10-CM | POA: Diagnosis not present

## 2020-06-01 DIAGNOSIS — I4892 Unspecified atrial flutter: Secondary | ICD-10-CM | POA: Insufficient documentation

## 2020-06-01 DIAGNOSIS — Z79899 Other long term (current) drug therapy: Secondary | ICD-10-CM | POA: Insufficient documentation

## 2020-06-01 DIAGNOSIS — Z832 Family history of diseases of the blood and blood-forming organs and certain disorders involving the immune mechanism: Secondary | ICD-10-CM | POA: Diagnosis not present

## 2020-06-01 DIAGNOSIS — I350 Nonrheumatic aortic (valve) stenosis: Secondary | ICD-10-CM | POA: Insufficient documentation

## 2020-06-01 DIAGNOSIS — M549 Dorsalgia, unspecified: Secondary | ICD-10-CM | POA: Insufficient documentation

## 2020-06-01 DIAGNOSIS — Z809 Family history of malignant neoplasm, unspecified: Secondary | ICD-10-CM | POA: Diagnosis not present

## 2020-06-01 DIAGNOSIS — I2721 Secondary pulmonary arterial hypertension: Secondary | ICD-10-CM | POA: Insufficient documentation

## 2020-06-01 DIAGNOSIS — Z8249 Family history of ischemic heart disease and other diseases of the circulatory system: Secondary | ICD-10-CM | POA: Insufficient documentation

## 2020-06-01 DIAGNOSIS — Z833 Family history of diabetes mellitus: Secondary | ICD-10-CM | POA: Insufficient documentation

## 2020-06-01 DIAGNOSIS — J811 Chronic pulmonary edema: Secondary | ICD-10-CM | POA: Diagnosis not present

## 2020-06-01 LAB — CBC WITH DIFFERENTIAL (CANCER CENTER ONLY)
Abs Immature Granulocytes: 6.9 10*3/uL — ABNORMAL HIGH (ref 0.00–0.07)
Basophils Absolute: 0.1 10*3/uL (ref 0.0–0.1)
Basophils Relative: 0 %
Eosinophils Absolute: 0.5 10*3/uL (ref 0.0–0.5)
Eosinophils Relative: 1 %
HCT: 21 % — ABNORMAL LOW (ref 36.0–46.0)
Hemoglobin: 6.5 g/dL — CL (ref 12.0–15.0)
Immature Granulocytes: 7 %
Lymphocytes Relative: 3 %
Lymphs Abs: 3.3 10*3/uL (ref 0.7–4.0)
MCH: 28.4 pg (ref 26.0–34.0)
MCHC: 31 g/dL (ref 30.0–36.0)
MCV: 91.7 fL (ref 80.0–100.0)
Monocytes Absolute: 2.7 10*3/uL — ABNORMAL HIGH (ref 0.1–1.0)
Monocytes Relative: 3 %
Neutro Abs: 87.6 10*3/uL — ABNORMAL HIGH (ref 1.7–7.7)
Neutrophils Relative %: 86 %
Platelet Count: 158 10*3/uL (ref 150–400)
RBC: 2.29 MIL/uL — ABNORMAL LOW (ref 3.87–5.11)
RDW: 19.9 % — ABNORMAL HIGH (ref 11.5–15.5)
WBC Count: 101.2 10*3/uL (ref 4.0–10.5)
nRBC: 0 % (ref 0.0–0.2)

## 2020-06-01 LAB — CMP (CANCER CENTER ONLY)
ALT: 15 U/L (ref 0–44)
AST: 20 U/L (ref 15–41)
Albumin: 3.7 g/dL (ref 3.5–5.0)
Alkaline Phosphatase: 377 U/L — ABNORMAL HIGH (ref 38–126)
Anion gap: 9 (ref 5–15)
BUN: 32 mg/dL — ABNORMAL HIGH (ref 8–23)
CO2: 26 mmol/L (ref 22–32)
Calcium: 9.5 mg/dL (ref 8.9–10.3)
Chloride: 103 mmol/L (ref 98–111)
Creatinine: 1.74 mg/dL — ABNORMAL HIGH (ref 0.44–1.00)
GFR, Estimated: 30 mL/min — ABNORMAL LOW (ref >60)
Glucose, Bld: 91 mg/dL (ref 70–99)
Potassium: 4.3 mmol/L (ref 3.5–5.1)
Sodium: 138 mmol/L (ref 135–145)
Total Bilirubin: 0.4 mg/dL (ref 0.3–1.2)
Total Protein: 8.2 g/dL — ABNORMAL HIGH (ref 6.5–8.1)

## 2020-06-01 LAB — IRON AND TIBC
Iron: 17 ug/dL — ABNORMAL LOW (ref 41–142)
Saturation Ratios: 4 % — ABNORMAL LOW (ref 21–57)
TIBC: 433 ug/dL (ref 236–444)
UIBC: 415 ug/dL — ABNORMAL HIGH (ref 120–384)

## 2020-06-01 LAB — RETICULOCYTES
Immature Retic Fract: 31.5 % — ABNORMAL HIGH (ref 2.3–15.9)
RBC.: 2.31 MIL/uL — ABNORMAL LOW (ref 3.87–5.11)
Retic Count, Absolute: 64.2 10*3/uL (ref 19.0–186.0)
Retic Ct Pct: 2.8 % (ref 0.4–3.1)

## 2020-06-01 LAB — FERRITIN: Ferritin: 239 ng/mL (ref 11–307)

## 2020-06-01 LAB — ABO/RH: ABO/RH(D): O POS

## 2020-06-01 LAB — SAMPLE TO BLOOD BANK

## 2020-06-01 LAB — PREPARE RBC (CROSSMATCH)

## 2020-06-01 LAB — VITAMIN B12: Vitamin B-12: 866 pg/mL (ref 180–914)

## 2020-06-01 LAB — LACTATE DEHYDROGENASE: LDH: 263 U/L — ABNORMAL HIGH (ref 98–192)

## 2020-06-01 MED ORDER — METHYLPREDNISOLONE SODIUM SUCC 40 MG IJ SOLR
40.0000 mg | Freq: Once | INTRAMUSCULAR | Status: AC
  Administered 2020-06-01: 40 mg via INTRAVENOUS

## 2020-06-01 MED ORDER — FUROSEMIDE 10 MG/ML IJ SOLN
INTRAMUSCULAR | Status: AC
  Filled 2020-06-01: qty 2

## 2020-06-01 MED ORDER — METHYLPREDNISOLONE SODIUM SUCC 40 MG IJ SOLR
INTRAMUSCULAR | Status: AC
  Filled 2020-06-01: qty 1

## 2020-06-01 MED ORDER — ACETAMINOPHEN 325 MG PO TABS
650.0000 mg | ORAL_TABLET | Freq: Once | ORAL | Status: AC
  Administered 2020-06-01: 650 mg via ORAL

## 2020-06-01 MED ORDER — SODIUM CHLORIDE 0.9% IV SOLUTION
250.0000 mL | Freq: Once | INTRAVENOUS | Status: DC
  Filled 2020-06-01: qty 250

## 2020-06-01 MED ORDER — SODIUM CHLORIDE 0.9% IV SOLUTION
250.0000 mL | Freq: Once | INTRAVENOUS | Status: AC
  Administered 2020-06-01: 250 mL via INTRAVENOUS
  Filled 2020-06-01: qty 250

## 2020-06-01 MED ORDER — FUROSEMIDE 10 MG/ML IJ SOLN
20.0000 mg | Freq: Once | INTRAMUSCULAR | Status: AC
  Administered 2020-06-01: 20 mg via INTRAVENOUS

## 2020-06-01 MED ORDER — ACETAMINOPHEN 325 MG PO TABS
ORAL_TABLET | ORAL | Status: AC
  Filled 2020-06-01: qty 2

## 2020-06-01 NOTE — Patient Instructions (Signed)

## 2020-06-01 NOTE — Progress Notes (Signed)
CRITICAL VALUE STICKER CRITICAL VALUE: WBC 101.2; HGB 6.5 RECEIVER (on-site recipient of call): S.Malcolm Hetz, RN DATE & TIME NOTIFIED: 06/01/20; 0940 MESSENGER (representative from lab):Jennifer MD NOTIFIED: Dr.Kale  TIME OF NOTIFICATION: 0945 RESPONSE: Information acknowledged/ orders for PRBCs received

## 2020-06-02 LAB — KAPPA/LAMBDA LIGHT CHAINS
Kappa free light chain: 126.2 mg/L — ABNORMAL HIGH (ref 3.3–19.4)
Kappa, lambda light chain ratio: 0.07 — ABNORMAL LOW (ref 0.26–1.65)
Lambda free light chains: 1684.8 mg/L — ABNORMAL HIGH (ref 5.7–26.3)

## 2020-06-02 LAB — TYPE AND SCREEN
ABO/RH(D): O POS
Antibody Screen: NEGATIVE
Unit division: 0
Unit division: 0

## 2020-06-02 LAB — BPAM RBC
Blood Product Expiration Date: 202111252359
Blood Product Expiration Date: 202111252359
ISSUE DATE / TIME: 202110271159
ISSUE DATE / TIME: 202110271400
Unit Type and Rh: 5100
Unit Type and Rh: 5100

## 2020-06-02 LAB — FOLATE RBC
Folate, Hemolysate: 465 ng/mL
Folate, RBC: 2123 ng/mL (ref >498)
Hematocrit: 21.9 % — ABNORMAL LOW (ref 34.0–46.6)

## 2020-06-03 LAB — MULTIPLE MYELOMA PANEL, SERUM
Albumin SerPl Elph-Mcnc: 3.5 g/dL (ref 2.9–4.4)
Albumin/Glob SerPl: 0.9 (ref 0.7–1.7)
Alpha 1: 0.3 g/dL (ref 0.0–0.4)
Alpha2 Glob SerPl Elph-Mcnc: 0.5 g/dL (ref 0.4–1.0)
B-Globulin SerPl Elph-Mcnc: 1 g/dL (ref 0.7–1.3)
Gamma Glob SerPl Elph-Mcnc: 2.4 g/dL — ABNORMAL HIGH (ref 0.4–1.8)
Globulin, Total: 4.3 g/dL — ABNORMAL HIGH (ref 2.2–3.9)
IgA: 319 mg/dL (ref 64–422)
IgG (Immunoglobin G), Serum: 2521 mg/dL — ABNORMAL HIGH (ref 586–1602)
IgM (Immunoglobulin M), Srm: 175 mg/dL (ref 26–217)
Total Protein ELP: 7.8 g/dL (ref 6.0–8.5)

## 2020-06-03 NOTE — Progress Notes (Signed)
Things That May Be Affecting Your Health:  Alcohol  Hearing loss  Pain    Depression  Home Safety  Sexual Health   Diabetes  Lack of physical activity  Stress   Difficulty with daily activities  Loneliness  Tiredness   Drug use  Medicines  Tobacco use   Falls  Motor Vehicle Safety  Weight   Food choices  Oral Health X Other - heart failure    YOUR PERSONALIZED HEALTH PLAN : 1. Schedule your next subsequent Medicare Wellness visit in one year 2. Attend all of your regular appointments to address your medical issues 3. Complete the preventative screenings and services   Annual Wellness Visit   Medicare Covered Preventative Screenings and [place] Men and Women Who How Often Need? Date of Last Service Action  Abdominal Aortic Aneurysm Adults with AAA risk factors Once     Alcohol Misuse and Counseling All Adults Screening once a year if no alcohol misuse. Counseling up to 4 face to face sessions.     Bone Density Measurement  Adults at risk for osteoporosis Once every 2 yrs No 2020   Lipid Panel Z13.6 All adults without CV disease Once every 5 yrs Yes 2017   Colorectal Cancer   Stool sample or  Colonoscopy All adults 50 and older   Once every year  Every 10 years Yes 2004 She is overdue for screening, will need discussion about scheduling a colonoscopy.  Stool sample not appropriate for her.   Depression All Adults Once a year  Today   Diabetes Screening Blood glucose, post glucose load, or GTT Z13.1  All adults at risk  Pre-diabetics  Once per year  Twice per year No    Diabetes  Self-Management Training All adults Diabetics 10 hrs first year; 2 hours subsequent years. Requires Copay     Glaucoma  Diabetics  Family history of glaucoma  African Americans 50 yrs +  Hispanic Americans 65 yrs + Annually - requires coppay Yes  Ask her if she has gone to the eye doctor.   Hepatitis C Z72.89 or F19.20  High Risk for HCV  Born between 1945 and  1965  Annually  Once No    HIV Z11.4 All adults based on risk  Annually btw ages 11 & 24 regardless of risk  Annually > 65 yrs if at increased risk No    Lung Cancer Screening Asymptomatic adults aged 48-77 with 50 pack yr history and current smoker OR quit within the last 15 yrs Annually Must have counseling and shared decision making documentation before first screen No    Medical Nutrition Therapy Adults with   Diabetes  Renal disease  Kidney transplant within past 3 yrs 3 hours first year; 2 hours subsequent years     Obesity and Counseling All adults Screening once a year Counseling if BMI 30 or higher Yes Today   Tobacco Use Counseling Adults who use tobacco  Up to 8 visits in one year     Vaccines Z23  Hepatitis B  Influenza   Pneumonia  Adults   Once  Once every flu season  Two different vaccines separated by one year No    Next Annual Wellness Visit People with Medicare Every year  Today     Services & Screenings Women Who How Often Need  Date of Last Service Action  Mammogram  Z12.31 Women over 35 One baseline ages 44-39. Annually ager 40 yrs+ yes 2020   Pap  tests All women Annually if high risk. Every 2 yrs for normal risk women No    Screening for cervical cancer with   Pap (Z01.419 nl or Z01.411abnl) &  HPV Z11.51 Women aged 3 to 41 Once every 5 yrs     Screening pelvic and breast exams All women Annually if high risk. Every 2 yrs for normal risk women     Sexually Transmitted Diseases  Chlamydia  Gonorrhea  Syphilis All at risk adults Annually for non pregnant females at increased risk         [place] Men Who How Ofter Need  Date of Last Service Action  Prostate Cancer - DRE & PSA Men over 50 Annually.  DRE might require a copay.     Sexually Transmitted Diseases  Syphilis All at risk adults Annually for men at increased risk

## 2020-06-06 ENCOUNTER — Other Ambulatory Visit: Payer: Self-pay | Admitting: Internal Medicine

## 2020-06-06 ENCOUNTER — Ambulatory Visit: Payer: PPO | Admitting: *Deleted

## 2020-06-06 DIAGNOSIS — I5022 Chronic systolic (congestive) heart failure: Secondary | ICD-10-CM | POA: Diagnosis not present

## 2020-06-06 DIAGNOSIS — I10 Essential (primary) hypertension: Secondary | ICD-10-CM | POA: Diagnosis not present

## 2020-06-06 DIAGNOSIS — I5032 Chronic diastolic (congestive) heart failure: Secondary | ICD-10-CM

## 2020-06-06 DIAGNOSIS — J4540 Moderate persistent asthma, uncomplicated: Secondary | ICD-10-CM

## 2020-06-06 DIAGNOSIS — I480 Paroxysmal atrial fibrillation: Secondary | ICD-10-CM

## 2020-06-06 NOTE — Chronic Care Management (AMB) (Signed)
Chronic Care Management   Follow Up Note   06/06/2020 Name: Erika [name] MRN: [id number] DOB: 1941/03/09  Referred by: [name], MD Reason for referral : Chronic Care Management (HF, HTN, AFib, Asthma, myeloproliferative neoplasm)   Erika [name] is a 79 y.o. year old female who is a primary care patient of [name], MD. The CCM team was consulted for assistance with chronic disease management and care coordination needs.    Review of patient status, including review of consultants reports, relevant laboratory and other test results, and collaboration with appropriate care team members and the patient's provider was performed as part of comprehensive patient evaluation and provision of chronic care management services.    SDOH (Social Determinants of Health) assessments performed: No See Care Plan activities for detailed interventions related to [facility])     Outpatient Encounter Medications as of 06/06/2020  Medication Sig Note  . ADVAIR DISKUS 500-50 MCG/DOSE AEPB Inhale 1 puff into the lungs 2 (two) times daily.   [name] albuterol (VENTOLIN HFA) 108 (90 Base) MCG/ACT inhaler Inhale 1-2 puffs into the lungs every 6 (six) hours as needed for wheezing or shortness of breath. 12/01/2019: States she uses once daily not prn- clarified purpose and use of rescue inhaler  . allopurinol (ZYLOPRIM) 300 MG tablet Take 0.5 tablets (150 mg total) by mouth 2 (two) times daily.   [name] amLODipine (NORVASC) 10 MG tablet Take 0.5 tablets (5 mg total) by mouth daily.   [name] apixaban (ELIQUIS) 5 MG TABS tablet Take 1 tablet (5 mg total) by mouth 2 (two) times daily.   [name] aspirin EC 81 MG tablet Take 1 tablet (81 mg total) by mouth daily.   . Ensure Max Protein (ENSURE MAX PROTEIN) LIQD Take 330 mLs (11 oz total) by mouth 2 (two) times daily. (Patient taking differently: Take 11 oz by mouth 2 (two) times daily. She drinks regular Ensure not Ensure Max and she prefers chocolate) 12/01/2019: Will contact [employer]  rep for coupons and/or free samples  . fluticasone (FLONASE) 50 MCG/ACT nasal spray SHAKE LIQUID AND INSTILL ONE (1) SPRAY IN EACH NOSTRIL ONCE DAILY   . furosemide (LASIX) 40 MG tablet Take 1 tablet (40 mg total) by mouth 2 (two) times daily for 5 days.   . metoprolol tartrate (LOPRESSOR) 50 MG tablet Take 1 tablet (50 mg total) by mouth 2 (two) times daily. 12/01/2019: Says she is taking 1/2 of a 25 mg tablet once daily- will clarify dosage with provider  . NYAMYC powder APPLY TOPICALLY TWICE DAILY   . omeprazole (PRILOSEC) 20 MG capsule Take 1 capsule (20 mg total) by mouth daily.   . potassium chloride (KLOR-CON) 10 MEQ tablet Take 2 tablets (20 mEq total) by mouth daily for 5 days. 12/01/2019: Patient states she is not taking K+   . pravastatin (PRAVACHOL) 20 MG tablet Take 1 tablet (20 mg total) by mouth daily.   . sodium chloride (OCEAN) 0.65 % SOLN nasal spray Place 1 spray into both nostrils as needed for congestion.    No facility-administered encounter medications on file as of 06/06/2020.     Objective:  Wt Readings from Last 3 Encounters:  06/01/20 262 lb 4.8 oz (119 kg)  05/27/20 265 lb (120.2 kg)  05/23/20 266 lb 3.2 oz (120.7 kg)   BP Readings from Last 3 Encounters:  06/01/20 (!) 141/57  06/01/20 125/66  05/27/20 (!) 144/60    Goals Addressed  This Visit's Progress     Patient Stated   .  "I feel so much better now that I've completed home health physical therapy and can drive again. " (pt-stated)        CARE PLAN ENTRY (see longitudinal plan of care for additional care plan information)   Current Barriers:  [name] Knowledge deficit related to basic heart failure pathophysiology and self care management- successful outreach to patient to complete follow up assessment, patient states she had some recent leg swelling so she made a clinic appointment and was told to increase her diuretic and she was referred to the heart failure at home program through [place], she also says she saw her oncologist [place] 10/27 and had to received 2 units of PRBC because her Hgb was low, she denies dyspnea different from her baseline and says " I've dealt with low iron for many years so I don't really notice when my Hgb gets low",  she says her lower extremity swelling is a little better, she says she does not elevate her legs when sitting as she does not have a recliner. She continues to voice good understanding of her treatment plan for HF and, HTN a fib and asthma, she says she continues to weigh herself daily and record her weights in the [facility] calendar this CCM RN mailed to her. She says she continues to drink chocolate Ensure and would appreciate any samples she can get.  Case Manager Clinical Goal(s):  [name] Over the next 30 - 60 days, patient will weigh self daily and record- met . Over the next 30 - 60 days, patient will verbalize understanding of Heart Failure Action Plan and when to call doctor- met . Over the next 30 - 60 days, patient will take all Heart Failure mediations as prescribed- not met patient taking metoprolol only daily not twice daily and is taking 1/2 of table, not a whole tablet . Over the next 30 - 60 days, patient will weigh daily and record (notifying MD of 3 lb weight gain over night or 5 lb in a week)- met  Interventions:  . Reviewed Heart Failure, asthma, a fib and HTN  treatment plans  . Reviewed medications with patient and discussed medication taking behavior . Discussed plans with patient for ongoing care management follow up and provided patient with direct contact information for care management team . Reviewed weight gain parameters indicating need for  provider notification  . Reviewed role of diuretics in prevention of fluid overload and management of heart failure . Provided positive reinforcement for patient's excellent disease self management skills . Reviewed scheduled/upcoming provider  appointments  . Will deliver chocolate Ensure to patient at her appointment with Dr Irene Limbo on 07/04/20  Patient Self Care Activities:  . Takes Heart Failure Medications as prescribed . Weighs daily and record (notifying MD of 3 lb weight gain over night or 5 lb in a week) . Verbalizes understanding of and follows CHF Action Plan . Adheres to low sodium diet, heart healthy diet  Please see past updates related to this goal by clicking on the "Past Updates" button in the selected goal          Plan:   The care management team will reach out to the patient again over the next 30-60 days.    Kelli Churn RN, CCM, [facility] RN Care Manager [phone]

## 2020-06-06 NOTE — Patient Instructions (Signed)
Visit Information It was nice speaking with you today. Goals Addressed              This Visit's Progress     Patient Stated   .  "I feel so much better now that I've completed home health physical therapy and can drive again. " (pt-stated)        CARE PLAN ENTRY (see longitudinal plan of care for additional care plan information)   Current Barriers:  [name] Knowledge deficit related to basic heart failure pathophysiology and self care management- successful outreach to patient to complete follow up assessment, patient states she had some recent leg swelling so she made a clinic appointment and was told to increase her diuretic and she was referred to the heart failure at home program through [place], she also says she saw her oncologist [place] 10/27 and had to received 2 units of PRBC because her Hgb was low, she denies dyspnea different from her baseline and says " I've dealt with low iron for many years so I don't really notice when my Hgb gets low",  she says her lower extremity swelling is a little better, she says she does not elevate her legs when sitting as she does not have a recliner. She continues to voice good understanding of her treatment plan for HF and, HTN a fib and asthma, she says she continues to weigh herself daily and record her weights in the [place] [facility] calendar this CCM RN mailed to her. She says she continues to drink chocolate Ensure and would appreciate any samples she can get.  Case Manager Clinical Goal(s):  [name] Over the next 30 - 60 days, patient will weigh self daily and record- met . Over the next 30 - 60 days, patient will verbalize understanding of Heart Failure Action Plan and when to call doctor- met . Over the next 30 - 60 days, patient will take all Heart Failure mediations as prescribed- not met patient taking metoprolol only daily not twice daily and is taking 1/2 of table, not a whole tablet . Over the next 30 - 60 days,  patient will weigh daily and record (notifying MD of 3 lb weight gain over night or 5 lb in a week)- met  Interventions:  . Reviewed Heart Failure, asthma, a fib and HTN  treatment plans  . Reviewed medications with patient and discussed medication taking behavior . Discussed plans with patient for ongoing care management follow up and provided patient with direct contact information for care management team . Reviewed weight gain parameters indicating need for  provider notification  . Reviewed role of diuretics in prevention of fluid overload and management of heart failure . Provided positive reinforcement for patient's excellent disease self management skills . Reviewed scheduled/upcoming provider appointments  . Will deliver chocolate Ensure to patient at her appointment with Dr Irene Limbo on 07/04/20  Patient Self Care Activities:  . Takes Heart Failure Medications as prescribed . Weighs daily and record (notifying MD of 3 lb weight gain over night or 5 lb in a week) . Verbalizes understanding of and follows CHF Action Plan . Adheres to low sodium diet, heart healthy diet  Please see past updates related to this goal by clicking on the "Past Updates" button in the selected goal         The patient verbalized understanding of instructions provided today and declined a print copy of patient instruction materials.   The care management team will reach  out to the patient again over the next 30-60 days.   Kelli Churn RN, CCM, [facility] RN Care Manager [phone]

## 2020-06-07 ENCOUNTER — Ambulatory Visit: Payer: PPO | Admitting: *Deleted

## 2020-06-07 DIAGNOSIS — I5032 Chronic diastolic (congestive) heart failure: Secondary | ICD-10-CM

## 2020-06-07 DIAGNOSIS — J4540 Moderate persistent asthma, uncomplicated: Secondary | ICD-10-CM

## 2020-06-07 DIAGNOSIS — I480 Paroxysmal atrial fibrillation: Secondary | ICD-10-CM

## 2020-06-07 DIAGNOSIS — I10 Essential (primary) hypertension: Secondary | ICD-10-CM

## 2020-06-07 NOTE — Progress Notes (Signed)
Internal Medicine Clinic Resident  I have personally reviewed this encounter including the documentation in this note and/or discussed this patient with the care management provider. I will address any urgent items identified by the care management provider and will communicate my actions to the patient's PCP. I have reviewed the patient's CCM visit with my supervising attending, Dr Williams.  Caedence Snowden M Dillan Candela, MD 06/07/2020    

## 2020-06-07 NOTE — Progress Notes (Signed)
Internal Medicine Attestation: This CCM encounter has been discussed with resident physician, reviewed, and approved. Bhavya Grand, MD  

## 2020-06-07 NOTE — Chronic Care Management (AMB) (Signed)
Chronic Care Management   Follow Up Note   06/07/2020 Name: Erika [name] MRN: [id number] DOB: 10/07/1940  Referred by: [name], MD Reason for referral : Chronic Care Management (HF, HTN, AFib, Asthma, myeloproliferative neoplasm)   Erika [name] is a 79 y.o. year old female who is a primary care patient of [name], MD. The CCM team was consulted for assistance with chronic disease management and care coordination needs.    Review of patient status, including review of consultants reports, relevant laboratory and other test results, and collaboration with appropriate care team members and the patient's provider was performed as part of comprehensive patient evaluation and provision of chronic care management services.    SDOH (Social Determinants of Health) assessments performed: No See Care Plan activities for detailed interventions related to [facility])     Outpatient Encounter Medications as of 06/07/2020  Medication Sig Note  . ADVAIR DISKUS 500-50 MCG/DOSE AEPB Inhale 1 puff into the lungs 2 (two) times daily.   [name] albuterol (VENTOLIN HFA) 108 (90 Base) MCG/ACT inhaler Inhale 1-2 puffs into the lungs every 6 (six) hours as needed for wheezing or shortness of breath. 12/01/2019: States she uses once daily not prn- clarified purpose and use of rescue inhaler  . allopurinol (ZYLOPRIM) 300 MG tablet Take 0.5 tablets (150 mg total) by mouth 2 (two) times daily.   [name] amLODipine (NORVASC) 10 MG tablet Take 0.5 tablets (5 mg total) by mouth daily.   [name] apixaban (ELIQUIS) 5 MG TABS tablet Take 1 tablet (5 mg total) by mouth 2 (two) times daily.   [name] aspirin EC 81 MG tablet Take 1 tablet (81 mg total) by mouth daily.   . Ensure Max Protein (ENSURE MAX PROTEIN) LIQD Take 330 mLs (11 oz total) by mouth 2 (two) times daily. (Patient taking differently: Take 11 oz by mouth 2 (two) times daily. She drinks regular Ensure not Ensure Max and she prefers chocolate) 12/01/2019: Will contact [employer]  rep for coupons and/or free samples  . fluticasone (FLONASE) 50 MCG/ACT nasal spray SHAKE LIQUID AND INSTILL ONE (1) SPRAY IN EACH NOSTRIL ONCE DAILY   . furosemide (LASIX) 40 MG tablet Take 1 tablet (40 mg total) by mouth 2 (two) times daily for 5 days.   . metoprolol tartrate (LOPRESSOR) 50 MG tablet Take 1 tablet (50 mg total) by mouth 2 (two) times daily. 12/01/2019: Says she is taking 1/2 of a 25 mg tablet once daily- will clarify dosage with provider  . NYAMYC powder APPLY TOPICALLY TWICE DAILY   . omeprazole (PRILOSEC) 20 MG capsule Take 1 capsule (20 mg total) by mouth daily.   . potassium chloride (KLOR-CON) 10 MEQ tablet Take 2 tablets (20 mEq total) by mouth daily for 5 days. 12/01/2019: Patient states she is not taking K+   . pravastatin (PRAVACHOL) 20 MG tablet Take 1 tablet (20 mg total) by mouth daily.   . sodium chloride (OCEAN) 0.65 % SOLN nasal spray Place 1 spray into both nostrils as needed for congestion.    No facility-administered encounter medications on file as of 06/07/2020.     Objective:  Wt Readings from Last 3 Encounters:  06/01/20 262 lb 4.8 oz (119 kg)  05/27/20 265 lb (120.2 kg)  05/23/20 266 lb 3.2 oz (120.7 kg)   BP Readings from Last 3 Encounters:  06/01/20 (!) 141/57  06/01/20 125/66  05/27/20 (!) 144/60    Goals Addressed  This Visit's Progress     Patient Stated   .  "I feel so much better now that I've completed home health physical therapy and can drive again. " (pt-stated)        CARE PLAN ENTRY (see longitudinal plan of care for additional care plan information)   Current Barriers:  [name] Knowledge deficit related to basic heart failure pathophysiology and self care management- successful outreach to patient to complete follow up assessment, patient states she had some recent leg swelling so she made a clinic appointment and was told to increase her diuretic and she was referred to the heart failure at home program through [place], she also says she saw her oncologist Erika [name] 10/27 and had to received 2 units of PRBC because her Hgb was low, she denies dyspnea different from her baseline and says " I've dealt with low iron for many years so I don't really notice when my Hgb gets low",  she says her lower extremity swelling is a little better, she says she does not elevate her legs when sitting as she does not have a recliner. She continues to voice good understanding of her treatment plan for HF and, HTN a fib and asthma, she says she continues to weigh herself daily and record her weights in the [place] [facility] calendar this CCM RN mailed to her. She says she continues to drink chocolate Ensure and would appreciate any samples she can get.  Case Manager Clinical Goal(s):  [name] Over the next 30 - 60 days, patient will weigh self daily and record- met . Over the next 30 - 60 days, patient will verbalize understanding of Heart Failure Action Plan and when to call doctor- met . Over the next 30 - 60 days, patient will take all Heart Failure mediations as prescribed- not met patient taking metoprolol only daily not twice daily and is taking 1/2 of table, not a whole tablet . Over the next 30 - 60 days, patient will weigh daily and record (notifying MD of 3 lb weight gain over night or 5 lb in a week)- met  Interventions:  . Reviewed Heart Failure, asthma, a fib and HTN  treatment plans  . Reviewed medications with patient and discussed medication taking behavior . Discussed plans with patient for ongoing care management follow up and provided patient with direct contact information for care management team . Reviewed weight gain parameters indicating need for  provider notification  . Reviewed role of diuretics in prevention of fluid overload and management of heart failure . Provided positive reinforcement for patient's excellent disease self management skills . Reviewed scheduled/upcoming provider  appointments  . Will deliver chocolate Ensure to patient at her appointment with Dr Irene Limbo on 07/04/20 . 06/07/20 Collaborated with Octavio Graves RN with [facility] to determine if patient is receiving services from [facility] " patient is in the at home heart failure program with most recent visit by Lorrin Mais RN on 06/06/20. She has also been seen by [name] our PTA for mobility work since she was started on service and is in week 1 of the Transition of Care phase where she will be closely monitored for the rest of November, just not quite as intense as in the acute phase. At the end of the month if she agrees we can move her into our home base primary care service since she is [facility]. "  Patient Self Care Activities:  . Takes Heart Failure Medications  as prescribed . Weighs daily and record (notifying MD of 3 lb weight gain over night or 5 lb in a week) . Verbalizes understanding of and follows CHF Action Plan . Adheres to low sodium diet, heart healthy diet  Please see past updates related to this goal by clicking on the "Past Updates" button in the selected goal          Plan:   The care management team will reach out to the patient again over the next 30-60 days.    Kelli Churn RN, CCM, [facility] RN Care Manager [phone]

## 2020-06-08 ENCOUNTER — Other Ambulatory Visit: Payer: Self-pay | Admitting: Internal Medicine

## 2020-06-08 DIAGNOSIS — I5032 Chronic diastolic (congestive) heart failure: Secondary | ICD-10-CM

## 2020-06-08 NOTE — Progress Notes (Signed)
Internal Medicine Clinic Attending  CCM services provided by the care management provider and their documentation were discussed with Dr. Sherry Ruffing. We reviewed the pertinent findings, urgent action items addressed by the resident and non-urgent items to be addressed by the PCP.  I agree with the assessment, diagnosis, and plan of care documented in the CCM and resident's note.  Gilles Chiquito, MD 06/08/2020

## 2020-06-08 NOTE — Progress Notes (Signed)
Internal Medicine Clinic Resident  I have personally reviewed this encounter including the documentation in this note and/or discussed this patient with the care management provider. I will address any urgent items identified by the care management provider and will communicate my actions to the patient's PCP. I have reviewed the patient's CCM visit with my supervising attending, Dr Hoffman.  Enez Monahan M Rashaad Hallstrom, MD 06/08/2020    

## 2020-06-09 ENCOUNTER — Other Ambulatory Visit: Payer: Self-pay

## 2020-06-09 ENCOUNTER — Ambulatory Visit (INDEPENDENT_AMBULATORY_CARE_PROVIDER_SITE_OTHER): Payer: PPO | Admitting: Internal Medicine

## 2020-06-09 ENCOUNTER — Encounter: Payer: Self-pay | Admitting: Internal Medicine

## 2020-06-09 DIAGNOSIS — Z1231 Encounter for screening mammogram for malignant neoplasm of breast: Secondary | ICD-10-CM

## 2020-06-09 DIAGNOSIS — Z1211 Encounter for screening for malignant neoplasm of colon: Secondary | ICD-10-CM | POA: Diagnosis not present

## 2020-06-09 DIAGNOSIS — Z0000 Encounter for general adult medical examination without abnormal findings: Secondary | ICD-10-CM | POA: Diagnosis not present

## 2020-06-09 NOTE — Progress Notes (Addendum)
This AWV is being conducted by [facility] only. The patient was located at home and I was located in [facility] The patient's identity was confirmed using their DOB and current address. The patient or his/her legal guardian has consented to being evaluated through a telephone encounter and understands the associated risks (an examination cannot be done and the patient may need to come in for an appointment) / benefits (allows the patient to remain at home, decreasing exposure to coronavirus). I personally spent 25 minutes conducting the AWV.  Subjective:   Erika [name] is a 79 y.o. female who presents for a Medicare Annual Wellness Visit.  The following items have been reviewed and updated today in the appropriate area in the EMR.   Health Risk Assessment  Height, weight, BMI, and BP Visual acuity if needed Depression screen Fall risk / safety level Advance directive discussion Medical and family history were reviewed and updated Updating list of other providers & suppliers Medication reconciliation, including over the counter medicines Cognitive screen Written screening schedule Risk Factor list Personalized health advice, risky behaviors, and treatment advice  Social History   Social History Narrative   Current Social History 06/09/2020      Patient lives alone in a ground floor apartment which is 1 story. There are not steps up to the entrance the patient uses. One son stays with her once in a while.      Patient's method of transportation is personal car.      The highest level of education was some high school: 11th grade.      The patient currently retired.      Identified important Relationships are "My friend Mart Piggs, my nieces, my grandkids, a lot of people.       Pets : None       Interests / Fun: "Playing word games on my phone, watch TV, Talking on phone."       Current Stressors: "Nothing, really. I try not to let anything stress me."       Religious  / Personal Beliefs: "I believe in God."       [name], BSN, RN-BC             Objective:    Vitals: There were no vitals taken for this visit. Vitals are unable to obtained due to EOFHQ-19 public health emergency  Activities of Daily Living In your present state of health, do you have any difficulty performing the following activities: 06/09/2020 05/27/2020  Hearing? N N  Vision? N -  Comment - -  Difficulty concentrating or making decisions? N N  Walking or climbing stairs? Y Y  Comment Stairs leg pain  Dressing or bathing? N N  Doing errands, shopping? N N  In the past six months, have you accidently leaked urine? - -  Managing your Medications? - -  Some recent data might be hidden    Goals Goals    .  "I feel so much better now that I've completed home health physical therapy and can drive again. " (pt-stated)      CARE PLAN ENTRY (see longitudinal plan of care for additional care plan information)   Current Barriers:  [name] Knowledge deficit related to basic heart failure pathophysiology and self care management- successful outreach to patient to complete follow up assessment, patient states she had some recent leg swelling so she made a clinic appointment and was told to increase her diuretic and she was referred to  the heart failure at home program through [facility], she also says she saw her oncologist ain 10/27 and had to received 2 units of PRBC because her Hgb was low, she denies dyspnea different from her baseline and says " I've dealt with low iron for many years so I don't really notice when my Hgb gets low",  she says her lower extremity swelling is a little better, she says she does not elevate her legs when sitting as she does not have a recliner. She continues to voice good understanding of her treatment plan for HF and, HTN a fib and asthma, she says she continues to weigh herself daily and record her weights in the [facility]  calendar this CCM RN mailed to her. She says she continues to drink chocolate Ensure and would appreciate any samples she can get.  Case Manager Clinical Goal(s):  [name] Over the next 30 - 60 days, patient will weigh self daily and record- met . Over the next 30 - 60 days, patient will verbalize understanding of Heart Failure Action Plan and when to call doctor- met . Over the next 30 - 60 days, patient will take all Heart Failure mediations as prescribed- not met patient taking metoprolol only daily not twice daily and is taking 1/2 of table, not a whole tablet . Over the next 30 - 60 days, patient will weigh daily and record (notifying MD of 3 lb weight gain over night or 5 lb in a week)- met  Interventions:  . Reviewed Heart Failure, asthma, a fib and HTN  treatment plans  . Reviewed medications with patient and discussed medication taking behavior . Discussed plans with patient for ongoing care management follow up and provided patient with direct contact information for care management team . Reviewed weight gain parameters indicating need for  provider notification  . Reviewed role of diuretics in prevention of fluid overload and management of heart failure . Provided positive reinforcement for patient's excellent disease self management skills . Reviewed scheduled/upcoming provider appointments  . Will deliver chocolate Ensure to patient at her appointment with Dr Irene Limbo on 07/04/20 . 06/07/20 Collaborated with Octavio Graves RN with [facility] to determine if patient is receiving services from [facility] " patient is in the at home heart failure program with most recent visit by Lorrin Mais RN on 06/06/20. She has also been seen by [name] our PTA for mobility work since she was started on service and is in week 1 of the Transition of Care phase where she will be closely monitored for the rest of November, just not quite as intense as in the acute phase. At the end of the month if she agrees  we can move her into our home base primary care service since she is [facility]. "  Patient Self Care Activities:  . Takes Heart Failure Medications as prescribed . Weighs daily and record (notifying MD of 3 lb weight gain over night or 5 lb in a week) . Verbalizes understanding of and follows CHF Action Plan . Adheres to low sodium diet, heart healthy diet  Please see past updates related to this goal by clicking on the "Past Updates" button in the selected goal      .  Exercise 7x per week (10 min per time)      Seated and standing exercises     .  Weight (lb) < 274 lb (124.3 kg) (pt-stated)      5% weight  loss      Patient surpassed 5% weight loss goal to weigh < 274 lb. She is now at 262 lbs. She does not want to make another weight loss goal at this time.  Fall Risk Fall Risk  06/09/2020 05/27/2020 05/23/2020 11/24/2019 10/30/2019  Falls in the past year? 0 0 0 0 0  Number falls in past yr: - - - 0 -  Injury with Fall? - - - 0 -  Risk for fall due to : Impaired balance/gait;Impaired mobility Impaired balance/gait;Impaired mobility Impaired balance/gait Impaired balance/gait Impaired balance/gait;Impaired mobility  Risk for fall due to: Comment - - - - -  Follow up Education provided;Falls prevention discussed Falls prevention discussed Falls prevention discussed - Falls prevention discussed   Patient continues to do seated and standing exercises everyday for 10 minutes to help with strength and balance  Depression Screen PHQ 2/9 Scores 06/09/2020 05/27/2020 05/23/2020 11/24/2019  PHQ - 2 Score 0 0 0 0  PHQ- 9 Score 0 - - 0  Exception Documentation - - - -     Cognitive Testing Six-Item Cognitive Screener   "I would like to ask you some questions that ask you to use your memory. I am going to name three objects. Please wait until I say all three words, then repeat them. Remember what they are  because I am going to ask you to name them again in a few minutes. Please repeat these  words for me: APPLE--TABLE--PENNY." (Interviewer may repeat names 3 times if necessary but repetition not scored.)  Did patient correctly repeat all three words? Yes - may proceed with screen  What year is this? Correct What month is this? Correct What day of the week is this? Correct  What were the three objects I asked you to remember? . Apple Correct . Table Correct . Penny Correct  Score one point for each incorrect answer.  A score of 2 or more points warrants additional investigation.  Patient's score 0   Assessment and Plan:     Referral placed to [facility] for mammogram Referral placed to GI for colonoscopy. Patient instructed to contact Dr. Zenia Resides office to schedule eye exam  During the course of the visit the patient was educated and counseled about appropriate screening and preventive services as documented in the assessment and plan.  The printed AVS was given to the patient and included an updated screening schedule, a list of risk factors, and personalized health advice.        Velora Heckler, RN  06/09/2020

## 2020-06-09 NOTE — Patient Instructions (Addendum)
Things That May Be Affecting Your Health:  Alcohol  Hearing loss  Pain    Depression  Home Safety  Sexual Health   Diabetes  Lack of physical activity  Stress   Difficulty with daily activities  Loneliness  Tiredness   Drug use  Medicines  Tobacco use   Falls  Motor Vehicle Safety  Weight   Food choices  Oral Health X Other - heart failure    YOUR PERSONALIZED HEALTH PLAN : 1. Schedule your next subsequent Medicare Wellness visit in one year 2. Attend all of your regular appointments to address your medical issues 3. Complete the preventative screenings and services 4. A referral has been placed to GI for a colonoscopy 5. Please call Dr. Zenia Resides office to schedule eye exam 6. A referral has been placed to [facility] for a screening mammogram 7. Please continue to weigh yourself every morning and call our office if there is a 2-3 lb weight gain overnight.  Annual Wellness Visit                       Medicare Covered Preventative Screenings and Services  Services & Screenings Men and Women Who How Often Need? Date of Last Service Action  Abdominal Aortic Aneurysm Adults with AAA risk factors Once     Alcohol Misuse and Counseling All Adults Screening once a year if no alcohol misuse. Counseling up to 4 face to face sessions.     Bone Density Measurement  Adults at risk for osteoporosis Once every 2 yrs No 2020   Lipid Panel Z13.6 All adults without CV disease Once every 5 yrs Yes 2017   Colorectal Cancer   Stool sample or  Colonoscopy All adults 64 and older   Once every year  Every 10 years Yes 2004 A referral has been placed to GI for a colonoscopy  Depression All Adults Once a year  Today   Diabetes Screening Blood glucose, post glucose load, or GTT Z13.1  All adults at risk  Pre-diabetics  Once per year  Twice per year No    Diabetes  Self-Management Training All adults Diabetics 10 hrs first year; 2 hours subsequent  years. Requires Copay     Glaucoma  Diabetics  Family history of glaucoma  African Americans 39 yrs +  Hispanic Americans 63 yrs + Annually - requires coppay Yes  Please call Dr. Zenia Resides office to schedule eye exam  Hepatitis C Z72.89 or F19.20  High Risk for HCV  Born between 1945 and 1965  Annually  Once No    HIV Z11.4 All adults based on risk  Annually btw ages 51 & 47 regardless of risk  Annually > 65 yrs if at increased risk No    Lung Cancer Screening Asymptomatic adults aged 15-77 with 30 pack yr history and current smoker OR quit within the last 15 yrs Annually Must have counseling and shared decision making documentation before first screen No    Medical Nutrition Therapy Adults with   Diabetes  Renal disease  Kidney transplant within past 3 yrs 3 hours first year; 2 hours subsequent years     Obesity and Counseling All adults Screening once a year Counseling if BMI 30 or higher Yes Today   Tobacco Use Counseling Adults who use tobacco  Up to 8 visits in one year     Vaccines Z23  Hepatitis B  Influenza   Pneumonia  Adults   Once  Once every  flu season  Two different vaccines separated by one year No    Next Annual Wellness Visit People with Medicare Every year  Today     [place] Women Who How Often Need  Date of Last Service Action  Mammogram  Z12.31 Women over 84 One baseline ages 26-39. Annually ager 66 yrs+ yes 2020 A referral has been placed to [facility] for a screening mammogram  Pap tests All women Annually if high risk. Every 2 yrs for normal risk women No    Screening for cervical cancer with   Pap (Z01.419 nl or Z01.411abnl) &  HPV Z11.51 Women aged 85 to 60 Once every 5 yrs     Screening pelvic and breast exams All women Annually if high risk. Every 2 yrs for normal risk women     Sexually Transmitted Diseases  Chlamydia  Gonorrhea  Syphilis All at risk adults Annually for  non pregnant females at increased risk         [place] Men Who How Ofter Need  Date of Last Service Action  Prostate Cancer - DRE & PSA Men over 50 Annually.  DRE might require a copay.     Sexually Transmitted Diseases  Syphilis All at risk adults Annually for men at increased risk            Fall Prevention in the Home, Adult Falls can cause injuries. They can happen to people of all ages. There are many things you can do to make your home safe and to help prevent falls. Ask for help when making these changes, if needed. What actions can I take to prevent falls? General Instructions  Use good lighting in all rooms. Replace any light bulbs that burn out.  Turn on the lights when you go into a dark area. Use night-lights.  Keep items that you use often in easy-to-reach places. Lower the shelves around your home if necessary.  Set up your furniture so you have a clear path. Avoid moving your furniture around.  Do not have throw rugs and other things on the floor that can make you trip.  Avoid walking on wet floors.  If any of your floors are uneven, fix them.  Add color or contrast paint or tape to clearly mark and help you see: ? Any grab bars or handrails. ? First and last steps of stairways. ? Where the edge of each step is.  If you use a stepladder: ? Make sure that it is fully opened. Do not climb a closed stepladder. ? Make sure that both sides of the stepladder are locked into place. ? Ask someone to hold the stepladder for you while you use it.  If there are any pets around you, be aware of where they are. What can I do in the bathroom?      Keep the floor dry. Clean up any water that spills onto the floor as soon as it happens.  Remove soap buildup in the tub or shower regularly.  Use non-skid mats or decals on the floor of the tub or shower.  Attach bath mats securely with double-sided, non-slip rug tape.  If you need to  sit down in the shower, use a plastic, non-slip stool.  Install grab bars by the toilet and in the tub and shower. Do not use towel bars as grab bars. What can I do in the bedroom?  Make sure that you have a light by your bed that  is easy to reach.  Do not use any sheets or blankets that are too big for your bed. They should not hang down onto the floor.  Have a firm chair that has side arms. You can use this for support while you get dressed. What can I do in the kitchen?  Clean up any spills right away.  If you need to reach something above you, use a strong step stool that has a grab bar.  Keep electrical cords out of the way.  Do not use floor polish or wax that makes floors slippery. If you must use wax, use non-skid floor wax. What can I do with my stairs?  Do not leave any items on the stairs.  Make sure that you have a light switch at the top of the stairs and the bottom of the stairs. If you do not have them, ask someone to add them for you.  Make sure that there are handrails on both sides of the stairs, and use them. Fix handrails that are broken or loose. Make sure that handrails are as long as the stairways.  Install non-slip stair treads on all stairs in your home.  Avoid having throw rugs at the top or bottom of the stairs. If you do have throw rugs, attach them to the floor with carpet tape.  Choose a carpet that does not hide the edge of the steps on the stairway.  Check any carpeting to make sure that it is firmly attached to the stairs. Fix any carpet that is loose or worn. What can I do on the outside of my home?  Use bright outdoor lighting.  Regularly fix the edges of walkways and driveways and fix any cracks.  Remove anything that might make you trip as you walk through a door, such as a raised step or threshold.  Trim any bushes or trees on the path to your home.  Regularly check to see if handrails are loose or broken. Make sure that both sides of  any steps have handrails.  Install guardrails along the edges of any raised decks and porches.  Clear walking paths of anything that might make someone trip, such as tools or rocks.  Have any leaves, snow, or ice cleared regularly.  Use sand or salt on walking paths during winter.  Clean up any spills in your garage right away. This includes grease or oil spills. What other actions can I take?  Wear shoes that: ? Have a low heel. Do not wear high heels. ? Have rubber bottoms. ? Are comfortable and fit you well. ? Are closed at the toe. Do not wear open-toe sandals.  Use tools that help you move around (mobility aids) if they are needed. These include: ? Canes. ? Walkers. ? Scooters. ? Crutches.  Review your medicines with your doctor. Some medicines can make you feel dizzy. This can increase your chance of falling. Ask your doctor what other things you can do to help prevent falls. Where to find more information  Centers for Disease Control and Prevention, STEADI: https://garcia.biz/  [employer] on Aging: BrainJudge.co.uk Contact a doctor if:  You are afraid of falling at home.  You feel weak, drowsy, or dizzy at home.  You fall at home. Summary  There are many simple things that you can do to make your home safe and to help prevent falls.  Ways to make your home safe include removing tripping hazards and installing grab bars in the bathroom.  Ask for help when making these changes in your home. This information is not intended to replace advice given to you by your health care provider. Make sure you discuss any questions you have with your health care provider. Document Revised: 11/13/2018 Document Reviewed: 03/07/2017 Elsevier Patient Education  2020 [place] Maintenance, Female Adopting a healthy lifestyle and getting preventive care are important in promoting health and wellness. Ask your health care provider about:  The right  schedule for you to have regular tests and exams.  Things you can do on your own to prevent diseases and keep yourself healthy. What should I know about diet, weight, and exercise? Eat a healthy diet   Eat a diet that includes plenty of vegetables, fruits, low-fat dairy products, and lean protein.  Do not eat a lot of foods that are high in solid fats, added sugars, or sodium. Maintain a healthy weight Body mass index (BMI) is used to identify weight problems. It estimates body fat based on height and weight. Your health care provider can help determine your BMI and help you achieve or maintain a healthy weight. Get regular exercise Get regular exercise. This is one of the most important things you can do for your health. Most adults should:  Exercise for at least 150 minutes each week. The exercise should increase your heart rate and make you sweat (moderate-intensity exercise).  Do strengthening exercises at least twice a week. This is in addition to the moderate-intensity exercise.  Spend less time sitting. Even light physical activity can be beneficial. Watch cholesterol and blood lipids Have your blood tested for lipids and cholesterol at 77 years of age, then have this test every 5 years. Have your cholesterol levels checked more often if:  Your lipid or cholesterol levels are high.  You are older than 76 years of age.  You are at high risk for heart disease. What should I know about cancer screening? Depending on your health history and family history, you may need to have cancer screening at various ages. This may include screening for:  Breast cancer.  Cervical cancer.  Colorectal cancer.  Skin cancer.  Lung cancer. What should I know about heart disease, diabetes, and high blood pressure? Blood pressure and heart disease  High blood pressure causes heart disease and increases the risk of stroke. This is more likely to develop in people who have high blood  pressure readings, are of African descent, or are overweight.  Have your blood pressure checked: ? Every 3-5 years if you are 12-57 years of age. ? Every year if you are 58 years old or older. Diabetes Have regular diabetes screenings. This checks your fasting blood sugar level. Have the screening done:  Once every three years after age 60 if you are at a normal weight and have a low risk for diabetes.  More often and at a younger age if you are overweight or have a high risk for diabetes. What should I know about preventing infection? Hepatitis B If you have a higher risk for hepatitis B, you should be screened for this virus. Talk with your health care provider to find out if you are at risk for hepatitis B infection. Hepatitis C Testing is recommended for:  Everyone born from [birth year] through 1965.  Anyone with known risk factors for hepatitis C. Sexually transmitted infections (STIs)  Get screened for STIs, including gonorrhea and chlamydia, if: ? You are sexually active and are younger than 77 years  of age. ? You are older than 77 years of age and your health care provider tells you that you are at risk for this type of infection. ? Your sexual activity has changed since you were last screened, and you are at increased risk for chlamydia or gonorrhea. Ask your health care provider if you are at risk.  Ask your health care provider about whether you are at high risk for HIV. Your health care provider may recommend a prescription medicine to help prevent HIV infection. If you choose to take medicine to prevent HIV, you should first get tested for HIV. You should then be tested every 3 months for as long as you are taking the medicine. Pregnancy  If you are about to stop having your period (premenopausal) and you may become pregnant, seek counseling before you get pregnant.  Take 400 to 800 micrograms (mcg) of folic acid every day if you become pregnant.  Ask for birth control  (contraception) if you want to prevent pregnancy. Osteoporosis and menopause Osteoporosis is a disease in which the bones lose minerals and strength with aging. This can result in bone fractures. If you are 63 years old or older, or if you are at risk for osteoporosis and fractures, ask your health care provider if you should:  Be screened for bone loss.  Take a calcium or vitamin D supplement to lower your risk of fractures.  Be given hormone replacement therapy (HRT) to treat symptoms of menopause. Follow these instructions at home: Lifestyle  Do not use any products that contain nicotine or tobacco, such as cigarettes, e-cigarettes, and chewing tobacco. If you need help quitting, ask your health care provider.  Do not use street drugs.  Do not share needles.  Ask your health care provider for help if you need support or information about quitting drugs. Alcohol use  Do not drink alcohol if: ? Your health care provider tells you not to drink. ? You are pregnant, may be pregnant, or are planning to become pregnant.  If you drink alcohol: ? Limit how much you use to 0-1 drink a day. ? Limit intake if you are breastfeeding.  Be aware of how much alcohol is in your drink. In the [place] one drink equals one 12 oz bottle of beer (355 mL), one 5 oz glass of wine (148 mL), or one 1 oz glass of hard liquor (44 mL). General instructions  Schedule regular health, dental, and eye exams.  Stay current with your vaccines.  Tell your health care provider if: ? You often feel depressed. ? You have ever been abused or do not feel safe at home. Summary  Adopting a healthy lifestyle and getting preventive care are important in promoting health and wellness.  Follow your health care provider's instructions about healthy diet, exercising, and getting tested or screened for diseases.  Follow your health care provider's instructions on monitoring your cholesterol and blood pressure. This  information is not intended to replace advice given to you by your health care provider. Make sure you discuss any questions you have with your health care provider. Document Revised: 07/16/2018 Document Reviewed: 07/16/2018 Elsevier Patient Education  2020 [employer].

## 2020-06-11 NOTE — Progress Notes (Signed)
I have reviewed the AWV visit.

## 2020-06-14 NOTE — Telephone Encounter (Signed)
Pt has been seen 

## 2020-06-21 ENCOUNTER — Other Ambulatory Visit: Payer: Self-pay

## 2020-06-21 DIAGNOSIS — I5032 Chronic diastolic (congestive) heart failure: Secondary | ICD-10-CM

## 2020-06-21 NOTE — Telephone Encounter (Signed)
furosemide (LASIX) 40 MG tablet(Expired, refill request @  [facility] [phone] - [place], [address] AT [address] Phone:  [phone]  Fax:  [phone]

## 2020-06-22 ENCOUNTER — Encounter: Payer: Self-pay | Admitting: *Deleted

## 2020-06-25 DIAGNOSIS — R531 Weakness: Secondary | ICD-10-CM | POA: Diagnosis not present

## 2020-06-25 DIAGNOSIS — I5033 Acute on chronic diastolic (congestive) heart failure: Secondary | ICD-10-CM | POA: Diagnosis not present

## 2020-06-25 DIAGNOSIS — R4182 Altered mental status, unspecified: Secondary | ICD-10-CM | POA: Diagnosis not present

## 2020-06-26 DIAGNOSIS — I509 Heart failure, unspecified: Secondary | ICD-10-CM | POA: Diagnosis not present

## 2020-07-01 ENCOUNTER — Telehealth: Payer: Self-pay | Admitting: Hematology

## 2020-07-01 NOTE — Telephone Encounter (Signed)
Scheduled per los, patient has been called and notified. 

## 2020-07-04 ENCOUNTER — Other Ambulatory Visit: Payer: Self-pay | Admitting: Internal Medicine

## 2020-07-04 ENCOUNTER — Inpatient Hospital Stay: Payer: PPO

## 2020-07-04 ENCOUNTER — Telehealth: Payer: PPO

## 2020-07-04 ENCOUNTER — Inpatient Hospital Stay: Payer: PPO | Attending: Hematology

## 2020-07-04 ENCOUNTER — Inpatient Hospital Stay (HOSPITAL_BASED_OUTPATIENT_CLINIC_OR_DEPARTMENT_OTHER): Payer: PPO | Admitting: Hematology

## 2020-07-04 ENCOUNTER — Other Ambulatory Visit: Payer: Self-pay | Admitting: *Deleted

## 2020-07-04 ENCOUNTER — Other Ambulatory Visit: Payer: Self-pay

## 2020-07-04 VITALS — BP 115/48 | HR 78 | Temp 96.9°F | Resp 18 | Ht 67.0 in | Wt 255.6 lb

## 2020-07-04 DIAGNOSIS — D649 Anemia, unspecified: Secondary | ICD-10-CM

## 2020-07-04 DIAGNOSIS — Z7982 Long term (current) use of aspirin: Secondary | ICD-10-CM | POA: Insufficient documentation

## 2020-07-04 DIAGNOSIS — D471 Chronic myeloproliferative disease: Secondary | ICD-10-CM | POA: Diagnosis not present

## 2020-07-04 DIAGNOSIS — D72829 Elevated white blood cell count, unspecified: Secondary | ICD-10-CM | POA: Insufficient documentation

## 2020-07-04 DIAGNOSIS — Z23 Encounter for immunization: Secondary | ICD-10-CM | POA: Insufficient documentation

## 2020-07-04 DIAGNOSIS — Z79899 Other long term (current) drug therapy: Secondary | ICD-10-CM | POA: Diagnosis not present

## 2020-07-04 DIAGNOSIS — I4892 Unspecified atrial flutter: Secondary | ICD-10-CM | POA: Insufficient documentation

## 2020-07-04 DIAGNOSIS — Z7901 Long term (current) use of anticoagulants: Secondary | ICD-10-CM | POA: Insufficient documentation

## 2020-07-04 LAB — CMP (CANCER CENTER ONLY)
ALT: 12 U/L (ref 0–44)
AST: 20 U/L (ref 15–41)
Albumin: 3.5 g/dL (ref 3.5–5.0)
Alkaline Phosphatase: 340 U/L — ABNORMAL HIGH (ref 38–126)
Anion gap: 11 (ref 5–15)
BUN: 33 mg/dL — ABNORMAL HIGH (ref 8–23)
CO2: 25 mmol/L (ref 22–32)
Calcium: 9.4 mg/dL (ref 8.9–10.3)
Chloride: 101 mmol/L (ref 98–111)
Creatinine: 2.36 mg/dL — ABNORMAL HIGH (ref 0.44–1.00)
GFR, Estimated: 21 mL/min — ABNORMAL LOW (ref >60)
Glucose, Bld: 84 mg/dL (ref 70–99)
Potassium: 4.1 mmol/L (ref 3.5–5.1)
Sodium: 137 mmol/L (ref 135–145)
Total Bilirubin: 0.4 mg/dL (ref 0.3–1.2)
Total Protein: 7.9 g/dL (ref 6.5–8.1)

## 2020-07-04 LAB — CBC WITH DIFFERENTIAL/PLATELET
Abs Immature Granulocytes: 0 10*3/uL (ref 0.00–0.07)
Band Neutrophils: 2 %
Basophils Absolute: 0.9 10*3/uL — ABNORMAL HIGH (ref 0.0–0.1)
Basophils Relative: 1 %
Eosinophils Absolute: 0.9 10*3/uL — ABNORMAL HIGH (ref 0.0–0.5)
Eosinophils Relative: 1 %
HCT: 23.7 % — ABNORMAL LOW (ref 36.0–46.0)
Hemoglobin: 7.5 g/dL — ABNORMAL LOW (ref 12.0–15.0)
Lymphocytes Relative: 2 %
Lymphs Abs: 1.8 10*3/uL (ref 0.7–4.0)
MCH: 28.7 pg (ref 26.0–34.0)
MCHC: 31.6 g/dL (ref 30.0–36.0)
MCV: 90.8 fL (ref 80.0–100.0)
Monocytes Absolute: 4.6 10*3/uL — ABNORMAL HIGH (ref 0.1–1.0)
Monocytes Relative: 5 %
Neutro Abs: 83.9 10*3/uL — ABNORMAL HIGH (ref 1.7–7.7)
Neutrophils Relative %: 89 %
Platelets: 153 10*3/uL (ref 150–400)
RBC: 2.61 MIL/uL — ABNORMAL LOW (ref 3.87–5.11)
RDW: 18.7 % — ABNORMAL HIGH (ref 11.5–15.5)
WBC: 92.2 10*3/uL (ref 4.0–10.5)
nRBC: 0 % (ref 0.0–0.2)

## 2020-07-04 LAB — RETICULOCYTES
Immature Retic Fract: 27.7 % — ABNORMAL HIGH (ref 2.3–15.9)
RBC.: 2.59 MIL/uL — ABNORMAL LOW (ref 3.87–5.11)
Retic Count, Absolute: 47.9 10*3/uL (ref 19.0–186.0)
Retic Ct Pct: 1.9 % (ref 0.4–3.1)

## 2020-07-04 LAB — SAMPLE TO BLOOD BANK

## 2020-07-04 LAB — PREPARE RBC (CROSSMATCH)

## 2020-07-04 MED ORDER — FUROSEMIDE 10 MG/ML IJ SOLN
INTRAMUSCULAR | Status: AC
  Filled 2020-07-04: qty 2

## 2020-07-04 MED ORDER — FUROSEMIDE 10 MG/ML IJ SOLN
20.0000 mg | Freq: Once | INTRAMUSCULAR | Status: AC
  Administered 2020-07-04: 20 mg via INTRAVENOUS

## 2020-07-04 MED ORDER — ACETAMINOPHEN 325 MG PO TABS
650.0000 mg | ORAL_TABLET | Freq: Once | ORAL | Status: AC
  Administered 2020-07-04: 650 mg via ORAL

## 2020-07-04 MED ORDER — METHYLPREDNISOLONE SODIUM SUCC 40 MG IJ SOLR
40.0000 mg | Freq: Once | INTRAMUSCULAR | Status: AC
  Administered 2020-07-04: 40 mg via INTRAVENOUS

## 2020-07-04 MED ORDER — ACETAMINOPHEN 325 MG PO TABS
ORAL_TABLET | ORAL | Status: AC
  Filled 2020-07-04: qty 2

## 2020-07-04 MED ORDER — METHYLPREDNISOLONE SODIUM SUCC 40 MG IJ SOLR
INTRAMUSCULAR | Status: AC
  Filled 2020-07-04: qty 1

## 2020-07-04 MED ORDER — SODIUM CHLORIDE 0.9% IV SOLUTION
250.0000 mL | Freq: Once | INTRAVENOUS | Status: AC
  Administered 2020-07-04: 250 mL via INTRAVENOUS
  Filled 2020-07-04: qty 250

## 2020-07-04 NOTE — Progress Notes (Signed)
CRITICAL VALUE STICKER  CRITICAL VALUE:hgb 7.5; wbc 92.2  RECEIVER (on-site recipient of call):Sandi K, RN  DATE & TIME NOTIFIED: 07/04/20; 9643  MESSENGER (representative from lab): Anette Guarneri  MD NOTIFIED: Dr.Kale  TIME OF NOTIFICATION: 1005  RESPONSE: Information acknowledged/orders for 1 unit PRBC

## 2020-07-04 NOTE — Progress Notes (Signed)
   Covid-19 Vaccination Clinic  Name:  Erika [name]    MRN: [id number] DOB: Jul 10, 1941  07/04/2020  Erika [name] was observed post Covid-19 immunization for 15 minutes without incident. She was provided with Vaccine Information Sheet and instruction to access the V-Safe system.   Erika [name] was instructed to call 911 with any severe reactions post vaccine: [name] Difficulty breathing  . Swelling of face and throat  . A fast heartbeat  . A bad rash all over body  . Dizziness and weakness   Immunizations Administered    Name Date Dose VIS Date Route   Pfizer COVID-19 Vaccine 07/04/2020 12:00 PM 0.3 mL 05/25/2020 Intramuscular   Manufacturer: [place]   Lot: [id number]   [place]: [id number]

## 2020-07-04 NOTE — Progress Notes (Signed)
HEMATOLOGY/ONCOLOGY CLINIC NOTE  Date of Service: 07/04/20    Inpatient Attending: .Brunetta Genera, MD  CC:  F/u MPN NOS chek2 mutation +ve  HPI  Patient is a very pleasant 79 year old female with a history of morbid obesity, anemia, asthma, hypertension, lumbar spinal TB in 2002 who presented with acute onset of lower back pain on Monday. She reports that she drank some pepsi and tea on that day and her back pain began shortly after. No initial injury to precipitate her back pain. It gradually worsened until she was seen in the ED on 05/30/17. Labs during admission revealed WBC count of 55.2k with predominantly neutrophilia of 48.8k and some monocytosis at 2.2k.   She had minimal anemia at 11.5 with an MCV of 92 and a normal platelet count of 163k.  At that time she had CT A/P while in the ED which was normal and without splenomegaly. Additionally, she also had MRI of the L-spine which only showed degenerative changes. She was subsequently admitted for further workup and pain control and to rule out a source of infection.   Of note, the patient has a remote prior h/o epidural mass in 2002 (hemilaminectomy and decompression of tumor in epidural space 2002) which was thought to be secondary to lumbar epidural tuberculosis. She was placed on a course of antibiotics for several months following her diagnosis.   Following her admission, we were consulted for further workup. No recent new medications. She reports that her inhaler does contain steroids, but otherwise she has not been on a course of steroids recently. She is UTD with her regular cancer screenings. She does not feel any different now than she did 12moto a year ago.  No focal symptoms suggestive of infection other than the back pain at this time.  On ROS, she denies fever, chills, night sweats, unexpected weight loss, abdominal pain, sore throat, joint swelling, or any other associated symptoms. Her back pain is much improved  with muscle relaxants. She does note some right great toe pain, but otherwise she has no other acute symptoms.   INTERVAL HISTORY:  Erika MELLOTTis here for follow up of persistent leucocytosis/neutrophilia consistent with chronic MPN NOS with Chek2 mutation. The patient's last visit with [facility] on 06/01/2020. The pt reports that she is doing well overall.  The pt reports that her fatigue has been stable. She has been eating well and denies any abnormal or excessive bleeding. Pt tolerated her first two COVID19 vaccines well and would like to receive the booster.   Lab results today (07/04/20) of CBC w/diff and CMP is as follows: all values are WNL except for WBC at 92.2K, RBC at 2.61, Hgb at 7.5, HCT at 23.7, RDW at 18.7, BUN at 33, Creatinine at 2.36, ALP at 340, GFR Est at 21. 07/04/2020 Retic Ct Pct at 1.9, Retic Ct Abs at 47.9, Immature Retic Fract at 27.7  On review of systems, pt denies lightheadedness, dizziness, worsening fatigue, abnormal/excessive bleeding, abdominal pain, fevers, chills, night sweats, new back pain and any other symptoms.    MEDICAL HISTORY:  Past Medical History:  Diagnosis Date  . Anemia    with menses  . Asthma   . [place]   . [place] pain    status post surgery 2002  . Breast cyst    Excesion with FNA, begnin in 2004.   . Degenerative joint disease of spine    Imaging 2005,  Degenerative hypertrophic facet arthritis  changes L4-5 and L5-S1..   . History of shingles    Recurrent with post herpetic neuralgia.   [name] Hypertension   . Lymphadenopathy    Of the mediastinum, Right side CXR 2008, not read on 2010 cxr.   . Menopause   . Obesity    BMI 54  . Psychosis ([place])    Secondary to prednisone.  . Shingles   . Stasis dermatitis    W/ LE edema, prviously on lasix now on mazxide.   . SVT (supraventricular tachycardia) [facility]) June 2009   one run while hospitalized  . Tuberculosis    active TB treated in 2002, hx of paraspinal  lumbar TB,     SURGICAL HISTORY: Past Surgical History:  Procedure Laterality Date  . BREAST CYST ASPIRATION Left   . Breast cyst biopsy    . CHOLECYSTECTOMY  02/02/2012   Procedure: LAPAROSCOPIC CHOLECYSTECTOMY;  Surgeon: Zenovia Jarred, MD;  Location: [place];  Service: General;  Laterality: N/A;  . Hemilaminectomy of L4, L5 and S1 decompression of tumor in epidural space  2000  . IR FLUORO GUIDED NEEDLE PLC ASPIRATION/INJECTION LOC  07/17/2019    SOCIAL HISTORY: Social History   Socioeconomic History  . Marital status: Widowed    Spouse name: Not on file  . Number of children: Not on file  . Years of education: 11 th grade  . Highest education level: Not on file  Occupational History  . 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 Smoking status: Never Smoker  . Smokeless tobacco: Never Used  Vaping Use  . Vaping Use: Never used  Substance and Sexual Activity  . Alcohol use: No    Alcohol/week: 0.0 standard drinks  . Drug use: No  . Sexual activity: Not Currently    Birth control/protection: Post-menopausal  Other Topics Concern  . Not on file  Social History Narrative   Current Social History 06/09/2020      Patient lives alone in a ground floor apartment which is 1 story. There are not steps up to the entrance the patient uses. One son stays with her once in a while.      Patient's method of transportation is personal car.      The highest level of education was some high school: 11th grade.      The patient currently retired.      Identified important Relationships are "My friend Mart Piggs, my nieces, my grandkids, a lot of people.       Pets : None       Interests / Fun: "Playing word games on my phone, watch TV, Talking on phone."       Current Stressors: "Nothing, really. I try not to let anything stress me."       Religious / Personal Beliefs: "I believe in God."       L. Ducatte, BSN,  RN-BC       Social Determinants of Health   Financial Resource Strain: Low Risk   . Difficulty of Paying Living Expenses: Not very hard  Food Insecurity: No Food Insecurity  . Worried About Charity fundraiser in the Last Year: Never true  . Ran Out of Food in the Last Year: Never true  Transportation Needs: Unmet Transportation Needs  . Lack of Transportation (Medical): Yes  . Lack of Transportation (Non-Medical): No  Physical Activity:   . Days of Exercise per Week: Not on  file  . Minutes of Exercise per Session: Not on file  Stress:   . Feeling of Stress : Not on file  Social Connections:   . Frequency of Communication with Friends and Family: Not on file  . Frequency of Social Gatherings with Friends and Family: Not on file  . Attends Religious Services: Not on file  . Active Member of Clubs or Organizations: Not on file  . Attends Archivist Meetings: Not on file  . Marital Status: Not on file  Intimate Partner Violence:   . Fear of Current or Ex-Partner: Not on file  . Emotionally Abused: Not on file  . Physically Abused: Not on file  . Sexually Abused: Not on file    FAMILY HISTORY: Family History  Problem Relation Age of Onset  . Stroke Mother        at young age  . Heart disease Mother   . Heart attack Father   . Heart disease Father   . Cancer Sister        Unknown  . Stroke Brother   . Stroke Brother   . Breast cancer Other   . Sickle cell anemia Brother   . Diabetes Brother   . Stroke Brother   . Stroke Son     ALLERGIES:  is allergic to prednisone and seroquel [quetiapine fumarate].  MEDICATIONS:  . Current Outpatient Medications:  .  ADVAIR DISKUS 500-50 MCG/DOSE AEPB, Inhale 1 puff into the lungs 2 (two) times daily., Disp: 180 each, Rfl: 3 .  albuterol (VENTOLIN HFA) 108 (90 Base) MCG/ACT inhaler, Inhale 1-2 puffs into the lungs every 6 (six) hours as needed for wheezing or shortness of breath., Disp: 18 g, Rfl: 11 .  allopurinol  (ZYLOPRIM) 300 MG tablet, Take 0.5 tablets (150 mg total) by mouth 2 (two) times daily., Disp: 90 tablet, Rfl: 3 .  amLODipine (NORVASC) 10 MG tablet, Take 0.5 tablets (5 mg total) by mouth daily., Disp: 90 tablet, Rfl: 3 .  apixaban (ELIQUIS) 5 MG TABS tablet, Take 1 tablet (5 mg total) by mouth 2 (two) times daily., Disp: 60 tablet, Rfl: 11 .  aspirin EC 81 MG tablet, Take 1 tablet (81 mg total) by mouth daily., Disp: 90 tablet, Rfl: 3 .  Ensure Max Protein (ENSURE MAX PROTEIN) LIQD, Take 330 mLs (11 oz total) by mouth 2 (two) times daily. (Patient taking differently: Take 11 oz by mouth 2 (two) times daily. She drinks regular Ensure not Ensure Max and she prefers chocolate), Disp:  , Rfl:  .  fluticasone (FLONASE) 50 MCG/ACT nasal spray, SHAKE LIQUID AND INSTILL ONE (1) SPRAY IN EACH NOSTRIL ONCE DAILY, Disp: 16 g, Rfl: 4 .  furosemide (LASIX) 40 MG tablet, Take 1 tablet (40 mg total) by mouth 2 (two) times daily for 5 days., Disp: 10 tablet, Rfl: 0 .  metoprolol tartrate (LOPRESSOR) 50 MG tablet, Take 1 tablet (50 mg total) by mouth 2 (two) times daily., Disp: 60 tablet, Rfl: 11 .  NYAMYC powder, APPLY TOPICALLY TWICE DAILY, Disp: 15 g, Rfl: 3 .  omeprazole (PRILOSEC) 20 MG capsule, Take 1 capsule (20 mg total) by mouth daily., Disp: 90 capsule, Rfl: 3 .  potassium chloride (KLOR-CON) 10 MEQ tablet, Take 2 tablets (20 mEq total) by mouth daily for 5 days., Disp: 10 tablet, Rfl: 0 .  potassium chloride (MICRO-K) 10 MEQ CR capsule, TAKE ONE CAPSULE BY MOUTH DAILY AS NEEDED, Disp: , Rfl:  .  pravastatin (PRAVACHOL) 20 MG tablet,  Take 1 tablet (20 mg total) by mouth daily., Disp: 90 tablet, Rfl: 3 .  QUEtiapine (SEROQUEL) 25 MG tablet, Take by mouth., Disp: , Rfl:  .  sodium chloride (OCEAN) 0.65 % SOLN nasal spray, Place 1 spray into both nostrils as needed for congestion. (Patient not taking: Reported on 06/09/2020), Disp: 88 mL, Rfl: 0   REVIEW OF SYSTEMS:   A 10+ POINT REVIEW OF SYSTEMS WAS  OBTAINED including neurology, dermatology, psychiatry, cardiac, respiratory, lymph, extremities, GI, GU, Musculoskeletal, constitutional, breasts, reproductive, HEENT.  All pertinent positives are noted in the HPI.  All others are negative.   PHYSICAL EXAMINATION:  Vitals:   07/04/20 1001  BP: (!) 115/48  Pulse: 78  Resp: 18  Temp: (!) 96.9 F (36.1 C)  TempSrc: Tympanic  SpO2: 100%  Weight: 255 lb 9.6 oz (115.9 kg)  Height: 5' 7" (1.702 m)  .  Exam was completed in a wheelchair   GENERAL:alert, in no acute distress and comfortable SKIN: no acute rashes, no significant lesions EYES: conjunctiva are pink and non-injected, sclera anicteric OROPHARYNX: MMM, no exudates, no oropharyngeal erythema or ulceration NECK: supple, no JVD LYMPH:  no palpable lymphadenopathy in the cervical, axillary or inguinal regions LUNGS: clear to auscultation b/l with normal respiratory effort HEART: regular rate & rhythm ABDOMEN:  normoactive bowel sounds , non tender, not distended. No palpable hepatosplenomegaly.  Extremity: no pedal edema PSYCH: alert & oriented x 3 with fluent speech NEURO: no focal motor/sensory deficits  LABORATORY DATA:  I have reviewed the data as listed   .       CBC Latest Ref Rng & Units 07/04/2020 06/01/2020 06/01/2020  WBC 4.0 - 10.5 K/uL 92.2(HH) 101.2(HH) -  Hemoglobin 12.0 - 15.0 g/dL 7.5(L) 6.5(LL) -  Hematocrit 36 - 46 % 23.7(L) 21.9(L) 21.0(L)  Platelets 150 - 400 K/uL 153 158 -   . CBC    Component Value Date/Time   WBC 92.2 (HH) 07/04/2020 0932   RBC 2.61 (L) 07/04/2020 0932   RBC 2.59 (L) 07/04/2020 0931   HGB 7.5 (L) 07/04/2020 0932   HGB 6.5 (LL) 06/01/2020 0922   HGB 11.3 (L) 07/24/2017 1402   HCT 23.7 (L) 07/04/2020 0932   HCT 21.9 (L) 06/01/2020 1037   HCT 36.0 07/24/2017 1402   PLT 153 07/04/2020 0932   PLT 158 06/01/2020 0922   PLT 172 07/24/2017 1402   MCV 90.8 07/04/2020 0932   MCV 93.3 07/24/2017 1402   MCH 28.7 07/04/2020  0932   MCHC 31.6 07/04/2020 0932   RDW 18.7 (H) 07/04/2020 0932   RDW 15.6 (H) 07/24/2017 1402   LYMPHSABS 1.8 07/04/2020 0932   LYMPHSABS 2.9 07/24/2017 1402   MONOABS 4.6 (H) 07/04/2020 0932   MONOABS 1.8 (H) 07/24/2017 1402   EOSABS 0.9 (H) 07/04/2020 0932   EOSABS 1.1 (H) 07/24/2017 1402   BASOSABS 0.9 (H) 07/04/2020 0932   BASOSABS 0.2 (H) 07/24/2017 1402   . CMP Latest Ref Rng & Units 07/04/2020 06/01/2020 05/27/2020  Glucose 70 - 99 mg/dL 84 91 64(L)  BUN 8 - 23 mg/dL 33(H) 32(H) 22  Creatinine 0.44 - 1.00 mg/dL 2.36(H) 1.74(H) 1.40(H)  Sodium 135 - 145 mmol/L 137 138 136  Potassium 3.5 - 5.1 mmol/L 4.1 4.3 4.2  Chloride 98 - 111 mmol/L 101 103 101  CO2 22 - 32 mmol/L _0 Calcium 8.9 - 10.3 mg/dL 9.4 9.5 9.4  Total Protein 6.5 - 8.1 g/dL 7.9 8.2(H) -  Total Bilirubin  0.3 - 1.2 mg/dL 0.4 0.4 -  Alkaline Phos 38 - 126 U/L 340(H) 377(H) -  AST 15 - 41 U/L 20 20 -  ALT 0 - 44 U/L 12 15 -    RADIOGRAPHIC STUDIES: I have personally reviewed the radiological images as listed and agreed with the findings in the report. No results found.  ASSESSMENT & PLAN:   Erika [name] is a delightful, well-appearing 79 y.o. woman with   #1 Leukocytosis?predominantly Neutrophilia with some monocytosis CMML vs MPN NOS with chek 2 mutation Patient initially presented with a WBC count of 55.2k with 48.8k neutrophils.  WBC counts relatively stable today.  Peripheral blood smear personally previously reviewed-shows significant neutrophilia with left shift.  Increased bands with a few myelocytes and metamyelocytes.  Pelgeroid neutrophils.  No significant increased blasts.  Monocytosis present.  CT abdomen done for workup of her back pain did not show any acute intra-abdominal or pelvic findings. He does not have any overt splenomegaly on her CT abdomen -which is somewhat argues against a chronic myeloproliferative disorder though does not rule this out.  MRI of the lumbar spine  shows old surgical and degenerative changes but no evidence of tumor or infectious collections.  Leucocytosis - improved at 34.3k today. No fevers/chills or other constitutional symptoms  Foundation One heme with Chek 2 mutation. No other findings noted.  CT chest on 05/31/2017: IMPRESSION: 1. Mild ground-glass opacities suggest mild pulmonary edema. Small bilateral pleural effusions and basilar atelectasis. No pulmonary infiltrate. Mild subcarinal mediastinal adenopathy is likely reactive. Cardiomegaly.  JAK2 and BCR-ABL mutations neg. LDH levels were within normal limits and did not suggest a high-grade lymphoma.  06/24/17 BM bx- this showed hypercellular bone marrow with granulocytic proliferation. Cytogenetic analysis was normal.   #2 severe normocytic anemia  #3 CHF/moderate aortic stenosis/paroxysmal atrial flutter on chronic anticoagulation.  #4 bilateral lower extremity swelling left more than right. Ultrasound negative for DVT on 10/22. Cannot rule out venous reflux issues.  PLAN:  -Discussed pt labwork today, 07/04/20; WBC is holding steady, PLT are nml, Hgb is low, ALP is stable, kidney numbers are up slightly.  -Discussed 06/01/2020 MMP shows no M Protein, K/L light chain ratio at 0.07 -Discussed CDC guidelines regarding the [place] booster. Will give in clinic today. -Continue f/u with Cardiology & PCP -Will give 1 unit of PRBC today  -Will see back in 2 months with labs & 1 unit of PRBC -Pt advised to contact if excessive fatigue or lightheadedness/fatigue in the interim.    FOLLOW UP: Return to clinic with Dr. Irene Limbo with labs and appointment for 1 unit of PRBCs in 2 months   The total time spent in the appt was 20 minutes and more than 50% was on counseling and direct patient cares.  All of the patient's questions were answered with apparent satisfaction. The patient knows to call the clinic with any problems, questions or concerns.   Sullivan Lone MD [place] AAHIVMS [facility]  [facility] Hematology/Oncology Physician [facility]  (Office):       [phone] (Work cell):  [phone] (Fax):           [phone]   I, Yevette Edwards, am acting as a scribe for Dr. Sullivan Lone.   .I have reviewed the above documentation for accuracy and completeness, and I agree with the above. Brunetta Genera MD

## 2020-07-04 NOTE — Patient Instructions (Signed)

## 2020-07-05 LAB — TYPE AND SCREEN
ABO/RH(D): O POS
Antibody Screen: NEGATIVE
Unit division: 0

## 2020-07-05 LAB — BPAM RBC
Blood Product Expiration Date: 202112312359
ISSUE DATE / TIME: 202111291231
Unit Type and Rh: 5100

## 2020-07-06 ENCOUNTER — Other Ambulatory Visit: Payer: Self-pay | Admitting: Internal Medicine

## 2020-07-08 ENCOUNTER — Telehealth: Payer: Self-pay | Admitting: *Deleted

## 2020-07-08 ENCOUNTER — Telehealth: Payer: PPO

## 2020-07-08 NOTE — Telephone Encounter (Signed)
  Chronic Care Management   Outreach Note  07/08/2020 Name: Erika [name] MRN: [id number] DOB: Jun 29, 1941  Referred by: [name], MD Reason for referral : Chronic Care Management (HF, HTN, AFib, Asthma, myeloproliferative neoplasm)   Patient called clinic yesterday and left message requesting return call in response to previous messages left by this CCM RN. An unsuccessful telephone outreach was attempted today. The patient was referred to the case management team for assistance with care management and care coordination.   Follow Up Plan: A HIPAA compliant phone message was left for the patient providing contact information and requesting a return call.  The care management team will reach out to the patient again over the next 7-14 days.   Kelli Churn RN, CCM, [facility] RN Care Manager [phone]

## 2020-07-14 ENCOUNTER — Telehealth: Payer: PPO

## 2020-07-14 ENCOUNTER — Ambulatory Visit: Payer: PPO | Admitting: *Deleted

## 2020-07-14 DIAGNOSIS — I480 Paroxysmal atrial fibrillation: Secondary | ICD-10-CM

## 2020-07-14 DIAGNOSIS — I10 Essential (primary) hypertension: Secondary | ICD-10-CM

## 2020-07-14 DIAGNOSIS — J4540 Moderate persistent asthma, uncomplicated: Secondary | ICD-10-CM

## 2020-07-14 DIAGNOSIS — I5032 Chronic diastolic (congestive) heart failure: Secondary | ICD-10-CM

## 2020-07-14 NOTE — Progress Notes (Signed)
Internal Medicine Clinic Resident  I have personally reviewed this encounter including the documentation in this note and/or discussed this patient with the care management provider. I will address any urgent items identified by the care management provider and will communicate my actions to the patient's PCP. I have reviewed the patient's CCM visit with my supervising attending, Dr Evette Doffing.  Foy [place], MD 07/14/2020

## 2020-07-14 NOTE — Patient Instructions (Signed)
Visit Information  Goals Addressed              This Visit's Progress     Patient Stated   .  "I feel so much better now that I've completed home health physical therapy and can drive again. " (pt-stated)        CARE PLAN ENTRY (see longitudinal plan of care for additional care plan information)   Current Barriers:  [name] Knowledge deficit related to basic heart failure pathophysiology and self care management- successful outreach to patient to complete follow up assessment, patient states her son was killed in a car accident earlier this week when he was hit head on but she is doing OK, she says [facility] sees her once monthly, she also says she saw her oncologist on 11/29  and received 1 unit of PRBC because her Hgb was low, she denies dyspnea or overall energy levels different from her baseline and says " I've dealt with low iron for many years so I don't really notice when my Hgb gets low", she says she does not elevate her legs when sitting as she does not have a recliner. She continues to voice good understanding of her treatment plan for HF and, HTN a fib and asthma, she says she continues to weigh herself daily and record her weights in the [facility] calendar this CCM RN mailed to her. She says she continues to drink chocolate Ensure and would appreciate any samples she can get.  Case Manager Clinical Goal(s):  [name] Over the next 30 - 60 days, patient will weigh self daily and record- met . Over the next 30 - 60 days, patient will verbalize understanding of Heart Failure Action Plan and when to call doctor- met . Over the next 30 - 60 days, patient will take all Heart Failure mediations as prescribed- not met patient taking metoprolol only daily not twice daily and is taking 1/2 of table, not a whole tablet . Over the next 30 - 60 days, patient will weigh daily and record (notifying MD of 3 lb weight gain over night or 5 lb in a week)-   Interventions:   . Reviewed Heart Failure, asthma, a fib and HTN  treatment plans  . Reviewed medications with patient and discussed medication taking behavior . Discussed plans with patient for ongoing care management follow up and provided patient with direct contact information for care management team . Reviewed weight gain parameters indicating need for  provider notification  . Reviewed role of diuretics in prevention of fluid overload and management of heart failure . Provided positive reinforcement for patient's excellent disease self management skills . Reviewed scheduled/upcoming provider appointments  . 07/14/20- Suggested patient's son [name] pick up Chocolate Ensure samples from this CCM RN's office since he is not working this week due to the death of his brother  Patient Self Care Activities:  . Takes Heart Failure Medications as prescribed . Weighs daily and record (notifying MD of 3 lb weight gain over night or 5 lb in a week) . Verbalizes understanding of and follows CHF Action Plan . Adheres to low sodium diet, heart healthy diet  Please see past updates related to this goal by clicking on the "Past Updates" button in the selected goal         The patient verbalized understanding of instructions, educational materials, and care plan provided today and declined offer to receive copy of patient instructions, educational materials, and care plan.  The care management team will reach out to the patient again over the next 30-60 days.   Kelli Churn RN, CCM, [facility] RN Care Manager [phone]

## 2020-07-14 NOTE — Chronic Care Management (AMB) (Signed)
Chronic Care Management   Follow Up Note   07/14/2020 Name: Erika [name] MRN: [id number] DOB: 18-Feb-1941  Referred by: [name], MD Reason for referral : Chronic Care Management (HF, HTN, AFib, Asthma, myeloproliferative neoplasm)   Erika [name] is a 79 y.o. year old female who is a primary care patient of [name], MD. The CCM team was consulted for assistance with chronic disease management and care coordination needs.    Review of patient status, including review of consultants reports, relevant laboratory and other test results, and collaboration with appropriate care team members and the patient's provider was performed as part of comprehensive patient evaluation and provision of chronic care management services.    SDOH (Social Determinants of Health) assessments performed: No See Care Plan activities for detailed interventions related to [facility])     Outpatient Encounter Medications as of 07/14/2020  Medication Sig Note  . ADVAIR DISKUS 500-50 MCG/DOSE AEPB Inhale 1 puff into the lungs 2 (two) times daily.   [name] albuterol (VENTOLIN HFA) 108 (90 Base) MCG/ACT inhaler Inhale 1-2 puffs into the lungs every 6 (six) hours as needed for wheezing or shortness of breath. 12/01/2019: States she uses once daily not prn- clarified purpose and use of rescue inhaler  . allopurinol (ZYLOPRIM) 300 MG tablet Take 0.5 tablets (150 mg total) by mouth 2 (two) times daily.   [name] amLODipine (NORVASC) 10 MG tablet Take 0.5 tablets (5 mg total) by mouth daily.   [name] apixaban (ELIQUIS) 5 MG TABS tablet Take 1 tablet (5 mg total) by mouth 2 (two) times daily.   [name] aspirin EC 81 MG tablet Take 1 tablet (81 mg total) by mouth daily.   . Ensure Max Protein (ENSURE MAX PROTEIN) LIQD Take 330 mLs (11 oz total) by mouth 2 (two) times daily. (Patient taking differently: Take 11 oz by mouth 2 (two) times daily. She drinks regular Ensure not Ensure Max and she prefers chocolate) 12/01/2019: Will contact [employer]  rep for coupons and/or free samples  . fluticasone (FLONASE) 50 MCG/ACT nasal spray SHAKE LIQUID AND INSTILL ONE (1) SPRAY IN EACH NOSTRIL ONCE DAILY   . furosemide (LASIX) 40 MG tablet Take 1 tablet (40 mg total) by mouth 2 (two) times daily for 5 days.   . metoprolol tartrate (LOPRESSOR) 50 MG tablet Take 1 tablet (50 mg total) by mouth 2 (two) times daily. 12/01/2019: Says she is taking 1/2 of a 25 mg tablet once daily- will clarify dosage with provider  . NYAMYC powder APPLY TOPICALLY TWICE DAILY   . omeprazole (PRILOSEC) 20 MG capsule Take 1 capsule (20 mg total) by mouth daily.   . potassium chloride SA (KLOR-CON) 20 MEQ tablet TAKE 1/2 TABLET BY MOUTH DAILY   . pravastatin (PRAVACHOL) 20 MG tablet Take 1 tablet (20 mg total) by mouth daily.   . QUEtiapine (SEROQUEL) 25 MG tablet Take by mouth.   . sodium chloride (OCEAN) 0.65 % SOLN nasal spray Place 1 spray into both nostrils as needed for congestion. (Patient not taking: Reported on 06/09/2020)    No facility-administered encounter medications on file as of 07/14/2020.     Objective:  Wt Readings from Last 3 Encounters:  07/04/20 255 lb 9.6 oz (115.9 kg)  06/01/20 262 lb 4.8 oz (119 kg)  05/27/20 265 lb (120.2 kg)   BP Readings from Last 3 Encounters:  07/04/20 116/82  07/04/20 (!) 115/48  06/01/20 (!) 141/57    Goals Addressed  This Visit's Progress     Patient Stated   .  "I feel so much better now that I've completed home health physical therapy and can drive again. " (pt-stated)        CARE PLAN ENTRY (see longitudinal plan of care for additional care plan information)   Current Barriers:  [name] Knowledge deficit related to basic heart failure pathophysiology and self care management- successful outreach to patient to complete follow up assessment, patient states her son was killed in a car accident earlier this week when he was hit head on but she is doing OK, she says [facility] sees her once monthly,  she also says she saw her oncologist on 11/29  and received 1 unit of PRBC because her Hgb was low, she denies dyspnea or overall energy levels different from her baseline and says " I've dealt with low iron for many years so I don't really notice when my Hgb gets low", she says she does not elevate her legs when sitting as she does not have a recliner. She continues to voice good understanding of her treatment plan for HF and, HTN a fib and asthma, she says she continues to weigh herself daily and record her weights in the [facility] calendar this CCM RN mailed to her. She says she continues to drink chocolate Ensure and would appreciate any samples she can get.  Case Manager Clinical Goal(s):  [name] Over the next 30 - 60 days, patient will weigh self daily and record- met . Over the next 30 - 60 days, patient will verbalize understanding of Heart Failure Action Plan and when to call doctor- met . Over the next 30 - 60 days, patient will take all Heart Failure mediations as prescribed- not met patient taking metoprolol only daily not twice daily and is taking 1/2 of table, not a whole tablet . Over the next 30 - 60 days, patient will weigh daily and record (notifying MD of 3 lb weight gain over night or 5 lb in a week)-   Interventions:  . Reviewed Heart Failure, asthma, a fib and HTN  treatment plans  . Reviewed medications with patient and discussed medication taking behavior . Discussed plans with patient for ongoing care management follow up and provided patient with direct contact information for care management team . Reviewed weight gain parameters indicating need for  provider notification  . Reviewed role of diuretics in prevention of fluid overload and management of heart failure . Provided positive reinforcement for patient's excellent disease self management skills . Reviewed scheduled/upcoming provider appointments  . 07/14/20- Suggested patient's son [name] pick  up Chocolate Ensure samples from this CCM RN's office since he is not working this week due to the death of his brother  Patient Self Care Activities:  . Takes Heart Failure, HTN, A fib, and asthma  medications as prescribed . Weighs daily and record (notifying MD of 3 lb weight gain over night or 5 lb in a week) . Verbalizes understanding of and follows CHF Action Plan . Adheres to low sodium diet, heart healthy diet  Please see past updates related to this goal by clicking on the "Past Updates" button in the selected goal           Plan:   The care management team will reach out to the patient again over the next 30-60 days.    Kelli Churn RN, CCM, [facility] RN Care Manager [phone]

## 2020-07-15 NOTE — Progress Notes (Signed)
Internal Medicine Clinic Attending  CCM services provided by the care management provider and their documentation were discussed with Dr.  Wynetta Emery . We reviewed the pertinent findings, urgent action items addressed by the resident and non-urgent items to be addressed by the PCP.  I agree with the assessment, diagnosis, and plan of care documented in the CCM and resident's note.  Axel Filler, MD 07/15/2020

## 2020-07-25 ENCOUNTER — Ambulatory Visit: Payer: PPO | Admitting: *Deleted

## 2020-07-25 DIAGNOSIS — I5033 Acute on chronic diastolic (congestive) heart failure: Secondary | ICD-10-CM | POA: Diagnosis not present

## 2020-07-25 DIAGNOSIS — I10 Essential (primary) hypertension: Secondary | ICD-10-CM

## 2020-07-25 DIAGNOSIS — I5032 Chronic diastolic (congestive) heart failure: Secondary | ICD-10-CM

## 2020-07-25 DIAGNOSIS — R531 Weakness: Secondary | ICD-10-CM | POA: Diagnosis not present

## 2020-07-25 DIAGNOSIS — R4182 Altered mental status, unspecified: Secondary | ICD-10-CM | POA: Diagnosis not present

## 2020-07-25 DIAGNOSIS — J4540 Moderate persistent asthma, uncomplicated: Secondary | ICD-10-CM

## 2020-07-25 DIAGNOSIS — I480 Paroxysmal atrial fibrillation: Secondary | ICD-10-CM

## 2020-07-25 NOTE — Chronic Care Management (AMB) (Signed)
  Chronic Care Management   Note  07/25/2020 Name: Erika [name] MRN: [id number] DOB: Jul 20, 1941  Received call from patient's son Erika [name] stating he is at [facility] to pick up patient's chocolate Ensure samples.  Met Erika [name] in [place] parking lot and gave him 24 eight oz chocolate Ensure samples for patient.  Follow up plan: Telephone follow up appointment with care management team member scheduled for:08/12/20.  Kelli Churn RN, CCM, [facility] RN Care Manager [phone]

## 2020-07-26 DIAGNOSIS — I509 Heart failure, unspecified: Secondary | ICD-10-CM | POA: Diagnosis not present

## 2020-07-27 ENCOUNTER — Telehealth: Payer: Self-pay

## 2020-07-27 ENCOUNTER — Ambulatory Visit: Payer: PPO | Admitting: *Deleted

## 2020-07-27 DIAGNOSIS — I480 Paroxysmal atrial fibrillation: Secondary | ICD-10-CM

## 2020-07-27 DIAGNOSIS — I10 Essential (primary) hypertension: Secondary | ICD-10-CM

## 2020-07-27 DIAGNOSIS — I5032 Chronic diastolic (congestive) heart failure: Secondary | ICD-10-CM

## 2020-07-27 DIAGNOSIS — J4540 Moderate persistent asthma, uncomplicated: Secondary | ICD-10-CM

## 2020-07-27 NOTE — Chronic Care Management (AMB) (Signed)
  Chronic Care Management   Note  07/27/2020 Name: Erika [name] MRN: [id number] DOB: 12/11/1940  Per request from Internal Medicine Clinical Director Yvonna Alanis, successful outreach to patient to follow up on referral made to [place] on 06/09/20 to discuss colonoscopy. Patient says she will call today and schedule appointment. Chart reviewed and patient has appointment scheduled for 09/12/20 with Dr Tarri Glenn at 9:10 am.   Follow up plan: Telephone follow up appointment with care management team member scheduled for:08/12/20  Kelli Churn RN, CCM, [facility] RN Care Manager [phone]

## 2020-07-27 NOTE — Telephone Encounter (Signed)
pls contact pt regarding medicine [phone]

## 2020-07-27 NOTE — Telephone Encounter (Signed)
Pt asking about her K+, informed of pharmacy it was sent to and she will call them

## 2020-07-28 NOTE — Progress Notes (Signed)
Internal Medicine Clinic Attending  Case discussed with Dr. Agyei .  We reviewed the AWV findings.  I agree with the assessment, diagnosis, and plan of care documented in the AWV note.     

## 2020-07-28 NOTE — Progress Notes (Signed)
Internal Medicine Clinic Resident  I have personally reviewed this encounter including the documentation in this note and/or discussed this patient with the care management provider. I will address any urgent items identified by the care management provider and will communicate my actions to the patient's PCP. I have reviewed the patient's CCM visit with my supervising attending, Dr Jimmye Norman.  Jean Rosenthal, MD 07/28/2020

## 2020-08-10 ENCOUNTER — Other Ambulatory Visit: Payer: Self-pay | Admitting: Internal Medicine

## 2020-08-12 ENCOUNTER — Ambulatory Visit: Payer: PPO | Admitting: *Deleted

## 2020-08-12 DIAGNOSIS — I5032 Chronic diastolic (congestive) heart failure: Secondary | ICD-10-CM

## 2020-08-12 DIAGNOSIS — D471 Chronic myeloproliferative disease: Secondary | ICD-10-CM

## 2020-08-12 DIAGNOSIS — I480 Paroxysmal atrial fibrillation: Secondary | ICD-10-CM

## 2020-08-12 DIAGNOSIS — I10 Essential (primary) hypertension: Secondary | ICD-10-CM

## 2020-08-12 NOTE — Patient Instructions (Signed)
Visit Information It was nice speaking with you today.  Patient Care Plan: CCM RN- Heart Failure (Adult)    Problem Identified: Symptom Exacerbation (Heart Failure)   Priority: High  Onset Date: 10/13/2019    Long-Range Goal: Symptom Exacerbation Prevented or Minimized   Start Date: 10/13/2019  This Visit's Progress: On track  Priority: High  Note:   ARE PLAN ENTRY (see longitudinal plan of care for additional care plan information)   Current Barriers:  [name] Knowledge deficit related to basic heart failure pathophysiology and self care management- successful outreach to patient to complete follow up assessment, patient states she is dealing OK with the lost of her son who was killed in a car accident on 07/08/20, she says [facility] sees her once monthly and were out this week, she says she will see her oncologist on 1/28, she denies dyspnea or overall energy levels different from her baseline and says " I've dealt with low iron for many years so I don't really notice when my Hgb gets low", she says she does not elevate her legs when sitting as she does not have a recliner. She denies heart palpitations or heart racing, she continues to voice good understanding of her treatment plan for HF and, HTN a fib, she says she continues to weigh herself daily and record her weights in the [facility] calendar this CCM RN mailed to her. She says she continues to drink chocolate Ensure and would appreciate any samples she can get.  Case Manager Clinical Goal(s):  [name] Over the next 30 - 60 days, patient will weigh self daily and record- met . Over the next 30 - 60 days, patient will verbalize understanding of Heart Failure Action Plan and when to call doctor- met . Over the next 30 - 60 days, patient will take all Heart Failure mediations as prescribed- not met patient taking metoprolol only daily not twice daily and is taking 1/2 of table, not a whole tablet . Over the next 30 -  60 days, patient will weigh daily and record (notifying MD of 3 lb weight gain over night or 5 lb in a week)-   Interventions:  . Reviewed Heart Failure a fib and HTN  treatment plans  . Assessed frequency of [facility] home visits and reviewed latest faxed notes . Reviewed medications with patient and discussed medication taking behavior . Discussed plans with patient for ongoing care management follow up and provided patient with direct contact information for care management team . Reviewed weight gain parameters indicating need for  provider notification  . Reviewed role of diuretics in prevention of fluid overload and management of heart failure . Provided positive reinforcement for patient's excellent disease self management skills . Reviewed scheduled/upcoming provider appointments  . Will plan to deliver chocolate Ensure to patient at her appointment with Dr Irene Limbo on 09/02/20  Patient Self Care Activities:  . Takes Heart Failure Medications as prescribed . Weighs daily and record (notifying MD of 3 lb weight gain over night or 5 lb in a week) . Verbalizes understanding of and follows CHF Action Plan . Adheres to low sodium diet, heart healthy diet . - follows rescue plan if symptoms flare-up . - knows when to call the doctor . - tracks symptoms and what helps feel better or worse . - dresses right for the weather, hot or cold    Patient Care Plan: CCM RN Asthma (Adult)    Problem Identified: Symptom Exacerbation (Asthma)  Priority: High  Onset Date: 08/12/2020    Long-Range Goal: Symptom Exacerbation Prevented or Minimized   Start Date: 08/12/2020  This Visit's Progress: On track  Priority: High  Note:   CARE PLAN ENTRY (see longitudinal plan of care for additional care plan information)  Current Barriers:   [name] Knowledge deficits related to basic asthma self care/management- patient denies respiratory issues at present    Case Manager Clinical Goal(s):  Over the next  30 days, patient will not be hospitalized for asthma exacerbation    Interventions:    Review definition of asthma- airways are more sensitive, common sxs- cough,chest tightness, SOB, wheezing,low energy  Review Sxs of asthma flare- coughing especially at night, out of breath more easily, wheezing,chest tightness, fast breathing at rest  Review sxs of severe flare- severe difficulty breathing, too short of breath to speak a full sentence or walk across a room, lips or fingers turning blue, feeling like you are going to pass o  Assessed patient's knowledge of asthma triggers and educated patient on how to avoid triggers- I.e.- wear mask when dusting,sweeping or doing yard work, use pump instead of aerosol bottles, don't use bleach or ammonia, use exhaust fans when cooking, set car air conditioning to recirculate so less pollution gets in  Assessed patient's knowledge of asthma medications and ability to distinguish between rescue medications and controller medications and provided appropriate education, advise to carry rescue meds at all times  Controlling triggers-  get early treatment for colds or URIs, wear scarf around your face in cold weather  Teach patient that asthma is not in good control if using rescue inhaler more than twice weekly or if wake up at night with sxs more than 2 times per month  Treat GERD since can cause coughing, increase airway inflammation, and trigger flare ups  Lose weight- excess weight puts stress on airways  Reinforce the need for taking controlled medications as indicated    Provided instruction about proper use of medications used for management of asthma including inhalers  Discuss the importance of pneumonia and flu vaccine  Reinforce what to do in an asthma attack  Patient Self Care Activities:  . Patient verbalizes understanding of plan to mange asthma to prevent hosptialization . Self administers medications as prescribed . Attends all  scheduled provider appointments . Calls pharmacy for medication refills . Calls provider office for new concerns or questions        The patient verbalized understanding of instructions, educational materials, and care plan provided today and declined offer to receive copy of patient instructions, educational materials, and care plan.   The care management team will reach out to the patient again over the next 30 days.   Kelli Churn RN, CCM, [facility] RN Care Manager [phone]

## 2020-08-12 NOTE — Progress Notes (Signed)
Internal Medicine Clinic Resident  I have personally reviewed this encounter including the documentation in this note and/or discussed this patient with the care management provider. I will address any urgent items identified by the care management provider and will communicate my actions to the patient's PCP. I have reviewed the patient's CCM visit with my supervising attending, Dr Jimmye Norman. Delice Bison, DO 08/12/2020

## 2020-08-12 NOTE — Chronic Care Management (AMB) (Signed)
Chronic Care Management   Follow Up Note   08/12/2020 Name: Erika [name] MRN: [id number] DOB: 11/11/1940  Referred by: Erika Falcon, MD Reason for referral : Chronic Care Management (HF, HTN, AFib, Asthma, myeloproliferative neoplasm)   Erika [name] is a 80 y.o. year old female who is a primary care patient of Erika Falcon, MD. The CCM team was consulted for assistance with chronic disease management and care coordination needs.    Review of patient status, including review of consultants reports, relevant laboratory and other test results, and collaboration with appropriate care team members and the patient's provider was performed as part of comprehensive patient evaluation and provision of chronic care management services.    SDOH (Social Determinants of Health) assessments performed: No See Care Plan activities for detailed interventions related to Erika [name])     Outpatient Encounter Medications as of 08/12/2020  Medication Sig Note  . furosemide (LASIX) 20 MG tablet TAKE 1 TABLET(20 MG) BY MOUTH DAILY AS NEEDED FOR EDEMA   . ADVAIR DISKUS 500-50 MCG/DOSE AEPB Inhale 1 puff into the lungs 2 (two) times daily.   [name] albuterol (VENTOLIN HFA) 108 (90 Base) MCG/ACT inhaler Inhale 1-2 puffs into the lungs every 6 (six) hours as needed for wheezing or shortness of breath. 12/01/2019: States she uses once daily not prn- clarified purpose and use of rescue inhaler  . allopurinol (ZYLOPRIM) 300 MG tablet Take 0.5 tablets (150 mg total) by mouth 2 (two) times daily.   [name] amLODipine (NORVASC) 10 MG tablet Take 0.5 tablets (5 mg total) by mouth daily.   [name] apixaban (ELIQUIS) 5 MG TABS tablet Take 1 tablet (5 mg total) by mouth 2 (two) times daily.   [name] aspirin EC 81 MG tablet Take 1 tablet (81 mg total) by mouth daily.   . Ensure Max Protein (ENSURE MAX PROTEIN) LIQD Take 330 mLs (11 oz total) by mouth 2 (two) times daily. (Patient taking differently: Take 11 oz by mouth 2 (two) times daily. She  drinks regular Ensure not Ensure Max and she prefers chocolate) 12/01/2019: Will contact [employer] rep for coupons and/or free samples  . fluticasone (FLONASE) 50 MCG/ACT nasal spray SHAKE LIQUID AND INSTILL ONE (1) SPRAY IN EACH NOSTRIL ONCE DAILY   . metoprolol tartrate (LOPRESSOR) 50 MG tablet Take 1 tablet (50 mg total) by mouth 2 (two) times daily. 12/01/2019: Says she is taking 1/2 of a 25 mg tablet once daily- will clarify dosage with provider  . NYAMYC powder APPLY TOPICALLY TWICE DAILY   . omeprazole (PRILOSEC) 20 MG capsule Take 1 capsule (20 mg total) by mouth daily.   . potassium chloride SA (KLOR-CON) 20 MEQ tablet TAKE 1/2 TABLET BY MOUTH DAILY   . pravastatin (PRAVACHOL) 20 MG tablet Take 1 tablet (20 mg total) by mouth daily.   . QUEtiapine (SEROQUEL) 25 MG tablet Take by mouth.   . sodium chloride (OCEAN) 0.65 % SOLN nasal spray Place 1 spray into both nostrils as needed for congestion. (Patient not taking: Reported on 06/09/2020)    No facility-administered encounter medications on file as of 08/12/2020.     Objective:  Wt Readings from Last 3 Encounters:  07/04/20 255 lb 9.6 oz (115.9 kg)  06/01/20 262 lb 4.8 oz (119 kg)  05/27/20 265 lb (120.2 kg)    Goals Addressed              This Visit's Progress     Patient Stated   .  COMPLETED: "I  feel so much better now that I've completed home health physical therapy and can drive again. " (pt-stated)        CARE PLAN ENTRY (see longitudinal plan of care for additional care plan information)   Current Barriers:  [name] Knowledge deficit related to basic heart failure pathophysiology and self care management- successful outreach to patient to complete follow up assessment, patient states her son was killed in a car accident earlier this week when he was hit head on but she is doing OK, she says [facility] sees her once monthly, she also says she saw her oncologist on 11/29  and received 1 unit of PRBC because her Hgb was low, she  denies dyspnea or overall energy levels different from her baseline and says " I've dealt with low iron for many years so I don't really notice when my Hgb gets low", she says she does not elevate her legs when sitting as she does not have a recliner. She continues to voice good understanding of her treatment plan for HF and, HTN a fib and asthma, she says she continues to weigh herself daily and record her weights in the [name] this CCM RN mailed to her. She says she continues to drink chocolate Ensure and would appreciate any samples she can get.  Case Manager Clinical Goal(s):  [name] Over the next 30 - 60 days, patient will weigh self daily and record- met . Over the next 30 - 60 days, patient will verbalize understanding of Heart Failure Action Plan and when to call doctor- met . Over the next 30 - 60 days, patient will take all Heart Failure mediations as prescribed- not met patient taking metoprolol only daily not twice daily and is taking 1/2 of table, not a whole tablet . Over the next 30 - 60 days, patient will weigh daily and record (notifying MD of 3 lb weight gain over night or 5 lb in a week)-   Interventions:  . Reviewed Heart Failure, asthma, a fib and HTN  treatment plans  . Reviewed medications with patient and discussed medication taking behavior . Discussed plans with patient for ongoing care management follow up and provided patient with direct contact information for care management team . Reviewed weight gain parameters indicating need for  provider notification  . Reviewed role of diuretics in prevention of fluid overload and management of heart failure . Provided positive reinforcement for patient's excellent disease self management skills . Reviewed scheduled/upcoming provider appointments  . Will deliver chocolate Ensure to patient at her appointment with Dr Erika [name] on 07/04/20 . 07/14/20- Suggested patient's son Erika [name] pick up Chocolate  Ensure samples from this CCM RN's office since he is not working this week due to the death of his brother  Patient Self Care Activities:  . Takes Heart Failure Medications as prescribed . Weighs daily and record (notifying MD of 3 lb weight gain over night or 5 lb in a week) . Verbalizes understanding of and follows CHF Action Plan . Adheres to low sodium diet, heart healthy diet  Please see past updated [place]      Other   .  Track and Manage My Symptoms-Asthma        Timeframe:  Long-Range Goal Priority:  High Start Date:        08/12/20                     Expected End Date:  Follow Up Date 09/12/20   - avoid symptom triggers outdoors - eliminate symptom triggers at home - follow asthma action plan - keep follow-up appointments - keep rescue medicines on hand    Why is this important?    Keeping track of asthma symptoms can tell you a lot about your asthma control.   Based on symptoms and peak flow results you can see how well you are doing.   Your asthma action plan has a green, yellow and red zone. Green means all is good; it is your goal. Yellow means your symptoms are a little worse. You will need to adjust your medications. Being in the red zone means that your    asthma is out of control. You will need to use your rescue medicines. You may need emergency care.     Notes:     .  Track and Manage Symptoms-Heart Failure        Timeframe:  Long-Range Goal Priority:  High Start Date:         08/12/20                    Expected End Date:                       Follow Up Date 09/12/20   - follow rescue plan if symptoms flare-up - know when to call the doctor - track symptoms and what helps feel better or worse - dress right for the weather, hot or cold    Why is this important?    You will be able to handle your symptoms better if you keep track of them.   Making some simple changes to your lifestyle will help.   Eating healthy is  one thing you can do to take good care of yourself.    Notes:        Patient Care Plan: CCM RN- Heart Failure (Adult)    Problem Identified: Symptom Exacerbation (Heart Failure)   Priority: High  Onset Date: 10/13/2019    Long-Range Goal: Symptom Exacerbation Prevented or Minimized   Start Date: 10/13/2019  This Visit's Progress: On track  Priority: High  Note:   ARE PLAN ENTRY (see longitudinal plan of care for additional care plan information)   Current Barriers:  [name] Knowledge deficit related to basic heart failure pathophysiology and self care management- successful outreach to patient to complete follow up assessment, patient states she is dealing OK with the lost of her son who was killed in a car accident on 07/08/20, she says [facility] sees her once monthly and were out this week, she says she will see her oncologist on 1/28, she denies dyspnea or overall energy levels different from her baseline and says " I've dealt with low iron for many years so I don't really notice when my Hgb gets low", she says she does not elevate her legs when sitting as she does not have a recliner. She denies heart palpitations or heart racing, she continues to voice good understanding of her treatment plan for HF and, HTN a fib, she says she continues to weigh herself daily and record her weights in the [place] [facility] calendar this CCM RN mailed to her. She says she continues to drink chocolate Ensure and would appreciate any samples she can get.  Case Manager Clinical Goal(s):  [name] Over the next 30 - 60 days, patient will weigh self daily and record- met .  Over the next 30 - 60 days, patient will verbalize understanding of Heart Failure Action Plan and when to call doctor- met . Over the next 30 - 60 days, patient will take all Heart Failure mediations as prescribed- not met patient taking metoprolol only daily not twice daily and is taking 1/2 of table, not a whole tablet . Over  the next 30 - 60 days, patient will weigh daily and record (notifying MD of 3 lb weight gain over night or 5 lb in a week)-   Interventions:  . Reviewed Heart Failure a fib and HTN  treatment plans  . Assessed frequency of [facility] home visits and reviewed latest faxed notes . Reviewed medications with patient and discussed medication taking behavior . Discussed plans with patient for ongoing care management follow up and provided patient with direct contact information for care management team . Reviewed weight gain parameters indicating need for  provider notification  . Reviewed role of diuretics in prevention of fluid overload and management of heart failure . Provided positive reinforcement for patient's excellent disease self management skills . Reviewed scheduled/upcoming provider appointments  . Will plan to deliver chocolate Ensure to patient at her appointment with Dr Erika [name] on 09/02/20  Patient Self Care Activities:  . Takes Heart Failure Medications as prescribed . Weighs daily and record (notifying MD of 3 lb weight gain over night or 5 lb in a week) . Verbalizes understanding of and follows CHF Action Plan . Adheres to low sodium diet, heart healthy diet . - follows rescue plan if symptoms flare-up . - knows when to call the doctor . - tracks symptoms and what helps feel better or worse . - dresses right for the weather, hot or cold    Patient Care Plan: CCM RN Asthma (Adult)    Problem Identified: Symptom Exacerbation (Asthma)   Priority: High  Onset Date: 08/12/2020    Long-Range Goal: Symptom Exacerbation Prevented or Minimized   Start Date: 08/12/2020  This Visit's Progress: On track  Priority: High  Note:   CARE PLAN ENTRY (see longitudinal plan of care for additional care plan information)  Current Barriers:   [name] Knowledge deficits related to basic asthma self care/management- patient denies respiratory issues at present    Case Manager Clinical  Goal(s):  Over the next 30 days, patient will not be hospitalized for asthma exacerbation    Interventions:    Review definition of asthma- airways are more sensitive, common sxs- cough,chest tightness, SOB, wheezing,low energy  Review Sxs of asthma flare- coughing especially at night, out of breath more easily, wheezing,chest tightness, fast breathing at rest  Review sxs of severe flare- severe difficulty breathing, too short of breath to speak a full sentence or walk across a room, lips or fingers turning blue, feeling like you are going to pass o  Assessed patient's knowledge of asthma triggers and educated patient on how to avoid triggers- I.e.- wear mask when dusting,sweeping or doing yard work, use pump instead of aerosol bottles, don't use bleach or ammonia, use exhaust fans when cooking, set car air conditioning to recirculate so less pollution gets in  Assessed patient's knowledge of asthma medications and ability to distinguish between rescue medications and controller medications and provided appropriate education, advise to carry rescue meds at all times  Controlling triggers-  get early treatment for colds or URIs, wear scarf around your face in cold weather  Teach patient that asthma is not in good control if using rescue inhaler  more than twice weekly or if wake up at night with sxs more than 2 times per month  Treat GERD since can cause coughing, increase airway inflammation, and trigger flare ups  Lose weight- excess weight puts stress on airways  Reinforce the need for taking controlled medications as indicated  Provided instruction about proper use of medications used for management of asthma including inhalers  Discuss the importance of pneumonia and flu vaccine  Reinforce what to do in an asthma attack  Patient Self Care Activities:  . Patient verbalizes understanding of plan to mange asthma to prevent hosptialization . Self administers medications as  prescribed . Attends all scheduled provider appointments . Calls pharmacy for medication refills . Calls provider office for new concerns or questions        Plan:   The care management team will reach out to the patient again over the next 30 days.    SIGNATURE

## 2020-08-15 NOTE — Progress Notes (Signed)
Pt encounter discussed with resident Dr. Koleen Distance and I agree with the documentation of this AWV.

## 2020-08-26 DIAGNOSIS — I509 Heart failure, unspecified: Secondary | ICD-10-CM | POA: Diagnosis not present

## 2020-08-31 ENCOUNTER — Other Ambulatory Visit: Payer: Self-pay | Admitting: Internal Medicine

## 2020-08-31 DIAGNOSIS — J4530 Mild persistent asthma, uncomplicated: Secondary | ICD-10-CM

## 2020-09-01 NOTE — Progress Notes (Signed)
HEMATOLOGY/ONCOLOGY CLINIC NOTE  Date of Service: 09/01/20    Inpatient Attending: .Brunetta Genera, MD  CC:  F/u MPN NOS chek2 mutation +ve  HPI  Patient is a very pleasant 80 year old female with a history of morbid obesity, anemia, asthma, hypertension, lumbar spinal TB in 2002 who presented with acute onset of lower back pain on Monday. She reports that she drank some pepsi and tea on that day and her back pain began shortly after. No initial injury to precipitate her back pain. It gradually worsened until she was seen in the ED on 05/30/17. Labs during admission revealed WBC count of 55.2k with predominantly neutrophilia of 48.8k and some monocytosis at 2.2k.   She had minimal anemia at 11.5 with an MCV of 92 and a normal platelet count of 163k.  At that time she had CT A/P while in the ED which was normal and without splenomegaly. Additionally, she also had MRI of the L-spine which only showed degenerative changes. She was subsequently admitted for further workup and pain control and to rule out a source of infection.   Of note, the patient has a remote prior h/o epidural mass in 2002 (hemilaminectomy and decompression of tumor in epidural space 2002) which was thought to be secondary to lumbar epidural tuberculosis. She was placed on a course of antibiotics for several months following her diagnosis.   Following her admission, we were consulted for further workup. No recent new medications. She reports that her inhaler does contain steroids, but otherwise she has not been on a course of steroids recently. She is UTD with her regular cancer screenings. She does not feel any different now than she did 44moto a year ago.  No focal symptoms suggestive of infection other than the back pain at this time.  On ROS, she denies fever, chills, night sweats, unexpected weight loss, abdominal pain, sore throat, joint swelling, or any other associated symptoms. Her back pain is much improved  with muscle relaxants. She does note some right great toe pain, but otherwise she has no other acute symptoms.   INTERVAL HISTORY:   Erika LABELLAis here for follow up of persistent leucocytosis/neutrophilia consistent with chronic MPN NOS with Chek2 mutation. The patient's last visit with [facility] on 07/04/2020. The pt reports that she is doing well overall.  The pt reports no new symptoms or concerns. She notes some recent personal trauma with the loss of her son in a car accident. The pt notes a recent change in sleep schedule, but denies any related acute fatigue or symptoms. The pt notes that she has been drinking her Ensure and maintaining her daily food intake.   Lab results today 09/02/2020 of CBC w/diff and CMP is as follows: all values are WNL except for WBC of 92.9K, RBC of 2.63, Hgb of 7.9, HCT of 24.9, RDW of 19.6, Plt of 133K.  Chemistries pending.  On review of systems, pt denies bleeding or infection issues, acute fatigue, lightheadedness, dizziness, SOB, syncope, back pain, spinal pain, abdominal pain/bloating, fevers, chills, night sweats, decreased appetite, leg swelling and any other symptoms.   MEDICAL HISTORY:  Past Medical History:  Diagnosis Date  . Anemia    with menses  . Asthma   . Atrial flutter, paroxysmal ([place]   . Back pain    status post surgery 2002  . Breast cyst    Excesion with FNA, begnin in 2004.   . Degenerative joint disease of spine  Imaging 2005,  Degenerative hypertrophic facet arthritis changes L4-5 and L5-S1..   . History of shingles    Recurrent with post herpetic neuralgia.   [name] Hypertension   . Lymphadenopathy    Of the mediastinum, Right side CXR 2008, not read on 2010 cxr.   . Menopause   . Obesity    BMI 54  . Psychosis ([place])    Secondary to prednisone.  . Shingles   . Stasis dermatitis    W/ LE edema, prviously on lasix now on mazxide.   . SVT (supraventricular tachycardia) [facility]) June 2009   one run while hospitalized  .  Tuberculosis    active TB treated in 2002, hx of paraspinal lumbar TB,     SURGICAL HISTORY: Past Surgical History:  Procedure Laterality Date  . BREAST CYST ASPIRATION Left   . Breast cyst biopsy    . CHOLECYSTECTOMY  02/02/2012   Procedure: LAPAROSCOPIC CHOLECYSTECTOMY;  Surgeon: Zenovia Jarred, MD;  Location: [facility];  Service: General;  Laterality: N/A;  . Hemilaminectomy of L4, L5 and S1 decompression of tumor in epidural space  2000  . IR FLUORO GUIDED NEEDLE PLC ASPIRATION/INJECTION LOC  07/17/2019    SOCIAL HISTORY: Social History   Socioeconomic History  . Marital status: Widowed    Spouse name: Not on file  . Number of children: Not on file  . Years of education: 11 th grade  . Highest education level: Not on file  Occupational History  . 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 Smoking status: Never Smoker  . Smokeless tobacco: Never Used  Vaping Use  . Vaping Use: Never used  Substance and Sexual Activity  . Alcohol use: No    Alcohol/week: 0.0 standard drinks  . Drug use: No  . Sexual activity: Not Currently    Birth control/protection: Post-menopausal  Other Topics Concern  . Not on file  Social History Narrative   Current Social History 06/09/2020      Patient lives alone in a ground floor apartment which is 1 story. There are not steps up to the entrance the patient uses. One son stays with her once in a while.      Patient's method of transportation is personal car.      The highest level of education was some high school: 11th grade.      The patient currently retired.      Identified important Relationships are "My friend Mart Piggs, my nieces, my grandkids, a lot of people.       Pets : None       Interests / Fun: "Playing word games on my phone, watch TV, Talking on phone."       Current Stressors: "Nothing, really. I try not to let anything stress me."       Religious /  Personal Beliefs: "I believe in God."       L. Ducatte, BSN, RN-BC       Social Determinants of Health   Financial Resource Strain: Low Risk   . Difficulty of Paying Living Expenses: Not very hard  Food Insecurity: No Food Insecurity  . Worried About Charity fundraiser in the Last Year: Never true  . Ran Out of Food in the Last Year: Never true  Transportation Needs: Unmet Transportation Needs  . Lack of Transportation (Medical): Yes  . Lack of Transportation (Non-Medical): No  Physical Activity: Not on file  Stress: Not on file  Social Connections: Not on file  Intimate Partner Violence: Not on file    FAMILY HISTORY: Family History  Problem Relation Age of Onset  . Stroke Mother        at young age  . Heart disease Mother   . Heart attack Father   . Heart disease Father   . Cancer Sister        Unknown  . Stroke Brother   . Stroke Brother   . Breast cancer Other   . Sickle cell anemia Brother   . Diabetes Brother   . Stroke Brother   . Stroke Son     ALLERGIES:  is allergic to prednisone and seroquel [quetiapine fumarate].  MEDICATIONS:  . Current Outpatient Medications:  .  furosemide (LASIX) 20 MG tablet, TAKE 1 TABLET(20 MG) BY MOUTH DAILY AS NEEDED FOR EDEMA, Disp: 30 tablet, Rfl: 6 .  ADVAIR DISKUS 500-50 MCG/DOSE AEPB, Inhale 1 puff into the lungs 2 (two) times daily., Disp: 180 each, Rfl: 3 .  albuterol (VENTOLIN HFA) 108 (90 Base) MCG/ACT inhaler, INHALE 1-2 PUFFS INTO THE LUNGS EVERY 6 HOURS AS NEEDED FOR WHEEZING OR FOR SHORTNESS OF BREATH, Disp: 8.5 g, Rfl: 11 .  allopurinol (ZYLOPRIM) 300 MG tablet, Take 0.5 tablets (150 mg total) by mouth 2 (two) times daily., Disp: 90 tablet, Rfl: 3 .  amLODipine (NORVASC) 10 MG tablet, Take 0.5 tablets (5 mg total) by mouth daily., Disp: 90 tablet, Rfl: 3 .  apixaban (ELIQUIS) 5 MG TABS tablet, Take 1 tablet (5 mg total) by mouth 2 (two) times daily., Disp: 60 tablet, Rfl: 11 .  aspirin EC 81 MG tablet, Take 1  tablet (81 mg total) by mouth daily., Disp: 90 tablet, Rfl: 3 .  Ensure Max Protein (ENSURE MAX PROTEIN) LIQD, Take 330 mLs (11 oz total) by mouth 2 (two) times daily. (Patient taking differently: Take 11 oz by mouth 2 (two) times daily. She drinks regular Ensure not Ensure Max and she prefers chocolate), Disp:  , Rfl:  .  fluticasone (FLONASE) 50 MCG/ACT nasal spray, SHAKE LIQUID AND INSTILL ONE (1) SPRAY IN EACH NOSTRIL ONCE DAILY, Disp: 16 g, Rfl: 4 .  metoprolol tartrate (LOPRESSOR) 50 MG tablet, TAKE 1 TABLET BY MOUTH TWICE DAILY, Disp: 60 tablet, Rfl: 11 .  NYAMYC powder, APPLY TOPICALLY TO AFFECTED AREAS TWICE DAILY, Disp: 15 g, Rfl: 3 .  omeprazole (PRILOSEC) 20 MG capsule, Take 1 capsule (20 mg total) by mouth daily., Disp: 90 capsule, Rfl: 3 .  potassium chloride SA (KLOR-CON) 20 MEQ tablet, TAKE 1/2 TABLET BY MOUTH DAILY, Disp: 15 tablet, Rfl: 1 .  pravastatin (PRAVACHOL) 20 MG tablet, Take 1 tablet (20 mg total) by mouth daily., Disp: 90 tablet, Rfl: 3 .  QUEtiapine (SEROQUEL) 25 MG tablet, Take by mouth., Disp: , Rfl:  .  sodium chloride (OCEAN) 0.65 % SOLN nasal spray, Place 1 spray into both nostrils as needed for congestion. (Patient not taking: Reported on 06/09/2020), Disp: 88 mL, Rfl: 0   REVIEW OF SYSTEMS:   10 Point review of Systems was done is negative except as noted above.   PHYSICAL EXAMINATION:  Vitals:   09/02/20 0907  BP: 114/60  Pulse: 78  Resp: 20  Temp: 97.8 F (36.6 C)  TempSrc: Tympanic  SpO2: 98%  Weight: 256 lb 8 oz (116.3 kg)  Height: _0  (1.702 m)    Exam was given in a wheelchair.  GENERAL:alert, in no acute distress  and comfortable SKIN: no acute rashes, no significant lesions EYES: conjunctiva are pink and non-injected, sclera anicteric OROPHARYNX: MMM, no exudates, no oropharyngeal erythema or ulceration NECK: supple, no JVD LYMPH:  no palpable lymphadenopathy in the cervical, axillary or inguinal regions. Spleen just palpable. LUNGS:  clear to auscultation b/l with normal respiratory effort HEART: regular rate & rhythm ABDOMEN:  normoactive bowel sounds , non tender, not distended. Extremity: no pedal edema PSYCH: alert & oriented x 3 with fluent speech NEURO: no focal motor/sensory deficits  LABORATORY DATA:  I have reviewed the data as listed   .       CBC Latest Ref Rng & Units 07/04/2020 06/01/2020 06/01/2020  WBC 4.0 - 10.5 K/uL 92.2(HH) 101.2(HH) -  Hemoglobin 12.0 - 15.0 g/dL 7.5(L) 6.5(LL) -  Hematocrit 36.0 - 46.0 % 23.7(L) 21.9(L) 21.0(L)  Platelets 150 - 400 K/uL 153 158 -   . CBC    Component Value Date/Time   WBC 92.2 (HH) 07/04/2020 0932   RBC 2.61 (L) 07/04/2020 0932   RBC 2.59 (L) 07/04/2020 0931   HGB 7.5 (L) 07/04/2020 0932   HGB 6.5 (LL) 06/01/2020 0922   HGB 11.3 (L) 07/24/2017 1402   HCT 23.7 (L) 07/04/2020 0932   HCT 21.9 (L) 06/01/2020 1037   HCT 36.0 07/24/2017 1402   PLT 153 07/04/2020 0932   PLT 158 06/01/2020 0922   PLT 172 07/24/2017 1402   MCV 90.8 07/04/2020 0932   MCV 93.3 07/24/2017 1402   MCH 28.7 07/04/2020 0932   MCHC 31.6 07/04/2020 0932   RDW 18.7 (H) 07/04/2020 0932   RDW 15.6 (H) 07/24/2017 1402   LYMPHSABS 1.8 07/04/2020 0932   LYMPHSABS 2.9 07/24/2017 1402   MONOABS 4.6 (H) 07/04/2020 0932   MONOABS 1.8 (H) 07/24/2017 1402   EOSABS 0.9 (H) 07/04/2020 0932   EOSABS 1.1 (H) 07/24/2017 1402   BASOSABS 0.9 (H) 07/04/2020 0932   BASOSABS 0.2 (H) 07/24/2017 1402   . CMP Latest Ref Rng & Units 07/04/2020 06/01/2020 05/27/2020  Glucose 70 - 99 mg/dL 84 91 64(L)  BUN 8 - 23 mg/dL 33(H) 32(H) 22  Creatinine 0.44 - 1.00 mg/dL 2.36(H) 1.74(H) 1.40(H)  Sodium 135 - 145 mmol/L 137 138 136  Potassium 3.5 - 5.1 mmol/L 4.1 4.3 4.2  Chloride 98 - 111 mmol/L 101 103 101  CO2 22 - 32 mmol/L _0 Calcium 8.9 - 10.3 mg/dL 9.4 9.5 9.4  Total Protein 6.5 - 8.1 g/dL 7.9 8.2(H) -  Total Bilirubin 0.3 - 1.2 mg/dL 0.4 0.4 -  Alkaline Phos 38 - 126 U/L 340(H)  377(H) -  AST 15 - 41 U/L 20 20 -  ALT 0 - 44 U/L 12 15 -    RADIOGRAPHIC STUDIES: I have personally reviewed the radiological images as listed and agreed with the findings in the report. No results found.  ASSESSMENT & PLAN:   GEORGETTE HELMER is a delightful, well-appearing 80 y.o. woman with   #1 Leukocytosis?predominantly Neutrophilia with some monocytosis CMML vs MPN NOS with chek 2 mutation Patient initially presented with a WBC count of 55.2k with 48.8k neutrophils.  WBC counts relatively stable today.  Peripheral blood smear personally previously reviewed-shows significant neutrophilia with left shift.  Increased bands with a few myelocytes and metamyelocytes.  Pelgeroid neutrophils.  No significant increased blasts.  Monocytosis present.  CT abdomen done for workup of her back pain did not show any acute intra-abdominal or pelvic findings. He does not have any  overt splenomegaly on her CT abdomen -which is somewhat argues against a chronic myeloproliferative disorder though does not rule this out.  MRI of the lumbar spine shows old surgical and degenerative changes but no evidence of tumor or infectious collections.  Leucocytosis - improved at 34.3k today. No fevers/chills or other constitutional symptoms  Foundation One heme with Chek 2 mutation. No other findings noted.  CT chest on 05/31/2017: IMPRESSION: 1. Mild ground-glass opacities suggest mild pulmonary edema. Small bilateral pleural effusions and basilar atelectasis. No pulmonary infiltrate. Mild subcarinal mediastinal adenopathy is likely reactive. Cardiomegaly.  JAK2 and BCR-ABL mutations neg. LDH levels were within normal limits and did not suggest a high-grade lymphoma.  06/24/17 BM bx- this showed hypercellular bone marrow with granulocytic proliferation. Cytogenetic analysis was normal.   #2 severe normocytic anemia  #3 CHF/moderate aortic stenosis/paroxysmal atrial flutter on chronic  anticoagulation.  #4 bilateral lower extremity swelling left more than right. Ultrasound negative for DVT on 10/22. Cannot rule out venous reflux issues.  PLAN:  -Discussed pt labwork today, 09/02/2020; Hgb low but holding steady, WBC high but stable, Chemistries stable -Continue daily Vitamin B Complex. -Recommended pt continue to lean on family and faith during difficult times. The pt notes she is doing as best as possible and does not need referall to counselor at this time. -Advised pt that we will hold off on transfusion for now due to stable levels. Contact immediately if fatigue increases.  -wpould transfuse prbc for hgb<7 or if symptomatic -Will see back in 4 months with labs. -Will get PRBC transfusion in 2 months with labs.   FOLLOW UP: Please schedule for labs and 1 unit of PRBC transfusion in 2 months Please schedule for labs 1 unit of PRBC transfusion and MD visit in 4 months.    The total time spent in the appointment was 20 minutes and more than 50% was on counseling and direct patient cares.  All of the patient's questions were answered with apparent satisfaction. The patient knows to call the clinic with any problems, questions or concerns.   Sullivan Lone MD [place] AAHIVMS [facility] Hematology/Oncology Physician [facility]  (Office):       [phone] (Work cell):  [phone] (Fax):           [phone]   I, Reinaldo Raddle, am acting as scribe for Dr. Sullivan Lone, MD.    .I have reviewed the above documentation for accuracy and completeness, and I agree with the above. Brunetta Genera MD

## 2020-09-02 ENCOUNTER — Inpatient Hospital Stay: Payer: PPO | Attending: Hematology

## 2020-09-02 ENCOUNTER — Other Ambulatory Visit: Payer: Self-pay

## 2020-09-02 ENCOUNTER — Ambulatory Visit: Payer: PPO | Admitting: *Deleted

## 2020-09-02 ENCOUNTER — Inpatient Hospital Stay: Payer: PPO

## 2020-09-02 ENCOUNTER — Inpatient Hospital Stay: Payer: PPO | Admitting: Hematology

## 2020-09-02 ENCOUNTER — Telehealth: Payer: PPO

## 2020-09-02 VITALS — BP 114/60 | HR 78 | Temp 97.8°F | Resp 20 | Ht 67.0 in | Wt 256.5 lb

## 2020-09-02 DIAGNOSIS — Z79899 Other long term (current) drug therapy: Secondary | ICD-10-CM | POA: Diagnosis not present

## 2020-09-02 DIAGNOSIS — Z7901 Long term (current) use of anticoagulants: Secondary | ICD-10-CM | POA: Insufficient documentation

## 2020-09-02 DIAGNOSIS — D471 Chronic myeloproliferative disease: Secondary | ICD-10-CM

## 2020-09-02 DIAGNOSIS — I480 Paroxysmal atrial fibrillation: Secondary | ICD-10-CM

## 2020-09-02 DIAGNOSIS — D649 Anemia, unspecified: Secondary | ICD-10-CM

## 2020-09-02 DIAGNOSIS — I5032 Chronic diastolic (congestive) heart failure: Secondary | ICD-10-CM

## 2020-09-02 DIAGNOSIS — D72829 Elevated white blood cell count, unspecified: Secondary | ICD-10-CM | POA: Insufficient documentation

## 2020-09-02 DIAGNOSIS — J4540 Moderate persistent asthma, uncomplicated: Secondary | ICD-10-CM

## 2020-09-02 DIAGNOSIS — I4892 Unspecified atrial flutter: Secondary | ICD-10-CM | POA: Diagnosis not present

## 2020-09-02 DIAGNOSIS — I10 Essential (primary) hypertension: Secondary | ICD-10-CM

## 2020-09-02 LAB — CBC WITH DIFFERENTIAL/PLATELET
Abs Immature Granulocytes: 7.23 10*3/uL — ABNORMAL HIGH (ref 0.00–0.07)
Basophils Absolute: 0.1 10*3/uL (ref 0.0–0.1)
Basophils Relative: 0 %
Eosinophils Absolute: 0.6 10*3/uL — ABNORMAL HIGH (ref 0.0–0.5)
Eosinophils Relative: 1 %
HCT: 24.9 % — ABNORMAL LOW (ref 36.0–46.0)
Hemoglobin: 7.9 g/dL — ABNORMAL LOW (ref 12.0–15.0)
Immature Granulocytes: 8 %
Lymphocytes Relative: 3 %
Lymphs Abs: 3.2 10*3/uL (ref 0.7–4.0)
MCH: 30 pg (ref 26.0–34.0)
MCHC: 31.7 g/dL (ref 30.0–36.0)
MCV: 94.7 fL (ref 80.0–100.0)
Monocytes Absolute: 2.6 10*3/uL — ABNORMAL HIGH (ref 0.1–1.0)
Monocytes Relative: 3 %
Neutro Abs: 79.3 10*3/uL — ABNORMAL HIGH (ref 1.7–7.7)
Neutrophils Relative %: 85 %
Platelets: 133 10*3/uL — ABNORMAL LOW (ref 150–400)
RBC: 2.63 MIL/uL — ABNORMAL LOW (ref 3.87–5.11)
RDW: 19.6 % — ABNORMAL HIGH (ref 11.5–15.5)
WBC: 92.9 10*3/uL (ref 4.0–10.5)
nRBC: 0 % (ref 0.0–0.2)

## 2020-09-02 LAB — CMP (CANCER CENTER ONLY)
ALT: 15 U/L (ref 0–44)
AST: 27 U/L (ref 15–41)
Albumin: 3.5 g/dL (ref 3.5–5.0)
Alkaline Phosphatase: 430 U/L — ABNORMAL HIGH (ref 38–126)
Anion gap: 7 (ref 5–15)
BUN: 23 mg/dL (ref 8–23)
CO2: 27 mmol/L (ref 22–32)
Calcium: 8.9 mg/dL (ref 8.9–10.3)
Chloride: 104 mmol/L (ref 98–111)
Creatinine: 1.38 mg/dL — ABNORMAL HIGH (ref 0.44–1.00)
GFR, Estimated: 39 mL/min — ABNORMAL LOW (ref >60)
Glucose, Bld: 87 mg/dL (ref 70–99)
Potassium: 4.3 mmol/L (ref 3.5–5.1)
Sodium: 138 mmol/L (ref 135–145)
Total Bilirubin: 0.4 mg/dL (ref 0.3–1.2)
Total Protein: 7.7 g/dL (ref 6.5–8.1)

## 2020-09-02 LAB — SAMPLE TO BLOOD BANK

## 2020-09-02 NOTE — Progress Notes (Signed)
CRITICAL VALUE STICKER  CRITICAL VALUE: WBC 92.9  RECEIVER (on-site recipient of call): Leanne Chang  DATE & TIME NOTIFIED: 09/02/20  MESSENGER (representative from lab): Lelan Pons  MD NOTIFIED: Dr. Irene Limbo  TIME OF NOTIFICATION:  RESPONSE: aware

## 2020-09-02 NOTE — Chronic Care Management (AMB) (Signed)
Chronic Care Management   CCM RN Visit Note  09/02/2020 Name: Erika [name] MRN: [id number] DOB: March 01, 1941  Subjective: Erika [name] is a 80 y.o. year old female who is a primary care patient of Erika Falcon, MD. The care management team was consulted for assistance with disease management and care coordination.  Consent to Services:  The patient was given information about Chronic Care Management services, agreed to services, and gave verbal consent prior to initiation of services.  Please see initial visit note for detailed documentation.   Patient agreed to services and verbal consent obtained.   Assessment: Review of patient past medical history, allergies, medications, health status, including review of consultants reports, laboratory and other test data, was performed as part of comprehensive evaluation and provision of chronic care management services.   SDOH (Social Determinants of Health) assessments and interventions performed:    CCM Care Plan  Allergies  Allergen Reactions  . Prednisone Other (See Comments)    REACTION: Psychosis, talking out of head, insomnia  . Seroquel [Quetiapine Fumarate] Nausea Only and Other (See Comments)    Caused very dry mouth    Outpatient Encounter Medications as of 09/02/2020  Medication Sig Note  . furosemide (LASIX) 20 MG tablet TAKE 1 TABLET(20 MG) BY MOUTH DAILY AS NEEDED FOR EDEMA (Patient taking differently: 10 mg daily.)   . ADVAIR DISKUS 500-50 MCG/DOSE AEPB Inhale 1 puff into the lungs 2 (two) times daily.   [name] albuterol (VENTOLIN HFA) 108 (90 Base) MCG/ACT inhaler INHALE 1-2 PUFFS INTO THE LUNGS EVERY 6 HOURS AS NEEDED FOR WHEEZING OR FOR SHORTNESS OF BREATH   . allopurinol (ZYLOPRIM) 300 MG tablet Take 0.5 tablets (150 mg total) by mouth 2 (two) times daily.   [name] amLODipine (NORVASC) 10 MG tablet Take 0.5 tablets (5 mg total) by mouth daily.   [name] apixaban (ELIQUIS) 5 MG TABS tablet Take 1 tablet (5 mg total) by mouth 2 (two)  times daily.   [name] aspirin EC 81 MG tablet Take 1 tablet (81 mg total) by mouth daily.   . Ensure Max Protein (ENSURE MAX PROTEIN) LIQD Take 330 mLs (11 oz total) by mouth 2 (two) times daily. (Patient taking differently: Take 11 oz by mouth 2 (two) times daily. She drinks regular Ensure not Ensure Max and she prefers chocolate) 12/01/2019: Will contact [employer] rep for coupons and/or free samples  . fluticasone (FLONASE) 50 MCG/ACT nasal spray SHAKE LIQUID AND INSTILL ONE (1) SPRAY IN EACH NOSTRIL ONCE DAILY   . metoprolol tartrate (LOPRESSOR) 50 MG tablet TAKE 1 TABLET BY MOUTH TWICE DAILY   . NYAMYC powder APPLY TOPICALLY TO AFFECTED AREAS TWICE DAILY   . omeprazole (PRILOSEC) 20 MG capsule Take 1 capsule (20 mg total) by mouth daily.   . potassium chloride SA (KLOR-CON) 20 MEQ tablet TAKE 1/2 TABLET BY MOUTH DAILY   . pravastatin (PRAVACHOL) 20 MG tablet Take 1 tablet (20 mg total) by mouth daily.   . sodium chloride (OCEAN) 0.65 % SOLN nasal spray Place 1 spray into both nostrils as needed for congestion. (Patient not taking: Reported on 06/09/2020)   . [DISCONTINUED] QUEtiapine (SEROQUEL) 25 MG tablet Take by mouth.    No facility-administered encounter medications on file as of 09/02/2020.    Patient Active Problem List   Diagnosis Date Noted  . Dry mouth 10/30/2019  . Chronic heart failure with preserved ejection fraction (HFpEF) ([place]) 09/09/2019  . Aortic stenosis 08/22/2019  . General weakness   .  Tumor lysis syndrome   . Hyperuricemia   . Thrombocytopenia ([place])   . Myeloproliferative disorder ([place]) 07/10/2019  . AKI (acute kidney injury) ([place]) 07/10/2019  . AF (paroxysmal atrial fibrillation) ([place]) 06/02/2017  . Leukocytosis 05/29/2017  . Anemia 05/29/2017  . Leg swelling 02/23/2016  . Hand cramps 11/22/2015  . GERD (gastroesophageal reflux disease) 02/09/2013  . OA (osteoarthritis) of knee 01/29/2012  . OA (osteoarthritis of spine) 10/18/2011  . Healthcare maintenance  04/25/2011  . Hypokalemia 02/26/2008  . Moderate persistent asthma 02/10/2008  . Hyperlipidemia 08/04/2007  . H/O Herpes zoster 08/29/2006  . Obesity, Class III, BMI 40-49.9 (morbid obesity) ([place]) 08/29/2006  . History of breast cyst 08/29/2006  . Essential hypertension 08/27/2006  . 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    Conditions to be addressed/monitored:HF, HTN, AFib, Asthma, myeloproliferative neoplasm  Care Plan : CCM RN- Heart Failure (Adult)  Updates made by Barrington Ellison, RN since 09/02/2020 12:00 AM    Problem: Symptom Exacerbation (Heart Failure)   Priority: High  Onset Date: 10/13/2019    Long-Range Goal: Symptom Exacerbation Prevented or Minimized   Start Date: 10/13/2019  Recent Progress: On track  Priority: High  Note:   ARE PLAN ENTRY (see longitudinal plan of care for additional care plan information)   Current Barriers:  [name] Knowledge deficit related to basic heart failure pathophysiology and self care management- successful outreach to patient to complete follow up assessment, patient states she is dealing OK with the lost of her son who was killed in a car accident on 07/08/20, she says [facility] sees her once monthly and were out this week, she says she will see her oncologist on 1/28, she denies dyspnea or overall energy levels different from her baseline and says " I've dealt with low iron for many years so I don't really notice when my Hgb gets low", she says she does not elevate her legs when sitting as she does not have a recliner. She denies heart palpitations or heart racing, she continues to voice good understanding of her treatment plan for HF and, HTN a fib, she says she continues to weigh herself daily and record her weights in the [facility] calendar this CCM RN mailed to her. She says she continues to drink chocolate Ensure and  would appreciate any samples she can get.  Case Manager Clinical Goal(s):  [name] Over the next 30 - 60 days, patient will weigh self daily and record- met . Over the next 30 - 60 days, patient will verbalize understanding of Heart Failure Action Plan and when to call doctor- met . Over the next 30 - 60 days, patient will take all Heart Failure mediations as prescribed- not met patient taking metoprolol only daily not twice daily and is taking 1/2 of table, not a whole tablet . Over the next 30 - 60 days, patient will weigh daily and record (notifying MD of 3 lb weight gain over night or 5 lb in a week)-   Interventions:  . Reviewed Heart Failure a fib and HTN  treatment plans  . Assessed frequency of [facility] home visits and reviewed latest faxed notes . Reviewed medications with patient and discussed medication taking behavior . Discussed plans with patient for ongoing care management follow up and provided patient with direct contact information for care management team . Reviewed weight gain parameters indicating need for  provider notification  .  Reviewed role of diuretics in prevention of fluid overload and management of heart failure . Provided positive reinforcement for patient's excellent disease self management skills . Reviewed scheduled/upcoming provider appointments  . Will plan to deliver chocolate Ensure to patient at her appointment with Dr Irene Limbo on 09/02/20 . 09/02/20 Delivered 1 case ( 24 units) of chocolate Ensure to [facility] for patient to take with her after she completed her appointment with Dr. Irene Limbo today  Patient Self Care Activities:  . Takes Heart Failure Medications as prescribed . Weighs daily and record (notifying MD of 3 lb weight gain over night or 5 lb in a week) . Verbalizes understanding of and follows CHF Action Plan . Adheres to low sodium diet, heart healthy diet . - follows rescue plan if symptoms flare-up . - knows when to call the doctor . -  tracks symptoms and what helps feel better or worse . - dresses right for the weather, hot or cold      Plan:The care management team will reach out to the patient again over the next 30-60 days.  Kelli Churn RN, CCM, [facility] RN Care Manager [phone]

## 2020-09-03 NOTE — Progress Notes (Signed)
Internal Medicine Clinic Resident  I have personally reviewed this encounter including the documentation in this note and/or discussed this patient with the care management provider. I will address any urgent items identified by the care management provider and will communicate my actions to the patient's PCP. I have reviewed the patient's CCM visit with my supervising attending, Dr Williams.  Anthon Harpole, MD  [facility] PGY-2 09/03/2020    

## 2020-09-07 NOTE — Progress Notes (Signed)
CCM encounter reviewed and plan approved as noted by Dr. Aslam. 

## 2020-09-12 ENCOUNTER — Encounter: Payer: Self-pay | Admitting: Gastroenterology

## 2020-09-12 ENCOUNTER — Ambulatory Visit: Payer: PPO | Admitting: Gastroenterology

## 2020-09-12 NOTE — Addendum Note (Signed)
Addended by: Hulan Fray on: 09/12/2020 05:02 PM   Modules accepted: Orders

## 2020-09-13 ENCOUNTER — Telehealth: Payer: PPO

## 2020-09-24 ENCOUNTER — Other Ambulatory Visit: Payer: Self-pay | Admitting: Internal Medicine

## 2020-09-26 NOTE — Telephone Encounter (Signed)
Can we clarify how much lasix she is on at home regularly? There is a discrepancy between the notes and her medication list.

## 2020-09-26 NOTE — Telephone Encounter (Signed)
Pt was called - no answer; left message to call the office.

## 2020-09-27 ENCOUNTER — Ambulatory Visit: Payer: PPO | Admitting: *Deleted

## 2020-09-27 ENCOUNTER — Telehealth: Payer: Self-pay

## 2020-09-27 DIAGNOSIS — I480 Paroxysmal atrial fibrillation: Secondary | ICD-10-CM

## 2020-09-27 DIAGNOSIS — I5032 Chronic diastolic (congestive) heart failure: Secondary | ICD-10-CM

## 2020-09-27 DIAGNOSIS — D471 Chronic myeloproliferative disease: Secondary | ICD-10-CM

## 2020-09-27 DIAGNOSIS — J4540 Moderate persistent asthma, uncomplicated: Secondary | ICD-10-CM

## 2020-09-27 DIAGNOSIS — I10 Essential (primary) hypertension: Secondary | ICD-10-CM

## 2020-09-27 NOTE — Telephone Encounter (Signed)
#  30 with 6 refills sent 08/12/2020. Call placed to [place] at [facility]. States they last filled a Rx for 90 tabs and patient picked that up on 08/14/2020. The earliest fill date for 30 tabs will be 10/10/2020. Patient notified. States she takes 1 tab of lasix everyday, not PRN edema. States this was not in her pill pack from [facility]. She is requesting refill on lasix at [facility] so she can take one tab everyday.

## 2020-09-27 NOTE — Telephone Encounter (Signed)
Called pt again, no answer -left message to call the office. 

## 2020-09-27 NOTE — Telephone Encounter (Signed)
Please call pt back.

## 2020-09-27 NOTE — Telephone Encounter (Signed)
-----   Message from Barrington Ellison, RN sent at 09/27/2020  3:20 PM EST ----- Regarding: FW: Patient returning call I figured it out; it was her pharmacy telling her she doesn't have any refills on her Lasix and they directed her to call her provider for refills. I will message her provider. Marcie Bal ----- Message ----- From: Barrington Ellison, RN Sent: 09/27/2020   3:15 PM EST To: Imp Front Desk Pool, Imp Triage Nurse Pool Subject: Patient returning call                         Mrs Koelzer states someone from the clinic called her twice and she thought it was about her furosemide. I did not see any chart notes about a call placed to her from clinic staff. Please call patient back if you called her. Thanks, Marcie Bal

## 2020-09-27 NOTE — Chronic Care Management (AMB) (Addendum)
Chronic Care Management   CCM RN Visit Note  09/27/2020 Name: Erika [name] MRN: [id number] DOB: 09/14/1940  Subjective: Erika [name] is a 79 y.o. year old female who is a primary care patient of Mullen, Emily B, MD. The care management team was consulted for assistance with disease management and care coordination needs.    Engaged with patient by telephone for follow up visit in response to provider referral for case management and/or care coordination services.   Consent to Services:  The patient was given information about Chronic Care Management services, agreed to services, and gave verbal consent prior to initiation of services.  Please see initial visit note for detailed documentation.   Patient agreed to services and verbal consent obtained.   Assessment: Review of patient past medical history, allergies, medications, health status, including review of consultants reports, laboratory and other test data, was performed as part of comprehensive evaluation and provision of chronic care management services.   SDOH (Social Determinants of Health) assessments and interventions performed:    CCM Care Plan  Allergies  Allergen Reactions  . Prednisone Other (See Comments)    REACTION: Psychosis, talking out of head, insomnia  . Seroquel [Quetiapine Fumarate] Nausea Only and Other (See Comments)    Caused very dry mouth    Outpatient Encounter Medications as of 09/27/2020  Medication Sig Note  . furosemide (LASIX) 20 MG tablet TAKE 1 TABLET(20 MG) BY MOUTH DAILY AS NEEDED FOR EDEMA (Patient taking differently: 10 mg daily.)   . ADVAIR DISKUS 500-50 MCG/DOSE AEPB Inhale 1 puff into the lungs 2 (two) times daily.   . albuterol (VENTOLIN HFA) 108 (90 Base) MCG/ACT inhaler INHALE 1-2 PUFFS INTO THE LUNGS EVERY 6 HOURS AS NEEDED FOR WHEEZING OR FOR SHORTNESS OF BREATH   . allopurinol (ZYLOPRIM) 300 MG tablet Take 0.5 tablets (150 mg total) by mouth 2 (two) times daily.   . amLODipine  (NORVASC) 10 MG tablet Take 0.5 tablets (5 mg total) by mouth daily.   . apixaban (ELIQUIS) 5 MG TABS tablet Take 1 tablet (5 mg total) by mouth 2 (two) times daily.   . aspirin EC 81 MG tablet Take 1 tablet (81 mg total) by mouth daily.   . Ensure Max Protein (ENSURE MAX PROTEIN) LIQD Take 330 mLs (11 oz total) by mouth 2 (two) times daily. (Patient taking differently: Take 11 oz by mouth 2 (two) times daily. She drinks regular Ensure not Ensure Max and she prefers chocolate) 12/01/2019: Will contact [employer] rep for coupons and/or free samples  . fluticasone (FLONASE) 50 MCG/ACT nasal spray SHAKE LIQUID AND INSTILL ONE (1) SPRAY IN EACH NOSTRIL ONCE DAILY   . metoprolol tartrate (LOPRESSOR) 50 MG tablet TAKE 1 TABLET BY MOUTH TWICE DAILY   . NYAMYC powder APPLY TOPICALLY TO AFFECTED AREAS TWICE DAILY   . omeprazole (PRILOSEC) 20 MG capsule Take 1 capsule (20 mg total) by mouth daily.   . potassium chloride SA (KLOR-CON) 20 MEQ tablet TAKE 1/2 TABLET BY MOUTH DAILY   . pravastatin (PRAVACHOL) 20 MG tablet Take 1 tablet (20 mg total) by mouth daily.   . sodium chloride (OCEAN) 0.65 % SOLN nasal spray Place 1 spray into both nostrils as needed for congestion. (Patient not taking: Reported on 06/09/2020)    No facility-administered encounter medications on file as of 09/27/2020.    Patient Active Problem List   Diagnosis Date Noted  . Dry mouth 10/30/2019  . Chronic heart failure with preserved ejection fraction (HFpEF) (  [place]) 09/09/2019  . Aortic stenosis 08/22/2019  . General weakness   . Tumor lysis syndrome   . Hyperuricemia   . Thrombocytopenia ([place])   . Myeloproliferative disorder ([place]) 07/10/2019  . AKI (acute kidney injury) ([place]) 07/10/2019  . AF (paroxysmal atrial fibrillation) ([place]) 06/02/2017  . Leukocytosis 05/29/2017  . Anemia 05/29/2017  . Leg swelling 02/23/2016  . Hand cramps 11/22/2015  . GERD (gastroesophageal reflux disease) 02/09/2013  . OA (osteoarthritis) of knee  01/29/2012  . OA (osteoarthritis of spine) 10/18/2011  . Healthcare maintenance 04/25/2011  . Hypokalemia 02/26/2008  . Moderate persistent asthma 02/10/2008  . Hyperlipidemia 08/04/2007  . H/O Herpes zoster 08/29/2006  . Obesity, Class III, BMI 40-49.9 (morbid obesity) ([place]) 08/29/2006  . History of breast cyst 08/29/2006  . Essential hypertension 08/27/2006  . 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    Conditions to be addressed/monitored:HF, HTN, AFib, Asthma, myeloproliferative neoplasm  Care Plan : CCM RN- Heart Failure (Adult)  Updates made by Barrington Ellison, RN since 09/27/2020 12:00 AM    Problem: Symptom Exacerbation (Heart Failure)   Priority: High  Onset Date: 10/13/2019    Long-Range Goal: Symptom Exacerbation Prevented or Minimized   Start Date: 10/13/2019  Recent Progress: On track  Priority: High  Note:   ARE PLAN ENTRY (see longitudinal plan of care for additional care plan information)   Current Barriers:  [name] Knowledge deficit related to basic heart failure pathophysiology and self care management- successful outreach to patient to complete follow up assessment, patient states she is doing well, did not require a blood transfusion the last time she saw her oncologist in late January, she says someone called her from the clinic about her furosemide, she denies dyspnea or overall energy levels different from her baseline, chronic lower extremity edema at baseline,  she says she does not elevate her legs when sitting as she does not have a recliner. She denies heart palpitations or heart racing, she continues to voice good understanding of her treatment plan for HF and, HTN a fib, she says she continues to weigh herself daily and record her weights in the [place] [facility] calendar this CCM RN mailed to her. She says she continues to receive home visits form remote  Health staff as part of their home based primary care program. She says she continues to drink chocolate Ensure and would appreciate any samples she can get. She says she did not receive the samples that were left for her at the cancer center by this CCM RN on 09/02/20  Case Manager Clinical Goal(s):  [name] Over the next 30 - 60 days, patient will weigh self daily and record- met . Over the next 30 - 60 days, patient will verbalize understanding of Heart Failure Action Plan and when to call doctor- met . Over the next 30 - 60 days, patient will take all Heart Failure mediations as prescribed . Over the next 30 - 60 days, patient will weigh daily and record (notifying MD of 3 lb weight gain over night or 5 lb in a week)-   Interventions:  . Reviewed Heart Failure a fib and HTN  treatment plans  . Assessed frequency of [facility] home visits and reviewed latest faxed notes . Reviewed medications with patient and discussed medication taking behavior . Discussed plans with patient for ongoing care management follow up and provided patient with direct contact information for  care management team . Reviewed weight gain parameters indicating need for  provider notification  . Reviewed role of diuretics in prevention of fluid overload and management of heart failure . Called her pharmacy and verified they called patient, not the clinic staff ,as she has no refills on her lasix . Messaged provider about the need for furosemide refills . Provided positive reinforcement for patient's excellent disease self management skills . Reviewed scheduled/upcoming provider appointments  . Advised patient to have son Steve call this CCM RN on Friday morning 09/30/20 to arrange to pick up chocolate Ensure  Patient Self Care Activities:  . Takes Heart Failure Medications as prescribed . Weighs daily and record (notifying MD of 3 lb weight gain over night or 5 lb in a week) . Verbalizes understanding of and follows CHF  Action Plan . Adheres to low sodium diet, heart healthy diet . - follows rescue plan if symptoms flare-up . - knows when to call the doctor . - tracks symptoms and what helps feel better or worse . - dresses right for the weather, hot or cold    Care Plan : CCM RN Asthma (Adult)  Updates made by ,  S, RN since 09/27/2020 12:00 AM    Problem: Symptom Exacerbation (Asthma)   Priority: High  Onset Date: 08/12/2020    Long-Range Goal: Symptom Exacerbation Prevented or Minimized   Start Date: 08/12/2020  Recent Progress: On track  Priority: High  Note:   CARE PLAN ENTRY (see longitudinal plan of care for additional care plan information)  Current Barriers:   . Knowledge deficits related to basic asthma self care/management- patient denies respiratory issues at present    Case Manager Clinical Goal(s):  Over the next 30 days, patient will not be hospitalized for asthma exacerbation    Interventions:    Review definition of asthma- airways are more sensitive, common sxs- cough,chest tightness, SOB, wheezing,low energy  Review Sxs of asthma flare- coughing especially at night, out of breath more easily, wheezing,chest tightness, fast breathing at rest  Review sxs of severe flare- severe difficulty breathing, too short of breath to speak a full sentence or walk across a room, lips or fingers turning blue, feeling like you are going to pass out  Assessed patient's knowledge of asthma triggers and educated patient on how to avoid triggers- I.e.- wear mask when dusting,sweeping or doing yard work, use pump instead of aerosol bottles, don't use bleach or ammonia, use exhaust fans when cooking, set car air conditioning to recirculate so less pollution gets in  Assessed patient's knowledge of asthma medications and ability to distinguish between rescue medications and controller medications and provided appropriate education, advise to carry rescue meds at all times  Controlling  triggers-  get early treatment for colds or URIs, wear scarf around your face in cold weather  Teach patient that asthma is not in good control if using rescue inhaler more than twice weekly or if wake up at night with sxs more than 2 times per month  Treat GERD since can cause coughing, increase airway inflammation, and trigger flare ups  Lose weight- excess weight puts stress on airways  Reinforce the need for taking controlled medications as indicated  Provided instruction about proper use of medications used for management of asthma including inhalers  Discuss the importance of pneumonia and flu vaccine  Reinforce what to do in an asthma attack  Patient Self Care Activities:  . Patient verbalizes understanding of plan to mange asthma to prevent   hospitalization . Self administers medications as prescribed . Attends all scheduled provider appointments . Calls pharmacy for medication refills . Calls provider office for new concerns or questions        Plan:The care management team will reach out to the patient again over the next 30-60 days.     RN, CCM, [facility] CCM Clinic RN Care Manager [phone]        

## 2020-09-27 NOTE — Telephone Encounter (Signed)
Returned call to patient. Line went dead after a few seconds. Patient has tele appt today at 1500 with CCM. Marcie Bal will ask patient what Triage can do for patient on her call.

## 2020-09-27 NOTE — Patient Instructions (Signed)
Visit Information It was nice speaking with you today. PATIENT GOALS: Patient Care Plan: CCM RN- Heart Failure (Adult)    Problem Identified: Symptom Exacerbation (Heart Failure)   Priority: High  Onset Date: 10/13/2019    Long-Range Goal: Symptom Exacerbation Prevented or Minimized   Start Date: 10/13/2019  This Visit's Progress: On track  Recent Progress: On track  Priority: High  Note:   ARE PLAN ENTRY (see longitudinal plan of care for additional care plan information)   Current Barriers:  [name] Knowledge deficit related to basic heart failure pathophysiology and self care management- successful outreach to patient to complete follow up assessment, patient states she is doing well, did not require a blood transfusion the last time she saw her oncologist in late January, she says someone called her from the clinic about her furosemide, she denies dyspnea or overall energy levels different from her baseline, chronic lower extremity edema at baseline,  she says she does not elevate her legs when sitting as she does not have a recliner. She denies heart palpitations or heart racing, she continues to voice good understanding of her treatment plan for HF and, HTN a fib, she says she continues to weigh herself daily and record her weights in the [place] [facility] calendar this CCM RN mailed to her. She says she continues to receive home visits form remote Health staff as part of their home based primary care program. She says she continues to drink chocolate Ensure and would appreciate any samples she can get. She says she did not receive the samples that were left for her at the cancer center by this CCM RN on 09/02/20  Case Manager Clinical Goal(s):  [name] Over the next 30 - 60 days, patient will weigh self daily and record- met . Over the next 30 - 60 days, patient will verbalize understanding of Heart Failure Action Plan and when to call doctor- met . Over the next 30 - 60 days,  patient will take all Heart Failure mediations as prescribed . Over the next 30 - 60 days, patient will weigh daily and record (notifying MD of 3 lb weight gain over night or 5 lb in a week)-   Interventions:  . Reviewed Heart Failure a fib and HTN  treatment plans  . Assessed frequency of [facility] home visits and reviewed latest faxed notes . Reviewed medications with patient and discussed medication taking behavior . Discussed plans with patient for ongoing care management follow up and provided patient with direct contact information for care management team . Reviewed weight gain parameters indicating need for  provider notification  . Reviewed role of diuretics in prevention of fluid overload and management of heart failure . Called her pharmacy and the verified they called patient, not the clinic staff ,as she has no refills on her lasix . Messaged provider about the need for furosemide refills . Provided positive reinforcement for patient's excellent disease self management skills . Reviewed scheduled/upcoming provider appointments  . Advised patient to have son [name] call this CCM RN on Friday morning 09/30/20 to arrange to pick up chocolate Ensure  Patient Self Care Activities:  . Takes Heart Failure Medications as prescribed . Weighs daily and record (notifying MD of 3 lb weight gain over night or 5 lb in a week) . Verbalizes understanding of and follows CHF Action Plan . Adheres to low sodium diet, heart healthy diet . - follows rescue plan if symptoms flare-up . - knows when to  call the doctor . - tracks symptoms and what helps feel better or worse . - dresses right for the weather, hot or cold    Patient Care Plan: CCM RN Asthma (Adult)    Problem Identified: Symptom Exacerbation (Asthma)   Priority: High  Onset Date: 08/12/2020    Long-Range Goal: Symptom Exacerbation Prevented or Minimized   Start Date: 08/12/2020  This Visit's Progress: On track  Recent Progress:  On track  Priority: High  Note:   CARE PLAN ENTRY (see longitudinal plan of care for additional care plan information)  Current Barriers:   [name] Knowledge deficits related to basic asthma self care/management- patient denies respiratory issues at present    Case Manager Clinical Goal(s):  Over the next 30 days, patient will not be hospitalized for asthma exacerbation    Interventions:    Review definition of asthma- airways are more sensitive, common sxs- cough,chest tightness, SOB, wheezing,low energy  Review Sxs of asthma flare- coughing especially at night, out of breath more easily, wheezing,chest tightness, fast breathing at rest  Review sxs of severe flare- severe difficulty breathing, too short of breath to speak a full sentence or walk across a room, lips or fingers turning blue, feeling like you are going to pass out  Assessed patient's knowledge of asthma triggers and educated patient on how to avoid triggers- I.e.- wear mask when dusting,sweeping or doing yard work, use pump instead of aerosol bottles, don't use bleach or ammonia, use exhaust fans when cooking, set car air conditioning to recirculate so less pollution gets in  Assessed patient's knowledge of asthma medications and ability to distinguish between rescue medications and controller medications and provided appropriate education, advise to carry rescue meds at all times  Controlling triggers-  get early treatment for colds or URIs, wear scarf around your face in cold weather  Teach patient that asthma is not in good control if using rescue inhaler more than twice weekly or if wake up at night with sxs more than 2 times per month  Treat GERD since can cause coughing, increase airway inflammation, and trigger flare ups  Lose weight- excess weight puts stress on airways  Reinforce the need for taking controlled medications as indicated  Provided instruction about proper use of medications used for management of  asthma including inhalers  Discuss the importance of pneumonia and flu vaccine  Reinforce what to do in an asthma attack  Patient Self Care Activities:  . Patient verbalizes understanding of plan to mange asthma to prevent hosptialization . Self administers medications as prescribed . Attends all scheduled provider appointments . Calls pharmacy for medication refills . Calls provider office for new concerns or questions        The patient verbalized understanding of instructions, educational materials, and care plan provided today and declined offer to receive copy of patient instructions, educational materials, and care plan.   The care management team will reach out to the patient again over the next 30-60 days.   Kelli Churn RN, CCM, [facility] RN Care Manager [phone]

## 2020-09-28 ENCOUNTER — Other Ambulatory Visit: Payer: Self-pay | Admitting: Internal Medicine

## 2020-09-28 DIAGNOSIS — I480 Paroxysmal atrial fibrillation: Secondary | ICD-10-CM

## 2020-09-28 MED ORDER — FUROSEMIDE 40 MG PO TABS: 40.0000 mg | ORAL_TABLET | Freq: Every day | ORAL | 3 refills | Status: DC

## 2020-09-28 NOTE — Progress Notes (Signed)
Internal Medicine Clinic Resident  I have personally reviewed this encounter including the documentation in this note and/or discussed this patient with the care management provider. I will address any urgent items identified by the care management provider and will communicate my actions to the patient's PCP. I have reviewed the patient's CCM visit with my supervising attending, Dr Mullen.  Jeffrey Rishon Thilges, MD 09/28/2020    

## 2020-09-28 NOTE — Telephone Encounter (Signed)
Thank you everyone!  I have sent in the prescription to her mail order pharmacy.  Had to refuse the other one due to [employer] wanting to send it only to [facility].  Should be corrected now.  Let me know if any other issues.

## 2020-09-28 NOTE — Progress Notes (Signed)
Internal Medicine Clinic Attending  CCM services provided by the care management provider and their documentation were discussed with Dr. Steen. We reviewed the pertinent findings, urgent action items addressed by the resident and non-urgent items to be addressed by the PCP.  I agree with the assessment, diagnosis, and plan of care documented in the CCM and resident's note.  Tunis Gentle, MD 09/28/2020  

## 2020-09-30 ENCOUNTER — Other Ambulatory Visit: Payer: Self-pay | Admitting: Internal Medicine

## 2020-09-30 DIAGNOSIS — K219 Gastro-esophageal reflux disease without esophagitis: Secondary | ICD-10-CM

## 2020-10-07 ENCOUNTER — Other Ambulatory Visit: Payer: Self-pay | Admitting: Internal Medicine

## 2020-10-07 DIAGNOSIS — I480 Paroxysmal atrial fibrillation: Secondary | ICD-10-CM

## 2020-10-07 MED ORDER — PRAVASTATIN SODIUM 20 MG PO TABS: 20.0000 mg | ORAL_TABLET | Freq: Every day | ORAL | 3 refills | Status: AC

## 2020-10-07 MED ORDER — AMLODIPINE BESYLATE 10 MG PO TABS: 5.0000 mg | ORAL_TABLET | Freq: Every day | ORAL | 3 refills | Status: AC

## 2020-10-07 MED ORDER — ADVAIR DISKUS 500-50 MCG/DOSE IN AEPB: 1.0000 | INHALATION_SPRAY | Freq: Two times a day (BID) | RESPIRATORY_TRACT | 3 refills | Status: AC

## 2020-10-07 MED ORDER — APIXABAN 5 MG PO TABS: 5.0000 mg | ORAL_TABLET | Freq: Two times a day (BID) | ORAL | 11 refills | Status: AC

## 2020-10-07 NOTE — Telephone Encounter (Signed)
MED REFILL REQUEST  apixaban (ELIQUIS) 5 MG TABS tablet  ADVAIR DISKUS 500-50 MCG/DOSE AEPB  amLODipine (NORVASC) 10 MG tablet  pravastatin (PRAVACHOL) 20 MG tablet   [address] [place], [place] Phone:  [phone]  Fax:  [phone]

## 2020-10-14 ENCOUNTER — Ambulatory Visit: Payer: PPO | Admitting: *Deleted

## 2020-10-14 DIAGNOSIS — I480 Paroxysmal atrial fibrillation: Secondary | ICD-10-CM

## 2020-10-14 DIAGNOSIS — I5032 Chronic diastolic (congestive) heart failure: Secondary | ICD-10-CM

## 2020-10-14 DIAGNOSIS — I10 Essential (primary) hypertension: Secondary | ICD-10-CM

## 2020-10-14 DIAGNOSIS — D471 Chronic myeloproliferative disease: Secondary | ICD-10-CM

## 2020-10-14 NOTE — Chronic Care Management (AMB) (Addendum)
   10/14/2020  Erika [name] Mar 11, 1941 [id number]  Patient called requesting permission for her son [name] to  pick up Chocolate Ensure at this CCM RN's  [place] office this afternoon.  Arranged for one case ( 24 - 8 oz) Chocolate Ensure pick up at 1:00 pm.   Kelli Churn RN, CCM, [facility] RN Care Manager [phone]

## 2020-10-25 ENCOUNTER — Telehealth: Payer: PPO

## 2020-10-25 ENCOUNTER — Telehealth: Payer: Self-pay | Admitting: *Deleted

## 2020-10-25 NOTE — Telephone Encounter (Signed)
  Chronic Care Management   Outreach Note  10/25/2020 Name: Erika [name] MRN: [id number] DOB: [birth year]  Referred by: [name], MD Reason for referral : Chronic Care Management (HF, HTN, AFib, Asthma, myeloproliferative neoplasm)   An unsuccessful telephone outreach was attempted today. The patient was referred to the case management team for assistance with care management and care coordination.   Follow Up Plan: A HIPAA compliant phone message was left for the patient providing contact information and requesting a return call.  The care management team will reach out to the patient again over the next 7-14 days.   Kelli Churn RN, CCM, [facility] RN Care Manager [phone]

## 2020-10-26 ENCOUNTER — Other Ambulatory Visit: Payer: Self-pay | Admitting: *Deleted

## 2020-10-26 ENCOUNTER — Telehealth: Payer: Self-pay

## 2020-10-26 ENCOUNTER — Ambulatory Visit: Payer: PPO | Admitting: Gastroenterology

## 2020-10-26 ENCOUNTER — Other Ambulatory Visit: Payer: Self-pay

## 2020-10-26 ENCOUNTER — Encounter: Payer: Self-pay | Admitting: Gastroenterology

## 2020-10-26 VITALS — BP 120/50 | HR 88 | Ht 67.0 in | Wt 234.8 lb

## 2020-10-26 DIAGNOSIS — Z7902 Long term (current) use of antithrombotics/antiplatelets: Secondary | ICD-10-CM

## 2020-10-26 DIAGNOSIS — D649 Anemia, unspecified: Secondary | ICD-10-CM

## 2020-10-26 DIAGNOSIS — D471 Chronic myeloproliferative disease: Secondary | ICD-10-CM

## 2020-10-26 MED ORDER — PLENVU 140 G PO SOLR: 140.0000 g | ORAL | 0 refills | Status: DC

## 2020-10-26 NOTE — Telephone Encounter (Signed)
Please see the request below, we would like to hold the Eliquis 2 days before      Request for surgical clearance:     Endoscopy Procedure  What type of surgery is being performed?     Colonoscopy   When is this surgery scheduled?     01-17-2021  What type of clearance is required ?   Pharmacy  Are there any medications that need to be held prior to surgery and how long? Yes, Eliquis 2 days prior  Practice name and name of physician performing surgery?      [facility]  What is your office phone and fax number?      Phone- [phone]  [phone]  Anesthesia type (None, local, MAC, general) ?       MAC

## 2020-10-26 NOTE — Patient Instructions (Addendum)
If you are age 78 or older, your body mass index should be between 23-30. Your Body mass index is 36.77 kg/m. If this is out of the aforementioned range listed, please consider follow up with your Primary Care Provider.  If you are age 59 or younger, your body mass index should be between 19-25. Your Body mass index is 36.77 kg/m. If this is out of the aformentioned range listed, please consider follow up with your Primary Care Provider.   You will be contacted by our office prior to your procedure for directions on holding your ELIQUIS.  If you do not hear from our office 1 week prior to your scheduled procedure, please call [phone] to discuss.    You have been scheduled for a colonoscopy. Please follow written instructions given to you at your visit today.  Please pick up your prep supplies at the pharmacy within the next 1-3 days. If you use inhalers (even only as needed), please bring them with you on the day of your procedure.  It was a pleasure to see you today!  Dr. Tarri Glenn

## 2020-10-26 NOTE — Progress Notes (Signed)
Referring Provider: [name], MD Primary Care Physician:  [name], MD  Reason for Consultation:  Colon cancer screening   IMPRESSION:  Need for colon cancer screening On Eliquis for atrial fibrillation  Surveillance colonoscopy is due.  I have recommended holding Eliquis for 2 days before endoscopy.  I discussed with the patient that there is a low, but real, risk of a cardiovascular event such as heart attack, stroke, or embolism/thrombosis while off Eliquis. Will communicate by phone or EMR with patient's prescribing provider to confirm that holding the Eliquis is appropriate at this time.     PLAN: Colonoscopy after an Eliquis washout  Please see the "Patient Instructions" section for addition details about the plan.  HPI: Erika [name] is a 80 y.o. female referred by Dr. Daryll Drown for colon cancer screening. She is atrial fibrillation on Eliquis, obesity, asthma, hypertension, chronic anemia and myeloproliferative disorder. Echo 08/21/19 showed EF 55-60%.   Seen in 2019 for colonoscopy, but she had to cancel after her son had a stroke.   Ready to proceed with colonoscopy now. GI ROS is negative.   She had a screening colonoscopy in 2004. Colon polyp pathology 04/15/2003 showed benign polypoid colonic mucosa with lymphoid aggregate  No known family history of colon cancer or polyps. No family history of uterine/endometrial cancer, pancreatic cancer or gastric/stomach cancer.   Past Medical History:  Diagnosis Date  . Anemia    with menses  . Asthma   . Atrial flutter, paroxysmal ([place])   . Back pain    status post surgery 2002  . Breast cyst    Excesion with FNA, begnin in 2004.   . Degenerative joint disease of spine    Imaging 2005,  Degenerative hypertrophic facet arthritis changes L4-5 and L5-S1..   . History of shingles    Recurrent with post herpetic neuralgia.   [name] Hypertension   . Lymphadenopathy    Of the mediastinum, Right side CXR 2008, not  read on 2010 cxr.   . Menopause   . Obesity    BMI 54  . Psychosis ([place])    Secondary to prednisone.  . Shingles   . Stasis dermatitis    W/ LE edema, prviously on lasix now on mazxide.   . SVT (supraventricular tachycardia) [facility] June 2009   one run while hospitalized  . Tuberculosis    active TB treated in 2002, hx of paraspinal lumbar TB,     Past Surgical History:  Procedure Laterality Date  . BREAST CYST ASPIRATION Left   . Breast cyst biopsy    . CHOLECYSTECTOMY  02/02/2012   Procedure: LAPAROSCOPIC CHOLECYSTECTOMY;  Surgeon: Zenovia Jarred, MD;  Location: [facility];  Service: General;  Laterality: N/A;  . Hemilaminectomy of L4, L5 and S1 decompression of tumor in epidural space  2000  . IR FLUORO GUIDED NEEDLE PLC ASPIRATION/INJECTION LOC  07/17/2019    Current Outpatient Medications  Medication Sig Dispense Refill  . ADVAIR DISKUS 500-50 MCG/DOSE AEPB Inhale 1 puff into the lungs 2 (two) times daily. 180 each 3  . albuterol (VENTOLIN HFA) 108 (90 Base) MCG/ACT inhaler INHALE 1-2 PUFFS INTO THE LUNGS EVERY 6 HOURS AS NEEDED FOR WHEEZING OR FOR SHORTNESS OF BREATH 8.5 g 11  . allopurinol (ZYLOPRIM) 300 MG tablet Take 0.5 tablets (150 mg total) by mouth 2 (two) times daily. 90 tablet 3  . amLODipine (NORVASC) 10 MG tablet Take 0.5 tablets (5 mg total) by mouth daily. 90 tablet 3  .  apixaban (ELIQUIS) 5 MG TABS tablet Take 1 tablet (5 mg total) by mouth 2 (two) times daily. 60 tablet 11  . aspirin EC 81 MG tablet Take 1 tablet (81 mg total) by mouth daily. 90 tablet 3  . Ensure Max Protein (ENSURE MAX PROTEIN) LIQD Take 330 mLs (11 oz total) by mouth 2 (two) times daily. (Patient taking differently: Take 11 oz by mouth 2 (two) times daily. She drinks regular Ensure not Ensure Max and she prefers chocolate)    . fluticasone (FLONASE) 50 MCG/ACT nasal spray SHAKE LIQUID AND INSTILL ONE (1) SPRAY IN EACH NOSTRIL ONCE DAILY 16 g 4  . furosemide (LASIX) 40 MG tablet Take 1 tablet (40  mg total) by mouth daily. 90 tablet 3  . metoprolol tartrate (LOPRESSOR) 50 MG tablet TAKE 1 TABLET BY MOUTH TWICE DAILY 60 tablet 11  . NYAMYC powder APPLY TOPICALLY TO AFFECTED AREAS TWICE DAILY 15 g 3  . omeprazole (PRILOSEC) 20 MG capsule TAKE 1 CAPSULE BY MOUTH DAILY 30 capsule 3  . potassium chloride SA (KLOR-CON) 20 MEQ tablet TAKE 1/2 TABLET BY MOUTH DAILY 15 tablet 1  . pravastatin (PRAVACHOL) 20 MG tablet Take 1 tablet (20 mg total) by mouth daily. 90 tablet 3  . sodium chloride (OCEAN) 0.65 % SOLN nasal spray Place 1 spray into both nostrils as needed for congestion. (Patient not taking: Reported on 06/09/2020) 88 mL 0   No current facility-administered medications for this visit.    Allergies as of 10/26/2020 - Review Complete 09/27/2020  Allergen Reaction Noted  . Prednisone Other (See Comments) 02/26/2008  . Seroquel [quetiapine fumarate] Nausea Only and Other (See Comments) 12/30/2019    Family History  Problem Relation Age of Onset  . Stroke Mother        at young age  . Heart disease Mother   . Heart attack Father   . Heart disease Father   . Cancer Sister        Unknown  . Stroke Brother   . Stroke Brother   . Breast cancer Other   . Sickle cell anemia Brother   . Diabetes Brother   . Stroke Brother   . Stroke Son     Physical Exam: General:   Alert,  well-nourished, pleasant and cooperative in NAD Head:  Normocephalic and atraumatic. Eyes:  Sclera clear, no icterus.   Conjunctiva pink. Ears:  Normal auditory acuity. Nose:  No deformity, discharge,  or lesions. Mouth:  No deformity or lesions.   Neck:  Supple; no masses or thyromegaly. Lungs:  Clear throughout to auscultation.   No wheezes. Heart:  Regular rate and rhythm; no murmurs. Abdomen:  Soft, nontender, nondistended, normal bowel sounds, no rebound or guarding. No hepatosplenomegaly.   Rectal:  Deferred  Msk:  Symmetrical. No boney deformities LAD: No inguinal or umbilical LAD Extremities:  No  clubbing or edema. Neurologic:  Alert and  oriented x4;  grossly nonfocal Skin:  Intact without significant lesions or rashes. Psych:  Alert and cooperative. Normal mood and affect.    Standley Bargo L. Tarri Glenn, MD, MPH 10/26/2020, 9:43 AM

## 2020-10-28 ENCOUNTER — Other Ambulatory Visit: Payer: Self-pay

## 2020-10-28 ENCOUNTER — Inpatient Hospital Stay: Payer: PPO

## 2020-10-28 ENCOUNTER — Telehealth: Payer: PPO

## 2020-10-28 ENCOUNTER — Inpatient Hospital Stay: Payer: PPO | Attending: Hematology

## 2020-10-28 DIAGNOSIS — D72829 Elevated white blood cell count, unspecified: Secondary | ICD-10-CM | POA: Diagnosis not present

## 2020-10-28 DIAGNOSIS — D649 Anemia, unspecified: Secondary | ICD-10-CM

## 2020-10-28 DIAGNOSIS — D471 Chronic myeloproliferative disease: Secondary | ICD-10-CM

## 2020-10-28 LAB — CBC WITH DIFFERENTIAL (CANCER CENTER ONLY)
Abs Immature Granulocytes: 6.79 10*3/uL — ABNORMAL HIGH (ref 0.00–0.07)
Basophils Absolute: 0.1 10*3/uL (ref 0.0–0.1)
Basophils Relative: 0 %
Eosinophils Absolute: 0.7 10*3/uL — ABNORMAL HIGH (ref 0.0–0.5)
Eosinophils Relative: 1 %
HCT: 26.5 % — ABNORMAL LOW (ref 36.0–46.0)
Hemoglobin: 8.3 g/dL — ABNORMAL LOW (ref 12.0–15.0)
Immature Granulocytes: 7 %
Lymphocytes Relative: 3 %
Lymphs Abs: 2.7 10*3/uL (ref 0.7–4.0)
MCH: 30.6 pg (ref 26.0–34.0)
MCHC: 31.3 g/dL (ref 30.0–36.0)
MCV: 97.8 fL (ref 80.0–100.0)
Monocytes Absolute: 2.4 10*3/uL — ABNORMAL HIGH (ref 0.1–1.0)
Monocytes Relative: 3 %
Neutro Abs: 83 10*3/uL — ABNORMAL HIGH (ref 1.7–7.7)
Neutrophils Relative %: 86 %
Platelet Count: 140 10*3/uL — ABNORMAL LOW (ref 150–400)
RBC: 2.71 MIL/uL — ABNORMAL LOW (ref 3.87–5.11)
RDW: 19 % — ABNORMAL HIGH (ref 11.5–15.5)
WBC Count: 95.6 10*3/uL (ref 4.0–10.5)
nRBC: 0 % (ref 0.0–0.2)

## 2020-10-28 LAB — SAMPLE TO BLOOD BANK

## 2020-10-28 LAB — CMP (CANCER CENTER ONLY)
ALT: 15 U/L (ref 0–44)
AST: 21 U/L (ref 15–41)
Albumin: 3.6 g/dL (ref 3.5–5.0)
Alkaline Phosphatase: 451 U/L — ABNORMAL HIGH (ref 38–126)
Anion gap: 10 (ref 5–15)
BUN: 30 mg/dL — ABNORMAL HIGH (ref 8–23)
CO2: 25 mmol/L (ref 22–32)
Calcium: 9 mg/dL (ref 8.9–10.3)
Chloride: 103 mmol/L (ref 98–111)
Creatinine: 1.64 mg/dL — ABNORMAL HIGH (ref 0.44–1.00)
GFR, Estimated: 32 mL/min — ABNORMAL LOW (ref >60)
Glucose, Bld: 86 mg/dL (ref 70–99)
Potassium: 4.8 mmol/L (ref 3.5–5.1)
Sodium: 138 mmol/L (ref 135–145)
Total Bilirubin: 0.4 mg/dL (ref 0.3–1.2)
Total Protein: 8 g/dL (ref 6.5–8.1)

## 2020-10-28 NOTE — Progress Notes (Signed)
Patient's hemoglobin is 8.3 today. Dr. Irene Limbo made aware and stated that if patient is not overly symptomatic patient will not require blood transfusion today. Patient states that she feels well. Vital signs stable. AVS printed and patient transported to lobby.

## 2020-11-01 ENCOUNTER — Ambulatory Visit: Payer: PPO | Admitting: *Deleted

## 2020-11-01 DIAGNOSIS — I5032 Chronic diastolic (congestive) heart failure: Secondary | ICD-10-CM

## 2020-11-01 DIAGNOSIS — I480 Paroxysmal atrial fibrillation: Secondary | ICD-10-CM

## 2020-11-01 DIAGNOSIS — I10 Essential (primary) hypertension: Secondary | ICD-10-CM

## 2020-11-01 DIAGNOSIS — D471 Chronic myeloproliferative disease: Secondary | ICD-10-CM

## 2020-11-01 DIAGNOSIS — M7989 Other specified soft tissue disorders: Secondary | ICD-10-CM

## 2020-11-01 DIAGNOSIS — J4540 Moderate persistent asthma, uncomplicated: Secondary | ICD-10-CM

## 2020-11-01 NOTE — Patient Instructions (Signed)
Visit Information It was nice speaking with you today. Patient Care Plan: CCM RN- Heart Failure (Adult)    Problem Identified: Symptom Exacerbation (Heart Failure)   Priority: High  Onset Date: 10/13/2019    Long-Range Goal: Symptom Exacerbation Prevented or Minimized   Start Date: 10/13/2019  Recent Progress: On track  Priority: High  Note:   ARE PLAN ENTRY (see longitudinal plan of care for additional care plan information)   Current Barriers:  [name] Knowledge deficit related to basic heart failure pathophysiology and self care management- successful outreach to patient to complete follow up assessment, patient states she is doing well, did not require a blood transfusion when labs were drawn on 10/28/20 at the cancer center, she denies dyspnea or overall energy levels different from her baseline, chronic lower extremity edema at baseline,  she says she does not elevate her legs when sitting as she does not have a recliner. She denies heart palpitations or heart racing, she continues to voice good understanding of her treatment plan for HF and, HTN a fib, she says she continues to weigh herself daily and record her weights in the [facility] calendar this CCM RN mailed to her. She says she continues to receive home visits from [facility] staff as part of their home based primary care program. She says she continues to drink chocolate Ensure and would appreciate any samples she can get. She says her son [name] picked up the samples last week that were left for her at the cancer center by this CCM RN on 09/02/20  Case Manager Clinical Goal(s):  [name] Over the next 30 - 60 days, patient will weigh self daily and record- met . Over the next 30 - 60 days, patient will verbalize understanding of Heart Failure Action Plan and when to call doctor- met . Over the next 30 - 60 days, patient will take all Heart Failure mediations as prescribed . Over the next 30 - 60 days, patient  will weigh daily and record (notifying MD of 3 lb weight gain over night or 5 lb in a week)-   Interventions:  . Reviewed Heart Failure a fib and HTN  treatment plans  . Assessed frequency of [facility] home visits and reviewed latest faxed notes . Reviewed medications with patient and discussed medication taking behavior . Discussed plans with patient for ongoing care management follow up and provided patient with direct contact information for care management team . Reviewed weight gain parameters indicating need for  provider notification  . Reviewed role of diuretics in prevention of fluid overload and management of heart failure . Called her pharmacy and verified they called patient, not the clinic staff ,as she has no refills on her lasix . Provided positive reinforcement for patient's excellent disease self management skills . Reviewed scheduled/upcoming provider appointments  . Advised patient to have son [name] call this CCM RN on Friday morning 11/04/20 to arrange to pick up chocolate nutritional  supplement  Patient Self Care Activities:  . Takes Heart Failure Medications as prescribed . Weighs daily and record (notifying MD of 3 lb weight gain over night or 5 lb in a week) . Verbalizes understanding of and follows CHF Action Plan . Adheres to low sodium diet, heart healthy diet . - follows rescue plan if symptoms flare-up . - knows when to call the doctor . - tracks symptoms and what helps feel better or worse . - dresses right for the weather, hot or cold  Patient Care Plan: CCM RN Asthma (Adult)    Problem Identified: Symptom Exacerbation (Asthma)   Priority: High  Onset Date: 08/12/2020    Long-Range Goal: Symptom Exacerbation Prevented or Minimized   Start Date: 08/12/2020  This Visit's Progress: On track  Recent Progress: On track  Priority: High  Note:   CARE PLAN ENTRY (see longitudinal plan of care for additional care plan information)  Current Barriers:    [name] Knowledge deficits related to basic asthma self care/management- patient denies respiratory issues at present    Case Manager Clinical Goal(s):  Over the next 30 days, patient will not be hospitalized for asthma exacerbation    Interventions:    Review definition of asthma- airways are more sensitive, common sxs- cough,chest tightness, SOB, wheezing,low energy  Review Sxs of asthma flare- coughing especially at night, out of breath more easily, wheezing,chest tightness, fast breathing at rest  Review sxs of severe flare- severe difficulty breathing, too short of breath to speak a full sentence or walk across a room, lips or fingers turning blue, feeling like you are going to pass out  Assessed patient's knowledge of asthma triggers and educated patient on how to avoid triggers- I.e.- wear mask when dusting,sweeping or doing yard work, use pump instead of aerosol bottles, don't use bleach or ammonia, use exhaust fans when cooking, set car air conditioning to recirculate so less pollution gets in  Assessed patient's knowledge of asthma medications and ability to distinguish between rescue medications and controller medications and provided appropriate education, advise to carry rescue meds at all times  Controlling triggers-  get early treatment for colds or URIs, wear scarf around your face in cold weather  Teach patient that asthma is not in good control if using rescue inhaler more than twice weekly or if wake up at night with sxs more than 2 times per month  Treat GERD since can cause coughing, increase airway inflammation, and trigger flare ups  Lose weight- excess weight puts stress on airways  Reinforce the need for taking controlled medications as indicated  Provided instruction about proper use of medications used for management of asthma including inhalers  Discuss the importance of pneumonia and flu vaccine  Reinforce what to do in an asthma attack  Patient Self  Care Activities:  . Patient verbalizes understanding of plan to mange asthma to prevent hospitalization . Self administers medications as prescribed . Attends all scheduled provider appointments . Calls pharmacy for medication refills . Calls provider office for new concerns or questions        The patient verbalized understanding of instructions, educational materials, and care plan provided today and declined offer to receive copy of patient instructions, educational materials, and care plan.   The care management team will reach out to the patient again over the next 30-60 days.   Kelli Churn RN, CCM, [facility] RN Care Manager [phone]

## 2020-11-01 NOTE — Chronic Care Management (AMB) (Addendum)
Care Management    RN Visit Note  11/01/2020 Name: Erika [name] MRN: [id number] DOB: [birth year]  Subjective: Erika [name] is a 80 y.o. year old female who is a primary care patient of Erika Falcon, MD. The care management team was consulted for assistance with disease management and care coordination needs.    Engaged with patient by telephone for follow up visit in response to provider referral for case management and/or care coordination services.   Consent to Services:   Erika [name] was given information about Care Management services today including:  1. Care Management services includes personalized support from designated clinical staff supervised by her physician, including individualized plan of care and coordination with other care providers 2. 24/7 contact phone numbers for assistance for urgent and routine care needs. 3. The patient may stop case management services at any time by phone call to the office staff.  Patient agreed to services and consent obtained.   Assessment: Review of patient past medical history, allergies, medications, health status, including review of consultants reports, laboratory and other test data, was performed as part of comprehensive evaluation and provision of chronic care management services.   SDOH (Social Determinants of Health) assessments and interventions performed:    Care Plan  Allergies  Allergen Reactions  . Prednisone Other (See Comments)    REACTION: Psychosis, talking out of head, insomnia  . Seroquel [Quetiapine Fumarate] Nausea Only and Other (See Comments)    Caused very dry mouth    Outpatient Encounter Medications as of 11/01/2020  Medication Sig Note  . ADVAIR DISKUS 500-50 MCG/DOSE AEPB Inhale 1 puff into the lungs 2 (two) times daily.   [name] albuterol (VENTOLIN HFA) 108 (90 Base) MCG/ACT inhaler INHALE 1-2 PUFFS INTO THE LUNGS EVERY 6 HOURS AS NEEDED FOR WHEEZING OR FOR SHORTNESS OF BREATH   . allopurinol  (ZYLOPRIM) 300 MG tablet Take 0.5 tablets (150 mg total) by mouth 2 (two) times daily.   [name] amLODipine (NORVASC) 10 MG tablet Take 0.5 tablets (5 mg total) by mouth daily.   [name] apixaban (ELIQUIS) 5 MG TABS tablet Take 1 tablet (5 mg total) by mouth 2 (two) times daily.   [name] aspirin EC 81 MG tablet Take 1 tablet (81 mg total) by mouth daily.   . Ensure Max Protein (ENSURE MAX PROTEIN) LIQD Take 330 mLs (11 oz total) by mouth 2 (two) times daily. (Patient taking differently: Take 11 oz by mouth 2 (two) times daily. She drinks regular Ensure not Ensure Max and she prefers chocolate) 12/01/2019: Will contact [employer] rep for coupons and/or free samples  . fluticasone (FLONASE) 50 MCG/ACT nasal spray SHAKE LIQUID AND INSTILL ONE (1) SPRAY IN EACH NOSTRIL ONCE DAILY   . furosemide (LASIX) 40 MG tablet Take 1 tablet (40 mg total) by mouth daily.   . metoprolol tartrate (LOPRESSOR) 50 MG tablet TAKE 1 TABLET BY MOUTH TWICE DAILY   . NYAMYC powder APPLY TOPICALLY TO AFFECTED AREAS TWICE DAILY   . omeprazole (PRILOSEC) 20 MG capsule TAKE 1 CAPSULE BY MOUTH DAILY   . PEG-KCl-NaCl-NaSulf-Na Asc-C (PLENVU) 140 g SOLR Take 140 g by mouth as directed. Manufacturer's coupon Universal coupon code:BIN: P2366821; GROUP: [id number]; PCN: [id number]; ID: [id number]; PAY NO MORE $50   . potassium chloride SA (KLOR-CON) 20 MEQ tablet TAKE 1/2 TABLET BY MOUTH DAILY   . pravastatin (PRAVACHOL) 20 MG tablet Take 1 tablet (20 mg total) by mouth daily.   . sodium chloride (OCEAN) 0.65 % SOLN  nasal spray Place 1 spray into both nostrils as needed for congestion.    No facility-administered encounter medications on file as of 11/01/2020.    Patient Active Problem List   Diagnosis Date Noted  . Dry mouth 10/30/2019  . Chronic heart failure with preserved ejection fraction (HFpEF) ([place]) 09/09/2019  . Aortic stenosis 08/22/2019  . General weakness   . Tumor lysis syndrome   . Hyperuricemia   . Thrombocytopenia ([place])   .  Myeloproliferative disorder ([place]) 07/10/2019  . AKI (acute kidney injury) ([place]) 07/10/2019  . AF (paroxysmal atrial fibrillation) ([place]) 06/02/2017  . Leukocytosis 05/29/2017  . Anemia 05/29/2017  . Leg swelling 02/23/2016  . Hand cramps 11/22/2015  . GERD (gastroesophageal reflux disease) 02/09/2013  . OA (osteoarthritis) of knee 01/29/2012  . OA (osteoarthritis of spine) 10/18/2011  . Healthcare maintenance 04/25/2011  . Hypokalemia 02/26/2008  . Moderate persistent asthma 02/10/2008  . Hyperlipidemia 08/04/2007  . H/O Herpes zoster 08/29/2006  . Obesity, Class III, BMI 40-49.9 (morbid obesity) ([place]) 08/29/2006  . History of breast cyst 08/29/2006  . Essential hypertension 08/27/2006  . 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    Conditions to be addressed/monitored: HF, HTN, AFib, Asthma, myeloproliferative neoplasm  Care Plan : CCM RN- Heart Failure (Adult)  Updates made by Erika Ellison, RN since 11/01/2020 12:00 AM    Problem: Symptom Exacerbation (Heart Failure)   Priority: High  Onset Date: 10/13/2019    Long-Range Goal: Symptom Exacerbation Prevented or Minimized   Start Date: 10/13/2019  Recent Progress: On track  Priority: High  Note:   ARE PLAN ENTRY (see longitudinal plan of care for additional care plan information)   Current Barriers:  [name] Knowledge deficit related to basic heart failure pathophysiology and self care management- successful outreach to patient to complete follow up assessment, patient states she is doing well, did not require a blood transfusion when labs were drawn on 10/28/20 at the cancer center, she denies dyspnea or overall energy levels different from her baseline, chronic lower extremity edema at baseline,  she says she does not elevate her legs when sitting as she does not have a recliner. She denies heart palpitations or heart racing, she continues to voice  good understanding of her treatment plan for HF and, HTN a fib, she says she continues to weigh herself daily and record her weights in the [facility] calendar this CCM RN mailed to her. She says she continues to receive home visits from [facility] staff as part of their home based primary care program. She says she continues to drink chocolate Ensure and would appreciate any samples she can get. She says her son [name] picked up the samples last week that were left for her at the cancer center by this CCM RN on 09/02/20  Case Manager Clinical Goal(s):  [name] Over the next 30 - 60 days, patient will weigh self daily and record- met . Over the next 30 - 60 days, patient will verbalize understanding of Heart Failure Action Plan and when to call doctor- met . Over the next 30 - 60 days, patient will take all Heart Failure mediations as prescribed . Over the next 30 - 60 days, patient will weigh daily and record (notifying MD of 3 lb weight gain over night or 5 lb in a week)-   Interventions:  . Reviewed Heart Failure a fib and HTN  treatment  plans  . Assessed frequency of [facility] home visits and reviewed latest faxed notes . Reviewed medications with patient and discussed medication taking behavior . Discussed plans with patient for ongoing care management follow up and provided patient with direct contact information for care management team . Reviewed weight gain parameters indicating need for  provider notification  . Reviewed role of diuretics in prevention of fluid overload and management of heart failure . Provided positive reinforcement for patient's excellent disease self management skills . Reviewed scheduled/upcoming provider appointments with oncologist 12/30/20 . Reminded patient she will hold Eliquis per MD direction before she has colonoscopy in June . Advised patient to have son [name] call this CCM RN on Friday morning 11/04/20 to arrange to pick up  chocolate nutritional  supplement  Patient Self Care Activities:  . Takes Heart Failure Medications as prescribed . Weighs daily and record (notifying MD of 3 lb weight gain over night or 5 lb in a week) . Verbalizes understanding of and follows CHF Action Plan . Adheres to low sodium diet, heart healthy diet . - follows rescue plan if symptoms flare-up . - knows when to call the doctor . - tracks symptoms and what helps feel better or worse . - dresses right for the weather, hot or cold      Plan: The care management team will reach out to the patient again over the next 30-60 days.  Kelli Churn RN, CCM, [facility] RN Care Manager [phone]

## 2020-11-03 ENCOUNTER — Other Ambulatory Visit: Payer: Self-pay

## 2020-11-03 MED ORDER — ASPIRIN EC 81 MG PO TBEC: 81.0000 mg | DELAYED_RELEASE_TABLET | Freq: Every day | ORAL | 3 refills | Status: AC

## 2020-11-03 NOTE — Telephone Encounter (Signed)
Called pt again, no answer -left message to call the office.

## 2020-11-03 NOTE — Telephone Encounter (Signed)
Can you see if Ms. Lares can do a telehealth tomorrow?  Double book with one of my other patients and I will call her while I have the medical student see someone in person.   Thanks!

## 2020-11-03 NOTE — Telephone Encounter (Signed)
Called pt - no answer; left message to call the office . 

## 2020-11-10 NOTE — Telephone Encounter (Signed)
Multiple attempts to reach patient by phone with no contact.  I have mailed her a letter to call the office

## 2020-11-29 ENCOUNTER — Ambulatory Visit: Payer: PPO | Admitting: *Deleted

## 2020-11-29 DIAGNOSIS — I5032 Chronic diastolic (congestive) heart failure: Secondary | ICD-10-CM

## 2020-11-29 DIAGNOSIS — I480 Paroxysmal atrial fibrillation: Secondary | ICD-10-CM

## 2020-11-29 DIAGNOSIS — I10 Essential (primary) hypertension: Secondary | ICD-10-CM

## 2020-11-29 DIAGNOSIS — D471 Chronic myeloproliferative disease: Secondary | ICD-10-CM

## 2020-11-29 DIAGNOSIS — M7989 Other specified soft tissue disorders: Secondary | ICD-10-CM

## 2020-11-29 DIAGNOSIS — J4540 Moderate persistent asthma, uncomplicated: Secondary | ICD-10-CM

## 2020-11-29 NOTE — Patient Instructions (Signed)
Visit Information It was nice speaking with you today. Goals Addressed            This Visit's Progress   . Track and Manage My Symptoms-Asthma       Timeframe:  Long-Range Goal Priority:  High Start Date:        08/12/20                     Expected End Date:    ongoing                   Follow Up Date 12/27/20   - avoid symptom triggers outdoors - eliminate symptom triggers at home - follow asthma action plan - keep follow-up appointments - keep rescue medicines on hand    Why is this important?    Keeping track of asthma symptoms can tell you a lot about your asthma control.   Based on symptoms and peak flow results you can see how well you are doing.   Your asthma action plan has a green, yellow and red zone. Green means all is good; it is your goal. Yellow means your symptoms are a little worse. You will need to adjust your medications. Being in the red zone means that your    asthma is out of control. You will need to use your rescue medicines. You may need emergency care.     Notes:  meeting goal    . Track and Manage Symptoms-Heart Failure       Timeframe:  Long-Range Goal Priority:  High Start Date:         08/12/20                    Expected End Date:   ongoing                    Follow Up Date 12/27/20   - follow rescue plan if symptoms flare-up - know when to call the doctor - track symptoms and what helps feel better or worse - dress right for the weather, hot or cold    Why is this important?    You will be able to handle your symptoms better if you keep track of them.   Making some simple changes to your lifestyle will help.   Eating healthy is one thing you can do to take good care of yourself.    Notes: Meeting goal       The patient verbalized understanding of instructions, educational materials, and care plan provided today and declined offer to receive copy of patient instructions, educational materials, and care plan.   The care management  team will reach out to the patient again over the next 30-60 days.   Kelli Churn RN, CCM, [facility] RN Care Manager [phone]

## 2020-11-29 NOTE — Chronic Care Management (AMB) (Signed)
Care Management    RN Visit Note  11/29/2020 Name: Erika [name] MRN: [id number] DOB: 04-Jun-1941  Subjective: Erika [name] is a 80 y.o. year old female who is a primary care patient of [name], MD. The care management team was consulted for assistance with disease management and care coordination needs.    Engaged with patient by telephone for follow up visit in response to provider referral for case management and/or care coordination services.   Consent to Services:   [name] was given information about Care Management services today including:  1. Care Management services includes personalized support from designated clinical staff supervised by her physician, including individualized plan of care and coordination with other care providers 2. 24/7 contact phone numbers for assistance for urgent and routine care needs. 3. The patient may stop case management services at any time by phone call to the office staff.  Patient agreed to services and consent obtained.   Assessment: Review of patient past medical history, allergies, medications, health status, including review of consultants reports, laboratory and other test data, was performed as part of comprehensive evaluation and provision of chronic care management services.   SDOH (Social Determinants of Health) assessments and interventions performed:    Care Plan  Allergies  Allergen Reactions  . Prednisone Other (See Comments)    REACTION: Psychosis, talking out of head, insomnia  . Seroquel [Quetiapine Fumarate] Nausea Only and Other (See Comments)    Caused very dry mouth    Outpatient Encounter Medications as of 11/29/2020  Medication Sig Note  . ADVAIR DISKUS 500-50 MCG/DOSE AEPB Inhale 1 puff into the lungs 2 (two) times daily.   [name] albuterol (VENTOLIN HFA) 108 (90 Base) MCG/ACT inhaler INHALE 1-2 PUFFS INTO THE LUNGS EVERY 6 HOURS AS NEEDED FOR WHEEZING OR FOR SHORTNESS OF BREATH   . allopurinol  (ZYLOPRIM) 300 MG tablet Take 0.5 tablets (150 mg total) by mouth 2 (two) times daily.   [name] amLODipine (NORVASC) 10 MG tablet Take 0.5 tablets (5 mg total) by mouth daily.   [name] apixaban (ELIQUIS) 5 MG TABS tablet Take 1 tablet (5 mg total) by mouth 2 (two) times daily.   [name] aspirin EC 81 MG tablet Take 1 tablet (81 mg total) by mouth daily.   . Ensure Max Protein (ENSURE MAX PROTEIN) LIQD Take 330 mLs (11 oz total) by mouth 2 (two) times daily. (Patient taking differently: Take 11 oz by mouth 2 (two) times daily. She drinks regular Ensure not Ensure Max and she prefers chocolate) 12/01/2019: Will contact [employer] rep for coupons and/or free samples  . fluticasone (FLONASE) 50 MCG/ACT nasal spray SHAKE LIQUID AND INSTILL ONE (1) SPRAY IN EACH NOSTRIL ONCE DAILY   . furosemide (LASIX) 40 MG tablet Take 1 tablet (40 mg total) by mouth daily.   . metoprolol tartrate (LOPRESSOR) 50 MG tablet TAKE 1 TABLET BY MOUTH TWICE DAILY   . NYAMYC powder APPLY TOPICALLY TO AFFECTED AREAS TWICE DAILY   . omeprazole (PRILOSEC) 20 MG capsule TAKE 1 CAPSULE BY MOUTH DAILY   . PEG-KCl-NaCl-NaSulf-Na Asc-C (PLENVU) 140 g SOLR Take 140 g by mouth as directed. Manufacturer's coupon Universal coupon code:BIN: P2366821; GROUP: [id number]; PCN: [id number]; ID: [id number]; PAY NO MORE $50   . potassium chloride SA (KLOR-CON) 20 MEQ tablet TAKE 1/2 TABLET BY MOUTH DAILY   . pravastatin (PRAVACHOL) 20 MG tablet Take 1 tablet (20 mg total) by mouth daily.   . sodium chloride (OCEAN) 0.65 % SOLN  nasal spray Place 1 spray into both nostrils as needed for congestion.    No facility-administered encounter medications on file as of 11/29/2020.    Patient Active Problem List   Diagnosis Date Noted  . Dry mouth 10/30/2019  . Chronic heart failure with preserved ejection fraction (HFpEF) ([place]) 09/09/2019  . Aortic stenosis 08/22/2019  . General weakness   . Tumor lysis syndrome   . Hyperuricemia   . Thrombocytopenia ([place])   .  Myeloproliferative disorder ([place]) 07/10/2019  . AKI (acute kidney injury) ([place]) 07/10/2019  . AF (paroxysmal atrial fibrillation) ([place]) 06/02/2017  . Leukocytosis 05/29/2017  . Anemia 05/29/2017  . Leg swelling 02/23/2016  . Hand cramps 11/22/2015  . GERD (gastroesophageal reflux disease) 02/09/2013  . OA (osteoarthritis) of knee 01/29/2012  . OA (osteoarthritis of spine) 10/18/2011  . Healthcare maintenance 04/25/2011  . Hypokalemia 02/26/2008  . Moderate persistent asthma 02/10/2008  . Hyperlipidemia 08/04/2007  . H/O Herpes zoster 08/29/2006  . Obesity, Class III, BMI 40-49.9 (morbid obesity) ([place]) 08/29/2006  . History of breast cyst 08/29/2006  . Essential hypertension 08/27/2006  . 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    Conditions to be addressed/monitored: HF, HTN, AFib, Asthma, myeloproliferative neoplasm  Care Plan : CCM RN- Heart Failure (Adult)  Updates made by Barrington Ellison, RN since 11/29/2020 12:00 AM    Problem: Symptom Exacerbation (Heart Failure)   Priority: High  Onset Date: 10/13/2019    Long-Range Goal: Symptom Exacerbation Prevented or Minimized   Start Date: 10/13/2019  Recent Progress: On track  Priority: High  Note:   ARE PLAN ENTRY (see longitudinal plan of care for additional care plan information)   Current Barriers:  [name] Knowledge deficit related to basic heart failure pathophysiology and self care management- successful outreach to patient to complete follow up assessment, patient states she is doing well, did not require a blood transfusion when labs were drawn on 10/28/20 at the cancer center, she denies dyspnea or overall energy levels different from her baseline, chronic lower extremity edema at baseline,  she says she does not elevate her legs when sitting as she does not have a recliner. She denies heart palpitations or heart racing, she continues to voice  good understanding of her treatment plan for HF and, HTN a fib, she says she continues to weigh herself daily and record her weights in the [facility] calendar this CCM RN mailed to her. She says she was told by the [facility] staff they will no longer be providing services in the home. She says she has been trying to make a clinic appointment to discuss stopping her Eliquis in preparation for her colonoscopy in June and so she can have a tooth pulled; she is asking for this CCM RN's assistance in securing the appointment.  She says she continues to drink chocolate Ensure and would appreciate any samples she can get. She says she is willing to try chocolate Glucerna if samples area available and they can be dropped off at the cancer center and her son [name] will pick them up.   Case Manager Clinical Goal(s):  [name] Over the next 30 - 60 days, patient will weigh self daily and record- met . Over the next 30 - 60 days, patient will verbalize understanding of Heart Failure Action Plan and when to call doctor- met . Over the next 30 - 60 days, patient will take  all Heart Failure mediations as prescribed . Over the next 30 - 60 days, patient will weigh daily and record (notifying MD of 3 lb weight gain over night or 5 lb in a week)-   Interventions:  . Reviewed Heart Failure a fib and HTN  treatment plans  . Reviewed medications with patient and discussed medication taking behavior . Discussed plans with patient for ongoing care management follow up and provided patient with direct contact information for care management team . Reviewed weight gain parameters indicating need for  provider notification  . Reviewed role of diuretics in prevention of fluid overload and management of heart failure . Provided positive reinforcement for patient's excellent disease self management skills . Will message patient's clinic provider and front desk staff that the best time to reach her is  between 10-11 am  . Reviewed scheduled/upcoming provider appointments with oncologist on 12/30/20 . Reminded patient she will hold Eliquis per MD direction before she has colonoscopy in June . Will plan to deliver chocolate nutritional supplement to cancer center for her May 27th appt  Patient Self Care Activities:  . Takes Heart Failure Medications as prescribed . Weighs daily and record (notifying MD of 3 lb weight gain over night or 5 lb in a week) . Verbalizes understanding of and follows CHF Action Plan . Adheres to low sodium diet, heart healthy diet . - follows rescue plan if symptoms flare-up . - knows when to call the doctor . - tracks symptoms and what helps feel better or worse . - dresses right for the weather, hot or cold    Care Plan : CCM RN Asthma (Adult)  Updates made by Barrington Ellison, RN since 11/29/2020 12:00 AM    Problem: Symptom Exacerbation (Asthma)   Priority: High  Onset Date: 08/12/2020    Long-Range Goal: Symptom Exacerbation Prevented or Minimized   Start Date: 08/12/2020  Recent Progress: On track  Priority: High  Note:   CARE PLAN ENTRY (see longitudinal plan of care for additional care plan information)  Current Barriers:   [name] Knowledge deficits related to basic asthma self care/management- patient denies respiratory issues at present- patient denies recent asthma exacerbations despite high pollen count    Case Manager Clinical Goal(s):  Over the next 30 days, patient will not be hospitalized for asthma exacerbation    Interventions:    Review definition of asthma- airways are more sensitive, common sxs- cough,chest tightness, SOB, wheezing,low energy  Review Sxs of asthma flare- coughing especially at night, out of breath more easily, wheezing,chest tightness, fast breathing at rest  Review sxs of severe flare- severe difficulty breathing, too short of breath to speak a full sentence or walk across a room, lips or fingers turning blue, feeling  like you are going to pass out  Assessed patient's knowledge of asthma triggers and educated patient on how to avoid triggers- I.e.- wear mask when dusting,sweeping or doing yard work, use pump instead of aerosol bottles, don't use bleach or ammonia, use exhaust fans when cooking, set car air conditioning to recirculate so less pollution gets in  Assessed patient's knowledge of asthma medications and ability to distinguish between rescue medications and controller medications and provided appropriate education, advise to carry rescue meds at all times  Controlling triggers-  get early treatment for colds or URIs, wear scarf around your face in cold weather  Teach patient that asthma is not in good control if using rescue inhaler more than twice weekly or if wake  up at night with sxs more than 2 times per month  Treat GERD since can cause coughing, increase airway inflammation, and trigger flare ups  Lose weight- excess weight puts stress on airways  Reinforce the need for taking controlled medications as indicated  Provided instruction about proper use of medications used for management of asthma including inhalers  Discuss the importance of pneumonia and flu vaccine  Reinforce what to do in an asthma attack  Patient Self Care Activities:  . Patient verbalizes understanding of plan to mange asthma to prevent hospitalization . Self administers medications as prescribed . Attends all scheduled provider appointments . Calls pharmacy for medication refills . Calls provider office for new concerns or questions        Plan: The care management team will reach out to the patient again over the next 30-60 days.  Kelli Churn RN, CCM, [facility] RN Care Manager [phone]

## 2020-12-07 ENCOUNTER — Other Ambulatory Visit: Payer: Self-pay | Admitting: *Deleted

## 2020-12-07 MED ORDER — POTASSIUM CHLORIDE CRYS ER 20 MEQ PO TBCR: 10.0000 meq | EXTENDED_RELEASE_TABLET | Freq: Every day | ORAL | 1 refills | Status: DC

## 2020-12-07 MED ORDER — ALLOPURINOL 300 MG PO TABS
150.0000 mg | ORAL_TABLET | Freq: Two times a day (BID) | ORAL | 3 refills | Status: AC
Start: 2020-12-07 — End: ?

## 2020-12-07 NOTE — Telephone Encounter (Signed)
From [place]:   Called patient in reference to message Erika [name] sent.  Patient's colonoscopy is not until June and medical records has not received any paperwork on this patient.  Asked her to get in contact with the doctor who will be performing the procedure and asked them to please fax [place] the necessary paperwork.  During our conversation, patient states she is did not receive two of her medicines from the pharmacy, potassium and her medication for gout.  Forwarding to triage

## 2020-12-07 NOTE — Telephone Encounter (Signed)
-----   Message from Meriam Sprague sent at 12/07/2020 11:28 AM EDT -----   ----- Message ----- From: Barrington Ellison, RN Sent: 11/29/2020   5:39 PM EDT To: [name], MD, [place]  Patient says she has been trying to get though to the clinic to make an appt with Dr Daryll Drown to discuss stopping Eliquis so she can have colonoscopy and a tooth extracted.  She says the best time to call her is between 10-11 am. Thanks, Marcie Bal

## 2020-12-07 NOTE — Progress Notes (Signed)
Called patient in reference to message Kelli Churn sent.  Patient's colonoscopy is not until June and medical records has not received any paperwork on this patient.  Asked her to get in contact with the doctor who will be performing the procedure and asked them to please fax [place] the necessary paperwork.  During our conversation, patient states she is did not receive two of her medicines from the pharmacy, potassium and her medication for gout.  Forwarding to triage.

## 2020-12-29 ENCOUNTER — Other Ambulatory Visit: Payer: Self-pay

## 2020-12-29 DIAGNOSIS — D471 Chronic myeloproliferative disease: Secondary | ICD-10-CM

## 2020-12-29 NOTE — Progress Notes (Signed)
HEMATOLOGY/ONCOLOGY CLINIC NOTE  Date of Service: 12/30/20    Inpatient Attending: .Brunetta Genera, MD  CC:  F/u MPN NOS chek2 mutation +ve  HPI  Patient is a very pleasant 80 year old female with a history of morbid obesity, anemia, asthma, hypertension, lumbar spinal TB in 2002 who presented with acute onset of lower back pain on Monday. She reports that she drank some pepsi and tea on that day and her back pain began shortly after. No initial injury to precipitate her back pain. It gradually worsened until she was seen in the ED on 05/30/17. Labs during admission revealed WBC count of 55.2k with predominantly neutrophilia of 48.8k and some monocytosis at 2.2k.   She had minimal anemia at 11.5 with an MCV of 92 and a normal platelet count of 163k.  At that time she had CT A/P while in the ED which was normal and without splenomegaly. Additionally, she also had MRI of the L-spine which only showed degenerative changes. She was subsequently admitted for further workup and pain control and to rule out a source of infection.   Of note, the patient has a remote prior h/o epidural mass in 2002 (hemilaminectomy and decompression of tumor in epidural space 2002) which was thought to be secondary to lumbar epidural tuberculosis. She was placed on a course of antibiotics for several months following her diagnosis.   Following her admission, we were consulted for further workup. No recent new medications. She reports that her inhaler does contain steroids, but otherwise she has not been on a course of steroids recently. She is UTD with her regular cancer screenings. She does not feel any different now than she did 43moto a year ago.  No focal symptoms suggestive of infection other than the back pain at this time.  On ROS, she denies fever, chills, night sweats, unexpected weight loss, abdominal pain, sore throat, joint swelling, or any other associated symptoms. Her back pain is much improved  with muscle relaxants. She does note some right great toe pain, but otherwise she has no other acute symptoms.   INTERVAL HISTORY:   MJAMIYA NIMSis here for follow up of persistent leucocytosis/neutrophilia consistent with chronic MPN NOS with Chek2 mutation. The patient's last visit with [facility] on 09/02/2020. The pt reports that she is doing well overall.  The pt reports that she has been slowing down lately. The pt notes more fatigue recently. She notes that they are wanting to do a colonoscopy but would need to hold her Eliquis for this. She is on that for her Afib.   Lab results today 12/30/2020 of CBC w/diff and CMP is as follows: all values are WNL except for WBC of 99.0K, RBC of 2.47, Hgb of 7.5, HCT of 23.8, RDW of 17.2, Plt of 120K, Neutro Abs of 86.7K, Monocytes Abs of 2.3K, BUN of 28, Creatinine of 1.37, Alkaline Phosphatase of 410, GFR est of 40.  On review of systems, pt reports fatigue, leg swelling and denies infection issues, fevers, chills, drenching night sweats, decreased appetite, sudden weight loss, new bone pains, abdominal pain, change in bowel habits, abnormal bleeding issues, headaches, and any other symptoms.  MEDICAL HISTORY:  Past Medical History:  Diagnosis Date  . Anemia    with menses  . Asthma   . [place]   . Back pain    status post surgery 2002  . Breast cyst    Excesion with FNA, begnin in 2004.   .[name]  Degenerative joint disease of spine    Imaging 2005,  Degenerative hypertrophic facet arthritis changes L4-5 and L5-S1..   . History of shingles    Recurrent with post herpetic neuralgia.   [name] Hypertension   . Lymphadenopathy    Of the mediastinum, Right side CXR 2008, not read on 2010 cxr.   . Menopause   . Obesity    BMI 54  . Psychosis ([place])    Secondary to prednisone.  . Shingles   . Stasis dermatitis    W/ LE edema, prviously on lasix now on mazxide.   . SVT (supraventricular tachycardia) [facility]) June 2009   one run  while hospitalized  . Tuberculosis    active TB treated in 2002, hx of paraspinal lumbar TB,     SURGICAL HISTORY: Past Surgical History:  Procedure Laterality Date  . BREAST CYST ASPIRATION Left   . Breast cyst biopsy    . CHOLECYSTECTOMY  02/02/2012   Procedure: LAPAROSCOPIC CHOLECYSTECTOMY;  Surgeon: Zenovia Jarred, MD;  Location: [place];  Service: General;  Laterality: N/A;  . Hemilaminectomy of L4, L5 and S1 decompression of tumor in epidural space  2000  . IR FLUORO GUIDED NEEDLE PLC ASPIRATION/INJECTION LOC  07/17/2019    SOCIAL HISTORY: Social History   Socioeconomic History  . Marital status: Widowed    Spouse name: Not on file  . Number of children: Not on file  . Years of education: 11 th grade  . Highest education level: Not on file  Occupational History  . 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 Smoking status: Never Smoker  . Smokeless tobacco: Never Used  Vaping Use  . Vaping Use: Never used  Substance and Sexual Activity  . Alcohol use: No    Alcohol/week: 0.0 standard drinks  . Drug use: No  . Sexual activity: Not Currently    Birth control/protection: Post-menopausal  Other Topics Concern  . Not on file  Social History Narrative   Current Social History 06/09/2020      Patient lives alone in a ground floor apartment which is 1 story. There are not steps up to the entrance the patient uses. One son stays with her once in a while.      Patient's method of transportation is personal car.      The highest level of education was some high school: 11th grade.      The patient currently retired.      Identified important Relationships are "My friend Mart Piggs, my nieces, my grandkids, a lot of people.       Pets : None       Interests / Fun: "Playing word games on my phone, watch TV, Talking on phone."       Current Stressors: "Nothing, really. I try not to let anything stress me."        Religious / Personal Beliefs: "I believe in God."       L. Ducatte, BSN, RN-BC       Social Determinants of Health   Financial Resource Strain: Not on file  Food Insecurity: Not on file  Transportation Needs: Not on file  Physical Activity: Not on file  Stress: Not on file  Social Connections: Not on file  Intimate Partner Violence: Not on file    FAMILY HISTORY: Family History  Problem Relation Age of Onset  . Stroke Mother  at young age  . Heart disease Mother   . Heart attack Father   . Heart disease Father   . Cancer Sister        Unknown  . Stroke Brother   . Stroke Brother   . Breast cancer Other   . Sickle cell anemia Brother   . Diabetes Brother   . Stroke Brother   . Stroke Son   . Colon cancer Neg Hx   . Esophageal cancer Neg Hx   . Pancreatic cancer Neg Hx   . Stomach cancer Neg Hx   . Liver disease Neg Hx     ALLERGIES:  is allergic to prednisone and seroquel [quetiapine fumarate].  MEDICATIONS:  . Current Outpatient Medications:  .  ADVAIR DISKUS 500-50 MCG/DOSE AEPB, Inhale 1 puff into the lungs 2 (two) times daily., Disp: 180 each, Rfl: 3 .  albuterol (VENTOLIN HFA) 108 (90 Base) MCG/ACT inhaler, INHALE 1-2 PUFFS INTO THE LUNGS EVERY 6 HOURS AS NEEDED FOR WHEEZING OR FOR SHORTNESS OF BREATH, Disp: 8.5 g, Rfl: 11 .  allopurinol (ZYLOPRIM) 300 MG tablet, Take 0.5 tablets (150 mg total) by mouth 2 (two) times daily., Disp: 90 tablet, Rfl: 3 .  amLODipine (NORVASC) 10 MG tablet, Take 0.5 tablets (5 mg total) by mouth daily., Disp: 90 tablet, Rfl: 3 .  apixaban (ELIQUIS) 5 MG TABS tablet, Take 1 tablet (5 mg total) by mouth 2 (two) times daily., Disp: 60 tablet, Rfl: 11 .  aspirin EC 81 MG tablet, Take 1 tablet (81 mg total) by mouth daily., Disp: 90 tablet, Rfl: 3 .  Ensure Max Protein (ENSURE MAX PROTEIN) LIQD, Take 330 mLs (11 oz total) by mouth 2 (two) times daily. (Patient taking differently: Take 11 oz by mouth 2 (two) times daily. She  drinks regular Ensure not Ensure Max and she prefers chocolate), Disp:  , Rfl:  .  fluticasone (FLONASE) 50 MCG/ACT nasal spray, SHAKE LIQUID AND INSTILL ONE (1) SPRAY IN EACH NOSTRIL ONCE DAILY, Disp: 16 g, Rfl: 4 .  furosemide (LASIX) 40 MG tablet, Take 1 tablet (40 mg total) by mouth daily., Disp: 90 tablet, Rfl: 3 .  metoprolol tartrate (LOPRESSOR) 50 MG tablet, TAKE 1 TABLET BY MOUTH TWICE DAILY, Disp: 60 tablet, Rfl: 11 .  NYAMYC powder, APPLY TOPICALLY TO AFFECTED AREAS TWICE DAILY, Disp: 15 g, Rfl: 3 .  omeprazole (PRILOSEC) 20 MG capsule, TAKE 1 CAPSULE BY MOUTH DAILY, Disp: 30 capsule, Rfl: 3 .  PEG-KCl-NaCl-NaSulf-Na Asc-C (PLENVU) 140 g SOLR, Take 140 g by mouth as directed. Manufacturer's coupon Universal coupon code:BIN: P2366821; GROUP: TK35465681; PCN: [id number]; ID: [id number]; PAY NO MORE $50, Disp: 1 each, Rfl: 0 .  potassium chloride SA (KLOR-CON) 20 MEQ tablet, Take 0.5 tablets (10 mEq total) by mouth daily., Disp: 15 tablet, Rfl: 1 .  pravastatin (PRAVACHOL) 20 MG tablet, Take 1 tablet (20 mg total) by mouth daily., Disp: 90 tablet, Rfl: 3 .  sodium chloride (OCEAN) 0.65 % SOLN nasal spray, Place 1 spray into both nostrils as needed for congestion., Disp: 88 mL, Rfl: 0 No current facility-administered medications for this visit.  Facility-Administered Medications Ordered in Other Visits:  .  0.9 %  sodium chloride infusion (Manually program via Guardrails IV Fluids), 250 mL, Intravenous, Once, Brunetta Genera, MD .  acetaminophen (TYLENOL) tablet 650 mg, 650 mg, Oral, Once, Irene Limbo, Cloria Spring, MD   REVIEW OF SYSTEMS:   10 Point review of Systems was done is negative except as noted above.  PHYSICAL EXAMINATION:  Vitals:   12/30/20 1220  BP: 130/66  Pulse: 80  Resp: 18  Temp: 98.1 F (36.7 C)  TempSrc: Tympanic  SpO2: 98%  Weight: 255 lb 8 oz (115.9 kg)  Height: 5' 7"  (1.702 m)    Exam was given in a wheelchair.   GENERAL:alert, in no acute distress and  comfortable SKIN: no acute rashes, no significant lesions EYES: conjunctiva are pink and non-injected, sclera anicteric OROPHARYNX: MMM, no exudates, no oropharyngeal erythema or ulceration NECK: supple, no JVD LYMPH:  no palpable lymphadenopathy in the cervical, axillary or inguinal regions. Spleen just palpable. LUNGS: clear to auscultation b/l with normal respiratory effort HEART: regular rate & rhythm ABDOMEN:  normoactive bowel sounds , non tender, not distended. Extremity: 2+ pedal edema b/l PSYCH: alert & oriented x 3 with fluent speech NEURO: no focal motor/sensory deficits  LABORATORY DATA:  I have reviewed the data as listed   .       CBC Latest Ref Rng & Units 12/30/2020 10/28/2020 09/02/2020  WBC 4.0 - 10.5 K/uL 99.0(HH) 95.6(HH) 92.9(HH)  Hemoglobin 12.0 - 15.0 g/dL 7.5(L) 8.3(L) 7.9(L)  Hematocrit 36.0 - 46.0 % 23.8(L) 26.5(L) 24.9(L)  Platelets 150 - 400 K/uL 120(L) 140(L) 133(L)   . CBC    Component Value Date/Time   WBC 99.0 (HH) 12/30/2020 1045   WBC 92.9 (HH) 09/02/2020 0843   RBC 2.47 (L) 12/30/2020 1045   HGB 7.5 (L) 12/30/2020 1045   HGB 11.3 (L) 07/24/2017 1402   HCT 23.8 (L) 12/30/2020 1045   HCT 21.9 (L) 06/01/2020 1037   HCT 36.0 07/24/2017 1402   PLT 120 (L) 12/30/2020 1045   PLT 172 07/24/2017 1402   MCV 96.4 12/30/2020 1045   MCV 93.3 07/24/2017 1402   MCH 30.4 12/30/2020 1045   MCHC 31.5 12/30/2020 1045   RDW 17.2 (H) 12/30/2020 1045   RDW 15.6 (H) 07/24/2017 1402   LYMPHSABS 2.6 12/30/2020 1045   LYMPHSABS 2.9 07/24/2017 1402   MONOABS 2.3 (H) 12/30/2020 1045   MONOABS 1.8 (H) 07/24/2017 1402   EOSABS 0.4 12/30/2020 1045   EOSABS 1.1 (H) 07/24/2017 1402   BASOSABS 0.1 12/30/2020 1045   BASOSABS 0.2 (H) 07/24/2017 1402   . CMP Latest Ref Rng & Units 12/30/2020 10/28/2020 09/02/2020  Glucose 70 - 99 mg/dL 85 86 87  BUN 8 - 23 mg/dL 28(H) 30(H) 23  Creatinine 0.44 - 1.00 mg/dL 1.37(H) 1.64(H) 1.38(H)  Sodium 135 - 145 mmol/L 137  138 138  Potassium 3.5 - 5.1 mmol/L 4.1 4.8 4.3  Chloride 98 - 111 mmol/L 101 103 104  CO2 22 - 32 mmol/L 26 25 27   Calcium 8.9 - 10.3 mg/dL 9.3 9.0 8.9  Total Protein 6.5 - 8.1 g/dL 7.8 8.0 7.7  Total Bilirubin 0.3 - 1.2 mg/dL 0.5 0.4 0.4  Alkaline Phos 38 - 126 U/L 410(H) 451(H) 430(H)  AST 15 - 41 U/L 23 21 27   ALT 0 - 44 U/L 9 15 15     RADIOGRAPHIC STUDIES: I have personally reviewed the radiological images as listed and agreed with the findings in the report. No results found.  ASSESSMENT & PLAN:   CARAN STORCK is a delightful, well-appearing 80 y.o. woman with   #1 Leukocytosis?predominantly Neutrophilia with some monocytosis CMML vs MPN NOS with chek 2 mutation Patient initially presented with a WBC count of 55.2k with 48.8k neutrophils.  WBC counts relatively stable today.  Peripheral blood smear personally previously reviewed-shows significant neutrophilia with  left shift.  Increased bands with a few myelocytes and metamyelocytes.  Pelgeroid neutrophils.  No significant increased blasts.  Monocytosis present.  CT abdomen done for workup of her back pain did not show any acute intra-abdominal or pelvic findings. He does not have any overt splenomegaly on her CT abdomen -which is somewhat argues against a chronic myeloproliferative disorder though does not rule this out.  MRI of the lumbar spine shows old surgical and degenerative changes but no evidence of tumor or infectious collections.  Leucocytosis - improved at 34.3k today. No fevers/chills or other constitutional symptoms  Foundation One heme with Chek 2 mutation. No other findings noted.  CT chest on 05/31/2017: IMPRESSION: 1. Mild ground-glass opacities suggest mild pulmonary edema. Small bilateral pleural effusions and basilar atelectasis. No pulmonary infiltrate. Mild subcarinal mediastinal adenopathy is likely reactive. Cardiomegaly.  JAK2 and BCR-ABL mutations neg. LDH levels were within normal limits  and did not suggest a high-grade lymphoma.  06/24/17 BM bx- this showed hypercellular bone marrow with granulocytic proliferation. Cytogenetic analysis was normal.   #2 severe normocytic anemia  #3 CHF/moderate aortic stenosis/paroxysmal atrial flutter on chronic anticoagulation.  #4 bilateral lower extremity swelling left more than right. Ultrasound negative for DVT on 10/22. Cannot rule out venous reflux issues.   PLAN:  -Discussed pt labwork today, 12/30/2020; anemic. WBC stable. Counts and chemistries stable. -Recommended pt receive the second COVID booster shot as recently approved. Advised pt to wait 4-6 months following first booster shot before getting this. -Will proceed with transfusion today as scheduled. -Discussed Evusheld and pt's eligibility. Will send referral. Advised pt to wait around one month after this before getting the second booster shot. -Recommended pt f/u w PCP regarding leg swelling and potential need for water pill.  -Continue daily Vitamin B Complex. -Will see back in 4 months with labs.   FOLLOW UP: Labs and 1 unit of PRBC in 2 months Labs, MD visit and 1 unit of PRBC in 4 months Referral for Evusheld   The total time spent in the appointment was 20 minutes and more than 50% was on counseling and direct patient cares.  All of the patient's questions were answered with apparent satisfaction. The patient knows to call the clinic with any problems, questions or concerns.   Sullivan Lone MD [place] AAHIVMS [facility] Hematology/Oncology Physician [facility]  (Office):       [phone] (Work cell):  [phone] (Fax):           [phone]   I, Reinaldo Raddle, am acting as scribe for Dr. Sullivan Lone, MD.   .I have reviewed the above documentation for accuracy and completeness, and I agree with the above. Brunetta Genera MD

## 2020-12-30 ENCOUNTER — Ambulatory Visit: Payer: PPO | Admitting: *Deleted

## 2020-12-30 ENCOUNTER — Inpatient Hospital Stay: Payer: PPO | Admitting: Hematology

## 2020-12-30 ENCOUNTER — Other Ambulatory Visit: Payer: Self-pay

## 2020-12-30 ENCOUNTER — Inpatient Hospital Stay: Payer: PPO

## 2020-12-30 ENCOUNTER — Inpatient Hospital Stay: Payer: PPO | Attending: Hematology

## 2020-12-30 VITALS — BP 130/66 | HR 80 | Temp 98.1°F | Resp 18 | Ht 67.0 in | Wt 255.5 lb

## 2020-12-30 DIAGNOSIS — I509 Heart failure, unspecified: Secondary | ICD-10-CM | POA: Diagnosis not present

## 2020-12-30 DIAGNOSIS — I350 Nonrheumatic aortic (valve) stenosis: Secondary | ICD-10-CM | POA: Diagnosis not present

## 2020-12-30 DIAGNOSIS — D471 Chronic myeloproliferative disease: Secondary | ICD-10-CM

## 2020-12-30 DIAGNOSIS — D649 Anemia, unspecified: Secondary | ICD-10-CM | POA: Diagnosis not present

## 2020-12-30 DIAGNOSIS — J811 Chronic pulmonary edema: Secondary | ICD-10-CM | POA: Insufficient documentation

## 2020-12-30 DIAGNOSIS — M7989 Other specified soft tissue disorders: Secondary | ICD-10-CM | POA: Insufficient documentation

## 2020-12-30 DIAGNOSIS — J90 Pleural effusion, not elsewhere classified: Secondary | ICD-10-CM | POA: Insufficient documentation

## 2020-12-30 DIAGNOSIS — I4892 Unspecified atrial flutter: Secondary | ICD-10-CM | POA: Insufficient documentation

## 2020-12-30 DIAGNOSIS — D72829 Elevated white blood cell count, unspecified: Secondary | ICD-10-CM | POA: Diagnosis not present

## 2020-12-30 DIAGNOSIS — Z7901 Long term (current) use of anticoagulants: Secondary | ICD-10-CM | POA: Insufficient documentation

## 2020-12-30 DIAGNOSIS — I10 Essential (primary) hypertension: Secondary | ICD-10-CM

## 2020-12-30 DIAGNOSIS — I480 Paroxysmal atrial fibrillation: Secondary | ICD-10-CM

## 2020-12-30 DIAGNOSIS — I517 Cardiomegaly: Secondary | ICD-10-CM | POA: Insufficient documentation

## 2020-12-30 DIAGNOSIS — J4540 Moderate persistent asthma, uncomplicated: Secondary | ICD-10-CM

## 2020-12-30 DIAGNOSIS — D72821 Monocytosis (symptomatic): Secondary | ICD-10-CM | POA: Diagnosis not present

## 2020-12-30 DIAGNOSIS — I5032 Chronic diastolic (congestive) heart failure: Secondary | ICD-10-CM

## 2020-12-30 LAB — CBC WITH DIFFERENTIAL (CANCER CENTER ONLY)
Abs Immature Granulocytes: 6.96 10*3/uL — ABNORMAL HIGH (ref 0.00–0.07)
Basophils Absolute: 0.1 10*3/uL (ref 0.0–0.1)
Basophils Relative: 0 %
Eosinophils Absolute: 0.4 10*3/uL (ref 0.0–0.5)
Eosinophils Relative: 0 %
HCT: 23.8 % — ABNORMAL LOW (ref 36.0–46.0)
Hemoglobin: 7.5 g/dL — ABNORMAL LOW (ref 12.0–15.0)
Immature Granulocytes: 7 %
Lymphocytes Relative: 3 %
Lymphs Abs: 2.6 10*3/uL (ref 0.7–4.0)
MCH: 30.4 pg (ref 26.0–34.0)
MCHC: 31.5 g/dL (ref 30.0–36.0)
MCV: 96.4 fL (ref 80.0–100.0)
Monocytes Absolute: 2.3 10*3/uL — ABNORMAL HIGH (ref 0.1–1.0)
Monocytes Relative: 2 %
Neutro Abs: 86.7 10*3/uL — ABNORMAL HIGH (ref 1.7–7.7)
Neutrophils Relative %: 88 %
Platelet Count: 120 10*3/uL — ABNORMAL LOW (ref 150–400)
RBC: 2.47 MIL/uL — ABNORMAL LOW (ref 3.87–5.11)
RDW: 17.2 % — ABNORMAL HIGH (ref 11.5–15.5)
WBC Count: 99 10*3/uL (ref 4.0–10.5)
nRBC: 0 % (ref 0.0–0.2)

## 2020-12-30 LAB — CMP (CANCER CENTER ONLY)
ALT: 9 U/L (ref 0–44)
AST: 23 U/L (ref 15–41)
Albumin: 3.7 g/dL (ref 3.5–5.0)
Alkaline Phosphatase: 410 U/L — ABNORMAL HIGH (ref 38–126)
Anion gap: 10 (ref 5–15)
BUN: 28 mg/dL — ABNORMAL HIGH (ref 8–23)
CO2: 26 mmol/L (ref 22–32)
Calcium: 9.3 mg/dL (ref 8.9–10.3)
Chloride: 101 mmol/L (ref 98–111)
Creatinine: 1.37 mg/dL — ABNORMAL HIGH (ref 0.44–1.00)
GFR, Estimated: 40 mL/min — ABNORMAL LOW (ref >60)
Glucose, Bld: 85 mg/dL (ref 70–99)
Potassium: 4.1 mmol/L (ref 3.5–5.1)
Sodium: 137 mmol/L (ref 135–145)
Total Bilirubin: 0.5 mg/dL (ref 0.3–1.2)
Total Protein: 7.8 g/dL (ref 6.5–8.1)

## 2020-12-30 LAB — PREPARE RBC (CROSSMATCH)

## 2020-12-30 MED ORDER — SODIUM CHLORIDE 0.9% IV SOLUTION
250.0000 mL | Freq: Once | INTRAVENOUS | Status: AC
  Administered 2020-12-30: 250 mL via INTRAVENOUS
  Filled 2020-12-30: qty 250

## 2020-12-30 MED ORDER — ACETAMINOPHEN 325 MG PO TABS
650.0000 mg | ORAL_TABLET | Freq: Once | ORAL | Status: AC
  Administered 2020-12-30: 650 mg via ORAL

## 2020-12-30 MED ORDER — ACETAMINOPHEN 325 MG PO TABS
ORAL_TABLET | ORAL | Status: AC
  Filled 2020-12-30: qty 2

## 2020-12-30 NOTE — Chronic Care Management (AMB) (Addendum)
Care Management    RN Visit Note  12/30/2020 Name: Erika [name] MRN: [id number] DOB: [birth year]  Subjective: Erika [name] is a 80 y.o. year old female who is a primary care patient of [name], MD. The care management team was consulted for assistance with disease management and care coordination needs.    Engaged with patient face to face for follow up visit in response to provider referral for case management and/or care coordination services.   Consent to Services:   Ms. Willard was given information about Care Management services today including:  Care Management services includes personalized support from designated clinical staff supervised by her physician, including individualized plan of care and coordination with other care providers 24/7 contact phone numbers for assistance for urgent and routine care needs. The patient may stop case management services at any time by phone call to the office staff.  Patient agreed to services and consent obtained.   Assessment: Review of patient past medical history, allergies, medications, health status, including review of consultants reports, laboratory and other test data, was performed as part of comprehensive evaluation and provision of chronic care management services.   SDOH (Social Determinants of Health) assessments and interventions performed:    Care Plan  Allergies  Allergen Reactions   Prednisone Other (See Comments)    REACTION: Psychosis, talking out of head, insomnia   Seroquel [Quetiapine Fumarate] Nausea Only and Other (See Comments)    Caused very dry mouth    Outpatient Encounter Medications as of 12/30/2020  Medication Sig Note   ADVAIR DISKUS 500-50 MCG/DOSE AEPB Inhale 1 puff into the lungs 2 (two) times daily.    albuterol (VENTOLIN HFA) 108 (90 Base) MCG/ACT inhaler INHALE 1-2 PUFFS INTO THE LUNGS EVERY 6 HOURS AS NEEDED FOR WHEEZING OR FOR SHORTNESS OF BREATH    allopurinol (ZYLOPRIM) 300 MG  tablet Take 0.5 tablets (150 mg total) by mouth 2 (two) times daily.    amLODipine (NORVASC) 10 MG tablet Take 0.5 tablets (5 mg total) by mouth daily.    apixaban (ELIQUIS) 5 MG TABS tablet Take 1 tablet (5 mg total) by mouth 2 (two) times daily.    aspirin EC 81 MG tablet Take 1 tablet (81 mg total) by mouth daily.    Ensure Max Protein (ENSURE MAX PROTEIN) LIQD Take 330 mLs (11 oz total) by mouth 2 (two) times daily. (Patient taking differently: Take 11 oz by mouth 2 (two) times daily. She drinks regular Ensure not Ensure Max and she prefers chocolate) 12/01/2019: Will contact [employer] rep for coupons and/or free samples   fluticasone (FLONASE) 50 MCG/ACT nasal spray SHAKE LIQUID AND INSTILL ONE (1) SPRAY IN EACH NOSTRIL ONCE DAILY    furosemide (LASIX) 40 MG tablet Take 1 tablet (40 mg total) by mouth daily.    metoprolol tartrate (LOPRESSOR) 50 MG tablet TAKE 1 TABLET BY MOUTH TWICE DAILY    NYAMYC powder APPLY TOPICALLY TO AFFECTED AREAS TWICE DAILY    omeprazole (PRILOSEC) 20 MG capsule TAKE 1 CAPSULE BY MOUTH DAILY    PEG-KCl-NaCl-NaSulf-Na Asc-C (PLENVU) 140 g SOLR Take 140 g by mouth as directed. Manufacturer's coupon Universal coupon code:BIN: P2366821; GROUP: UK02542706; PCN: [id number]; ID: [id number]; PAY NO MORE $50    potassium chloride SA (KLOR-CON) 20 MEQ tablet Take 0.5 tablets (10 mEq total) by mouth daily.    pravastatin (PRAVACHOL) 20 MG tablet Take 1 tablet (20 mg total) by mouth daily.    sodium chloride (OCEAN) 0.65 %  SOLN nasal spray Place 1 spray into both nostrils as needed for congestion.    No facility-administered encounter medications on file as of 12/30/2020.    Patient Active Problem List   Diagnosis Date Noted   Dry mouth 10/30/2019   Chronic heart failure with preserved ejection fraction (HFpEF) ([place]) 09/09/2019   Aortic stenosis 08/22/2019   General weakness    Tumor lysis syndrome    Hyperuricemia    Thrombocytopenia (HCC)    Myeloproliferative disorder ([facility])  07/10/2019   AKI (acute kidney injury) ([place]) 07/10/2019   AF (paroxysmal atrial fibrillation) ([place]) 06/02/2017   Leukocytosis 05/29/2017   Anemia 05/29/2017   Leg swelling 02/23/2016   Hand cramps 11/22/2015   GERD (gastroesophageal reflux disease) 02/09/2013   OA (osteoarthritis) of knee 01/29/2012   OA (osteoarthritis of spine) 10/18/2011   Healthcare maintenance 04/25/2011   Hypokalemia 02/26/2008   Moderate persistent asthma 02/10/2008   Hyperlipidemia 08/04/2007   H/O Herpes zoster 08/29/2006   Obesity, Class III, BMI 40-49.9 (morbid obesity) (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    Conditions to be addressed/monitored:  HF, HTN, AFib, Asthma, myeloproliferative neoplasm  Care Plan : CCM RN- Heart Failure (Adult)  Updates made by Barrington Ellison, RN since 12/30/2020 12:00 AM     Problem: Symptom Exacerbation (Heart Failure)   Priority: High  Onset Date: 10/13/2019     Long-Range Goal: Symptom Exacerbation Prevented or Minimized   Start Date: 10/13/2019  Recent Progress: On track  Priority: High  Note:   ARE PLAN ENTRY (see longitudinal plan of care for additional care plan information)   Current Barriers:  Knowledge deficit related to basic heart failure pathophysiology and self care management- successful outreach to patient to complete follow up assessment, patient states she is doing well, did not require a blood transfusion when labs were drawn on 10/28/20 at the cancer center, she denies dyspnea or overall energy levels different from her baseline, chronic lower extremity edema at baseline,  she says she does not elevate her legs when sitting as she does not have a recliner. She denies heart palpitations or heart racing, she continues to voice good understanding of her treatment plan for HF  and, HTN a fib, she says she continues to weigh herself daily and record her weights in the [facility] calendar this CCM RN mailed to her. She says she was told by the [facility] staff they will no longer be providing services in the home. She says she has been trying to make a clinic appointment to discuss stopping her Eliquis in preparation for her colonoscopy in June and so she can have a tooth pulled; she is asking for this CCM RN's assistance in securing the appointment.  She says she continues to drink chocolate Ensure and would appreciate any samples she can get. She says she is willing to try chocolate Glucerna if samples area available and they can be dropped off at the cancer center and her son [name] will pick them up.   Case Manager Clinical Goal(s):  Over the next 30 - 60 days, patient will weigh self daily and record- met Over the next 30 - 60 days, patient will verbalize understanding of Heart Failure Action Plan and when to call doctor- met Over the next 30 - 60 days, patient will take  all Heart Failure mediations as prescribed Over the next 30 - 60 days, patient will weigh daily and record (notifying MD of 3 lb weight gain over night or 5 lb in a week)-   Interventions:  Reviewed Heart Failure a fib and HTN  treatment plans  Reviewed medications with patient and discussed medication taking behavior Discussed plans with patient for ongoing care management follow up and provided patient with direct contact information for care management team Reviewed weight gain parameters indicating need for  provider notification  Reviewed role of diuretics in prevention of fluid overload and management of heart failure Provided positive reinforcement for patient's excellent disease self management skills Will message patient's clinic provider and front desk staff that the best time to reach her is between 10-11 am  Reviewed scheduled/upcoming provider appointments  with oncologist on 12/30/20 Reminded patient she will hold Eliquis per MD direction before she has colonoscopy in June Will plan to deliver chocolate nutritional supplement to cancer center for her May 27th appt 12/30/20- Met with patient briefly at the [facility] to deliver one case of Chocolate Ensure  Patient Self Care Activities:  Takes Heart Failure Medications as prescribed Weighs daily and record (notifying MD of 3 lb weight gain over night or 5 lb in a week) Verbalizes understanding of and follows CHF Action Plan Adheres to low sodium diet, heart healthy diet - follows rescue plan if symptoms flare-up - knows when to call the doctor - tracks symptoms and what helps feel better or worse - dresses right for the weather, hot or cold     Care Plan : CCM RN Asthma (Adult)  Updates made by Barrington Ellison, RN since 12/30/2020 12:00 AM     Problem: Symptom Exacerbation (Asthma)   Priority: High  Onset Date: 08/12/2020     Long-Range Goal: Symptom Exacerbation Prevented or Minimized   Start Date: 08/12/2020  Recent Progress: On track  Priority: High  Note:   CARE PLAN ENTRY (see longitudinal plan of care for additional care plan information)  Current Barriers:   Knowledge deficits related to basic asthma self care/management- patient denies respiratory issues at present- patient denies recent asthma exacerbations despite high pollen count    Case Manager Clinical Goal(s): Over the next 30 days, patient will not be hospitalized for asthma exacerbation    Interventions:   Review definition of asthma- airways are more sensitive, common sxs- cough,chest tightness, SOB, wheezing,low energy Review Sxs of asthma flare- coughing especially at night, out of breath more easily, wheezing,chest tightness, fast breathing at rest Review sxs of severe flare- severe difficulty breathing, too short of breath to speak a full sentence or walk across a room, lips or fingers turning  blue, feeling like you are going to pass out Assessed patient's knowledge of asthma triggers and educated patient on how to avoid triggers- I.e.- wear mask when dusting,sweeping or doing yard work, use pump instead of aerosol bottles, don't use bleach or ammonia, use exhaust fans when cooking, set car air conditioning to recirculate so less pollution gets in Assessed patient's knowledge of asthma medications and ability to distinguish between rescue medications and controller medications and provided appropriate education, advise to carry rescue meds at all times Controlling triggers-  get early treatment for colds or URIs, wear scarf around your face in cold weather Teach patient that asthma is not in good control if using rescue inhaler more than twice weekly or if wake up at night with sxs more than  2 times per month Treat GERD since can cause coughing, increase airway inflammation, and trigger flare ups Lose weight- excess weight puts stress on airways Reinforce the need for taking controlled medications as indicated Provided instruction about proper use of medications used for management of asthma including inhalers Discuss the importance of pneumonia and flu vaccine Reinforce what to do in an asthma attack Met with patient briefly at the [facility] to deliver one case of Chocolate Ensure  Patient Self Care Activities:  Patient verbalizes understanding of plan to mange asthma to prevent hospitalization Self administers medications as prescribed Attends all scheduled provider appointments Calls pharmacy for medication refills Calls provider office for new concerns or questions        Plan: The care management team will reach out to the patient again over the next 30 days.  Kelli Churn RN, CCM, [facility] RN Care Manager [phone]

## 2020-12-30 NOTE — Patient Instructions (Signed)

## 2021-01-02 LAB — BPAM RBC
Blood Product Expiration Date: 202206292359
ISSUE DATE / TIME: 202205271336
Unit Type and Rh: 5100

## 2021-01-02 LAB — TYPE AND SCREEN
ABO/RH(D): O POS
Antibody Screen: NEGATIVE
Unit division: 0

## 2021-01-03 ENCOUNTER — Telehealth: Payer: Self-pay | Admitting: Adult Health

## 2021-01-03 ENCOUNTER — Telehealth: Payer: Self-pay | Admitting: Hematology

## 2021-01-03 LAB — SAMPLE TO BLOOD BANK

## 2021-01-03 NOTE — Telephone Encounter (Signed)
I called patient to discuss Evusheld, a long acting monoclonal antibody injection administered to patients with decreased immune systems or intolerance/allergy to the COVID 19 vaccine as COVID19 prevention.    Unable to reach patient.  LMOM to return my call.  Naveen Clardy, NP  

## 2021-01-03 NOTE — Telephone Encounter (Signed)
Left message with follow-up appointments per 5/27 los. Gave option to call back to reschedule if needed.

## 2021-01-16 ENCOUNTER — Encounter: Payer: PPO | Admitting: Internal Medicine

## 2021-01-16 ENCOUNTER — Telehealth: Payer: Self-pay | Admitting: Gastroenterology

## 2021-01-16 NOTE — Telephone Encounter (Signed)
Hey Dr. Tarri Glenn,   Patient called in to cancel procedure for 6/14. States caregiver have COVID and she does not have any one else to bring her. Rescheduled for 01/27/21.

## 2021-01-17 ENCOUNTER — Encounter: Payer: PPO | Admitting: Gastroenterology

## 2021-01-23 ENCOUNTER — Ambulatory Visit (INDEPENDENT_AMBULATORY_CARE_PROVIDER_SITE_OTHER): Payer: PPO | Admitting: Internal Medicine

## 2021-01-23 ENCOUNTER — Encounter: Payer: Self-pay | Admitting: Internal Medicine

## 2021-01-23 ENCOUNTER — Ambulatory Visit: Payer: PPO | Admitting: *Deleted

## 2021-01-23 VITALS — BP 143/63 | HR 81 | Wt 271.6 lb

## 2021-01-23 DIAGNOSIS — M7989 Other specified soft tissue disorders: Secondary | ICD-10-CM | POA: Diagnosis not present

## 2021-01-23 DIAGNOSIS — I5032 Chronic diastolic (congestive) heart failure: Secondary | ICD-10-CM | POA: Diagnosis not present

## 2021-01-23 DIAGNOSIS — I10 Essential (primary) hypertension: Secondary | ICD-10-CM

## 2021-01-23 DIAGNOSIS — Z0000 Encounter for general adult medical examination without abnormal findings: Secondary | ICD-10-CM

## 2021-01-23 DIAGNOSIS — I480 Paroxysmal atrial fibrillation: Secondary | ICD-10-CM

## 2021-01-23 DIAGNOSIS — R531 Weakness: Secondary | ICD-10-CM | POA: Diagnosis not present

## 2021-01-23 DIAGNOSIS — Z13220 Encounter for screening for lipoid disorders: Secondary | ICD-10-CM

## 2021-01-23 DIAGNOSIS — D471 Chronic myeloproliferative disease: Secondary | ICD-10-CM

## 2021-01-23 MED ORDER — POTASSIUM CHLORIDE CRYS ER 20 MEQ PO TBCR: 10.0000 meq | EXTENDED_RELEASE_TABLET | Freq: Every day | ORAL | 1 refills | Status: DC

## 2021-01-23 MED ORDER — FUROSEMIDE 40 MG PO TABS: 40.0000 mg | ORAL_TABLET | Freq: Every day | ORAL | 1 refills | Status: DC

## 2021-01-23 NOTE — Patient Instructions (Signed)
Visit Information It was nice meeting with you today.   Goals Addressed               This Visit's Progress     Patient Stated     Patient Stated- I need a lift chair for home use (pt-stated)        Timeframe:  Short-Range Goal Priority:  High Start Date:        01/23/21                   Expected End Date:    ongoing                   Follow Up Date 03/04/21   - secure order for lift chair - community message to [employer] reps re: specific provider documentation required for DME       Other     Track and Manage My Symptoms-Asthma        Timeframe:  Long-Range Goal Priority:  High Start Date:        08/12/20                     Expected End Date:    ongoing                   Follow Up Date 03/04/21   - avoid symptom triggers outdoors - eliminate symptom triggers at home - follow asthma action plan - keep follow-up appointments - keep rescue medicines on hand    Why is this important?   Keeping track of asthma symptoms can tell you a lot about your asthma control.  Based on symptoms and peak flow results you can see how well you are doing.  Your asthma action plan has a green, yellow and red zone. Green means all is good; it is your goal. Yellow means your symptoms are a little worse. You will need to adjust your medications. Being in the red zone means that your   asthma is out of control. You will need to use your rescue medicines. You may need emergency care.     Notes:  meeting goal       Track and Manage Symptoms-Heart Failure        Timeframe:  Long-Range Goal Priority:  High Start Date:         08/12/20                    Expected End Date:   ongoing                    Follow Up Date 02/22/21   - follow rescue plan if symptoms flare-up - know when to call the doctor - track symptoms and what helps feel better or worse - dress right for the weather, hot or cold    Why is this important?   You will be able to handle your symptoms better if you keep track of  them.  Making some simple changes to your lifestyle will help.  Eating healthy is one thing you can do to take good care of yourself.    Notes: Meeting goal         The patient verbalized understanding of instructions, educational materials, and care plan provided today and declined offer to receive copy of patient instructions, educational materials, and care plan.   The care management team will reach out to the patient again over the next 30 days.  Kelli Churn RN, CCM, [facility] RN Care Manager [phone]

## 2021-01-23 NOTE — Chronic Care Management (AMB) (Signed)
Care Management    RN Visit Note  01/23/2021 Name: Erika [name] MRN: [id number] DOB: 26-Jul-1941  Subjective: Erika [name] is a 80 y.o. year old female who is a primary care patient of [name], MD. The care management team was consulted for assistance with disease management and care coordination needs.    Engaged with patient face to face for follow up visit in response to provider referral for case management and/or care coordination services.   Consent to Services:   Erika [name] was given information about Care Management services today including:  Care Management services includes personalized support from designated clinical staff supervised by her physician, including individualized plan of care and coordination with other care providers 24/7 contact phone numbers for assistance for urgent and routine care needs. The patient may stop case management services at any time by phone call to the office staff.  Patient agreed to services and consent obtained.   Assessment: Review of patient past medical history, allergies, medications, health status, including review of consultants reports, laboratory and other test data, was performed as part of comprehensive evaluation and provision of chronic care management services.   SDOH (Social Determinants of Health) assessments and interventions performed:    Care Plan  Allergies  Allergen Reactions   Prednisone Other (See Comments)    REACTION: Psychosis, talking out of head, insomnia   Seroquel [Quetiapine Fumarate] Nausea Only and Other (See Comments)    Caused very dry mouth    Outpatient Encounter Medications as of 01/23/2021  Medication Sig Note   ADVAIR DISKUS 500-50 MCG/DOSE AEPB Inhale 1 puff into the lungs 2 (two) times daily.    albuterol (VENTOLIN HFA) 108 (90 Base) MCG/ACT inhaler INHALE 1-2 PUFFS INTO THE LUNGS EVERY 6 HOURS AS NEEDED FOR WHEEZING OR FOR SHORTNESS OF BREATH    allopurinol (ZYLOPRIM) 300 MG  tablet Take 0.5 tablets (150 mg total) by mouth 2 (two) times daily.    amLODipine (NORVASC) 10 MG tablet Take 0.5 tablets (5 mg total) by mouth daily.    apixaban (ELIQUIS) 5 MG TABS tablet Take 1 tablet (5 mg total) by mouth 2 (two) times daily.    aspirin EC 81 MG tablet Take 1 tablet (81 mg total) by mouth daily.    Ensure Max Protein (ENSURE MAX PROTEIN) LIQD Take 330 mLs (11 oz total) by mouth 2 (two) times daily. (Patient taking differently: Take 11 oz by mouth 2 (two) times daily. She drinks regular Ensure not Ensure Max and she prefers chocolate) 12/01/2019: Will contact [employer] rep for coupons and/or free samples   fluticasone (FLONASE) 50 MCG/ACT nasal spray SHAKE LIQUID AND INSTILL ONE (1) SPRAY IN EACH NOSTRIL ONCE DAILY    furosemide (LASIX) 40 MG tablet Take 1 tablet (40 mg total) by mouth daily.    metoprolol tartrate (LOPRESSOR) 50 MG tablet TAKE 1 TABLET BY MOUTH TWICE DAILY    NYAMYC powder APPLY TOPICALLY TO AFFECTED AREAS TWICE DAILY    omeprazole (PRILOSEC) 20 MG capsule TAKE 1 CAPSULE BY MOUTH DAILY    PEG-KCl-NaCl-NaSulf-Na Asc-C (PLENVU) 140 g SOLR Take 140 g by mouth as directed. Manufacturer's coupon Universal coupon code:BIN: P2366821; GROUP: [id number]; PCN: [id number]; ID: [id number]; PAY NO MORE $50    potassium chloride SA (KLOR-CON) 20 MEQ tablet Take 0.5 tablets (10 mEq total) by mouth daily.    pravastatin (PRAVACHOL) 20 MG tablet Take 1 tablet (20 mg total) by mouth daily.    sodium chloride (OCEAN) 0.65 %  SOLN nasal spray Place 1 spray into both nostrils as needed for congestion.    [DISCONTINUED] furosemide (LASIX) 40 MG tablet Take 1 tablet (40 mg total) by mouth daily.    [DISCONTINUED] potassium chloride SA (KLOR-CON) 20 MEQ tablet Take 0.5 tablets (10 mEq total) by mouth daily.    No facility-administered encounter medications on file as of 01/23/2021.    Patient Active Problem List   Diagnosis Date Noted   Dry mouth 10/30/2019   Chronic heart failure with  preserved ejection fraction (HFpEF) ([place]) 09/09/2019   Aortic stenosis 08/22/2019   General weakness    Tumor lysis syndrome    Hyperuricemia    Thrombocytopenia (HCC)    Myeloproliferative disorder ([place]) 07/10/2019   AKI (acute kidney injury) ([place]) 07/10/2019   AF (paroxysmal atrial fibrillation) ([place]) 06/02/2017   Leukocytosis 05/29/2017   Anemia 05/29/2017   Leg swelling 02/23/2016   Hand cramps 11/22/2015   GERD (gastroesophageal reflux disease) 02/09/2013   OA (osteoarthritis) of knee 01/29/2012   OA (osteoarthritis of spine) 10/18/2011   Healthcare maintenance 04/25/2011   Hypokalemia 02/26/2008   Moderate persistent asthma 02/10/2008   Hyperlipidemia 08/04/2007   H/O Herpes zoster 08/29/2006   Obesity, Class III, BMI 40-49.9 (morbid obesity) ([place]) 08/29/2006   History of breast cyst 08/29/2006   Essential hypertension 08/27/2006   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    Conditions to be addressed/monitored: HF, HTN, HLD, AFib, Asthma, myeloproliferative neoplasm  Care Plan : CCM RN- Heart Failure (Adult)  Updates made by Barrington Ellison, RN since 01/23/2021 12:00 AM     Problem: Symptom Exacerbation (Heart Failure)   Priority: High  Onset Date: 10/13/2019     Long-Range Goal: Symptom Exacerbation Prevented or Minimized   Start Date: 10/13/2019  Recent Progress: On track  Priority: High  Note:   ARE PLAN ENTRY (see longitudinal plan of care for additional care plan information)   Current Barriers:  Knowledge deficit related to basic heart failure pathophysiology and self care management- met with patient , along with Johnney Killian RN CM,  during her clinic visit today to complete follow up assessment, patient states she is doing well,  required a blood transfusion when labs were drawn on 12/30/20 at the cancer center, she denies dyspnea or overall energy levels different from  her baseline, chronic lower extremity edema at baseline,  she says she does not elevate her legs when sitting as she does not have a recliner, she is requesting assistance with securing a lift chair. She denies heart palpitations or heart racing, she continues to voice good understanding of her treatment plan for HF, HTN, and a fib, she says she has not been weighing daily because she needs new batteries for her home scales. Say her colonoscopy was rescheduled for June 24 th and she was told today to stop her Eliquis tomorrow and hold until the day after the procedure.  She says she continues to drink chocolate Ensure and does not need and samples at this time but would appreciate any samples she can get in the future. She says she is willing to try chocolate Glucerna if samples are available and they can be dropped off at the cancer center and her son [name] will pick them up.   Case Manager Clinical Goal(s):  Over the next 30 - 60 days, patient will weigh self daily and record- met Over the next 30 - 60 days,  patient will verbalize understanding of Heart Failure Action Plan and when to call doctor- met Over the next 30 - 60 days, patient will take all Heart Failure mediations as prescribed Over the next 30 - 60 days, patient will weigh daily and record (notifying MD of 3 lb weight gain over night or 5 lb in a week)-   Interventions:  Reviewed Heart Failure a fib and HTN  treatment plans  Reviewed medications with patient and discussed medication taking behavior Discussed plans with patient for ongoing care management follow up and provided patient with direct contact information for care management team Reviewed weight gain parameters indicating need for  provider notification  Reviewed role of diuretics in prevention of fluid overload and management of heart failure Provided patient with pack of for AAA batteries for her home scale and reviewed importance of daily weights to monitor fluid status.   Provided positive reinforcement for patient's excellent disease self management skills Reviewed instructions today related to holding  Eliquis starting tomorrow until she completes colonoscopy on 6/24.  Provided patient with [place] Management spiral bound calendar with demonstration of health information contained in calendar related to HTN, A fib and HF. Encouraged patient to record daily weights and home BP readings in the log sheet in the calendar.  Patient Self Care Activities:  Takes Heart Failure Medications as prescribed Weighs daily and record (notifying MD of 3 lb weight gain over night or 5 lb in a week) in [place] Management spiral bound calendar Science writer understanding of and follows CHF Action Plan Adheres to low sodium diet, heart healthy diet - follows rescue plan if symptoms flare-up - knows when to call the doctor - tracks symptoms and what helps feel better or worse - dresses right for the weather, hot or cold     Care Plan : CCM RN Asthma (Adult)  Updates made by Barrington Ellison, RN since 01/23/2021 12:00 AM     Problem: Symptom Exacerbation (Asthma)   Priority: High  Onset Date: 08/12/2020     Long-Range Goal: Symptom Exacerbation Prevented or Minimized   Start Date: 08/12/2020  Recent Progress: On track  Priority: High  Note:   CARE PLAN ENTRY (see longitudinal plan of care for additional care plan information)  Current Barriers:   Knowledge deficits related to basic asthma self care/management- met with patient along with Johnney Killian during her clinic visit to complete follow up assessment, patient states she used her rescue inhaler this morning but denies acute respiratory issues at present- patient denies recent asthma exacerbations despite high pollen count, states she is not aware the oncology department left her a message on 5/31/2 to arrange Evusheld, a long acting monoclonal antibody injection administered  to patients with decreased immune systems or intolerance/allergy to the COVID 19 vaccine as COVID19 prevention.      Case Manager Clinical Goal(s): Over the next 30 days, patient will not be hospitalized for asthma exacerbation    Interventions:   Review definition of asthma- airways are more sensitive, common sxs- cough,chest tightness, SOB, wheezing,low energy Review Sxs of asthma flare- coughing especially at night, out of breath more easily, wheezing,chest tightness, fast breathing at rest Review sxs of severe flare- severe difficulty breathing, too short of breath to speak a full sentence or walk across a room, lips or fingers turning blue, feeling like you are going to pass out Assessed patient's knowledge of asthma triggers and educated patient on how to avoid triggers- I.e.-  wear mask when dusting,sweeping or doing yard work, use pump instead of aerosol bottles, don't use bleach or ammonia, use exhaust fans when cooking, set car air conditioning to recirculate so less pollution gets in Assessed patient's knowledge of asthma medications and ability to distinguish between rescue medications and controller medications and provided appropriate education, advise to carry rescue meds at all times Controlling triggers-  get early treatment for colds or URIs, wear scarf around your face in cold weather Teach patient that asthma is not in good control if using rescue inhaler more than twice weekly or if wake up at night with sxs more than 2 times per month Treat GERD since can cause coughing, increase airway inflammation, and trigger flare ups Lose weight- excess weight puts stress on airways Reinforce the need for taking controlled medications as indicated Provided instruction about proper use of medications used for management of asthma including inhalers Discuss the importance of pneumonia and flu vaccine Reinforce what to do in an asthma attack Advised patient this CCM RN will contact NP  Wilber Bihari in the oncology department and ask her to reach out to patient again and arrange Euvusheld for 7/27 when she is at the cancer center for routine follow up  Patient Self Care Activities:  Patient verbalizes understanding of plan to mange asthma to prevent hospitalization Self administers medications as prescribed Attends all scheduled provider appointments Calls pharmacy for medication refills Calls provider office for new concerns or questions       Care Plan : CCM RN- DME needs- patient requesting lift chair  Updates made by Barrington Ellison, RN since 01/23/2021 12:00 AM     Problem: CHL AMB "PATIENT-SPECIFIC PROBLEM"- requesting lift chair for home use   Priority: High  Onset Date: 01/23/2021  Note:   Current Barriers:  Care Coordination needs related to needed DME/Supplies in a patient with HTN, HLD, ASthma, HF, myeloproliferative neoplasm Care Coordination needs related to DME in a patient with TN, HLD, ASthma, HF, myeloproliferative neoplasm  Nurse Case Manager Clinical Goal(s):  Over the next 30-90 days, patient will verbalize understanding of plan to obtain needed DME.  Over the next 30-90 days, patient will work with care guide and/or [employer]  to address needs related to DME needs. patient will work with [employer] to address needs related to [facility] of lift chair  Interventions:  Inter-disciplinary care team collaboration (see longitudinal plan of care) Collaboration with/confirmation of receipt of DME orders at [employer] with [employer] reps regarding securing lift chair  requesting specifics regarding documentation by provider collaboration with PCP regarding development and update of comprehensive plan of care as evidenced by provider attestation and co-signature Inter-disciplinary care team collaboration (see longitudinal plan of care) Patient Self Care Activities:  Patient will self administer medications as prescribed Patient will attend all scheduled  provider appointments Patient will call pharmacy for medication refills Patient will continue to perform ADL's independently Patient will continue to perform IADL's independently Patient will call provider office for new concerns or questions Unable to independently secure lift chair for home use  Initial goal documentation     Goal: Patient-Specific Goal- lift chair will be secured for patient   Start Date: 01/23/2021  This Visit's Progress: On track  Priority: High      Plan: The care management team will reach out to the patient again over the next 30 days.  Kelli Churn RN, CCM, [facility] RN Care Manager [phone]

## 2021-01-23 NOTE — Progress Notes (Signed)
   CC: medication refills  HPI:  [name] is a 80 y.o. with a past medical history listed below presenting for medication refills. For details of today's visit and the status of his chronic medical issues please refer to the assessment and plan.   Past Medical History:  Diagnosis Date   Anemia    with menses   Asthma    Atrial flutter, paroxysmal ([place])    Back pain    status post surgery 2002   Breast cyst    Excesion with FNA, begnin in 2004.    Degenerative joint disease of spine    Imaging 2005,  Degenerative hypertrophic facet arthritis changes L4-5 and L5-S1.[name]    History of shingles    Recurrent with post herpetic neuralgia.    Hypertension    Lymphadenopathy    Of the mediastinum, Right side CXR 2008, not read on 2010 cxr.    Menopause    Obesity    BMI 54   Psychosis ([place])    Secondary to prednisone.   Shingles    Stasis dermatitis    W/ LE edema, prviously on lasix now on mazxide.    SVT (supraventricular tachycardia) [facility]) June 2009   one run while hospitalized   Tuberculosis    active TB treated in 2002, hx of paraspinal lumbar TB,    Review of Systems:  Review of Systems  Constitutional:  Negative for chills, fever and malaise/fatigue.  Respiratory:  Negative for cough and shortness of breath.   Cardiovascular:  Positive for leg swelling. Negative for chest pain.  Gastrointestinal:  Negative for abdominal pain, blood in stool, melena, nausea and vomiting.  Neurological:  Negative for dizziness.    Physical Exam:  Vitals:   01/23/21 1015  BP: (!) 143/63  Pulse: 81  SpO2: 98%  Weight: 271 lb 9.6 oz (123.2 kg)    Physical Exam General: alert, appears stated age, in no acute distress HEENT: Normocephalic, atraumatic, EOM intact, conjunctiva normal CV: Regular rate and rhythm, no murmurs rubs or gallops Pulm: Clear to auscultation bilaterally, normal work of breathing Abdomen: Soft, nondistended, bowel sounds present, no tenderness to  palpation MSK: Bilateral lower extremity 1+ pitting edema Skin: Warm and dry Neuro: Alert and oriented x3   Assessment & Plan:   See Encounters Tab for problem based charting.  Patient discussed with Dr. Daryll Drown

## 2021-01-23 NOTE — Assessment & Plan Note (Signed)
Patient is requesting a home lift chair due to chronic generalized weakness of bilateral lower extremities and at times pain and leg swelling.  She states that she has difficulty getting up from laying down to sitting position.  She has chronic heart failure with preserved ejection fraction at times notable lower extremity swelling and pain.  This is intermittent in nature.  She has bilateral knee osteoarthritis as well as osteoarthritis of her spine which is also contributory.  Will place a DME order for a lift chair/lift mechanism.

## 2021-01-23 NOTE — Patient Instructions (Signed)
I have sent in refills for your medications.  I will have them filled with your pill pack starting in August.  If you have any other questions or concerns please give our clinic a call.

## 2021-01-23 NOTE — Assessment & Plan Note (Signed)
Patient is planned for colonoscopy on 6/24.  They advised her to stop taking Eliquis 2 days prior to her procedure.  Last lipid panel was over 5 years ago, will check today.

## 2021-01-24 LAB — LIPID PANEL
Chol/HDL Ratio: 1.9 ratio (ref 0.0–4.4)
Cholesterol, Total: 93 mg/dL — ABNORMAL LOW (ref 100–199)
HDL: 49 mg/dL (ref >39)
LDL Chol Calc (NIH): 29 mg/dL (ref 0–99)
Triglycerides: 72 mg/dL (ref 0–149)
VLDL Cholesterol Cal: 15 mg/dL (ref 5–40)

## 2021-01-25 ENCOUNTER — Telehealth: Payer: Self-pay | Admitting: Gastroenterology

## 2021-01-25 NOTE — Telephone Encounter (Signed)
Inbound call from pt stating her prep isn't covered under insurance and needed an alternative that's less expensive. Please advise. Thanks

## 2021-01-25 NOTE — Telephone Encounter (Signed)
Called pt and advised a sample of Plenvu has been placed at the front desk for her to pick up at her earliest convenience. Verbalized acceptance and understanding.

## 2021-01-26 ENCOUNTER — Other Ambulatory Visit: Payer: Self-pay | Admitting: Internal Medicine

## 2021-01-26 ENCOUNTER — Other Ambulatory Visit: Payer: Self-pay

## 2021-01-26 DIAGNOSIS — I5032 Chronic diastolic (congestive) heart failure: Secondary | ICD-10-CM

## 2021-01-26 DIAGNOSIS — K219 Gastro-esophageal reflux disease without esophagitis: Secondary | ICD-10-CM

## 2021-01-26 MED ORDER — FUROSEMIDE 40 MG PO TABS: 40.0000 mg | ORAL_TABLET | Freq: Every day | ORAL | 3 refills | Status: DC

## 2021-01-26 MED ORDER — POTASSIUM CHLORIDE CRYS ER 20 MEQ PO TBCR: 10.0000 meq | EXTENDED_RELEASE_TABLET | Freq: Every day | ORAL | 3 refills | Status: AC

## 2021-01-26 MED ORDER — FUROSEMIDE 40 MG PO TABS: 40.0000 mg | ORAL_TABLET | Freq: Every day | ORAL | 2 refills | Status: AC

## 2021-01-27 ENCOUNTER — Other Ambulatory Visit: Payer: Self-pay

## 2021-01-27 ENCOUNTER — Encounter: Payer: Self-pay | Admitting: Gastroenterology

## 2021-01-27 ENCOUNTER — Ambulatory Visit (AMBULATORY_SURGERY_CENTER): Payer: PPO | Admitting: Gastroenterology

## 2021-01-27 VITALS — BP 110/56 | HR 77 | Temp 98.0°F | Resp 18 | Ht 67.0 in | Wt 271.0 lb

## 2021-01-27 DIAGNOSIS — Z86010 Personal history of colonic polyps: Secondary | ICD-10-CM | POA: Diagnosis not present

## 2021-01-27 DIAGNOSIS — D124 Benign neoplasm of descending colon: Secondary | ICD-10-CM | POA: Diagnosis not present

## 2021-01-27 DIAGNOSIS — D120 Benign neoplasm of cecum: Secondary | ICD-10-CM | POA: Diagnosis not present

## 2021-01-27 DIAGNOSIS — Z1211 Encounter for screening for malignant neoplasm of colon: Secondary | ICD-10-CM

## 2021-01-27 MED ORDER — SODIUM CHLORIDE 0.9 % IV SOLN: 500.0000 mL | Freq: Once | INTRAVENOUS | Status: DC

## 2021-01-27 NOTE — Progress Notes (Signed)
CRNA K. Sherlean Foot notified of pt. Mildly audible Wheezing and subsequent use of inhalers in admitting.

## 2021-01-27 NOTE — Progress Notes (Signed)
pt tolerated well. VSS. awake and to recovery. Report given to RN.  

## 2021-01-27 NOTE — Progress Notes (Signed)
Mild audible wheezing.  Pt. Used Dulera and Albuterol inhalers in admitting.

## 2021-01-27 NOTE — Progress Notes (Signed)
Called to room to assist during endoscopic procedure.  Patient ID and intended procedure confirmed with present staff. Received instructions for my participation in the procedure from the performing physician.  

## 2021-01-27 NOTE — Patient Instructions (Signed)
Handout on polyps given to you today  Restart Eliquis tomorrow, 01/28/21. Do not take today.   YOU HAD AN ENDOSCOPIC PROCEDURE TODAY AT [facility]:   Refer to the procedure report that was given to you for any specific questions about what was found during the examination.  If the procedure report does not answer your questions, please call your gastroenterologist to clarify.  If you requested that your care partner not be given the details of your procedure findings, then the procedure report has been included in a sealed envelope for you to review at your convenience later.  YOU SHOULD EXPECT: Some feelings of bloating in the abdomen. Passage of more gas than usual.  Walking can help get rid of the air that was put into your GI tract during the procedure and reduce the bloating. If you had a lower endoscopy (such as a colonoscopy or flexible sigmoidoscopy) you may notice spotting of blood in your stool or on the toilet paper. If you underwent a bowel prep for your procedure, you may not have a normal bowel movement for a few days.  Please Note:  You might notice some irritation and congestion in your nose or some drainage.  This is from the oxygen used during your procedure.  There is no need for concern and it should clear up in a day or so.  SYMPTOMS TO REPORT IMMEDIATELY:  Following lower endoscopy (colonoscopy or flexible sigmoidoscopy):  Excessive amounts of blood in the stool  Significant tenderness or worsening of abdominal pains  Swelling of the abdomen that is new, acute  Fever of 100F or higher  For urgent or emergent issues, a gastroenterologist can be reached at any hour by calling [phone]. Do not use MyChart messaging for urgent concerns.    DIET:  We do recommend a small meal at first, but then you may proceed to your regular diet.  Drink plenty of fluids but you should avoid alcoholic beverages for 24 hours.  ACTIVITY:  You should plan to take it easy  for the rest of today and you should NOT DRIVE or use heavy machinery until tomorrow (because of the sedation medicines used during the test).    FOLLOW UP: Our staff will call the number listed on your records 48-72 hours following your procedure to check on you and address any questions or concerns that you may have regarding the information given to you following your procedure. If we do not reach you, we will leave a message.  We will attempt to reach you two times.  During this call, we will ask if you have developed any symptoms of COVID 19. If you develop any symptoms (ie: fever, flu-like symptoms, shortness of breath, cough etc.) before then, please call [phone].  If you test positive for Covid 19 in the 2 weeks post procedure, please call and report this information to [place].    If any biopsies were taken you will be contacted by phone or by letter within the next 1-3 weeks.  Please call us at [phone] if you have not heard about the biopsies in 3 weeks.    SIGNATURES/CONFIDENTIALITY: You and/or your care partner have signed paperwork which will be entered into your electronic medical record.  These signatures attest to the fact that that the information above on your After Visit Summary has been reviewed and is understood.  Full responsibility of the confidentiality of this discharge information lies with you and/or your care-partner.

## 2021-01-27 NOTE — Op Note (Signed)
[facility] Patient Name: Erika [name] Procedure Date: 01/27/2021 8:57 AM MRN: [id number] Endoscopist: Thornton Park MD, MD Age: 80 Referring MD:  Date of Birth: [birth year] Gender: Female Account #: [id number] Procedure:                Colonoscopy Indications:              Surveillance: Personal history of adenomatous                            polyps on last colonoscopy > 5 years ago                           Adenomatous polyp on colonoscopy in 2004 Medicines:                Monitored Anesthesia Care Procedure:                Pre-Anesthesia Assessment:                           - Prior to the procedure, a History and Physical                            was performed, and patient medications and                            allergies were reviewed. The patient's tolerance of                            previous anesthesia was also reviewed. The risks                            and benefits of the procedure and the sedation                            options and risks were discussed with the patient.                            All questions were answered, and informed consent                            was obtained. Prior Anticoagulants: The patient has                            taken Eliquis (apixaban), last dose was 2 days                            prior to procedure. ASA Grade Assessment: III - A                            patient with severe systemic disease. After                            reviewing the risks and benefits, the patient was  deemed in satisfactory condition to undergo the                            procedure.                           After obtaining informed consent, the colonoscope                            was passed under direct vision. Throughout the                            procedure, the patient's blood pressure, pulse, and                            oxygen saturations were monitored continuously. The                             CF [id number] #[id number] was introduced through the anus                            and advanced to the 3 cm into the ileum. The                            colonoscopy was performed without difficulty. The                            patient tolerated the procedure well. The quality                            of the bowel preparation was good. The terminal                            ileum, ileocecal valve, appendiceal orifice, and                            rectum were photographed. Scope In: 9:12:44 AM Scope Out: 9:31:40 AM Scope Withdrawal Time: 0 hours 16 minutes 6 seconds  Total Procedure Duration: 0 hours 18 minutes 56 seconds  Findings:                 The perianal and digital rectal examinations were                            normal.                           A 4 mm polyp was found in the descending colon. The                            polyp was flat. The polyp was removed with a cold                            snare. Resection and retrieval were complete.  Estimated blood loss was minimal.                           Two sessile polyps were found in the cecum. The                            polyps were 1 to 2 mm in size. These polyps were                            removed with a cold snare. Resection and retrieval                            were complete. Estimated blood loss was minimal.                           The exam was otherwise without abnormality on                            direct and retroflexion views. Complications:            No immediate complications. Estimated blood loss:                            Minimal. Estimated Blood Loss:     Estimated blood loss was minimal. Impression:               - One 4 mm polyp in the descending colon, removed                            with a cold snare. Resected and retrieved.                           - Two 1 to 2 mm polyps in the cecum, removed with a                            cold snare.  Resected and retrieved.                           - The examination was otherwise normal on direct                            and retroflexion views. Recommendation:           - Patient has a contact number available for                            emergencies. The signs and symptoms of potential                            delayed complications were discussed with the                            patient. Return to normal activities tomorrow.  Written discharge instructions were provided to the                            patient.                           - Resume previous diet.                           - Continue present medications.                           - Resume Eliquis tomorrow.                           - Await pathology results.                           - Repeat colonoscopy is not recommended due to                            current age for surveillance.                           - Emerging evidence supports eating a diet of                            fruits, vegetables, grains, calcium, and yogurt                            while reducing red meat and alcohol may reduce the                            risk of colon cancer.                           - Thank you for allowing me to be involved in your                            colon cancer prevention. Thornton Park MD, MD 01/27/2021 9:36:24 AM This report has been signed electronically.

## 2021-01-30 ENCOUNTER — Telehealth: Payer: PPO

## 2021-01-31 ENCOUNTER — Telehealth: Payer: Self-pay | Admitting: *Deleted

## 2021-01-31 NOTE — Telephone Encounter (Signed)
Unable to leave message, voice mailbox not set up

## 2021-01-31 NOTE — Telephone Encounter (Signed)
  Follow up Call-  Call back number 01/27/2021  Post procedure Call Back phone  # [phone]  Permission to leave phone message Yes  Some recent data might be hidden     Patient questions:  Do you have a fever, pain , or abdominal swelling? No. Pain Score  0 *  Have you tolerated food without any problems? Yes.    Have you been able to return to your normal activities? Yes.    Do you have any questions about your discharge instructions: Diet   No. Medications  No. Follow up visit  No.  Do you have questions or concerns about your Care? No.- pt reports seeing a small amount of blood with her first 2 bowel movements since her procedure. States it is only a small amounts. She denies pain or abdominal swelling. Pt did have polyps removed with colonoscopy and is on Eliquis. Pt states "I think it has cleared up now but I have not had a bowel movement yet today." Pt instructed to call back if blood is continued to be seen or if amount is larger. Pt agreeable to plan of care.   Actions: * If pain score is 4 or above: No action needed, pain <4.  Have you developed a fever since your procedure? no  2.   Have you had an respiratory symptoms (SOB or cough) since your procedure? no  3.   Have you tested positive for COVID 19 since your procedure no  4.   Have you had any family members/close contacts diagnosed with the COVID 19 since your procedure?  no   If yes to any of these questions please route to Joylene John, RN and Joella Prince, RN

## 2021-02-08 ENCOUNTER — Encounter: Payer: Self-pay | Admitting: Gastroenterology

## 2021-02-21 ENCOUNTER — Ambulatory Visit: Payer: PPO

## 2021-02-21 NOTE — Chronic Care Management (AMB) (Signed)
Care Management    RN Visit Note  02/21/2021 Name: Erika [name] MRN: [id number] DOB: 04/03/1941  Subjective: Erika [name] is a 80 y.o. year old female who is a primary care patient of Erika Falcon, MD. The care management team was consulted for assistance with disease management and care coordination needs.    Engaged with patient by telephone for follow up visit in response to provider referral for case management and/or care coordination services.   Consent to Services:   Erika [name] was given information about Care Management services today including:  Care Management services includes personalized support from designated clinical staff supervised by her physician, including individualized plan of care and coordination with other care providers 24/7 contact phone numbers for assistance for urgent and routine care needs. The patient may stop case management services at any time by phone call to the office staff.  Patient agreed to services and consent obtained.   Assessment: Review of patient past medical history, allergies, medications, health status, including review of consultants reports, laboratory and other test data, was performed as part of comprehensive evaluation and provision of chronic care management services.   SDOH (Social Determinants of Health) assessments and interventions performed:    Care Plan  Allergies  Allergen Reactions   Prednisone Other (See Comments)    REACTION: Psychosis, talking out of head, insomnia   Seroquel [Quetiapine Fumarate] Nausea Only and Other (See Comments)    Caused very dry mouth    Outpatient Encounter Medications as of 02/21/2021  Medication Sig Note   ADVAIR DISKUS 500-50 MCG/DOSE AEPB Inhale 1 puff into the lungs 2 (two) times daily.    albuterol (VENTOLIN HFA) 108 (90 Base) MCG/ACT inhaler INHALE 1-2 PUFFS INTO THE LUNGS EVERY 6 HOURS AS NEEDED FOR WHEEZING OR FOR SHORTNESS OF BREATH    allopurinol (ZYLOPRIM) 300 MG  tablet Take 0.5 tablets (150 mg total) by mouth 2 (two) times daily.    amLODipine (NORVASC) 10 MG tablet Take 0.5 tablets (5 mg total) by mouth daily.    apixaban (ELIQUIS) 5 MG TABS tablet Take 1 tablet (5 mg total) by mouth 2 (two) times daily. 01/27/2021: Last dose 01/24/21   aspirin EC 81 MG tablet Take 1 tablet (81 mg total) by mouth daily.    Ensure Max Protein (ENSURE MAX PROTEIN) LIQD Take 330 mLs (11 oz total) by mouth 2 (two) times daily. (Patient taking differently: Take 11 oz by mouth 2 (two) times daily. She drinks regular Ensure not Ensure Max and she prefers chocolate) 12/01/2019: Will contact [employer] rep for coupons and/or free samples   fluticasone (FLONASE) 50 MCG/ACT nasal spray SHAKE LIQUID AND INSTILL ONE (1) SPRAY IN EACH NOSTRIL ONCE DAILY    furosemide (LASIX) 20 MG tablet Take 20 mg by mouth daily.    furosemide (LASIX) 40 MG tablet Take 1 tablet (40 mg total) by mouth daily. (Patient not taking: Reported on 01/27/2021)    metoprolol tartrate (LOPRESSOR) 50 MG tablet TAKE 1 TABLET BY MOUTH TWICE DAILY    mometasone-formoterol (DULERA) 100-5 MCG/ACT AERO Inhale 2 puffs into the lungs 2 (two) times daily.    NYAMYC powder APPLY TOPICALLY TO AFFECTED AREAS TWICE DAILY    omeprazole (PRILOSEC) 20 MG capsule TAKE 1 CAPSULE BY MOUTH DAILY    potassium chloride SA (KLOR-CON) 20 MEQ tablet Take 0.5 tablets (10 mEq total) by mouth daily.    pravastatin (PRAVACHOL) 20 MG tablet Take 1 tablet (20 mg total) by mouth daily.  sodium chloride (OCEAN) 0.65 % SOLN nasal spray Place 1 spray into both nostrils as needed for congestion.    No facility-administered encounter medications on file as of 02/21/2021.    Patient Active Problem List   Diagnosis Date Noted   Dry mouth 10/30/2019   Chronic heart failure with preserved ejection fraction (HFpEF) ([place]) 09/09/2019   Aortic stenosis 08/22/2019   General weakness    Tumor lysis syndrome    Hyperuricemia    Thrombocytopenia (HCC)     Myeloproliferative disorder ([place]) 07/10/2019   AKI (acute kidney injury) ([place]) 07/10/2019   AF (paroxysmal atrial fibrillation) ([place]) 06/02/2017   Leukocytosis 05/29/2017   Anemia 05/29/2017   Leg swelling 02/23/2016   Hand cramps 11/22/2015   GERD (gastroesophageal reflux disease) 02/09/2013   OA (osteoarthritis) of knee 01/29/2012   OA (osteoarthritis of spine) 10/18/2011   Healthcare maintenance 04/25/2011   Hypokalemia 02/26/2008   Moderate persistent asthma 02/10/2008   Hyperlipidemia 08/04/2007   H/O Herpes zoster 08/29/2006   Obesity, Class III, BMI 40-49.9 (morbid obesity) ([place]) 08/29/2006   History of breast cyst 08/29/2006   Essential hypertension 08/27/2006   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    Conditions to be addressed/monitored: Atrial Fibrillation, CHF, and COPD  Care Plan : CCM RN- Heart Failure (Adult)  Updates made by Johnney Killian, RN since 02/21/2021 12:00 AM     Problem: Symptom Exacerbation (Heart Failure)   Priority: High  Onset Date: 10/13/2019     Long-Range Goal: Symptom Exacerbation Prevented or Minimized   Start Date: 10/13/2019  Recent Progress: On track  Priority: High  Note:   CARE PLAN ENTRY (see longitudinal plan of care for additional care plan information)   Current Barriers:  Knowledge deficit related to basic heart failure pathophysiology and self care management- Successful outreach to patient this afternoon.  She states she is doing well.  When this RNCM asked patient about her weighing herself, she stated she has gained some weight but could not remember her weight today.  She says she has some swelling in her legs but it is not severe and she felt she was having any shortness of breath.  Educated patient on importance of weighing herself daily and she states she understands.  Patient noted she has an appointment with Oncology next week,  03/01/21 at 10am and she would follow up on her HF with her oncologist.    Case Manager Clinical Goal(s):  Over the next 30 - 60 days, patient will weigh self daily and record-  Over the next 30 - 60 days, patient will verbalize understanding of Heart Failure Action Plan and when to call doctor Over the next 30 - 60 days, patient will take all Heart Failure mediations as prescribed Over the next 30 - 60 days, patient will weigh daily and record (notifying MD of 3 lb weight gain over night or 5 lb in a week)-   Interventions:  Reviewed Heart Failure a fib and HTN  treatment plans  Reviewed medications with patient and discussed medication taking behavior Discussed plans with patient for ongoing care management follow up and provided patient with direct contact information for care management team Reviewed weight gain parameters indicating need for  provider notification  Reviewed role of diuretics in prevention of fluid overload and management of heart failure   Patient Self Care Activities:  Takes Heart Failure Medications as prescribed Weighs daily and record (notifying MD  of 3 lb weight gain over night or 5 lb in a week) in [place] Management spiral bound calendar [employer] understanding of and follows CHF Action Plan Adheres to low sodium diet, heart healthy diet - follows rescue plan if symptoms flare-up - knows when to call the doctor - tracks symptoms and what helps feel better or worse - dresses right for the weather, hot or cold      Plan: Telephone follow up appointment with care management team member scheduled for:  30 days  Johnney Killian, RN, BSN, CCM Care Management Coordinator [facility] Phone: [phone] / Fax: [phone]

## 2021-02-21 NOTE — Patient Instructions (Signed)
Visit Information   Goals Addressed             This Visit's Progress    Track and Manage My Symptoms-Asthma       Timeframe:  Long-Range Goal Priority:  High Start Date:        08/12/20                     Expected End Date:    ongoing                   Follow Up Date 03/04/21   - avoid symptom triggers outdoors - eliminate symptom triggers at home - follow asthma action plan - keep follow-up appointments - keep rescue medicines on hand    Why is this important?   Keeping track of asthma symptoms can tell you a lot about your asthma control.  Based on symptoms and peak flow results you can see how well you are doing.  Your asthma action plan has a green, yellow and red zone. Green means all is good; it is your goal. Yellow means your symptoms are a little worse. You will need to adjust your medications. Being in the red zone means that your   asthma is out of control. You will need to use your rescue medicines. You may need emergency care.     Notes:  02/21/21- Discussed extreme heat triggers and she stays in the air condition as much as possible.     Track and Manage Symptoms-Heart Failure       Timeframe:  Long-Range Goal Priority:  High Start Date:         08/12/20                    Expected End Date:   ongoing                    Follow Up Date 04/25/21   - follow rescue plan if symptoms flare-up - know when to call the doctor - track symptoms and what helps feel better or worse - dress right for the weather, hot or cold    Why is this important?   You will be able to handle your symptoms better if you keep track of them.  Making some simple changes to your lifestyle will help.  Eating healthy is one thing you can do to take good care of yourself.    Notes:         The patient verbalized understanding of instructions, educational materials, and care plan provided today and declined offer to receive copy of patient instructions, educational materials, and care  plan.   Telephone follow up appointment with care management team member scheduled for: 03/29/21@2PM   Johnney Killian, RN, BSN, CCM Care Management Coordinator [facility] Phone: [phone] / Fax: [phone]

## 2021-02-27 ENCOUNTER — Telehealth: Payer: Self-pay | Admitting: Hematology

## 2021-02-27 NOTE — Telephone Encounter (Signed)
Scheduled appointment per 07/25 staff message. Patient is aware. 

## 2021-03-01 ENCOUNTER — Telehealth: Payer: Self-pay | Admitting: Hematology

## 2021-03-01 ENCOUNTER — Other Ambulatory Visit: Payer: PPO

## 2021-03-01 NOTE — Progress Notes (Signed)
Contacted pt to check on coming to today's appointment. Pt unable to get a ride. Informed pt I would have scheduling call her to reschedule. Pt verbalized understanding

## 2021-03-01 NOTE — Telephone Encounter (Signed)
Scheduled appt per 7/27 sch msg. Called pt, no answer. Left msg with appts dates and times.

## 2021-03-02 ENCOUNTER — Other Ambulatory Visit: Payer: PPO

## 2021-03-02 DIAGNOSIS — I10 Essential (primary) hypertension: Secondary | ICD-10-CM | POA: Diagnosis not present

## 2021-03-02 DIAGNOSIS — D696 Thrombocytopenia, unspecified: Secondary | ICD-10-CM | POA: Diagnosis not present

## 2021-03-02 DIAGNOSIS — K219 Gastro-esophageal reflux disease without esophagitis: Secondary | ICD-10-CM | POA: Diagnosis not present

## 2021-03-02 DIAGNOSIS — N179 Acute kidney failure, unspecified: Secondary | ICD-10-CM | POA: Diagnosis not present

## 2021-03-02 DIAGNOSIS — D471 Chronic myeloproliferative disease: Secondary | ICD-10-CM | POA: Diagnosis not present

## 2021-03-02 DIAGNOSIS — E883 Tumor lysis syndrome: Secondary | ICD-10-CM | POA: Diagnosis not present

## 2021-03-02 DIAGNOSIS — E790 Hyperuricemia without signs of inflammatory arthritis and tophaceous disease: Secondary | ICD-10-CM | POA: Diagnosis not present

## 2021-03-02 DIAGNOSIS — R300 Dysuria: Secondary | ICD-10-CM | POA: Diagnosis not present

## 2021-03-02 DIAGNOSIS — E785 Hyperlipidemia, unspecified: Secondary | ICD-10-CM | POA: Diagnosis not present

## 2021-03-02 DIAGNOSIS — I480 Paroxysmal atrial fibrillation: Secondary | ICD-10-CM | POA: Diagnosis not present

## 2021-03-03 ENCOUNTER — Other Ambulatory Visit: Payer: Self-pay

## 2021-03-03 ENCOUNTER — Inpatient Hospital Stay: Payer: PPO

## 2021-03-03 ENCOUNTER — Inpatient Hospital Stay: Payer: PPO | Attending: Hematology

## 2021-03-03 DIAGNOSIS — D471 Chronic myeloproliferative disease: Secondary | ICD-10-CM

## 2021-03-03 DIAGNOSIS — C946 Myelodysplastic disease, not classified: Secondary | ICD-10-CM | POA: Diagnosis not present

## 2021-03-03 LAB — CMP (CANCER CENTER ONLY)
ALT: 12 U/L (ref 0–44)
AST: 18 U/L (ref 15–41)
Albumin: 3.5 g/dL (ref 3.5–5.0)
Alkaline Phosphatase: 386 U/L — ABNORMAL HIGH (ref 38–126)
Anion gap: 9 (ref 5–15)
BUN: 62 mg/dL — ABNORMAL HIGH (ref 8–23)
CO2: 23 mmol/L (ref 22–32)
Calcium: 9.1 mg/dL (ref 8.9–10.3)
Chloride: 108 mmol/L (ref 98–111)
Creatinine: 2.68 mg/dL — ABNORMAL HIGH (ref 0.44–1.00)
GFR, Estimated: 18 mL/min — ABNORMAL LOW (ref >60)
Glucose, Bld: 87 mg/dL (ref 70–99)
Potassium: 5.2 mmol/L — ABNORMAL HIGH (ref 3.5–5.1)
Sodium: 140 mmol/L (ref 135–145)
Total Bilirubin: 0.7 mg/dL (ref 0.3–1.2)
Total Protein: 7 g/dL (ref 6.5–8.1)

## 2021-03-03 LAB — SAMPLE TO BLOOD BANK

## 2021-03-03 LAB — CBC WITH DIFFERENTIAL (CANCER CENTER ONLY)
Abs Immature Granulocytes: 7.55 10*3/uL — ABNORMAL HIGH (ref 0.00–0.07)
Basophils Absolute: 0.1 10*3/uL (ref 0.0–0.1)
Basophils Relative: 0 %
Eosinophils Absolute: 0.4 10*3/uL (ref 0.0–0.5)
Eosinophils Relative: 0 %
HCT: 23 % — ABNORMAL LOW (ref 36.0–46.0)
Hemoglobin: 7.1 g/dL — ABNORMAL LOW (ref 12.0–15.0)
Immature Granulocytes: 6 %
Lymphocytes Relative: 2 %
Lymphs Abs: 2.3 10*3/uL (ref 0.7–4.0)
MCH: 29.7 pg (ref 26.0–34.0)
MCHC: 30.9 g/dL (ref 30.0–36.0)
MCV: 96.2 fL (ref 80.0–100.0)
Monocytes Absolute: 2.9 10*3/uL — ABNORMAL HIGH (ref 0.1–1.0)
Monocytes Relative: 2 %
Neutro Abs: 120.6 10*3/uL — ABNORMAL HIGH (ref 1.7–7.7)
Neutrophils Relative %: 90 %
Platelet Count: 128 10*3/uL — ABNORMAL LOW (ref 150–400)
RBC: 2.39 MIL/uL — ABNORMAL LOW (ref 3.87–5.11)
RDW: 19.2 % — ABNORMAL HIGH (ref 11.5–15.5)
WBC Count: 133.7 10*3/uL (ref 4.0–10.5)
nRBC: 0 % (ref 0.0–0.2)

## 2021-03-03 LAB — PREPARE RBC (CROSSMATCH)

## 2021-03-03 NOTE — Progress Notes (Signed)
Communicated critical labs with readback to C. Long, CMA at 10:30.  WBC 133.7 and Hgb 7.1

## 2021-03-04 ENCOUNTER — Other Ambulatory Visit: Payer: Self-pay

## 2021-03-04 ENCOUNTER — Inpatient Hospital Stay: Payer: PPO

## 2021-03-04 DIAGNOSIS — C946 Myelodysplastic disease, not classified: Secondary | ICD-10-CM | POA: Diagnosis not present

## 2021-03-04 DIAGNOSIS — D471 Chronic myeloproliferative disease: Secondary | ICD-10-CM

## 2021-03-04 MED ORDER — HEPARIN SOD (PORK) LOCK FLUSH 100 UNIT/ML IV SOLN
500.0000 [IU] | Freq: Every day | INTRAVENOUS | Status: DC | PRN
  Filled 2021-03-04: qty 5

## 2021-03-04 MED ORDER — ACETAMINOPHEN 325 MG PO TABS
650.0000 mg | ORAL_TABLET | Freq: Once | ORAL | Status: AC
  Administered 2021-03-04: 650 mg via ORAL

## 2021-03-04 MED ORDER — SODIUM CHLORIDE 0.9% IV SOLUTION
250.0000 mL | Freq: Once | INTRAVENOUS | Status: AC
  Administered 2021-03-04: 250 mL via INTRAVENOUS
  Filled 2021-03-04: qty 250

## 2021-03-04 MED ORDER — SODIUM CHLORIDE 0.9% FLUSH
10.0000 mL | INTRAVENOUS | Status: DC | PRN
  Filled 2021-03-04: qty 10

## 2021-03-04 NOTE — Patient Instructions (Signed)
Blood Transfusion, Adult, Care After This sheet gives you information about how to care for yourself after your procedure. Your doctor may also give you more specific instructions. If youhave problems or questions, contact your doctor. What can I expect after the procedure? After the procedure, it is common to have: Bruising and soreness at the IV site. A fever or chills on the day of the procedure. This may be your body's response to the new blood cells received. A headache. Follow these instructions at home: Insertion site care     Follow instructions from your doctor about how to take care of your insertion site. This is where an IV tube was put into your vein. Make sure you: Wash your hands with soap and water before and after you change your bandage (dressing). If you cannot use soap and water, use hand sanitizer. Change your bandage as told by your doctor. Check your insertion site every day for signs of infection. Check for: Redness, swelling, or pain. Bleeding from the site. Warmth. Pus or a bad smell. General instructions Take over-the-counter and prescription medicines only as told by your doctor. Rest as told by your doctor. Go back to your normal activities as told by your doctor. Keep all follow-up visits as told by your doctor. This is important. Contact a doctor if: You have itching or red, swollen areas of skin (hives). You feel worried or nervous (anxious). You feel weak after doing your normal activities. You have redness, swelling, warmth, or pain around the insertion site. You have blood coming from the insertion site, and the blood does not stop with pressure. You have pus or a bad smell coming from the insertion site. Get help right away if: You have signs of a serious reaction. This may be coming from an allergy or the body's defense system (immune system). Signs include: Trouble breathing or shortness of breath. Swelling of the face or feeling warm  (flushed). Fever or chills. Head, chest, or back pain. Dark pee (urine) or blood in the pee. Widespread rash. Fast heartbeat. Feeling dizzy or light-headed. You may receive your blood transfusion in an outpatient setting. If so, youwill be told whom to contact to report any reactions. These symptoms may be an emergency. Do not wait to see if the symptoms will go away. Get medical help right away. Call your local emergency services (911 in the [place] Do not drive yourself to the hospital. Summary Bruising and soreness at the IV site are common. Check your insertion site every day for signs of infection. Rest as told by your doctor. Go back to your normal activities as told by your doctor. Get help right away if you have signs of a serious reaction. This information is not intended to replace advice given to you by your health care provider. Make sure you discuss any questions you have with your healthcare provider. Document Revised: 01/15/2019 Document Reviewed: 01/15/2019 Elsevier Patient Education  2022 Elsevier Inc.  

## 2021-03-06 LAB — TYPE AND SCREEN
ABO/RH(D): O POS
Antibody Screen: NEGATIVE
Unit division: 0

## 2021-03-06 LAB — BPAM RBC
Blood Product Expiration Date: 202209012359
ISSUE DATE / TIME: 202207301102
Unit Type and Rh: 5100

## 2021-03-08 DIAGNOSIS — R300 Dysuria: Secondary | ICD-10-CM | POA: Diagnosis not present

## 2021-03-08 DIAGNOSIS — I5032 Chronic diastolic (congestive) heart failure: Secondary | ICD-10-CM | POA: Diagnosis not present

## 2021-03-08 DIAGNOSIS — D471 Chronic myeloproliferative disease: Secondary | ICD-10-CM | POA: Diagnosis not present

## 2021-03-10 DIAGNOSIS — K219 Gastro-esophageal reflux disease without esophagitis: Secondary | ICD-10-CM | POA: Diagnosis not present

## 2021-03-10 DIAGNOSIS — D696 Thrombocytopenia, unspecified: Secondary | ICD-10-CM | POA: Diagnosis not present

## 2021-03-10 DIAGNOSIS — Z7951 Long term (current) use of inhaled steroids: Secondary | ICD-10-CM | POA: Diagnosis not present

## 2021-03-10 DIAGNOSIS — R300 Dysuria: Secondary | ICD-10-CM | POA: Diagnosis not present

## 2021-03-10 DIAGNOSIS — M1990 Unspecified osteoarthritis, unspecified site: Secondary | ICD-10-CM | POA: Diagnosis not present

## 2021-03-10 DIAGNOSIS — J4520 Mild intermittent asthma, uncomplicated: Secondary | ICD-10-CM | POA: Diagnosis not present

## 2021-03-10 DIAGNOSIS — I110 Hypertensive heart disease with heart failure: Secondary | ICD-10-CM | POA: Diagnosis not present

## 2021-03-10 DIAGNOSIS — Z7901 Long term (current) use of anticoagulants: Secondary | ICD-10-CM | POA: Diagnosis not present

## 2021-03-10 DIAGNOSIS — Z7982 Long term (current) use of aspirin: Secondary | ICD-10-CM | POA: Diagnosis not present

## 2021-03-10 DIAGNOSIS — E7800 Pure hypercholesterolemia, unspecified: Secondary | ICD-10-CM | POA: Diagnosis not present

## 2021-03-10 DIAGNOSIS — E883 Tumor lysis syndrome: Secondary | ICD-10-CM | POA: Diagnosis not present

## 2021-03-10 DIAGNOSIS — D471 Chronic myeloproliferative disease: Secondary | ICD-10-CM | POA: Diagnosis not present

## 2021-03-10 DIAGNOSIS — I509 Heart failure, unspecified: Secondary | ICD-10-CM | POA: Diagnosis not present

## 2021-03-10 DIAGNOSIS — E790 Hyperuricemia without signs of inflammatory arthritis and tophaceous disease: Secondary | ICD-10-CM | POA: Diagnosis not present

## 2021-03-10 DIAGNOSIS — I480 Paroxysmal atrial fibrillation: Secondary | ICD-10-CM | POA: Diagnosis not present

## 2021-03-14 DIAGNOSIS — J4520 Mild intermittent asthma, uncomplicated: Secondary | ICD-10-CM | POA: Diagnosis not present

## 2021-03-14 DIAGNOSIS — Z7982 Long term (current) use of aspirin: Secondary | ICD-10-CM | POA: Diagnosis not present

## 2021-03-14 DIAGNOSIS — I480 Paroxysmal atrial fibrillation: Secondary | ICD-10-CM | POA: Diagnosis not present

## 2021-03-14 DIAGNOSIS — R300 Dysuria: Secondary | ICD-10-CM | POA: Diagnosis not present

## 2021-03-14 DIAGNOSIS — Z7901 Long term (current) use of anticoagulants: Secondary | ICD-10-CM | POA: Diagnosis not present

## 2021-03-14 DIAGNOSIS — E883 Tumor lysis syndrome: Secondary | ICD-10-CM | POA: Diagnosis not present

## 2021-03-14 DIAGNOSIS — D696 Thrombocytopenia, unspecified: Secondary | ICD-10-CM | POA: Diagnosis not present

## 2021-03-14 DIAGNOSIS — K219 Gastro-esophageal reflux disease without esophagitis: Secondary | ICD-10-CM | POA: Diagnosis not present

## 2021-03-14 DIAGNOSIS — I110 Hypertensive heart disease with heart failure: Secondary | ICD-10-CM | POA: Diagnosis not present

## 2021-03-14 DIAGNOSIS — E7800 Pure hypercholesterolemia, unspecified: Secondary | ICD-10-CM | POA: Diagnosis not present

## 2021-03-14 DIAGNOSIS — I509 Heart failure, unspecified: Secondary | ICD-10-CM | POA: Diagnosis not present

## 2021-03-14 DIAGNOSIS — D471 Chronic myeloproliferative disease: Secondary | ICD-10-CM | POA: Diagnosis not present

## 2021-03-14 DIAGNOSIS — Z7951 Long term (current) use of inhaled steroids: Secondary | ICD-10-CM | POA: Diagnosis not present

## 2021-03-14 DIAGNOSIS — E790 Hyperuricemia without signs of inflammatory arthritis and tophaceous disease: Secondary | ICD-10-CM | POA: Diagnosis not present

## 2021-03-14 DIAGNOSIS — M1990 Unspecified osteoarthritis, unspecified site: Secondary | ICD-10-CM | POA: Diagnosis not present

## 2021-03-15 ENCOUNTER — Inpatient Hospital Stay (HOSPITAL_COMMUNITY)
Admission: EM | Admit: 2021-03-15 | Discharge: 2021-04-06 | DRG: 871 | Disposition: E | Payer: PPO | Attending: Internal Medicine | Admitting: Internal Medicine

## 2021-03-15 ENCOUNTER — Other Ambulatory Visit: Payer: Self-pay

## 2021-03-15 DIAGNOSIS — E162 Hypoglycemia, unspecified: Secondary | ICD-10-CM | POA: Diagnosis present

## 2021-03-15 DIAGNOSIS — J690 Pneumonitis due to inhalation of food and vomit: Secondary | ICD-10-CM | POA: Diagnosis not present

## 2021-03-15 DIAGNOSIS — Z6841 Body Mass Index (BMI) 40.0 and over, adult: Secondary | ICD-10-CM

## 2021-03-15 DIAGNOSIS — G9341 Metabolic encephalopathy: Secondary | ICD-10-CM | POA: Diagnosis not present

## 2021-03-15 DIAGNOSIS — D849 Immunodeficiency, unspecified: Secondary | ICD-10-CM | POA: Diagnosis present

## 2021-03-15 DIAGNOSIS — D735 Infarction of spleen: Secondary | ICD-10-CM | POA: Diagnosis not present

## 2021-03-15 DIAGNOSIS — K921 Melena: Secondary | ICD-10-CM | POA: Diagnosis not present

## 2021-03-15 DIAGNOSIS — J9602 Acute respiratory failure with hypercapnia: Secondary | ICD-10-CM | POA: Diagnosis not present

## 2021-03-15 DIAGNOSIS — K2970 Gastritis, unspecified, without bleeding: Secondary | ICD-10-CM

## 2021-03-15 DIAGNOSIS — I130 Hypertensive heart and chronic kidney disease with heart failure and stage 1 through stage 4 chronic kidney disease, or unspecified chronic kidney disease: Secondary | ICD-10-CM | POA: Diagnosis present

## 2021-03-15 DIAGNOSIS — K2971 Gastritis, unspecified, with bleeding: Secondary | ICD-10-CM | POA: Diagnosis present

## 2021-03-15 DIAGNOSIS — K922 Gastrointestinal hemorrhage, unspecified: Secondary | ICD-10-CM | POA: Diagnosis present

## 2021-03-15 DIAGNOSIS — H9220 Otorrhagia, unspecified ear: Secondary | ICD-10-CM | POA: Diagnosis not present

## 2021-03-15 DIAGNOSIS — K7200 Acute and subacute hepatic failure without coma: Secondary | ICD-10-CM | POA: Diagnosis present

## 2021-03-15 DIAGNOSIS — N179 Acute kidney failure, unspecified: Secondary | ICD-10-CM

## 2021-03-15 DIAGNOSIS — Z7901 Long term (current) use of anticoagulants: Secondary | ICD-10-CM | POA: Diagnosis not present

## 2021-03-15 DIAGNOSIS — D649 Anemia, unspecified: Secondary | ICD-10-CM

## 2021-03-15 DIAGNOSIS — R0989 Other specified symptoms and signs involving the circulatory and respiratory systems: Secondary | ICD-10-CM | POA: Diagnosis not present

## 2021-03-15 DIAGNOSIS — R161 Splenomegaly, not elsewhere classified: Secondary | ICD-10-CM | POA: Diagnosis not present

## 2021-03-15 DIAGNOSIS — K7460 Unspecified cirrhosis of liver: Secondary | ICD-10-CM | POA: Diagnosis not present

## 2021-03-15 DIAGNOSIS — J4540 Moderate persistent asthma, uncomplicated: Secondary | ICD-10-CM | POA: Diagnosis present

## 2021-03-15 DIAGNOSIS — I5033 Acute on chronic diastolic (congestive) heart failure: Secondary | ICD-10-CM | POA: Diagnosis present

## 2021-03-15 DIAGNOSIS — E874 Mixed disorder of acid-base balance: Secondary | ICD-10-CM | POA: Diagnosis present

## 2021-03-15 DIAGNOSIS — R6521 Severe sepsis with septic shock: Secondary | ICD-10-CM | POA: Diagnosis not present

## 2021-03-15 DIAGNOSIS — R571 Hypovolemic shock: Secondary | ICD-10-CM | POA: Diagnosis not present

## 2021-03-15 DIAGNOSIS — Z79899 Other long term (current) drug therapy: Secondary | ICD-10-CM

## 2021-03-15 DIAGNOSIS — N170 Acute kidney failure with tubular necrosis: Secondary | ICD-10-CM | POA: Diagnosis present

## 2021-03-15 DIAGNOSIS — K5939 Other megacolon: Secondary | ICD-10-CM | POA: Diagnosis not present

## 2021-03-15 DIAGNOSIS — J189 Pneumonia, unspecified organism: Secondary | ICD-10-CM | POA: Diagnosis present

## 2021-03-15 DIAGNOSIS — J9601 Acute respiratory failure with hypoxia: Secondary | ICD-10-CM | POA: Diagnosis not present

## 2021-03-15 DIAGNOSIS — R188 Other ascites: Secondary | ICD-10-CM | POA: Diagnosis not present

## 2021-03-15 DIAGNOSIS — K319 Disease of stomach and duodenum, unspecified: Secondary | ICD-10-CM | POA: Diagnosis not present

## 2021-03-15 DIAGNOSIS — D631 Anemia in chronic kidney disease: Secondary | ICD-10-CM | POA: Diagnosis present

## 2021-03-15 DIAGNOSIS — I517 Cardiomegaly: Secondary | ICD-10-CM | POA: Diagnosis not present

## 2021-03-15 DIAGNOSIS — Z978 Presence of other specified devices: Secondary | ICD-10-CM

## 2021-03-15 DIAGNOSIS — D469 Myelodysplastic syndrome, unspecified: Secondary | ICD-10-CM | POA: Diagnosis present

## 2021-03-15 DIAGNOSIS — I509 Heart failure, unspecified: Secondary | ICD-10-CM | POA: Diagnosis not present

## 2021-03-15 DIAGNOSIS — D696 Thrombocytopenia, unspecified: Secondary | ICD-10-CM | POA: Diagnosis present

## 2021-03-15 DIAGNOSIS — A4151 Sepsis due to Escherichia coli [E. coli]: Principal | ICD-10-CM | POA: Diagnosis present

## 2021-03-15 DIAGNOSIS — I480 Paroxysmal atrial fibrillation: Secondary | ICD-10-CM | POA: Diagnosis not present

## 2021-03-15 DIAGNOSIS — D259 Leiomyoma of uterus, unspecified: Secondary | ICD-10-CM | POA: Diagnosis not present

## 2021-03-15 DIAGNOSIS — I5032 Chronic diastolic (congestive) heart failure: Secondary | ICD-10-CM | POA: Diagnosis not present

## 2021-03-15 DIAGNOSIS — K439 Ventral hernia without obstruction or gangrene: Secondary | ICD-10-CM | POA: Diagnosis not present

## 2021-03-15 DIAGNOSIS — Z4682 Encounter for fitting and adjustment of non-vascular catheter: Secondary | ICD-10-CM | POA: Diagnosis not present

## 2021-03-15 DIAGNOSIS — Z20822 Contact with and (suspected) exposure to covid-19: Secondary | ICD-10-CM | POA: Diagnosis present

## 2021-03-15 DIAGNOSIS — K2991 Gastroduodenitis, unspecified, with bleeding: Secondary | ICD-10-CM | POA: Diagnosis present

## 2021-03-15 DIAGNOSIS — D471 Chronic myeloproliferative disease: Secondary | ICD-10-CM | POA: Diagnosis present

## 2021-03-15 DIAGNOSIS — K219 Gastro-esophageal reflux disease without esophagitis: Secondary | ICD-10-CM | POA: Diagnosis not present

## 2021-03-15 DIAGNOSIS — R109 Unspecified abdominal pain: Secondary | ICD-10-CM | POA: Diagnosis not present

## 2021-03-15 DIAGNOSIS — Z7189 Other specified counseling: Secondary | ICD-10-CM | POA: Diagnosis not present

## 2021-03-15 DIAGNOSIS — K2990 Gastroduodenitis, unspecified, without bleeding: Secondary | ICD-10-CM | POA: Diagnosis not present

## 2021-03-15 DIAGNOSIS — R579 Shock, unspecified: Secondary | ICD-10-CM | POA: Diagnosis not present

## 2021-03-15 DIAGNOSIS — I110 Hypertensive heart disease with heart failure: Secondary | ICD-10-CM | POA: Diagnosis not present

## 2021-03-15 DIAGNOSIS — Z66 Do not resuscitate: Secondary | ICD-10-CM | POA: Diagnosis not present

## 2021-03-15 DIAGNOSIS — N390 Urinary tract infection, site not specified: Secondary | ICD-10-CM | POA: Diagnosis present

## 2021-03-15 DIAGNOSIS — I4892 Unspecified atrial flutter: Secondary | ICD-10-CM | POA: Diagnosis present

## 2021-03-15 DIAGNOSIS — D72829 Elevated white blood cell count, unspecified: Secondary | ICD-10-CM | POA: Diagnosis not present

## 2021-03-15 DIAGNOSIS — C9310 Chronic myelomonocytic leukemia not having achieved remission: Secondary | ICD-10-CM | POA: Diagnosis not present

## 2021-03-15 DIAGNOSIS — D62 Acute posthemorrhagic anemia: Secondary | ICD-10-CM | POA: Diagnosis present

## 2021-03-15 DIAGNOSIS — R748 Abnormal levels of other serum enzymes: Secondary | ICD-10-CM

## 2021-03-15 DIAGNOSIS — R0602 Shortness of breath: Secondary | ICD-10-CM | POA: Diagnosis not present

## 2021-03-15 DIAGNOSIS — Z7951 Long term (current) use of inhaled steroids: Secondary | ICD-10-CM

## 2021-03-15 DIAGNOSIS — E875 Hyperkalemia: Secondary | ICD-10-CM | POA: Diagnosis present

## 2021-03-15 DIAGNOSIS — A419 Sepsis, unspecified organism: Secondary | ICD-10-CM | POA: Diagnosis not present

## 2021-03-15 DIAGNOSIS — N1832 Chronic kidney disease, stage 3b: Secondary | ICD-10-CM | POA: Diagnosis present

## 2021-03-15 DIAGNOSIS — J9811 Atelectasis: Secondary | ICD-10-CM | POA: Diagnosis not present

## 2021-03-15 DIAGNOSIS — T82528A Displacement of other cardiac and vascular devices and implants, initial encounter: Secondary | ICD-10-CM

## 2021-03-15 DIAGNOSIS — R531 Weakness: Secondary | ICD-10-CM | POA: Diagnosis not present

## 2021-03-15 DIAGNOSIS — Z515 Encounter for palliative care: Secondary | ICD-10-CM

## 2021-03-15 DIAGNOSIS — I350 Nonrheumatic aortic (valve) stenosis: Secondary | ICD-10-CM | POA: Diagnosis present

## 2021-03-15 DIAGNOSIS — K5900 Constipation, unspecified: Secondary | ICD-10-CM | POA: Diagnosis present

## 2021-03-15 HISTORY — DX: Chronic myeloproliferative disease: D47.1

## 2021-03-15 LAB — COMPREHENSIVE METABOLIC PANEL
ALT: 18 U/L (ref 0–44)
AST: 27 U/L (ref 15–41)
Albumin: 3.2 g/dL — ABNORMAL LOW (ref 3.5–5.0)
Alkaline Phosphatase: 354 U/L — ABNORMAL HIGH (ref 38–126)
Anion gap: 12 (ref 5–15)
BUN: 98 mg/dL — ABNORMAL HIGH (ref 8–23)
CO2: 18 mmol/L — ABNORMAL LOW (ref 22–32)
Calcium: 8.9 mg/dL (ref 8.9–10.3)
Chloride: 107 mmol/L (ref 98–111)
Creatinine, Ser: 3.35 mg/dL — ABNORMAL HIGH (ref 0.44–1.00)
GFR, Estimated: 14 mL/min — ABNORMAL LOW (ref >60)
Glucose, Bld: 61 mg/dL — ABNORMAL LOW (ref 70–99)
Potassium: 5.8 mmol/L — ABNORMAL HIGH (ref 3.5–5.1)
Sodium: 137 mmol/L (ref 135–145)
Total Bilirubin: 1.1 mg/dL (ref 0.3–1.2)
Total Protein: 6.5 g/dL (ref 6.5–8.1)

## 2021-03-15 LAB — PROTIME-INR
INR: 2.9 — ABNORMAL HIGH (ref 0.8–1.2)
Prothrombin Time: 30.3 seconds — ABNORMAL HIGH (ref 11.4–15.2)

## 2021-03-15 NOTE — ED Triage Notes (Signed)
Brought in by [facility] from home - black tarry stools x 1 week right after colonoscopy procedure. Pt on blood thinner Eliquis.   Pt found alone at home and was covered in her own feces.

## 2021-03-15 NOTE — ED Provider Notes (Signed)
Emergency Medicine Provider Triage Evaluation Note  Erika [name] , a 80 y.o. female  was evaluated in triage.  Pt complains of black tarry stool x 1 week. On Eliquis and denies use of iron supplementation. Has some SOB, but feels this is at her baseline. Does report lightheadedness. Denies abdominal pain, vomiting, nausea, hx of stomach ulcers, NSAID use. Had a colonoscopy 1 month ago. Found at home covered in her own feces.  Review of Systems  Positive: Melena  Negative: Abdominal pain, vomiting, fever, syncope  Physical Exam  BP (!) 100/51 (BP Location: Right Arm)   Pulse 68   Temp 97.8 F (36.6 C) (Oral)   Resp 20   SpO2 99%  Gen:   Awake, no distress   Resp:  Normal effort  MSK:   Moves extremities without difficulty  Other:  Morbidly obese. Limited to no independent mobility at baseline  Medical Decision Making  Medically screening exam initiated at 10:27 PM.  Appropriate orders placed.  Erika [name] was informed that the remainder of the evaluation will be completed by another provider, this initial triage assessment does not replace that evaluation, and the importance of remaining in the ED until their evaluation is complete.  [place]   Erika Breach, PA-C 03/11/2021 2230    Erika [name] A, DO 03/16/21 0028

## 2021-03-16 ENCOUNTER — Other Ambulatory Visit (HOSPITAL_COMMUNITY): Payer: PPO

## 2021-03-16 ENCOUNTER — Other Ambulatory Visit: Payer: Self-pay

## 2021-03-16 ENCOUNTER — Inpatient Hospital Stay (HOSPITAL_COMMUNITY): Payer: PPO

## 2021-03-16 ENCOUNTER — Emergency Department (HOSPITAL_COMMUNITY): Payer: PPO

## 2021-03-16 DIAGNOSIS — J9601 Acute respiratory failure with hypoxia: Secondary | ICD-10-CM | POA: Diagnosis not present

## 2021-03-16 DIAGNOSIS — N170 Acute kidney failure with tubular necrosis: Secondary | ICD-10-CM | POA: Diagnosis present

## 2021-03-16 DIAGNOSIS — D649 Anemia, unspecified: Secondary | ICD-10-CM | POA: Diagnosis not present

## 2021-03-16 DIAGNOSIS — E875 Hyperkalemia: Secondary | ICD-10-CM | POA: Diagnosis present

## 2021-03-16 DIAGNOSIS — I5033 Acute on chronic diastolic (congestive) heart failure: Secondary | ICD-10-CM | POA: Diagnosis present

## 2021-03-16 DIAGNOSIS — I480 Paroxysmal atrial fibrillation: Secondary | ICD-10-CM | POA: Diagnosis not present

## 2021-03-16 DIAGNOSIS — J4540 Moderate persistent asthma, uncomplicated: Secondary | ICD-10-CM | POA: Diagnosis present

## 2021-03-16 DIAGNOSIS — D72829 Elevated white blood cell count, unspecified: Secondary | ICD-10-CM | POA: Diagnosis not present

## 2021-03-16 DIAGNOSIS — D469 Myelodysplastic syndrome, unspecified: Secondary | ICD-10-CM | POA: Diagnosis not present

## 2021-03-16 DIAGNOSIS — J9602 Acute respiratory failure with hypercapnia: Secondary | ICD-10-CM | POA: Diagnosis not present

## 2021-03-16 DIAGNOSIS — K7200 Acute and subacute hepatic failure without coma: Secondary | ICD-10-CM | POA: Diagnosis present

## 2021-03-16 DIAGNOSIS — N179 Acute kidney failure, unspecified: Secondary | ICD-10-CM | POA: Diagnosis not present

## 2021-03-16 DIAGNOSIS — K2971 Gastritis, unspecified, with bleeding: Secondary | ICD-10-CM | POA: Diagnosis present

## 2021-03-16 DIAGNOSIS — Z66 Do not resuscitate: Secondary | ICD-10-CM | POA: Diagnosis not present

## 2021-03-16 DIAGNOSIS — Z515 Encounter for palliative care: Secondary | ICD-10-CM | POA: Diagnosis not present

## 2021-03-16 DIAGNOSIS — K2991 Gastroduodenitis, unspecified, with bleeding: Secondary | ICD-10-CM | POA: Diagnosis present

## 2021-03-16 DIAGNOSIS — A4151 Sepsis due to Escherichia coli [E. coli]: Secondary | ICD-10-CM | POA: Diagnosis present

## 2021-03-16 DIAGNOSIS — I130 Hypertensive heart and chronic kidney disease with heart failure and stage 1 through stage 4 chronic kidney disease, or unspecified chronic kidney disease: Secondary | ICD-10-CM | POA: Diagnosis present

## 2021-03-16 DIAGNOSIS — D849 Immunodeficiency, unspecified: Secondary | ICD-10-CM | POA: Diagnosis present

## 2021-03-16 DIAGNOSIS — Z7901 Long term (current) use of anticoagulants: Secondary | ICD-10-CM | POA: Diagnosis not present

## 2021-03-16 DIAGNOSIS — R6521 Severe sepsis with septic shock: Secondary | ICD-10-CM | POA: Diagnosis not present

## 2021-03-16 DIAGNOSIS — Z6841 Body Mass Index (BMI) 40.0 and over, adult: Secondary | ICD-10-CM | POA: Diagnosis not present

## 2021-03-16 DIAGNOSIS — J189 Pneumonia, unspecified organism: Secondary | ICD-10-CM | POA: Diagnosis present

## 2021-03-16 DIAGNOSIS — K922 Gastrointestinal hemorrhage, unspecified: Secondary | ICD-10-CM | POA: Diagnosis not present

## 2021-03-16 DIAGNOSIS — D631 Anemia in chronic kidney disease: Secondary | ICD-10-CM | POA: Diagnosis present

## 2021-03-16 DIAGNOSIS — R571 Hypovolemic shock: Secondary | ICD-10-CM | POA: Diagnosis not present

## 2021-03-16 DIAGNOSIS — C9310 Chronic myelomonocytic leukemia not having achieved remission: Secondary | ICD-10-CM | POA: Diagnosis not present

## 2021-03-16 DIAGNOSIS — D696 Thrombocytopenia, unspecified: Secondary | ICD-10-CM | POA: Diagnosis present

## 2021-03-16 DIAGNOSIS — J690 Pneumonitis due to inhalation of food and vomit: Secondary | ICD-10-CM | POA: Diagnosis not present

## 2021-03-16 DIAGNOSIS — Z20822 Contact with and (suspected) exposure to covid-19: Secondary | ICD-10-CM | POA: Diagnosis present

## 2021-03-16 DIAGNOSIS — K921 Melena: Secondary | ICD-10-CM | POA: Diagnosis not present

## 2021-03-16 DIAGNOSIS — R579 Shock, unspecified: Secondary | ICD-10-CM | POA: Diagnosis not present

## 2021-03-16 DIAGNOSIS — K2970 Gastritis, unspecified, without bleeding: Secondary | ICD-10-CM | POA: Diagnosis not present

## 2021-03-16 DIAGNOSIS — Z7189 Other specified counseling: Secondary | ICD-10-CM | POA: Diagnosis not present

## 2021-03-16 DIAGNOSIS — A419 Sepsis, unspecified organism: Secondary | ICD-10-CM | POA: Diagnosis not present

## 2021-03-16 DIAGNOSIS — G9341 Metabolic encephalopathy: Secondary | ICD-10-CM | POA: Diagnosis not present

## 2021-03-16 LAB — CBC WITH DIFFERENTIAL/PLATELET
Abs Immature Granulocytes: 11.92 10*3/uL — ABNORMAL HIGH (ref 0.00–0.07)
Abs Immature Granulocytes: 0 10*3/uL (ref 0.00–0.07)
Band Neutrophils: 10 %
Basophils Absolute: 0.1 10*3/uL (ref 0.0–0.1)
Basophils Absolute: 0 10*3/uL (ref 0.0–0.1)
Basophils Relative: 0 %
Basophils Relative: 0 %
Eosinophils Absolute: 0.3 10*3/uL (ref 0.0–0.5)
Eosinophils Absolute: 0 10*3/uL (ref 0.0–0.5)
Eosinophils Relative: 0 %
Eosinophils Relative: 0 %
HCT: 19.2 % — ABNORMAL LOW (ref 36.0–46.0)
HCT: 22.7 % — ABNORMAL LOW (ref 36.0–46.0)
Hemoglobin: 6.3 g/dL — CL (ref 12.0–15.0)
Hemoglobin: 7.3 g/dL — ABNORMAL LOW (ref 12.0–15.0)
Immature Granulocytes: 7 %
Lymphocytes Relative: 2 %
Lymphocytes Relative: 3 %
Lymphs Abs: 2.9 10*3/uL (ref 0.7–4.0)
Lymphs Abs: 4.7 10*3/uL — ABNORMAL HIGH (ref 0.7–4.0)
MCH: 31.5 pg (ref 26.0–34.0)
MCH: 30.3 pg (ref 26.0–34.0)
MCHC: 32.8 g/dL (ref 30.0–36.0)
MCHC: 32.2 g/dL (ref 30.0–36.0)
MCV: 96 fL (ref 80.0–100.0)
MCV: 94.2 fL (ref 80.0–100.0)
Monocytes Absolute: 3.8 10*3/uL — ABNORMAL HIGH (ref 0.1–1.0)
Monocytes Absolute: 1.6 10*3/uL — ABNORMAL HIGH (ref 0.1–1.0)
Monocytes Relative: 2 %
Monocytes Relative: 1 %
Neutro Abs: 153.8 10*3/uL — ABNORMAL HIGH (ref 1.7–7.7)
Neutro Abs: 151.7 10*3/uL — ABNORMAL HIGH (ref 1.7–7.7)
Neutrophils Relative %: 89 %
Neutrophils Relative %: 86 %
Platelets: 142 10*3/uL — ABNORMAL LOW (ref 150–400)
Platelets: 129 10*3/uL — ABNORMAL LOW (ref 150–400)
RBC: 2 MIL/uL — ABNORMAL LOW (ref 3.87–5.11)
RBC: 2.41 MIL/uL — ABNORMAL LOW (ref 3.87–5.11)
RDW: 20 % — ABNORMAL HIGH (ref 11.5–15.5)
RDW: 18.6 % — ABNORMAL HIGH (ref 11.5–15.5)
WBC: 172.8 10*3/uL (ref 4.0–10.5)
WBC: 158 10*3/uL (ref 4.0–10.5)
nRBC: 0 % (ref 0.0–0.2)
nRBC: 0 % (ref 0.0–0.2)

## 2021-03-16 LAB — LACTATE DEHYDROGENASE: LDH: 221 U/L — ABNORMAL HIGH (ref 98–192)

## 2021-03-16 LAB — COMPREHENSIVE METABOLIC PANEL
ALT: 18 U/L (ref 0–44)
AST: 25 U/L (ref 15–41)
Albumin: 3.2 g/dL — ABNORMAL LOW (ref 3.5–5.0)
Alkaline Phosphatase: 316 U/L — ABNORMAL HIGH (ref 38–126)
Anion gap: 13 (ref 5–15)
BUN: 100 mg/dL — ABNORMAL HIGH (ref 8–23)
CO2: 20 mmol/L — ABNORMAL LOW (ref 22–32)
Calcium: 8.8 mg/dL — ABNORMAL LOW (ref 8.9–10.3)
Chloride: 106 mmol/L (ref 98–111)
Creatinine, Ser: 3.32 mg/dL — ABNORMAL HIGH (ref 0.44–1.00)
GFR, Estimated: 14 mL/min — ABNORMAL LOW (ref >60)
Glucose, Bld: 42 mg/dL — CL (ref 70–99)
Potassium: 5 mmol/L (ref 3.5–5.1)
Sodium: 139 mmol/L (ref 135–145)
Total Bilirubin: 1.1 mg/dL (ref 0.3–1.2)
Total Protein: 6.4 g/dL — ABNORMAL LOW (ref 6.5–8.1)

## 2021-03-16 LAB — ECHOCARDIOGRAM COMPLETE
AR max vel: 1.14 cm2
AV Area VTI: 1.26 cm2
AV Area mean vel: 1.3 cm2
AV Mean grad: 16 mmHg
AV Peak grad: 32.3 mmHg
Ao pk vel: 2.84 m/s
Area-P 1/2: 2.22 cm2
Height: 67 in
P 1/2 time: 488 msec
S' Lateral: 2.7 cm
Weight: 4400 oz

## 2021-03-16 LAB — CBC
HCT: 22.9 % — ABNORMAL LOW (ref 36.0–46.0)
Hemoglobin: 7.2 g/dL — ABNORMAL LOW (ref 12.0–15.0)
MCH: 30 pg (ref 26.0–34.0)
MCHC: 31.4 g/dL (ref 30.0–36.0)
MCV: 95.4 fL (ref 80.0–100.0)
Platelets: 130 10*3/uL — ABNORMAL LOW (ref 150–400)
RBC: 2.4 MIL/uL — ABNORMAL LOW (ref 3.87–5.11)
RDW: 18.9 % — ABNORMAL HIGH (ref 11.5–15.5)
WBC: 162.4 10*3/uL (ref 4.0–10.5)
nRBC: 0 % (ref 0.0–0.2)

## 2021-03-16 LAB — URINALYSIS, ROUTINE W REFLEX MICROSCOPIC
Bilirubin Urine: NEGATIVE
Glucose, UA: 50 mg/dL — AB
Ketones, ur: NEGATIVE mg/dL
Nitrite: NEGATIVE
Protein, ur: 30 mg/dL — AB
Specific Gravity, Urine: 1.014 (ref 1.005–1.030)
WBC, UA: >50 WBC/hpf — ABNORMAL HIGH (ref 0–5)
pH: 5 (ref 5.0–8.0)

## 2021-03-16 LAB — CBG MONITORING, ED
Glucose-Capillary: 91 mg/dL (ref 70–99)
Glucose-Capillary: 84 mg/dL (ref 70–99)
Glucose-Capillary: 71 mg/dL (ref 70–99)
Glucose-Capillary: 76 mg/dL (ref 70–99)

## 2021-03-16 LAB — PHOSPHORUS: Phosphorus: 5.9 mg/dL — ABNORMAL HIGH (ref 2.5–4.6)

## 2021-03-16 LAB — BASIC METABOLIC PANEL
Anion gap: 14 (ref 5–15)
BUN: 104 mg/dL — ABNORMAL HIGH (ref 8–23)
CO2: 19 mmol/L — ABNORMAL LOW (ref 22–32)
Calcium: 8.8 mg/dL — ABNORMAL LOW (ref 8.9–10.3)
Chloride: 105 mmol/L (ref 98–111)
Creatinine, Ser: 3.34 mg/dL — ABNORMAL HIGH (ref 0.44–1.00)
GFR, Estimated: 14 mL/min — ABNORMAL LOW (ref >60)
Glucose, Bld: 35 mg/dL — CL (ref 70–99)
Potassium: 5.7 mmol/L — ABNORMAL HIGH (ref 3.5–5.1)
Sodium: 138 mmol/L (ref 135–145)

## 2021-03-16 LAB — SODIUM, URINE, RANDOM: Sodium, Ur: 16 mmol/L

## 2021-03-16 LAB — URIC ACID: Uric Acid, Serum: 9.9 mg/dL — ABNORMAL HIGH (ref 2.5–7.1)

## 2021-03-16 LAB — SARS CORONAVIRUS 2 (TAT 6-24 HRS): SARS Coronavirus 2: NEGATIVE

## 2021-03-16 LAB — PREPARE RBC (CROSSMATCH)

## 2021-03-16 LAB — CREATININE, URINE, RANDOM: Creatinine, Urine: 88.43 mg/dL

## 2021-03-16 LAB — POC OCCULT BLOOD, ED: Fecal Occult Bld: POSITIVE — AB

## 2021-03-16 MED ORDER — PANTOPRAZOLE 80MG IVPB - SIMPLE MED
80.0000 mg | Freq: Once | INTRAVENOUS | Status: AC
  Administered 2021-03-16: 80 mg via INTRAVENOUS
  Filled 2021-03-16: qty 80

## 2021-03-16 MED ORDER — SODIUM ZIRCONIUM CYCLOSILICATE 10 G PO PACK
10.0000 g | PACK | Freq: Once | ORAL | Status: AC
  Administered 2021-03-16: 10 g via ORAL
  Filled 2021-03-16: qty 1

## 2021-03-16 MED ORDER — SODIUM ZIRCONIUM CYCLOSILICATE 10 G PO PACK: 10.0000 g | PACK | Freq: Every day | ORAL | Status: DC

## 2021-03-16 MED ORDER — PANTOPRAZOLE INFUSION (NEW) - SIMPLE MED
8.0000 mg/h | INTRAVENOUS | Status: DC
  Administered 2021-03-16: 8 mg/h via INTRAVENOUS
  Filled 2021-03-16: qty 80

## 2021-03-16 MED ORDER — SODIUM CHLORIDE 0.9 % IV SOLN
10.0000 mL/h | Freq: Once | INTRAVENOUS | Status: AC
  Administered 2021-03-16: 10 mL/h via INTRAVENOUS

## 2021-03-16 MED ORDER — DEXTROSE 50 % IV SOLN
INTRAVENOUS | Status: AC
  Administered 2021-03-16: 50 mL
  Filled 2021-03-16: qty 50

## 2021-03-16 MED ORDER — PANTOPRAZOLE SODIUM 40 MG IV SOLR: 40.0000 mg | Freq: Two times a day (BID) | INTRAVENOUS | Status: DC

## 2021-03-16 MED ORDER — FUROSEMIDE 10 MG/ML IJ SOLN
80.0000 mg | Freq: Once | INTRAMUSCULAR | Status: AC
  Administered 2021-03-16: 80 mg via INTRAVENOUS
  Filled 2021-03-16: qty 8

## 2021-03-16 MED ORDER — ALLOPURINOL 300 MG PO TABS
150.0000 mg | ORAL_TABLET | Freq: Two times a day (BID) | ORAL | Status: DC
  Administered 2021-03-16 – 2021-03-17 (×3): 150 mg via ORAL
  Filled 2021-03-16 (×2): qty 1

## 2021-03-16 MED ORDER — PANTOPRAZOLE SODIUM 40 MG IV SOLR
40.0000 mg | Freq: Two times a day (BID) | INTRAVENOUS | Status: DC
  Administered 2021-03-16 – 2021-03-17 (×3): 40 mg via INTRAVENOUS
  Filled 2021-03-16 (×3): qty 40

## 2021-03-16 MED ORDER — FLUTICASONE FUROATE-VILANTEROL 200-25 MCG/INH IN AEPB
1.0000 | INHALATION_SPRAY | Freq: Every day | RESPIRATORY_TRACT | Status: DC
  Administered 2021-03-21 – 2021-03-22 (×2): 1 via RESPIRATORY_TRACT
  Filled 2021-03-16 (×2): qty 28

## 2021-03-16 MED ORDER — CALCIUM GLUCONATE-NACL 1-0.675 GM/50ML-% IV SOLN
1.0000 g | Freq: Once | INTRAVENOUS | Status: AC
  Administered 2021-03-16: 1000 mg via INTRAVENOUS
  Filled 2021-03-16: qty 50

## 2021-03-16 MED ORDER — ALBUTEROL SULFATE (2.5 MG/3ML) 0.083% IN NEBU
2.5000 mg | INHALATION_SOLUTION | Freq: Four times a day (QID) | RESPIRATORY_TRACT | Status: DC | PRN
  Administered 2021-03-16 – 2021-03-22 (×4): 2.5 mg via RESPIRATORY_TRACT
  Filled 2021-03-16 (×5): qty 3

## 2021-03-16 MED ORDER — SODIUM CHLORIDE 0.9 % IV SOLN
1.0000 g | INTRAVENOUS | Status: DC
  Administered 2021-03-16: 1 g via INTRAVENOUS
  Filled 2021-03-16: qty 10

## 2021-03-16 NOTE — ED Provider Notes (Addendum)
[facility] EMERGENCY DEPARTMENT Provider Note   CSN: [id number] Arrival date & time: 03/10/2021  2209     History Chief Complaint  Patient presents with   Rectal Bleeding    Erika [name] is a 80 y.o. female.  The history is provided by the patient.  Rectal Bleeding She has history of hypertension, heart failure with preserved ejection fraction, atrial flutter anticoagulated on apixaban, myelodysplastic syndrome and comes in with progressive weakness over the last 3 days.  She has had some orthostatic dizziness, but has not passed out.  She denies chest pain, heaviness, tightness, pressure.  She does admit to some heartburn.  She has noted dark stools for the last 3 days.  Last dose of apixaban was yesterday morning.  She has required blood transfusions in the past, but she denies history of GI bleeding.   Past Medical History:  Diagnosis Date   Anemia    with menses   Asthma    Atrial flutter, paroxysmal ([place])    Back pain    status post surgery 2002   Blood transfusion without reported diagnosis    Breast cyst    Excesion with FNA, begnin in 2004.    CHF (congestive heart failure) (HCC)    Degenerative joint disease of spine    Imaging 2005,  Degenerative hypertrophic facet arthritis changes L4-5 and L5-S1.[name]    History of shingles    Recurrent with post herpetic neuralgia.    Hypertension    Lymphadenopathy    Of the mediastinum, Right side CXR 2008, not read on 2010 cxr.    Menopause    Obesity    BMI 54   Psychosis ([place])    Secondary to prednisone.   Shingles    Stasis dermatitis    W/ LE edema, prviously on lasix now on mazxide.    SVT (supraventricular tachycardia) ([place]) 01/2008   one run while hospitalized   Tuberculosis    active TB treated in 2002, hx of paraspinal lumbar TB,     Patient Active Problem List   Diagnosis Date Noted   Dry mouth 10/30/2019   Chronic heart failure with preserved ejection fraction (HFpEF) ([place]) 09/09/2019    Aortic stenosis 08/22/2019   General weakness    Tumor lysis syndrome    Hyperuricemia    Thrombocytopenia (HCC)    Myeloproliferative disorder ([place]) 07/10/2019   AKI (acute kidney injury) ([place]) 07/10/2019   AF (paroxysmal atrial fibrillation) ([place]) 06/02/2017   Leukocytosis 05/29/2017   Anemia 05/29/2017   Leg swelling 02/23/2016   Hand cramps 11/22/2015   GERD (gastroesophageal reflux disease) 02/09/2013   OA (osteoarthritis) of knee 01/29/2012   OA (osteoarthritis of spine) 10/18/2011   Healthcare maintenance 04/25/2011   Hypokalemia 02/26/2008   Moderate persistent asthma 02/10/2008   Hyperlipidemia 08/04/2007   H/O Herpes zoster 08/29/2006   Obesity, Class III, BMI 40-49.9 (morbid obesity) ([place]) 08/29/2006   History of breast cyst 08/29/2006   Essential hypertension 08/27/2006   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     Family History  Problem Relation Age of Onset   Stroke Mother        at young age   Heart disease Mother    Heart attack Father    Heart disease Father    Cancer Sister        Unknown   Stroke Brother    Stroke Brother    Sickle cell anemia Brother    Diabetes Brother    Stroke Brother    Stroke Son    Breast cancer Other    Colon cancer Neg Hx    Esophageal cancer Neg Hx    Pancreatic cancer Neg Hx    Stomach cancer Neg Hx    Liver disease Neg Hx    Rectal cancer Neg Hx     Social  History   Tobacco Use   Smoking status: Never   Smokeless tobacco: Never  Vaping Use   Vaping Use: Never used  Substance Use Topics   Alcohol use: No    Alcohol/week: 0.0 standard drinks   Drug use: No    Home Medications Prior to Admission medications   Medication Sig Start Date End Date Taking? Authorizing Provider  ADVAIR DISKUS 500-50 MCG/DOSE AEPB Inhale 1 puff into the lungs 2 (two) times daily. 10/07/20   [name], MD  albuterol (VENTOLIN HFA) 108 (90 Base) MCG/ACT inhaler INHALE 1-2 PUFFS INTO THE LUNGS EVERY 6 HOURS AS NEEDED FOR WHEEZING OR FOR SHORTNESS OF BREATH 09/01/20   [name], MD  allopurinol (ZYLOPRIM) 300 MG tablet Take 0.5 tablets (150 mg total) by mouth 2 (two) times daily. 12/07/20   [name], MD  amLODipine (NORVASC) 10 MG tablet Take 0.5 tablets (5 mg total) by mouth daily. 10/07/20   [name], MD  apixaban (ELIQUIS) 5 MG TABS tablet Take 1 tablet (5 mg total) by mouth 2 (two) times daily. 10/07/20   [name], MD  aspirin EC 81 MG tablet Take 1 tablet (81 mg total) by mouth daily. 11/03/20   [name], MD  Ensure Max Protein (ENSURE MAX PROTEIN) LIQD Take 330 mLs (11 oz total) by mouth 2 (two) times daily. Patient taking differently: Take 11 oz by mouth 2 (two) times daily. She drinks regular Ensure not Ensure Max and she prefers chocolate 08/04/19   Danford, Suann Larry, MD  fluticasone (FLONASE) 50 MCG/ACT nasal spray SHAKE LIQUID AND INSTILL ONE (1) SPRAY IN EACH NOSTRIL ONCE DAILY 01/30/21   [name], MD  furosemide (LASIX) 20 MG tablet Take 20 mg by mouth daily. 11/16/20   [provider]  furosemide (LASIX) 40 MG tablet Take 1 tablet (40 mg total) by mouth daily. Patient not taking: Reported on 01/27/2021 01/26/21 04/26/21  Rehman, Areeg N, DO  metoprolol tartrate (LOPRESSOR) 50 MG tablet TAKE 1 TABLET BY MOUTH TWICE DAILY 09/01/20   [name], MD  mometasone-formoterol (DULERA) 100-5 MCG/ACT AERO Inhale 2  puffs into the lungs 2 (two) times daily.    [provider]  [facility] powder APPLY TOPICALLY TO AFFECTED AREAS TWICE DAILY 09/01/20   [name], MD  omeprazole (PRILOSEC) 20 MG capsule TAKE 1 CAPSULE BY MOUTH DAILY 01/30/21   [name], MD  potassium chloride SA (KLOR-CON) 20 MEQ tablet Take 0.5 tablets (10 mEq total) by mouth daily. 01/26/21  Rehman, Areeg N, DO  pravastatin (PRAVACHOL) 20 MG tablet Take 1 tablet (20 mg total) by mouth daily. 10/07/20   [name], MD  sodium chloride (OCEAN) 0.65 % SOLN nasal spray Place 1 spray into both nostrils as needed for congestion. 09/29/19   [name], MD    Allergies    Prednisone and Seroquel [quetiapine fumarate]  Review of Systems   Review of Systems  Gastrointestinal:  Positive for hematochezia.  All other systems reviewed and are negative.  Physical Exam Updated Vital Signs BP (!) 97/55 (BP Location: Right Arm)   Pulse (!) 53   Temp 97.8 F (36.6 C) (Oral)   Resp 20   SpO2 100%   Physical Exam Vitals and nursing note reviewed.  80 year old female, resting comfortably and in no acute distress. Vital signs are significant for slow heart rate and low blood pressure. Oxygen saturation is 100%, which is normal. Head is normocephalic and atraumatic. PERRLA, EOMI. Oropharynx is clear. Neck is nontender and supple without adenopathy. JVD is present. Back is nontender and there is no CVA tenderness. Lungs are clear without rales, wheezes, or rhonchi. Chest is nontender. Heart has regular rate and rhythm without murmur. Abdomen is soft, flat, nontender without masses or hepatosplenomegaly and peristalsis is normoactive. Extremities have 3+ edema. Skin is warm and dry without rash. Neurologic: Mental status is normal, cranial nerves are intact, moves all extremities equally.  ED Results / Procedures / Treatments   Labs (all labs ordered are listed, but only abnormal results are displayed) Labs Reviewed  CBC  WITH DIFFERENTIAL/PLATELET - Abnormal; Notable for the following components:      Result Value   WBC 172.8 (*)    RBC 2.00 (*)    Hemoglobin 6.3 (*)    HCT 19.2 (*)    RDW 20.0 (*)    Platelets 142 (*)    Neutro Abs 153.8 (*)    Monocytes Absolute 3.8 (*)    Abs Immature Granulocytes 11.92 (*)    All other components within normal limits  COMPREHENSIVE METABOLIC PANEL - Abnormal; Notable for the following components:   Potassium 5.8 (*)    CO2 18 (*)    Glucose, Bld 61 (*)    BUN 98 (*)    Creatinine, Ser 3.35 (*)    Albumin 3.2 (*)    Alkaline Phosphatase 354 (*)    GFR, Estimated 14 (*)    All other components within normal limits  PROTIME-INR - Abnormal; Notable for the following components:   Prothrombin Time 30.3 (*)    INR 2.9 (*)    All other components within normal limits  SARS CORONAVIRUS 2 (TAT 6-24 HRS)  POC OCCULT BLOOD, ED  SAMPLE TO BLOOD BANK  PREPARE RBC (CROSSMATCH)    EKG [place], BARBERENA [id number] 16-Mar-2021 03:16:58 [facility]-NLD ROUTINE RECORD [id number] (80 yr) Female Black [id number] Loc:0 Technician: [phone] Test ind: Vent. rate 63 BPM PR interval 203 ms QRS duration 109 ms QT/QTcB 434/445 ms P-R-T axes 79 104 98 Sinus rhythm Right axis deviation Low voltage, extremity and precordial leads Borderline repolarization abnormality  Radiology No results found.  Procedures Procedures  CRITICAL CARE Performed by: Delora Fuel Total critical care time: 60 minutes Critical care time was exclusive of separately billable procedures and treating other patients. Critical care was necessary to treat or prevent imminent or life-threatening deterioration. Critical care was time spent personally by me on the following activities: development of treatment plan with patient  and/or surrogate as well as nursing, discussions with consultants, evaluation of patient's response to treatment, examination of patient, obtaining history from  patient or surrogate, ordering and performing treatments and interventions, ordering and review of laboratory studies, ordering and review of radiographic studies, pulse oximetry and re-evaluation of patient's condition.  Medications Ordered in ED Medications  0.9 %  sodium chloride infusion (has no administration in time range)  sodium zirconium cyclosilicate (LOKELMA) packet 10 g (has no administration in time range)  pantoprazole (PROTONIX) 80 mg /NS 100 mL IVPB (has no administration in time range)  pantoprozole (PROTONIX) 80 mg /NS 100 mL infusion (has no administration in time range)  pantoprazole (PROTONIX) injection 40 mg (has no administration in time range)     ED Course  I have reviewed the triage vital signs and the nursing notes.  Pertinent labs & imaging results that were available during my care of the patient were reviewed by me and considered in my medical decision making (see chart for details).  Clinical Course as of 03/16/21 0344  Thu Mar 16, 2021  0004 Notified by lab of leukocytosis of 172.8. Looks like WBC count was 133 two weeks ago. On chart review, hx of leukocytosis and myeloproliferative neoplasm. Also with AKI, elevated INR today. Hgb of 6.3 with baseline around 7-8. Will change to acuity 2. Tech advised to repeat vital signs. No indication for sirs/sepsis evaluation despite WBC count given no hypotension, tachycardia, tachypnea, fever. [id number] Charge RN aware of acuity. Will attempt to expedite placement in acute care area despite difficult circumstances with bed availability and admission holds. [KH]    Clinical Course User Index [KH] [name], PA-C   MDM Rules/Calculators/A&P                         Upper gastrointestinal bleeding and patient with myelodysplastic syndrome and chronic anticoagulation.  Labs drawn at triage showed hemoglobin has dropped to 6.3 compared with 7.1 on 7/29.  Also, WBC is increasing - 172.8 today, 133.7 on 7/29.  Labs also  show evidence of acute kidney injury.  Creatinine was 1.37 on 5/27, 2.68 on 7/29, 3.35 today.  Hyperkalemia is present with potassium 5.8.  ECG is obtained showing low voltage, no changes suggestive of hyperkalemia.  She is given a dose of sodium zirconium cyclosilicate.  Other lab findings include mild thrombocytopenia, not felt to be clinically significant, and elevated alkaline phosphatase which is unchanged from prior values.  Stool sample was sent to the lab and is Hemoccult positive.  She is started on pantoprazole intravenously.  At this point, she is not placed on the emergent reversal of anticoagulant since it has been over 15 hours since her last dose of apixaban, and bleeding does not appear to be especially brisk.  Some of her symptoms may be related to hyperviscosity with rising white blood cell count, may need to consider leukapheresis.  TAnother lab abnormality was elevated alkaline phosphatase, which is actually declining.  Blood transfusion is ordered.  Case is discussed with Dr. Lisabeth Devoid of internal medicine teaching service who agrees to admit the patient.  Final Clinical Impression(s) / ED Diagnoses Final diagnoses:  Upper gastrointestinal bleed  Acute kidney injury (nontraumatic) (HCC)  Chronic anticoagulation  Myelodysplastic syndrome, unspecified ([place])  Chronic heart failure with preserved ejection fraction (HCC)  Hyperkalemia  Thrombocytopenia (HCC)  Elevated alkaline phosphatase in newborn    Rx / DC Orders ED Discharge Orders     None  Delora Fuel, MD [id number]    Delora Fuel, MD [id number] [phone]

## 2021-03-16 NOTE — Progress Notes (Signed)
  Echocardiogram 2D Echocardiogram has been performed.  [name] G Kissie Ziolkowski 03/16/2021, 1:17 PM

## 2021-03-16 NOTE — ED Notes (Signed)
Pt was reciving blood when lab came to get labs. We will try back later.

## 2021-03-16 NOTE — ED Notes (Signed)
Attempted report x 2 

## 2021-03-16 NOTE — ED Notes (Signed)
Patient transported to Ultrasound 

## 2021-03-16 NOTE — ED Notes (Signed)
Admitting MD at bedside.

## 2021-03-16 NOTE — H&P (Addendum)
Date: 03/16/2021               Patient Name:  Erika [name] MRN: [id number]  DOB: September 27, 1940 Age / Sex: 80 y.o., female   PCP: [name], MD         Medical Service: Internal Medicine Teaching Service         Attending Physician: Dr. Evette Doffing, Mallie Mussel, *    First Contact: Dr. Raymondo Band Pager: [phone]  Second Contact: Dr. Posey Pronto Pager: [phone]       After Hours (After 5p/  First Contact Pager: [phone]  weekends / holidays): Second Contact Pager: [phone]   Chief Complaint: GI bleed  History of Present Illness: Ms. Capello is a 80 y.o. female with past medical history of atrial flutter, CHF, HTN, paraspinal lumbar TB, myeloproliferative disorder who presents to ED with complaint of bloody stool. She states that ever since she had a colonoscopy June 24,2022 she has noted bloody stools with occasional diarrhea and that she did not ever notice a return to her regular bowel patterns. She does take Eliquis at home as well as ibuprofen.  She reports 1 bowel movements on a daily basis, they are soft, they are streaked with red.  On occasion she has enough bleeding to turn the toilet bowl red.  Denies any pain in her abdomen, no rectal pain.  She has been eating and drinking normally.  The bloody bowel movements have been going on for over a month at this point.  She came to the emergency department because she was having increasing weakness in both of her legs.  She says it was difficult to walk, she had a significant decline in her functional status, it was hard to get around her house.  She is also having significant shortness of breath.  Denies any chest pain.  Both of her legs are weak, she says they get shaky when she stands up.  She reports having swelling in her lower legs for over the last year, she thinks it has been a little worse than usual lately.  Denies fever, chills, weight loss, chest pain, palpitations, orthopnea, dyspnea on exertion, cough, shortness of breath, wheezing,  night sweats, abdominal pain, diarrhea, constipation, weakness,  Endorses weight gain, leg swelling, blood in stool, occasional dizziness, decreased urine output.  Meds:  Current Meds  Medication Sig   ADVAIR DISKUS 500-50 MCG/DOSE AEPB Inhale 1 puff into the lungs 2 (two) times daily.   albuterol (VENTOLIN HFA) 108 (90 Base) MCG/ACT inhaler INHALE 1-2 PUFFS INTO THE LUNGS EVERY 6 HOURS AS NEEDED FOR WHEEZING OR FOR SHORTNESS OF BREATH   allopurinol (ZYLOPRIM) 300 MG tablet Take 0.5 tablets (150 mg total) by mouth 2 (two) times daily.   amLODipine (NORVASC) 10 MG tablet Take 0.5 tablets (5 mg total) by mouth daily.   apixaban (ELIQUIS) 5 MG TABS tablet Take 1 tablet (5 mg total) by mouth 2 (two) times daily.   aspirin EC 81 MG tablet Take 1 tablet (81 mg total) by mouth daily.   Ensure Max Protein (ENSURE MAX PROTEIN) LIQD Take 330 mLs (11 oz total) by mouth 2 (two) times daily. (Patient taking differently: Take 11 oz by mouth 2 (two) times daily. She drinks regular Ensure not Ensure Max and she prefers chocolate)   fluticasone (FLONASE) 50 MCG/ACT nasal spray SHAKE LIQUID AND INSTILL ONE (1) SPRAY IN EACH NOSTRIL ONCE DAILY (Patient taking differently: Place 1 spray into both nostrils daily as needed for allergies.)  furosemide (LASIX) 40 MG tablet Take 1 tablet (40 mg total) by mouth daily.   metoprolol tartrate (LOPRESSOR) 50 MG tablet TAKE 1 TABLET BY MOUTH TWICE DAILY   mometasone-formoterol (DULERA) 100-5 MCG/ACT AERO Inhale 2 puffs into the lungs 2 (two) times daily.   NYAMYC powder APPLY TOPICALLY TO AFFECTED AREAS TWICE DAILY (Patient taking differently: Apply 1 application topically 2 (two) times daily.)   omeprazole (PRILOSEC) 20 MG capsule TAKE 1 CAPSULE BY MOUTH DAILY   potassium chloride SA (KLOR-CON) 20 MEQ tablet Take 0.5 tablets (10 mEq total) by mouth daily.   pravastatin (PRAVACHOL) 20 MG tablet Take 1 tablet (20 mg total) by mouth daily.   sodium chloride (OCEAN) 0.65 % SOLN  nasal spray Place 1 spray into both nostrils as needed for congestion.     Allergies: Allergies as of 03/16/2021 - Review Complete 03/08/2021  Allergen Reaction Noted   Prednisone Other (See Comments) 02/26/2008   Seroquel [quetiapine fumarate] Nausea Only and Other (See Comments) 12/30/2019   Past Medical History:  Diagnosis Date   Anemia    with menses   Asthma    Atrial flutter, paroxysmal (HCC)    Back pain    status post surgery 2002   Blood transfusion without reported diagnosis    Breast cyst    Excesion with FNA, begnin in 2004.    CHF (congestive heart failure) (HCC)    Degenerative joint disease of spine    Imaging 2005,  Degenerative hypertrophic facet arthritis changes L4-5 and L5-S1.[name]    History of shingles    Recurrent with post herpetic neuralgia.    Hypertension    Lymphadenopathy    Of the mediastinum, Right side CXR 2008, not read on 2010 cxr.    Menopause    Obesity    BMI 54   Psychosis ([place])    Secondary to prednisone.   Shingles    Stasis dermatitis    W/ LE edema, prviously on lasix now on mazxide.    SVT (supraventricular tachycardia) ([place]) 01/2008   one run while hospitalized   Tuberculosis    active TB treated in 2002, hx of paraspinal lumbar TB,     Family History:  Mother: CVA, CAD Father: MI, CAD Sister: cancer Brother: CVA Brother: CVA Brother: sickle cell anemia Brother: diabetes, CVA Son: CVA Niece: breast cancer  Social History: Ms. Nagar lives at home. She denies alcohol, tobacco, or non-prescription drug use.  Review of Systems: A complete ROS was negative except as per HPI.   Physical Exam: Blood pressure (!) 112/44, pulse 65, temperature (!) 97.5 F (36.4 C), temperature source Oral, resp. rate 13, height '5\' 7"'$  (1.702 m), weight 124.7 kg, SpO2 100 %. Physical Exam Vitals and nursing note reviewed.  Constitutional:      Appearance: She is obese.  Eyes:     General: No scleral icterus.    Conjunctiva/sclera:  Conjunctivae normal.  Neck:     Vascular: JVD (bilaterally) present.  Cardiovascular:     Rate and Rhythm: Normal rate and regular rhythm.     Heart sounds: Murmur heard.  Crescendo systolic murmur is present with a grade of 2/6.  Pulmonary:     Effort: Accessory muscle usage present. No respiratory distress.     Breath sounds: Normal breath sounds and air entry.  Abdominal:     General: There is no distension.     Tenderness: no abdominal tenderness     Comments: Possible firmness in area of RUQ however remainder  of abdomen was soft.  Musculoskeletal:     Right lower leg: 2+ Pitting Edema present.     Left lower leg: 2+ Pitting Edema present.  Skin:    General: Skin is warm and dry.  Neurological:     General: No focal deficit present.     Mental Status: She is alert and oriented to person, place, and time.   CBC    Component Value Date/Time   WBC 172.8 (HH) 03/17/2021 2233   RBC 2.00 (L) 04/03/2021 2233   HGB 6.3 (LL) 03/25/2021 2233   HGB 7.1 (L) 03/03/2021 0937   HGB 11.3 (L) 07/24/2017 1402   HCT 19.2 (L) 03/29/2021 2233   HCT 21.9 (L) 06/01/2020 1037   HCT 36.0 07/24/2017 1402   PLT 142 (L) 03/28/2021 2233   PLT 128 (L) 03/03/2021 0937   PLT 172 07/24/2017 1402   MCV 96.0 03/30/2021 2233   MCV 93.3 07/24/2017 1402   MCH 31.5 03/09/2021 2233   MCHC 32.8 04/01/2021 2233   RDW 20.0 (H) 04/04/2021 2233   RDW 15.6 (H) 07/24/2017 1402   LYMPHSABS 2.9 03/14/2021 2233   LYMPHSABS 2.9 07/24/2017 1402   MONOABS 3.8 (H) 03/22/2021 2233   MONOABS 1.8 (H) 07/24/2017 1402   EOSABS 0.3 03/14/2021 2233   EOSABS 1.1 (H) 07/24/2017 1402   BASOSABS 0.1 03/26/2021 2233   BASOSABS 0.2 (H) 07/24/2017 1402   CMP     Component Value Date/Time   NA 137 04/04/2021 2233   NA 144 05/23/2020 1351   NA 140 07/24/2017 1402   K 5.8 (H) 03/30/2021 2233   K 4.2 07/24/2017 1402   CL 107 03/17/2021 2233   CO2 18 (L) 03/08/2021 2233   CO2 27 07/24/2017 1402   GLUCOSE 61 (L) 04/05/2021  2233   GLUCOSE 85 07/24/2017 1402   BUN 98 (H) 03/21/2021 2233   BUN 23 05/23/2020 1351   BUN 18.0 07/24/2017 1402   CREATININE 3.35 (H) 03/08/2021 2233   CREATININE 2.68 (H) 03/03/2021 0937   CREATININE 1.1 07/24/2017 1402   CALCIUM 8.9 03/21/2021 2233   CALCIUM 9.2 07/24/2017 1402   PROT 6.5 04/01/2021 2233   PROT 8.3 07/24/2017 1402   ALBUMIN 3.2 (L) 03/21/2021 2233   ALBUMIN 3.4 (L) 07/24/2017 1402   AST 27 03/14/2021 2233   AST 18 03/03/2021 0937   AST 11 07/24/2017 1402   ALT 18 04/02/2021 2233   ALT 12 03/03/2021 0937   ALT <6 07/24/2017 1402   ALKPHOS 354 (H) 04/04/2021 2233   ALKPHOS 202 (H) 07/24/2017 1402   BILITOT 1.1 03/16/2021 2233   BILITOT 0.7 03/03/2021 0937   BILITOT 0.29 07/24/2017 1402   GFRNONAA 14 (L) 03/23/2021 2233   GFRNONAA 18 (L) 03/03/2021 0937   GFRNONAA 62 06/14/2014 1141   GFRAA 41 (L) 05/23/2020 1351   GFRAA 46 (L) 03/01/2020 0934   GFRAA 72 06/14/2014 1141   PT/INR 30.3/2.9  Immunoglobulins 06/01/2020 IgG 2,521 IgM 175 IgA 319  Other immunology 06/01/2020 Kappa free light chains 126.2 Lambda free light chains 1,684.8 Kappa, lambda light chain ratio 0.07  EKG: personally reviewed my interpretation is sinus rhythm, further interpretation of right axis deviation; low voltage, extremity and precordial leads; borderline repolarization abnormality, increased QRS voltage compared to previous strips.  CXR: personally reviewed my interpretation is cardiomegaly with pulmonary vasculature congestion and enlarged pulmonary arteries which is unchanged from previous interpretations.  Assessment & Plan by Problem:  Ms. Strano is a 80 y.o. female with  past medical history of atrial flutter, CHF, HTN, paraspinal lumbar TB, myeloproliferative disorder who presents to ED with bloody stool admitted for GI bleed complicated by AKI and leukocytosis.   #GI bleed FOBT +. Patient had colonoscopy that was normal in June 2022 but states that since then she  has been experiencing bloody stools with some diarrhea. Hgb 6.3, down from 7.1 02/2021 and 7.5 12/2020. She gets regular transfusions of RBC however she says that she is needing a transfusion sooner than normal. Last transfusion was 03/04/2021 and Hgb prior to that was 7.1. - Discontinue home Eliquis - 2 units PRBC given - Protonix 40 mg IV twice daily  #AKI Patient endorses decreased urine output over the last few months and notes that the color is more tea-colored now. GFR 14 (40 12/2020), though this has fluctuated over the last several months. BUN/Cr 98/3.35.  - F/u urinalysis   #Myeloproliferative neoplasm Patient has history of MPN and tumor lysis syndrome, currently on allopurinol. She has progressively worsening renal function, hyperkalemia, evidence of third spacing with volume depletion given hypotension and wide-spread edema. Leukocytosis of 172.8 with neutrophils 153.8, monocytes 3.8, abs immature granulocytes 11.92. Dohle bodies present. She was transfused 03/04/2021 with RBC after critical labs showed WBC of 133.7.  - Hyperkalemic at 5.8; calcium normal at 8.9 - F/u uric acid - F/u phosphate - F/u LDH - DIC panel with pathologist review - Continue home allopurinol 150 mg twice daily - Calcium gluconate 1g  #HFpEF Patient has bilateral, full lower extremity edema that is 3+ distally and 1+ proximally. She is prescribed Lasix 40 mg once daily and took her last dose yesterday though notes decreased urine output. She is simultaneously hypotensive with BPmin with systolic around 80 noted on physical exam. She denies shortness of breath or trouble breathing, though she has accessory muscle usage while breathing. She is on room air with SpO2 99-100%.  #Asthma Patient denies trouble breathing, wheezing, shortness of breath; no wheezing appreciated on auscultation.  - Albuterol 2.5 mg inhaled every 6 hours PRN wheezing, shortness of breath - Breo ellipta 200-25 mcg, one puff  daily  Diet: NPO VTE: SCDs, no anticoagulation due to risk of GI bleeding. Code status: Full  Dispo: Admit patient to Inpatient with expected length of stay greater than 2 midnights.  Signed: Farrel Gordon, DO 03/16/2021, 5:58 AM  After 5pm on weekdays and 1pm on weekends: On Call pager: [phone]

## 2021-03-16 NOTE — Progress Notes (Addendum)
HD#1 SUBJECTIVE:  Patient Summary: Erika [name] is a 80 y.o. female with past medical history of atrial flutter, CHF, HTN, paraspinal lumbar TB, myeloproliferative disorder who presented with complaint of bloody stool and admitted for acute on chronic anemia.   Overnight Events: No acute events overnight.  Interim History: This is hospital day 1 for Erika [name]. She reports black tarry stools following her colonoscopy over the past few weeks that has since turned bloody with bright red blood noted in toilet bowl. She has about 1 episode of these bowel movements per day. She has persistently become weaker and notes that her legs are most bothersome with swelling. The lower extremity swelling has been progressing over a few months. The patient also reports that she has had a decrease in urine output recently and hasn't urinated in the last 10+ hours since being in the ED.   OBJECTIVE:  Vital Signs: Vitals:   03/16/21 0645 03/16/21 0700 03/16/21 0715 03/16/21 0725  BP: (!) 93/53 (!) 110/58 122/75 114/73  Pulse: 62 62 64 65  Resp: 14 18 (!) 22 15  Temp:  (!) 97.4 F (36.3 C)  (!) 97.4 F (36.3 C)  TempSrc:  Oral  Oral  SpO2: 95% 98% 95% 100%  Weight:      Height:       Supplemental O2: Room Air SpO2: 100 %  Filed Weights   03/16/21 0431  Weight: 124.7 kg     Intake/Output Summary (Last 24 hours) at 03/16/2021 0730 Last data filed at 03/16/2021 0710 Gross per 24 hour  Intake 315 ml  Output --  Net 315 ml   Net IO Since Admission: 315 mL [03/16/21 0730]  Physical Exam: General: Pleasant, chronically ill-appearing female laying in bed. No acute distress. Head: Normocephalic. Atraumatic. CV: RRR. Systolic murmur, rubs, or gallops. 2+ bilateral lower extremity edema. +JVD Pulmonary: Lungs CTAB. Normal effort. No wheezing or rales. Abdominal: Soft, nontender, nondistended. Normal bowel sounds. Extremities: Palpable radial and DP pulses. Normal ROM. Skin: Warm and dry. No  obvious rash or lesions. Neuro: A&Ox3. Moves all extremities. Normal sensation. No focal deficit. Psych: Normal mood and affect   ASSESSMENT/PLAN:  Assessment: Active Problems:   Moderate persistent asthma   Leg swelling   Myeloproliferative disorder (HCC)   AKI (acute kidney injury) ([place])   Chronic heart failure with preserved ejection fraction (HFpEF) ([place])   GI bleed   Plan: #Acute on chronic anemia Patient states that since her screening colonoscopy with polypectomy at the end of June, she has experienced hematochezia, with about 1 episode per day. Ongoing bleeding could be secondary to the recent polypectomy and her anticoagulation. Eliquis was discontinued on admission in the setting of her bleeding. Hb on admission was 6.3, down from 7.1 about 2 weeks ago. Patient was transfused with 2 units of RBCs. - Continue to monitor CBC - Transfuse if Hb <7 - SCDs for VTE prophylaxis due to bleeding risk  #Acute on chronic heart failure with preserved ejection fraction Last echo >18 months ago. Patient is hypervolemic on exam today, with jugular venous distention and bilateral lower extremity edema noted. Repeat echo today showed EF of 60-65% and no regional wall abnormalities noted. Right atrial size is massively dilated. Diuresed patient with one dose of IV Lasix 80 mg - Repeat BMP - Will reassess volume status   #Myeloproliferative neoplasm #Leukocytosis Patient is primarily managed by Dr. Irene Limbo for her myeloproliferative neoplasm, for which she states that she was diagnosed with a few years  ago. It has manifested as a neutrophil predominant leukocytosis. White blood cell count is usually around 100, however, it is elevated to 173 on admission. No signs of infection are noted. UA positive for pyuria and bacteriuria, with urine culture still pending. Blood cultures also pending to rule out invasive infection, as this patient is immunocompromised.  - Ceftriaxone 1g/day started  empirically - Blood cultures pending  #Acute kidney injury on chronic kidney disease IIIb AKI could be secondary to acute on chronic anemia, as above, or could be secondary to her volume overload. Patient received 1 dose of IV Lasix '80mg'$  and will have her renal function monitored. She notes that she has had a decrease in urine output over the last couple of months, as well.  - Renal US to rule out obstruction - Urinary cath if patient is retaining urine >250cc  #Electrolyte abnormalities Patient was hyperkalemic on presentation, with K of 5.8. She received calcium gluconate and lokelma, and repeat K was 5. Phosphorus also elevated at 5.9, uric acid elevated to 9.9. Tumor lysis syndrome is less likely given the fact that the patient has not received any cytotoxic therapies recently and the syndrome is more common with Burkitts lymphoma, AML, ALL, CML, and ALL.   - Continue to monitor electrolytes  Best Practice: Diet: clear liquids IVF: none VTE: SCDs Start: 03/16/21 0445 Code: Full AB: ceftriaxone  Therapy Recs: Pending Family Contact: Gerald Stabs and [name] (sons), to be notified. DISPO: Anticipated discharge to Home pending Medical stability.  Signature: Buddy Duty, D.O.  Internal Medicine Resident, PGY-1 [facility]  Pager: [phone] 7:30 AM, 03/16/2021   Please contact the on call pager after 5 pm and on weekends at [phone].

## 2021-03-16 NOTE — Progress Notes (Signed)
IMTS Interval Progress Note:  Paged by RN regarding hypotension with systolic's in [id number] and diastolic ranging [id number] with MAP's <60. Patient evaluated at bedside.  She is resting comfortably in bed. She denies any pain at this time. Denies any dyspnea. She has not had a bowel movement. She reports feeling well enough to dance.   Blood pressure (!) 94/53, pulse 65, temperature (!) 97.5 F (36.4 C), temperature source Oral, resp. rate 17, height '5\' 7"'$  (1.702 m), weight 124.7 kg, SpO2 95 %. Physical Exam  Constitutional: Elderly female, No acute distress.  Cardiovascular: Normal rate, regular rhythm, S1 and S2 present, Distal pulses intact Respiratory: No respiratory distress, no accessory muscle use. CTAB Effort normal on room air  GI: Nondistended, soft, nontender to palpation, normal bowel sounds Musculoskeletal: Normal bulk and tone.  Persistent 2+ pitting edema of BLE, well perfused  Neurological: Is alert and oriented x4 Skin: Warm and dry.  No rash, erythema, lesions noted.   ASSESSMENT/PLAN: Patient is admitted for acute on chronic anemia in setting of GI bleed with acute on chronic HFpEF exacerbation and acute on chronic renal failure with urinalysis concerning for possible UTI for which she was started on Rocephin. She has not had a bowel movement since admission. However, has had progressively worsening hypotension with SBP in 90's and MAP's 57-63. She appears hypervolemic on exam and has only had 500cc UOP with IV Lasix '80mg'$  this AM. Asymptomatic at this time. Extremities are well perfused at this time; hypotension is less likely to be in setting of cardiogenic shock.   - CBC and BMP STAT - Will hold off on fluid resuscitation at this time given that she continues to be significantly hypervolemic

## 2021-03-17 ENCOUNTER — Inpatient Hospital Stay (HOSPITAL_COMMUNITY): Payer: PPO

## 2021-03-17 DIAGNOSIS — K922 Gastrointestinal hemorrhage, unspecified: Secondary | ICD-10-CM | POA: Diagnosis not present

## 2021-03-17 DIAGNOSIS — I480 Paroxysmal atrial fibrillation: Secondary | ICD-10-CM | POA: Diagnosis not present

## 2021-03-17 DIAGNOSIS — D469 Myelodysplastic syndrome, unspecified: Secondary | ICD-10-CM | POA: Diagnosis not present

## 2021-03-17 DIAGNOSIS — D649 Anemia, unspecified: Secondary | ICD-10-CM

## 2021-03-17 DIAGNOSIS — Z7901 Long term (current) use of anticoagulants: Secondary | ICD-10-CM | POA: Diagnosis not present

## 2021-03-17 DIAGNOSIS — K921 Melena: Secondary | ICD-10-CM

## 2021-03-17 LAB — BLOOD CULTURE ID PANEL (REFLEXED) - BCID2

## 2021-03-17 LAB — CBC
HCT: 22.4 % — ABNORMAL LOW (ref 36.0–46.0)
HCT: 21.6 % — ABNORMAL LOW (ref 36.0–46.0)
Hemoglobin: 7.1 g/dL — ABNORMAL LOW (ref 12.0–15.0)
Hemoglobin: 7.1 g/dL — ABNORMAL LOW (ref 12.0–15.0)
MCH: 30.1 pg (ref 26.0–34.0)
MCH: 31 pg (ref 26.0–34.0)
MCHC: 31.7 g/dL (ref 30.0–36.0)
MCHC: 32.9 g/dL (ref 30.0–36.0)
MCV: 94.9 fL (ref 80.0–100.0)
MCV: 94.3 fL (ref 80.0–100.0)
Platelets: 129 10*3/uL — ABNORMAL LOW (ref 150–400)
Platelets: 120 10*3/uL — ABNORMAL LOW (ref 150–400)
RBC: 2.36 MIL/uL — ABNORMAL LOW (ref 3.87–5.11)
RBC: 2.29 MIL/uL — ABNORMAL LOW (ref 3.87–5.11)
RDW: 19.1 % — ABNORMAL HIGH (ref 11.5–15.5)
RDW: 18.9 % — ABNORMAL HIGH (ref 11.5–15.5)
WBC: 162.1 10*3/uL (ref 4.0–10.5)
WBC: 169.6 10*3/uL (ref 4.0–10.5)
nRBC: 0 % (ref 0.0–0.2)
nRBC: 0 % (ref 0.0–0.2)

## 2021-03-17 LAB — SAMPLE TO BLOOD BANK

## 2021-03-17 LAB — PROTIME-INR
INR: 2 — ABNORMAL HIGH (ref 0.8–1.2)
Prothrombin Time: 22.5 seconds — ABNORMAL HIGH (ref 11.4–15.2)

## 2021-03-17 LAB — LACTIC ACID, PLASMA
Lactic Acid, Venous: 2.7 mmol/L (ref 0.5–1.9)
Lactic Acid, Venous: 2.9 mmol/L (ref 0.5–1.9)

## 2021-03-17 LAB — PATHOLOGIST SMEAR REVIEW

## 2021-03-17 LAB — GLUCOSE, CAPILLARY
Glucose-Capillary: 73 mg/dL (ref 70–99)
Glucose-Capillary: 102 mg/dL — ABNORMAL HIGH (ref 70–99)
Glucose-Capillary: 88 mg/dL (ref 70–99)
Glucose-Capillary: 96 mg/dL (ref 70–99)
Glucose-Capillary: 105 mg/dL — ABNORMAL HIGH (ref 70–99)

## 2021-03-17 LAB — BASIC METABOLIC PANEL
Anion gap: 14 (ref 5–15)
Anion gap: 10 (ref 5–15)
BUN: 102 mg/dL — ABNORMAL HIGH (ref 8–23)
BUN: 103 mg/dL — ABNORMAL HIGH (ref 8–23)
CO2: 17 mmol/L — ABNORMAL LOW (ref 22–32)
CO2: 20 mmol/L — ABNORMAL LOW (ref 22–32)
Calcium: 8.9 mg/dL (ref 8.9–10.3)
Calcium: 8.5 mg/dL — ABNORMAL LOW (ref 8.9–10.3)
Chloride: 107 mmol/L (ref 98–111)
Chloride: 108 mmol/L (ref 98–111)
Creatinine, Ser: 3.19 mg/dL — ABNORMAL HIGH (ref 0.44–1.00)
Creatinine, Ser: 3.15 mg/dL — ABNORMAL HIGH (ref 0.44–1.00)
GFR, Estimated: 14 mL/min — ABNORMAL LOW (ref >60)
GFR, Estimated: 15 mL/min — ABNORMAL LOW (ref >60)
Glucose, Bld: 39 mg/dL — CL (ref 70–99)
Glucose, Bld: 106 mg/dL — ABNORMAL HIGH (ref 70–99)
Potassium: 5.3 mmol/L — ABNORMAL HIGH (ref 3.5–5.1)
Potassium: 4.9 mmol/L (ref 3.5–5.1)
Sodium: 138 mmol/L (ref 135–145)
Sodium: 138 mmol/L (ref 135–145)

## 2021-03-17 LAB — MAGNESIUM: Magnesium: 2.1 mg/dL (ref 1.7–2.4)

## 2021-03-17 LAB — HEMOGLOBIN AND HEMATOCRIT, BLOOD
HCT: 24.2 % — ABNORMAL LOW (ref 36.0–46.0)
Hemoglobin: 7.7 g/dL — ABNORMAL LOW (ref 12.0–15.0)

## 2021-03-17 LAB — HEPATIC FUNCTION PANEL
ALT: 18 U/L (ref 0–44)
AST: 26 U/L (ref 15–41)
Albumin: 3.1 g/dL — ABNORMAL LOW (ref 3.5–5.0)
Alkaline Phosphatase: 342 U/L — ABNORMAL HIGH (ref 38–126)
Bilirubin, Direct: 0.5 mg/dL — ABNORMAL HIGH (ref 0.0–0.2)
Indirect Bilirubin: 0.2 mg/dL — ABNORMAL LOW (ref 0.3–0.9)
Total Bilirubin: 0.7 mg/dL (ref 0.3–1.2)
Total Protein: 6.3 g/dL — ABNORMAL LOW (ref 6.5–8.1)

## 2021-03-17 LAB — UREA NITROGEN, URINE: Urea Nitrogen, Ur: 501 mg/dL

## 2021-03-17 LAB — FIBRINOGEN: Fibrinogen: 174 mg/dL — ABNORMAL LOW (ref 210–475)

## 2021-03-17 LAB — PREPARE RBC (CROSSMATCH)

## 2021-03-17 LAB — APTT: aPTT: 47 seconds — ABNORMAL HIGH (ref 24–36)

## 2021-03-17 LAB — SAVE SMEAR(SSMR), FOR PROVIDER SLIDE REVIEW: Smear Review: NONE SEEN

## 2021-03-17 LAB — PHOSPHORUS: Phosphorus: 5.9 mg/dL — ABNORMAL HIGH (ref 2.5–4.6)

## 2021-03-17 MED ORDER — FUROSEMIDE 10 MG/ML IJ SOLN
40.0000 mg | Freq: Once | INTRAMUSCULAR | Status: AC
  Administered 2021-03-17: 40 mg via INTRAVENOUS

## 2021-03-17 MED ORDER — ALLOPURINOL 100 MG PO TABS
150.0000 mg | ORAL_TABLET | Freq: Every day | ORAL | Status: DC
  Administered 2021-03-18 – 2021-03-22 (×5): 150 mg via ORAL
  Filled 2021-03-17 (×5): qty 1

## 2021-03-17 MED ORDER — VANCOMYCIN HCL 500 MG/100ML IV SOLN
500.0000 mg | INTRAVENOUS | Status: DC
  Administered 2021-03-18: 500 mg via INTRAVENOUS
  Filled 2021-03-17: qty 100

## 2021-03-17 MED ORDER — SODIUM CHLORIDE 0.9 % IV SOLN
2.0000 g | INTRAVENOUS | Status: DC
  Administered 2021-03-17 – 2021-03-18 (×2): 2 g via INTRAVENOUS
  Filled 2021-03-17 (×3): qty 2

## 2021-03-17 MED ORDER — SODIUM ZIRCONIUM CYCLOSILICATE 5 G PO PACK
5.0000 g | PACK | Freq: Once | ORAL | Status: AC
  Administered 2021-03-17: 5 g via ORAL
  Filled 2021-03-17: qty 1

## 2021-03-17 MED ORDER — PANTOPRAZOLE SODIUM 40 MG IV SOLR: 40.0000 mg | Freq: Two times a day (BID) | INTRAVENOUS | Status: DC

## 2021-03-17 MED ORDER — DEXTROSE 50 % IV SOLN
INTRAVENOUS | Status: AC
  Administered 2021-03-17: 50 mL
  Filled 2021-03-17: qty 50

## 2021-03-17 MED ORDER — VANCOMYCIN HCL 1250 MG/250ML IV SOLN
1250.0000 mg | Freq: Once | INTRAVENOUS | Status: AC
  Administered 2021-03-17: 1250 mg via INTRAVENOUS
  Filled 2021-03-17: qty 250

## 2021-03-17 MED ORDER — FUROSEMIDE 10 MG/ML IJ SOLN
80.0000 mg | Freq: Once | INTRAMUSCULAR | Status: DC
  Filled 2021-03-17: qty 8

## 2021-03-17 MED ORDER — PANTOPRAZOLE INFUSION (NEW) - SIMPLE MED
8.0000 mg/h | INTRAVENOUS | Status: DC
  Administered 2021-03-17 – 2021-03-18 (×2): 8 mg/h via INTRAVENOUS
  Filled 2021-03-17: qty 80
  Filled 2021-03-17: qty 100
  Filled 2021-03-17: qty 80

## 2021-03-17 MED ORDER — SODIUM CHLORIDE 0.9% IV SOLUTION: Freq: Once | INTRAVENOUS | Status: AC

## 2021-03-17 NOTE — Progress Notes (Signed)
Serum glucose 39, 1 amp D50 given. Patient asymptomatic

## 2021-03-17 NOTE — Progress Notes (Signed)
Received call from the lab critical lab value of Lactic acid 2.7. MD notified

## 2021-03-17 NOTE — Progress Notes (Signed)
Pharmacy Antibiotic Note  Erika [name] is a 80 y.o. female admitted on 03/10/2021 with sepsis.  Pharmacy has been consulted for vancomycin and cefepime dosing.  Patient admitted with AKI on CKD and WBC 160s on admit with known myeloproliferative d/o and past WBCs 50-100. Urine culture growing GNR. Previously grew klebsiella sensitive to cefazolin, team is aware. Patient ClCr is 20 ml/min and at high risk of worsening AKI. Will give lower loading dose, narrow antibiotics as able and watch closely.  8/12 Vancomycin '500mg'$  Q 24 hr Scr used: 3.19 mg/dL Weight: 131.7 kg Vd coeff: 0.5 L/kg Est AUC: 470  Plan: Vancomycin '1250mg'$  x1 load then '500mg'$  Q24 hr  Cefepime 2g q24 hr  Monitor cultures, clinical status, renal fx, vanc levels  Narrow abx as able and f/u duration    Height: '5\' 7"'$  (170.2 cm) Weight: 131.7 kg (290 lb 5.5 oz) IBW/kg (Calculated) : 61.6  Temp (24hrs), Avg:97.8 F (36.6 C), Min:97.5 F (36.4 C), Max:98.1 F (36.7 C)  Recent Labs  Lab 03/28/2021 2233 03/16/21 1119 03/16/21 1903 03/17/21 0217 03/17/21 0838  WBC 172.8* 158.0* 162.4* 162.1*  --   CREATININE 3.35* 3.32* 3.34* 3.19*  --   LATICACIDVEN  --   --   --   --  2.7*    Estimated Creatinine Clearance: 20.6 mL/min (A) (by C-G formula based on SCr of 3.19 mg/dL (H)).    Allergies  Allergen Reactions   Prednisone Other (See Comments)    REACTION: Psychosis, talking out of head, insomnia   Seroquel [Quetiapine Fumarate] Nausea Only and Other (See Comments)    Caused very dry mouth    Antimicrobials this admission: Vanc 8/12 >>  Cefe 8/12 >>  CTX 8/11 x1  Dose adjustments this admission: N/a  Microbiology results: 8/11 BCx: ngtd 8/11 UCx: GNR    Thank you for allowing pharmacy to be a part of this patient's care.  Benetta Spar, PharmD, BCPS, BCCP Clinical Pharmacist  Please check AMION for all [place] phone numbers After 10:00 PM, call [place] [phone]

## 2021-03-17 NOTE — TOC Initial Note (Signed)
Transition of Care [facility]) - Initial/Assessment Note    Patient Details  Name: BEATRIS COFER MRN: [id number] Date of Birth: 1941-03-11  Transition of Care [facility] CM/SW Contact:    Verdell Carmine, RN Phone Number: 03/17/2021, 4:39 PM  Clinical Narrative:                  MS MIcKInnon is a 80 year old female admitted for  complaints of bloody stools. She has many comorbid factors, one being Myeloproliferative neoplasm causing anemia. , H&H 7.1/21 this afternoon. She lives in a apartment and has family to call on. Patient may need HH for strengthening, PPT will make recommendations. CM will follow for needs and and transistions.  Expected Discharge Plan: [place] Barriers to Discharge: Continued Medical Work up   Patient Goals and CMS Choice        Expected Discharge Plan and Services Expected Discharge Plan: [place]       Living arrangements for the past 2 months: Apartment                                      Prior Living Arrangements/Services Living arrangements for the past 2 months: Apartment Lives with:: Self Patient language and need for interpreter reviewed:: Yes        Need for Family Participation in Patient Care: Yes (Comment) Care giver support system in place?: Yes (comment)   Criminal Activity/Legal Involvement Pertinent to Current Situation/Hospitalization: No - Comment as needed  Activities of Daily Living Home Assistive Devices/Equipment: Hospital bed, Walker (specify type), Trapeze ADL Screening (condition at time of admission) Patient's cognitive ability adequate to safely complete daily activities?: Yes Is the patient deaf or have difficulty hearing?: No Does the patient have difficulty seeing, even when wearing glasses/contacts?: No Does the patient have difficulty concentrating, remembering, or making decisions?: No  Permission Sought/Granted                  Emotional Assessment        Orientation: : Oriented to Self, Oriented to Place, Oriented to  Time, Oriented to Situation   Psych Involvement: No (comment)  Admission diagnosis:  Hyperkalemia [E87.5] Thrombocytopenia ([place]) [D69.6] GI bleed [K92.2] Upper gastrointestinal bleed [K92.2] Chronic anticoagulation [Z79.01] Myelodysplastic syndrome, unspecified ([place]) [D46.9] Acute kidney injury (nontraumatic) ([place]) [N17.9] AKI (acute kidney injury) ([place]) [N17.9] Elevated alkaline phosphatase in newborn [P09.8, R74.8] Chronic heart failure with preserved ejection fraction ([place]) [I50.32] Patient Active Problem List   Diagnosis Date Noted   GI bleed 03/16/2021   Dry mouth 10/30/2019   Acute on chronic heart failure with preserved ejection fraction (HFpEF) ([place]) 09/09/2019   Aortic stenosis 08/22/2019   General weakness    Tumor lysis syndrome    Hyperuricemia    Thrombocytopenia ([place])    Myeloproliferative disorder ([place]) 07/10/2019   Acute on chronic renal failure ([place]) 07/10/2019   AF (paroxysmal atrial fibrillation) ([place]) 06/02/2017   Leukocytosis 05/29/2017   Anemia 05/29/2017   Leg swelling 02/23/2016   Hand cramps 11/22/2015   GERD (gastroesophageal reflux disease) 02/09/2013   OA (osteoarthritis) of knee 01/29/2012   OA (osteoarthritis of spine) 10/18/2011   Healthcare maintenance 04/25/2011   Hypokalemia 02/26/2008   Moderate persistent asthma 02/10/2008   Hyperlipidemia 08/04/2007   H/O Herpes zoster 08/29/2006   Obesity, Class III, BMI 40-49.9 (morbid obesity) ([place]) 08/29/2006   History of  breast cyst 08/29/2006   Essential hypertension 08/27/2006   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 [place] [address] Phone: [phone] Fax: [phone]     Social Determinants of Health ([place]) Interventions    Readmission Risk Interventions Readmission Risk Prevention Plan 08/26/2019 08/04/2019  Transportation Screening Complete Complete  PCP or Specialist Appt within 3-5 Days - Complete  HRI or [place] - Complete  Social Work Consult for [place] Planning/Counseling - Complete  Palliative Care Screening - Not Applicable  Medication Review Press photographer) Complete Complete  HRI or Home Care Consult Complete -  SW Recovery Care/Counseling Consult Complete -  Palliative Care Screening Not Applicable -  [place] Not Applicable -  Some recent data might be hidden

## 2021-03-17 NOTE — Plan of Care (Addendum)
PCCM Plan of Care Note  80yF with CHF, atrial flutter, moderate AS, MDS here with acute blood loss anemia with melena. No further episodes of melena since this morning and no hematemesis. Transfusing 3rd unit of blood with MAPs in 60s-70s. Alert and looks comfortable on 2L O2 with normal respiratory effort. INR 2 but may not accurately reflect true coagulopathy in setting prior apixaban exposure although this should be washed out now. GI to perform EGD tomorrow.   - reasonable to target map 55-60 until intervention to help promote coagulation of whatever's bleeding - if giving another unit after this would consider platelet transfusion for more balanced transfusion, IV calcium gluconate (her calcium phosphate product is <55, I think a one time dose would be safe) -  Although INR may not accurately reflect true coagulopathy I don't think it would be crazy to give IV vitamin K 10 mg if she's having ongoing bleeding later and would like to avoid excessive volume - PCCM glad to reassess if there is deterioration  [place]

## 2021-03-17 NOTE — Progress Notes (Signed)
Serum glucose 35, 1 amp D50 given. Patient asymptomatic

## 2021-03-17 NOTE — Consult Note (Addendum)
Attending physician's note   I have taken an interval history, reviewed the chart and examined the patient. I agree with the Advanced Practitioner's note, impression, and recommendations as outlined.   80 year old female with medical history as outlined below, to include history of atrial flutter (on Eliquis), CHF, HTN, aortic stenosis, myelodysplastic syndrome, admitted with weakness and dizziness in the setting of melena for the last 3 days or so.  She states she has been having dark stools intermittently for the last month or so.  No nausea/vomiting.  Did have colonoscopy on 01/27/2021 with removal of 3 subcentimeter tubular adenomas.  No subsequent hematochezia or abdominal pain.  Admission labs on 8/10 notable for the following: - WBC 172 (myelodysplastic syndrome) - H/H 6.3/19.2 with MCV/RDW 96/20 (baseline Hgb ~7.5-8) - BUN/creatinine 98/3.35 (baseline creatinine ~1.4-1.6) - INR 2.9  Has been transfused 2 units PRBCs with repeat Hgb 7.1 this morning.  Transfusing third unit today.  1) Acute blood loss anemia superimposed on anemia of chronic disease 2) Melena 3) History of atrial flutter 4) Chronic anticoagulation 5) Morbid Obesity 6) Aortic stenosis 7) Supratherapeutic INR (2.9 on admission)  - Holding Eliquis since 8/10 - Agree with changing to high-dose IV Protonix - Posttransfusion CBC - Continue serial H/H checks with additional blood products as needed per protocol - INR 2.0 today.  Ideally <1.7 to facilitate therapeutic endoscopic management - N.p.o. at midnight for EGD tomorrow - EGD tomorrow for diagnostic and therapeutic intent - Discussed elevated periprocedural risks due to underlying comorbidities  8) AKI on CKD3 9) UTI - Management per primary Medicine service  10) Myelodysplastic syndrome  The indications, risks, and benefits of EGD were explained to the patient and her family member at bedside in detail. Risks include but are not limited to  bleeding, perforation, adverse reaction to medications, and cardiopulmonary compromise. Sequelae include but are not limited to the possibility of surgery, hospitalization, and mortality. The patient verbalized understanding and wished to proceed.     [id number] DO, FACG [phone] office        Consultation  Referring Provider: Internal medicine service/[place] Primary Care Physician:  [name], MD Primary Gastroenterologist:  Lemmie Evens  Reason for Consultation: Dark stool, anemia, heme positive  HPI: Erika [name] is a 80 y.o. female who with multiple comorbidities, on chronic Eliquis for history of atrial flutter, compensated congestive heart failure, hypertension, aortic stenosis and history of myelodysplastic syndrome who was admitted last evening after she presented to the emergency room with complaints of progressive weakness over the past 3 days and some dizziness.  She also reported dark stool over the past 3 days. Reported last dose of Eliquis a.m. 03/13/2021.   She had undergone colonoscopy at the [facility] on 01/27/2021 per Dr. Tarri Glenn for follow-up of adenomatous colon polyps she was found to have 3 small polyps the largest 4 mm all were removed and consistent with tubular adenomas.  No prior EGD in our system.  Reviewing her labs, March 2022 hemoglobin 8.3/May 2022 hemoglobin 7.5 Yesterday hemoglobin 7.3 hematocrit 22.7 WBC 158,000, platelets 129 Potassium 5.7/BUN 104/creatinine 3.3 Pro time/INR pending Lactate 2.7 2D echo yesterday EF 60 to 65%, moderate aortic valve stenosis  Patient did have a large tarry bowel movement this morning per nurse.  Patient has no complaints of abdominal pain, has not had any recent nausea or vomiting, no dysphagia or odynophagia.  She says her stools have been somewhat looser recently.  She has not noted any bright  red blood per rectum.  She says she has been feeling quite weak over the past couple of days and somewhat sleepy.   No dysuria.  UA positive greater than 50 WBCs many bacteria-culture pending-prelim greater than 100,000 colonies E. Coli- On IV antibiotics.  She was transfused 2 units of packed RBCs last p.m.-and hemoglobin 7.1 this a.m.  She has had some mild hypotension this morning, current BP 98/50, pulse 80s   Past Medical History:  Diagnosis Date   Anemia    with menses   Asthma    Atrial flutter, paroxysmal (HCC)    Back pain    status post surgery 2002   Blood transfusion without reported diagnosis    Breast cyst    Excesion with FNA, begnin in 2004.    CHF (congestive heart failure) (HCC)    Degenerative joint disease of spine    Imaging 2005,  Degenerative hypertrophic facet arthritis changes L4-5 and L5-S1.[name]    History of shingles    Recurrent with post herpetic neuralgia.    Hypertension    Lymphadenopathy    Of the mediastinum, Right side CXR 2008, not read on 2010 cxr.    Menopause    Obesity    BMI 54   Psychosis ([place])    Secondary to prednisone.   Shingles    Stasis dermatitis    W/ LE edema, prviously on lasix now on mazxide.    SVT (supraventricular tachycardia) ([place]) 01/2008   one run while hospitalized   Tuberculosis    active TB treated in 2002, hx of paraspinal lumbar TB,     Past Surgical History:  Procedure Laterality Date   BREAST CYST ASPIRATION Left    Breast cyst biopsy     CHOLECYSTECTOMY  02/02/2012   Procedure: LAPAROSCOPIC CHOLECYSTECTOMY;  Surgeon: Zenovia Jarred, MD;  Location: [place];  Service: General;  Laterality: N/A;   Hemilaminectomy of L4, L5 and S1 decompression of tumor in epidural space  2000   IR FLUORO GUIDED NEEDLE PLC ASPIRATION/INJECTION LOC  07/17/2019    Prior to Admission medications   Medication Sig Start Date End Date Taking? Authorizing Provider  ADVAIR DISKUS 500-50 MCG/DOSE AEPB Inhale 1 puff into the lungs 2 (two) times daily. 10/07/20  Yes [name], MD  albuterol (VENTOLIN HFA) 108 (90 Base) MCG/ACT inhaler  INHALE 1-2 PUFFS INTO THE LUNGS EVERY 6 HOURS AS NEEDED FOR WHEEZING OR FOR SHORTNESS OF BREATH 09/01/20  Yes [name], MD  allopurinol (ZYLOPRIM) 300 MG tablet Take 0.5 tablets (150 mg total) by mouth 2 (two) times daily. 12/07/20  Yes [name], MD  amLODipine (NORVASC) 10 MG tablet Take 0.5 tablets (5 mg total) by mouth daily. 10/07/20  Yes [name], MD  apixaban (ELIQUIS) 5 MG TABS tablet Take 1 tablet (5 mg total) by mouth 2 (two) times daily. 10/07/20  Yes [name], MD  aspirin EC 81 MG tablet Take 1 tablet (81 mg total) by mouth daily. 11/03/20  Yes [name], MD  Ensure Max Protein (ENSURE MAX PROTEIN) LIQD Take 330 mLs (11 oz total) by mouth 2 (two) times daily. Patient taking differently: Take 11 oz by mouth 2 (two) times daily. She drinks regular Ensure not Ensure Max and she prefers chocolate 08/04/19  Yes Danford, Suann Larry, MD  fluticasone (FLONASE) 50 MCG/ACT nasal spray SHAKE LIQUID AND INSTILL ONE (1) SPRAY IN EACH NOSTRIL ONCE DAILY Patient taking differently: Place 1 spray into both nostrils  daily as needed for allergies. 01/30/21  Yes [name], MD  furosemide (LASIX) 40 MG tablet Take 1 tablet (40 mg total) by mouth daily. 01/26/21 04/26/21 Yes Rehman, Areeg N, DO  metoprolol tartrate (LOPRESSOR) 50 MG tablet TAKE 1 TABLET BY MOUTH TWICE DAILY 09/01/20  Yes [name], MD  [facility] powder APPLY TOPICALLY TO AFFECTED AREAS TWICE DAILY Patient taking differently: Apply 1 application topically 2 (two) times daily. 09/01/20  Yes [name], MD  omeprazole (PRILOSEC) 20 MG capsule TAKE 1 CAPSULE BY MOUTH DAILY 01/30/21  Yes [name], MD  potassium chloride SA (KLOR-CON) 20 MEQ tablet Take 0.5 tablets (10 mEq total) by mouth daily. 01/26/21  Yes Rehman, Areeg N, DO  pravastatin (PRAVACHOL) 20 MG tablet Take 1 tablet (20 mg total) by mouth daily. 10/07/20  Yes [name], MD  sodium chloride (OCEAN) 0.65 % SOLN nasal spray Place 1 spray into  both nostrils as needed for congestion. 09/29/19  Yes [name], MD    Current Facility-Administered Medications  Medication Dose Route Frequency Provider Last Rate Last Admin   0.9 %  sodium chloride infusion (Manually program via Guardrails IV Fluids)   Intravenous Once Atway, Rayann N, DO       albuterol (PROVENTIL) (2.5 MG/3ML) 0.083% nebulizer solution 2.5 mg  2.5 mg Inhalation Q6H PRN Iona Beard, MD   2.5 mg at 03/16/21 [id number]   allopurinol (ZYLOPRIM) tablet 150 mg  150 mg Oral BID Iona Beard, MD   150 mg at 03/17/21 1218   ceFEPIme (MAXIPIME) 2 g in sodium chloride 0.9 % 100 mL IVPB  2 g Intravenous Q24H Bridgett Larsson, Lydia D, RPH       fluticasone furoate-vilanterol (BREO ELLIPTA) 200-25 MCG/INH 1 puff  1 puff Inhalation Daily Iona Beard, MD       pantoprazole (PROTONIX) injection 40 mg  40 mg Intravenous Q12H Iona Beard, MD   40 mg at 03/17/21 0855   vancomycin (VANCOREADY) IVPB 1250 mg/250 mL  1,250 mg Intravenous Once Donnamae Jude, RPH 166.7 mL/hr at 03/17/21 1217 1,250 mg at 03/17/21 1217   [START ON 03/16/2021] vancomycin (VANCOREADY) IVPB 500 mg/100 mL  500 mg Intravenous Q24H Donnamae Jude, [facility]        Allergies as of 04/03/2021 - Review Complete 03/19/2021  Allergen Reaction Noted   Prednisone Other (See Comments) 02/26/2008   Seroquel [quetiapine fumarate] Nausea Only and Other (See Comments) 12/30/2019    Family History  Problem Relation Age of Onset   Stroke Mother        at young age   Heart disease Mother    Heart attack Father    Heart disease Father    Cancer Sister        Unknown   Stroke Brother    Stroke Brother    Sickle cell anemia Brother    Diabetes Brother    Stroke Brother    Stroke Son    Breast cancer Other    Colon cancer Neg Hx    Esophageal cancer Neg Hx    Pancreatic cancer Neg Hx    Stomach cancer Neg Hx    Liver disease Neg Hx    Rectal cancer Neg Hx     Social History   Socioeconomic History   Marital status: Widowed     Spouse name: Not on file   Number of children: Not on file   Years of education: 11 th grade   Highest education  level: Not on file  Occupational History   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 There are not steps up to the entrance the patient uses. One son stays with her once in a while.      Patient's method of transportation is personal car.      The highest level of education was some high school: 11th grade.      The patient currently retired.      Identified important Relationships are "My friend Mart Piggs, my nieces, my grandkids, a lot of people.       Pets : None       Interests / Fun: "Playing word games on my phone, watch TV, Talking on phone."       Current Stressors: "Nothing, really. I try not to let anything stress me."       Religious / Personal Beliefs: "I believe in God."       L. [place], BSN, RN-BC       Social Determinants of Health   Financial Resource Strain: Not on file  Food Insecurity: Not on file  Transportation Needs: Not on file  Physical Activity: Not on file  Stress: Not on file  Social Connections: Not on file  Intimate Partner Violence: Not on file    Review of Systems: Pertinent positive and negative review of systems were noted in the above HPI section.  All other review of systems was otherwise negative.   Physical Exam: Vital signs in last 24 hours: Temp:  [97.5 F (36.4 C)-98.1 F (36.7 C)] 97.5 F (36.4 C) (08/12  1146) Pulse Rate:  [62-78] 78 (08/12 1146) Resp:  [13-19] 15 (08/12 0825) BP: (88-114)/(40-76) 88/51 (08/12 0825) SpO2:  [91 %-100 %] 100 % (08/12 1146) Weight:  [131.7 kg] 131.7 kg (08/12 0135) Last BM Date: 03/16/21 General:   Alert,  Well-developed, ill-appearing older African-American female pleasant and cooperative in NAD Head:  Normocephalic and atraumatic. Eyes:  Sclera clear, no icterus.   Conjunctiva pale. Ears:  Normal auditory acuity. Nose:  No deformity, discharge,  or lesions. Mouth:  No deformity or lesions.   Neck:  Supple; no masses or thyromegaly. Lungs: Scattered basilar rales and rhonchi  heart:  Regular rate and rhythm; soft murmur, clicks, rubs,  or gallops. Abdomen:  Soft,nontender, BS active,nonpalp mass or hsm.   Rectal: Documented melena Msk:  Symmetrical without gross deformities. . Pulses:  Normal pulses noted. Extremities:  Without clubbing or edema. Neurologic:  Alert and  oriented x4;  grossly normal neurologically. Skin:  Intact without significant lesions or rashes.. Psych:  Alert and cooperative. Normal mood and affect.  Intake/Output from previous day: 08/11 0701 - 08/12 0700 In: 540 [P.O.:125; Blood:315; IV Piggyback:100] Out: 500 [Urine:500] Intake/Output this shift: Total I/O In: 480 [P.O.:480] Out: -   Lab Results: Recent Labs    03/16/21 1119 03/16/21 1903 03/17/21 0217  WBC 158.0* 162.4* 162.1*  HGB  7.3* 7.2* 7.1*  HCT 22.7* 22.9* 22.4*  PLT 129* 130* 129*   BMET Recent Labs    03/16/21 1119 03/16/21 1903 03/17/21 0217  NA 139 138 138  K 5.0 5.7* 5.3*  CL 106 105 107  CO2 20* 19* 17*  GLUCOSE 42* 35* 39*  BUN 100* 104* 102*  CREATININE 3.32* 3.34* 3.19*  CALCIUM 8.8* 8.8* 8.9   LFT Recent Labs    03/17/21 0838  PROT 6.3*  ALBUMIN 3.1*  AST 26  ALT 18  ALKPHOS 342*  BILITOT 0.7  BILIDIR 0.5*  IBILI 0.2*   PT/INR Recent Labs    03/12/2021 2233  LABPROT 30.3*  INR 2.9*       IMPRESSION:  #66  80 year old African-American female admitted with 3 to 4-day history of weakness Patient noted dark stools over the past 3 days in setting of Eliquis No prior EGD, she did have colonoscopy June 2022 for follow-up of adenomatous polyps and had 3 tubular adenomas removed. Current blood loss consistent with upper GI bleeding, rule out gastropathy, peptic ulcer disease, esophagitis, AVMs.  Off Eliquis since 03/29/2021-however INR 2.9 on admit  #2 acute on chronic anemia with baseline hemoglobin 7.5-8.5 range #3 myelodysplastic syndrome #4 acute urinary tract infection with SIRS Chronic leukocytosis but current counts significantly higher than her baseline, elevated lactate. #5 acute kidney injury likely secondary to #4, in setting of chronic kidney disease stage III #6 history of atrial flutter-on chronic Eliquis #7.  Compensated congestive heart failure #8.  Moderate aortic stenosis  PLAN: Will change to Protonix infusion Serial hemoglobins, and transfuse to keep hemoglobin 7.5 Patient has eaten today, will make her n.p.o. after midnight and plan for EGD in a.m. tomorrow with Dr. Bryan Lemma, as long as otherwise hemodynamically stable Continue to hold Eliquis  GI will follow with you.   Amy Esterwood PA-C 03/17/2021, 1:01 PM

## 2021-03-17 NOTE — Progress Notes (Addendum)
HD#2 SUBJECTIVE:  Patient Summary: Erika. [name] is a 80 y.o. female with past medical history of atrial flutter, CHF, HTN, paraspinal lumbar TB, myeloproliferative disorder who presented with complaint of bloody stool and admitted for acute on chronic anemia.   Overnight Events: Hypoglycemic with CBG 35 requiring D50. Patient was asymptomatic at the time.  Interim History: This is hospital day 2. Erika Volmar who was seen and evaluated at the bedside this morning. She reports that she had one bowel movement yesterday, but is not aware if there was any blood in it. This morning her nurse reports that she had a loose, large bowel movement that was black and tarry. Also noted that the patient is having some bleeding from her gums. The patient states that she feels really tired at this time, but denies any lightheadedness, dizziness, chest pain, or shortness of breath.   OBJECTIVE:  Vital Signs: Vitals:   03/17/21 0020 03/17/21 0135 03/17/21 0519 03/17/21 0520  BP: (!) 111/58  (!) 93/40 (!) 93/40  Pulse:      Resp: 19   18  Temp: 98 F (36.7 C)  98.1 F (36.7 C)   TempSrc: Oral  Oral   SpO2:      Weight:  131.7 kg    Height:       Supplemental O2: Room Air SpO2: 100 %  Filed Weights   03/16/21 0431 03/17/21 0135  Weight: 124.7 kg 131.7 kg     Intake/Output Summary (Last 24 hours) at 03/17/2021 [id number] Last data filed at 03/17/2021 0600 Gross per 24 hour  Intake 540 ml  Output 500 ml  Net 40 ml   Net IO Since Admission: 40 mL [03/17/21 0614]  Physical Exam: General: Pleasant, chronically ill-appearing female laying in bed. No acute distress. CV: RRR. Systolic murmur, rubs, or gallops. 2+ bilateral lower extremity edema. +JVD Pulmonary: Lungs CTAB. Normal effort. No wheezing or rales. Abdominal: Soft, nontender, nondistended. Normal bowel sounds. Extremities: Palpable radial and DP pulses. Normal ROM. Skin: Warm and dry. No obvious rash or lesions. Neuro: A&Ox3. Moves all  extremities. Normal sensation. No focal deficit. Psych: Normal mood and affect   ASSESSMENT/PLAN:  Assessment: Principal Problem:   GI bleed Active Problems:   AF (paroxysmal atrial fibrillation) (HCC)   Myeloproliferative disorder (HCC)   Acute on chronic renal failure (HCC)   Acute on chronic heart failure with preserved ejection fraction (HFpEF) (HCC)   Plan: #Probable sepsis secondary to presumed UTI Patient meets 2 SIRS criteria, leukocytosis and hypotension. Blood cultures and urine cultures are pending, however, her UA was significant for small leukocyte esterase and many bacteria. Patient also had an episode of hypoglycemia in the 30s, which improved after an amp of D50 was given last night. Lactic acid elevated to 2.7.  - Blood and urine cultures pending - Vanc and cefepime started  - PT, PTT, fibrinogen, and blood smear pending  #GI Bleed #Acute on chronic anemia Patient has had ongoing bleeding could be secondary to her recent polypectomy and her anticoagulation. Eliquis was discontinued on admission in the setting of her bleeding. Hb 7.1 this morning after she received 2 units of blood yesterday. She is fatigued today, but denies any dizziness or lightheadedness. She was previously having episodes of hematochezia, but had a loose, large bowel movement this morning that was black and tarry per nursing, consistent with an upper GI bleed.  - 1 unit PRBC to be given - Post transfusion H&H - Increased Protonix to '80mg'$  BID -  SCDs for VTE prophylaxis due to bleeding risk - Consulted GI, appreciate recs  #Acute on chronic heart failure with preserved ejection fraction Patient continues to be hypervolemic on exam today, with jugular venous distention and bilateral lower extremity edema noted. Will hold diuresis today given the patient's hypotension. - Repeat BMP  #Myeloproliferative neoplasm #Leukocytosis White blood cell count is usually around 100, however, 162 today.  Ceftriaxone was initially started, as UTI was suspected, however, empiric vanc and cefepime were started as noted above. - Blood cultures pending  #Acute kidney injury on chronic kidney disease IIIb AKI could be secondary to acute on chronic anemia, as above, or could be secondary to her volume overload. Patient received 1 dose of IV Lasix '80mg'$  and her renal function slightly improved, with Cr 3.19. Renal ultrasound ruled out any obstruction or hydronephrosis. - Urinary cath if patient is retaining urine >250cc - Continue to monitor renal function  #Electrolyte abnormalities Patient was hyperkalemic again this morning, to 5.3. Another dose of lokelma was ordered. Phosphorus also remains elevated at 5.9, uric acid yesterday elevated to 9.9.   - Continue to monitor electrolytes  Best Practice: Diet: NPO IVF: none VTE: SCDs Start: 03/16/21 0445 Code: Full AB: vanc and cefepime  Therapy Recs: Pending Family Contact: Gerald Stabs and [name] (sons), to be notified. DISPO: Anticipated discharge to Home pending Medical stability.  Signature: Buddy Duty, D.O.  Internal Medicine Resident, PGY-1 [facility]  Pager: [phone] 6:14 AM, 03/17/2021   Please contact the on call pager after 5 pm and on weekends at [phone].

## 2021-03-17 NOTE — H&P (View-Only) (Signed)
Attending physician's note   I have taken an interval history, reviewed the chart and examined the patient. I agree with the Advanced Practitioner's note, impression, and recommendations as outlined.   80 year old female with medical history as outlined below, to include history of atrial flutter (on Eliquis), CHF, HTN, aortic stenosis, myelodysplastic syndrome, admitted with weakness and dizziness in the setting of melena for the last 3 days or so.  She states she has been having dark stools intermittently for the last month or so.  No nausea/vomiting.  Did have colonoscopy on 01/27/2021 with removal of 3 subcentimeter tubular adenomas.  No subsequent hematochezia or abdominal pain.  Admission labs on 8/10 notable for the following: - WBC 172 (myelodysplastic syndrome) - H/H 6.3/19.2 with MCV/RDW 96/20 (baseline Hgb ~7.5-8) - BUN/creatinine 98/3.35 (baseline creatinine ~1.4-1.6) - INR 2.9  Has been transfused 2 units PRBCs with repeat Hgb 7.1 this morning.  Transfusing third unit today.  1) Acute blood loss anemia superimposed on anemia of chronic disease 2) Melena 3) History of atrial flutter 4) Chronic anticoagulation 5) Morbid Obesity 6) Aortic stenosis 7) Supratherapeutic INR (2.9 on admission)  - Holding Eliquis since 8/10 - Agree with changing to high-dose IV Protonix - Posttransfusion CBC - Continue serial H/H checks with additional blood products as needed per protocol - INR 2.0 today.  Ideally <1.7 to facilitate therapeutic endoscopic management - N.p.o. at midnight for EGD tomorrow - EGD tomorrow for diagnostic and therapeutic intent - Discussed elevated periprocedural risks due to underlying comorbidities  8) AKI on CKD3 9) UTI - Management per primary Medicine service  10) Myelodysplastic syndrome  The indications, risks, and benefits of EGD were explained to the patient and her family member at bedside in detail. Risks include but are not limited to  bleeding, perforation, adverse reaction to medications, and cardiopulmonary compromise. Sequelae include but are not limited to the possibility of surgery, hospitalization, and mortality. The patient verbalized understanding and wished to proceed.     [address], DO, FACG [phone] office        Consultation  Referring Provider: Internal medicine service/[facility] Primary Care Physician:  [name], MD Primary Gastroenterologist:  Lemmie Evens  Reason for Consultation: Dark stool, anemia, heme positive  HPI: Erika [name] is a 80 y.o. female who with multiple comorbidities, on chronic Eliquis for history of atrial flutter, compensated congestive heart failure, hypertension, aortic stenosis and history of myelodysplastic syndrome who was admitted last evening after she presented to the emergency room with complaints of progressive weakness over the past 3 days and some dizziness.  She also reported dark stool over the past 3 days. Reported last dose of Eliquis a.m. 03/22/2021.   She had undergone colonoscopy at the [facility] on 01/27/2021 per Dr. Tarri Glenn for follow-up of adenomatous colon polyps she was found to have 3 small polyps the largest 4 mm all were removed and consistent with tubular adenomas.  No prior EGD in our system.  Reviewing her labs, March 2022 hemoglobin 8.3/May 2022 hemoglobin 7.5 Yesterday hemoglobin 7.3 hematocrit 22.7 WBC 158,000, platelets 129 Potassium 5.7/BUN 104/creatinine 3.3 Pro time/INR pending Lactate 2.7 2D echo yesterday EF 60 to 65%, moderate aortic valve stenosis  Patient did have a large tarry bowel movement this morning per nurse.  Patient has no complaints of abdominal pain, has not had any recent nausea or vomiting, no dysphagia or odynophagia.  She says her stools have been somewhat looser recently.  She has not noted any bright  red blood per rectum.  She says she has been feeling quite weak over the past couple of days and somewhat sleepy.   No dysuria.  UA positive greater than 50 WBCs many bacteria-culture pending-prelim greater than 100,000 colonies E. Coli- On IV antibiotics.  She was transfused 2 units of packed RBCs last p.m.-and hemoglobin 7.1 this a.m.  She has had some mild hypotension this morning, current BP 98/50, pulse 80s   Past Medical History:  Diagnosis Date   Anemia    with menses   Asthma    Atrial flutter, paroxysmal (HCC)    Back pain    status post surgery 2002   Blood transfusion without reported diagnosis    Breast cyst    Excesion with FNA, begnin in 2004.    CHF (congestive heart failure) (HCC)    Degenerative joint disease of spine    Imaging 2005,  Degenerative hypertrophic facet arthritis changes L4-5 and L5-S1.[name]    History of shingles    Recurrent with post herpetic neuralgia.    Hypertension    Lymphadenopathy    Of the mediastinum, Right side CXR 2008, not read on 2010 cxr.    Menopause    Obesity    BMI 54   Psychosis ([place])    Secondary to prednisone.   Shingles    Stasis dermatitis    W/ LE edema, prviously on lasix now on mazxide.    SVT (supraventricular tachycardia) ([place]) 01/2008   one run while hospitalized   Tuberculosis    active TB treated in 2002, hx of paraspinal lumbar TB,     Past Surgical History:  Procedure Laterality Date   BREAST CYST ASPIRATION Left    Breast cyst biopsy     CHOLECYSTECTOMY  02/02/2012   Procedure: LAPAROSCOPIC CHOLECYSTECTOMY;  Surgeon: Zenovia Jarred, MD;  Location: [facility];  Service: General;  Laterality: N/A;   Hemilaminectomy of L4, L5 and S1 decompression of tumor in epidural space  2000   IR FLUORO GUIDED NEEDLE PLC ASPIRATION/INJECTION LOC  07/17/2019    Prior to Admission medications   Medication Sig Start Date End Date Taking? Authorizing Provider  ADVAIR DISKUS 500-50 MCG/DOSE AEPB Inhale 1 puff into the lungs 2 (two) times daily. 10/07/20  Yes [name], MD  albuterol (VENTOLIN HFA) 108 (90 Base) MCG/ACT inhaler  INHALE 1-2 PUFFS INTO THE LUNGS EVERY 6 HOURS AS NEEDED FOR WHEEZING OR FOR SHORTNESS OF BREATH 09/01/20  Yes [name], MD  allopurinol (ZYLOPRIM) 300 MG tablet Take 0.5 tablets (150 mg total) by mouth 2 (two) times daily. 12/07/20  Yes [name], MD  amLODipine (NORVASC) 10 MG tablet Take 0.5 tablets (5 mg total) by mouth daily. 10/07/20  Yes [name], MD  apixaban (ELIQUIS) 5 MG TABS tablet Take 1 tablet (5 mg total) by mouth 2 (two) times daily. 10/07/20  Yes [name], MD  aspirin EC 81 MG tablet Take 1 tablet (81 mg total) by mouth daily. 11/03/20  Yes [name], MD  Ensure Max Protein (ENSURE MAX PROTEIN) LIQD Take 330 mLs (11 oz total) by mouth 2 (two) times daily. Patient taking differently: Take 11 oz by mouth 2 (two) times daily. She drinks regular Ensure not Ensure Max and she prefers chocolate 08/04/19  Yes Danford, Suann Larry, MD  fluticasone (FLONASE) 50 MCG/ACT nasal spray SHAKE LIQUID AND INSTILL ONE (1) SPRAY IN EACH NOSTRIL ONCE DAILY Patient taking differently: Place 1 spray into both nostrils  daily as needed for allergies. 01/30/21  Yes [name], MD  furosemide (LASIX) 40 MG tablet Take 1 tablet (40 mg total) by mouth daily. 01/26/21 04/26/21 Yes Rehman, Areeg N, DO  metoprolol tartrate (LOPRESSOR) 50 MG tablet TAKE 1 TABLET BY MOUTH TWICE DAILY 09/01/20  Yes [name], MD  [facility] powder APPLY TOPICALLY TO AFFECTED AREAS TWICE DAILY Patient taking differently: Apply 1 application topically 2 (two) times daily. 09/01/20  Yes [name], MD  omeprazole (PRILOSEC) 20 MG capsule TAKE 1 CAPSULE BY MOUTH DAILY 01/30/21  Yes [name], MD  potassium chloride SA (KLOR-CON) 20 MEQ tablet Take 0.5 tablets (10 mEq total) by mouth daily. 01/26/21  Yes Rehman, Areeg N, DO  pravastatin (PRAVACHOL) 20 MG tablet Take 1 tablet (20 mg total) by mouth daily. 10/07/20  Yes [name], MD  sodium chloride (OCEAN) 0.65 % SOLN nasal spray Place 1 spray into  both nostrils as needed for congestion. 09/29/19  Yes [name], MD    Current Facility-Administered Medications  Medication Dose Route Frequency Provider Last Rate Last Admin   0.9 %  sodium chloride infusion (Manually program via Guardrails IV Fluids)   Intravenous Once Atway, Rayann N, DO       albuterol (PROVENTIL) (2.5 MG/3ML) 0.083% nebulizer solution 2.5 mg  2.5 mg Inhalation Q6H PRN Iona Beard, MD   2.5 mg at 03/16/21 [id number]   allopurinol (ZYLOPRIM) tablet 150 mg  150 mg Oral BID Iona Beard, MD   150 mg at 03/17/21 1218   ceFEPIme (MAXIPIME) 2 g in sodium chloride 0.9 % 100 mL IVPB  2 g Intravenous Q24H Bridgett Larsson, Lydia D, RPH       fluticasone furoate-vilanterol (BREO ELLIPTA) 200-25 MCG/INH 1 puff  1 puff Inhalation Daily Iona Beard, MD       pantoprazole (PROTONIX) injection 40 mg  40 mg Intravenous Q12H Iona Beard, MD   40 mg at 03/17/21 0855   vancomycin (VANCOREADY) IVPB 1250 mg/250 mL  1,250 mg Intravenous Once Donnamae Jude, RPH 166.7 mL/hr at 03/17/21 1217 1,250 mg at 03/17/21 1217   [START ON 04/05/2021] vancomycin (VANCOREADY) IVPB 500 mg/100 mL  500 mg Intravenous Q24H Donnamae Jude, [facility]        Allergies as of 03/11/2021 - Review Complete 03/10/2021  Allergen Reaction Noted   Prednisone Other (See Comments) 02/26/2008   Seroquel [quetiapine fumarate] Nausea Only and Other (See Comments) 12/30/2019    Family History  Problem Relation Age of Onset   Stroke Mother        at young age   Heart disease Mother    Heart attack Father    Heart disease Father    Cancer Sister        Unknown   Stroke Brother    Stroke Brother    Sickle cell anemia Brother    Diabetes Brother    Stroke Brother    Stroke Son    Breast cancer Other    Colon cancer Neg Hx    Esophageal cancer Neg Hx    Pancreatic cancer Neg Hx    Stomach cancer Neg Hx    Liver disease Neg Hx    Rectal cancer Neg Hx     Social History   Socioeconomic History   Marital status: Widowed     Spouse name: Not on file   Number of children: Not on file   Years of education: 11 th grade   Highest education  level: Not on file  Occupational History   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 There are not steps up to the entrance the patient uses. One son stays with her once in a while.      Patient's method of transportation is personal car.      The highest level of education was some high school: 11th grade.      The patient currently retired.      Identified important Relationships are "My friend Mart Piggs, my nieces, my grandkids, a lot of people.       Pets : None       Interests / Fun: "Playing word games on my phone, watch TV, Talking on phone."       Current Stressors: "Nothing, really. I try not to let anything stress me."       Religious / Personal Beliefs: "I believe in God."       L. Ducatte, BSN, RN-BC       Social Determinants of Health   Financial Resource Strain: Not on file  Food Insecurity: Not on file  Transportation Needs: Not on file  Physical Activity: Not on file  Stress: Not on file  Social Connections: Not on file  Intimate Partner Violence: Not on file    Review of Systems: Pertinent positive and negative review of systems were noted in the above HPI section.  All other review of systems was otherwise negative.   Physical Exam: Vital signs in last 24 hours: Temp:  [97.5 F (36.4 C)-98.1 F (36.7 C)] 97.5 F (36.4 C) (08/12  1146) Pulse Rate:  [62-78] 78 (08/12 1146) Resp:  [13-19] 15 (08/12 0825) BP: (88-114)/(40-76) 88/51 (08/12 0825) SpO2:  [91 %-100 %] 100 % (08/12 1146) Weight:  [131.7 kg] 131.7 kg (08/12 0135) Last BM Date: 03/16/21 General:   Alert,  Well-developed, ill-appearing older African-American female pleasant and cooperative in NAD Head:  Normocephalic and atraumatic. Eyes:  Sclera clear, no icterus.   Conjunctiva pale. Ears:  Normal auditory acuity. Nose:  No deformity, discharge,  or lesions. Mouth:  No deformity or lesions.   Neck:  Supple; no masses or thyromegaly. Lungs: Scattered basilar rales and rhonchi  heart:  Regular rate and rhythm; soft murmur, clicks, rubs,  or gallops. Abdomen:  Soft,nontender, BS active,nonpalp mass or hsm.   Rectal: Documented melena Msk:  Symmetrical without gross deformities. . Pulses:  Normal pulses noted. Extremities:  Without clubbing or edema. Neurologic:  Alert and  oriented x4;  grossly normal neurologically. Skin:  Intact without significant lesions or rashes.. Psych:  Alert and cooperative. Normal mood and affect.  Intake/Output from previous day: 08/11 0701 - 08/12 0700 In: 540 [P.O.:125; Blood:315; IV Piggyback:100] Out: 500 [Urine:500] Intake/Output this shift: Total I/O In: 480 [P.O.:480] Out: -   Lab Results: Recent Labs    03/16/21 1119 03/16/21 1903 03/17/21 0217  WBC 158.0* 162.4* 162.1*  HGB  7.3* 7.2* 7.1*  HCT 22.7* 22.9* 22.4*  PLT 129* 130* 129*   BMET Recent Labs    03/16/21 1119 03/16/21 1903 03/17/21 0217  NA 139 138 138  K 5.0 5.7* 5.3*  CL 106 105 107  CO2 20* 19* 17*  GLUCOSE 42* 35* 39*  BUN 100* 104* 102*  CREATININE 3.32* 3.34* 3.19*  CALCIUM 8.8* 8.8* 8.9   LFT Recent Labs    03/17/21 0838  PROT 6.3*  ALBUMIN 3.1*  AST 26  ALT 18  ALKPHOS 342*  BILITOT 0.7  BILIDIR 0.5*  IBILI 0.2*   PT/INR Recent Labs    03/09/2021 2233  LABPROT 30.3*  INR 2.9*       IMPRESSION:  #80  80 year old African-American female admitted with 3 to 4-day history of weakness Patient noted dark stools over the past 3 days in setting of Eliquis No prior EGD, she did have colonoscopy June 2022 for follow-up of adenomatous polyps and had 3 tubular adenomas removed. Current blood loss consistent with upper GI bleeding, rule out gastropathy, peptic ulcer disease, esophagitis, AVMs.  Off Eliquis since 03/27/2021-however INR 2.9 on admit  #2 acute on chronic anemia with baseline hemoglobin 7.5-8.5 range #3 myelodysplastic syndrome #4 acute urinary tract infection with SIRS Chronic leukocytosis but current counts significantly higher than her baseline, elevated lactate. #5 acute kidney injury likely secondary to #4, in setting of chronic kidney disease stage III #6 history of atrial flutter-on chronic Eliquis #7.  Compensated congestive heart failure #8.  Moderate aortic stenosis  PLAN: Will change to Protonix infusion Serial hemoglobins, and transfuse to keep hemoglobin 7.5 Patient has eaten today, will make her n.p.o. after midnight and plan for EGD in a.m. tomorrow with Dr. Bryan Lemma, as long as otherwise hemodynamically stable Continue to hold Eliquis  GI will follow with you.   Amy Esterwood PA-C 03/17/2021, 1:01 PM

## 2021-03-17 NOTE — Hospital Course (Addendum)
Erika [name] is a 80 y.o. female with past medical history of atrial flutter, CHF, HTN, paraspinal lumbar TB, myeloproliferative disorder who presented with complaint of bloody stool and admitted for acute on chronic anemia.   #Sepsis secondary to E. Coli UTI Patient meets 2 SIRS criteria, leukocytosis and hypotension. Blood cultures and urine cultures are pending, however, her UA was significant for small leukocyte esterase and many bacteria. Patient also had an episode of hypoglycemia in the 30s, which improved after an amp of D50 was given last night. Lactic acid elevated to 2.7. Patient received IV rocephin 1g/day.   #Acute on chronic anemia Patient states that since her screening colonoscopy with polypectomy at the end of June, she has experienced hematochezia, with about 1 episode per day. Ongoing bleeding could be secondary to the recent polypectomy and her anticoagulation. Eliquis was discontinued on admission in the setting of her bleeding. Hb on admission was 6.3, down from 7.1 about 2 weeks ago. Patient was transfused with 2 units of RBCs. Hb 7.1 on the morning of 8/12 and the patient was given another unit PRBC. EGD performed on 8/13 and noted mild, non-ulcerated gastritis. Patient continued to have melanotic stools though, and had video capsule endoscopy on 8/15 with results showing ***  #Acute on chronic heart failure with preserved ejection fraction Last echo >18 months ago. Patient is hypervolemic on exam today, with jugular venous distention and bilateral lower extremity edema noted. Repeat echo showed EF of 60-65% and no regional wall abnormalities noted. Right atrial size is massively dilated. Diuresed patient over the course of her admission with '80mg'$  IV lasix BID. As of 8/16, patient is down ~4kg in weight. She continued to deny any chest pain, palpitations, or shortness of breath.  #Myeloproliferative neoplasm #Leukocytosis Patient is primarily managed by Dr. Irene Limbo for her  myeloproliferative neoplasm, for which she states that she was diagnosed with a few years ago. It has manifested as a neutrophil predominant leukocytosis. White blood cell count is usually around 100, however, it is elevated to 173 on admission. No signs of infection are noted. UA positive for pyuria and bacteriuria, with urine culture still pending. Blood cultures also were ordered to rule out invasive infection, as this patient is immunocompromised. Grew staph epidermidis, likely a contaminant.   #Acute kidney injury on chronic kidney disease IIIb AKI could be secondary to acute on chronic anemia, as above, or could be secondary to her volume overload. Patient received 1 dose of IV Lasix '80mg'$  and will have her renal function monitored. She notes that she has had a decrease in urine output over the last couple of months, as well. Urine output increased over the course of her admission and kidney function improved with IV diuresis.   #Electrolyte abnormalities Patient was hyperkalemic on presentation, with K of 5.8. She received calcium gluconate and lokelma, and repeat K was 5. Phosphorus also elevated at 5.9, uric acid elevated to 9.9. Tumor lysis syndrome is less likely given the fact that the patient has not received any cytotoxic therapies recently and the syndrome is more common with Burkitts lymphoma, AML, ALL, CML, and ALL.

## 2021-03-17 NOTE — Progress Notes (Signed)
PHARMACY - PHYSICIAN COMMUNICATION CRITICAL VALUE ALERT - BLOOD CULTURE IDENTIFICATION (BCID)  Erika [name] is an 80 y.o. female who presented to [facility] on 03/16/2021 with a chief complaint of GI bleed and heart failure exacerbation.   Assessment:  Patient with GPC/staph epi growing in 1/4 bottles, likely contaminant. Physician team notified, no changes at this time.   Name of physician (or Provider) Contacted:  Dr. Marva Panda  Current antibiotics: vancomycin/cefepime  Changes to prescribed antibiotics recommended:  Patient is on recommended antibiotics - No changes needed  No results found for this or any previous visit.  Erin Hearing PharmD., BCPS Clinical Pharmacist 03/17/2021 4:08 PM

## 2021-03-18 ENCOUNTER — Inpatient Hospital Stay (HOSPITAL_COMMUNITY): Payer: PPO | Admitting: Anesthesiology

## 2021-03-18 ENCOUNTER — Encounter (HOSPITAL_COMMUNITY): Payer: Self-pay | Admitting: Student in an Organized Health Care Education/Training Program

## 2021-03-18 ENCOUNTER — Encounter (HOSPITAL_COMMUNITY)
Admission: EM | Disposition: E | Payer: Self-pay | Source: Home / Self Care | Attending: Student in an Organized Health Care Education/Training Program

## 2021-03-18 DIAGNOSIS — K2970 Gastritis, unspecified, without bleeding: Secondary | ICD-10-CM

## 2021-03-18 DIAGNOSIS — K921 Melena: Secondary | ICD-10-CM | POA: Diagnosis not present

## 2021-03-18 HISTORY — PX: BIOPSY: SHX5522

## 2021-03-18 HISTORY — PX: ESOPHAGOGASTRODUODENOSCOPY (EGD) WITH PROPOFOL: SHX5813

## 2021-03-18 LAB — GLUCOSE, CAPILLARY
Glucose-Capillary: 81 mg/dL (ref 70–99)
Glucose-Capillary: 87 mg/dL (ref 70–99)
Glucose-Capillary: 86 mg/dL (ref 70–99)
Glucose-Capillary: 85 mg/dL (ref 70–99)
Glucose-Capillary: 95 mg/dL (ref 70–99)
Glucose-Capillary: 103 mg/dL — ABNORMAL HIGH (ref 70–99)
Glucose-Capillary: 100 mg/dL — ABNORMAL HIGH (ref 70–99)

## 2021-03-18 LAB — CBC
HCT: 25.4 % — ABNORMAL LOW (ref 36.0–46.0)
Hemoglobin: 7.7 g/dL — ABNORMAL LOW (ref 12.0–15.0)
MCH: 30.6 pg (ref 26.0–34.0)
MCHC: 30.3 g/dL (ref 30.0–36.0)
MCV: 100.8 fL — ABNORMAL HIGH (ref 80.0–100.0)
Platelets: 119 10*3/uL — ABNORMAL LOW (ref 150–400)
RBC: 2.52 MIL/uL — ABNORMAL LOW (ref 3.87–5.11)
RDW: 18.6 % — ABNORMAL HIGH (ref 11.5–15.5)
WBC: 178.8 10*3/uL (ref 4.0–10.5)
nRBC: 0 % (ref 0.0–0.2)

## 2021-03-18 LAB — BPAM RBC
Blood Product Expiration Date: 202209102359
Blood Product Expiration Date: 202209102359
Blood Product Expiration Date: 202209142359
ISSUE DATE / TIME: 202208110429
ISSUE DATE / TIME: 202208110646
ISSUE DATE / TIME: 202208121619
Unit Type and Rh: 5100
Unit Type and Rh: 5100
Unit Type and Rh: 5100

## 2021-03-18 LAB — CULTURE, BLOOD (ROUTINE X 2): Special Requests: ADEQUATE

## 2021-03-18 LAB — TYPE AND SCREEN
ABO/RH(D): O POS
Antibody Screen: NEGATIVE
Unit division: 0
Unit division: 0
Unit division: 0

## 2021-03-18 LAB — PROTIME-INR
INR: 1.8 — ABNORMAL HIGH (ref 0.8–1.2)
Prothrombin Time: 20.5 seconds — ABNORMAL HIGH (ref 11.4–15.2)

## 2021-03-18 LAB — BASIC METABOLIC PANEL
Anion gap: 14 (ref 5–15)
BUN: 99 mg/dL — ABNORMAL HIGH (ref 8–23)
CO2: 17 mmol/L — ABNORMAL LOW (ref 22–32)
Calcium: 8.8 mg/dL — ABNORMAL LOW (ref 8.9–10.3)
Chloride: 106 mmol/L (ref 98–111)
Creatinine, Ser: 3 mg/dL — ABNORMAL HIGH (ref 0.44–1.00)
GFR, Estimated: 15 mL/min — ABNORMAL LOW (ref >60)
Glucose, Bld: 39 mg/dL — CL (ref 70–99)
Potassium: 4.3 mmol/L (ref 3.5–5.1)
Sodium: 137 mmol/L (ref 135–145)

## 2021-03-18 LAB — URINE CULTURE: Culture: >=100000 — AB

## 2021-03-18 SURGERY — ESOPHAGOGASTRODUODENOSCOPY (EGD) WITH PROPOFOL
Anesthesia: Monitor Anesthesia Care

## 2021-03-18 MED ORDER — PHENYLEPHRINE 40 MCG/ML (10ML) SYRINGE FOR IV PUSH (FOR BLOOD PRESSURE SUPPORT)
PREFILLED_SYRINGE | INTRAVENOUS | Status: DC | PRN
  Administered 2021-03-18 (×2): 80 ug via INTRAVENOUS

## 2021-03-18 MED ORDER — PANTOPRAZOLE SODIUM 40 MG PO TBEC
40.0000 mg | DELAYED_RELEASE_TABLET | Freq: Every day | ORAL | Status: DC
  Administered 2021-03-18 – 2021-03-22 (×5): 40 mg via ORAL
  Filled 2021-03-18 (×3): qty 1
  Filled 2021-03-18: qty 2

## 2021-03-18 MED ORDER — SODIUM CHLORIDE 0.9 % IV SOLN: INTRAVENOUS | Status: DC | PRN

## 2021-03-18 MED ORDER — DEXTROSE 50 % IV SOLN
INTRAVENOUS | Status: AC
  Administered 2021-03-18: 50 mL
  Filled 2021-03-18: qty 50

## 2021-03-18 MED ORDER — PHENYLEPHRINE 40 MCG/ML (10ML) SYRINGE FOR IV PUSH (FOR BLOOD PRESSURE SUPPORT): PREFILLED_SYRINGE | INTRAVENOUS | Status: DC | PRN

## 2021-03-18 MED ORDER — PROPOFOL 500 MG/50ML IV EMUL
INTRAVENOUS | Status: DC | PRN
  Administered 2021-03-18: 100 ug/kg/min via INTRAVENOUS

## 2021-03-18 MED ORDER — PROPOFOL 10 MG/ML IV BOLUS
INTRAVENOUS | Status: DC | PRN
  Administered 2021-03-18: 20 mg via INTRAVENOUS

## 2021-03-18 MED ORDER — ALBUMIN HUMAN 5 % IV SOLN
INTRAVENOUS | Status: DC | PRN
Start: 2021-03-18 — End: 2021-03-18

## 2021-03-18 MED ORDER — FUROSEMIDE 10 MG/ML IJ SOLN
40.0000 mg | Freq: Once | INTRAMUSCULAR | Status: AC
  Administered 2021-03-18: 40 mg via INTRAVENOUS
  Filled 2021-03-18: qty 4

## 2021-03-18 MED ORDER — EPHEDRINE SULFATE 50 MG/ML IJ SOLN
INTRAMUSCULAR | Status: DC | PRN
  Administered 2021-03-18: 10 mg via INTRAVENOUS

## 2021-03-18 MED ORDER — LIDOCAINE 2% (20 MG/ML) 5 ML SYRINGE
INTRAMUSCULAR | Status: DC | PRN
  Administered 2021-03-18: 40 mg via INTRAVENOUS

## 2021-03-18 MED ORDER — SODIUM CHLORIDE 0.9 % IV SOLN
1.0000 g | INTRAVENOUS | Status: AC
  Administered 2021-03-19 – 2021-03-21 (×3): 1 g via INTRAVENOUS
  Filled 2021-03-18 (×2): qty 10

## 2021-03-18 MED ORDER — ACETAMINOPHEN 325 MG PO TABS
650.0000 mg | ORAL_TABLET | Freq: Four times a day (QID) | ORAL | Status: DC | PRN
  Administered 2021-03-18 – 2021-03-21 (×4): 650 mg via ORAL
  Filled 2021-03-18 (×4): qty 2

## 2021-03-18 SURGICAL SUPPLY — 15 items

## 2021-03-18 NOTE — Anesthesia Preprocedure Evaluation (Addendum)
Anesthesia Evaluation  Patient identified by MRN, date of birth, ID band Patient awake    Reviewed: Allergy & Precautions, NPO status , Patient's Chart, lab work & pertinent test results  History of Anesthesia Complications Negative for: history of anesthetic complications  Airway Mallampati: III  TM Distance: >3 FB Neck ROM: Full    Dental  (+) Dental Advisory Given, Partial Upper, Chipped, Poor Dentition   Pulmonary asthma ,    Pulmonary exam normal        Cardiovascular hypertension, Pt. on medications and Pt. on home beta blockers pulmonary hypertensionNormal cardiovascular exam+ dysrhythmias Atrial Fibrillation and Supra Ventricular Tachycardia + Valvular Problems/Murmurs AS and MR    '22 TTE - EF 60 to 65%. There is the interventricular  septum is flattened in systole and diastole, consistent with right ventricular pressure and volume overload. The right ventricular size is mildly enlarged. There is moderately elevated pulmonary artery systolic pressure. The estimated right ventricular systolic pressure is  36.1 mmHg. Left atrial size was moderately dilated. Right atrial size was massively dilated. Mild to moderate mitral valve regurgitation. Tricuspid valve regurgitation is severe . Aortic valve regurgitation is trivial. Moderate aortic valve stenosis. Aortic regurgitation PHT measures 488 msec.  Aortic valve area, by VTI measures 1.26 cm. Aortic valve mean gradient measures 16.0 mmHg. Aortic valve Vmax measures 2.84 m/s. DI is 0.44.     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)     Abdominal   Peds  Hematology  (+) anemia ,  On eliquis Plt 120k WBC > 160k MDS    Anesthesia Other Findings    Reproductive/Obstetrics                            Anesthesia Physical Anesthesia Plan  ASA: 3  Anesthesia Plan: MAC   Post-op Pain Management:    Induction:   PONV Risk Score and Plan: 2 and Propofol infusion and Treatment may vary due to age or medical condition  Airway Management Planned: Nasal Cannula and Natural Airway  Additional Equipment: None  Intra-op Plan:   Post-operative Plan:   Informed Consent: I have reviewed the patients History and Physical, chart, labs and discussed the procedure including the risks, benefits and alternatives for the proposed anesthesia with the patient or authorized representative who has indicated his/her understanding and acceptance.       Plan Discussed with: CRNA and Anesthesiologist  Anesthesia Plan Comments:        Anesthesia Quick Evaluation

## 2021-03-18 NOTE — Anesthesia Postprocedure Evaluation (Signed)
Anesthesia Post Note  Patient: Elmore  Procedure(s) Performed: ESOPHAGOGASTRODUODENOSCOPY (EGD) WITH PROPOFOL BIOPSY     Patient location during evaluation: PACU Anesthesia Type: MAC Level of consciousness: awake and alert Pain management: pain level controlled Vital Signs Assessment: post-procedure vital signs reviewed and stable Respiratory status: spontaneous breathing, nonlabored ventilation and respiratory function stable Cardiovascular status: stable and blood pressure returned to baseline Anesthetic complications: no   No notable events documented.  Last Vitals:  Vitals:   03/13/2021 1230 03/25/2021 1241  BP: (!) 119/53 107/62  Pulse: 86 85  Resp: (!) 22 16  Temp:  (!) 36.3 C  SpO2: 94% 100%    Last Pain:  Vitals:   03/10/2021 1241  TempSrc: Oral  PainSc:                  Audry Pili

## 2021-03-18 NOTE — Interval H&P Note (Signed)
History and Physical Interval Note:  03/07/2021 11:08 AM  Erika [name]  has presented today for surgery, with the diagnosis of GI bleed.  The various methods of treatment have been discussed with the patient and family. After consideration of risks, benefits and other options for treatment, the patient has consented to  Procedure(s): ESOPHAGOGASTRODUODENOSCOPY (EGD) WITH PROPOFOL (N/A) as a surgical intervention.  The patient's history has been reviewed, patient examined, no change in status, stable for surgery.  I have reviewed the patient's chart and labs.  Questions were answered to the patient's satisfaction.     [name]

## 2021-03-18 NOTE — Progress Notes (Addendum)
HD#2 Subjective:  Patient Summary: [name] is a 80 year old female with Pmhx of myeloproliferative neoplasm, atrial flutter on Eliquis, HFpEF admitted for acute on chronic normocytic anemia in setting of GI bleed complicated by acute on chronic HFpEF exacerbation, acute on chronic renal failure and urinary tract infection.   Overnight Events: No acute overnight events reported.    Interim History: [name] was evaluated at bedside this morning prior to her EGD. She appears to have some increased work of breathing however denies any shortness of breath at this time. She denies any abdominal pain and reports that she has not had any further bowel movements since yesterday. She endorses feeling hungry, discussed we can provide her food following her procedure.   Objective:  Vital signs in last 24 hours: Vitals:   03/17/21 1935 03/06/2021 0018 03/07/2021 0228 03/19/2021 0511  BP: (!) 103/51 (!) 103/51  (!) 96/47  Pulse:  78    Resp:  16  14  Temp:  (!) 97.5 F (36.4 C)  98.4 F (36.9 C)  TempSrc:  Oral  Oral  SpO2:      Weight:   131.4 kg   Height:       Supplemental O2: Nasal Cannula SpO2: 100 % O2 Flow Rate (L/min): 2 L/min   Physical Exam:  Constitutional: pleasant, chronically ill-appearing female laying in bed, no acute distress Cardiovascular: regular rate and rhythm, systolic murmur, no rubs or gallop noted; 2+ bilateral lower extremity edema to the knee with +JVD (13cm)  Pulmonary/Chest: Normal effort on 2L oxygen, diminished lung sounds at bilateral bases Abdominal: soft, non-tender, non-distended, normal bowel sounds  Neurological: alert & oriented x 3 Skin: warm and dry  Filed Weights   03/16/21 0431 03/17/21 0135 04/03/2021 0228  Weight: 124.7 kg 131.7 kg 131.4 kg    Intake/Output Summary (Last 24 hours) at 03/08/2021 0630 Last data filed at 03/10/2021 0229 Gross per 24 hour  Intake 1787.35 ml  Output 1400 ml  Net 387.35 ml   Net IO Since Admission:  427.35 mL [03/13/2021 0630]  Pertinent Labs: CBC Latest Ref Rng & Units 03/17/2021 03/17/2021 03/17/2021  WBC 4.0 - 10.5 K/uL - 169.6(HH) 162.1(HH)  Hemoglobin 12.0 - 15.0 g/dL 7.7(L) 7.1(L) 7.1(L)  Hematocrit 36.0 - 46.0 % 24.2(L) 21.6(L) 22.4(L)  Platelets 150 - 400 K/uL - 120(L) 129(L)    CMP Latest Ref Rng & Units 04/03/2021 03/17/2021 03/17/2021  Glucose 70 - 99 mg/dL 39(LL) 106(H) -  BUN 8 - 23 mg/dL 99(H) 103(H) -  Creatinine 0.44 - 1.00 mg/dL 3.00(H) 3.15(H) -  Sodium 135 - 145 mmol/L 137 138 -  Potassium 3.5 - 5.1 mmol/L 4.3 4.9 -  Chloride 98 - 111 mmol/L 106 108 -  CO2 22 - 32 mmol/L 17(L) 20(L) -  Calcium 8.9 - 10.3 mg/dL 8.8(L) 8.5(L) -  Total Protein 6.5 - 8.1 g/dL - - 6.3(L)  Total Bilirubin 0.3 - 1.2 mg/dL - - 0.7  Alkaline Phos 38 - 126 U/L - - 342(H)  AST 15 - 41 U/L - - 26  ALT 0 - 44 U/L - - 18    Imaging: DG CHEST PORT 1 VIEW  Result Date: 03/17/2021 CLINICAL DATA:  Shortness of breath for 3-4 days. EXAM: PORTABLE CHEST 1 VIEW COMPARISON:  Radiograph yesterday FINDINGS: Unchanged cardiomegaly. Stable prominence of the central pulmonary arteries. Stable mediastinal contours with aortic atherosclerosis. No pneumothorax or pleural effusion. No confluent airspace disease. No convincing pulmonary edema. Mild diffuse interstitial  opacities arch are stable, favor chronic. Suspected calcified granuloma at the right lung base. No acute osseous abnormalities are seen. IMPRESSION: Unchanged cardiomegaly. No acute findings or change from prior exam. Electronically Signed   By: Keith Rake M.D.   On: 03/17/2021 15:41    Assessment/Plan:   Principal Problem:   GI bleed Active Problems:   AF (paroxysmal atrial fibrillation) (HCC)   Myelodysplastic syndrome, unspecified (HCC)   Acute on chronic renal failure (HCC)   Acute on chronic heart failure with preserved ejection fraction (HFpEF) (HCC)   Chronic anticoagulation   Melena   Acute on chronic anemia   Patient  Summary: [name] is a 80 year old female with Pmhx of myeloproliferative neoplasm, atrial flutter on Eliquis, HFpEF admitted for acute on chronic normocytic anemia in setting of GI bleed complicated by acute on chronic HFpEF exacerbation, acute on chronic renal failure and urinary tract infection.   Acute on chronic macrocytic anemia  GI bleed  Patient has received 3u pRBC since admission. Hb 7.7 this morning. Patient has not had any further episodes of melena since yesterday. EGD this morning with mild nonbleeding nonulcerated gastritis; no active bleed noted. Concern whether she still has some effects of Eliquis given her CKD given mild oozing at biopsy sites.  - GI consulted, appreciate recommendations  - Continue to trend CBC's - Protonix '40mg'$  daily  - If recurrent bleeding or no improvement in Hb, consider repeat colonoscopy to r/o bleed +/- video capsule endoscopy   Acute on chronic HFpEF exacerbation Patient continues to be significantly hypervolemic on examination with 3+ pitting edema of bilateral lower extremities to knees and significant JVD with depressed bibasilar lung sounds. Patient did receive one dose of IV Lasix '40mg'$  yesterday with 1.4L UOP. In setting of possible GI bleed, held off on aggressive diuresis with soft BP's.  - Continue with gentle diuresis through today - Monitor volume status and trend BMP  Sepsis 2/2 E.coli UTI Patient noted to have >100K colonies of pan-sensitive E.coli on urine culture. Blood culture with 1/2 bottles Staph epidermidis, likely contaminant. Although does have persistent leukocytosis, overall hypotension is slightly improved this morning. Suspect her GI bleed could have been contributing to the hypotension as well. Will narrow antibiotic therapy at this time to Rocephin. - IV Rocephin 1g daily starting 8/14 - Trend CBC - Monitor vitals   AKI on CKDIIIb Patient with stable renal function at Cr 3.0 this morning in setting of acute on  chronic anemia and hypervolemia.  - Continue to monitor renal function - Avoid nephrotoxic agents as able   Myeloproliferative neoplasm Trend CBC's   Diet:  Full Liquid IVF: None,None VTE: SCDs Code: Full PT/OT recs: CIR, none.  Prior to Admission Living Arrangement: Home Anticipated Discharge Location: CIR Barriers to Discharge: Continued medical management  Dispo: Anticipated discharge to Rehab in 3 days pending clinical improvement .   Harvie Heck, MD Internal Medicine Resident PGY-3 Pager# [phone]  Please contact the on call pager after 5 pm and on weekends at [phone].

## 2021-03-18 NOTE — Transfer of Care (Signed)
Immediate Anesthesia Transfer of Care Note  Patient: Erika [name]  Procedure(s) Performed: ESOPHAGOGASTRODUODENOSCOPY (EGD) WITH PROPOFOL BIOPSY  Patient Location: Endoscopy Unit  Anesthesia Type:MAC  Level of Consciousness: sedated  Airway & Oxygen Therapy: Patient Spontanous Breathing and Patient connected to nasal cannula oxygen  Post-op Assessment: Report given to RN and Post -op Vital signs reviewed and stable  Post vital signs: Reviewed and stable  Last Vitals:  Vitals Value Taken Time  BP 95/46 03/21/2021 1211  Temp 36.3 C 04/01/2021 1203  Pulse 80 04/04/2021 1212  Resp 14 03/25/2021 1212  SpO2 94 % 03/08/2021 1212  Vitals shown include unvalidated device data.  Last Pain:  Vitals:   03/17/2021 1203  TempSrc: Temporal  PainSc: Asleep         Complications: No notable events documented.

## 2021-03-18 NOTE — Progress Notes (Signed)
Serum glucose 39, 1 amp D50 given. Patient asymptomatic

## 2021-03-18 NOTE — Progress Notes (Signed)
Physical Therapy Cancellation Note  Patient currently off unit for procedure. Will follow-up for comprehensive evaluation when available.   Ellouise Newer, PT, DPT

## 2021-03-18 NOTE — Op Note (Signed)
[facility] Patient Name: Erika [name] Procedure Date : 04/02/2021 MRN: [id number] Attending MD: Gerrit Heck , MD Date of Birth: 02/27/1941 CSN: [id number] Age: 80 Admit Type: Inpatient Procedure:                Upper GI endoscopy Indications:              Acute post hemorrhagic anemia, Melena                           80 yo female with multiple medical problems,                            presents with melena x3 days and symptomatic                            anemia. Treated with 3U pRBC transfusion, with H/H                            7.7/24 today. Colonoscopy in 01/2021 with 3                            subcentimeter tubular adenomas, otherwise normal                            appearing colon. Eliquis stopped on admission. INR                            1.8 today. Providers:                Gerrit Heck, MD, Laverda Sorenson, Technician,                            Clearnce Sorrel, CRNA, Jeanella Cara, RN Referring MD:              Medicines:                Monitored Anesthesia Care Complications:            No immediate complications. Estimated Blood Loss:     Estimated blood loss was minimal. Procedure:                Pre-Anesthesia Assessment:                           - Prior to the procedure, a History and Physical                            was performed, and patient medications and                            allergies were reviewed. The patient's tolerance of                            previous anesthesia was also reviewed. The risks                            and benefits of  the procedure and the sedation                            options and risks were discussed with the patient.                            All questions were answered, and informed consent                            was obtained. Prior Anticoagulants: The patient has                            taken Eliquis (apixaban), last dose was 2 days                            prior to procedure.  ASA Grade Assessment: III - A                            patient with severe systemic disease. After                            reviewing the risks and benefits, the patient was                            deemed in satisfactory condition to undergo the                            procedure.                           After obtaining informed consent, the endoscope was                            passed under direct vision. Throughout the                            procedure, the patient's blood pressure, pulse, and                            oxygen saturations were monitored continuously. The                            [id number] Olympus endoscope was introduced                            through the mouth, and advanced to the third part                            of duodenum. The upper GI endoscopy was                            accomplished without difficulty. The patient  tolerated the procedure well. Scope In: Scope Out: Findings:      The examined esophagus was normal.      Scattered mild inflammation characterized by erythema was found in the       gastric body and in the gastric antrum. Biopsies were taken with a cold       forceps for Helicobacter pylori testing. There was mild oozing at the       biopsy sites. Each site irrigated for water tamponade. Continued       observation with subsequent cessation of oozing at each biopsy site       without need for further endoscopic intervention. Estimated blood loss       was minimal.      The duodenal bulb, first portion of the duodenum, second portion of the       duodenum and third portion of the duodenum were normal. Impression:               - Normal esophagus.                           - Mild, non-bleeding, non-ulcerated gastritis.                            Biopsied.                           - Normal duodenal bulb, first portion of the                            duodenum, second portion of the  duodenum and third                            portion of the duodenum.                           - No active bleeding nor stigmata of bleeding noted                            on this study. No blood in the upper GI tract.                           - There was mild oozing at each of the biopsy                            sites, likely due to both incomplete clearance of                            Eliquis in the setting of kidney disease, along                            with INR 1.8. This oozing stopped after just a                            couple minutes of observation, without need for  further intervention. Recommendation:           - Return patient to hospital ward for ongoing care.                           - Advance diet as tolerated.                           - Continue present medications.                           - Await pathology results.                           - Continue serial CBC checks with additional blood                            products as needed per protocol.                           - If recurrence of bleeding or if Hgb/Hct not                            uptrending appropriately, can consider repeat                            colonoscopy to rule out a very delayed post                            polypectomy bleed (would be outside the typical 28                            day window) along with VCE for small bowel                            interrogation. Procedure Code(s):        --- Professional ---                           [phone], Esophagogastroduodenoscopy, flexible,                            transoral; with biopsy, single or multiple Diagnosis Code(s):        --- Professional ---                           K29.70, Gastritis, unspecified, without bleeding                           D62, Acute posthemorrhagic anemia                           K92.1, Melena (includes Hematochezia) CPT copyright 2019 [employer]. All  rights reserved. The codes documented in this report are preliminary and upon coder review may  be revised to meet current compliance requirements. Gerrit Heck, MD 03/10/2021 12:14:35 PM Number of Addenda: 0

## 2021-03-18 NOTE — Progress Notes (Signed)
   03/13/2021 1900  Assess: MEWS Score  Temp 97.6 F (36.4 C)  BP 114/67  Pulse Rate (!) 111  ECG Heart Rate (!) 111  Resp 15  SpO2 99 %  O2 Device Nasal Cannula  O2 Flow Rate (L/min) 2 L/min  Assess: MEWS Score  MEWS Temp 0  MEWS Systolic 0  MEWS Pulse 2  MEWS RR 0  MEWS LOC 0  MEWS Score 2  MEWS Score Color Yellow  Assess: if the MEWS score is Yellow or Red  Were vital signs taken at a resting state? Yes  Focused Assessment No change from prior assessment  Early Detection of Sepsis Score *See Row Information* Low  MEWS guidelines implemented *See Row Information* Yes  Document  Patient Outcome Stabilized after interventions  Progress note created (see row info) Yes

## 2021-03-18 NOTE — Progress Notes (Signed)
PHARMACY - PHYSICIAN COMMUNICATION CRITICAL VALUE ALERT - BLOOD CULTURE IDENTIFICATION (BCID)  Erika [name] is an 80 y.o. female who presented to [facility] on 03/16/2021 with a chief complaint of GI bleed and heart failure exacerbation.   Assessment:   1/2 blood cultures growing Gram positive rods, likely contaminant  Name of physician (or Provider) Contacted:  Dr. Lisabeth Devoid  Current antibiotics: Vancomycin/Cefepime  Changes to prescribed antibiotics recommended:  Patient is on recommended antibiotics - No changes needed  Results for orders placed or performed during the hospital encounter of 04/01/2021  Blood Culture ID Panel (Reflexed) (Collected: 03/16/2021  2:10 PM)  Result Value Ref Range   Enterococcus faecalis NOT DETECTED NOT DETECTED   Enterococcus Faecium NOT DETECTED NOT DETECTED   Listeria monocytogenes NOT DETECTED NOT DETECTED   Staphylococcus species DETECTED (A) NOT DETECTED   Staphylococcus aureus (BCID) NOT DETECTED NOT DETECTED   Staphylococcus epidermidis DETECTED (A) NOT DETECTED   Staphylococcus lugdunensis NOT DETECTED NOT DETECTED   Streptococcus species NOT DETECTED NOT DETECTED   Streptococcus agalactiae NOT DETECTED NOT DETECTED   Streptococcus pneumoniae NOT DETECTED NOT DETECTED   Streptococcus pyogenes NOT DETECTED NOT DETECTED   A.calcoaceticus-baumannii NOT DETECTED NOT DETECTED   Bacteroides fragilis NOT DETECTED NOT DETECTED   Enterobacterales NOT DETECTED NOT DETECTED   Enterobacter cloacae complex NOT DETECTED NOT DETECTED   Escherichia coli NOT DETECTED NOT DETECTED   Klebsiella aerogenes NOT DETECTED NOT DETECTED   Klebsiella oxytoca NOT DETECTED NOT DETECTED   Klebsiella pneumoniae NOT DETECTED NOT DETECTED   Proteus species NOT DETECTED NOT DETECTED   Salmonella species NOT DETECTED NOT DETECTED   Serratia marcescens NOT DETECTED NOT DETECTED   Haemophilus influenzae NOT DETECTED NOT DETECTED   Neisseria meningitidis NOT DETECTED NOT  DETECTED   Pseudomonas aeruginosa NOT DETECTED NOT DETECTED   Stenotrophomonas maltophilia NOT DETECTED NOT DETECTED   Candida albicans NOT DETECTED NOT DETECTED   Candida auris NOT DETECTED NOT DETECTED   Candida glabrata NOT DETECTED NOT DETECTED   Candida krusei NOT DETECTED NOT DETECTED   Candida parapsilosis NOT DETECTED NOT DETECTED   Candida tropicalis NOT DETECTED NOT DETECTED   Cryptococcus neoformans/gattii NOT DETECTED NOT DETECTED   Methicillin resistance mecA/C DETECTED (A) NOT DETECTED    Erin Hearing PharmD., BCPS Clinical Pharmacist 03/25/2021 12:28 AM

## 2021-03-18 NOTE — Progress Notes (Signed)
   03/17/2021 2306  Assess: MEWS Score  BP (!) 105/54  Pulse Rate (!) 120  Resp 18  SpO2 99 %  O2 Device Nasal Cannula  O2 Flow Rate (L/min) 2 L/min  Assess: MEWS Score  MEWS Temp 0  MEWS Systolic 0  MEWS Pulse 2  MEWS RR 0  MEWS LOC 0  MEWS Score 2  MEWS Score Color Yellow  Assess: if the MEWS score is Yellow or Red  Were vital signs taken at a resting state? Yes  Focused Assessment No change from prior assessment  Early Detection of Sepsis Score *See Row Information* Low  MEWS guidelines implemented *See Row Information* Yes  Document  Patient Outcome Stabilized after interventions  Progress note created (see row info) Yes

## 2021-03-18 NOTE — Evaluation (Signed)
Physical Therapy Evaluation Patient Details Name: Erika [name] MRN: [id number] DOB: 20-Dec-1940 Today's Date: 03/11/2021   History of Present Illness  80yF with CHF, atrial flutter, moderate AS, MDS here with acute blood loss anemia with melena. EGD 03/08/2021.  Clinical Impression  Pt admitted with above diagnosis. Mod assist with bed mobility and sit<>stand transfer from bed. Previously independent with rollator PTA per patient report. Limited assistance at home (son lives with her but has had recent stroke.) Very motivated to regain her strength and independence. Reports poor experience at SNF in the recent past and feels she would do better with more intensive therapy such as CIR. Pt currently with functional limitations due to the deficits listed below (see PT Problem List). Pt will benefit from skilled PT to increase their independence and safety with mobility to allow discharge to the venue listed below.       Follow Up Recommendations CIR    Equipment Recommendations  None recommended by PT (TBD next venue of care)    Recommendations for Other Services Rehab consult     Precautions / Restrictions Precautions Precautions: Fall Precaution Comments: monitor bp, hgb Restrictions Weight Bearing Restrictions: No      Mobility  Bed Mobility Overal bed mobility: Needs Assistance Bed Mobility: Rolling;Supine to Sit;Sit to Supine Rolling: Min guard   Supine to sit: Min assist Sit to supine: Mod assist   General bed mobility comments: Pt demos ability to roll in bed with cues for use of bed rail, able to bridge slightly and pull self up to [facility] with rails. Min assist for LEs out of bed to sit up , and mod assist for LEs back up into bed, cues for technique. Practiced scooting a/p and lateral along bed with great effort, min assist to scoot backwards.    Transfers Overall transfer level: Needs assistance Equipment used: Rolling walker (2 wheeled) Transfers: Sit to/from Stand Sit  to Stand: Mod assist;From elevated surface         General transfer comment: Practiced sit<>stand several times from EOB with cues for technique, hand placement, and tactile cues for anterior weight shift. Mod assist required for boost, bed elevated to help. Attempted to pivot and to side step along bed however pt unable to unload LEs. Practiced weight shifting. Able to stand approx 1 min max. Cues for upright posture and hand placement on RW.  Ambulation/Gait                Stairs            Wheelchair Mobility    Modified Rankin (Stroke Patients Only)       Balance Overall balance assessment: Needs assistance Sitting-balance support: Feet supported;No upper extremity supported Sitting balance-Leahy Scale: Good     Standing balance support: Bilateral upper extremity supported Standing balance-Leahy Scale: Poor                               Pertinent Vitals/Pain Pain Assessment: No/denies pain    Home Living Family/patient expects to be discharged to:: Private residence Living Arrangements: Children Available Help at Discharge: Family;Available 24 hours/day (states they are in process of getting an Aide however son stays with her - in poor health?) Type of Home: Apartment Home Access: Level entry     Home Layout: One level Home Equipment: Hospital bed;Wheelchair - [employer] - 4 wheels;Bedside commode Additional Comments: questionable assistance at home. States son had a stroke. another  son comes by to cook foot.    Prior Function Level of Independence: Independent with assistive device(s)         Comments: used rollator to ambulate, states she can still drive. was bathing dressing and cooking PTA per pt report.     Hand Dominance   Dominant Hand: Right    Extremity/Trunk Assessment   Upper Extremity Assessment Upper Extremity Assessment: Defer to OT evaluation    Lower Extremity Assessment Lower Extremity Assessment:  Generalized weakness       Communication   Communication: No difficulties  Cognition Arousal/Alertness: Awake/alert Behavior During Therapy: WFL for tasks assessed/performed Overall Cognitive Status: Within Functional Limits for tasks assessed                                        General Comments General comments (skin integrity, edema, etc.): BP sitting up in bed 96/48    Exercises General Exercises - Lower Extremity Ankle Circles/Pumps: AROM;Both;10 reps Quad Sets: Strengthening;Both;5 reps;Supine Gluteal Sets: Strengthening;Both;5 reps;Supine Short Arc Quad: Strengthening;Both;5 reps;Supine Straight Leg Raises: Strengthening;Both;5 reps;Supine   Assessment/Plan    PT Assessment Patient needs continued PT services  PT Problem List Decreased strength;Decreased range of motion;Decreased activity tolerance;Decreased balance;Decreased mobility;Decreased knowledge of use of DME;Obesity       PT Treatment Interventions DME instruction;Gait training;Functional mobility training;Therapeutic activities;Therapeutic exercise;Balance training;Neuromuscular re-education;Patient/family education    PT Goals (Current goals can be found in the Care Plan section)  Acute Rehab PT Goals Patient Stated Goal: Get well and go home PT Goal Formulation: With patient Time For Goal Achievement: 04/01/21 Potential to Achieve Goals: Good    Frequency Min 3X/week   Barriers to discharge Decreased caregiver support limited assist at home    Co-evaluation               AM-PAC PT "6 Clicks" Mobility  Outcome Measure Help needed turning from your back to your side while in a flat bed without using bedrails?: None Help needed moving from lying on your back to sitting on the side of a flat bed without using bedrails?: A Little Help needed moving to and from a bed to a chair (including a wheelchair)?: A Lot Help needed standing up from a chair using your arms (e.g.,  wheelchair or bedside chair)?: A Lot Help needed to walk in hospital room?: Total Help needed climbing 3-5 steps with a railing? : Total 6 Click Score: 13    End of Session Equipment Utilized During Treatment: Gait belt Activity Tolerance: Patient limited by fatigue Patient left: in bed;with call bell/phone within reach;with bed alarm set Nurse Communication: Mobility status PT Visit Diagnosis: Muscle weakness (generalized) (M62.81);Difficulty in walking, not elsewhere classified (R26.2)    Time: [id number] PT Time Calculation (min) (ACUTE ONLY): 31 min   Charges:   PT Evaluation $PT Eval Moderate Complexity: 1 Mod PT Treatments $Therapeutic Activity: 8-22 mins        Elayne Snare, PT, DPT  Erika [name] 03/21/2021, 3:08 PM

## 2021-03-19 DIAGNOSIS — D649 Anemia, unspecified: Secondary | ICD-10-CM | POA: Diagnosis not present

## 2021-03-19 DIAGNOSIS — I480 Paroxysmal atrial fibrillation: Secondary | ICD-10-CM | POA: Diagnosis not present

## 2021-03-19 DIAGNOSIS — Z7901 Long term (current) use of anticoagulants: Secondary | ICD-10-CM | POA: Diagnosis not present

## 2021-03-19 DIAGNOSIS — K2990 Gastroduodenitis, unspecified, without bleeding: Secondary | ICD-10-CM

## 2021-03-19 DIAGNOSIS — D696 Thrombocytopenia, unspecified: Secondary | ICD-10-CM

## 2021-03-19 DIAGNOSIS — K922 Gastrointestinal hemorrhage, unspecified: Secondary | ICD-10-CM | POA: Diagnosis not present

## 2021-03-19 LAB — COMPREHENSIVE METABOLIC PANEL
ALT: 17 U/L (ref 0–44)
AST: 26 U/L (ref 15–41)
Albumin: 3 g/dL — ABNORMAL LOW (ref 3.5–5.0)
Alkaline Phosphatase: 309 U/L — ABNORMAL HIGH (ref 38–126)
Anion gap: 11 (ref 5–15)
BUN: 91 mg/dL — ABNORMAL HIGH (ref 8–23)
CO2: 20 mmol/L — ABNORMAL LOW (ref 22–32)
Calcium: 8.9 mg/dL (ref 8.9–10.3)
Chloride: 105 mmol/L (ref 98–111)
Creatinine, Ser: 2.63 mg/dL — ABNORMAL HIGH (ref 0.44–1.00)
GFR, Estimated: 18 mL/min — ABNORMAL LOW (ref >60)
Glucose, Bld: 81 mg/dL (ref 70–99)
Potassium: 4.8 mmol/L (ref 3.5–5.1)
Sodium: 136 mmol/L (ref 135–145)
Total Bilirubin: 1.4 mg/dL — ABNORMAL HIGH (ref 0.3–1.2)
Total Protein: 6.1 g/dL — ABNORMAL LOW (ref 6.5–8.1)

## 2021-03-19 LAB — CBC
HCT: 22.8 % — ABNORMAL LOW (ref 36.0–46.0)
Hemoglobin: 7.2 g/dL — ABNORMAL LOW (ref 12.0–15.0)
MCH: 30.1 pg (ref 26.0–34.0)
MCHC: 31.6 g/dL (ref 30.0–36.0)
MCV: 95.4 fL (ref 80.0–100.0)
Platelets: 111 10*3/uL — ABNORMAL LOW (ref 150–400)
RBC: 2.39 MIL/uL — ABNORMAL LOW (ref 3.87–5.11)
RDW: 18.2 % — ABNORMAL HIGH (ref 11.5–15.5)
WBC: 156.8 10*3/uL (ref 4.0–10.5)
nRBC: 0 % (ref 0.0–0.2)

## 2021-03-19 LAB — GLUCOSE, CAPILLARY
Glucose-Capillary: 94 mg/dL (ref 70–99)
Glucose-Capillary: 90 mg/dL (ref 70–99)
Glucose-Capillary: 94 mg/dL (ref 70–99)
Glucose-Capillary: 81 mg/dL (ref 70–99)

## 2021-03-19 LAB — PREPARE RBC (CROSSMATCH)

## 2021-03-19 MED ORDER — METOPROLOL TARTRATE 12.5 MG HALF TABLET
12.5000 mg | ORAL_TABLET | Freq: Two times a day (BID) | ORAL | Status: DC
  Administered 2021-03-19 – 2021-03-21 (×5): 12.5 mg via ORAL
  Filled 2021-03-19 (×5): qty 1

## 2021-03-19 MED ORDER — SODIUM CHLORIDE 0.9% IV SOLUTION: Freq: Once | INTRAVENOUS | Status: AC

## 2021-03-19 MED ORDER — FUROSEMIDE 10 MG/ML IJ SOLN
80.0000 mg | Freq: Two times a day (BID) | INTRAMUSCULAR | Status: AC
  Administered 2021-03-19 (×2): 80 mg via INTRAVENOUS
  Filled 2021-03-19 (×2): qty 8

## 2021-03-19 MED ORDER — FUROSEMIDE 10 MG/ML IJ SOLN: 80.0000 mg | Freq: Two times a day (BID) | INTRAMUSCULAR | Status: DC

## 2021-03-19 NOTE — Progress Notes (Signed)
   03/19/21 1934 03/19/21 2059  Assess: MEWS Score  Temp 97.8 F (36.6 C) (!) 97.5 F (36.4 C)  BP (!) 101/58 (!) 106/56  Pulse Rate (!) 115 (!) 115  Resp 20 18  SpO2  --  97 %  O2 Device  --  Room Air  Assess: MEWS Score  MEWS Temp 0 0  MEWS Systolic 0 0  MEWS Pulse 2 2  MEWS RR 0 0  MEWS LOC 0 0  MEWS Score 2 2  MEWS Score Color Yellow Yellow  Assess: if the MEWS score is Yellow or Red  Were vital signs taken at a resting state?  --  Yes  Focused Assessment  --  No change from prior assessment  Early Detection of Sepsis Score *See Row Information*  --  Low  MEWS guidelines implemented *See Row Information*  --  No, previously yellow, continue vital signs every 4 hours  Document  Patient Outcome  --  Stabilized after interventions  Progress note created (see row info)  --  Yes

## 2021-03-19 NOTE — Progress Notes (Signed)
   03/19/21 0431  Assess: MEWS Score  Temp 98 F (36.7 C)  BP (!) 104/56  Pulse Rate (!) 116  ECG Heart Rate (!) 117  Resp 20  SpO2 100 %  Assess: MEWS Score  MEWS Temp 0  MEWS Systolic 0  MEWS Pulse 2  MEWS RR 0  MEWS LOC 0  MEWS Score 2  MEWS Score Color Yellow  Assess: if the MEWS score is Yellow or Red  Were vital signs taken at a resting state? Yes  Focused Assessment No change from prior assessment  Early Detection of Sepsis Score *See Row Information* Low  MEWS guidelines implemented *See Row Information* No, previously yellow, continue vital signs every 4 hours  Document  Patient Outcome Stabilized after interventions  Progress note created (see row info) Yes

## 2021-03-19 NOTE — Progress Notes (Signed)
   03/19/21 2141 03/19/21 2241  Assess: MEWS Score  Temp  --  97.6 F (36.4 C)  BP (!) 106/56 94/60  Pulse Rate  --  80  Resp  --  18  SpO2  --  98 %  O2 Device  --  Room Air  Assess: MEWS Score  MEWS Temp 0 0  MEWS Systolic 0 1  MEWS Pulse 2 0  MEWS RR 0 0  MEWS LOC 0 0  MEWS Score 2 1  MEWS Score Color Yellow Green  Assess: if the MEWS score is Yellow or Red  Were vital signs taken at a resting state?  --  Yes  Focused Assessment  --  No change from prior assessment  Early Detection of Sepsis Score *See Row Information*  --  Low  MEWS guidelines implemented *See Row Information*  --  No, previously yellow, continue vital signs every 4 hours  Document  Patient Outcome  --  Stabilized after interventions  Progress note created (see row info)  --  Yes

## 2021-03-19 NOTE — Progress Notes (Signed)
   03/19/21 2141  Assess: MEWS Score  BP (!) 106/56  Assess: MEWS Score  MEWS Temp 0  MEWS Systolic 0  MEWS Pulse 2  MEWS RR 0  MEWS LOC 0  MEWS Score 2  MEWS Score Color Yellow  Assess: if the MEWS score is Yellow or Red  Were vital signs taken at a resting state? Yes  Focused Assessment No change from prior assessment  Early Detection of Sepsis Score *See Row Information* Low  MEWS guidelines implemented *See Row Information* No, previously yellow, continue vital signs every 4 hours  Document  Patient Outcome Stabilized after interventions  Progress note created (see row info) Yes

## 2021-03-19 NOTE — Plan of Care (Signed)
  Problem: Elimination: Goal: Will not experience complications related to bowel motility Outcome: Completed/Met

## 2021-03-19 NOTE — Progress Notes (Signed)
Inpatient Rehab Admissions Coordinator Note:   Per PT recommendation, pt was screened for CIR candidacy by Gayland Curry, MS, CCC-SLP.  At this time we are recommending an inpatient rehab consult.  Please place an IP Rehab MD consult order if pt would like to be considered.  Please contact me with questions.    Gayland Curry, [place], [place] Admissions Coordinator [phone] 5:56 PM

## 2021-03-19 NOTE — Plan of Care (Signed)
  Problem: Safety: Goal: Ability to remain free from injury will improve Outcome: Completed/Met   Problem: Pain Managment: Goal: General experience of comfort will improve Outcome: Completed/Met   Problem: Elimination: Goal: Will not experience complications related to urinary retention Outcome: Completed/Met

## 2021-03-19 NOTE — Progress Notes (Signed)
[facility] ROUNDING NOTE   Subjective: Reported dark stool by notes.  Patient did not see her BM.  Otherwise no abdominal pain.  EGD completed yesterday and only notable for mild nonulcer gastritis (biopsies pending).  Otherwise normal esophagus and duodenum.  H/H 7.2/23.8 (slight downtrend from 7.7).   Objective: Vital signs in last 24 hours: Temp:  [97.3 F (36.3 C)-98.3 F (36.8 C)] 98 F (36.7 C) (08/14 1120) Pulse Rate:  [77-129] 113 (08/14 1120) Resp:  [14-22] 20 (08/14 1120) BP: (80-120)/(38-67) 105/51 (08/14 1120) SpO2:  [91 %-100 %] 100 % (08/14 1120) Weight:  [id number] kg] 133 kg (08/14 0431) Last BM Date: 03/14/2021 General: NAD Abdomen:  Soft, NT, ND, +BS   Intake/Output from previous day: 08/13 0701 - 08/14 0700 In: 1429.2 [P.O.:840; I.V.:274.3; IV Piggyback:314.9] Out: 1450 [Urine:1450] Intake/Output this shift: Total I/O In: -  Out: 450 [Urine:450]   Lab Results: Recent Labs    03/17/21 1530 03/17/21 2028 03/19/21 0613  WBC 169.6* 178.8* 156.8*  HGB 7.1* 7.7*  7.7* 7.2*  PLT 120* 119* 111*  MCV 94.3 100.8* 95.4   BMET Recent Labs    03/17/21 1618 03/19/2021 0441 03/19/21 0613  NA 138 137 136  K 4.9 4.3 4.8  CL 108 106 105  CO2 20* 17* 20*  GLUCOSE 106* 39* 81  BUN 103* 99* 91*  CREATININE 3.15* 3.00* 2.63*  CALCIUM 8.5* 8.8* 8.9   LFT Recent Labs    03/17/21 0838 03/19/21 0613  PROT 6.3* 6.1*  ALBUMIN 3.1* 3.0*  AST 26 26  ALT 18 17  ALKPHOS 342* 309*  BILITOT 0.7 1.4*  BILIDIR 0.5*  --   IBILI 0.2*  --    PT/INR Recent Labs    03/17/21 1530 03/13/2021 0832  INR 2.0* 1.8*      Imaging/Other results: DG CHEST PORT 1 VIEW  Result Date: 03/17/2021 CLINICAL DATA:  Shortness of breath for 3-4 days. EXAM: PORTABLE CHEST 1 VIEW COMPARISON:  Radiograph yesterday FINDINGS: Unchanged cardiomegaly. Stable prominence of the central pulmonary arteries. Stable mediastinal contours with aortic atherosclerosis. No pneumothorax  or pleural effusion. No confluent airspace disease. No convincing pulmonary edema. Mild diffuse interstitial opacities arch are stable, favor chronic. Suspected calcified granuloma at the right lung base. No acute osseous abnormalities are seen. IMPRESSION: Unchanged cardiomegaly. No acute findings or change from prior exam. Electronically Signed   By: Keith Rake M.D.   On: 03/17/2021 15:41      Assessment and Plan:  1) Acute on chronic anemia 2) Melena 3) Gastritis - EGD on 03/10/2021 with some mild nonbleeding gastritis.  Biopsies pending - Reported episode of melena overnight with repeat H/H 7.2/22.8. - Plan for video capsule endoscopy for small bowel interrogation - If VCE unrevealing, may need to consider repeat colonoscopy - Continue CBC trend with blood products as needed per protocol - Continue Protonix 40 mg/day - Holding Eliquis for now  4) AKI on CKD 3 5) CHF 6) UTI - Management per primary Medicine service  7) Myeloproliferative disease - Chronic thrombocytopenia and anemia with significant leukocytosis - Management per primary service  I updated the patient along with her son by telephone.     Lavena Bullion, DO  03/19/2021, 11:50 AM [facility] Pager [phone]

## 2021-03-19 NOTE — Progress Notes (Signed)
   03/19/21 1934  Assess: MEWS Score  Temp 97.8 F (36.6 C)  BP (!) 101/58  Pulse Rate (!) 115  ECG Heart Rate (!) 117  Resp 20  SpO2 96 %  Assess: MEWS Score  MEWS Temp 0  MEWS Systolic 0  MEWS Pulse 2  MEWS RR 0  MEWS LOC 0  MEWS Score 2  MEWS Score Color Yellow  Assess: if the MEWS score is Yellow or Red  Were vital signs taken at a resting state? Yes  Focused Assessment No change from prior assessment  Early Detection of Sepsis Score *See Row Information* Low  MEWS guidelines implemented *See Row Information* No, previously yellow, continue vital signs every 4 hours  Document  Patient Outcome Stabilized after interventions  Progress note created (see row info) Yes

## 2021-03-19 NOTE — Progress Notes (Signed)
   03/19/21 2141 03/19/21 2241 03/19/21 2318  Assess: MEWS Score  Temp  --  97.6 F (36.4 C) 98.3 F (36.8 C)  BP (!) 106/56 94/60 (!) 95/57  Pulse Rate  --  80 80  Resp  --  18 18  SpO2  --   --  98 %  O2 Device  --   --  Room Air  Assess: MEWS Score  MEWS Temp 0 0 0  MEWS Systolic 0 1 1  MEWS Pulse 2 0 0  MEWS RR 0 0 0  MEWS LOC 0 0 0  MEWS Score '2 1 1  '$ MEWS Score Color Yellow Green Green  Assess: if the MEWS score is Yellow or Red  Were vital signs taken at a resting state?  --   --  Yes  Focused Assessment  --   --  No change from prior assessment  Early Detection of Sepsis Score *See Row Information*  --   --  Low  MEWS guidelines implemented *See Row Information*  --   --  No, other (Comment) (pt VS have improved due to BP meds and recieving blood)  Document  Patient Outcome  --   --  Stabilized after interventions  Progress note created (see row info)  --   --  Yes

## 2021-03-19 NOTE — Progress Notes (Signed)
HD#3 Subjective:  Patient Summary: Ms Angelena Kiraly is a 80 year old female with Pmhx of myeloproliferative neoplasm, atrial flutter on Eliquis, HFpEF admitted for acute on chronic normocytic anemia in setting of GI bleed complicated by acute on chronic HFpEF exacerbation, acute on chronic renal failure and urinary tract infection.   Overnight Events: Overnight, patient reported to be tachycardic to 120's; however otherwise remained hemodynamically stable.    Interim History: Ms Rients was evaluated at bedside this morning; she is resting comfortably in bed and denies any pain at this time. She reports having one bowel movement yesterday but did not note the color; RN confirmed this was tarry. She denies any abdominal pain at this time. She denies any chest pain or shortness of breath.   Objective:  Vital signs in last 24 hours: Vitals:   03/16/2021 2000 03/22/2021 2306 03/19/21 0431 03/19/21 0713  BP: (!) 95/58 (!) 105/54 (!) 104/56 (!) 101/58  Pulse: (!) 111 (!) 120 (!) 116 (!) 117  Resp: '15 18 20 16  '$ Temp:  98.3 F (36.8 C) 98 F (36.7 C) 98.1 F (36.7 C)  TempSrc:  Oral Oral Oral  SpO2: 99% 99% 100% 99%  Weight:   133 kg   Height:       Supplemental O2: Nasal Cannula SpO2: 99 % O2 Flow Rate (L/min): 2 L/min   Physical Exam:  Constitutional: pleasant, chronically ill-appearing female laying in bed, no acute distress Cardiovascular: regular rate and rhythm, systolic murmur, no rubs or gallop noted; 2+ bilateral lower extremity edema to the knee with +JVD (12cm)  Pulmonary/Chest: Normal effort on 2L oxygen, diminished lung sounds at bilateral bases Abdominal: soft, non-tender, non-distended, normal bowel sounds  Neurological: alert & oriented x 3 Skin: warm and dry  Filed Weights   03/21/2021 0228 03/22/2021 1109 03/19/21 0431  Weight: 131.4 kg 131 kg 133 kg    Intake/Output Summary (Last 24 hours) at 03/19/2021 0737 Last data filed at 03/19/2021 0700 Gross per 24 hour   Intake 1429.18 ml  Output 1450 ml  Net -20.82 ml   Net IO Since Admission: 406.53 mL [03/19/21 0737]  Pertinent Labs: CBC Latest Ref Rng & Units 03/19/2021 03/17/2021 03/17/2021  WBC 4.0 - 10.5 K/uL 156.8(HH) 178.8(HH) -  Hemoglobin 12.0 - 15.0 g/dL 7.2(L) 7.7(L) 7.7(L)  Hematocrit 36.0 - 46.0 % 22.8(L) 25.4(L) 24.2(L)  Platelets 150 - 400 K/uL 111(L) 119(L) -    CMP Latest Ref Rng & Units 03/19/2021 03/25/2021 03/17/2021  Glucose 70 - 99 mg/dL 81 39(LL) 106(H)  BUN 8 - 23 mg/dL 91(H) 99(H) 103(H)  Creatinine 0.44 - 1.00 mg/dL 2.63(H) 3.00(H) 3.15(H)  Sodium 135 - 145 mmol/L 136 137 138  Potassium 3.5 - 5.1 mmol/L 4.8 4.3 4.9  Chloride 98 - 111 mmol/L 105 106 108  CO2 22 - 32 mmol/L 20(L) 17(L) 20(L)  Calcium 8.9 - 10.3 mg/dL 8.9 8.8(L) 8.5(L)  Total Protein 6.5 - 8.1 g/dL 6.1(L) - -  Total Bilirubin 0.3 - 1.2 mg/dL 1.4(H) - -  Alkaline Phos 38 - 126 U/L 309(H) - -  AST 15 - 41 U/L 26 - -  ALT 0 - 44 U/L 17 - -    Imaging: No results found.  Assessment/Plan:   Principal Problem:   GI bleed Active Problems:   AF (paroxysmal atrial fibrillation) (HCC)   Myelodysplastic syndrome, unspecified (HCC)   Acute on chronic renal failure (HCC)   Acute on chronic heart failure with preserved ejection fraction (HFpEF) ([place])  Chronic anticoagulation   Melena   Acute on chronic anemia   Gastritis and gastroduodenitis   Patient Summary: Ms Cristi Carnley is a 80 year old female with Pmhx of myeloproliferative neoplasm, atrial flutter on Eliquis, HFpEF admitted for acute on chronic normocytic anemia in setting of GI bleed complicated by acute on chronic HFpEF exacerbation, acute on chronic renal failure and urinary tract infection.   Acute on chronic macrocytic anemia  GI bleed  Patient has received 3u pRBC since admission. EGD on 8/13 with mild nonbleeding nonulcerated gastritis. Hb 7.2 this morning following an episode of melena overnight.if continues to have persistent dark stools  or downtrending Hb, would benefit from repeat colonoscopy vs capsule endoscopy.  - GI consulted, appreciate recommendations  - Continue to trend CBC's, transfuse for Hb<7 - Protonix '40mg'$  daily   Acute on chronic HFpEF exacerbation Patient continues to be significantly hypervolemic on examination with pitting edema of bilateral lower extremities to knees and significant JVD with depressed bibasilar lung sounds. Patient did receive one dose of IV Lasix '40mg'$  yesterday with 1.4L UOP.  No significant changes in weight noted. Her MAP's have improved; will increase diuresis today. Patient has been tachycardic overnight; her home dose metoprolol was initially held in setting of hypotension and GI bleed.  - IV Lasix '80mg'$  bid  - Metoprolol 12.'5mg'$  bid  - Continue cardiac monitoring - Strict I&O and daily weights - Monitor volume status and trend BMP  Sepsis 2/2 E.coli UTI Patient noted to have >100K colonies of pan-sensitive E.coli on urine culture. Blood culture with 1/2 bottles Staph epidermidis, likely contaminant. Leukocytosis and hypotension are improving. Suspect her GI bleed could have been contributing to the hypotension as well.  - IV Rocephin 1g daily  - Trend CBC - Monitor vitals   AKI on CKDIIIb Serum Cr slightly improved this AM 3.0>2.63 this morning following diuresis. Will increase diuresis as above.  - Continue to monitor renal function - Avoid nephrotoxic agents as able   Myeloproliferative neoplasm Trend CBC's   Diet:  Full Liquid IVF: None,None VTE: SCDs Code: Full PT/OT recs: CIR, none.  Prior to Admission Living Arrangement: Home Anticipated Discharge Location: CIR Barriers to Discharge: Continued medical management  Dispo: Anticipated discharge to Rehab in 3 days pending clinical improvement .   Harvie Heck, MD Internal Medicine Resident PGY-3 Pager# [phone]  Please contact the on call pager after 5 pm and on weekends at [phone].

## 2021-03-20 ENCOUNTER — Encounter (HOSPITAL_COMMUNITY)
Admission: EM | Disposition: E | Payer: Self-pay | Source: Home / Self Care | Attending: Student in an Organized Health Care Education/Training Program

## 2021-03-20 DIAGNOSIS — K922 Gastrointestinal hemorrhage, unspecified: Secondary | ICD-10-CM | POA: Diagnosis not present

## 2021-03-20 HISTORY — PX: GIVENS CAPSULE STUDY: SHX5432

## 2021-03-20 LAB — GLUCOSE, CAPILLARY
Glucose-Capillary: 108 mg/dL — ABNORMAL HIGH (ref 70–99)
Glucose-Capillary: 83 mg/dL (ref 70–99)
Glucose-Capillary: 87 mg/dL (ref 70–99)
Glucose-Capillary: 98 mg/dL (ref 70–99)

## 2021-03-20 LAB — BPAM RBC
Blood Product Expiration Date: 202209162359
ISSUE DATE / TIME: 202208142257
Unit Type and Rh: 5100

## 2021-03-20 LAB — PREPARE PLATELET PHERESIS: Unit division: 0

## 2021-03-20 LAB — TYPE AND SCREEN
ABO/RH(D): O POS
Antibody Screen: NEGATIVE
Unit division: 0

## 2021-03-20 LAB — BPAM PLATELET PHERESIS
Blood Product Expiration Date: 202208142359
ISSUE DATE / TIME: 202208141745
Unit Type and Rh: 6200

## 2021-03-20 LAB — BASIC METABOLIC PANEL
Anion gap: 10 (ref 5–15)
BUN: 82 mg/dL — ABNORMAL HIGH (ref 8–23)
CO2: 23 mmol/L (ref 22–32)
Calcium: 9 mg/dL (ref 8.9–10.3)
Chloride: 105 mmol/L (ref 98–111)
Creatinine, Ser: 2.26 mg/dL — ABNORMAL HIGH (ref 0.44–1.00)
GFR, Estimated: 22 mL/min — ABNORMAL LOW (ref >60)
Glucose, Bld: 86 mg/dL (ref 70–99)
Potassium: 4 mmol/L (ref 3.5–5.1)
Sodium: 138 mmol/L (ref 135–145)

## 2021-03-20 LAB — CBC
HCT: 24.2 % — ABNORMAL LOW (ref 36.0–46.0)
Hemoglobin: 7.9 g/dL — ABNORMAL LOW (ref 12.0–15.0)
MCH: 30.6 pg (ref 26.0–34.0)
MCHC: 32.6 g/dL (ref 30.0–36.0)
MCV: 93.8 fL (ref 80.0–100.0)
Platelets: 109 10*3/uL — ABNORMAL LOW (ref 150–400)
RBC: 2.58 MIL/uL — ABNORMAL LOW (ref 3.87–5.11)
RDW: 18.4 % — ABNORMAL HIGH (ref 11.5–15.5)
WBC: 147.4 10*3/uL (ref 4.0–10.5)
nRBC: 0 % (ref 0.0–0.2)

## 2021-03-20 SURGERY — IMAGING PROCEDURE, GI TRACT, INTRALUMINAL, VIA CAPSULE

## 2021-03-20 MED ORDER — GUAIFENESIN-DM 100-10 MG/5ML PO SYRP
5.0000 mL | ORAL_SOLUTION | ORAL | Status: DC | PRN
  Administered 2021-03-20: 5 mL via ORAL
  Filled 2021-03-20: qty 5

## 2021-03-20 MED ORDER — FUROSEMIDE 10 MG/ML IJ SOLN
80.0000 mg | Freq: Two times a day (BID) | INTRAMUSCULAR | Status: AC
  Administered 2021-03-20 (×2): 80 mg via INTRAVENOUS
  Filled 2021-03-20 (×2): qty 8

## 2021-03-20 NOTE — Progress Notes (Signed)
Physical Therapy Treatment Patient Details Name: Erika [name] MRN: [id number] DOB: [birth year] Today's Date: 03/30/2021    History of Present Illness 80yF with CHF, atrial flutter, moderate AS, MDS here with acute blood loss anemia with melena. EGD 03/10/2021.    PT Comments    Today's skilled session focused on LE strengthening with no issues noted or reported. Mobility limited due to procedure pt undergoing in bed with RN unsure of mobility with the Endo device. Acute PT to continue during pt's hospital stay.     Follow Up Recommendations  CIR     Equipment Recommendations  None recommended by PT (TBD at next venue of care)    Recommendations for Other Services Rehab consult     Precautions / Restrictions Precautions Precautions: Fall Precaution Comments: monitor bp, hgb Restrictions Weight Bearing Restrictions: No    Cognition Arousal/Alertness: Awake/alert Behavior During Therapy: WFL for tasks assessed/performed Overall Cognitive Status: Within Functional Limits for tasks assessed                      Exercises General Exercises - Lower Extremity Ankle Circles/Pumps: AROM;Both;10 reps;Limitations Ankle Circles/Pumps Limitations: manual resistance for PF Quad Sets: AROM;Strengthening;Both;10 reps;Supine Heel Slides: AROM;AAROM;Strengthening;Both;10 reps;Supine Hip ABduction/ADduction: AROM;AAROM;Strengthening;Both;10 reps;Supine Straight Leg Raises: AROM;AAROM;Strengthening;Both;10 reps;Supine     Pertinent Vitals/Pain Pain Assessment: No/denies pain     PT Goals (current goals can now be found in the care plan section) Acute Rehab PT Goals Patient Stated Goal: Get well and go home PT Goal Formulation: With patient Time For Goal Achievement: 04/01/21 Potential to Achieve Goals: Good Progress towards PT goals: Progressing toward goals    Frequency    Min 3X/week      PT Plan Current plan remains appropriate    AM-PAC PT "6 Clicks" Mobility    Outcome Measure  Help needed turning from your back to your side while in a flat bed without using bedrails?: None Help needed moving from lying on your back to sitting on the side of a flat bed without using bedrails?: A Little Help needed moving to and from a bed to a chair (including a wheelchair)?: A Lot Help needed standing up from a chair using your arms (e.g., wheelchair or bedside chair)?: A Lot Help needed to walk in hospital room?: Total Help needed climbing 3-5 steps with a railing? : Total 6 Click Score: 13    End of Session Equipment Utilized During Treatment: Gait belt Activity Tolerance: Patient tolerated treatment well;Patient limited by fatigue;Other (comment) (mobility limited due to pt having capusule endo performed and RN unsure about moblity restrictions, device new to this therapist.) Patient left: in bed;with call bell/phone within reach;with bed alarm set;with nursing/sitter in room Nurse Communication: Mobility status PT Visit Diagnosis: Muscle weakness (generalized) (M62.81);Difficulty in walking, not elsewhere classified (R26.2)     Time: [id number] PT Time Calculation (min) (ACUTE ONLY): 10 min  Charges:  $Therapeutic Exercise: 8-22 mins                    Willow Ora, PTA, [facility] Acute Rehab Services [phone] 03/12/2021, 2:36 PM   Willow Ora 03/16/2021, 2:36 PM

## 2021-03-20 NOTE — Progress Notes (Signed)
HD#5 SUBJECTIVE:  Patient Summary: [name] is a 80 year old female with Pmhx of myeloproliferative neoplasm, atrial flutter on Eliquis, HFpEF admitted for acute on chronic normocytic anemia in setting of GI bleed complicated by acute on chronic HFpEF exacerbation, acute on chronic renal failure and urinary tract infection.   Overnight Events: No acute events overnight  Interim History: This is hospital day 5 for Erika [name] who was seen and evaluated at the bedside this morning. She is more fatigued today, despite getting a good night's rest. She denies any shortness of breath or chest pain at this time, and remains off of supplemental oxygen. She also states that she did not have a bowel movement yesterday. The patient did have 1.7L urine output after being diuresed with '80mg'$  lasix twice yesterday. Denies any dysuria or hematuria at this time, as well.   OBJECTIVE:  Vital Signs: Vitals:   03/19/21 2241 03/19/21 2318 04/04/2021 0215 03/10/2021 0400  BP: 94/60 (!) 95/57 (!) 103/56 (!) 106/59  Pulse: 80 80  73  Resp: '18 18  17  '$ Temp: 97.6 F (36.4 C) 98.3 F (36.8 C) 97.9 F (36.6 C) 97.8 F (36.6 C)  TempSrc: Oral Oral Oral Oral  SpO2: 98% 98%  96%  Weight:    132.6 kg  Height:       Supplemental O2: Room Air SpO2: 96%  Filed Weights   03/07/2021 1109 03/19/21 0431 03/28/2021 0400  Weight: 131 kg 133 kg 132.6 kg     Intake/Output Summary (Last 24 hours) at 03/19/2021 [id number] Last data filed at 03/27/2021 0500 Gross per 24 hour  Intake 1564.33 ml  Output 3050 ml  Net -1485.67 ml   Net IO Since Admission: -1,319.14 mL [03/29/2021 0659]  Physical Exam: Constitutional: pleasant, chronically ill-appearing female laying in bed, no acute distress Cardiovascular: regular rate and rhythm, systolic murmur, no rubs or gallop noted; 2+ bilateral lower extremity edema to the knee with +JVD  Pulmonary/Chest: Normal effort on roomair, diminished lung sounds at bilateral bases Abdominal:  soft, non-tender, non-distended, normal bowel sounds  Neurological: alert & oriented x 3 Skin: warm and dry    ASSESSMENT/PLAN:  Assessment: Principal Problem:   GI bleed Active Problems:   AF (paroxysmal atrial fibrillation) (HCC)   Myelodysplastic syndrome, unspecified (HCC)   Acute on chronic renal failure (HCC)   Acute on chronic heart failure with preserved ejection fraction (HFpEF) (HCC)   Chronic anticoagulation   Melena   Acute on chronic anemia   Gastritis and gastroduodenitis   Plan: #Acute on chronic macrocytic anemia  #GI bleed  Patient has received 4 units pRBC and 1 unit of platelets since admission. Hb stable at 7.9 this morning and she did not have any melanotic stools in the last day. Patient swallowed capsule pill at 7am today for video capsule endoscopy per GI to assess for bleeding.  - GI consulted, appreciate recommendations   - Video capsule endoscopy results pending - Continue to trend CBC's, transfuse for Hb<7 - Continue Protonix '40mg'$  daily  - Continue to hold Eliquis for now  #Acute on chronic HFpEF exacerbation Patient remains significantly hypervolemic on examination, with pitting edema of bilateral lower extremities and significant JVD and depressed bibasilar lung sounds. Patient did receive two doses of IV lasix '80mg'$  yesterday with 1.7L UOP.  No significant changes in weight noted over the last few days. Her metoprolol was initially held in the setting of her hypotension, however, it was restarted on 8/14 for tachycardia and  improved blood pressures.  - IV Lasix '80mg'$  bid again today - Metoprolol 12.'5mg'$  bid  - Continue cardiac monitoring - Strict I&Os and daily weights - Monitor volume status and trend BMP  #Sepsis 2/2 E.coli urinary tract infection Leukocytosis and hypotension are improving, and the patient denies any urinary symptoms at this time. Patient was originally hypotensive on admission, however, this has improved over the last few days.  Suspect her GI bleed could have been contributing to the hypotension as well.  - Continue IV Rocephin 1g daily  - Trend CBC - Monitor vitals   #AKI on CKDIIIb Serum creatinine improved this AM 2.63>2.26 following diuresis yesterday with lasix '80mg'$  BID. Will continue with diuresis again today, as noted above.  - Continue to monitor renal function - Avoid nephrotoxic agents as able   #Myeloproliferative neoplasm Leukocytosis improving, although still elevated beyond the patient's baseline at 147.  - Trend CBC's   Diet: Full Liquid IVF: None VTE: SCDs (holding off on therapeutic eliquis in the setting of GI bleed) Code: Full PT/OT recs: CIR, none.  Prior to Admission Living Arrangement: Home Anticipated Discharge Location: CIR Barriers to Discharge: Continued medical management  Dispo: Anticipated discharge to Rehab in 3 days pending clinical improvement.  Signature: Buddy Duty, D.O.  Internal Medicine Resident, PGY-1 [facility]  Pager: [phone] 6:59 AM, 04/02/2021   Please contact the on call pager after 5 pm and on weekends at [phone].

## 2021-03-20 NOTE — Progress Notes (Signed)
Patient swallowed capsule pill at 0711, instructions explained to patient and bedside RN. Will pick up study and monitor in the AM ( 8/16).

## 2021-03-20 NOTE — Progress Notes (Signed)
Inpatient Rehabilitation Admissions Coordinator   Inpatient rehab consult received. I met with patient at bedside for assessment. We discussed goals and expectations of a possible Cir admit pending her progress with therapies on acute, as well as Health team advantage approval. I have placed an order for OT eval to assist in assessment of dispo options. I will await further progress with therapies and follow up tomorrow. Dispo will be CIR vs SNF rehab.   Danne Baxter, RN, MSN Rehab Admissions Coordinator [phone] 03/19/2021 12:15 PM

## 2021-03-20 NOTE — Care Management Important Message (Signed)
Important Message  Patient Details  Name: Erika [name] MRN: [id number] Date of Birth: [birth year]   Medicare Important Message Given:  Yes     [name] 03/22/2021, 9:13 AM

## 2021-03-20 NOTE — Consult Note (Signed)
[facility] ROUNDING NOTE   Subjective: No significant events overnight.  Capsule endoscopy delayed yesterday because of patient needed to be on clear diet.  Capsule was swallowed this morning at 7 am.  Patient denies any bowel movements yesterday.  No GI complaints.  Denies chest pain/dyspnea.  Leg swelling improving. Hgb 7.9 from 7.2 yesterday   Objective: Vital signs in last 24 hours: Temp:  [97.5 F (36.4 C)-98.6 F (37 C)] 97.8 F (36.6 C) (08/15 0916) Pulse Rate:  [73-115] 82 (08/15 1111) Resp:  [13-20] 16 (08/15 1111) BP: (94-109)/(50-65) 103/65 (08/15 1111) SpO2:  [96 %-99 %] 99 % (08/15 1111) Weight:  [132.6 kg] 132.6 kg (08/15 0400) Last BM Date: 03/19/21 General: NAD, Obese Af Am fm, lying in bed Abdomen: Soft, NT, ND, +BS CV:  RRR, no murmurs Ext: 2+ pitting edema b/l     Intake/Output from previous day: 08/14 0701 - 08/15 0700 In: 1324.3 [P.O.:820; I.V.:4.3; Blood:400; IV Piggyback:100] Out: 3050 [Urine:3050] Intake/Output this shift: Total I/O In: -  Out: 1100 [Urine:1100]   Lab Results: Recent Labs    03/17/21 2028 03/19/21 0613 03/29/2021 0643  WBC 178.8* 156.8* 147.4*  HGB 7.7*  7.7* 7.2* 7.9*  PLT 119* 111* 109*  MCV 100.8* 95.4 93.8   BMET Recent Labs    03/25/2021 0441 03/19/21 0613 03/27/2021 0643  NA 137 136 138  K 4.3 4.8 4.0  CL 106 105 105  CO2 17* 20* 23  GLUCOSE 39* 81 86  BUN 99* 91* 82*  CREATININE 3.00* 2.63* 2.26*  CALCIUM 8.8* 8.9 9.0   LFT Recent Labs    03/19/21 0613  PROT 6.1*  ALBUMIN 3.0*  AST 26  ALT 17  ALKPHOS 309*  BILITOT 1.4*   PT/INR Recent Labs    03/17/21 1530 03/14/2021 0832  INR 2.0* 1.8*      Imaging/Other results: No results found.    Assessment and Plan:  1) Acute on chronic anemia 2) Melena 3) Gastritis - EGD on 03/16/2021 with some mild nonbleeding gastritis.  Biopsies pending - Stable H/H with no BM yesterday - Capsule endoscopy will be completed later today, will  be read by tomorrow afternoon. - If VCE unrevealing and patient continues to show evidence of bleeding, may need to consider repeat colonoscopy.  If no ongoing bleeding/stable Hgb, would not repeat colonoscopy given that she just had one less than two months ago. - Continue CBC trend with blood products as needed per protocol - Continue Protonix 40 mg/day - Holding Eliquis for now  4) AKI on CKD 3 5) CHF 6) UTI - Management per primary Medicine service  7) Myeloproliferative disease - Chronic thrombocytopenia and anemia with significant leukocytosis - Management per primary service    Daryel November, MD  03/07/2021, 12:22 PM [facility] Pager [phone]

## 2021-03-21 ENCOUNTER — Encounter (HOSPITAL_COMMUNITY): Payer: Self-pay | Admitting: Gastroenterology

## 2021-03-21 LAB — SURGICAL PATHOLOGY

## 2021-03-21 LAB — CULTURE, BLOOD (ROUTINE X 2): Culture: NO GROWTH

## 2021-03-21 LAB — GLUCOSE, CAPILLARY
Glucose-Capillary: 84 mg/dL (ref 70–99)
Glucose-Capillary: 123 mg/dL — ABNORMAL HIGH (ref 70–99)
Glucose-Capillary: 96 mg/dL (ref 70–99)
Glucose-Capillary: 83 mg/dL (ref 70–99)

## 2021-03-21 LAB — CBC
HCT: 24.9 % — ABNORMAL LOW (ref 36.0–46.0)
Hemoglobin: 8 g/dL — ABNORMAL LOW (ref 12.0–15.0)
MCH: 30.5 pg (ref 26.0–34.0)
MCHC: 32.1 g/dL (ref 30.0–36.0)
MCV: 95 fL (ref 80.0–100.0)
Platelets: 97 10*3/uL — ABNORMAL LOW (ref 150–400)
RBC: 2.62 MIL/uL — ABNORMAL LOW (ref 3.87–5.11)
RDW: 18.8 % — ABNORMAL HIGH (ref 11.5–15.5)
WBC: 154.3 10*3/uL (ref 4.0–10.5)
nRBC: 0 % (ref 0.0–0.2)

## 2021-03-21 LAB — BASIC METABOLIC PANEL
Anion gap: 13 (ref 5–15)
BUN: 76 mg/dL — ABNORMAL HIGH (ref 8–23)
CO2: 23 mmol/L (ref 22–32)
Calcium: 9.1 mg/dL (ref 8.9–10.3)
Chloride: 101 mmol/L (ref 98–111)
Creatinine, Ser: 1.99 mg/dL — ABNORMAL HIGH (ref 0.44–1.00)
GFR, Estimated: 25 mL/min — ABNORMAL LOW (ref >60)
Glucose, Bld: 47 mg/dL — ABNORMAL LOW (ref 70–99)
Potassium: 3.7 mmol/L (ref 3.5–5.1)
Sodium: 137 mmol/L (ref 135–145)

## 2021-03-21 LAB — PATHOLOGIST SMEAR REVIEW

## 2021-03-21 MED ORDER — SODIUM CHLORIDE 0.9 % IV SOLN
1.0000 g | INTRAVENOUS | Status: DC
  Administered 2021-03-21 – 2021-03-22 (×2): 1 g via INTRAVENOUS
  Filled 2021-03-21: qty 10

## 2021-03-21 MED ORDER — APIXABAN 5 MG PO TABS
5.0000 mg | ORAL_TABLET | Freq: Two times a day (BID) | ORAL | Status: DC
  Administered 2021-03-21 – 2021-03-22 (×2): 5 mg via ORAL
  Filled 2021-03-21 (×2): qty 1

## 2021-03-21 MED ORDER — METOPROLOL TARTRATE 25 MG PO TABS
25.0000 mg | ORAL_TABLET | Freq: Two times a day (BID) | ORAL | Status: DC
  Administered 2021-03-21: 25 mg via ORAL
  Filled 2021-03-21 (×3): qty 1

## 2021-03-21 MED ORDER — SENNA 8.6 MG PO TABS
1.0000 | ORAL_TABLET | Freq: Every day | ORAL | Status: DC | PRN
  Administered 2021-03-21 – 2021-03-22 (×2): 8.6 mg via ORAL
  Filled 2021-03-21 (×2): qty 1

## 2021-03-21 MED ORDER — FUROSEMIDE 10 MG/ML IJ SOLN
80.0000 mg | Freq: Two times a day (BID) | INTRAMUSCULAR | Status: AC
  Administered 2021-03-21 (×2): 80 mg via INTRAVENOUS
  Filled 2021-03-21 (×2): qty 8

## 2021-03-21 MED ORDER — POLYETHYLENE GLYCOL 3350 17 G PO PACK: 17.0000 g | PACK | Freq: Every day | ORAL | Status: DC | PRN

## 2021-03-21 MED ORDER — ONDANSETRON HCL 4 MG/2ML IJ SOLN
4.0000 mg | Freq: Four times a day (QID) | INTRAMUSCULAR | Status: DC | PRN
  Administered 2021-03-21 (×2): 4 mg via INTRAVENOUS
  Filled 2021-03-21 (×2): qty 2

## 2021-03-21 MED ORDER — METOPROLOL TARTRATE 25 MG PO TABS: 25.0000 mg | ORAL_TABLET | Freq: Two times a day (BID) | ORAL | Status: DC

## 2021-03-21 NOTE — Progress Notes (Signed)
   03/21/21 1223  Assess: MEWS Score  Temp 98.4 F (36.9 C)  BP 123/71  Pulse Rate (!) 131  ECG Heart Rate (!) 132  Resp (!) 30  SpO2 92 %  O2 Device Room Air  Assess: MEWS Score  MEWS Temp 0  MEWS Systolic 0  MEWS Pulse 3  MEWS RR 2  MEWS LOC 0  MEWS Score 5  MEWS Score Color Red  Assess: if the MEWS score is Yellow or Red  Were vital signs taken at a resting state? Yes  Focused Assessment No change from prior assessment  Early Detection of Sepsis Score *See Row Information* Medium  MEWS guidelines implemented *See Row Information* Yes  Treat  MEWS Interventions Administered scheduled meds/treatments;Escalated (See documentation below)  Take Vital Signs  Increase Vital Sign Frequency  Red: Q 1hr X 4 then Q 4hr X 4, if remains red, continue Q 4hrs  Escalate  MEWS: Escalate Red: discuss with charge nurse/RN and provider, consider discussing with RRT  Notify: Charge Nurse/RN  Name of Charge Nurse/RN Notified Velia, RN  Date Charge Nurse/RN Notified 03/21/21  Time Charge Nurse/RN Notified 1400  Notify: Provider  Provider Name/Title Dr. Raymondo Band  Date Provider Notified 03/21/21  Time Provider Notified 1323  Notification Type Page (Secure chat)  Notification Reason Change in status  Provider response See new orders  Date of Provider Response 03/21/21  Time of Provider Response 1325  Document  Patient Outcome Stabilized after interventions  Progress note created (see row info) Yes

## 2021-03-21 NOTE — Progress Notes (Signed)
HD#6 SUBJECTIVE:  Patient Summary: [name] is a 80 year old female with Pmhx of myeloproliferative neoplasm, atrial flutter on Eliquis, HFpEF admitted for acute on chronic normocytic anemia in setting of GI bleed complicated by acute on chronic HFpEF exacerbation, acute on chronic renal failure and urinary tract infection.    Overnight Events: Patient became nauseous overnight and was given '4mg'$  zofran.  Interim History: This is hospital day 6 for Erika [name] who was seen and evaluated at the bedside this morning. She is much more awake and alert this morning, and was seen sitting up at the side of her bed. She did have 1 episode of emesis, however, her nausea improved after being given zofran and she reports feeling much better. She continues to deny any chest pain, palpitations, or shortness of breath. She had 4.7 L of urine output yesterday and symptomatically is feeling improved overall.   OBJECTIVE:  Vital Signs: Vitals:   03/31/2021 1833 03/07/2021 2000 03/07/2021 2112 03/25/2021 2356  BP: 97/83 (!) 114/57 114/60 108/62  Pulse: 80 82 83 76  Resp: '18 18 20 16  '$ Temp:   (!) 96.8 F (36 C) (!) 96.5 F (35.8 C)  TempSrc:   Oral Oral  SpO2: 99% 97% 100% 96%  Weight:      Height:       Supplemental O2: Room Air SpO2: 96%  Filed Weights   03/07/2021 1109 03/19/21 0431 03/21/2021 0400  Weight: 131 kg 133 kg 132.6 kg     Intake/Output Summary (Last 24 hours) at 03/21/2021 [id number] Last data filed at 03/21/2021 [id number] Gross per 24 hour  Intake 1720 ml  Output 4000 ml  Net -2280 ml   Net IO Since Admission: -3,599.14 mL [03/21/21 0558]  Physical Exam: Constitutional: pleasant, chronically ill-appearing female sitting at the side of the bed, no acute distress Cardiovascular: regular rate and rhythm, systolic murmur, no rubs or gallop noted; 2+ bilateral lower extremity edema to the knee, +JVD  Pulmonary/Chest: Normal effort on room air, diminished lung sounds at bilateral  bases Abdominal: soft, non-tender, non-distended, normal bowel sounds  Neurological: alert & oriented x 3 Skin: warm and dry     ASSESSMENT/PLAN:  Assessment: Principal Problem:   GI bleed Active Problems:   AF (paroxysmal atrial fibrillation) (HCC)   Myelodysplastic syndrome, unspecified (HCC)   Acute on chronic renal failure (HCC)   Acute on chronic heart failure with preserved ejection fraction (HFpEF) (HCC)   Chronic anticoagulation   Melena   Acute on chronic anemia   Gastritis and gastroduodenitis   Plan: #Acute on chronic macrocytic anemia  #GI bleed  Patient has received 4 units pRBC and 1 unit of platelets since admission. Hb stable at 78 this morning and she continues to deny any melanotic stools. Patient had video capsule endoscopy yesterday, with the results still pending at this time.  - GI consulted, appreciate recommendations   - Video capsule endoscopy results pending - Continue to trend CBC's, transfuse for Hb<7 - Continue Protonix '40mg'$  daily  - Continue to hold Eliquis for now  #Acute on chronic HFpEF exacerbation Patient remains volume overloaded on examination, with pitting edema of bilateral lower extremities, significant JVD, and depressed bibasilar lung sounds. Patient did receive two doses of IV lasix '80mg'$  yesterday with 4.7L UOP and a 4kg weight loss noted. Her metoprolol was initially held in the setting of her hypotension, however, it was restarted on 8/14 for tachycardia and improved blood pressures.  - IV Lasix '80mg'$  bid -  Metoprolol 12.'5mg'$  bid  - Continue cardiac monitoring - Strict I&Os and daily weights - Monitor volume status and trend BMP  #Sepsis 2/2 E.coli urinary tract infection Leukocytosis and hypotension are improving, and the patient continues to deny any urinary symptoms at this time. She was originally hypotensive on admission, however, her blood pressure has remained stable. - Continue IV Rocephin 1g daily, day 3 of antibiotics -  Trend CBC - Monitor vitals   #AKI on CKDIIIb Serum creatinine improved this AM 2.26>1.99 following diuresis yesterday with lasix '80mg'$  BID. Will continue with diuresis again today, as noted above.  - Continue to monitor renal function - Avoid nephrotoxic agents as able   #Myeloproliferative neoplasm Leukocytosis improved from admission, however, it is largely unchanged today. Still remains elevated beyond the patient's baseline at 154.  - Trend CBC's   Diet: Full Liquid IVF: None VTE: SCDs (holding off on therapeutic eliquis in the setting of GI bleed) Code: Full PT/OT recs: CIR, none.  Prior to Admission Living Arrangement: Home Anticipated Discharge Location: CIR Barriers to Discharge: Continued medical management  Dispo: Anticipated discharge to Rehab in 2-3 days pending clinical improvement.  Signature: Buddy Duty, D.O.  Internal Medicine Resident, PGY-1 [facility]  Pager: [phone] 5:58 AM, 03/21/2021   Please contact the on call pager after 5 pm and on weekends at [phone].

## 2021-03-21 NOTE — Consult Note (Signed)
[facility] ROUNDING NOTE  Subjective: 80 y/o fm with CHF, a-flutter, MDS and CKD admitted with melena and a drop in hgb.  EGD with gastritis, no ulcer or evidence of bleeding.  She has not had any melenic stool in the past 2 days.  She did have some nausea and vomiting overnight which improved with Zofran.  Complaining of some lower abdominal discomfort today.  She says she hasn't had a good bowel movement in a long time.  Dyspnea and leg swelling continuing to improve with diuresis.  Hgb stable at 8 today.   Objective: Vital signs in last 24 hours: Temp:  [96.5 F (35.8 C)-98.4 F (36.9 C)] 98.4 F (36.9 C) (08/16 1357) Pulse Rate:  [72-136] 136 (08/16 1357) Resp:  [16-30] 23 (08/16 1357) BP: (97-129)/(57-83) 129/80 (08/16 1357) SpO2:  [92 %-100 %] 97 % (08/16 1357) Weight:  [128.4 kg] 128.4 kg (08/16 0622) Last BM Date: 03/12/2021 General: NAD, chronically ill appearing obese Af Am fm Lungs: No increased work of breathing, decreased breath sounds at bases, faint crackles b/l, no wheezing Abdomen:  Soft, NT, ND, +BS Ext: 3+ pitting edema b/l    Intake/Output from previous day: 08/15 0701 - 08/16 0700 In: 1720 [P.O.:1620; IV Piggyback:100] Out: 4700 [Urine:4700] Intake/Output this shift: Total I/O In: 250 [P.O.:250] Out: -    Lab Results: Recent Labs    03/19/21 0613 03/16/2021 0643 03/21/21 0429  WBC 156.8* 147.4* 154.3*  HGB 7.2* 7.9* 8.0*  PLT 111* 109* 97*  MCV 95.4 93.8 95.0   BMET Recent Labs    03/19/21 0613 03/08/2021 0643 03/21/21 0429  NA 136 138 137  K 4.8 4.0 3.7  CL 105 105 101  CO2 20* 23 23  GLUCOSE 81 86 47*  BUN 91* 82* 76*  CREATININE 2.63* 2.26* 1.99*  CALCIUM 8.9 9.0 9.1   LFT Recent Labs    03/19/21 0613  PROT 6.1*  ALBUMIN 3.0*  AST 26  ALT 17  ALKPHOS 309*  BILITOT 1.4*   PT/INR No results for input(s): INR in the last 72 hours.    Imaging/Other results: No results found.    Assessment and Plan:  1)  Acute on chronic anemia 2) Melena 3) Gastritis - EGD on 03/11/2021 with some mild nonbleeding gastritis.  Biopsies pending - Stable H/H with no BM yesterday - Capsule endoscopy today was negative for any small bowel lesions.  Erosions/small ulcerations were noted in the stomach.  These are likely related to the biopsies that were taken during her EGD.  Capsule report pending - Given the absence of active bleeding and that she had a colonoscopy in mid June, a repeat diagnostic colonoscopy is not necessary at this time.  Should she demonstrate frank hematochezia, a repeat colonoscopy could be performed. - Continue CBC trend with blood products as needed per protocol - Continue Protonix 40 mg/day - Ok to resume Eliquis from GI standpoint. - Recommend trying Miralax or senna to help her move her bowels, as this may be contributing to her pain and nausea.  4) AKI on CKD 3 5) CHF 6) UTI - Management per primary Medicine service  7) Myeloproliferative disease - Chronic thrombocytopenia and anemia with significant leukocytosis - Management per primary service    Daryel November, DO  03/21/2021, 2:17 PM [facility]

## 2021-03-21 NOTE — Evaluation (Signed)
Occupational Therapy Evaluation Patient Details Name: Erika [name] MRN: [id number] DOB: 02-19-1941 Today's Date: 03/21/2021    History of Present Illness 80yF with CHF, atrial flutter, moderate AS, MDS here with acute blood loss anemia with melena. EGD 03/06/2021.   Clinical Impression   Pt admitted with the above diagnoses and presents with below problem list. Pt will benefit from continued acute OT to address the below listed deficits and maximize independence with basic ADLs prior to d/c to venue below. At baseline, pt is mod I with basic ADLs, uses rollator and extra time. Pt currently needs max A for LB ADLs in sit<>stand. Mod A +2 for safety to take pivotal steps to access recliner from [facility]. Min A with UB ADLs. Feel pt would benefit from intensive rehab program at time of d/c.      Follow Up Recommendations  CIR    Equipment Recommendations  Other (comment) (defer to next venue)    Recommendations for Other Services       Precautions / Restrictions Precautions Precautions: Fall Precaution Comments: monitor bp, hgb Restrictions Weight Bearing Restrictions: No      Mobility Bed Mobility               General bed mobility comments: sitting EOB at start of session. up in recliner at end of session.    Transfers Overall transfer level: Needs assistance Equipment used: 1 person hand held assist Transfers: Squat Pivot Transfers Sit to Stand: Mod assist;From elevated surface   Squat pivot transfers: Mod assist     General transfer comment: from significantly elevated EOB (pt requested). Steadying assist.Guarding provided at R knee as pt r/o 3 falls at home d/t leg weakness. EOB>BSC. BSC positioned at ~45% angle from side of bed (pt preference). Second transfer Scientist, physiological) completed at mildly elevated seat height. SPT using rw, +2 mod A for safety. Pt using L elbow on walker handhandle despite cueing; states she does it this way at home. Cues for safety to initiate  sitting prematurely.    Balance Overall balance assessment: Needs assistance Sitting-balance support: Feet supported;No upper extremity supported Sitting balance-Leahy Scale: Good     Standing balance support: Bilateral upper extremity supported Standing balance-Leahy Scale: Poor Standing balance comment: difficulty with dynamic standing balance                           ADL either performed or assessed with clinical judgement   ADL Overall ADL's : Needs assistance/impaired Eating/Feeding: Set up;Sitting   Grooming: Minimal assistance;Sitting   Upper Body Bathing: Minimal assistance;Sitting   Lower Body Bathing: Maximal assistance;Sit to/from stand   Upper Body Dressing : Minimal assistance;Sitting   Lower Body Dressing: Maximal assistance;Sit to/from stand   Toilet Transfer: Moderate assistance;Stand-pivot;+2 for safety/equipment   Toileting- Clothing Manipulation and Hygiene: Maximal assistance;Sitting/lateral lean;Sit to/from stand         General ADL Comments: Pt received sitting EOB. Initial transfer was squat pivot from significantly elevated seat height to BSC. Second transfer was from slightly elevated seat surface SPT to recliner. +2 for safety with pivotal steps. Cues for safety and technique with rw. Up in recliner at end of session. Encouraged functional SPTs with assist throughout the day     Vision         Perception     Praxis      Pertinent Vitals/Pain Pain Assessment: No/denies pain     Hand Dominance Right   Extremity/Trunk Assessment Upper  Extremity Assessment Upper Extremity Assessment: Generalized weakness   Lower Extremity Assessment Lower Extremity Assessment: Defer to PT evaluation       Communication Communication Communication: No difficulties   Cognition Arousal/Alertness: Awake/alert Behavior During Therapy: WFL for tasks assessed/performed Overall Cognitive Status: Within Functional Limits for tasks assessed                                      General Comments  HR low 100s throughout most of session    Exercises     Shoulder Instructions      Home Living Family/patient expects to be discharged to:: Private residence Living Arrangements: Children Available Help at Discharge: Family;Available 24 hours/day (states they are in process of getting an Aide however son stays with her - in poor health?) Type of Home: Apartment Home Access: Level entry     Home Layout: One level     Bathroom Shower/Tub: Teacher, early years/pre: [place] [facility] bed;Wheelchair - [employer] - 4 wheels;Bedside commode   Additional Comments: questionable assistance at home. States son had a stroke. another son comes by to cook food      Prior Functioning/Environment Level of Independence: Independent with assistive device(s)        Comments: used rollator to ambulate, states she can still drive. was bathing dressing and cooking PTA per pt report.        OT Problem List: Decreased strength;Decreased activity tolerance;Impaired balance (sitting and/or standing);Decreased knowledge of use of DME or AE;Decreased knowledge of precautions;Obesity      OT Treatment/Interventions: Self-care/ADL training;Therapeutic exercise;Energy conservation;DME and/or AE instruction;Therapeutic activities;Patient/family education;Balance training    OT Goals(Current goals can be found in the care plan section) Acute Rehab OT Goals Patient Stated Goal: Get well and go home OT Goal Formulation: With patient Time For Goal Achievement: 04/04/21 Potential to Achieve Goals: Good ADL Goals Pt Will Perform Lower Body Bathing: sit to/from stand;with supervision;with adaptive equipment Pt Will Perform Lower Body Dressing: with supervision;with adaptive equipment;sit to/from stand Pt Will Transfer to Toilet: with supervision;ambulating Pt Will Perform Toileting - Clothing  Manipulation and hygiene: with min guard assist;sitting/lateral leans;sit to/from stand Additional ADL Goal #1: Pt will  OT Frequency: Min 2X/week   Barriers to D/C:            Co-evaluation              AM-PAC OT "6 Clicks" Daily Activity     Outcome Measure Help from another person eating meals?: None Help from another person taking care of personal grooming?: A Little Help from another person toileting, which includes using toliet, bedpan, or urinal?: A Lot Help from another person bathing (including washing, rinsing, drying)?: A Lot Help from another person to put on and taking off regular upper body clothing?: A Little Help from another person to put on and taking off regular lower body clothing?: A Lot 6 Click Score: 16   End of Session Equipment Utilized During Treatment: Gait belt  Activity Tolerance: Patient tolerated treatment well Patient left:    OT Visit Diagnosis: Unsteadiness on feet (R26.81);Other abnormalities of gait and mobility (R26.89);Muscle weakness (generalized) (M62.81);History of falling (Z91.81)                Time: [id number] OT Time Calculation (min): 45 min Charges:  OT General Charges $OT Visit: 1 Visit OT Evaluation $OT Eval  Moderate Complexity: 1 Mod OT Treatments $Self Care/Home Management : 23-37 mins  Tyrone Schimke, OT Acute Rehabilitation Services Pager: [phone] Office: [phone]   Hortencia Pilar 03/21/2021, 10:34 AM

## 2021-03-22 ENCOUNTER — Inpatient Hospital Stay (HOSPITAL_COMMUNITY): Payer: PPO

## 2021-03-22 ENCOUNTER — Encounter (HOSPITAL_COMMUNITY): Payer: Self-pay | Admitting: Student in an Organized Health Care Education/Training Program

## 2021-03-22 DIAGNOSIS — D649 Anemia, unspecified: Secondary | ICD-10-CM

## 2021-03-22 DIAGNOSIS — D735 Infarction of spleen: Secondary | ICD-10-CM

## 2021-03-22 DIAGNOSIS — R161 Splenomegaly, not elsewhere classified: Secondary | ICD-10-CM

## 2021-03-22 DIAGNOSIS — R579 Shock, unspecified: Secondary | ICD-10-CM

## 2021-03-22 DIAGNOSIS — C9310 Chronic myelomonocytic leukemia not having achieved remission: Secondary | ICD-10-CM

## 2021-03-22 DIAGNOSIS — D72829 Elevated white blood cell count, unspecified: Secondary | ICD-10-CM

## 2021-03-22 DIAGNOSIS — R109 Unspecified abdominal pain: Secondary | ICD-10-CM

## 2021-03-22 LAB — CBC WITH DIFFERENTIAL/PLATELET
Abs Immature Granulocytes: 0 10*3/uL (ref 0.00–0.07)
Basophils Absolute: 0 10*3/uL (ref 0.0–0.1)
Basophils Relative: 0 %
Eosinophils Absolute: 0 10*3/uL (ref 0.0–0.5)
Eosinophils Relative: 0 %
HCT: 24.8 % — ABNORMAL LOW (ref 36.0–46.0)
Hemoglobin: 7.6 g/dL — ABNORMAL LOW (ref 12.0–15.0)
Lymphocytes Relative: 0 %
Lymphs Abs: 0 10*3/uL — ABNORMAL LOW (ref 0.7–4.0)
MCH: 30.2 pg (ref 26.0–34.0)
MCHC: 30.6 g/dL (ref 30.0–36.0)
MCV: 98.4 fL (ref 80.0–100.0)
Monocytes Absolute: 5.3 10*3/uL — ABNORMAL HIGH (ref 0.1–1.0)
Monocytes Relative: 2 %
Neutro Abs: 259.6 10*3/uL — ABNORMAL HIGH (ref 1.7–7.7)
Neutrophils Relative %: 98 %
Platelets: 114 10*3/uL — ABNORMAL LOW (ref 150–400)
RBC: 2.52 MIL/uL — ABNORMAL LOW (ref 3.87–5.11)
RDW: 19 % — ABNORMAL HIGH (ref 11.5–15.5)
WBC: 264.9 10*3/uL (ref 4.0–10.5)
nRBC: 0 % (ref 0.0–0.2)
nRBC: 0 /100 WBC

## 2021-03-22 LAB — POCT I-STAT 7, (LYTES, BLD GAS, ICA,H+H)
Acid-base deficit: 3 mmol/L — ABNORMAL HIGH (ref 0.0–2.0)
Bicarbonate: 24.1 mmol/L (ref 20.0–28.0)
Calcium, Ion: 1.25 mmol/L (ref 1.15–1.40)
HCT: 32 % — ABNORMAL LOW (ref 36.0–46.0)
Hemoglobin: 10.9 g/dL — ABNORMAL LOW (ref 12.0–15.0)
O2 Saturation: 86 %
Patient temperature: 97.9
Potassium: 4.4 mmol/L (ref 3.5–5.1)
Sodium: 135 mmol/L (ref 135–145)
TCO2: 26 mmol/L (ref 22–32)
pCO2 arterial: 52 mmHg — ABNORMAL HIGH (ref 32.0–48.0)
pH, Arterial: 7.271 — ABNORMAL LOW (ref 7.350–7.450)
pO2, Arterial: 58 mmHg — ABNORMAL LOW (ref 83.0–108.0)

## 2021-03-22 LAB — PROCALCITONIN: Procalcitonin: 8.97 ng/mL

## 2021-03-22 LAB — CBC
HCT: 25.7 % — ABNORMAL LOW (ref 36.0–46.0)
Hemoglobin: 8.2 g/dL — ABNORMAL LOW (ref 12.0–15.0)
MCH: 30.8 pg (ref 26.0–34.0)
MCHC: 31.9 g/dL (ref 30.0–36.0)
MCV: 96.6 fL (ref 80.0–100.0)
Platelets: 109 10*3/uL — ABNORMAL LOW (ref 150–400)
RBC: 2.66 MIL/uL — ABNORMAL LOW (ref 3.87–5.11)
RDW: 18.8 % — ABNORMAL HIGH (ref 11.5–15.5)
WBC: 204.4 10*3/uL (ref 4.0–10.5)
nRBC: 0 % (ref 0.0–0.2)

## 2021-03-22 LAB — BASIC METABOLIC PANEL
Anion gap: 13 (ref 5–15)
BUN: 77 mg/dL — ABNORMAL HIGH (ref 8–23)
CO2: 24 mmol/L (ref 22–32)
Calcium: 9 mg/dL (ref 8.9–10.3)
Chloride: 100 mmol/L (ref 98–111)
Creatinine, Ser: 2.06 mg/dL — ABNORMAL HIGH (ref 0.44–1.00)
GFR, Estimated: 24 mL/min — ABNORMAL LOW (ref >60)
Glucose, Bld: 38 mg/dL — CL (ref 70–99)
Potassium: 3.6 mmol/L (ref 3.5–5.1)
Sodium: 137 mmol/L (ref 135–145)

## 2021-03-22 LAB — GLUCOSE, CAPILLARY
Glucose-Capillary: 83 mg/dL (ref 70–99)
Glucose-Capillary: 79 mg/dL (ref 70–99)
Glucose-Capillary: 113 mg/dL — ABNORMAL HIGH (ref 70–99)
Glucose-Capillary: 78 mg/dL (ref 70–99)
Glucose-Capillary: 80 mg/dL (ref 70–99)
Glucose-Capillary: 81 mg/dL (ref 70–99)
Glucose-Capillary: 98 mg/dL (ref 70–99)

## 2021-03-22 LAB — URIC ACID: Uric Acid, Serum: 8.4 mg/dL — ABNORMAL HIGH (ref 2.5–7.1)

## 2021-03-22 LAB — LACTIC ACID, PLASMA
Lactic Acid, Venous: 3.3 mmol/L (ref 0.5–1.9)
Lactic Acid, Venous: 3.3 mmol/L (ref 0.5–1.9)

## 2021-03-22 LAB — LACTATE DEHYDROGENASE: LDH: 715 U/L — ABNORMAL HIGH (ref 98–192)

## 2021-03-22 LAB — TSH: TSH: 5.167 u[IU]/mL — ABNORMAL HIGH (ref 0.350–4.500)

## 2021-03-22 LAB — MRSA NEXT GEN BY PCR, NASAL: MRSA by PCR Next Gen: NOT DETECTED

## 2021-03-22 LAB — CORTISOL: Cortisol, Plasma: 32.9 ug/dL

## 2021-03-22 MED ORDER — NOREPINEPHRINE 16 MG/250ML-% IV SOLN
0.0000 ug/min | INTRAVENOUS | Status: DC
Start: 2021-03-22 — End: 2021-03-24
  Administered 2021-03-22: 24 ug/min via INTRAVENOUS
  Administered 2021-03-23 (×3): 50 ug/min via INTRAVENOUS
  Administered 2021-03-24: 32 ug/min via INTRAVENOUS
  Administered 2021-03-24: 48 ug/min via INTRAVENOUS
  Filled 2021-03-22 (×6): qty 250

## 2021-03-22 MED ORDER — SODIUM CHLORIDE 0.9 % IV BOLUS: 1000.0000 mL | Freq: Once | INTRAVENOUS | Status: DC

## 2021-03-22 MED ORDER — SODIUM CHLORIDE 0.9 % IV SOLN: 6.0000 mg | Freq: Once | INTRAVENOUS | Status: DC

## 2021-03-22 MED ORDER — IOHEXOL 350 MG/ML SOLN
75.0000 mL | Freq: Once | INTRAVENOUS | Status: AC | PRN
  Administered 2021-03-22: 75 mL via INTRAVENOUS

## 2021-03-22 MED ORDER — DEXTROSE 50 % IV SOLN
INTRAVENOUS | Status: AC
  Administered 2021-03-22: 25 mL
  Filled 2021-03-22: qty 50

## 2021-03-22 MED ORDER — METRONIDAZOLE 500 MG/100ML IV SOLN
500.0000 mg | Freq: Three times a day (TID) | INTRAVENOUS | Status: DC
  Administered 2021-03-23 – 2021-03-24 (×5): 500 mg via INTRAVENOUS
  Filled 2021-03-22 (×5): qty 100

## 2021-03-22 MED ORDER — SODIUM CHLORIDE 0.9 % IV SOLN
250.0000 mL | INTRAVENOUS | Status: DC
  Administered 2021-03-22: 250 mL via INTRAVENOUS

## 2021-03-22 MED ORDER — NOREPINEPHRINE 4 MG/250ML-% IV SOLN
0.0000 ug/min | INTRAVENOUS | Status: DC
Start: 2021-03-22 — End: 2021-03-22
  Administered 2021-03-22: 24 ug/min via INTRAVENOUS
  Administered 2021-03-22: 2 ug/min via INTRAVENOUS
  Filled 2021-03-22 (×2): qty 250

## 2021-03-22 MED ORDER — SODIUM CHLORIDE 0.9% FLUSH
10.0000 mL | Freq: Two times a day (BID) | INTRAVENOUS | Status: DC
  Administered 2021-03-22 – 2021-03-23 (×2): 10 mL

## 2021-03-22 MED ORDER — LACTATED RINGERS IV BOLUS
250.0000 mL | Freq: Once | INTRAVENOUS | Status: AC
  Administered 2021-03-22: 250 mL via INTRAVENOUS

## 2021-03-22 MED ORDER — SODIUM CHLORIDE 0.9% FLUSH: 10.0000 mL | INTRAVENOUS | Status: DC | PRN

## 2021-03-22 MED ORDER — HEPARIN (PORCINE) 25000 UT/250ML-% IV SOLN
1250.0000 [IU]/h | INTRAVENOUS | Status: DC
  Administered 2021-03-22: 1450 [IU]/h via INTRAVENOUS
  Administered 2021-03-23: 1250 [IU]/h via INTRAVENOUS
  Filled 2021-03-22 (×2): qty 250

## 2021-03-22 MED ORDER — SODIUM CHLORIDE 0.9 % IV BOLUS
250.0000 mL | Freq: Once | INTRAVENOUS | Status: AC
  Administered 2021-03-22: 250 mL via INTRAVENOUS

## 2021-03-22 MED ORDER — METOPROLOL TARTRATE 5 MG/5ML IV SOLN
2.5000 mg | Freq: Four times a day (QID) | INTRAVENOUS | Status: DC
  Administered 2021-03-22: 2.5 mg via INTRAVENOUS
  Filled 2021-03-22: qty 5

## 2021-03-22 MED ORDER — SODIUM CHLORIDE 0.9 % IV SOLN
2.0000 g | Freq: Two times a day (BID) | INTRAVENOUS | Status: DC
  Administered 2021-03-22 – 2021-03-23 (×2): 2 g via INTRAVENOUS
  Filled 2021-03-22 (×2): qty 2

## 2021-03-22 MED ORDER — MORPHINE SULFATE (PF) 2 MG/ML IV SOLN
2.0000 mg | Freq: Once | INTRAVENOUS | Status: AC
  Administered 2021-03-22: 2 mg via INTRAVENOUS
  Filled 2021-03-22: qty 1

## 2021-03-22 MED ORDER — IOHEXOL 9 MG/ML PO SOLN
ORAL | Status: AC
  Administered 2021-03-22: 500 mL
  Filled 2021-03-22: qty 1000

## 2021-03-22 MED ORDER — METOPROLOL TARTRATE 12.5 MG HALF TABLET: 12.5000 mg | ORAL_TABLET | Freq: Two times a day (BID) | ORAL | Status: DC

## 2021-03-22 MED ORDER — CHLORHEXIDINE GLUCONATE CLOTH 2 % EX PADS
6.0000 | MEDICATED_PAD | Freq: Every day | CUTANEOUS | Status: DC
Start: 2021-03-22 — End: 2021-03-24
  Administered 2021-03-22 – 2021-03-24 (×3): 6 via TOPICAL

## 2021-03-22 MED ORDER — LACTATED RINGERS IV BOLUS
500.0000 mL | Freq: Once | INTRAVENOUS | Status: AC
  Administered 2021-03-22: 500 mL via INTRAVENOUS

## 2021-03-22 NOTE — Progress Notes (Signed)
ANTICOAGULATION CONSULT NOTE - Initial Consult  Pharmacy Consult for heparin Indication: Splenic vein thrombosis 03/22/21  Allergies  Allergen Reactions   Prednisone Other (See Comments)    REACTION: Psychosis, talking out of head, insomnia   Seroquel [Quetiapine Fumarate] Nausea Only and Other (See Comments)    Caused very dry mouth    Patient Measurements: Height: '5\' 7"'$  (170.2 cm) Weight: 125.3 kg (276 lb 3.8 oz) IBW/kg (Calculated) : 61.6 Heparin Dosing Weight: 96kg  Vital Signs: Temp: 96.2 F (35.7 C) (08/17 1714) Temp Source: Axillary (08/17 1714) BP: 98/49 (08/17 1730) Pulse Rate: 139 (08/17 1800)  Labs: Recent Labs    03/30/2021 0643 03/21/21 0429 03/22/21 0226 03/22/21 1550 03/22/21 1618  HGB 7.9* 8.0* 8.2* 7.6* 10.9*  HCT 24.2* 24.9* 25.7* 24.8* 32.0*  PLT 109* 97* 109* 114*  --   CREATININE 2.26* 1.99* 2.06*  --   --     Estimated Creatinine Clearance: 30.9 mL/min (A) (by C-G formula based on SCr of 2.06 mg/dL (H)).   Medical History: Past Medical History:  Diagnosis Date   Anemia    with menses   Asthma    Atrial flutter, paroxysmal ([place])    Back pain    status post surgery 2002   Blood transfusion without reported diagnosis    Breast cyst    Excesion with FNA, begnin in 2004.    CHF (congestive heart failure) (HCC)    Degenerative joint disease of spine    Imaging 2005,  Degenerative hypertrophic facet arthritis changes L4-5 and L5-S1.[name]    History of shingles    Recurrent with post herpetic neuralgia.    Hypertension    Lymphadenopathy    Of the mediastinum, Right side CXR 2008, not read on 2010 cxr.    Menopause    Myeloproliferative neoplasm ([place])    Obesity    BMI 54   Psychosis ([place])    Secondary to prednisone.   Shingles    Stasis dermatitis    W/ LE edema, prviously on lasix now on mazxide.    SVT (supraventricular tachycardia) ([place]) 01/2008   one run while hospitalized   Tuberculosis    active TB treated in 2002, hx of  paraspinal lumbar TB,      Assessment: 80 yo W with acute splenic vein thrombosis 03/22/21. On apixaban for afib/flutter prior to admission, which was held 8/10-8/15 for anemia and hematochezia, restarted on 8/16, last dose 8/17 at 9 AM.  Pharmacy consulted for heparin.    H/H low stable, s/p transfusions 8/12 and 8/14. Plt stable.   Goal of Therapy:  Heparin level 0.3-0.7 units/ml Monitor platelets by anticoagulation protocol: Yes   Plan:  Heparin 1450 units/hr, no bolus F/u APTT in 8 hr  F/u aPTT until correlates with heparin level  Monitor daily aPTT, HL, CBC/plt Monitor for signs/symptoms of bleeding    Benetta Spar, PharmD, BCPS, BCCP Clinical Pharmacist  Please check AMION for all [place] phone numbers After 10:00 PM, call [place]

## 2021-03-22 NOTE — Progress Notes (Signed)
ABG results given to Dr Erin Fulling who is at bedside. Orders were given to place pt on BIPAP when pt arrives to ICU. O2 was increased to 13 L.

## 2021-03-22 NOTE — Progress Notes (Signed)
Inpatient Rehabilitation Admissions Coordinator   I await medical workup completion and medical readiness to pursue a possible Cir admit with [facility] [employer]. Noted HR issues today. I will follow.  Danne Baxter, RN, MSN Rehab Admissions Coordinator [phone] 03/22/2021 12:07 PM

## 2021-03-22 NOTE — Progress Notes (Signed)
HD#7 SUBJECTIVE:  Patient Summary: [name] is a 80 year old female with Pmhx of myeloproliferative neoplasm, atrial flutter on Eliquis, HFpEF admitted for acute on chronic normocytic anemia in setting of GI bleed complicated by acute on chronic HFpEF exacerbation, acute on chronic renal failure and urinary tract infection.   Overnight Events: No acute events overnight  Interim History: This is hospital day 7 for [name] who was seen and evaluated at the bedside this morning. Her heart rate was in the 130s on the monitor, with systolic BP in the 0000000. The patient was more short of breath and wheezing on evaluation. She reports that she has abdominal pain, right above her umbilicus, and it feels like she has to have a bowel movement.    OBJECTIVE:  Vital Signs: Vitals:   03/21/21 2000 03/21/21 2100 03/21/21 2341 03/22/21 0419  BP: (!) 106/92 (!) 91/48 99/71 (!) 99/52  Pulse:   92 84  Resp: '19  18 20  '$ Temp:   97.8 F (36.6 C) (!) 97.4 F (36.3 C)  TempSrc:   Oral Oral  SpO2: 97%  97% 100%  Weight:    125.3 kg  Height:       Supplemental O2: Nasal Cannula SpO2: 100 % O2 Flow Rate (L/min): 1 L/min  Filed Weights   04/04/2021 0400 03/21/21 0622 03/22/21 0419  Weight: 132.6 kg 128.4 kg 125.3 kg     Intake/Output Summary (Last 24 hours) at 03/22/2021 [id number] Last data filed at 03/22/2021 [id number] Gross per 24 hour  Intake 810 ml  Output 1150 ml  Net -340 ml   Net IO Since Admission: -4,639.14 mL [03/22/21 0653]  Physical Exam: Constitutional: pleasant, chronically ill-appearing female sitting at the side of the bed, no acute distress Cardiovascular: regular rate and rhythm, systolic murmur, no rubs or gallop noted; 2+ bilateral lower extremity edema to the knee, +JVD  Pulmonary/Chest: Normal effort on room air, diminished lung sounds at bilateral bases, diffuse wheezing Abdominal: soft, tenderness in epigastric region right above umbilicus, distended mass above umbilicus  likely hernia? normal bowel sounds  Neurological: alert & oriented x 3 Skin: warm and dry      ASSESSMENT/PLAN:  Assessment: Principal Problem:   GI bleed Active Problems:   AF (paroxysmal atrial fibrillation) (HCC)   Myelodysplastic syndrome, unspecified (HCC)   AKI (acute kidney injury) (HCC)   Acute on chronic heart failure with preserved ejection fraction (HFpEF) (HCC)   Chronic anticoagulation   Melena   Acute on chronic anemia   Gastritis and gastroduodenitis   Plan: #Acute on chronic macrocytic anemia  #GI bleed  Patient has received 4 units pRBC and 1 unit of platelets since admission. Hb stable at 8.2 this morning and she continues to deny any melanotic stools. Video capsule endoscopy was negative for any small bowel lesions, however, erosions and small ulcerations were noted in the stomach. Repeat colonoscopy not necessary at this time. Patient noted to have a mass/hernia above her umbilicus today on exam, which is causing the patient significant pain. - GI consulted, appreciate recommendations  - CT abd/pelvis pending - Morphine '2mg'$  for pain management - Continue to trend CBC's, transfuse for Hb<7 - Continue Protonix '40mg'$  daily  - Eliquis resumed  #Acute on chronic HFpEF exacerbation Patient remains volume overloaded on examination, with pitting edema of bilateral lower extremities. Patient did receive two doses of IV lasix '80mg'$  yesterday with a 3kg weight loss noted in the last day. Holding off on diuresis today as  the patient is hypotensive. Will give back 500cc of fluids. Metoprolol dose was increased to '25mg'$  bid on 8/16, however, the patient is hypotensive and we will reduce this dose. - Metoprolol 12.'5mg'$  bid  - Continue cardiac monitoring - Strict I&Os and daily weights - Monitor volume status and trend BMP  #Sepsis 2/2 E.coli urinary tract infection Patient continues to deny any urinary symptoms at this time.  - Continue IV Rocephin 1g daily, day 4 of  antibiotics - Trend CBC - Monitor vitals   #AKI on CKDIIIb Serum creatinine 2.06 today following diuresis yesterday with lasix '80mg'$  BID. Will hold off on diuresis in the setting of hypotension. - Continue to monitor renal function - Avoid nephrotoxic agents as able   #Myeloproliferative neoplasm Leukocytosis markedly increased today, >200. Unsure if secondary to myeloproliferative neoplasm vs infection. CT abd/pelvis pending.  - Trend CBC's   Diet: Full Liquid IVF: None VTE: Therapeutic eliquis  Code: Full PT/OT recs: CIR, none.  Prior to Admission Living Arrangement: Home Anticipated Discharge Location: CIR Barriers to Discharge: Continued medical management  Dispo: Anticipated discharge to Rehab in 2-3 days pending clinical improvement.  Signature: Buddy Duty, D.O.  Internal Medicine Resident, PGY-1 [facility]  Pager: [phone] 6:53 AM, 03/22/2021   Please contact the on call pager after 5 pm and on weekends at [phone].

## 2021-03-22 NOTE — Consult Note (Signed)
NAME:  Erika [name], MRN:  [id number], DOB:  1940/11/14, LOS: 6 ADMISSION DATE:  03/14/2021, CONSULTATION DATE:  8/17 REFERRING MD:  Dr. Daryll Drown, CHIEF COMPLAINT:  AMS, Hypotension, Leukocytosis    History of Present Illness:  80 y/o F who presented to [facility] ER on 8/10 with reports of weakness, LE swelling and bloody stool.    She recently had a polypectomy January 27, 2021 and has been on chronic anticoagulation.  After that she noted blood in her stools.  She reported bleeding for one month prior to admit. She reported difficulty walking on admit, decline in her ability to perform self care, shortness of breath.  Initial work up was concerning for possible GIB with hematochezia.  FOBT positive. On admit, she was noted to have had a 20 lb increase over the last 3 months and she was diuresed.  She had AKI and was diuresed.  UA was concerning for possible UTI.  Renal US was performed & consistent with medical renal disease.  Hgb on presentation was 6.3 requiring transfusion. She was evaluated by GI, anticoagulation was held, PPI IV and serial H/H.  She was pan cultured with blood cultures showing staph epi and GNR.  Urine culture grew out pan-sensitive E-Coli.  She was treated accordingly. She underwent EGD on 8/13 with findings of a normal esophagus, non-ulcerated / non-bleeding gastritis with mild oozing from biopsy sites.  Capsule endoscopy was started on 8/15 with no acute bleeding.  She developed a palpable bulge in her abdomen on 8/17, hypotension and AMS. CT of the abdomen without contrast showed interval marked enlargement of the spleen with foci of parenchymal gas, moderate volume of abdominopelvic ascites.  PCCM called 8/17 for evaluation for hypotension.  She was given IVF for hypotension, glucose for low glucose reading on BMP's. Levophed was added for persistent hypotension.  She was evaluated by General Surgery and felt not to have acute surgical needs.  Follow up imaging of the abdomen with  contrast showed persistent enlarged spleen with patchy areas of enhancement, small foci of air and changes consistent with splenic vein thrombosis extending from the splenic hilum to within 3 cm of the spleno portal confluence.  This was reviewed again with surgery and no acute current surgical needs.   Pertinent  Medical History  Myeloproliferative Neoplasm  Atrial Flutter - on Eliquis HFpEF  Anemia  CKD IIIb Paraspinal Lumbar TB  Significant Hospital Events: Including procedures, antibiotic start and stop dates in addition to other pertinent events   8/10 Admit  8/17 PCCM consulted for hypotension, AMS, anemia   Interim History / Subjective:  Pt denies pain, SOB, chest pain, n/v  Objective   Blood pressure (!) 76/35, pulse (!) 136, temperature (!) 97.5 F (36.4 C), temperature source Oral, resp. rate (!) 21, height '5\' 7"'$  (1.702 m), weight 125.3 kg, SpO2 96 %.        Intake/Output Summary (Last 24 hours) at 03/22/2021 1631 Last data filed at 03/22/2021 0829 Gross per 24 hour  Intake 340 ml  Output 150 ml  Net 190 ml   Filed Weights   03/14/2021 0400 03/21/21 0622 03/22/21 0419  Weight: 132.6 kg 128.4 kg 125.3 kg    Examination: General: pleasant elderly female lying in bed in NAD HENT: MM pink/moist, missing teeth, anicteric  Lungs: non-labored at rest, lungs bilaterally diminished but good air movement  Cardiovascular: s1s2 RRR, ST on monitor Abdomen: soft, non-tender, protuberant, bsx4 hypoactive Extremities: warm, dry, BLE edema  Neuro: drowsy, awakens  to voice, speech   Resolved Hospital Problem list     Assessment & Plan:   Profound Leukocytosis / Myeloproliferative Disorder  Anemia  -follow CBC  -ONC consulted > no acute treatment recommended at this time.  Suspect reactive process.  -assess uric acid   Enlarged Spleen with Gas on CT  Splenic Thrombosis  -reviewed with CCS, recommending anticoagulation  -heparin per pharmacy   Multifactorial Shock -  hypovolemic + septic  E-Coli UTI  -escalate abx to cefepime + flagyl  -repeat blood cultures now -assess lactic acid, PCT, cortisol, TSH   Acute Hypercarbic Respiratory Failure  Suspect in setting of obesity, acute illness. Baseline CO2 normal on BMP -now BiPAP  -follow intermittent CXR   Hypoglycemia vs Pseudo Hypoglycemia  Unusual that BMP read's low and peripheral check normal  -1/2 amp dextrose now  -follow glucose from central line or aline to ensure accuracy   AKI on CKD IIIb -Trend BMP / urinary output -Replace electrolytes as indicated -Avoid nephrotoxic agents, ensure adequate renal perfusion  Acute Metabolic Encephalopathy  -follow neuro exam   Best Practice (right click and "Reselect all SmartList Selections" daily)  Diet/type: NPO DVT prophylaxis: systemic heparin GI prophylaxis: PPI Lines: Central line Foley:  Yes, and it is still needed Code Status:  full code Last date of multidisciplinary goals of care discussion: pending  Labs   CBC: Recent Labs  Lab 03/16/2021 2233 03/16/21 1119 03/16/21 1903 03/17/21 2028 03/19/21 0613 04/05/2021 0643 03/21/21 0429 03/22/21 0226 03/22/21 1618  WBC 172.8* 158.0*   < > 178.8* 156.8* 147.4* 154.3* 204.4*  --   NEUTROABS 153.8* 151.7*  --   --   --   --   --   --   --   HGB 6.3* 7.3*   < > 7.7*  7.7* 7.2* 7.9* 8.0* 8.2* 10.9*  HCT 19.2* 22.7*   < > 25.4*  24.2* 22.8* 24.2* 24.9* 25.7* 32.0*  MCV 96.0 94.2   < > 100.8* 95.4 93.8 95.0 96.6  --   PLT 142* 129*   < > 119* 111* 109* 97* 109*  --    < > = values in this interval not displayed.    Basic Metabolic Panel: Recent Labs  Lab 03/16/21 1000 03/16/21 1119 03/17/21 0217 03/17/21 1618 03/26/2021 0441 03/19/21 [id number] 03/11/2021 [id number] 03/21/21 0429 03/22/21 0226 03/22/21 1618  NA  --    < > 138   < > 137 136 138 137 137 135  K  --    < > 5.3*   < > 4.3 4.8 4.0 3.7 3.6 4.4  CL  --    < > 107   < > 106 105 105 101 100  --   CO2  --    < > 17*   < > 17* 20* '23  23 24  '$ --   GLUCOSE  --    < > 39*   < > 39* 81 86 47* 38*  --   BUN  --    < > 102*   < > 99* 91* 82* 76* 77*  --   CREATININE  --    < > 3.19*   < > 3.00* 2.63* 2.26* 1.99* 2.06*  --   CALCIUM  --    < > 8.9   < > 8.8* 8.9 9.0 9.1 9.0  --   MG  --   --  2.1  --   --   --   --   --   --   --  PHOS 5.9*  --  5.9*  --   --   --   --   --   --   --    < > = values in this interval not displayed.   GFR: Estimated Creatinine Clearance: 30.9 mL/min (A) (by C-G formula based on SCr of 2.06 mg/dL (H)). Recent Labs  Lab 03/17/21 0838 03/17/21 1530 03/17/21 1618 03/17/21 2028 03/19/21 0613 03/19/2021 0643 03/21/21 0429 03/22/21 0226  WBC  --    < >  --    < > 156.8* 147.4* 154.3* 204.4*  LATICACIDVEN 2.7*  --  2.9*  --   --   --   --   --    < > = values in this interval not displayed.    Liver Function Tests: Recent Labs  Lab 03/12/2021 2233 03/16/21 1119 03/17/21 0838 03/19/21 0613  AST '27 25 26 26  '$ ALT '18 18 18 17  '$ ALKPHOS 354* 316* 342* 309*  BILITOT 1.1 1.1 0.7 1.4*  PROT 6.5 6.4* 6.3* 6.1*  ALBUMIN 3.2* 3.2* 3.1* 3.0*   No results for input(s): LIPASE, AMYLASE in the last 168 hours. No results for input(s): AMMONIA in the last 168 hours.  ABG    Component Value Date/Time   PHART 7.271 (L) 03/22/2021 1618   PCO2ART 52.0 (H) 03/22/2021 1618   PO2ART 58 (L) 03/22/2021 1618   HCO3 24.1 03/22/2021 1618   TCO2 26 03/22/2021 1618   ACIDBASEDEF 3.0 (H) 03/22/2021 1618   O2SAT 86.0 03/22/2021 1618     Coagulation Profile: Recent Labs  Lab 03/06/2021 2233 03/17/21 1530 03/12/2021 0832  INR 2.9* 2.0* 1.8*    Cardiac Enzymes: No results for input(s): CKTOTAL, CKMB, CKMBINDEX, TROPONINI in the last 168 hours.  HbA1C: Hgb A1c MFr Bld  Date/Time Value Ref Range Status  02/01/2012 08:14 PM 5.5 <5.7 % Final    Comment:    (NOTE)                                                                       According to the ADA Clinical Practice Recommendations for 2011,  when HbA1c is used as a screening test:  >=6.5%   Diagnostic of Diabetes Mellitus           (if abnormal result is confirmed) 5.7-6.4%   Increased risk of developing Diabetes Mellitus References:Diagnosis and Classification of Diabetes Mellitus,Diabetes [id number] 1):S62-S69 and Standards of Medical Care in         Diabetes - 2011,Diabetes [id number] (Suppl 1):S11-S61.    CBG: Recent Labs  Lab 03/21/21 2055 03/22/21 0411 03/22/21 0610 03/22/21 1055 03/22/21 1459  GLUCAP 83 83 79 113* 78    Review of Systems: Positives in bold  Gen: Denies fever, chills, weight change, fatigue, night sweats HEENT: Denies blurred vision, double vision, hearing loss, tinnitus, sinus congestion, rhinorrhea, sore throat, neck stiffness, dysphagia PULM: Denies shortness of breath, cough, sputum production, hemoptysis, wheezing CV: Denies chest pain, edema, orthopnea, paroxysmal nocturnal dyspnea, palpitations GI: Denies abdominal pain, nausea, vomiting, diarrhea, hematochezia, melena, constipation, change in bowel habits GU: Denies dysuria, hematuria, polyuria, oliguria, urethral discharge Endocrine: Denies hot or cold intolerance, polyuria, polyphagia or appetite change Derm: Denies rash, dry skin, scaling or peeling skin change  Heme: Denies easy bruising, bleeding, bleeding gums Neuro: Denies headache, numbness, weakness, slurred speech, loss of memory or consciousness  Past Medical History:  She,  has a past medical history of Anemia, Asthma, Atrial flutter, paroxysmal ([place]), Back pain, Blood transfusion without reported diagnosis, Breast cyst, CHF (congestive heart failure) ([place]), Degenerative joint disease of spine, History of shingles, Hypertension, Lymphadenopathy, Menopause, Obesity, Psychosis ([place]), Shingles, Stasis dermatitis, SVT (supraventricular tachycardia) ([place]) (01/2008), and Tuberculosis.   Surgical History:   Past Surgical History:  Procedure Laterality Date   BIOPSY   03/29/2021   Procedure: BIOPSY;  Surgeon: Lavena Bullion, DO;  Location: [facility];  Service: Gastroenterology;;   BREAST CYST ASPIRATION Left    Breast cyst biopsy     CHOLECYSTECTOMY  02/02/2012   Procedure: LAPAROSCOPIC CHOLECYSTECTOMY;  Surgeon: Zenovia Jarred, MD;  Location: [place];  Service: General;  Laterality: N/A;   ESOPHAGOGASTRODUODENOSCOPY (EGD) WITH PROPOFOL N/A 03/08/2021   Procedure: ESOPHAGOGASTRODUODENOSCOPY (EGD) WITH PROPOFOL;  Surgeon: Lavena Bullion, DO;  Location: [place];  Service: Gastroenterology;  Laterality: N/A;   GIVENS CAPSULE STUDY N/A 03/14/2021   Procedure: GIVENS CAPSULE STUDY;  Surgeon: Lavena Bullion, DO;  Location: [place];  Service: Gastroenterology;  Laterality: N/A;   Hemilaminectomy of L4, L5 and S1 decompression of tumor in epidural space  2000   IR FLUORO GUIDED NEEDLE PLC ASPIRATION/INJECTION LOC  07/17/2019     Social History:   reports that she has never smoked. She has never used smokeless tobacco. She reports that she does not drink alcohol and does not use drugs.   Family History:  Her family history includes Breast cancer in an other family member; Cancer in her sister; Diabetes in her brother; Heart attack in her father; Heart disease in her father and mother; Sickle cell anemia in her brother; Stroke in her brother, brother, brother, mother, and son. There is no history of Colon cancer, Esophageal cancer, Pancreatic cancer, Stomach cancer, Liver disease, or Rectal cancer.   Allergies Allergies  Allergen Reactions   Prednisone Other (See Comments)    REACTION: Psychosis, talking out of head, insomnia   Seroquel [Quetiapine Fumarate] Nausea Only and Other (See Comments)    Caused very dry mouth     Home Medications  Prior to Admission medications   Medication Sig Start Date End Date Taking? Authorizing Provider  ADVAIR DISKUS 500-50 MCG/DOSE AEPB Inhale 1 puff into the lungs 2 (two) times daily. 10/07/20  Yes  [name], MD  albuterol (VENTOLIN HFA) 108 (90 Base) MCG/ACT inhaler INHALE 1-2 PUFFS INTO THE LUNGS EVERY 6 HOURS AS NEEDED FOR WHEEZING OR FOR SHORTNESS OF BREATH 09/01/20  Yes [name], MD  allopurinol (ZYLOPRIM) 300 MG tablet Take 0.5 tablets (150 mg total) by mouth 2 (two) times daily. 12/07/20  Yes [name], MD  amLODipine (NORVASC) 10 MG tablet Take 0.5 tablets (5 mg total) by mouth daily. 10/07/20  Yes [name], MD  apixaban (ELIQUIS) 5 MG TABS tablet Take 1 tablet (5 mg total) by mouth 2 (two) times daily. 10/07/20  Yes [name], MD  aspirin EC 81 MG tablet Take 1 tablet (81 mg total) by mouth daily. 11/03/20  Yes [name], MD  Ensure Max Protein (ENSURE MAX PROTEIN) LIQD Take 330 mLs (11 oz total) by mouth 2 (two) times daily. Patient taking differently: Take 11 oz by mouth 2 (two) times daily. She drinks regular Ensure not Ensure Max and she prefers chocolate  08/04/19  Yes Danford, Suann Larry, MD  fluticasone (FLONASE) 50 MCG/ACT nasal spray SHAKE LIQUID AND INSTILL ONE (1) SPRAY IN EACH NOSTRIL ONCE DAILY Patient taking differently: Place 1 spray into both nostrils daily as needed for allergies. 01/30/21  Yes [name], MD  furosemide (LASIX) 40 MG tablet Take 1 tablet (40 mg total) by mouth daily. 01/26/21 04/26/21 Yes Rehman, Areeg N, DO  metoprolol tartrate (LOPRESSOR) 50 MG tablet TAKE 1 TABLET BY MOUTH TWICE DAILY 09/01/20  Yes [name], MD  [facility] powder APPLY TOPICALLY TO AFFECTED AREAS TWICE DAILY Patient taking differently: Apply 1 application topically 2 (two) times daily. 09/01/20  Yes [name], MD  omeprazole (PRILOSEC) 20 MG capsule TAKE 1 CAPSULE BY MOUTH DAILY 01/30/21  Yes [name], MD  potassium chloride SA (KLOR-CON) 20 MEQ tablet Take 0.5 tablets (10 mEq total) by mouth daily. 01/26/21  Yes Rehman, Areeg N, DO  pravastatin (PRAVACHOL) 20 MG tablet Take 1 tablet (20 mg total) by mouth daily. 10/07/20  Yes [name], MD  sodium chloride (OCEAN) 0.65 % SOLN nasal spray Place 1 spray into both nostrils as needed for congestion. 09/29/19  Yes [name], MD     Critical care time:  76 minutes     Noe Gens, MSN, APRN, NP-C, AGACNP-BC [facility] 03/22/2021, 4:31 PM   Please see [email or web address] for pager details.   From 7A-7P if no response, please call [phone] After hours, please call ELink [phone]

## 2021-03-22 NOTE — Discharge Instructions (Signed)

## 2021-03-22 NOTE — Consult Note (Signed)
Reason for the request:   Leukocytosis  HPI: I was asked by Dr. Erin Fulling to evaluate Erika [name] for elevated white cell count. She is a 80 year old woman followed by Dr. Irene Limbo for myeloproliferative disorder hospitalized on March 16, 2021 for worsening anemia and question of old GI bleed.     She was noted to have leukocytosis with predominantly neutrophilia with peripheral smear showed no evidence of schistocytes and predominantly mature neutrophils and monocytosis which is consistent with CMML rather than acute leukemia.     Laboratory data on March 22, 2021 showed a white cell count of 269, hemoglobin of 7.6 and platelet count of 114.  Differential showed predominantly neutrophils and monocytosis which is consistent with CMML/myeloproliferative disorder rather than an acute leukemic process.  Clinically, she is required pressors to increase her blood pressure and remains lethargic.  He is responsive and does complain of abdominal pain on elevation.    Past Medical History:  Diagnosis Date   Anemia    with menses   Asthma    Atrial flutter, paroxysmal ([place])    Back pain    status post surgery 2002   Blood transfusion without reported diagnosis    Breast cyst    Excesion with FNA, begnin in 2004.    CHF (congestive heart failure) (HCC)    Degenerative joint disease of spine    Imaging 2005,  Degenerative hypertrophic facet arthritis changes L4-5 and L5-S1.[name]    History of shingles    Recurrent with post herpetic neuralgia.    Hypertension    Lymphadenopathy    Of the mediastinum, Right side CXR 2008, not read on 2010 cxr.    Menopause    Myeloproliferative neoplasm ([place])    Obesity    BMI 54   Psychosis ([place])    Secondary to prednisone.   Shingles    Stasis dermatitis    W/ LE edema, prviously on lasix now on mazxide.    SVT (supraventricular tachycardia) ([place]) 01/2008   one run while hospitalized   Tuberculosis    active TB treated in 2002, hx of paraspinal lumbar TB,    :   Past Surgical History:  Procedure Laterality Date   BIOPSY  03/14/2021   Procedure: BIOPSY;  Surgeon: Lavena Bullion, DO;  Location: [facility];  Service: Gastroenterology;;   BREAST CYST ASPIRATION Left    Breast cyst biopsy     CHOLECYSTECTOMY  02/02/2012   Procedure: LAPAROSCOPIC CHOLECYSTECTOMY;  Surgeon: Zenovia Jarred, MD;  Location: [facility];  Service: General;  Laterality: N/A;   ESOPHAGOGASTRODUODENOSCOPY (EGD) WITH PROPOFOL N/A 04/01/2021   Procedure: ESOPHAGOGASTRODUODENOSCOPY (EGD) WITH PROPOFOL;  Surgeon: Lavena Bullion, DO;  Location: [facility];  Service: Gastroenterology;  Laterality: N/A;   GIVENS CAPSULE STUDY N/A 03/16/2021   Procedure: GIVENS CAPSULE STUDY;  Surgeon: Lavena Bullion, DO;  Location: [facility];  Service: Gastroenterology;  Laterality: N/A;   Hemilaminectomy of L4, L5 and S1 decompression of tumor in epidural space  2000   IR FLUORO GUIDED NEEDLE PLC ASPIRATION/INJECTION LOC  07/17/2019  :   Current Facility-Administered Medications:    0.9 %  sodium chloride infusion, 250 mL, Intravenous, Continuous, Ollis, Brandi L, NP, Last Rate: 10 mL/hr at 03/22/21 1828, 250 mL at 03/22/21 1828   acetaminophen (TYLENOL) tablet 650 mg, 650 mg, Oral, Q6H PRN, Aslam, Sadia, MD, 650 mg at 03/21/21 2348   albuterol (PROVENTIL) (2.5 MG/3ML) 0.083% nebulizer solution 2.5 mg, 2.5 mg, Inhalation, Q6H PRN, Cirigliano, Vito V, DO,  2.5 mg at 03/22/21 1144   ceFEPIme (MAXIPIME) 2 g in sodium chloride 0.9 % 100 mL IVPB, 2 g, Intravenous, Q12H, Freda Jackson B, MD, Last Rate: 200 mL/hr at 03/22/21 1830, 2 g at 03/22/21 1830   Chlorhexidine Gluconate Cloth 2 % PADS 6 each, 6 each, Topical, Daily, Candee Furbish, MD, 6 each at 03/22/21 1834   fluticasone furoate-vilanterol (BREO ELLIPTA) 200-25 MCG/INH 1 puff, 1 puff, Inhalation, Daily, Cirigliano, Vito V, DO, 1 puff at 03/22/21 0741   heparin ADULT infusion 100 units/mL (25000 units/222m), 1,450 Units/hr,  Intravenous, Continuous, CDonnamae Jude RPH, Last Rate: 14.5 mL/hr at 03/22/21 1842, 1,450 Units/hr at 03/22/21 1842   metoprolol tartrate (LOPRESSOR) tablet 12.5 mg, 12.5 mg, Oral, BID, JVirl Axe MD   metroNIDAZOLE (FLAGYL) IVPB 500 mg, 500 mg, Intravenous, Q8H, Ollis, Brandi L, NP   norepinephrine (LEVOPHED) '4mg'$  in 2529mpremix infusion, 0-40 mcg/min, Intravenous, Titrated, DeFreddi StarrMD, Last Rate: 90 mL/hr at 03/22/21 1839, 24 mcg/min at 03/22/21 1839   ondansetron (ZOFRAN) injection 4 mg, 4 mg, Intravenous, Q6H PRN, DeFarrel GordonDO, 4 mg at 03/21/21 0826   pantoprazole (PROTONIX) EC tablet 40 mg, 40 mg, Oral, Daily, Cirigliano, Vito V, DO, 40 mg at 03/22/21 0856   senna (SENOKOT) tablet 8.6 mg, 1 tablet, Oral, Daily PRN, Atway, Rayann N, DO, 8.6 mg at 03/22/21 0524   sodium chloride 0.9 % bolus 1,000 mL, 1,000 mL, Intravenous, Once, DeFreddi StarrMD:   Allergies  Allergen Reactions   Prednisone Other (See Comments)    REACTION: Psychosis, talking out of head, insomnia   Seroquel [Quetiapine Fumarate] Nausea Only and Other (See Comments)    Caused very dry mouth  :   Family History  Problem Relation Age of Onset   Stroke Mother        at young age   Heart disease Mother    Heart attack Father    Heart disease Father    Cancer Sister        Unknown   Stroke Brother    Stroke Brother    Sickle cell anemia Brother    Diabetes Brother    Stroke Brother    Stroke Son    Breast cancer Other    Colon cancer Neg Hx    Esophageal cancer Neg Hx    Pancreatic cancer Neg Hx    Stomach cancer Neg Hx    Liver disease Neg Hx    Rectal cancer Neg Hx   :   Social History   Socioeconomic History   Marital status: Widowed    Spouse name: Not on file   Number of children: Not on file   Years of education: 11 th grade   Highest education level: Not on file  Occupational History   Occupation: retired from [employer] sales [employer]   Occupation: volunteerd at  [employer]  Tobacco Use   Smoking status: Never   Smokeless tobacco: Never  Vaping Use   Vaping Use: Never used  Substance and Sexual Activity   Alcohol use: No    Alcohol/week: 0.0 standard drinks   Drug use: No   Sexual activity: Not Currently    Birth control/protection: Post-menopausal  Other Topics Concern   Not on file  Social History Narrative   Current Social History 06/09/2020      Patient lives alone in a ground floor apartment which is 1 story. There are not steps up to the entrance the patient uses. One  son stays with her once in a while.      Patient's method of transportation is personal car.      The highest level of education was some high school: 11th grade.      The patient currently retired.      Identified important Relationships are "My friend Mart Piggs, my nieces, my grandkids, a lot of people.       Pets : None       Interests / Fun: "Playing word games on my phone, watch TV, Talking on phone."       Current Stressors: "Nothing, really. I try not to let anything stress me."       Religious / Personal Beliefs: "I believe in God."       L. Ducatte, BSN, RN-BC       Social Determinants of Health   Financial Resource Strain: Not on file  Food Insecurity: Not on file  Transportation Needs: Not on file  Physical Activity: Not on file  Stress: Not on file  Social Connections: Not on file  Intimate Partner Violence: Not on file  :  Pertinent items are noted in HPI.  Exam: Blood pressure (!) 98/49, pulse (!) 140, temperature (!) 96.2 F (35.7 C), temperature source Axillary, resp. rate 15, height '5\' 7"'$  (1.702 m), weight 276 lb 3.8 oz (125.3 kg), SpO2 99 %.   General appearance: Ill-appearing without acute distress. Head: atraumatic without any abnormalities. Eyes: conjunctivae/corneas clear. PERRL.  Sclera anicteric. Throat: lips, mucosa, and tongue normal; without oral thrush or ulcers. Resp: clear to auscultation bilaterally without  rhonchi, wheezes or dullness to percussion. Cardio: regular rate and rhythm, S1, S2 normal, no murmur, click, rub or gallop GI: soft, tender to palpation. Skin: Skin color, texture, turgor normal. No rashes or lesions Lymph nodes: Cervical, supraclavicular, and axillary nodes normal. Neurologic: Grossly normal without any motor, sensory or deep tendon reflexes. Musculoskeletal: No joint deformity or effusion.   Recent Labs    03/22/21 0226 03/22/21 1550 03/22/21 1618  WBC 204.4* 264.9*  --   HGB 8.2* 7.6* 10.9*  HCT 25.7* 24.8* 32.0*  PLT 109* 114*  --     Recent Labs    03/21/21 0429 03/22/21 0226 03/22/21 1618  NA 137 137 135  K 3.7 3.6 4.4  CL 101 100  --   CO2 23 24  --   GLUCOSE 47* 38*  --   BUN 76* 77*  --   CREATININE 1.99* 2.06*  --   CALCIUM 9.1 9.0  --        CT ABDOMEN PELVIS WO CONTRAST  Result Date: 03/22/2021 CLINICAL DATA:  Abdominal distension. EXAM: CT ABDOMEN AND PELVIS WITHOUT CONTRAST TECHNIQUE: Multidetector CT imaging of the abdomen and pelvis was performed following the standard protocol without IV contrast. COMPARISON:  Renal ultrasound, 03/16/2021. Chest radiograph, 03/17/2021. CT abdomen and pelvis, 05/13/2017. FINDINGS: Lower chest: Trace left pleural effusion. Bilateral dependent consolidation, favor atelectasis. Cardiomegaly. Aortic valve and descending thoracic aorta calcifications. Hepatobiliary: Normal noncontrast appearance. Status post cholecystectomy. No biliary dilatation. Pancreas: Atrophic. Spleen: Heterogeneous noncontrast appearance of spleen, with small foci of gas within the parenchyma (see key images. Splenomegaly, with spleen measuring up to 14 cm. Adrenals/Urinary Tract: Normal noncontrast appearance of the adrenal glands and kidneys. No renal calculi, or hydronephrosis. Bladder is unremarkable. Stomach/Bowel: Nonobstructive bowel. Capsule endoscopy camera with the cecum. Nondilated colon. Appendix is difficult to visualize.  Vascular/Lymphatic: Aortic atherosclerosis. No enlarged abdominal or pelvic lymph nodes. Reproductive: Calcified  leiomyoma. Other: Moderate volume abdominopelvic ascites, new since renal ultrasound 03/16/2021. Musculoskeletal: Anasarca.  No acute osseous abnormality. IMPRESSION: 1. Interval marked enlargement of spleen, with foci of parenchymal gas. Differential diagnosis includes spontaneous splenic rupture versus superimposed infection. Findings are incompletely assessed on this noncontrast examination, recommend contrasted CT abdomen pelvis for further evaluation. 2. New moderate volume of abdominopelvic ascites. 3. Additional chronic and senescent changes, as above. These results were called by telephone at the time of interpretation on 03/22/2021 at 12:26 pm to patient's RN Elisabeth Cara), to be relayed to provider [facility] , who verbally acknowledged these results. Electronically Signed   By: Michaelle Birks M.D.   On: 03/22/2021 12:32   CT ABDOMEN PELVIS W CONTRAST  Result Date: 03/22/2021 CLINICAL DATA:  Abdominal distension EXAM: CT ABDOMEN AND PELVIS WITH CONTRAST TECHNIQUE: Multidetector CT imaging of the abdomen and pelvis was performed using the standard protocol following bolus administration of intravenous contrast. CONTRAST:  66m OMNIPAQUE IOHEXOL 350 MG/ML SOLN COMPARISON:  CT from earlier in the same day. FINDINGS: Lower chest: Lung bases again demonstrate small bilateral pleural effusions with bilateral lower lobe consolidation slightly increased when compared with the prior exam. No sizable parenchymal nodule is noted. Hepatobiliary: Liver is well visualized with evidence of decreased attenuation consistent with fatty infiltration. Mild nodularity is noted suggestive mild cirrhosis. The gallbladder has been surgically removed. Perihepatic fluid is again identified. Pancreas: The pancreas is predominately fatty replaced and atrophic. Spleen: Spleen is again enlarged with multiple foci of air within.  Patchy enhancement is noted even on delayed images despite contrast administration. The majority of the spleen is not demonstrate significant enhancement. Splenic vein is occluded from the level of the splenic hilum to within 3 cm of the spleno portal confluence. These changes likely causing origin of the spleen and likely infarction with poor perfusion and areas of air within. The air is highly suggestive of underlying infection. Adrenals/Urinary Tract: Adrenal glands are within normal limits. Kidneys demonstrate a normal enhancement pattern. Delayed images demonstrate no significant enhancement likely related to the known renal failure. No obstructive changes are seen. The bladder is decompressed. Stomach/Bowel: Administered givens capsule lies in the proximal right colon. The appendix is not well visualized although no inflammatory changes are seen. Small bowel and stomach appear within normal limits. Vascular/Lymphatic: Aortic atherosclerosis. No enlarged abdominal or pelvic lymph nodes. Reproductive: Uterus shows calcified uterine fibroid similar to that seen on prior exam. No adnexal mass is noted. Other: Ascites is noted throughout the abdomen similar to that seen on the prior exam. This may be related to the changes in the spleen although evaluation is difficult. Generalized changes of anasarca are noted. Small fat containing anterior abdominal wall hernia is noted just above the umbilicus Musculoskeletal: No acute bony abnormality is noted. IMPRESSION: Persistent enlarged spleen with patchy areas of enhancement following contrast administration. Small foci of air are noted within. There are changes consistent with splenic vein thrombosis extending from the splenic hilum to within 3 cm of the spleno portal confluence. This is likely the etiology of the enlarged spleen and what appears to be diffuse splenic infarction. Foci of air within suggest underlying infection. Changes of cirrhosis of the liver with  mild ascites and changes of anasarca. Bilateral pleural effusions with increasing lower lobe consolidation when compared with the study obtained earlier in the same day. Critical Value/emergent results were called by telephone at the time of interpretation on 03/22/2021 at 6:03 pm to BNoe Gens NP who verbally acknowledged  these results. Electronically Signed   By: Inez Catalina M.D.   On: 03/22/2021 18:04    Assessment and Plan:   80 year old with:  1. CMML with possible myeloproliferative disorder NOS.  Currently hospitalized with worsening anemia and sepsis physiology and noted to have profound leukocytosis.     Her peripheral smear does not show any evidence of acute blasts at this time rather mature neutrophilia which is likely reactive to her acute illness with monocytosis that is consistent with likely CMML.     I see no need for acute intervention from an oncology standpoint other than supportive care and transfusion as you are doing.   2.  Abdominal pain: CT scan shows persistent enlarged spleen with splenic thrombosis and possible infarct.  Foci of air within suggest underlying infection.  These findings are likely consistent with myeloproliferative disorder causing a megaly and splenic infarct is spontaneous.  I agree with anticoagulation with supportive care without any indication for splenectomy.   3.  We will continue to follow her progress with you.     80  minutes were dedicated to this encounter.  The time was spent on reviewing laboratory data, imaging studies, reviewing peripheral smear and outlining management options for the future.

## 2021-03-22 NOTE — Progress Notes (Signed)
eLink Physician-Brief Progress Note Patient Name: Erika [name] DOB: [birth year] MRN: [id number]   Date of Service  03/22/2021  HPI/Events of Note  Lactic acid of 3.3 likely due to a combination of hypoperfusion related to extreme leukocytosis and altered blood rheologic properties, and probable intra abdominal thrombosis and sepsis.  eICU Interventions  Will give a 250 ml Normal Saline bolus and obtain follow up Lactic acid to assess impact.        Kerry Kass Markiesha Delia 03/22/2021, 7:51 PM

## 2021-03-22 NOTE — Progress Notes (Signed)
Patient ID: Erika [name], female   DOB: [birth year], 80 y.o.   MRN: [id number] Reviewed CT scan.  She has isolated splenic vein thrombosis.  Without a history of bleeding varices I do not think splenectomy is indicated.  This is likely malignancy related given wbc.  I think that anticoagulation and supportive care per ccm are the best plan. We will follow

## 2021-03-22 NOTE — Progress Notes (Signed)
Pt noted to have yellow MEWS due to BP: 93/46 and HR: 110. MD Atway notified, metropolol on hold, team currently rounding will be by to assess soon per provider.

## 2021-03-22 NOTE — Progress Notes (Addendum)
Pt found with oxygen saturations in the 80s on previous requirement of 2L nasal cannula. Pt denies SOB at this time. Writer placed the pt of 5L nasal cannula, with improved oxygenation up to 93%, however O2 still noted to decrease to 85% intermittently. Pt noted to look increasingly ill, and lethargic, closing her eyes intermittently through conversation. Writer paged covering provider Atway no new orders at this time.

## 2021-03-22 NOTE — Significant Event (Addendum)
Rapid Response Event Note   Reason for Call :  Lethargy, increased supplemental oxygen needs  Initial Focused Assessment:  Pt lying in bed, drowsy. Oriented x3, following commands. Skin is cool in the upper extremities. Color is pale. Lung sounds are clear. No adventitious heart sounds, heart rate is 125-140 bpm. Abdomen is soft, midline hernia noted. Pt having frequent tarry, loose stools. Pitting edema 3+. Pt denies pain and nausea. Pt belching persistently, increasingly following sips of oral contrast.   Pt became hypotension, mentation unchanged.   VS: T 97.26F, BP 67/45, HR 131, RR 19, SPO2 96% on 100% NRB CBG: 78  Interventions:  -500 cc bolus -ABG: 7.271/ 52/ 58/ 24.1 -Levophed gtt -CT abd/pelvis with contrast -CCS and PCCM consulted  Plan of Care:  Transfer to ICU  Event Summary:  MD Notified: IMTS Call Time: [place] Time: 1510 End Time: [place], RN

## 2021-03-22 NOTE — Progress Notes (Signed)
RT called to assess patient's O2 sats. Upon arrival RN had placed pt on 12L [place] with sats at 90%. Pt very somnolent, ABG obtained per MD order.

## 2021-03-22 NOTE — Progress Notes (Signed)
I was asked to comment on this patient's increase of leukocytosis as a contributing factor for her medical decompensation.  She is a 80 year old woman followed by Dr. Irene Limbo for myeloproliferative disorder hospitalized on March 16, 2021 for worsening anemia and question of old GI bleed.   She was noted to have leukocytosis with predominantly neutrophilia with peripheral smear showed no evidence of schistocytes and predominantly mature neutrophils and monocytosis which is consistent with CMML rather than acute leukemia.   Laboratory data on March 22, 2021 showed a white cell count of 269, hemoglobin of 7.6 and platelet count of 114.  Differential showed predominantly neutrophils and monocytosis which is consistent with CMML/myeloproliferative disorder rather than an acute leukemic process.   She is getting a supportive transfusion with hemoglobin at 1418 was 10.9 with a hematocrit of 32.0.  She developed worsening hypotension with blood pressure of 76/53 and critical care services are involved and I was asked to comment about her laboratory findings.  Rapid response team was called to evaluate the patient after noted hypotension, tachycardia and signs of symptoms of sepsis.  Cefepime was started at that time for presumed sepsis.    Assessment and recommendation:  80 year old woman with CMML with possible myeloproliferative disorder NOS.  Currently hospitalized with worsening anemia and sepsis physiology and noted to have profound leukocytosis.   Her peripheral smear does not show any evidence of acute blasts at this time rather mature neutrophilia which is likely reactive to her acute illness with monocytosis that is consistent with likely CMML.   I see no need for acute intervention from an oncology standpoint other than supportive care and transfusion as you are doing.   We will continue to follow her progress during his hospitalization.

## 2021-03-22 NOTE — Progress Notes (Signed)
PT Cancellation Note  Patient Details Name: Erika [name] MRN: [id number] DOB: [birth year]   Cancelled Treatment:    Reason Eval/Treat Not Completed: Medical issues which prohibited therapy- HR sustaining 130s-140s at rest; awaiting MD evaluation. Will follow-up for PT evaluation as schedule permits.  Mabeline Caras, PT, DPT Acute Rehabilitation Services  Pager [phone] Office [place] 03/22/2021, 10:13 AM

## 2021-03-22 NOTE — Progress Notes (Addendum)
Pt blood pressure noted to drop to 67/53, remains on high flow nasal cannula at 12L. Respiratory at bedside. Pt remains lethargic. Internal medicine paged and notified.  MD Atway to consult critical care. Per rapid response nurse give 574m bolus of LR. Per critical care NP give half an amp of dextrose.

## 2021-03-22 NOTE — Progress Notes (Signed)
Pt noted to still have increased oxygen demands, pt placed on nonrebreather at 15L, with oxygen saturation between 87%-95%. Pt also noted to be increasingly lethargic and reports being "tired".  VS, and blood glucose obtained. Rapid response called, in route. On call provider in route to further assess.

## 2021-03-22 NOTE — Progress Notes (Addendum)
Paged by nurse for increasing oxygen requirements. Patient has previously been on 2L [place], however, she was satting at 85%, and increased to 5L. She was still in the low 80s on 5L and was increased to 15L HFNC by the rapid response nurse. Did not have a good pleth wave on her pulse ox, but on a different machine, she was seen to have a good wave form and was satting at 100% on the 15L O2. Patient remains alert and oriented to person, place, time, and situation. Not complaining of any discomfort or pain at this time. The patient feels the same as she did earlier today.   Spoke with son, Gerald Stabs. Unsure if he was able to comprehend the full extend of the current situation. Gerald Stabs will try to come by later to see the patient. Could not get in touch with other son or grand daughter.    Physical Exam:  Constitutional: fatigued, very ill appearing elderly woman Cardio: tachycardic rate, regular rhythm; pitting edema in bilateral lower extremities (no change) Pulm: Increased work of breathing on 15L HFNC, body habitus limiting lung sounds, no overt crackles or rales heard, some wheezing noted Abd: soft, non tender, splenomegaly noted, hernia above umbilicus also noted (unchanged from earlier) Extremities: Feet are noted to be warm, hands are cold   A/P:  Hypoxic respiratory failure: -  Will trial bipap to help with the patient's work of breathing - ABG to see if patient is truly hypoxic - Low threshold to contact PCCM, if she remains tachycardic and MAPS <60 - Patient wishes to remain full code  Myeloproliferative neoplasm: - LDH, uric acid, CBC with diff and path smear to see if transitioning to acute leukemia with concern for possible blast crisis  Abdominal pain/possible splenic rupture Patient states that she is not having much pain in her abdomen, as she was previously. CT abd/pelvis with concern for splenic rupture vs superimposed infection. Stat CT abd/pelvis with contrast ordered, still  pending.  - F/u CT abd/pelvis with contrast - Holding eliquis     1545 addendum: Paged for BP 67/53. PCCM consulted and will evaluate the patient.

## 2021-03-22 NOTE — Consult Note (Signed)
[name] May 27, 1941  [id number].    Requesting MD: Dr. Daryll Drown Chief Complaint/Reason for Consult: Splenomegaly w/ possible rupture  HPI: Erika [name] is 80 y.o. female with a hx of CHF, asthma, A. Flutter on Eliquis (last dose today), HTN, and severe anemia followed by Dr. Irene Limbo who we were asked to see for possible splenic rupture.  Patient presented on 8/10 - 8/11 w/ increasing weakness and blood in her stools. WBC noted to be 172.8, hgb 6.3, platelets 142, AKI w/ Cr 3.35, INR 2.9, fecal occult positive. She was admitted to IM. She was given 3U PRBC during admission and GI was consulted. Per notes she had a recent colonoscopy 01/2021 that showed 3 subcentimeter tubular adenomas, otherwise normal appearing colon.  Upper endoscopy showed mild, nonbleeding, nonulcerated gastritis and was otherwise without any signs of active bleeding.  WBC continue to elevate to 204.4 today. She has been found to have E. Coli Bacteremia and a UTI for which she is on Rocephin. She Hemoglobin stable at 8.2.  She was restarted on Eliquis yesterday and received a dose at 2100 yesterday and 8:55 AM this morning.  She reported to have abdominal pain right above her umbilicus this morning where her hernia is located. She underwent CT A/P w/o contrast 2/2 kidney function that showed  enlargement of spleen, with foci of parenchymal gas with a differential diagnosis includes spontaneous splenic rupture versus superimposed infection. This also showed a fat containing supraumbilical hernia. IM reached out to our team and spoke with my attending. At that time the patient was reported to be stable and plan was to repeat CT scan w/ IV contrast to evaluate. Upon chart review patient became tachycardic to the 130s, hypoxic requiring high flow nasal cannula, with soft BP requiring boluses.  CCM has been called.  We came to bedside to examine the patient.  She reports since having a bowel movement her abdominal pain has stopped.   She does not have any current abdominal pain.  Her BM does not appear to be melanous.  ROS: Review of Systems  All other systems reviewed and are negative.  Family History  Problem Relation Age of Onset   Stroke Mother        at young age   Heart disease Mother    Heart attack Father    Heart disease Father    Cancer Sister        Unknown   Stroke Brother    Stroke Brother    Sickle cell anemia Brother    Diabetes Brother    Stroke Brother    Stroke Son    Breast cancer Other    Colon cancer Neg Hx    Esophageal cancer Neg Hx    Pancreatic cancer Neg Hx    Stomach cancer Neg Hx    Liver disease Neg Hx    Rectal cancer Neg Hx     Past Medical History:  Diagnosis Date   Anemia    with menses   Asthma    Atrial flutter, paroxysmal ([place])    Back pain    status post surgery 2002   Blood transfusion without reported diagnosis    Breast cyst    Excesion with FNA, begnin in 2004.    CHF (congestive heart failure) (HCC)    Degenerative joint disease of spine    Imaging 2005,  Degenerative hypertrophic facet arthritis changes L4-5 and L5-S1.[name]    History of shingles    Recurrent  with post herpetic neuralgia.    Hypertension    Lymphadenopathy    Of the mediastinum, Right side CXR 2008, not read on 2010 cxr.    Menopause    Obesity    BMI 54   Psychosis ([place])    Secondary to prednisone.   Shingles    Stasis dermatitis    W/ LE edema, prviously on lasix now on mazxide.    SVT (supraventricular tachycardia) ([place]) 01/2008   one run while hospitalized   Tuberculosis    active TB treated in 2002, hx of paraspinal lumbar TB,     Past Surgical History:  Procedure Laterality Date   BIOPSY  03/14/2021   Procedure: BIOPSY;  Surgeon: Lavena Bullion, DO;  Location: [facility];  Service: Gastroenterology;;   BREAST CYST ASPIRATION Left    Breast cyst biopsy     CHOLECYSTECTOMY  02/02/2012   Procedure: LAPAROSCOPIC CHOLECYSTECTOMY;  Surgeon: Zenovia Jarred, MD;   Location: [facility];  Service: General;  Laterality: N/A;   ESOPHAGOGASTRODUODENOSCOPY (EGD) WITH PROPOFOL N/A 03/13/2021   Procedure: ESOPHAGOGASTRODUODENOSCOPY (EGD) WITH PROPOFOL;  Surgeon: Lavena Bullion, DO;  Location: [facility];  Service: Gastroenterology;  Laterality: N/A;   GIVENS CAPSULE STUDY N/A 03/19/2021   Procedure: GIVENS CAPSULE STUDY;  Surgeon: Lavena Bullion, DO;  Location: [facility];  Service: Gastroenterology;  Laterality: N/A;   Hemilaminectomy of L4, L5 and S1 decompression of tumor in epidural space  2000   IR FLUORO GUIDED NEEDLE PLC ASPIRATION/INJECTION LOC  07/17/2019    Social History:  reports that she has never smoked. She has never used smokeless tobacco. She reports that she does not drink alcohol and does not use drugs.  Allergies:  Allergies  Allergen Reactions   Prednisone Other (See Comments)    REACTION: Psychosis, talking out of head, insomnia   Seroquel [Quetiapine Fumarate] Nausea Only and Other (See Comments)    Caused very dry mouth    Medications Prior to Admission  Medication Sig Dispense Refill   ADVAIR DISKUS 500-50 MCG/DOSE AEPB Inhale 1 puff into the lungs 2 (two) times daily. 180 each 3   albuterol (VENTOLIN HFA) 108 (90 Base) MCG/ACT inhaler INHALE 1-2 PUFFS INTO THE LUNGS EVERY 6 HOURS AS NEEDED FOR WHEEZING OR FOR SHORTNESS OF BREATH 8.5 g 11   allopurinol (ZYLOPRIM) 300 MG tablet Take 0.5 tablets (150 mg total) by mouth 2 (two) times daily. 90 tablet 3   amLODipine (NORVASC) 10 MG tablet Take 0.5 tablets (5 mg total) by mouth daily. 90 tablet 3   apixaban (ELIQUIS) 5 MG TABS tablet Take 1 tablet (5 mg total) by mouth 2 (two) times daily. 60 tablet 11   aspirin EC 81 MG tablet Take 1 tablet (81 mg total) by mouth daily. 90 tablet 3   Ensure Max Protein (ENSURE MAX PROTEIN) LIQD Take 330 mLs (11 oz total) by mouth 2 (two) times daily. (Patient taking differently: Take 11 oz by mouth 2 (two) times daily. She drinks regular Ensure  not Ensure Max and she prefers chocolate)     fluticasone (FLONASE) 50 MCG/ACT nasal spray SHAKE LIQUID AND INSTILL ONE (1) SPRAY IN EACH NOSTRIL ONCE DAILY (Patient taking differently: Place 1 spray into both nostrils daily as needed for allergies.) 16 g 10   furosemide (LASIX) 40 MG tablet Take 1 tablet (40 mg total) by mouth daily. 90 tablet 2   metoprolol tartrate (LOPRESSOR) 50 MG tablet TAKE 1 TABLET BY MOUTH TWICE DAILY 60 tablet 11  NYAMYC powder APPLY TOPICALLY TO AFFECTED AREAS TWICE DAILY (Patient taking differently: Apply 1 application topically 2 (two) times daily.) 15 g 3   omeprazole (PRILOSEC) 20 MG capsule TAKE 1 CAPSULE BY MOUTH DAILY 30 capsule 10   potassium chloride SA (KLOR-CON) 20 MEQ tablet Take 0.5 tablets (10 mEq total) by mouth daily. 15 tablet 3   pravastatin (PRAVACHOL) 20 MG tablet Take 1 tablet (20 mg total) by mouth daily. 90 tablet 3   sodium chloride (OCEAN) 0.65 % SOLN nasal spray Place 1 spray into both nostrils as needed for congestion. 88 mL 0     Physical Exam: Blood pressure (!) 76/35, pulse (!) 136, temperature (!) 97.5 F (36.4 C), temperature source Oral, resp. rate (!) 21, height '5\' 7"'$  (1.702 m), weight 125.3 kg, SpO2 96 %. General: pleasant, WD/WN AA female who is laying in bed in NAD HEENT: head is normocephalic, atraumatic.  Sclera are noninjected.  PERRL.  Ears and nose without any masses or lesions.  Mouth is pink and moist. Dentition fair Heart: Tachycardic with regular rhythm.  No obvious murmurs, gallops, or rubs noted.  Palpable radial pulses bilaterally  Lungs: CTAB, no wheezes, rhonchi, or rales noted.  Respiratory effort nonlabored Abd: Soft, ND, NT except for over her supraumbilical hernia that is partially reducible. No peritonitis on my or my attendings excam. +BS, no masses, hernias, or obvious splenomegaly/organomegaly MS: 2-3+ LE edema bilaterally, calves soft and nontender Skin: warm and dry with no masses, lesions, or  rashes Psych: A&Ox3 with an appropriate affect Neuro: cranial nerves grossly intact, normal speech, thought process intact, moves all extremities, gait not assessed   Results for orders placed or performed during the hospital encounter of 04/04/2021 (from the past 48 hour(s))  Glucose, capillary     Status: Abnormal   Collection Time: 03/17/2021  4:37 PM  Result Value Ref Range   Glucose-Capillary 108 (H) 70 - 99 mg/dL    Comment: Glucose reference range applies only to samples taken after fasting for at least 8 hours.  CBC     Status: Abnormal   Collection Time: 03/21/21  4:29 AM  Result Value Ref Range   WBC 154.3 (HH) 4.0 - 10.5 K/uL    Comment: CRITICAL VALUE NOTED.  VALUE IS CONSISTENT WITH PREVIOUSLY REPORTED AND CALLED VALUE. REPEATED TO VERIFY    RBC 2.62 (L) 3.87 - 5.11 MIL/uL   Hemoglobin 8.0 (L) 12.0 - 15.0 g/dL   HCT 24.9 (L) 36.0 - 46.0 %   MCV 95.0 80.0 - 100.0 fL   MCH 30.5 26.0 - 34.0 pg   MCHC 32.1 30.0 - 36.0 g/dL   RDW 18.8 (H) 11.5 - 15.5 %   Platelets 97 (L) 150 - 400 K/uL    Comment: Immature Platelet Fraction may be clinically indicated, consider ordering this additional test [id number] CONSISTENT WITH PREVIOUS RESULT REPEATED TO VERIFY    nRBC 0.0 0.0 - 0.2 %    Comment: Performed at [facility], [place] [address], [place], [place] [id number]  Basic metabolic panel     Status: Abnormal   Collection Time: 03/21/21  4:29 AM  Result Value Ref Range   Sodium 137 135 - 145 mmol/L   Potassium 3.7 3.5 - 5.1 mmol/L   Chloride 101 98 - 111 mmol/L   CO2 23 22 - 32 mmol/L   Glucose, Bld 47 (L) 70 - 99 mg/dL    Comment: Glucose reference range applies only to samples taken after fasting for  at least 8 hours.   BUN 76 (H) 8 - 23 mg/dL   Creatinine, Ser 1.99 (H) 0.44 - 1.00 mg/dL   Calcium 9.1 8.9 - 10.3 mg/dL   GFR, Estimated 25 (L) >60 mL/min    Comment: (NOTE) Calculated using the CKD-EPI Creatinine Equation (2021)    Anion gap 13 5 - 15    Comment:  Performed at [facility] [address] [place], [place] [address]  Glucose, capillary     Status: None   Collection Time: 03/21/21  6:13 AM  Result Value Ref Range   Glucose-Capillary 84 70 - 99 mg/dL    Comment: Glucose reference range applies only to samples taken after fasting for at least 8 hours.  Glucose, capillary     Status: Abnormal   Collection Time: 03/21/21 11:34 AM  Result Value Ref Range   Glucose-Capillary 123 (H) 70 - 99 mg/dL    Comment: Glucose reference range applies only to samples taken after fasting for at least 8 hours.  Glucose, capillary     Status: None   Collection Time: 03/21/21  4:30 PM  Result Value Ref Range   Glucose-Capillary 96 70 - 99 mg/dL    Comment: Glucose reference range applies only to samples taken after fasting for at least 8 hours.  Glucose, capillary     Status: None   Collection Time: 03/21/21  8:55 PM  Result Value Ref Range   Glucose-Capillary 83 70 - 99 mg/dL    Comment: Glucose reference range applies only to samples taken after fasting for at least 8 hours.  CBC     Status: Abnormal   Collection Time: 03/22/21  2:26 AM  Result Value Ref Range   WBC 204.4 (HH) 4.0 - 10.5 K/uL    Comment: CRITICAL VALUE NOTED.  VALUE IS CONSISTENT WITH PREVIOUSLY REPORTED AND CALLED VALUE. REPEATED TO VERIFY    RBC 2.66 (L) 3.87 - 5.11 MIL/uL   Hemoglobin 8.2 (L) 12.0 - 15.0 g/dL   HCT 25.7 (L) 36.0 - 46.0 %   MCV 96.6 80.0 - 100.0 fL   MCH 30.8 26.0 - 34.0 pg   MCHC 31.9 30.0 - 36.0 g/dL   RDW 18.8 (H) 11.5 - 15.5 %   Platelets 109 (L) 150 - 400 K/uL    Comment: Immature Platelet Fraction may be clinically indicated, consider ordering this additional test [id number] CONSISTENT WITH PREVIOUS RESULT REPEATED TO VERIFY    nRBC 0.0 0.0 - 0.2 %    Comment: Performed at [facility], [place] [address], [place], [place] [id number]  Basic metabolic panel     Status: Abnormal   Collection Time: 03/22/21  2:26 AM  Result Value Ref Range    Sodium 137 135 - 145 mmol/L   Potassium 3.6 3.5 - 5.1 mmol/L   Chloride 100 98 - 111 mmol/L   CO2 24 22 - 32 mmol/L   Glucose, Bld 38 (LL) 70 - 99 mg/dL    Comment: Glucose reference range applies only to samples taken after fasting for at least 8 hours. CRITICAL RESULT CALLED TO, READ BACK BY AND VERIFIED WITH: D. HART, RN. '@[id number]'$  [id number] BLANKENSHIP R.     BUN 77 (H) 8 - 23 mg/dL   Creatinine, Ser 2.06 (H) 0.44 - 1.00 mg/dL    Comment: DELTA CHECK NOTED   Calcium 9.0 8.9 - 10.3 mg/dL   GFR, Estimated 24 (L) >60 mL/min    Comment: (NOTE) Calculated using the CKD-EPI Creatinine  Equation (2021)    Anion gap 13 5 - 15    Comment: Performed at [facility], [place] [address], [place], [place] [address]  Glucose, capillary     Status: None   Collection Time: 03/22/21  4:11 AM  Result Value Ref Range   Glucose-Capillary 83 70 - 99 mg/dL    Comment: Glucose reference range applies only to samples taken after fasting for at least 8 hours.  Glucose, capillary     Status: None   Collection Time: 03/22/21  6:10 AM  Result Value Ref Range   Glucose-Capillary 79 70 - 99 mg/dL    Comment: Glucose reference range applies only to samples taken after fasting for at least 8 hours.  Glucose, capillary     Status: Abnormal   Collection Time: 03/22/21 10:55 AM  Result Value Ref Range   Glucose-Capillary 113 (H) 70 - 99 mg/dL    Comment: Glucose reference range applies only to samples taken after fasting for at least 8 hours.  Glucose, capillary     Status: None   Collection Time: 03/22/21  2:59 PM  Result Value Ref Range   Glucose-Capillary 78 70 - 99 mg/dL    Comment: Glucose reference range applies only to samples taken after fasting for at least 8 hours.   CT ABDOMEN PELVIS WO CONTRAST  Result Date: 03/22/2021 CLINICAL DATA:  Abdominal distension. EXAM: CT ABDOMEN AND PELVIS WITHOUT CONTRAST TECHNIQUE: Multidetector CT imaging of the abdomen and pelvis was performed following the  standard protocol without IV contrast. COMPARISON:  Renal ultrasound, 03/16/2021. Chest radiograph, 03/17/2021. CT abdomen and pelvis, 05/13/2017. FINDINGS: Lower chest: Trace left pleural effusion. Bilateral dependent consolidation, favor atelectasis. Cardiomegaly. Aortic valve and descending thoracic aorta calcifications. Hepatobiliary: Normal noncontrast appearance. Status post cholecystectomy. No biliary dilatation. Pancreas: Atrophic. Spleen: Heterogeneous noncontrast appearance of spleen, with small foci of gas within the parenchyma (see key images. Splenomegaly, with spleen measuring up to 14 cm. Adrenals/Urinary Tract: Normal noncontrast appearance of the adrenal glands and kidneys. No renal calculi, or hydronephrosis. Bladder is unremarkable. Stomach/Bowel: Nonobstructive bowel. Capsule endoscopy camera with the cecum. Nondilated colon. Appendix is difficult to visualize. Vascular/Lymphatic: Aortic atherosclerosis. No enlarged abdominal or pelvic lymph nodes. Reproductive: Calcified leiomyoma. Other: Moderate volume abdominopelvic ascites, new since renal ultrasound 03/16/2021. Musculoskeletal: Anasarca.  No acute osseous abnormality. IMPRESSION: 1. Interval marked enlargement of spleen, with foci of parenchymal gas. Differential diagnosis includes spontaneous splenic rupture versus superimposed infection. Findings are incompletely assessed on this noncontrast examination, recommend contrasted CT abdomen pelvis for further evaluation. 2. New moderate volume of abdominopelvic ascites. 3. Additional chronic and senescent changes, as above. These results were called by telephone at the time of interpretation on 03/22/2021 at 12:26 pm to patient's RN Elisabeth Cara), to be relayed to provider [facility] , who verbally acknowledged these results. Electronically Signed   By: Michaelle Birks M.D.   On: 03/22/2021 12:32    Anti-infectives (From admission, onward)    Start     Dose/Rate Route Frequency Ordered Stop    03/21/21 1445  cefTRIAXone (ROCEPHIN) 1 g in sodium chloride 0.9 % 100 mL IVPB        1 g 200 mL/hr over 30 Minutes Intravenous Every 24 hours 03/21/21 1354 03/26/21 1444   03/19/21 1000  cefTRIAXone (ROCEPHIN) 1 g in sodium chloride 0.9 % 100 mL IVPB        1 g 200 mL/hr over 30 Minutes Intravenous Every 24 hours 03/06/2021 1621 03/21/21 0905  03/27/2021 1200  vancomycin (VANCOREADY) IVPB 500 mg/100 mL  Status:  Discontinued        500 mg 100 mL/hr over 60 Minutes Intravenous Every 24 hours 03/17/21 1108 03/16/2021 1621   03/17/21 1200  ceFEPIme (MAXIPIME) 2 g in sodium chloride 0.9 % 100 mL IVPB  Status:  Discontinued        2 g 200 mL/hr over 30 Minutes Intravenous Every 24 hours 03/17/21 1107 03/13/2021 1621   03/17/21 1145  vancomycin (VANCOREADY) IVPB 1250 mg/250 mL        1,250 mg 166.7 mL/hr over 90 Minutes Intravenous  Once 03/17/21 1056 03/17/21 1517   03/16/21 1330  cefTRIAXone (ROCEPHIN) 1 g in sodium chloride 0.9 % 100 mL IVPB  Status:  Discontinued        1 g 200 mL/hr over 30 Minutes Intravenous Every 24 hours 03/16/21 1306 03/17/21 1052       Assessment/Plan Splenomegaly with possible rupture - This is a 80 year old female who we are asked to evaluate for splenomegaly w/ possible splenic rupture on noncontrast CT scan.  Patient currently admitted for acute blood loss anemia that was thought to be secondary to a GI bleed. During admission she had a reassuring endoscopy.  She did not undergo colonoscopy during admission but had a reassuring colonoscopy in June of this year.  She is on anticoagulation (Eliquis) with last dose today and this is currently being held.  She is also being treated for E. coli bacteremia and UTI. Her WBC was found to be in the 200's.  CCM is currently seeing her for new onset hypoxic respiratory failure on HFNC.  She appears to be fluid overloaded with 3+ lower extremity edema.  Her pressures are soft in the 70s with heart rate in the 130s.  Currently her  abdomen is soft and without any tenderness except for over her supraumbilical hernia that is partially reducible and fat-containing on CT scan.  She has no peritonitis. After discussion with my attending, no current recommendations for emergency surgery. Would expect if patient was to have splenic rupture she would have tenderness over her LUQ on exam. Splenomegaly could be from the underlying etiology that is driving her WBC in the 200's, although this underlying etiology is not clear at this time. CCM is seeing her for resuscitation. Could consider IR eval for embolization if patient does not improve. We have discussed with IM team.   Jillyn Ledger, [facility] 03/22/2021, 4:07 PM Please see Amion for pager number during day hours 7:00am-4:30pm

## 2021-03-22 NOTE — Progress Notes (Signed)
Pt remains tachcardic 130-140. Pt also reports 10/10 abdominal pain with movement, provider notified and aware. Pain medication ordered, 500 bolus of LR ordered, CT ordered. Will continued to monitor.

## 2021-03-22 NOTE — Progress Notes (Signed)
[facility] Progress Note Patient Name: Erika [name] DOB: 03-13-1941 MRN: [id number]   Date of Service  03/22/2021  HPI/Events of Note  Patient is on scheduled PO Metoprolol but she is NPO and without an NG tube.  eICU Interventions  Oral Metoprolol discontinued and replaced with iv Metoprolol 2.5 mg iv Q 6 hours , with hold parameters.        Kerry Kass Amai Cappiello 03/22/2021, 10:58 PM

## 2021-03-22 NOTE — Progress Notes (Addendum)
Daily Rounding Note  03/22/2021, 1:40 PM  LOS: 6 days   SUBJECTIVE:   Chief complaint:   Hematochezia.  Anemia.  Stool today is loose, watery, brown.  No evidence of blood.  She has had tachycardia to 130s, declining oxygen sats, decreased level of consciousness. Noncontrasted CT obtained to evaluate a visible and palpable bulge in her upper mid abdomen. This shows interval, marked enlargement of spleen to 14 cm and foci of parenchymal gas.  DDx includes spontaneous splenic rupture versus superimposed infection.  This is not well seen on noncontrast CT.  New, moderate abdominopelvic ascites. Patient is now to undergo contrast CT but needs to drink oral contrast beforehand.  OBJECTIVE:         Vital signs in last 24 hours:    Temp:  [97.1 F (36.2 C)-98.4 F (36.9 C)] 97.1 F (36.2 C) (08/17 1049) Pulse Rate:  [84-136] 132 (08/17 1049) Resp:  [18-28] 19 (08/17 0740) BP: (91-129)/(46-92) 110/48 (08/17 1049) SpO2:  [91 %-100 %] 97 % (08/17 1049) Weight:  [125.3 kg] 125.3 kg (08/17 0419) Last BM Date: 03/21/21 Filed Weights   03/30/2021 0400 03/21/21 0622 03/22/21 0419  Weight: 132.6 kg 128.4 kg 125.3 kg   General: Lethargic.  Awakens but not speaking much. Heart: Tacky to 108. Chest: Short, shallow respirations but even breathing.  No adventitious sounds. Abdomen: Palpable, nontender hard bulge palpable halfway between the umbilicus and the xiphoid at the midline.  This is size between golf ball and lacrosse ball. Extremities: Slight pedal edema. Neuro/Psych: Lethargic.  Follows simple commands.  No tremors.  No facial asymmetry.  Intake/Output from previous day: 08/16 0701 - 08/17 0700 In: 810 [P.O.:610; IV Piggyback:200] Out: 1150 [Urine:1150]  Intake/Output this shift: Total I/O In: 120 [P.O.:120] Out: -   Lab Results: Recent Labs    03/12/2021 0643 03/21/21 0429 03/22/21 0226  WBC 147.4* 154.3* 204.4*  HGB  7.9* 8.0* 8.2*  HCT 24.2* 24.9* 25.7*  PLT 109* 97* 109*   BMET Recent Labs    03/17/2021 0643 03/21/21 0429 03/22/21 0226  NA 138 137 137  K 4.0 3.7 3.6  CL 105 101 100  CO2 '23 23 24  '$ GLUCOSE 86 47* 38*  BUN 82* 76* 77*  CREATININE 2.26* 1.99* 2.06*  CALCIUM 9.0 9.1 9.0   LFT No results for input(s): PROT, ALBUMIN, AST, ALT, ALKPHOS, BILITOT, BILIDIR, IBILI in the last 72 hours. PT/INR No results for input(s): LABPROT, INR in the last 72 hours. Hepatitis Panel No results for input(s): HEPBSAG, HCVAB, HEPAIGM, HEPBIGM in the last 72 hours.  Studies/Results: CT ABDOMEN PELVIS WO CONTRAST  Result Date: 03/22/2021 CLINICAL DATA:  Abdominal distension. EXAM: CT ABDOMEN AND PELVIS WITHOUT CONTRAST TECHNIQUE: Multidetector CT imaging of the abdomen and pelvis was performed following the standard protocol without IV contrast. COMPARISON:  Renal ultrasound, 03/16/2021. Chest radiograph, 03/17/2021. CT abdomen and pelvis, 05/13/2017. FINDINGS: Lower chest: Trace left pleural effusion. Bilateral dependent consolidation, favor atelectasis. Cardiomegaly. Aortic valve and descending thoracic aorta calcifications. Hepatobiliary: Normal noncontrast appearance. Status post cholecystectomy. No biliary dilatation. Pancreas: Atrophic. Spleen: Heterogeneous noncontrast appearance of spleen, with small foci of gas within the parenchyma (see key images. Splenomegaly, with spleen measuring up to 14 cm. Adrenals/Urinary Tract: Normal noncontrast appearance of the adrenal glands and kidneys. No renal calculi, or hydronephrosis. Bladder is unremarkable. Stomach/Bowel: Nonobstructive bowel. Capsule endoscopy camera with the cecum. Nondilated colon. Appendix is difficult to visualize. Vascular/Lymphatic: Aortic atherosclerosis. No enlarged  abdominal or pelvic lymph nodes. Reproductive: Calcified leiomyoma. Other: Moderate volume abdominopelvic ascites, new since renal ultrasound 03/16/2021. Musculoskeletal: Anasarca.   No acute osseous abnormality. IMPRESSION: 1. Interval marked enlargement of spleen, with foci of parenchymal gas. Differential diagnosis includes spontaneous splenic rupture versus superimposed infection. Findings are incompletely assessed on this noncontrast examination, recommend contrasted CT abdomen pelvis for further evaluation. 2. New moderate volume of abdominopelvic ascites. 3. Additional chronic and senescent changes, as above. These results were called by telephone at the time of interpretation on 03/22/2021 at 12:26 pm to patient's RN Elisabeth Cara), to be relayed to provider [facility] , who verbally acknowledged these results. Electronically Signed   By: Michaelle Birks M.D.   On: 03/22/2021 12:32    Scheduled Meds:  allopurinol  150 mg Oral Daily   apixaban  5 mg Oral BID   fluticasone furoate-vilanterol  1 puff Inhalation Daily   metoprolol tartrate  12.5 mg Oral BID   pantoprazole  40 mg Oral Daily   Continuous Infusions:  cefTRIAXone (ROCEPHIN)  IV Stopped (03/21/21 1620)   lactated ringers     lactated ringers     PRN Meds:.acetaminophen, albuterol, ondansetron (ZOFRAN) IV, senna  ASSESMENT:     Melena.  N/V.  03/26/2021 EGD: mild, non-bleeding gastritis.  Biopsies obtained: reactive gastropathy, no H Pylori.  VCE 8/15: no active bleeding, shallow ulcer at site of gastric biopsies.  Continues on Protonix 40 mg po/daily    ABL anemia. Hgb 6.3 >> 8.2   Constipation.  Colonoscopy 01/27/21: 3 subcentimeter tubular adenomas.  Nonbloody stools today.  No plans to repeat colonoscopy.    Chronic Eliquis, held but now resumed.      Myeloproliferative disease.  Chronic thrombocytopenia and anemia with significant leukocytosis.  WBCs have gone from the 140s, 150s up to 204K.  E. coli in urine.  Blood culture PCR detecting staph epidermidis, MRSA.  Day 2 Rocephin.  Thrombocytopenia.  Platelets 109, up from 97.    R/O splenic rupture.  Repeat CT scan pending   PLAN   Given question of splenic  rupture, hold Eliquis???  Continue Protonix daily.  If unable to take oral could switch over temporarily to IV.  GI signing off.  Call if reinvolvement needed.    Erika [name]  03/22/2021, 1:40 PM Phone [phone]

## 2021-03-22 NOTE — Progress Notes (Signed)
Pharmacy Antibiotic Note  Erika [name] is a 80 y.o. female with myeloproliferative disorder admitted on 03/14/2021 with sepsis (bld cx no growth, except 1/4 with Staph epidermidis, likely contaminant; pan-sensitive E coli UTI, treated with ceftriaxone). Rapid response was called today for lethargy and increased oxygen requirements; HR 131, RR 19, BP 67/45; pt rec'd fluid bolus and was transferred to ICU. Pharmacy is consulted for cefepime dosing for sepsis.  WBC 264.9, afebrile; Scr 2.06, CrCl 30.9 ml/min (renal function relatively stable over past 3 days)  Plan: Cefepime 2 gm IV Q 12 hrs Monitor WBC, temp, clinical improvement, renal function  Height: '5\' 7"'$  (170.2 cm) Weight: 125.3 kg (276 lb 3.8 oz) IBW/kg (Calculated) : 61.6  Temp (24hrs), Avg:97.5 F (36.4 C), Min:96.2 F (35.7 C), Max:98.4 F (36.9 C)  Recent Labs  Lab 03/17/21 0838 03/17/21 1530 03/17/21 1618 03/17/21 2028 03/31/2021 0441 03/19/21 0613 03/12/2021 0643 03/21/21 0429 03/22/21 0226 03/22/21 1550  WBC  --    < >  --    < >  --  156.8* 147.4* 154.3* 204.4* 264.9*  CREATININE  --   --  3.15*  --  3.00* 2.63* 2.26* 1.99* 2.06*  --   LATICACIDVEN 2.7*  --  2.9*  --   --   --   --   --   --   --    < > = values in this interval not displayed.     Estimated Creatinine Clearance: 30.9 mL/min (A) (by C-G formula based on SCr of 2.06 mg/dL (H)).    Allergies  Allergen Reactions   Prednisone Other (See Comments)    REACTION: Psychosis, talking out of head, insomnia   Seroquel [Quetiapine Fumarate] Nausea Only and Other (See Comments)    Caused very dry mouth    Antimicrobials this admission: Vancomycin 8/12 >> 8/13 Cefepime 8/12 >> 8/13 Ceftriaxone 8/11, 8/14 >> 8/17 Metronidazole IV 8/17 >>  Microbiology results: 8/11 Bcx X 2: 1/4 btls with Staph epidermidis 8/11 UCx: >100,000 colonies/ml E coli, pan sensitive  Thank you for allowing pharmacy to be a part of this patient's care.  Gillermina Hu,  PharmD, BCPS, [facility] Clinical Pharmacist

## 2021-03-22 NOTE — Progress Notes (Signed)
RN received a call from radiology to discuss CT results, on call provider paged and notified. Writer gave provider radiology's number to further discuss findings. Will continue to monitor.

## 2021-03-22 NOTE — Progress Notes (Signed)
Pt HR noted to sustain in the 140s, internal medicine team notified

## 2021-03-22 NOTE — Progress Notes (Signed)
OT Cancellation Note  Patient Details Name: Erika [name] MRN: [id number] DOB: 1941/07/01   Cancelled Treatment:    Reason Eval/Treat Not Completed: Medical issues which prohibited therapy. Per RN Tnai, hold pt until MD is able to come and evaluate. Pt HR is sustaining 130-140's while pt is supine resting in bed. Acute OT will follow up as time allows.   [name] H., OTR/L Acute Rehabilitation  David Towson Elane Yolanda Bonine 03/22/2021, 9:48 AM

## 2021-03-23 ENCOUNTER — Inpatient Hospital Stay (HOSPITAL_COMMUNITY): Payer: PPO

## 2021-03-23 DIAGNOSIS — N179 Acute kidney failure, unspecified: Secondary | ICD-10-CM

## 2021-03-23 DIAGNOSIS — K922 Gastrointestinal hemorrhage, unspecified: Secondary | ICD-10-CM

## 2021-03-23 DIAGNOSIS — J9602 Acute respiratory failure with hypercapnia: Secondary | ICD-10-CM

## 2021-03-23 DIAGNOSIS — A419 Sepsis, unspecified organism: Secondary | ICD-10-CM

## 2021-03-23 DIAGNOSIS — R6521 Severe sepsis with septic shock: Secondary | ICD-10-CM

## 2021-03-23 DIAGNOSIS — J9601 Acute respiratory failure with hypoxia: Secondary | ICD-10-CM | POA: Diagnosis not present

## 2021-03-23 DIAGNOSIS — D469 Myelodysplastic syndrome, unspecified: Secondary | ICD-10-CM

## 2021-03-23 LAB — CBC WITH DIFFERENTIAL/PLATELET
Abs Immature Granulocytes: 25.79 10*3/uL — ABNORMAL HIGH (ref 0.00–0.07)
Abs Immature Granulocytes: 0 10*3/uL (ref 0.00–0.07)
Basophils Absolute: 0.1 10*3/uL (ref 0.0–0.1)
Basophils Absolute: 0 10*3/uL (ref 0.0–0.1)
Basophils Relative: 0 %
Basophils Relative: 0 %
Eosinophils Absolute: 0.1 10*3/uL (ref 0.0–0.5)
Eosinophils Absolute: 0 10*3/uL (ref 0.0–0.5)
Eosinophils Relative: 0 %
Eosinophils Relative: 0 %
HCT: 24.6 % — ABNORMAL LOW (ref 36.0–46.0)
HCT: 21.9 % — ABNORMAL LOW (ref 36.0–46.0)
Hemoglobin: 7.7 g/dL — ABNORMAL LOW (ref 12.0–15.0)
Hemoglobin: 6.5 g/dL — CL (ref 12.0–15.0)
Immature Granulocytes: 8 %
Lymphocytes Relative: 0 %
Lymphocytes Relative: 1 %
Lymphs Abs: 0.8 10*3/uL (ref 0.7–4.0)
Lymphs Abs: 3.1 10*3/uL (ref 0.7–4.0)
MCH: 30.8 pg (ref 26.0–34.0)
MCH: 30.5 pg (ref 26.0–34.0)
MCHC: 31.3 g/dL (ref 30.0–36.0)
MCHC: 29.7 g/dL — ABNORMAL LOW (ref 30.0–36.0)
MCV: 98.4 fL (ref 80.0–100.0)
MCV: 102.8 fL — ABNORMAL HIGH (ref 80.0–100.0)
Monocytes Absolute: 10.8 10*3/uL — ABNORMAL HIGH (ref 0.1–1.0)
Monocytes Absolute: 9.2 10*3/uL — ABNORMAL HIGH (ref 0.1–1.0)
Monocytes Relative: 3 %
Monocytes Relative: 3 %
Neutro Abs: 286.9 10*3/uL — ABNORMAL HIGH (ref 1.7–7.7)
Neutro Abs: 294.8 10*3/uL — ABNORMAL HIGH (ref 1.7–7.7)
Neutrophils Relative %: 89 %
Neutrophils Relative %: 96 %
Platelets: 133 10*3/uL — ABNORMAL LOW (ref 150–400)
Platelets: 121 10*3/uL — ABNORMAL LOW (ref 150–400)
RBC: 2.5 MIL/uL — ABNORMAL LOW (ref 3.87–5.11)
RBC: 2.13 MIL/uL — ABNORMAL LOW (ref 3.87–5.11)
RDW: 19.1 % — ABNORMAL HIGH (ref 11.5–15.5)
RDW: 19.3 % — ABNORMAL HIGH (ref 11.5–15.5)
WBC: 324.5 10*3/uL (ref 4.0–10.5)
WBC: 307.1 10*3/uL (ref 4.0–10.5)
nRBC: 0 % (ref 0.0–0.2)
nRBC: 0 % (ref 0.0–0.2)

## 2021-03-23 LAB — POCT I-STAT 7, (LYTES, BLD GAS, ICA,H+H)
Acid-base deficit: 13 mmol/L — ABNORMAL HIGH (ref 0.0–2.0)
Acid-base deficit: 12 mmol/L — ABNORMAL HIGH (ref 0.0–2.0)
Bicarbonate: 15.5 mmol/L — ABNORMAL LOW (ref 20.0–28.0)
Bicarbonate: 15.8 mmol/L — ABNORMAL LOW (ref 20.0–28.0)
Calcium, Ion: 1.28 mmol/L (ref 1.15–1.40)
Calcium, Ion: 1.18 mmol/L (ref 1.15–1.40)
HCT: 31 % — ABNORMAL LOW (ref 36.0–46.0)
HCT: 36 % (ref 36.0–46.0)
Hemoglobin: 10.5 g/dL — ABNORMAL LOW (ref 12.0–15.0)
Hemoglobin: 12.2 g/dL (ref 12.0–15.0)
O2 Saturation: 95 %
O2 Saturation: 84 %
Patient temperature: 98
Patient temperature: 98
Potassium: 4.9 mmol/L (ref 3.5–5.1)
Potassium: 4.6 mmol/L (ref 3.5–5.1)
Sodium: 136 mmol/L (ref 135–145)
Sodium: 135 mmol/L (ref 135–145)
TCO2: 17 mmol/L — ABNORMAL LOW (ref 22–32)
TCO2: 17 mmol/L — ABNORMAL LOW (ref 22–32)
pCO2 arterial: 46.3 mmHg (ref 32.0–48.0)
pCO2 arterial: 39.4 mmHg (ref 32.0–48.0)
pH, Arterial: 7.131 — CL (ref 7.350–7.450)
pH, Arterial: 7.208 — ABNORMAL LOW (ref 7.350–7.450)
pO2, Arterial: 95 mmHg (ref 83.0–108.0)
pO2, Arterial: 58 mmHg — ABNORMAL LOW (ref 83.0–108.0)

## 2021-03-23 LAB — GLUCOSE, CAPILLARY
Glucose-Capillary: 116 mg/dL — ABNORMAL HIGH (ref 70–99)
Glucose-Capillary: 143 mg/dL — ABNORMAL HIGH (ref 70–99)
Glucose-Capillary: 26 mg/dL — CL (ref 70–99)
Glucose-Capillary: 34 mg/dL — CL (ref 70–99)
Glucose-Capillary: 63 mg/dL — ABNORMAL LOW (ref 70–99)
Glucose-Capillary: 73 mg/dL (ref 70–99)
Glucose-Capillary: 73 mg/dL (ref 70–99)
Glucose-Capillary: 84 mg/dL (ref 70–99)
Glucose-Capillary: 89 mg/dL (ref 70–99)

## 2021-03-23 LAB — RASBURICASE - URIC ACID: Uric Acid, Serum: 8.3 mg/dL — ABNORMAL HIGH (ref 2.5–7.1)

## 2021-03-23 LAB — COMPREHENSIVE METABOLIC PANEL
ALT: 18 U/L (ref 0–44)
AST: 33 U/L (ref 15–41)
Albumin: 2.4 g/dL — ABNORMAL LOW (ref 3.5–5.0)
Alkaline Phosphatase: 310 U/L — ABNORMAL HIGH (ref 38–126)
Anion gap: 19 — ABNORMAL HIGH (ref 5–15)
BUN: 79 mg/dL — ABNORMAL HIGH (ref 8–23)
CO2: 19 mmol/L — ABNORMAL LOW (ref 22–32)
Calcium: 9.5 mg/dL (ref 8.9–10.3)
Chloride: 97 mmol/L — ABNORMAL LOW (ref 98–111)
Creatinine, Ser: 2.73 mg/dL — ABNORMAL HIGH (ref 0.44–1.00)
GFR, Estimated: 17 mL/min — ABNORMAL LOW (ref >60)
Glucose, Bld: 94 mg/dL (ref 70–99)
Potassium: 5 mmol/L (ref 3.5–5.1)
Sodium: 135 mmol/L (ref 135–145)
Total Bilirubin: 1.3 mg/dL — ABNORMAL HIGH (ref 0.3–1.2)
Total Protein: 5.6 g/dL — ABNORMAL LOW (ref 6.5–8.1)

## 2021-03-23 LAB — LACTIC ACID, PLASMA
Lactic Acid, Venous: 3.6 mmol/L (ref 0.5–1.9)
Lactic Acid, Venous: >11 mmol/L (ref 0.5–1.9)
Lactic Acid, Venous: 11 mmol/L (ref 0.5–1.9)

## 2021-03-23 LAB — PROCALCITONIN: Procalcitonin: 14.45 ng/mL

## 2021-03-23 LAB — TROPONIN I (HIGH SENSITIVITY)
Troponin I (High Sensitivity): 71 ng/L — ABNORMAL HIGH (ref <18)
Troponin I (High Sensitivity): 210 ng/L (ref <18)

## 2021-03-23 LAB — APTT
aPTT: 116 seconds — ABNORMAL HIGH (ref 24–36)
aPTT: 123 seconds — ABNORMAL HIGH (ref 24–36)
aPTT: 148 seconds — ABNORMAL HIGH (ref 24–36)

## 2021-03-23 LAB — PREPARE RBC (CROSSMATCH)

## 2021-03-23 LAB — HEPARIN LEVEL (UNFRACTIONATED): Heparin Unfractionated: >1.1 IU/mL — ABNORMAL HIGH (ref 0.30–0.70)

## 2021-03-23 LAB — MAGNESIUM
Magnesium: 1.9 mg/dL (ref 1.7–2.4)
Magnesium: 1.9 mg/dL (ref 1.7–2.4)

## 2021-03-23 LAB — PHOSPHORUS: Phosphorus: 7.5 mg/dL — ABNORMAL HIGH (ref 2.5–4.6)

## 2021-03-23 MED ORDER — CHLORHEXIDINE GLUCONATE 0.12% ORAL RINSE (MEDLINE KIT): 15.0000 mL | Freq: Two times a day (BID) | OROMUCOSAL | Status: DC

## 2021-03-23 MED ORDER — VASOPRESSIN 20 UNITS/100 ML INFUSION FOR SHOCK
0.0000 [IU]/min | INTRAVENOUS | Status: DC
  Administered 2021-03-23 – 2021-03-24 (×4): 0.03 [IU]/min via INTRAVENOUS
  Filled 2021-03-23 (×4): qty 100

## 2021-03-23 MED ORDER — ETOMIDATE 2 MG/ML IV SOLN
INTRAVENOUS | Status: AC
  Administered 2021-03-23: 10 mg via INTRAVENOUS
  Filled 2021-03-23: qty 20

## 2021-03-23 MED ORDER — ROCURONIUM BROMIDE 10 MG/ML (PF) SYRINGE
PREFILLED_SYRINGE | INTRAVENOUS | Status: AC
  Administered 2021-03-23: 100 mg via INTRAVENOUS
  Filled 2021-03-23: qty 10

## 2021-03-23 MED ORDER — CALCIUM CHLORIDE 10 % IV SOLN
INTRAVENOUS | Status: AC
  Administered 2021-03-23: 1 g via INTRAVENOUS
  Filled 2021-03-23: qty 10

## 2021-03-23 MED ORDER — ETOMIDATE 2 MG/ML IV SOLN: 10.0000 mg | Freq: Once | INTRAVENOUS | Status: AC

## 2021-03-23 MED ORDER — DEXTROSE 50 % IV SOLN: 12.5000 g | INTRAVENOUS | Status: AC

## 2021-03-23 MED ORDER — LACTATED RINGERS IV BOLUS
1000.0000 mL | Freq: Once | INTRAVENOUS | Status: AC
  Administered 2021-03-23: 1000 mL via INTRAVENOUS

## 2021-03-23 MED ORDER — PHENYLEPHRINE HCL-NACL 20-0.9 MG/250ML-% IV SOLN
0.0000 ug/min | INTRAVENOUS | Status: DC
  Administered 2021-03-23: 20 ug/min via INTRAVENOUS
  Filled 2021-03-23: qty 500

## 2021-03-23 MED ORDER — SODIUM BICARBONATE 8.4 % IV SOLN
100.0000 meq | Freq: Once | INTRAVENOUS | Status: AC
  Administered 2021-03-23: 100 meq via INTRAVENOUS
  Filled 2021-03-23: qty 100
  Filled 2021-03-23: qty 50

## 2021-03-23 MED ORDER — ROCURONIUM BROMIDE 50 MG/5ML IV SOLN: 100.0000 mg | Freq: Once | INTRAVENOUS | Status: AC

## 2021-03-23 MED ORDER — PHENYLEPHRINE CONCENTRATED 100MG/250ML (0.4 MG/ML) INFUSION SIMPLE
0.0000 ug/min | INTRAVENOUS | Status: DC
  Administered 2021-03-23: 100 ug/min via INTRAVENOUS
  Administered 2021-03-23 (×3): 400 ug/min via INTRAVENOUS
  Administered 2021-03-23: 350 ug/min via INTRAVENOUS
  Administered 2021-03-24 (×3): 400 ug/min via INTRAVENOUS
  Filled 2021-03-23 (×10): qty 250

## 2021-03-23 MED ORDER — DILTIAZEM HCL-DEXTROSE 125-5 MG/125ML-% IV SOLN (PREMIX)
10.0000 mg/h | INTRAVENOUS | Status: DC
  Administered 2021-03-23: 5 mg/h via INTRAVENOUS
  Filled 2021-03-23: qty 125

## 2021-03-23 MED ORDER — ORAL CARE MOUTH RINSE: 15.0000 mL | OROMUCOSAL | Status: DC

## 2021-03-23 MED ORDER — CALCIUM CHLORIDE 10 % IV SOLN: 1.0000 g | Freq: Once | INTRAVENOUS | Status: AC

## 2021-03-23 MED ORDER — CHLORHEXIDINE GLUCONATE 0.12% ORAL RINSE (MEDLINE KIT)
15.0000 mL | Freq: Two times a day (BID) | OROMUCOSAL | Status: DC
  Administered 2021-03-23 – 2021-03-24 (×2): 15 mL via OROMUCOSAL

## 2021-03-23 MED ORDER — SODIUM BICARBONATE 8.4 % IV SOLN
50.0000 meq | Freq: Once | INTRAVENOUS | Status: AC
  Administered 2021-03-23: 50 meq via INTRAVENOUS

## 2021-03-23 MED ORDER — SODIUM CHLORIDE 0.9% IV SOLUTION: Freq: Once | INTRAVENOUS | Status: AC

## 2021-03-23 MED ORDER — SODIUM BICARBONATE 8.4 % IV SOLN: 100.0000 meq | Freq: Once | INTRAVENOUS | Status: AC

## 2021-03-23 MED ORDER — SODIUM CHLORIDE 0.9 % IV SOLN
2.0000 g | INTRAVENOUS | Status: DC
  Administered 2021-03-24: 2 g via INTRAVENOUS
  Filled 2021-03-23: qty 2

## 2021-03-23 MED ORDER — SODIUM BICARBONATE 8.4 % IV SOLN
INTRAVENOUS | Status: AC
  Filled 2021-03-23: qty 50

## 2021-03-23 MED ORDER — DEXTROSE 10 % IV SOLN: INTRAVENOUS | Status: DC

## 2021-03-23 MED ORDER — DEXMEDETOMIDINE HCL IN NACL 400 MCG/100ML IV SOLN
0.4000 ug/kg/h | INTRAVENOUS | Status: DC
Start: 2021-03-23 — End: 2021-03-25
  Administered 2021-03-23: 0.5 ug/kg/h via INTRAVENOUS
  Administered 2021-03-23 (×2): 0.4 ug/kg/h via INTRAVENOUS
  Administered 2021-03-24: 0.5 ug/kg/h via INTRAVENOUS
  Filled 2021-03-23 (×5): qty 100

## 2021-03-23 MED ORDER — PANTOPRAZOLE SODIUM 40 MG IV SOLR
40.0000 mg | INTRAVENOUS | Status: DC
  Administered 2021-03-23 – 2021-03-24 (×2): 40 mg via INTRAVENOUS
  Filled 2021-03-23 (×2): qty 40

## 2021-03-23 MED ORDER — DEXTROSE 50 % IV SOLN
INTRAVENOUS | Status: AC
  Administered 2021-03-23: 50 mL via INTRAVENOUS
  Filled 2021-03-23: qty 100

## 2021-03-23 MED ORDER — DEXTROSE 50 % IV SOLN
INTRAVENOUS | Status: AC
  Administered 2021-03-23: 12.5 g via INTRAVENOUS
  Filled 2021-03-23: qty 50

## 2021-03-23 MED ORDER — DEXTROSE 50 % IV SOLN: 50.0000 mL | Freq: Once | INTRAVENOUS | Status: AC

## 2021-03-23 MED ORDER — ORAL CARE MOUTH RINSE
15.0000 mL | OROMUCOSAL | Status: DC
  Administered 2021-03-23 – 2021-03-24 (×12): 15 mL via OROMUCOSAL

## 2021-03-23 MED ORDER — HEPARIN (PORCINE) 25000 UT/250ML-% IV SOLN: 850.0000 [IU]/h | INTRAVENOUS | Status: DC

## 2021-03-23 MED ORDER — SODIUM BICARBONATE 8.4 % IV SOLN
INTRAVENOUS | Status: AC
  Administered 2021-03-23: 100 meq via INTRAVENOUS
  Filled 2021-03-23: qty 100

## 2021-03-23 MED ORDER — KETAMINE HCL 50 MG/5ML IJ SOSY
PREFILLED_SYRINGE | INTRAMUSCULAR | Status: AC
  Filled 2021-03-23: qty 5

## 2021-03-23 NOTE — Progress Notes (Signed)
[facility] Progress Note Patient Name: Erika [name] DOB: 18-Mar-1941 MRN: [id number]   Date of Service  03/23/2021  HPI/Events of Note  ABG on 80%/PRVC 24/TV 24/P 5 = 7.208/39.9/48. However, sat now = 97%.  eICU Interventions  Plan: NaHCO3 100 meq IV now. Repeat ABG at 5 AM.     Intervention Category Major Interventions: Acid-Base disturbance - evaluation and management;Respiratory failure - evaluation and management  Lysle Dingwall 03/23/2021, 9:06 PM

## 2021-03-23 NOTE — Progress Notes (Signed)
Pharmacy Antibiotic Note  Erika [name] is a 80 y.o. female with myeloproliferative disorder admitted on 04/03/2021 with sepsis (bld cx no growth, except 1/4 with Staph epidermidis, likely contaminant; pan-sensitive E coli UTI, treated with ceftriaxone). Rapid response was called 8/17 for lethargy and increased oxygen requirements; HR 131, RR 19, BP 67/45; pt rec'd fluid bolus and was transferred to ICU. 8/18 patient was intubated with increased pressor requirements. Pharmacy is consulted for cefepime dosing for sepsis.  WBC 324.5 (myeloproliferative neoplasia), afebrile; Scr 2.73 , CrCl 23.67m/min   Plan: Cefepime 2 gm IV Q 24 hrs (adjusted for renal function)  Flagyl 500 mg IV q8H   Monitor WBC, temp, clinical improvement, renal function  Height: '5\' 7"'$  (170.2 cm) Weight: 125.3 kg (276 lb 3.8 oz) IBW/kg (Calculated) : 61.6  Temp (24hrs), Avg:97.2 F (36.2 C), Min:96.2 F (35.7 C), Max:97.7 F (36.5 C)  Recent Labs  Lab 03/17/21 0838 03/17/21 1530 03/17/21 1618 03/17/21 2028 03/19/21 0613 03/28/2021 0643 03/21/21 0429 03/22/21 0226 03/22/21 1550 03/22/21 1819 03/22/21 1950 03/22/21 2228 03/23/21 0440  WBC  --    < >  --    < > 156.8* 147.4* 154.3* 204.4* 264.9*  --   --   --  324.5*  CREATININE  --   --  3.15*   < > 2.63* 2.26* 1.99* 2.06*  --   --   --   --  2.73*  LATICACIDVEN 2.7*  --  2.9*  --   --   --   --   --   --  3.3* 3.3* 3.6*  --    < > = values in this interval not displayed.     Estimated Creatinine Clearance: 23.4 mL/min (A) (by C-G formula based on SCr of 2.73 mg/dL (H)).    Allergies  Allergen Reactions   Prednisone Other (See Comments)    REACTION: Psychosis, talking out of head, insomnia   Seroquel [Quetiapine Fumarate] Nausea Only and Other (See Comments)    Caused very dry mouth    Antimicrobials this admission: Vancomycin 8/12 >> 8/13 Cefepime 8/12 >> 8/13, 8/17>> Ceftriaxone 8/11, 8/14 >> 8/17 Metronidazole IV 8/17 >>  Microbiology  results: 8/11 Bcx X 2: 1/4 btls with Staph epidermidis 8/11 UCx: >100,000 colonies/ml E coli, pan sensitive 8/17 Bcx: ngtd   Thank you for allowing pharmacy to be a part of this patient's care.  AAdria Dill PharmD PGY-1 Acute Care Resident  03/23/2021 9:11 AM

## 2021-03-23 NOTE — Progress Notes (Signed)
[place] for heparin Indication: Splenic vein thrombosis 03/22/21  Allergies  Allergen Reactions   Prednisone Other (See Comments)    REACTION: Psychosis, talking out of head, insomnia   Seroquel [Quetiapine Fumarate] Nausea Only and Other (See Comments)    Caused very dry mouth    Patient Measurements: Height: '5\' 7"'$  (170.2 cm) Weight: 125.3 kg (276 lb 3.8 oz) IBW/kg (Calculated) : 61.6 Heparin Dosing Weight: 96kg  Vital Signs: Temp: 97.5 F (36.4 C) (08/17 2316) Temp Source: Axillary (08/17 1942) Pulse Rate: 145 (08/18 0202)  Labs: Recent Labs    03/13/2021 0643 03/21/21 0429 03/22/21 0226 03/22/21 1550 03/22/21 1618 03/23/21 0440  HGB 7.9* 8.0* 8.2* 7.6* 10.9*  --   HCT 24.2* 24.9* 25.7* 24.8* 32.0*  --   PLT 109* 97* 109* 114*  --   --   APTT  --   --   --   --   --  116*  CREATININE 2.26* 1.99* 2.06*  --   --   --      Estimated Creatinine Clearance: 30.9 mL/min (A) (by C-G formula based on SCr of 2.06 mg/dL (H)).   Medical History: Past Medical History:  Diagnosis Date   Anemia    with menses   Asthma    Atrial flutter, paroxysmal ([place])    Back pain    status post surgery 2002   Blood transfusion without reported diagnosis    Breast cyst    Excesion with FNA, begnin in 2004.    CHF (congestive heart failure) (HCC)    Degenerative joint disease of spine    Imaging 2005,  Degenerative hypertrophic facet arthritis changes L4-5 and L5-S1.[name]    History of shingles    Recurrent with post herpetic neuralgia.    Hypertension    Lymphadenopathy    Of the mediastinum, Right side CXR 2008, not read on 2010 cxr.    Menopause    Myeloproliferative neoplasm ([place])    Obesity    BMI 54   Psychosis ([place])    Secondary to prednisone.   Shingles    Stasis dermatitis    W/ LE edema, prviously on lasix now on mazxide.    SVT (supraventricular tachycardia) ([place]) 01/2008   one run while hospitalized   Tuberculosis    active  TB treated in 2002, hx of paraspinal lumbar TB,      Assessment: 80 yo W with acute splenic vein thrombosis 03/22/21. On apixaban for afib/flutter prior to admission, which was held 8/10-8/15 for anemia and hematochezia, restarted on 8/16, last dose 8/17 at 9 AM.  Pharmacy consulted for heparin.    Heparin level likely affected by apixaban doses but not done this a.m, aPTT 116 sec (supratherapeutic) on gtt at 1450 units/hr.   Goal of Therapy:  Heparin level 0.3-0.7 units/ml Monitor platelets by anticoagulation protocol: Yes   Plan:  Decrease heparin to 1250 units/hr F/u APTT and heparin level in 8 hr   Sherlon Handing, PharmD, BCPS Please see amion for complete clinical pharmacist phone list 03/23/2021 5:44 AM

## 2021-03-23 NOTE — Progress Notes (Addendum)
ANTICOAGULATION CONSULT NOTE  Pharmacy Consult for heparin Indication: Splenic vein thrombosis 03/22/21  Allergies  Allergen Reactions   Prednisone Other (See Comments)    REACTION: Psychosis, talking out of head, insomnia   Seroquel [Quetiapine Fumarate] Nausea Only and Other (See Comments)    Caused very dry mouth    Patient Measurements: Height: '5\' 7"'$  (170.2 cm) Weight: 125.3 kg (276 lb 3.8 oz) IBW/kg (Calculated) : 61.6 Heparin Dosing Weight: 96kg  Vital Signs: Temp: 98.1 F (36.7 C) (08/18 1404) Temp Source: Oral (08/18 1404) BP: 94/42 (08/18 1404) Pulse Rate: 133 (08/18 1404)  Labs: Recent Labs    03/22/21 0226 03/22/21 1550 03/22/21 1618 03/23/21 0440 03/23/21 1028 03/23/21 1052  HGB 8.2* 7.6*   < > 7.7* 10.5* 6.5*  HCT 25.7* 24.8*   < > 24.6* 31.0* 21.9*  PLT 109* 114*  --  133*  --  121*  APTT  --   --   --  116*  --  123*  CREATININE 2.06*  --   --  2.73*  --  2.90*  TROPONINIHS  --   --   --   --   --  71*   < > = values in this interval not displayed.     Estimated Creatinine Clearance: 22 mL/min (A) (by C-G formula based on SCr of 2.9 mg/dL (H)).   Medical History: Past Medical History:  Diagnosis Date   Anemia    with menses   Asthma    Atrial flutter, paroxysmal ([place])    Back pain    status post surgery 2002   Blood transfusion without reported diagnosis    Breast cyst    Excesion with FNA, begnin in 2004.    CHF (congestive heart failure) (HCC)    Degenerative joint disease of spine    Imaging 2005,  Degenerative hypertrophic facet arthritis changes L4-5 and L5-S1.[name]    History of shingles    Recurrent with post herpetic neuralgia.    Hypertension    Lymphadenopathy    Of the mediastinum, Right side CXR 2008, not read on 2010 cxr.    Menopause    Myeloproliferative neoplasm ([place])    Obesity    BMI 54   Psychosis ([place])    Secondary to prednisone.   Shingles    Stasis dermatitis    W/ LE edema, prviously on lasix now on mazxide.     SVT (supraventricular tachycardia) ([place]) 01/2008   one run while hospitalized   Tuberculosis    active TB treated in 2002, hx of paraspinal lumbar TB,      Assessment: 80 yo W with acute splenic vein thrombosis 03/22/21. On apixaban for afib/flutter prior to admission, which was held 8/10-8/15 for anemia and hematochezia, restarted on 8/16, last dose 8/17 at 9 AM. Pt underwent EGD 8/13. Pharmacy consulted for heparin dosing.   Pt transferred to the ICU 8/17 PM for worsening AMS, hypotension and respiratory distress. 8/18 patient has had increasing pressor requirements secondary to septic shock. Notably patient has worsening organ dysfunction with a history of cirrhosis, which may be falsely elevating her aPTT levels and contributing to low H/H. Hgb decreased from 10.4>7.7>6.5 with no signs of bleeding noted. Hemodilution may also be contributing to Hgb decrease based on volume of pressors being administered. Discussed the case with Dr. Tacy Learn, and he is ok with continuing heparin as the Hgb decrease is most likely d/t septic shock and not active GIB. PLT have remained stable.   8/18 HL >  1.1 (supratherapeutic) and is not correlating with aPTT (148). Will continue to titrate based off of aPTT.    Goal of Therapy:  Heparin level 0.3-0.7 units/ml Monitor platelets by anticoagulation protocol: Yes   Plan:  Hold heparin infusion 60 minutes (until 1600 8/18)   Decrease heparin to 850 units/h (starting at 1600 8/18)  F/u APTT in 8 hr from start of infusion   Adria Dill, PharmD PGY-1 Acute Care Resident  03/23/2021 2:55 PM

## 2021-03-23 NOTE — Procedures (Signed)
Intubation Procedure Note  Erika [name]  [id number]  1941-04-02  Date:03/23/21  Time:10:11 AM   Provider Performing:Jobany Montellano    Procedure: Intubation (31500)  Indication(s) Respiratory Failure  Consent Risks of the procedure as well as the alternatives and risks of each were explained to the patient and/or caregiver.  Consent for the procedure was obtained and is signed in the bedside chart   Anesthesia Etomidate and Rocuronium   Time Out Verified patient identification, verified procedure, site/side was marked, verified correct patient position, special equipment/implants available, medications/allergies/relevant history reviewed, required imaging and test results available.   Sterile Technique Usual hand hygeine, masks, and gloves were used   Procedure Description Patient positioned in bed supine.  Sedation given as noted above.  Patient was intubated with endotracheal tube using  D/L MAC4 .  View was Grade 1 full glottis .  Number of attempts was 1.  Colorimetric CO2 detector was consistent with tracheal placement.   Complications/Tolerance None; patient tolerated the procedure well. Chest X-ray is ordered to verify placement.   EBL Minimal   Specimen(s) None

## 2021-03-23 NOTE — Plan of Care (Signed)
Updated patient's another son [name],, he understood that patient's condition is not improving despite maximal medical therapy. During our conversation, patient was awake and engaged in our discussion. Patient and her family were clear that she does not want to be resuscitated in the event of cardiac arrest.  DNR orders were written.   Jacky Kindle MD  Pulmonary Critical Care See Amion for pager If no response to pager, please call [phone] until 7pm After 7pm, Please call E-link [phone]

## 2021-03-23 NOTE — Progress Notes (Signed)
[facility] Progress Note Patient Name: Erika [name] DOB: 1941-05-30 MRN: [id number]   Date of Service  03/23/2021  HPI/Events of Note  Hypotension - BP = 125/52 with MAP = 75 on Norepinephrine, Phenylephrine and Vasopressin IV infusions at maximal doses. Getting second unit of PRBC. Also Blood glucose 63. Nurse giving D50 1/2 amp IV now.   eICU Interventions  Plan: Monitor CVP now and Q 4 hours. H/H post 2nd unit PRVC. Increase ceiling on Norepinephrine IV infusion to 80 mcg/min. ABG STAT. Increase D10W IV infusion to 80 mL/hour.     Intervention Category Major Interventions: Hypotension - evaluation and management  Erika [name] 03/23/2021, 8:05 PM

## 2021-03-23 NOTE — Progress Notes (Signed)
PT Cancellation Note  Patient Details Name: Erika [name] MRN: [id number] DOB: [birth year]   Cancelled Treatment:    Reason Eval/Treat Not Completed: Medical issues which prohibited therapy  Patient moved to ICU, now on BiPAP with resting HR 130s. PT deferred at this time. Will follow for appropriateness to resume therapies.    Arby Barrette, PT Pager [phone]  Rexanne Mano 03/23/2021, 8:27 AM

## 2021-03-23 NOTE — Progress Notes (Signed)
NAME:  Erika [name], MRN:  [id number], DOB:  Jun 08, 1941, LOS: 7 ADMISSION DATE:  03/19/2021, CONSULTATION DATE:  03/22/2021 REFERRING MD:  Dr. Daryll Drown, CHIEF COMPLAINT:  AMS, Hypotension, Leukocytosis   History of Present Illness:  80 y/o F who presented to [facility] ER on 8/10 with reports of weakness, LE swelling and bloody stool.     She recently had a polypectomy January 27, 2021 and has been on chronic anticoagulation.  After that she noted blood in her stools.  She reported bleeding for one month prior to admit. She reported difficulty walking on admit, decline in her ability to perform self care, shortness of breath.   Initial work up was concerning for possible GIB with hematochezia.  FOBT positive. On admit, she was noted to have had a 20 lb increase over the last 3 months and she was diuresed.  She had AKI and was diuresed.  UA was concerning for possible UTI.  Renal US was performed & consistent with medical renal disease.  Hgb on presentation was 6.3 requiring transfusion. She was evaluated by GI, anticoagulation was held, PPI IV and serial H/H.  She was pan cultured with blood cultures showing staph epi and GNR.  Urine culture grew out pan-sensitive E-Coli.  She was treated accordingly. She underwent EGD on 8/13 with findings of a normal esophagus, non-ulcerated / non-bleeding gastritis with mild oozing from biopsy sites.  Capsule endoscopy was started on 8/15 with no acute bleeding.  She developed a palpable bulge in her abdomen on 8/17, hypotension and AMS. CT of the abdomen without contrast showed interval marked enlargement of the spleen with foci of parenchymal gas, moderate volume of abdominopelvic ascites.  PCCM called 8/17 for evaluation for hypotension.  She was given IVF for hypotension, glucose for low glucose reading on BMP's. Levophed was added for persistent hypotension.  She was evaluated by General Surgery and felt not to have acute surgical needs.  Follow up imaging of the abdomen with  contrast showed persistent enlarged spleen with patchy areas of enhancement, small foci of air and changes consistent with splenic vein thrombosis extending from the splenic hilum to within 3 cm of the spleno portal confluence.  This was reviewed again with surgery and no acute current surgical needs.   Pertinent  Medical History  Myeloproliferative Neoplasm  Atrial Flutter - on Eliquis HFpEF  Anemia  CKD IIIb Paraspinal Lumbar TB  Significant Hospital Events: Including procedures, antibiotic start and stop dates in addition to other pertinent events   8/10 admit 8/17 CT w/ contrast splenomegaly with splenic vein thrombosis 8/17 PCCM consulted for hypotension, AMS, anemia 8/18 Patient intubated 8/18 increasing pressor requirement  Interim History / Subjective:  Overnight, patient with lactic acid 3.3 was given 250 cc NS IVF. Patient later found to be in atrial flutter (chronic medical problem) and was started on cardizem 5 mg/hr.  This morning, patient with difficulty protecting away in setting of shock/AMS was intubated.  Family members called to discuss goals of care this morning. Discussed with family that patient has grim prognosis with shock effecting multiple organ systems with end organ damage.  Objective   Blood pressure (!) 92/55, pulse (!) 149, temperature (!) 97.5 F (36.4 C), temperature source Axillary, resp. rate (!) 24, height '5\' 7"'$  (1.702 m), weight 125.3 kg, SpO2 99 %. CVP:  [17 mmHg] 17 mmHg  Vent Mode: PRVC FiO2 (%):  [50 %-100 %] 80 % Set Rate:  [15 bmp-24 bmp] 24 bmp PEEP:  [5  cmH20-6 cmH20] 5 cmH20 Plateau Pressure:  [28 cmH20] 28 cmH20   Intake/Output Summary (Last 24 hours) at 03/23/2021 1135 Last data filed at 03/23/2021 0801 Gross per 24 hour  Intake 2411.71 ml  Output 150 ml  Net 2261.71 ml   Filed Weights   03/07/2021 0400 03/21/21 0622 03/22/21 0419  Weight: 132.6 kg 128.4 kg 125.3 kg    Examination: General: severely ill-appearing elderly  female lying in bed on mechanical ventilation. HENT: ETT in place. MM pink/moist, missing teeth, anicteric. Lungs: non-labored at rest, lungs bilaterally diminished but good air movement  Cardiovascular: s1 normal, s2 normal, sinus tachycardic on telemetry, normal rhythm. Abdomen: soft, non-tender, protuberant, bsx4 hypoactive. Periumbilical, reducible abdominal mass. Extremities: warm, dry, BLE edema  Neuro: sedated on mechanical vent.  Resolved Hospital Problem list     Assessment & Plan:   Septic Shock E-Coli UTI  Etiology splenic infection vs aspiration pneumonia. Patient hypotensive this am, requiring increased pressor support. Bedside US revealed marked dilation of right ventricle, with hyperdynamic left ventricle. Patient received 1L LR this morning in setting of hypotension. Per exam and chest xray, it appears patient is fluid overloaded and third spacing volume with worsening opacity over right lung field and ascitics within abdomen. Patient on cefepime and flagyl. Requiring vasopressin and levophed for pressure support. Lactic acid elevated at 3.3, now 11 despite numerous interventions. Prognosis is poor. Patient with sepsis with signs of end organ damage. We will continue to provide optimal supportive care. - cont cefepime + flagyl  - f/u blood cultures - f/u lactic acid - monitor vitals  Acute Metabolic Encephalopathy Metabolic Acidosis Likely related to infection/septic shock. Patient with metabolic acidosis. Gave 2 units of bicarb to attempt to correct. Lactic acid now at 11 which would explain metabolic acidosis. Will continue supportive care as above. - 2 units of bicarb - Reassess ABG  Acute Hypercarbic Respiratory Failure Aspiration Pneumonia Suspect initially in setting of obesity, acute illness. Interval worsening in respiratory status requiring intubation 2/2 to acute encephalopathy in setting of septic shock as described above. Patient now intubated. Already on abx  for potential infection of spleen. Will continue abx therapy as will cover for suspected aspiration pneumonia. - mechanical ventilation; LTVV - VAP protocol  Splenic vein thrombosis w/ splenomegaly c/f infection vs infarction Suspect etiology related to hypercoagulable state with myeloproliferative disorder. CT illustrating splenic vein thrombosis with splenomegaly and paranchymal gas within spleen c/f infarction vs infection. Patient started on cefepime and flagyl. Heparin anticoagulation started. Surgery consulted and do no think surgery is indicated at this time. Recommended supportive care. Will continue abx and anticoagulation. - Continue cefepime and flagyl - Heparin for anticoagulation - dosing per pharmacy  Profound Leukocytosis / Myeloproliferative Disorder  Anemia  Oncology consulted > no acute treatment recommended at this time.  Suspect reactive process to acute illness.  - follow CBC assess uric acid     AKI on CKD IIIb -Trend BMP / urinary output -Replace electrolytes as indicated -Avoid nephrotoxic agents.     Best Practice (right click and "Reselect all SmartList Selections" daily)   Diet/type: NPO DVT prophylaxis: systemic heparin GI prophylaxis: PPI Lines: Central line and Arterial Line Foley:  Yes, and it is still needed Code Status:  full code Last date of multidisciplinary goals of care discussion [8/18; spoke with family about the patient's poor prognosis given current clinical status. See Dr. Jacky Kindle, MD note.]  Labs   CBC: Recent Labs  Lab 03/23/2021 [phone] 03/21/21 0429 03/22/21 [id number]  03/22/21 1550 03/22/21 1618 03/23/21 0440 03/23/21 1028  WBC 147.4* 154.3* 204.4* 264.9*  --  324.5*  --   NEUTROABS  --   --   --  259.6*  --  286.9*  --   HGB 7.9* 8.0* 8.2* 7.6* 10.9* 7.7* 10.5*  HCT 24.2* 24.9* 25.7* 24.8* 32.0* 24.6* 31.0*  MCV 93.8 95.0 96.6 98.4  --  98.4  --   PLT 109* 97* 109* 114*  --  133*  --     Basic Metabolic Panel: Recent Labs   Lab 03/17/21 0217 03/17/21 1618 03/19/21 [id number] 03/14/2021 0643 03/21/21 0429 03/22/21 0226 03/22/21 1618 03/23/21 0440 03/23/21 1028  NA 138   < > 136 138 137 137 135 135 136  K 5.3*   < > 4.8 4.0 3.7 3.6 4.4 5.0 4.9  CL 107   < > 105 105 101 100  --  97*  --   CO2 17*   < > 20* '23 23 24  '$ --  19*  --   GLUCOSE 39*   < > 81 86 47* 38*  --  94  --   BUN 102*   < > 91* 82* 76* 77*  --  79*  --   CREATININE 3.19*   < > 2.63* 2.26* 1.99* 2.06*  --  2.73*  --   CALCIUM 8.9   < > 8.9 9.0 9.1 9.0  --  9.5  --   MG 2.1  --   --   --   --   --   --  1.9  --   PHOS 5.9*  --   --   --   --   --   --   --   --    < > = values in this interval not displayed.   GFR: Estimated Creatinine Clearance: 23.4 mL/min (A) (by C-G formula based on SCr of 2.73 mg/dL (H)). Recent Labs  Lab 03/17/21 1618 03/17/21 2028 03/21/21 0429 03/22/21 0226 03/22/21 1550 03/22/21 1819 03/22/21 1950 03/22/21 2228 03/23/21 0440  PROCALCITON  --   --   --   --   --  8.97  --   --  14.45  WBC  --    < > 154.3* 204.4* 264.9*  --   --   --  324.5*  LATICACIDVEN 2.9*  --   --   --   --  3.3* 3.3* 3.6*  --    < > = values in this interval not displayed.    Liver Function Tests: Recent Labs  Lab 03/17/21 0838 03/19/21 0613 03/23/21 0440  AST 26 26 33  ALT '18 17 18  '$ ALKPHOS 342* 309* 310*  BILITOT 0.7 1.4* 1.3*  PROT 6.3* 6.1* 5.6*  ALBUMIN 3.1* 3.0* 2.4*   No results for input(s): LIPASE, AMYLASE in the last 168 hours. No results for input(s): AMMONIA in the last 168 hours.  ABG    Component Value Date/Time   PHART 7.131 (LL) 03/23/2021 1028   PCO2ART 46.3 03/23/2021 1028   PO2ART 95 03/23/2021 1028   HCO3 15.5 (L) 03/23/2021 1028   TCO2 17 (L) 03/23/2021 1028   ACIDBASEDEF 13.0 (H) 03/23/2021 1028   O2SAT 95.0 03/23/2021 1028     Coagulation Profile: Recent Labs  Lab 03/17/21 1530 03/13/2021 0832  INR 2.0* 1.8*    Cardiac Enzymes: No results for input(s): CKTOTAL, CKMB, CKMBINDEX,  TROPONINI in the last 168 hours.  HbA1C: Hgb A1c MFr Bld  Date/Time  Value Ref Range Status  02/01/2012 08:14 PM 5.5 <5.7 % Final    Comment:    (NOTE)                                                                       According to the ADA Clinical Practice Recommendations for 2011, when HbA1c is used as a screening test:  >=6.5%   Diagnostic of Diabetes Mellitus           (if abnormal result is confirmed) 5.7-6.4%   Increased risk of developing Diabetes Mellitus References:Diagnosis and Classification of Diabetes Mellitus,Diabetes [id number] 1):S62-S69 and Standards of Medical Care in         Diabetes - 2011,Diabetes [id number] (Suppl 1):S11-S61.    CBG: Recent Labs  Lab 03/22/21 2318 03/23/21 0445 03/23/21 0738 03/23/21 1124 03/23/21 1127  GLUCAP 98 84 73 29* 26*    Review of Systems:   Gen: Denies fever, chills, weight change, fatigue, night sweats HEENT: Denies blurred vision, double vision, hearing loss, tinnitus, sinus congestion, rhinorrhea, sore throat, neck stiffness, dysphagia PULM: Denies shortness of breath, cough, sputum production, hemoptysis, wheezing CV: Denies chest pain, edema, orthopnea, paroxysmal nocturnal dyspnea, palpitations GI: Denies abdominal pain, nausea, vomiting, diarrhea, hematochezia, melena, constipation, change in bowel habits GU: Denies dysuria, hematuria, polyuria, oliguria, urethral discharge Endocrine: Denies hot or cold intolerance, polyuria, polyphagia or appetite change Derm: Denies rash, dry skin, scaling or peeling skin change Heme: Denies easy bruising, bleeding, bleeding gums Neuro: Denies headache, numbness, weakness, slurred speech, loss of memory or consciousness  Past Medical History:  She,  has a past medical history of Anemia, Asthma, Atrial flutter, paroxysmal (HCC), Back pain, Blood transfusion without reported diagnosis, Breast cyst, CHF (congestive heart failure) ([place]), Degenerative joint disease of spine,  History of shingles, Hypertension, Lymphadenopathy, Menopause, Myeloproliferative neoplasm ([place]), Obesity, Psychosis ([place]), Shingles, Stasis dermatitis, SVT (supraventricular tachycardia) ([place]) (01/2008), and Tuberculosis.   Surgical History:   Past Surgical History:  Procedure Laterality Date   BIOPSY  03/23/2021   Procedure: BIOPSY;  Surgeon: Lavena Bullion, DO;  Location: [facility];  Service: Gastroenterology;;   BREAST CYST ASPIRATION Left    Breast cyst biopsy     CHOLECYSTECTOMY  02/02/2012   Procedure: LAPAROSCOPIC CHOLECYSTECTOMY;  Surgeon: Zenovia Jarred, MD;  Location: [place];  Service: General;  Laterality: N/A;   ESOPHAGOGASTRODUODENOSCOPY (EGD) WITH PROPOFOL N/A 03/19/2021   Procedure: ESOPHAGOGASTRODUODENOSCOPY (EGD) WITH PROPOFOL;  Surgeon: Lavena Bullion, DO;  Location: [facility];  Service: Gastroenterology;  Laterality: N/A;   GIVENS CAPSULE STUDY N/A 03/16/2021   Procedure: GIVENS CAPSULE STUDY;  Surgeon: Lavena Bullion, DO;  Location: [facility];  Service: Gastroenterology;  Laterality: N/A;   Hemilaminectomy of L4, L5 and S1 decompression of tumor in epidural space  2000   IR FLUORO GUIDED NEEDLE PLC ASPIRATION/INJECTION LOC  07/17/2019     Social History:   reports that she has never smoked. She has never used smokeless tobacco. She reports that she does not drink alcohol and does not use drugs.   Family History:  Her family history includes Breast cancer in an other family member; Cancer in her sister; Diabetes in her brother; Heart attack in her father; Heart disease in her father and mother;  Sickle cell anemia in her brother; Stroke in her brother, brother, brother, mother, and son. There is no history of Colon cancer, Esophageal cancer, Pancreatic cancer, Stomach cancer, Liver disease, or Rectal cancer.   Allergies Allergies  Allergen Reactions   Prednisone Other (See Comments)    REACTION: Psychosis, talking out of head, insomnia   Seroquel  [Quetiapine Fumarate] Nausea Only and Other (See Comments)    Caused very dry mouth     Home Medications  Prior to Admission medications   Medication Sig Start Date End Date Taking? Authorizing Provider  ADVAIR DISKUS 500-50 MCG/DOSE AEPB Inhale 1 puff into the lungs 2 (two) times daily. 10/07/20  Yes [name], MD  albuterol (VENTOLIN HFA) 108 (90 Base) MCG/ACT inhaler INHALE 1-2 PUFFS INTO THE LUNGS EVERY 6 HOURS AS NEEDED FOR WHEEZING OR FOR SHORTNESS OF BREATH 09/01/20  Yes [name], MD  allopurinol (ZYLOPRIM) 300 MG tablet Take 0.5 tablets (150 mg total) by mouth 2 (two) times daily. 12/07/20  Yes [name], MD  amLODipine (NORVASC) 10 MG tablet Take 0.5 tablets (5 mg total) by mouth daily. 10/07/20  Yes [name], MD  apixaban (ELIQUIS) 5 MG TABS tablet Take 1 tablet (5 mg total) by mouth 2 (two) times daily. 10/07/20  Yes [name], MD  aspirin EC 81 MG tablet Take 1 tablet (81 mg total) by mouth daily. 11/03/20  Yes [name], MD  Ensure Max Protein (ENSURE MAX PROTEIN) LIQD Take 330 mLs (11 oz total) by mouth 2 (two) times daily. Patient taking differently: Take 11 oz by mouth 2 (two) times daily. She drinks regular Ensure not Ensure Max and she prefers chocolate 08/04/19  Yes Danford, Suann Larry, MD  fluticasone (FLONASE) 50 MCG/ACT nasal spray SHAKE LIQUID AND INSTILL ONE (1) SPRAY IN EACH NOSTRIL ONCE DAILY Patient taking differently: Place 1 spray into both nostrils daily as needed for allergies. 01/30/21  Yes [name], MD  furosemide (LASIX) 40 MG tablet Take 1 tablet (40 mg total) by mouth daily. 01/26/21 04/26/21 Yes Rehman, Areeg N, DO  metoprolol tartrate (LOPRESSOR) 50 MG tablet TAKE 1 TABLET BY MOUTH TWICE DAILY 09/01/20  Yes [name], MD  [facility] powder APPLY TOPICALLY TO AFFECTED AREAS TWICE DAILY Patient taking differently: Apply 1 application topically 2 (two) times daily. 09/01/20  Yes [name], MD  omeprazole (PRILOSEC) 20 MG  capsule TAKE 1 CAPSULE BY MOUTH DAILY 01/30/21  Yes [name], MD  potassium chloride SA (KLOR-CON) 20 MEQ tablet Take 0.5 tablets (10 mEq total) by mouth daily. 01/26/21  Yes Rehman, Areeg N, DO  pravastatin (PRAVACHOL) 20 MG tablet Take 1 tablet (20 mg total) by mouth daily. 10/07/20  Yes [name], MD  sodium chloride (OCEAN) 0.65 % SOLN nasal spray Place 1 spray into both nostrils as needed for congestion. 09/29/19  Yes [name], MD     Fredderick Severance, MS4 03/23/21 12:15 PM

## 2021-03-23 NOTE — Progress Notes (Addendum)
[facility] Progress Note Patient Name: Erika [name] DOB: January 20, 1941 MRN: [id number]   Date of Service  03/23/2021  HPI/Events of Note  Patient is in atrial flutter with a rate now 140's to 150's, she is on 25 mcg of Levophed. K+ 4.4, no recent Mg++. Lactic acid 3.6 after fluid bolus.  eICU Interventions  Will start Phenylephrine and attempt to wean patient off Levophed, will also start Cardizem at a fixed dose of 5 mg / hour, Metoprolol has been discontinued. Will check a Mg++ level.        Kerry Kass Taariq Leitz 03/23/2021, 2:23 AM

## 2021-03-23 NOTE — Procedures (Signed)
Arterial Catheter Insertion Procedure Note  Erika [name]  [id number]  Jul 29, 1941  Date:03/23/21  Time:12:52 PM    Provider Performing: Candee Furbish    Procedure: Insertion of Arterial Line [phone]) with US guidance [id number])   Indication(s) Blood pressure monitoring and/or need for frequent ABGs  Consent Risks of the procedure as well as the alternatives and risks of each were explained to the patient and/or caregiver.  Consent for the procedure was obtained and is signed in the bedside chart  Anesthesia None   Time Out Verified patient identification, verified procedure, site/side was marked, verified correct patient position, special equipment/implants available, medications/allergies/relevant history reviewed, required imaging and test results available.   Sterile Technique Maximal sterile technique including full sterile barrier drape, hand hygiene, sterile gown, sterile gloves, mask, hair covering, sterile ultrasound probe cover (if used).   Procedure Description Area of catheter insertion was cleaned with chlorhexidine and draped in sterile fashion. Without real-time ultrasound guidance an arterial catheter was placed into the left radial artery.  Appropriate arterial tracings confirmed on monitor.     Complications/Tolerance None; patient tolerated the procedure well.   EBL Minimal   Specimen(s) None

## 2021-03-23 NOTE — Plan of Care (Signed)
GOALS OF CARE DISCUSSION   The Clinical status was relayed to patient's at bedside in detail.   Updated and notified of patients medical condition.     Patient remains unresponsive, now with multiple organ failure including acute hypoxic/hypercapnic respiratory failure, acute kidney injury, liver failure and with septic shock. Explained to family course of therapy and the modalities   Recommend follow up   Patient with Progressive multiorgan failure with a very high probablity of a very minimal chance of meaningful recovery despite all aggressive and optimal medical therapy.  PATIENT REMAINS FULL CODE     Family are satisfied with Plan of action and management. All questions answered   Additional CC time 25 mins    Jacky Kindle MD [facility] See Amion for pager If no response to pager, please call [phone] until 7pm After 7pm, Please call E-link [phone]

## 2021-03-23 NOTE — Progress Notes (Signed)
3 Days Post-Op  Subjective: Patient with increasing ventilatory issues requiring intubation.  Query aspiration as well.  On pressor support for her BP.  S/p RSI so patient sedated and unable to provide any information.  RN states she wasn't really complaining of abdominal pain prior to intubation except with some palpation.  ROS: unable, on vent  Objective: Vital signs in last 24 hours: Temp:  [96.2 F (35.7 C)-97.7 F (36.5 C)] 97.7 F (36.5 C) (08/18 0739) Pulse Rate:  [130-155] 144 (08/18 0800) Resp:  [14-30] 30 (08/18 0800) BP: (65-118)/(20-75) 118/55 (08/18 0800) SpO2:  [90 %-100 %] 95 % (08/18 0800) Arterial Line BP: (87-134)/(-20-53) 87/45 (08/18 0800) FiO2 (%):  [50 %-60 %] 50 % (08/18 0729) Last BM Date: 03/22/21  Intake/Output from previous day: 08/17 0701 - 08/18 0700 In: 2300.6 [P.O.:360; I.V.:963.9; IV Piggyback:976.7] Out: 150 [Urine:150] Intake/Output this shift: Total I/O In: 231.1 [I.V.:143.5; IV Piggyback:87.6] Out: -   PE: Gen: sedated on vent Heart: tachy, 150s Lungs: on vent Abd: soft, obese, seems ND, unable to assess tenderness, hernia is soft and can be reduced.  OGT in place with minimal output currently.  Lab Results:  Recent Labs    03/22/21 1550 03/22/21 1618 03/23/21 0440  WBC 264.9*  --  324.5*  HGB 7.6* 10.9* 7.7*  HCT 24.8* 32.0* 24.6*  PLT 114*  --  133*   BMET Recent Labs    03/22/21 0226 03/22/21 1618 03/23/21 0440  NA 137 135 135  K 3.6 4.4 5.0  CL 100  --  97*  CO2 24  --  19*  GLUCOSE 38*  --  94  BUN 77*  --  79*  CREATININE 2.06*  --  2.73*  CALCIUM 9.0  --  9.5   PT/INR No results for input(s): LABPROT, INR in the last 72 hours. CMP     Component Value Date/Time   NA 135 03/23/2021 0440   NA 144 05/23/2020 1351   NA 140 07/24/2017 1402   K 5.0 03/23/2021 0440   K 4.2 07/24/2017 1402   CL 97 (L) 03/23/2021 0440   CO2 19 (L) 03/23/2021 0440   CO2 27 07/24/2017 1402   GLUCOSE 94 03/23/2021 0440    GLUCOSE 85 07/24/2017 1402   BUN 79 (H) 03/23/2021 0440   BUN 23 05/23/2020 1351   BUN 18.0 07/24/2017 1402   CREATININE 2.73 (H) 03/23/2021 0440   CREATININE 2.68 (H) 03/03/2021 0937   CREATININE 1.1 07/24/2017 1402   CALCIUM 9.5 03/23/2021 0440   CALCIUM 9.2 07/24/2017 1402   PROT 5.6 (L) 03/23/2021 0440   PROT 8.3 07/24/2017 1402   ALBUMIN 2.4 (L) 03/23/2021 0440   ALBUMIN 3.4 (L) 07/24/2017 1402   AST 33 03/23/2021 0440   AST 18 03/03/2021 0937   AST 11 07/24/2017 1402   ALT 18 03/23/2021 0440   ALT 12 03/03/2021 0937   ALT <6 07/24/2017 1402   ALKPHOS 310 (H) 03/23/2021 0440   ALKPHOS 202 (H) 07/24/2017 1402   BILITOT 1.3 (H) 03/23/2021 0440   BILITOT 0.7 03/03/2021 0937   BILITOT 0.29 07/24/2017 1402   GFRNONAA 17 (L) 03/23/2021 0440   GFRNONAA 18 (L) 03/03/2021 0937   GFRNONAA 62 06/14/2014 1141   GFRAA 41 (L) 05/23/2020 1351   GFRAA 46 (L) 03/01/2020 0934   GFRAA 72 06/14/2014 1141   Lipase     Component Value Date/Time   LIPASE 16 05/29/2017 1416       Studies/Results: CT  ABDOMEN PELVIS WO CONTRAST  Result Date: 03/22/2021 CLINICAL DATA:  Abdominal distension. EXAM: CT ABDOMEN AND PELVIS WITHOUT CONTRAST TECHNIQUE: Multidetector CT imaging of the abdomen and pelvis was performed following the standard protocol without IV contrast. COMPARISON:  Renal ultrasound, 03/16/2021. Chest radiograph, 03/17/2021. CT abdomen and pelvis, 05/13/2017. FINDINGS: Lower chest: Trace left pleural effusion. Bilateral dependent consolidation, favor atelectasis. Cardiomegaly. Aortic valve and descending thoracic aorta calcifications. Hepatobiliary: Normal noncontrast appearance. Status post cholecystectomy. No biliary dilatation. Pancreas: Atrophic. Spleen: Heterogeneous noncontrast appearance of spleen, with small foci of gas within the parenchyma (see key images. Splenomegaly, with spleen measuring up to 14 cm. Adrenals/Urinary Tract: Normal noncontrast appearance of the adrenal  glands and kidneys. No renal calculi, or hydronephrosis. Bladder is unremarkable. Stomach/Bowel: Nonobstructive bowel. Capsule endoscopy camera with the cecum. Nondilated colon. Appendix is difficult to visualize. Vascular/Lymphatic: Aortic atherosclerosis. No enlarged abdominal or pelvic lymph nodes. Reproductive: Calcified leiomyoma. Other: Moderate volume abdominopelvic ascites, new since renal ultrasound 03/16/2021. Musculoskeletal: Anasarca.  No acute osseous abnormality. IMPRESSION: 1. Interval marked enlargement of spleen, with foci of parenchymal gas. Differential diagnosis includes spontaneous splenic rupture versus superimposed infection. Findings are incompletely assessed on this noncontrast examination, recommend contrasted CT abdomen pelvis for further evaluation. 2. New moderate volume of abdominopelvic ascites. 3. Additional chronic and senescent changes, as above. These results were called by telephone at the time of interpretation on 03/22/2021 at 12:26 pm to patient's RN Elisabeth Cara), to be relayed to provider [facility] , who verbally acknowledged these results. Electronically Signed   By: Michaelle Birks M.D.   On: 03/22/2021 12:32   CT ABDOMEN PELVIS W CONTRAST  Result Date: 03/22/2021 CLINICAL DATA:  Abdominal distension EXAM: CT ABDOMEN AND PELVIS WITH CONTRAST TECHNIQUE: Multidetector CT imaging of the abdomen and pelvis was performed using the standard protocol following bolus administration of intravenous contrast. CONTRAST:  61m OMNIPAQUE IOHEXOL 350 MG/ML SOLN COMPARISON:  CT from earlier in the same day. FINDINGS: Lower chest: Lung bases again demonstrate small bilateral pleural effusions with bilateral lower lobe consolidation slightly increased when compared with the prior exam. No sizable parenchymal nodule is noted. Hepatobiliary: Liver is well visualized with evidence of decreased attenuation consistent with fatty infiltration. Mild nodularity is noted suggestive mild cirrhosis. The  gallbladder has been surgically removed. Perihepatic fluid is again identified. Pancreas: The pancreas is predominately fatty replaced and atrophic. Spleen: Spleen is again enlarged with multiple foci of air within. Patchy enhancement is noted even on delayed images despite contrast administration. The majority of the spleen is not demonstrate significant enhancement. Splenic vein is occluded from the level of the splenic hilum to within 3 cm of the spleno portal confluence. These changes likely causing origin of the spleen and likely infarction with poor perfusion and areas of air within. The air is highly suggestive of underlying infection. Adrenals/Urinary Tract: Adrenal glands are within normal limits. Kidneys demonstrate a normal enhancement pattern. Delayed images demonstrate no significant enhancement likely related to the known renal failure. No obstructive changes are seen. The bladder is decompressed. Stomach/Bowel: Administered givens capsule lies in the proximal right colon. The appendix is not well visualized although no inflammatory changes are seen. Small bowel and stomach appear within normal limits. Vascular/Lymphatic: Aortic atherosclerosis. No enlarged abdominal or pelvic lymph nodes. Reproductive: Uterus shows calcified uterine fibroid similar to that seen on prior exam. No adnexal mass is noted. Other: Ascites is noted throughout the abdomen similar to that seen on the prior exam. This may be related to the  changes in the spleen although evaluation is difficult. Generalized changes of anasarca are noted. Small fat containing anterior abdominal wall hernia is noted just above the umbilicus Musculoskeletal: No acute bony abnormality is noted. IMPRESSION: Persistent enlarged spleen with patchy areas of enhancement following contrast administration. Small foci of air are noted within. There are changes consistent with splenic vein thrombosis extending from the splenic hilum to within 3 cm of the  spleno portal confluence. This is likely the etiology of the enlarged spleen and what appears to be diffuse splenic infarction. Foci of air within suggest underlying infection. Changes of cirrhosis of the liver with mild ascites and changes of anasarca. Bilateral pleural effusions with increasing lower lobe consolidation when compared with the study obtained earlier in the same day. Critical Value/emergent results were called by telephone at the time of interpretation on 03/22/2021 at 6:03 pm to Noe Gens, NP who verbally acknowledged these results. Electronically Signed   By: Inez Catalina M.D.   On: 03/22/2021 18:04   DG CHEST PORT 1 VIEW  Result Date: 03/22/2021 CLINICAL DATA:  Displacement of central venous catheter. EXAM: PORTABLE CHEST 1 VIEW COMPARISON:  March 17, 2021. FINDINGS: Stable cardiomegaly. No pneumothorax is noted. Left internal jugular catheter is noted with distal tip in expected position of right atrium. Mild bibasilar subsegmental atelectasis is noted. Bony thorax is unremarkable. IMPRESSION: Left internal jugular catheter is noted with distal tip in expected position of right atrium. Mild bibasilar subsegmental atelectasis is noted. Electronically Signed   By: Marijo Conception M.D.   On: 03/22/2021 18:21    Anti-infectives: Anti-infectives (From admission, onward)    Start     Dose/Rate Route Frequency Ordered Stop   03/22/21 1830  metroNIDAZOLE (FLAGYL) IVPB 500 mg        500 mg 100 mL/hr over 60 Minutes Intravenous Every 8 hours 03/22/21 1716     03/22/21 1830  ceFEPIme (MAXIPIME) 2 g in sodium chloride 0.9 % 100 mL IVPB        2 g 200 mL/hr over 30 Minutes Intravenous Every 12 hours 03/22/21 1809     03/21/21 1445  cefTRIAXone (ROCEPHIN) 1 g in sodium chloride 0.9 % 100 mL IVPB  Status:  Discontinued        1 g 200 mL/hr over 30 Minutes Intravenous Every 24 hours 03/21/21 1354 03/22/21 1739   03/19/21 1000  cefTRIAXone (ROCEPHIN) 1 g in sodium chloride 0.9 % 100 mL IVPB         1 g 200 mL/hr over 30 Minutes Intravenous Every 24 hours 03/14/2021 1621 03/21/21 0905   03/06/2021 1200  vancomycin (VANCOREADY) IVPB 500 mg/100 mL  Status:  Discontinued        500 mg 100 mL/hr over 60 Minutes Intravenous Every 24 hours 03/17/21 1108 04/04/2021 1621   03/17/21 1200  ceFEPIme (MAXIPIME) 2 g in sodium chloride 0.9 % 100 mL IVPB  Status:  Discontinued        2 g 200 mL/hr over 30 Minutes Intravenous Every 24 hours 03/17/21 1107 03/30/2021 1621   03/17/21 1145  vancomycin (VANCOREADY) IVPB 1250 mg/250 mL        1,250 mg 166.7 mL/hr over 90 Minutes Intravenous  Once 03/17/21 1056 03/17/21 1517   03/16/21 1330  cefTRIAXone (ROCEPHIN) 1 g in sodium chloride 0.9 % 100 mL IVPB  Status:  Discontinued        1 g 200 mL/hr over 30 Minutes Intravenous Every 24 hours 03/16/21 1306 03/17/21 1052  Assessment/Plan Splenomegaly likely secondary to myelodysplastic disorder with spontaneous splenic venin thrombosis -hgb currently stable with no active signs of bleeding -thrombosis likely secondary to myelodysplastic d/o with no evidence of splenic rupture.  WBC 324K today -no needs for splenectomy -anticoagulation and conservative measures recommended -oncology eval last pm as well and felt same -no plans for surgical intervention.  Please call if needed.  FEN - NPO/OGT VTE - heparin gtt ID - cefipime/Flagyl  Acute hypoxic respiratory failure - per primary A flutter CHF Cirrhosis Leukocytosis secondary to above HTN, now hypotensive   LOS: 7 days    Henreitta Cea , [facility] 03/23/2021, 9:03 AM Please see Amion for pager number during day hours 7:00am-4:30pm or 7:00am -11:30am on weekends

## 2021-03-23 NOTE — Procedures (Signed)
Central Venous Catheter Insertion Procedure Note  Erika [name]  [id number]  14-Jan-1941  Date:03/23/21  Time:12:48 PM   Provider Performing:Kaj Vasil Cipriano Mile   Procedure: Insertion of Non-tunneled Central Venous [id number]) with US guidance [id number])   Indication(s) Medication administration and Difficult access  Consent Risks of the procedure as well as the alternatives and risks of each were explained to the patient and/or caregiver.  Consent for the procedure was obtained and is signed in the bedside chart  Anesthesia Topical only with 1% lidocaine   Timeout Verified patient identification, verified procedure, site/side was marked, verified correct patient position, special equipment/implants available, medications/allergies/relevant history reviewed, required imaging and test results available.  Sterile Technique Maximal sterile technique including full sterile barrier drape, hand hygiene, sterile gown, sterile gloves, mask, hair covering, sterile ultrasound probe cover (if used).  Procedure Description Area of catheter insertion was cleaned with chlorhexidine and draped in sterile fashion.  With real-time ultrasound guidance a central venous catheter was placed into the left internal jugular vein. Nonpulsatile blood flow and easy flushing noted in all ports.  The catheter was sutured in place and sterile dressing applied.  Complications/Tolerance None; patient tolerated the procedure well. Chest X-ray is ordered to verify placement for internal jugular or subclavian cannulation.   Chest x-ray is not ordered for femoral cannulation.  EBL Minimal  Specimen(s) None

## 2021-03-23 NOTE — Progress Notes (Signed)
Hypoglycemic Event  CBG: 29  Treatment: D50 50 mL (25 gm)  Symptoms: Sweaty  Follow-up CBG: [id number] CBG Result:73  Possible Reasons for Event: Inadequate meal intake  Comments/MD notified:Dr. Chand notified.  D10W ordered.      Irven [place]

## 2021-03-24 DIAGNOSIS — D649 Anemia, unspecified: Secondary | ICD-10-CM | POA: Diagnosis not present

## 2021-03-24 DIAGNOSIS — I480 Paroxysmal atrial fibrillation: Secondary | ICD-10-CM | POA: Diagnosis not present

## 2021-03-24 DIAGNOSIS — I5033 Acute on chronic diastolic (congestive) heart failure: Secondary | ICD-10-CM | POA: Diagnosis not present

## 2021-03-24 DIAGNOSIS — Z515 Encounter for palliative care: Secondary | ICD-10-CM

## 2021-03-24 DIAGNOSIS — Z7189 Other specified counseling: Secondary | ICD-10-CM

## 2021-03-24 DIAGNOSIS — K922 Gastrointestinal hemorrhage, unspecified: Secondary | ICD-10-CM | POA: Diagnosis not present

## 2021-03-24 DIAGNOSIS — Z66 Do not resuscitate: Secondary | ICD-10-CM

## 2021-03-24 LAB — POCT I-STAT 7, (LYTES, BLD GAS, ICA,H+H)
Acid-base deficit: 5 mmol/L — ABNORMAL HIGH (ref 0.0–2.0)
Bicarbonate: 19.8 mmol/L — ABNORMAL LOW (ref 20.0–28.0)
Calcium, Ion: 1.17 mmol/L (ref 1.15–1.40)
HCT: 39 % (ref 36.0–46.0)
Hemoglobin: 13.3 g/dL (ref 12.0–15.0)
O2 Saturation: 89 %
Patient temperature: 98.6
Potassium: 4.6 mmol/L (ref 3.5–5.1)
Sodium: 137 mmol/L (ref 135–145)
TCO2: 21 mmol/L — ABNORMAL LOW (ref 22–32)
pCO2 arterial: 37 mmHg (ref 32.0–48.0)
pH, Arterial: 7.337 — ABNORMAL LOW (ref 7.350–7.450)
pO2, Arterial: 60 mmHg — ABNORMAL LOW (ref 83.0–108.0)

## 2021-03-24 LAB — COMPREHENSIVE METABOLIC PANEL
ALT: 38 U/L (ref 0–44)
ALT: 630 U/L — ABNORMAL HIGH (ref 0–44)
AST: 81 U/L — ABNORMAL HIGH (ref 15–41)
AST: 1086 U/L — ABNORMAL HIGH (ref 15–41)
Albumin: 2 g/dL — ABNORMAL LOW (ref 3.5–5.0)
Albumin: 2 g/dL — ABNORMAL LOW (ref 3.5–5.0)
Alkaline Phosphatase: 302 U/L — ABNORMAL HIGH (ref 38–126)
Alkaline Phosphatase: 313 U/L — ABNORMAL HIGH (ref 38–126)
Anion gap: 21 — ABNORMAL HIGH (ref 5–15)
Anion gap: 15 (ref 5–15)
BUN: 81 mg/dL — ABNORMAL HIGH (ref 8–23)
BUN: 82 mg/dL — ABNORMAL HIGH (ref 8–23)
CO2: 16 mmol/L — ABNORMAL LOW (ref 22–32)
CO2: 18 mmol/L — ABNORMAL LOW (ref 22–32)
Calcium: 8.9 mg/dL (ref 8.9–10.3)
Calcium: 8.4 mg/dL — ABNORMAL LOW (ref 8.9–10.3)
Chloride: 103 mmol/L (ref 98–111)
Chloride: 101 mmol/L (ref 98–111)
Creatinine, Ser: 2.9 mg/dL — ABNORMAL HIGH (ref 0.44–1.00)
Creatinine, Ser: 3.26 mg/dL — ABNORMAL HIGH (ref 0.44–1.00)
GFR, Estimated: 16 mL/min — ABNORMAL LOW (ref >60)
GFR, Estimated: 14 mL/min — ABNORMAL LOW (ref >60)
Glucose, Bld: 21 mg/dL — CL (ref 70–99)
Glucose, Bld: 85 mg/dL (ref 70–99)
Potassium: 4.6 mmol/L (ref 3.5–5.1)
Potassium: 4.3 mmol/L (ref 3.5–5.1)
Sodium: 140 mmol/L (ref 135–145)
Sodium: 134 mmol/L — ABNORMAL LOW (ref 135–145)
Total Bilirubin: 1.9 mg/dL — ABNORMAL HIGH (ref 0.3–1.2)
Total Bilirubin: 2.2 mg/dL — ABNORMAL HIGH (ref 0.3–1.2)
Total Protein: 4.7 g/dL — ABNORMAL LOW (ref 6.5–8.1)
Total Protein: 4.9 g/dL — ABNORMAL LOW (ref 6.5–8.1)

## 2021-03-24 LAB — BPAM RBC
Blood Product Expiration Date: 202209172359
Blood Product Expiration Date: 202209202359
ISSUE DATE / TIME: 202208181359
ISSUE DATE / TIME: 202208181800
Unit Type and Rh: 5100
Unit Type and Rh: 5100

## 2021-03-24 LAB — GLUCOSE, CAPILLARY
Glucose-Capillary: 81 mg/dL (ref 70–99)
Glucose-Capillary: 59 mg/dL — ABNORMAL LOW (ref 70–99)
Glucose-Capillary: 78 mg/dL (ref 70–99)
Glucose-Capillary: 81 mg/dL (ref 70–99)

## 2021-03-24 LAB — HEPARIN LEVEL (UNFRACTIONATED): Heparin Unfractionated: >1.1 IU/mL — ABNORMAL HIGH (ref 0.30–0.70)

## 2021-03-24 LAB — HEMOGLOBIN AND HEMATOCRIT, BLOOD
HCT: 28.6 % — ABNORMAL LOW (ref 36.0–46.0)
Hemoglobin: 9.4 g/dL — ABNORMAL LOW (ref 12.0–15.0)

## 2021-03-24 LAB — TYPE AND SCREEN
ABO/RH(D): O POS
Antibody Screen: NEGATIVE
Unit division: 0
Unit division: 0

## 2021-03-24 LAB — CBC
HCT: 28 % — ABNORMAL LOW (ref 36.0–46.0)
Hemoglobin: 9.4 g/dL — ABNORMAL LOW (ref 12.0–15.0)
MCH: 31.6 pg (ref 26.0–34.0)
MCHC: 33.6 g/dL (ref 30.0–36.0)
MCV: 94.3 fL (ref 80.0–100.0)
Platelets: 123 10*3/uL — ABNORMAL LOW (ref 150–400)
RBC: 2.97 MIL/uL — ABNORMAL LOW (ref 3.87–5.11)
RDW: 19.3 % — ABNORMAL HIGH (ref 11.5–15.5)
WBC: 316.6 10*3/uL (ref 4.0–10.5)
nRBC: 0.1 % (ref 0.0–0.2)

## 2021-03-24 LAB — APTT
aPTT: 68 seconds — ABNORMAL HIGH (ref 24–36)
aPTT: 68 seconds — ABNORMAL HIGH (ref 24–36)

## 2021-03-24 LAB — MAGNESIUM: Magnesium: 1.6 mg/dL — ABNORMAL LOW (ref 1.7–2.4)

## 2021-03-24 LAB — PATHOLOGIST SMEAR REVIEW

## 2021-03-24 LAB — PROCALCITONIN: Procalcitonin: 19.29 ng/mL

## 2021-03-24 LAB — LACTIC ACID, PLASMA: Lactic Acid, Venous: 5.6 mmol/L (ref 0.5–1.9)

## 2021-03-24 MED ORDER — MAGNESIUM SULFATE 4 GM/100ML IV SOLN
4.0000 g | Freq: Once | INTRAVENOUS | Status: DC
  Filled 2021-03-24: qty 100

## 2021-03-24 MED ORDER — FUROSEMIDE 10 MG/ML IJ SOLN
100.0000 mg | Freq: Once | INTRAVENOUS | Status: AC
  Administered 2021-03-24: 100 mg via INTRAVENOUS
  Filled 2021-03-24: qty 10

## 2021-03-24 MED ORDER — CALCIUM CHLORIDE 10 % IV SOLN: 1.0000 g | Freq: Once | INTRAVENOUS | Status: DC

## 2021-03-24 MED ORDER — DEXTROSE 20 % IV SOLN
INTRAVENOUS | Status: DC
  Filled 2021-03-24 (×4): qty 500

## 2021-03-24 MED ORDER — HYDROMORPHONE BOLUS VIA INFUSION
0.5000 mg | INTRAVENOUS | Status: DC | PRN
  Filled 2021-03-24: qty 1

## 2021-03-24 MED ORDER — CALCIUM GLUCONATE-NACL 1-0.675 GM/50ML-% IV SOLN
1.0000 g | Freq: Once | INTRAVENOUS | Status: AC
  Administered 2021-03-24: 1000 mg via INTRAVENOUS
  Filled 2021-03-24: qty 50

## 2021-03-24 MED ORDER — SODIUM CHLORIDE 0.9 % IV SOLN
1.0000 mg/h | INTRAVENOUS | Status: DC
  Administered 2021-03-24: 0.5 mg/h via INTRAVENOUS
  Filled 2021-03-24: qty 2.5

## 2021-03-24 MED ORDER — SODIUM BICARBONATE 8.4 % IV SOLN
INTRAVENOUS | Status: AC
  Administered 2021-03-24: 100 meq
  Filled 2021-03-24: qty 100

## 2021-03-27 LAB — CULTURE, BLOOD (ROUTINE X 2)
Culture: NO GROWTH
Culture: NO GROWTH

## 2021-03-29 ENCOUNTER — Telehealth: Payer: PPO

## 2021-04-03 LAB — GLUCOSE, CAPILLARY: Glucose-Capillary: 29 mg/dL — CL (ref 70–99)

## 2021-04-06 NOTE — Progress Notes (Signed)
Son Erika [name]) states that family will call with name of funeral home.  [facility] information is:  Erika [name] (son) [address] [place], [place] [address] [phone]

## 2021-04-06 NOTE — Progress Notes (Signed)
OT Cancellation Note  Patient Details Name: Erika [name] MRN: [id number] DOB: [birth year]   Cancelled Treatment:    Reason Eval/Treat Not Completed: Medical issues which prohibited therapy;Patient not medically ready. Pt transferred to the ICU 8/17 for worsening AMS, hypotension and respiratory distress s/p intubation. 8/18 patient with increasing pressor requirements secondary to severe septic shock. Patient also with worsening organ dysfunction. OT to hold at this time.   [name] OTR/L Supplemental OT, Department of rehab services [phone]  Curlie Macken R H. Apr 07, 2021, 7:34 AM

## 2021-04-06 NOTE — Consult Note (Signed)
Consultation Note Date: 03-31-21   Patient Name: Erika [name]  DOB: 10-May-1941  MRN: [id number]  Age / Sex: 80 y.o., female  PCP: Erika Falcon, MD Referring Physician: Jacky Kindle, MD  Reason for Consultation: Establishing goals of care  HPI/Patient Profile: 80 y.o. female  with past medical history of myeloproliferative disorder, HTN, aortic stenosis, atrial flutter on Eliquis, CHF with preserved ejection fraction, CKD stage III and anemia admitted on 03/29/2021 with suspicion of GI bleeding with c/c of weakness and dizziness with melena for 3 days PTA.   Currently, patient is critically ill with shock, splenic vein thrombosis with gas-forming infection within the spleen. She is requiring vasopressor support, aggressive fluid resuscitation, anticoagulation for thrombosis, and IV antibiotics for MRSA screen positive. She had one episode of hypoglycemia (CBG 34) on 8/18 but her CBGs continue to trends in the low 80s.  Dr. Tacy [name] had a discussion with family yesterday afternoon 8/18 and patient code status was changed to DNR.   PMT was consulted for [place] discussion.   Clinical Assessment and Goals of Care: I met today with patient's son Erika [name] and Erika [name] at bedside. We discussed clinical course as well as wishes moving forward in regard to advanced directives. I introduced palliative care as specialized medical care for people living with serious illness. It focuses on providing relief from the symptoms and stress of a serious illness. The goal is to improve quality of life for both the patient and the family.  Concepts specific to EOL care were discussed.  We discussed difference between a aggressive medical intervention path and a palliative, comfort focused care path.  Values and goals of care important to patient and family were attempted to be elicited. The son shared that he does not want  his mom to suffer and he is ready to let her go. I reviewed setting limitations on her care by not escalating care with additional labwork, antibiotics, or aggressive medical interventions. The son was in agreement to allow for a natural death. I shared that this would be liberating her from the ventilator and providing medications to keep her comfortable.  He would like for more family to visit with Erika [name] and to plan for extubation about 5pm today.   I advised him that we will place the unrestricted visitor policy and allow for more visitors. I will put in place measures that focus on the patient's comfort and dignity. When he is ready he can notify the care team to start the extubation process.   After our discussion I received a call from the bedside nurse.  She said the patient's other son and other granddaughter Erika [name] had arrived and had more questions regarding the patient's care.  I returned to bedside and spoke with the son.  He asked appropriate questions regarding aggressive care versus comfort measures.  He asked why we would not just continue to keep her alive. I educated the patient that we are doing things to her and not for her. If we continue the heavy  respiratory and blood pressure support then we would just be prolonging life. Once all of his questions were answered he said bye and returned to bedside.   Bedside RN updated of additional family discussion with son and granddaughter. Comfort measures with extubation planned for 5pm is still the plan.  Questions and concerns addressed. PMT will continue to support holistically.   Primary Decision Maker NEXT OF KIN   Code Status/Advance Care Planning: DNR Dilaudid infusion 0.44m/hr Dilaudid bolus from infusion [id number] Unrestricted visitors Care focused on comfort - no escalation of care  Prognosis:   Hours - Days  Discharge Planning: Anticipated Hospital Death  Primary Diagnoses: Present on Admission:  GI bleed   Acute on chronic heart failure with preserved ejection fraction (HFpEF) (HCC)  AF (paroxysmal atrial fibrillation) (HCCHayesvilleReview of Systems  Unable to perform ROS  Physical Exam Constitutional:      General: She is not in acute distress. HENT:     Head: Normocephalic and atraumatic.  Cardiovascular:     Rate and Rhythm: Tachycardia present.  Pulmonary:     Comments: Full ventilator support Abdominal:     Palpations: Abdomen is soft.  Skin:    General: Skin is warm and dry.    Vital Signs: BP (!) 100/55   Pulse (!) 119   Temp 99 F (37.2 C) (Oral)   Resp (!) 24   Ht 5' 7"  (1.702 m)   Wt 125.3 kg   SpO2 94%   BMI 43.26 kg/m  Pain Scale: CPOT Pain Score: 3  SpO2: SpO2: 94 % O2 Device:SpO2: 94 % O2 Flow Rate: .O2 Flow Rate (L/min): 12 L/min    Palliative Assessment/Data: 10%     I discussed this patient's plan of care with Dr. ChaTacy Learnedside RN Erika [name]'s sons Erika [name] Erika [name] patient's grandchildren Erika Billowd KilBentonThank you for this consult. Palliative medicine will continue to follow and assist as needed.   Time Total: 85 minutes Greater than 50%  of this time was spent counseling and coordinating care related to the above assessment and plan.  Signed by: Erika HawksNP, FNP-BC Palliative Medicine  ShaJuel BurrowNP, AGNMunson Healthcare Graylinglliative Medicine Team Team Phone # [phone] # [phone] Please contact Palliative Medicine Team phone at [phone] questions and concerns.  For individual provider: See Erika [name]

## 2021-04-06 NOTE — Progress Notes (Signed)
Hypoglycemic Event  CBG: 59  Treatment: D50 25 mL (12.5 gm)  Symptoms: None  Follow-up CBG: [id number]  CBG Result:79  Possible Reasons for Event: Unknown  Comments/MD notified: Dr. Tacy Learn notified, will start Dextrose 20%    Erika [name]

## 2021-04-06 NOTE — Progress Notes (Signed)
[place] for heparin Indication: Splenic vein thrombosis 03/22/21  Patient Measurements: Height: '5\' 7"'$  (170.2 cm) Weight: 125.3 kg (276 lb 3.8 oz) IBW/kg (Calculated) : 61.6 Heparin Dosing Weight: 96kg  Vital Signs: Temp: 99 F (37.2 C) (08/19 0400) Temp Source: Oral (08/19 0400) BP: 100/55 (08/19 0801) Pulse Rate: 119 (08/19 0801)  Labs: Recent Labs    03/23/21 0440 03/23/21 1028 03/23/21 1052 03/23/21 1356 03/23/21 2025 03/23/21 2340 03/23/21 2343 04-14-21 0408 14-Apr-2021 0450 04-14-2021 0948  HGB 7.7*   < > 6.5*  --    < > 9.4*  --  13.3  --  9.4*  HCT 24.6*   < > 21.9*  --    < > 28.6*  --  39.0  --  28.0*  PLT 133*  --  121*  --   --   --   --   --   --  123*  APTT 116*  --  123* 148*  --   --  68*  --  68*  --   HEPARINUNFRC  --   --   --  >1.10*  --   --   --   --   --  >1.10*  CREATININE 2.73*  --  2.90*  --   --   --   --   --   --  3.26*  TROPONINIHS  --   --  71* 210*  --   --   --   --   --   --    < > = values in this interval not displayed.     Estimated Creatinine Clearance: 19.6 mL/min (A) (by C-G formula based on SCr of 3.26 mg/dL (H)).   Assessment: 80 yo W with acute splenic vein thrombosis 03/22/21. On apixaban for afib/flutter prior to admission, which was held 8/10-8/15 for anemia and hematochezia, restarted on 8/16, last dose 8/17 at 9 AM. Pt underwent EGD 8/13. Pharmacy consulted for heparin dosing.   aPTT remains therapeutic this morning (aPTT 68, goal of 66-102). Heparin level falsely elevated with recent Apixaban use. Hgb 9.4 s/p 2 units PRBC yesterday. No active bleeding noted at this time.   Goal of Therapy:  Heparin level 0.3-0.7 units/ml, PTT 66-102 sec Monitor platelets by anticoagulation protocol: Yes   Plan:  - Continue Heparin at 850 units/hr (8.5 ml/hr) - Daily aPTT/HL until correlated - Will continue to monitor for any signs/symptoms of bleeding and will follow up with aPTT level in the a.m.    Thank you for allowing pharmacy to be a part of this patient's care.  Alycia Rossetti, PharmD, BCPS Clinical Pharmacist Clinical phone for 04-14-2021: [phone] 11:33 AM   **Pharmacist phone directory can now be found on [email or web address] ([email or web address]).  Listed under [place].

## 2021-04-06 NOTE — Progress Notes (Signed)
ANTICOAGULATION CONSULT NOTE  Pharmacy Consult for heparin Indication: Splenic vein thrombosis 03/22/21  Allergies  Allergen Reactions   Prednisone Other (See Comments)    REACTION: Psychosis, talking out of head, insomnia   Seroquel [Quetiapine Fumarate] Nausea Only and Other (See Comments)    Caused very dry mouth    Patient Measurements: Height: '5\' 7"'$  (170.2 cm) Weight: 125.3 kg (276 lb 3.8 oz) IBW/kg (Calculated) : 61.6 Heparin Dosing Weight: 96kg  Vital Signs: Temp: 98.6 F (37 C) (08/18 2348) Temp Source: Axillary (08/18 2348) BP: 120/61 (08/18 2015) Pulse Rate: 133 (08/18 2145)  Labs: Recent Labs    03/22/21 0226 03/22/21 1550 03/22/21 1618 03/23/21 0440 03/23/21 1028 03/23/21 1052 03/23/21 1356 03/23/21 2025 03/23/21 2343  HGB 8.2* 7.6*   < > 7.7* 10.5* 6.5*  --  12.2  --   HCT 25.7* 24.8*   < > 24.6* 31.0* 21.9*  --  36.0  --   PLT 109* 114*  --  133*  --  121*  --   --   --   APTT  --   --    < > 116*  --  123* 148*  --  68*  HEPARINUNFRC  --   --   --   --   --   --  >1.10*  --   --   CREATININE 2.06*  --   --  2.73*  --  2.90*  --   --   --   TROPONINIHS  --   --   --   --   --  71* 210*  --   --    < > = values in this interval not displayed.     Estimated Creatinine Clearance: 22 mL/min (A) (by C-G formula based on SCr of 2.9 mg/dL (H)).   Medical History: Past Medical History:  Diagnosis Date   Anemia    with menses   Asthma    Atrial flutter, paroxysmal ([place])    Back pain    status post surgery 2002   Blood transfusion without reported diagnosis    Breast cyst    Excesion with FNA, begnin in 2004.    CHF (congestive heart failure) (HCC)    Degenerative joint disease of spine    Imaging 2005,  Degenerative hypertrophic facet arthritis changes L4-5 and L5-S1.[name]    History of shingles    Recurrent with post herpetic neuralgia.    Hypertension    Lymphadenopathy    Of the mediastinum, Right side CXR 2008, not read on 2010 cxr.     Menopause    Myeloproliferative neoplasm ([place])    Obesity    BMI 54   Psychosis ([place])    Secondary to prednisone.   Shingles    Stasis dermatitis    W/ LE edema, prviously on lasix now on mazxide.    SVT (supraventricular tachycardia) ([place]) 01/2008   one run while hospitalized   Tuberculosis    active TB treated in 2002, hx of paraspinal lumbar TB,      Assessment: 80 yo W with acute splenic vein thrombosis 03/22/21. On apixaban for afib/flutter prior to admission, which was held 8/10-8/15 for anemia and hematochezia, restarted on 8/16, last dose 8/17 at 9 AM. Pt underwent EGD 8/13. Pharmacy consulted for heparin dosing.   Pt transferred to the ICU 8/17 PM for worsening AMS, hypotension and respiratory distress. 8/18 patient has had increasing pressor requirements secondary to septic shock. Notably patient has worsening organ dysfunction  with a history of cirrhosis, which may be falsely elevating her aPTT levels and contributing to low H/H. Hgb decreased from 10.4>7.7>6.5 with no signs of bleeding noted. Hemodilution may also be contributing to Hgb decrease based on volume of pressors being administered. Discussed the case with Dr. Tacy Learn, and he is ok with continuing heparin as the Hgb decrease is most likely d/t septic shock and not active GIB. PLT have remained stable.   aPTT 68 sec (low end of therapeutic) on gtt at 850 units/hr. Hgb up to 12.2 (s/p PRBC x2 8/18). No overt bleeding noted.  Goal of Therapy:  Heparin level 0.3-0.7 units/ml, PTT 66-102 sec Monitor platelets by anticoagulation protocol: Yes   Plan:  Continue heparin at 850 units/h  F/u a.m. PTT to confirm therapeutic  Sherlon Handing, PharmD, BCPS Please see amion for complete clinical pharmacist phone list 03-26-21 12:41 AM

## 2021-04-06 NOTE — Progress Notes (Signed)
[facility] Progress Note Patient Name: Erika [name] DOB: 12-Dec-1940 MRN: [id number]   Date of Service  03/26/21  HPI/Events of Note  ABG on 80%/PRVC 24/TV 490/P 5 = 7.337/37.0/60/19.8.  eICU Interventions  Plan: Increase PEEP to 8.     Intervention Category Major Interventions: Respiratory failure - evaluation and management  Alexis Reber [place] 26-Mar-2021, 4:45 AM

## 2021-04-06 NOTE — Progress Notes (Signed)
NAME:  Erika [name], MRN:  [id number], DOB:  [birth year], LOS: 8 ADMISSION DATE:  03/08/2021, CONSULTATION DATE:  03/22/2021 REFERRING MD:  Dr. Daryll Drown, CHIEF COMPLAINT:  AMS, Hypotension, Leukocytosis   History of Present Illness:  80 y/o F who presented to [facility] ER on 8/10 with reports of weakness, LE swelling and bloody stool.     She recently had a polypectomy January 27, 2021 and has been on chronic anticoagulation.  After that she noted blood in her stools.  She reported bleeding for one month prior to admit. She reported difficulty walking on admit, decline in her ability to perform self care, shortness of breath.   Initial work up was concerning for possible GIB with hematochezia.  FOBT positive. On admit, she was noted to have had a 20 lb increase over the last 3 months and she was diuresed.  She had AKI and was diuresed.  UA was concerning for possible UTI.  Renal US was performed & consistent with medical renal disease.  Hgb on presentation was 6.3 requiring transfusion. She was evaluated by GI, anticoagulation was held, PPI IV and serial H/H.  She was pan cultured with blood cultures showing staph epi and GNR.  Urine culture grew out pan-sensitive E-Coli.  She was treated accordingly. She underwent EGD on 8/13 with findings of a normal esophagus, non-ulcerated / non-bleeding gastritis with mild oozing from biopsy sites.  Capsule endoscopy was started on 8/15 with no acute bleeding.  She developed a palpable bulge in her abdomen on 8/17, hypotension and AMS. CT of the abdomen without contrast showed interval marked enlargement of the spleen with foci of parenchymal gas, moderate volume of abdominopelvic ascites.  PCCM called 8/17 for evaluation for hypotension.  She was given IVF for hypotension, glucose for low glucose reading on BMP's. Levophed was added for persistent hypotension.  She was evaluated by General Surgery and felt not to have acute surgical needs.  Follow up imaging of the abdomen with  contrast showed persistent enlarged spleen with patchy areas of enhancement, small foci of air and changes consistent with splenic vein thrombosis extending from the splenic hilum to within 3 cm of the spleno portal confluence.  This was reviewed again with surgery and no acute current surgical needs.   Significant Hospital Events: Including procedures, antibiotic start and stop dates in addition to other pertinent events   8/10 admit 8/17 CT w/ contrast splenomegaly with splenic vein thrombosis 8/17 PCCM consulted for hypotension, AMS, anemia 8/18 Patient intubated with increasing pressor requirement 8/19 patient remains critically ill with multiple vasopressors requirement  Interim History / Subjective:  Not much improvement in last 24 hours Patient with progressive multi organ failure  Objective   Blood pressure (!) 100/55, pulse (!) 119, temperature 99 F (37.2 C), temperature source Oral, resp. rate (!) 24, height '5\' 7"'$  (1.702 m), weight 125.3 kg, SpO2 94 %. CVP:  [16 mmHg-17 mmHg] 16 mmHg  Vent Mode: PRVC FiO2 (%):  [60 %-80 %] 80 % Set Rate:  [24 bmp-26 bmp] 26 bmp Vt Set:  [490 mL] 490 mL PEEP:  [5 cmH20-10 cmH20] 10 cmH20 Plateau Pressure:  [27 cmH20-32 cmH20] 29 cmH20   Intake/Output Summary (Last 24 hours) at 16-Apr-2021 1134 Last data filed at 16-Apr-2021 0529 Gross per 24 hour  Intake 4724.39 ml  Output 65 ml  Net 4659.39 ml   Filed Weights   03/10/2021 0400 03/21/21 0622 03/22/21 0419  Weight: 132.6 kg 128.4 kg 125.3 kg  Examination:   Physical exam: General: Crtitically ill-appearing morbidly obese African-American female, orally intubated HEENT: [place]/AT, eyes anicteric.  ETT and OGT in place Neuro: Eyes closed, does not open, pupils 2 mm, reactive to light.   Chest: Bilateral crackles at the bases, no wheezes or rhonchi Heart: Irregularly irregular, tachycardic, no murmurs or gallops Abdomen: Soft, nontender, nondistended, bowel sounds present Skin: No  rash   Resolved Hospital Problem list     Assessment & Plan:  Septic shock, multifactorial due to infected small/pansensitive E. coli UTI/aspiration pneumonia Patient remained in severe shock state Continue to require multiple vasopressors at very high doses including Levophed, vasopressin and phenylephrine Blood lactate trended down from more than 11-5.6 Continue broad-spectrum antibiotics with cefepime and Flagyl Blood cultures and MRSA screen was negative  Acute hypoxic/hypercapnic respiratory failure Mixed respiratory/metabolic acidosis Lactic acidosis Vent setting was adjusted Patient remained on 80% FiO2 and PEEP of 10 Because of hemodynamic instability unable to prone as patient does meet criteria for severe ARDS She had visible aspiration noted during intubation yesterday VAP bundle Elevate head end of the bed Blood lactate is trending down Received multiple doses of IV bicarbonate Monitor ABGs and trend lactate  Acute kidney injury on CKD stage IIIb due to ATN from septic shock Shock liver Patient became anuric despite Lasix challenge She looks volume overloaded Currently on multiple vasopressors, very high risk to start CRRT LFTs started trending up  Acute Metabolic Encephalopathy Likely related to infection/septic shock, metabolic acidosis and hypercapnia Minimize sedation Currently on Precedex infusion  Splenic vein thrombosis w/ splenomegaly c/f infection vs infarction Suspect etiology related to hypercoagulable state with myeloproliferative disorder. CT illustrating splenic vein thrombosis with splenomegaly and paranchymal gas within spleen c/f infarction vs infection Currently on IV heparin infusion and broad-spectrum antibiotics General surgery recommend conservative management  Profound Leukocytosis / Myeloproliferative Disorder  Anemia chronic disease Oncology consulted > no acute treatment recommended at this time.  Suspect reactive process to acute  illness.  - follow CBC assess uric acid     Hyperphosphatemia/hypomagnesemia Continue monitor and supplement electrolytes  Recurrent hypoglycemia in the setting of shock liver Currently on D20 infusion Monitor fingerstick with goal 140-180  Best Practice (right click and "Reselect all SmartList Selections" daily)   Diet/type: NPO DVT prophylaxis: systemic heparin GI prophylaxis: PPI Lines: Central line and Arterial Line Foley:  Yes, and it is still needed Code Status: DNR Last date of multidisciplinary goals of care discussion [8/19; patient's both sons were informed at bedside and updated about current condition.  Prognosis is grave with multiple progressive organ failure and high risk of death.  Patient remains DNR with full aggressive medical management  Labs   CBC: Recent Labs  Lab 03/22/21 0226 03/22/21 1550 03/22/21 1618 03/23/21 0440 03/23/21 1028 03/23/21 1052 03/23/21 2025 03/23/21 2340 04/19/21 0408 19-Apr-2021 0948  WBC 204.4* 264.9*  --  324.5*  --  307.1*  --   --   --  316.6*  NEUTROABS  --  259.6*  --  286.9*  --  294.8*  --   --   --   --   HGB 8.2* 7.6*   < > 7.7*   < > 6.5* 12.2 9.4* 13.3 9.4*  HCT 25.7* 24.8*   < > 24.6*   < > 21.9* 36.0 28.6* 39.0 28.0*  MCV 96.6 98.4  --  98.4  --  102.8*  --   --   --  94.3  PLT 109* 114*  --  133*  --  121*  --   --   --  123*   < > = values in this interval not displayed.    Basic Metabolic Panel: Recent Labs  Lab 03/21/21 0429 03/22/21 0226 03/22/21 1618 03/23/21 0440 03/23/21 1028 03/23/21 1052 03/23/21 2025 2021/04/02 0408 02-Apr-2021 0948  NA 137 137   < > 135 136 140 135 137 134*  K 3.7 3.6   < > 5.0 4.9 4.6 4.6 4.6 4.3  CL 101 100  --  97*  --  103  --   --  101  CO2 23 24  --  19*  --  16*  --   --  18*  GLUCOSE 47* 38*  --  94  --  21*  --   --  85  BUN 76* 77*  --  79*  --  81*  --   --  82*  CREATININE 1.99* 2.06*  --  2.73*  --  2.90*  --   --  3.26*  CALCIUM 9.1 9.0  --  9.5  --  8.9  --   --   8.4*  MG  --   --   --  1.9  --  1.9  --   --  1.6*  PHOS  --   --   --   --   --  7.5*  --   --   --    < > = values in this interval not displayed.   GFR: Estimated Creatinine Clearance: 19.6 mL/min (A) (by C-G formula based on SCr of 3.26 mg/dL (H)). Recent Labs  Lab 03/22/21 1550 03/22/21 1819 03/22/21 1950 03/22/21 2228 03/23/21 0440 03/23/21 1052 03/23/21 1356 04/02/2021 0450 02-Apr-2021 0948 2021-04-02 0955  PROCALCITON  --  8.97  --   --  14.45  --   --  19.29  --   --   WBC 264.9*  --   --   --  324.5* 307.1*  --   --  316.6*  --   LATICACIDVEN  --  3.3*   < > 3.6*  --  >11.0* 11.0*  --   --  5.6*   < > = values in this interval not displayed.    Liver Function Tests: Recent Labs  Lab 03/19/21 0613 03/23/21 0440 03/23/21 1052 04-02-21 0948  AST 26 33 81* 1,086*  ALT 17 18 38 630*  ALKPHOS 309* 310* 302* 313*  BILITOT 1.4* 1.3* 1.9* 2.2*  PROT 6.1* 5.6* 4.7* 4.9*  ALBUMIN 3.0* 2.4* 2.0* 2.0*   No results for input(s): LIPASE, AMYLASE in the last 168 hours. No results for input(s): AMMONIA in the last 168 hours.  ABG    Component Value Date/Time   PHART 7.337 (L) 04-02-21 0408   PCO2ART 37.0 04-02-21 0408   PO2ART 60 (L) 04-02-2021 0408   HCO3 19.8 (L) 04/02/21 0408   TCO2 21 (L) April 02, 2021 0408   ACIDBASEDEF 5.0 (H) 2021/04/02 0408   O2SAT 89.0 04/02/21 0408     Coagulation Profile: Recent Labs  Lab 03/17/21 1530 03/14/2021 0832  INR 2.0* 1.8*    Cardiac Enzymes: No results for input(s): CKTOTAL, CKMB, CKMBINDEX, TROPONINI in the last 168 hours.  HbA1C: Hgb A1c MFr Bld  Date/Time Value Ref Range Status  02/01/2012 08:14 PM 5.5 <5.7 % Final    Comment:    (NOTE)  According to the ADA Clinical Practice Recommendations for 2011, when HbA1c is used as a screening test:  >=6.5%   Diagnostic of Diabetes Mellitus           (if abnormal result is confirmed) 5.7-6.4%    Increased risk of developing Diabetes Mellitus References:Diagnosis and Classification of Diabetes Mellitus,Diabetes [id number] 1):S62-S69 and Standards of Medical Care in         Diabetes - 2011,Diabetes [id number] (Suppl 1):S11-S61.    CBG: Recent Labs  Lab 03/23/21 2020 03/23/21 2347 2021-04-08 0351 2021-04-08 0740 2021-04-08 0817  GLUCAP 143* 89 81 59* 78    Total critical care time: 55 minutes  Performed by: Jacky Kindle   Critical care time was exclusive of separately billable procedures and treating other patients.   Critical care was necessary to treat or prevent imminent or life-threatening deterioration.   Critical care was time spent personally by me on the following activities: development of treatment plan with patient and/or surrogate as well as nursing, discussions with consultants, evaluation of patient's response to treatment, examination of patient, obtaining history from patient or surrogate, ordering and performing treatments and interventions, ordering and review of laboratory studies, ordering and review of radiographic studies, pulse oximetry and re-evaluation of patient's condition.   Jacky Kindle MD [facility] See Amion for pager If no response to pager, please call [phone] until 7pm After 7pm, Please call E-link [phone]

## 2021-04-06 NOTE — Progress Notes (Signed)
Pt extubated to comfort care at this time.

## 2021-04-06 NOTE — Progress Notes (Signed)
PT Cancellation Note  Patient Details Name: TONNIA MCELROY MRN: [id number] DOB: 06-Apr-1941   Cancelled Treatment:    Reason Eval/Treat Not Completed: Medical issues which prohibited therapy (Pt transferred to the ICU 8/17 for worsening AMS, hypotension and respiratory distress s/p intubation. 8/18 patient with increasing pressor requirements secondary to severe septic shock. Patient also with worsening organ dysfunction.)   Alvira Philips April 16, 2021, 8:43 AM Ramses Klecka M,PT Acute Rehab Services [phone] (pager)

## 2021-04-06 NOTE — Progress Notes (Signed)
Nutrition Brief Note  Chart reviewed. Pt now transitioning to comfort care.  No further nutrition interventions planned at this time.  Please re-consult as needed.   Tresha Muzio W, RD, LDN, CDCES Registered Dietitian II Certified Diabetes Care and Education Specialist Please refer to AMION for RD and/or RD on-call/weekend/after hours pager   

## 2021-04-06 NOTE — Progress Notes (Signed)
This RN wasted 45cc of IV dilaudid in stericycle with Devonne Doughty, RN.

## 2021-04-06 NOTE — Death Summary Note (Signed)
DEATH SUMMARY   Patient Details  Name: Erika [name] MRN: [id number] DOB: October 14, 1940  Admission/Discharge Information   Admit Date:  March 31, 2021  Date of Death: Date of Death: 04-09-2021  Time of Death: Time of Death: 0556  Length of Stay: 8  Referring Physician: No primary care provider on file.   Reason(s) for Hospitalization  Septic shock, multifactorial due to infected small/pansensitive E. coli UTI/aspiration pneumonia Acute hypoxic/hypercapnic respiratory failure Mixed respiratory/metabolic acidosis Lactic acidosis Acute kidney injury on CKD stage IIIb due to ATN from septic shock Shock liver Acute Metabolic Encephalopathy Splenic vein thrombosis w/ splenomegaly c/f infection vs infarction Profound Leukocytosis / Myeloproliferative Disorder  Anemia chronic disease Recurrent hypoglycemia in the setting of shock liver A. fib  Diagnoses  Preliminary cause of death:   Multiorgan failure due to septic shock Secondary Diagnoses (including complications and co-morbidities):  Principal Problem:   GI bleed Active Problems:   AF (paroxysmal atrial fibrillation) (HCC)   Myelodysplastic syndrome, unspecified ([place])   AKI (acute kidney injury) ([place])   Acute on chronic heart failure with preserved ejection fraction (HFpEF) (HCC)   Chronic anticoagulation   Melena   Acute on chronic anemia   Gastritis and gastroduodenitis   Brief Hospital Course (including significant findings, care, treatment, and services provided and events leading to death)  Erika [name] is a 80 y.o. year old female who presented to [facility] ER on 01-Apr-2023 with reports of weakness, LE swelling and bloody stool.     She recently had a polypectomy January 27, 2021 and has been on chronic anticoagulation.  After that she noted blood in her stools.  She reported bleeding for one month prior to admit. She reported difficulty walking on admit, decline in her ability to perform self care, shortness of breath.   Initial work  up was concerning for possible GIB with hematochezia.  FOBT positive. On admit, she was noted to have had a 20 lb increase over the last 3 months and she was diuresed.  She had AKI and was diuresed.  UA was concerning for possible UTI.  Renal US was performed & consistent with medical renal disease.  Hgb on presentation was 6.3 requiring transfusion. She was evaluated by GI, anticoagulation was held, PPI IV and serial H/H.  She was pan cultured with blood cultures showing staph epi and GNR.  Urine culture grew out pan-sensitive E-Coli.  She was treated accordingly. She underwent EGD on 8/13 with findings of a normal esophagus, non-ulcerated / non-bleeding gastritis with mild oozing from biopsy sites.  Capsule endoscopy was started on 8/15 with no acute bleeding.  She developed a palpable bulge in her abdomen on 8/17, hypotension and AMS. CT of the abdomen without contrast showed interval marked enlargement of the spleen with foci of parenchymal gas, moderate volume of abdominopelvic ascites.  PCCM called 8/17 for evaluation for hypotension.  She was given IVF for hypotension, glucose for low glucose reading on BMP's. Levophed was added for persistent hypotension.  She was evaluated by General Surgery and felt not to have acute surgical needs.  Follow up imaging of the abdomen with contrast showed persistent enlarged spleen with patchy areas of enhancement, small foci of air and changes consistent with splenic vein thrombosis extending from the splenic hilum to within 3 cm of the spleno portal confluence.  This was reviewed again with surgery and no acute current surgical needs.   Patient progressed to septic shock due to pansensitive E. coli UTI and visible aspiration pneumonia, requiring  increasing doses of vasopressors including Levophed, vasopressin and phenylephrine She was intubated, vent settings were adjusted She had extreme lactic acidosis, she was resuscitated but due to septic shock lactic acid did not  change Patient's serum creatinine started trending up as well as liver enzymes went up, due to shock liver and AKI She was continued on broad-spectrum antibiotics Due to multiorgan failure with septic shock and respiratory failure, family meeting was conducted, patient's family decided to keep her comfortable and made her DNR/DNI.  Patient was kept on comfort care only, she was palliatively extubated on 8/19, she passed peacefully at 5:56 PM on 2021-04-11, family was at bedside    Pertinent Labs and Studies  Significant Diagnostic Studies CT ABDOMEN PELVIS WO CONTRAST  Result Date: 03/22/2021 CLINICAL DATA:  Abdominal distension. EXAM: CT ABDOMEN AND PELVIS WITHOUT CONTRAST TECHNIQUE: Multidetector CT imaging of the abdomen and pelvis was performed following the standard protocol without IV contrast. COMPARISON:  Renal ultrasound, 03/16/2021. Chest radiograph, 03/17/2021. CT abdomen and pelvis, 05/13/2017. FINDINGS: Lower chest: Trace left pleural effusion. Bilateral dependent consolidation, favor atelectasis. Cardiomegaly. Aortic valve and descending thoracic aorta calcifications. Hepatobiliary: Normal noncontrast appearance. Status post cholecystectomy. No biliary dilatation. Pancreas: Atrophic. Spleen: Heterogeneous noncontrast appearance of spleen, with small foci of gas within the parenchyma (see key images. Splenomegaly, with spleen measuring up to 14 cm. Adrenals/Urinary Tract: Normal noncontrast appearance of the adrenal glands and kidneys. No renal calculi, or hydronephrosis. Bladder is unremarkable. Stomach/Bowel: Nonobstructive bowel. Capsule endoscopy camera with the cecum. Nondilated colon. Appendix is difficult to visualize. Vascular/Lymphatic: Aortic atherosclerosis. No enlarged abdominal or pelvic lymph nodes. Reproductive: Calcified leiomyoma. Other: Moderate volume abdominopelvic ascites, new since renal ultrasound 03/16/2021. Musculoskeletal: Anasarca.  No acute osseous abnormality.  IMPRESSION: 1. Interval marked enlargement of spleen, with foci of parenchymal gas. Differential diagnosis includes spontaneous splenic rupture versus superimposed infection. Findings are incompletely assessed on this noncontrast examination, recommend contrasted CT abdomen pelvis for further evaluation. 2. New moderate volume of abdominopelvic ascites. 3. Additional chronic and senescent changes, as above. These results were called by telephone at the time of interpretation on 03/22/2021 at 12:26 pm to patient's RN Elisabeth Cara), to be relayed to provider [facility] , who verbally acknowledged these results. Electronically Signed   By: Michaelle Birks M.D.   On: 03/22/2021 12:32   DG Abd 1 View  Result Date: 03/23/2021 CLINICAL DATA:  Nasogastric tube placement EXAM: ABDOMEN - 1 VIEW COMPARISON:  CT abdomen 03/22/2021 FINDINGS: Nasogastric tube tip and side port are in the stomach body. Centralized bowel loops compatible with ascites. Bibasilar airspace opacities are stable. Capsule endoscope in the right lower quadrant. IMPRESSION: 1. Nasogastric tube side port and tip are in the stomach body. 2. Centralized bowel loops compatible with ascites. 3. Bibasilar airspace opacities. 4. Capsule endoscope in the right lower quadrant. Electronically Signed   By: Van Clines M.D.   On: 03/23/2021 12:13   CT ABDOMEN PELVIS W CONTRAST  Result Date: 03/22/2021 CLINICAL DATA:  Abdominal distension EXAM: CT ABDOMEN AND PELVIS WITH CONTRAST TECHNIQUE: Multidetector CT imaging of the abdomen and pelvis was performed using the standard protocol following bolus administration of intravenous contrast. CONTRAST:  50m OMNIPAQUE IOHEXOL 350 MG/ML SOLN COMPARISON:  CT from earlier in the same day. FINDINGS: Lower chest: Lung bases again demonstrate small bilateral pleural effusions with bilateral lower lobe consolidation slightly increased when compared with the prior exam. No sizable parenchymal nodule is noted. Hepatobiliary:  Liver is well visualized with evidence of decreased attenuation  consistent with fatty infiltration. Mild nodularity is noted suggestive mild cirrhosis. The gallbladder has been surgically removed. Perihepatic fluid is again identified. Pancreas: The pancreas is predominately fatty replaced and atrophic. Spleen: Spleen is again enlarged with multiple foci of air within. Patchy enhancement is noted even on delayed images despite contrast administration. The majority of the spleen is not demonstrate significant enhancement. Splenic vein is occluded from the level of the splenic hilum to within 3 cm of the spleno portal confluence. These changes likely causing origin of the spleen and likely infarction with poor perfusion and areas of air within. The air is highly suggestive of underlying infection. Adrenals/Urinary Tract: Adrenal glands are within normal limits. Kidneys demonstrate a normal enhancement pattern. Delayed images demonstrate no significant enhancement likely related to the known renal failure. No obstructive changes are seen. The bladder is decompressed. Stomach/Bowel: Administered givens capsule lies in the proximal right colon. The appendix is not well visualized although no inflammatory changes are seen. Small bowel and stomach appear within normal limits. Vascular/Lymphatic: Aortic atherosclerosis. No enlarged abdominal or pelvic lymph nodes. Reproductive: Uterus shows calcified uterine fibroid similar to that seen on prior exam. No adnexal mass is noted. Other: Ascites is noted throughout the abdomen similar to that seen on the prior exam. This may be related to the changes in the spleen although evaluation is difficult. Generalized changes of anasarca are noted. Small fat containing anterior abdominal wall hernia is noted just above the umbilicus Musculoskeletal: No acute bony abnormality is noted. IMPRESSION: Persistent enlarged spleen with patchy areas of enhancement following contrast  administration. Small foci of air are noted within. There are changes consistent with splenic vein thrombosis extending from the splenic hilum to within 3 cm of the spleno portal confluence. This is likely the etiology of the enlarged spleen and what appears to be diffuse splenic infarction. Foci of air within suggest underlying infection. Changes of cirrhosis of the liver with mild ascites and changes of anasarca. Bilateral pleural effusions with increasing lower lobe consolidation when compared with the study obtained earlier in the same day. Critical Value/emergent results were called by telephone at the time of interpretation on 03/22/2021 at 6:03 pm to Noe Gens, NP who verbally acknowledged these results. Electronically Signed   By: Inez Catalina M.D.   On: 03/22/2021 18:04   US RENAL  Result Date: 03/16/2021 CLINICAL DATA:  Acute kidney injury EXAM: RENAL / URINARY TRACT ULTRASOUND COMPLETE COMPARISON:  Abdomen ultrasound 02/01/2012, correlation is made with CT abdomen pelvis 05/29/2017 with FINDINGS: Evaluation is somewhat limited due to body habitus, the patient was unable to do left lateral and right lateral decubitus views. Right Kidney: Renal measurements: 11.0 x 3.9 x 3.8 cm = volume: 85 mL. Increased echogenicity. No mass or hydronephrosis visualized. Left Kidney: Renal measurements: 10.1 x 4.4 x 4.1 cm = volume: 96 mL. Increased echogenicity. No mass or hydronephrosis visualized. Bladder: Unable to visualize due to body habitus. Other: None. IMPRESSION: Increased parenchymal echogenicity, as can be seen with medical renal disease. No mass or hydronephrosis visualized. Electronically Signed   By: Merilyn Baba MD   On: 03/16/2021 16:02   Portable Chest x-ray  Result Date: 03/23/2021 CLINICAL DATA:  Intubation. EXAM: PORTABLE CHEST 1 VIEW COMPARISON:  Chest x-ray from yesterday. FINDINGS: Interval intubation with endotracheal tube tip 2.4 cm above the carina. Enteric tube in the stomach.  Unchanged left internal jugular central venous catheter. Unchanged cardiomegaly and pulmonary artery enlargement. Progressive hazy airspace disease at both lung bases.  No pneumothorax. No acute osseous abnormality. IMPRESSION: 1. Appropriately positioned endotracheal and enteric tubes. 2. Progressive bibasilar hazy airspace disease likely reflecting a combination of atelectasis and pleural effusion. Electronically Signed   By: Titus Dubin M.D.   On: 03/23/2021 09:52   DG CHEST PORT 1 VIEW  Result Date: 03/22/2021 CLINICAL DATA:  Displacement of central venous catheter. EXAM: PORTABLE CHEST 1 VIEW COMPARISON:  March 17, 2021. FINDINGS: Stable cardiomegaly. No pneumothorax is noted. Left internal jugular catheter is noted with distal tip in expected position of right atrium. Mild bibasilar subsegmental atelectasis is noted. Bony thorax is unremarkable. IMPRESSION: Left internal jugular catheter is noted with distal tip in expected position of right atrium. Mild bibasilar subsegmental atelectasis is noted. Electronically Signed   By: Marijo Conception M.D.   On: 03/22/2021 18:21   DG CHEST PORT 1 VIEW  Result Date: 03/17/2021 CLINICAL DATA:  Shortness of breath for 3-4 days. EXAM: PORTABLE CHEST 1 VIEW COMPARISON:  Radiograph yesterday FINDINGS: Unchanged cardiomegaly. Stable prominence of the central pulmonary arteries. Stable mediastinal contours with aortic atherosclerosis. No pneumothorax or pleural effusion. No confluent airspace disease. No convincing pulmonary edema. Mild diffuse interstitial opacities arch are stable, favor chronic. Suspected calcified granuloma at the right lung base. No acute osseous abnormalities are seen. IMPRESSION: Unchanged cardiomegaly. No acute findings or change from prior exam. Electronically Signed   By: Keith Rake M.D.   On: 03/17/2021 15:41   DG Chest Port 1 View  Result Date: 03/16/2021 CLINICAL DATA:  Weakness EXAM: PORTABLE CHEST 1 VIEW COMPARISON:   05/27/2020 FINDINGS: Lungs are clear.  No pleural effusion or pneumothorax. Cardiomegaly.  Suspected prominence of the main pulmonary artery. IMPRESSION: Cardiomegaly. No evidence of acute cardiopulmonary disease. Electronically Signed   By: Julian Hy M.D.   On: 03/16/2021 03:53   ECHOCARDIOGRAM COMPLETE  Result Date: 03/16/2021    ECHOCARDIOGRAM REPORT   Patient Name:   KAIZLEIGH BARRIOS [facility] Date of Exam: 03/16/2021 Medical Rec #:  [id number]       Height:       67.0 in Accession #:    [id number]      Weight:       275.0 lb Date of Birth:  [birth year]        BSA:          2.315 m Patient Age:    67 years        BP:           94/49 mmHg Patient Gender: F               HR:           63 bpm. Exam Location:  Inpatient Procedure: 2D Echo, Cardiac Doppler and Color Doppler Indications:    I50.33 Acute on chronic diastolic (congestive) heart failure  History:        Patient has prior history of Echocardiogram examinations, most                 recent 08/21/2019. Arrythmias:Atrial Flutter; Risk                 Factors:Hypertension.  Sonographer:    Jonelle Sidle Dance Referring Phys: [id number] [place]  1. Left ventricular ejection fraction, by estimation, is 60 to 65%. The left ventricle has normal function. The left ventricle has no regional wall motion abnormalities. Left ventricular diastolic parameters were normal. There is the interventricular septum is flattened in systole and diastole, consistent with right ventricular  pressure and volume overload.  2. Right ventricular systolic function is normal. The right ventricular size is mildly enlarged. There is moderately elevated pulmonary artery systolic pressure. The estimated right ventricular systolic pressure is 99991111 mmHg.  3. Left atrial size was moderately dilated.  4. Right atrial size was massively dilated.  5. The mitral valve is abnormal. Mild to moderate mitral valve regurgitation.  6. The tricuspid valve is abnormal. Tricuspid valve  regurgitation is severe.  7. The aortic valve is tricuspid. There is moderate calcification of the aortic valve. There is moderate thickening of the aortic valve. Aortic valve regurgitation is trivial. Moderate aortic valve stenosis. Aortic regurgitation PHT measures 488 msec. Aortic valve area, by VTI measures 1.26 cm. Aortic valve mean gradient measures 16.0 mmHg. Aortic valve Vmax measures 2.84 m/s. DI is 0.44.  8. The inferior vena cava is dilated in size with <50% respiratory variability, suggesting right atrial pressure of 15 mmHg. Comparison(s): Changes from prior study are noted. 08/21/2019: LVEF 55-60%, moderate AS -mean gradient 11 mmHg, DI 0.45. FINDINGS  Left Ventricle: Left ventricular ejection fraction, by estimation, is 60 to 65%. The left ventricle has normal function. The left ventricle has no regional wall motion abnormalities. The left ventricular internal cavity size was normal in size. There is  no left ventricular hypertrophy. The interventricular septum is flattened in systole and diastole, consistent with right ventricular pressure and volume overload. Left ventricular diastolic parameters were normal. Right Ventricle: The right ventricular size is mildly enlarged. No increase in right ventricular wall thickness. Right ventricular systolic function is normal. There is moderately elevated pulmonary artery systolic pressure. The tricuspid regurgitant velocity is 2.88 m/s, and with an assumed right atrial pressure of 15 mmHg, the estimated right ventricular systolic pressure is 99991111 mmHg. Left Atrium: Left atrial size was moderately dilated. Right Atrium: Right atrial size was massively dilated. Pericardium: There is no evidence of pericardial effusion. Mitral Valve: The mitral valve is abnormal. There is mild thickening of the mitral valve leaflet(s). Mild to moderate mitral valve regurgitation. Tricuspid Valve: The tricuspid valve is abnormal. Tricuspid valve regurgitation is severe. The flow  in the hepatic veins is reversed during ventricular systole. Aortic Valve: The aortic valve is tricuspid. There is moderate calcification of the aortic valve. There is moderate thickening of the aortic valve. Aortic valve regurgitation is trivial. Aortic regurgitation PHT measures 488 msec. Moderate aortic stenosis is present. Aortic valve mean gradient measures 16.0 mmHg. Aortic valve peak gradient measures 32.3 mmHg. Aortic valve area, by VTI measures 1.26 cm. Pulmonic Valve: The pulmonic valve was grossly normal. Pulmonic valve regurgitation is trivial. Aorta: The aortic root and ascending aorta are structurally normal, with no evidence of dilitation. Venous: The inferior vena cava is dilated in size with less than 50% respiratory variability, suggesting right atrial pressure of 15 mmHg. IAS/Shunts: There is left bowing of the interatrial septum, suggestive of elevated right atrial pressure. No atrial level shunt detected by color flow Doppler.  LEFT VENTRICLE PLAX 2D LVIDd:         4.80 cm  Diastology LVIDs:         2.70 cm  LV e' medial:    8.49 cm/s LV PW:         1.00 cm  LV E/e' medial:  12.0 LV IVS:        0.70 cm  LV e' lateral:   14.50 cm/s LVOT diam:     1.90 cm  LV E/e' lateral: 7.0 LV SV:  81 LV SV Index:   35 LVOT Area:     2.84 cm  RIGHT VENTRICLE             IVC RV Basal diam:  5.60 cm     IVC diam: 2.80 cm RV Mid diam:    4.20 cm RV S prime:     12.80 cm/s TAPSE (M-mode): 2.8 cm LEFT ATRIUM              Index       RIGHT ATRIUM           Index LA diam:        5.30 cm  2.29 cm/m  RA Area:     45.50 cm LA Vol (A2C):   114.0 ml 49.24 ml/m RA Volume:   223.00 ml 96.31 ml/m LA Vol (A4C):   82.4 ml  35.59 ml/m LA Biplane Vol: 97.6 ml  42.15 ml/m  AORTIC VALVE                    PULMONIC VALVE AV Area (Vmax):    1.14 cm     PR End Diast Vel: 8.07 msec AV Area (Vmean):   1.30 cm AV Area (VTI):     1.26 cm AV Vmax:           284.00 cm/s AV Vmean:          187.000 cm/s AV VTI:             0.641 m AV Peak Grad:      32.3 mmHg AV Mean Grad:      16.0 mmHg LVOT Vmax:         114.00 cm/s LVOT Vmean:        85.600 cm/s LVOT VTI:          0.285 m LVOT/AV VTI ratio: 0.44 AI PHT:            488 msec  AORTA Ao Root diam: 2.50 cm Ao Asc diam:  3.20 cm MITRAL VALVE                TRICUSPID VALVE MV Area (PHT): 2.22 cm     TR Peak grad:   33.2 mmHg MV Decel Time: 341 msec     TR Vmax:        288.00 cm/s MV E velocity: 102.00 cm/s MV A velocity: 102.00 cm/s  SHUNTS MV E/A ratio:  1.00         Systemic VTI:  0.29 m                             Systemic Diam: 1.90 cm Lyman Bishop MD Electronically signed by Lyman Bishop MD Signature Date/Time: 03/16/2021/1:43:29 PM    Final     Microbiology No results found for this or any previous visit (from the past 240 hour(s)).  Lab Basic Metabolic Panel: No results for input(s): NA, K, CL, CO2, GLUCOSE, BUN, CREATININE, CALCIUM, MG, PHOS in the last 168 hours. Liver Function Tests: No results for input(s): AST, ALT, ALKPHOS, BILITOT, PROT, ALBUMIN in the last 168 hours. No results for input(s): LIPASE, AMYLASE in the last 168 hours. No results for input(s): AMMONIA in the last 168 hours. CBC: No results for input(s): WBC, NEUTROABS, HGB, HCT, MCV, PLT in the last 168 hours. Cardiac Enzymes: No results for input(s): CKTOTAL, CKMB, CKMBINDEX, TROPONINI in the last 168 hours. Sepsis Labs: No results for  input(s): PROCALCITON, WBC, LATICACIDVEN in the last 168 hours.  Procedures/Operations     [employer] 04/03/2021, 8:51 AM

## 2021-04-06 NOTE — Progress Notes (Signed)
Chaplain responded to request for family support during extubation/EOL.  Chaplain provided prayer, anointing and spoken psalms, comfort and orientation to family.  Chaplain appreciated RN's compassionate care for pt and support of family.     Luana Shu [id number]    04/19/21 1700  Clinical Encounter Type  Visited With Patient and family together  Visit Type Initial;Patient actively dying  Referral From Nurse  Consult/Referral To Chaplain  Spiritual Encounters  Spiritual Needs Prayer;Grief support  Stress Factors  Patient Stress Factors Major life changes  Family Stress Factors Major life changes

## 2021-04-06 NOTE — Progress Notes (Signed)
Inpatient Rehabilitation Admissions Coordinator   I will sign off at this time due to medical issues.  Danne Baxter, RN, MSN Rehab Admissions Coordinator [phone] 2021/04/19 8:25 AM

## 2021-04-06 DEATH — deceased

## 2021-04-21 ENCOUNTER — Other Ambulatory Visit (HOSPITAL_COMMUNITY): Payer: Self-pay

## 2021-04-24 ENCOUNTER — Ambulatory Visit: Payer: PPO | Admitting: Physician Assistant

## 2021-05-02 ENCOUNTER — Ambulatory Visit: Payer: PPO | Admitting: Hematology

## 2021-05-02 ENCOUNTER — Other Ambulatory Visit: Payer: PPO
